# Patient Record
Sex: Female | Born: 1940
Health system: Southern US, Community
[De-identification: ages and names within clinical notes are randomized; demographics above are authoritative.]

## PROBLEM LIST (undated history)

## (undated) DIAGNOSIS — M1990 Unspecified osteoarthritis, unspecified site: Secondary | ICD-10-CM

## (undated) DIAGNOSIS — I10 Essential (primary) hypertension: Secondary | ICD-10-CM

## (undated) HISTORY — PX: DENTAL RESTORATION/EXTRACTION WITH X-RAY: SHX5796

---

## 2004-09-04 ENCOUNTER — Ambulatory Visit (HOSPITAL_COMMUNITY): Admission: RE | Admit: 2004-09-04 | Discharge: 2004-09-04 | Payer: Self-pay | Admitting: General Surgery

## 2005-04-02 ENCOUNTER — Ambulatory Visit (HOSPITAL_COMMUNITY): Admission: RE | Admit: 2005-04-02 | Discharge: 2005-04-02 | Payer: Self-pay | Admitting: General Surgery

## 2005-10-15 ENCOUNTER — Ambulatory Visit (HOSPITAL_COMMUNITY): Admission: RE | Admit: 2005-10-15 | Discharge: 2005-10-15 | Payer: Self-pay | Admitting: General Surgery

## 2006-03-18 ENCOUNTER — Ambulatory Visit (HOSPITAL_COMMUNITY): Admission: RE | Admit: 2006-03-18 | Discharge: 2006-03-18 | Payer: Self-pay | Admitting: General Surgery

## 2014-09-28 NOTE — Patient Instructions (Signed)
Your procedure is scheduled on:  Tuesday, 10/03/14  Report to [facility] at     1230   AM.  Call this number if you have problems the morning of surgery: [phone]   Remember:   Do not eat or drink   After Midnight.  Take these medicines the morning of surgery with A SIP OF WATER: Ziac and xanax   Do not wear jewelry, make-up or nail polish.  Do not wear lotions, powders, or perfumes. You may wear deodorant.  Do not bring valuables to the hospital.  Contacts, dentures or bridgework may not be worn into surgery.     Patients discharged the day of surgery will not be allowed to drive home.  Name and phone number of your driver: family  Special Instructions: Use eye drops as directed.   Please read over the following fact sheets that you were given: Pain Booklet, Anesthesia Post-op Instructions and Care and Recovery After Surgery    Cataract Surgery  A cataract is a clouding of the lens of the eye. When a lens becomes cloudy, vision is reduced based on the degree and nature of the clouding. Surgery may be needed to improve vision. Surgery removes the cloudy lens and usually replaces it with a substitute lens (intraocular lens, IOL). LET YOUR EYE DOCTOR KNOW ABOUT:  Allergies to food or medicine.   Medicines taken including herbs, eyedrops, over-the-counter medicines, and creams.   Use of steroids (by mouth or creams).   Previous problems with anesthetics or numbing medicine.   History of bleeding problems or blood clots.   Previous surgery.   Other health problems, including diabetes and kidney problems.   Possibility of pregnancy, if this applies.  RISKS AND COMPLICATIONS  Infection.   Inflammation of the eyeball (endophthalmitis) that can spread to both eyes (sympathetic ophthalmia).   Poor wound healing.   If an IOL is inserted, it can later fall out of proper position. This is very uncommon.   Clouding of the part of your eye that holds an IOL in place. This is  called an "after-cataract." These are uncommon, but easily treated.  BEFORE THE PROCEDURE  Do not eat or drink anything except small amounts of water for 8 to 12 before your surgery, or as directed by your caregiver.   Unless you are told otherwise, continue any eyedrops you have been prescribed.   Talk to your primary caregiver about all other medicines that you take (both prescription and non-prescription). In some cases, you may need to stop or change medicines near the time of your surgery. This is most important if you are taking blood-thinning medicine.Do not stop medicines unless you are told to do so.   Arrange for someone to drive you to and from the procedure.   Do not put contact lenses in either eye on the day of your surgery.  PROCEDURE There is more than one method for safely removing a cataract. Your doctor can explain the differences and help determine which is best for you. Phacoemulsification surgery is the most common form of cataract surgery.  An injection is given behind the eye or eyedrops are given to make this a painless procedure.   A small cut (incision) is made on the edge of the clear, dome-shaped surface that covers the front of the eye (cornea).   A tiny probe is painlessly inserted into the eye. This device gives off ultrasound waves that soften and break up the cloudy center of the lens. This  makes it easier for the cloudy lens to be removed by suction.   An IOL may be implanted.   The normal lens of the eye is covered by a clear capsule. Part of that capsule is intentionally left in the eye to support the IOL.   Your surgeon may or may not use stitches to close the incision.  There are other forms of cataract surgery that require a larger incision and stiches to close the eye. This approach is taken in cases where the doctor feels that the cataract cannot be easily removed using phacoemulsification. AFTER THE PROCEDURE  When an IOL is implanted, it  does not need care. It becomes a permanent part of your eye and cannot be seen or felt.   Your doctor will schedule follow-up exams to check on your progress.   Review your other medicines with your doctor to see which can be resumed after surgery.   Use eyedrops or take medicine as prescribed by your doctor.  Document Released: 04/17/2011 Document Reviewed: 04/14/2011 [facility] Patient Information 2012 [place].  PATIENT INSTRUCTIONS POST-ANESTHESIA  IMMEDIATELY FOLLOWING SURGERY:  Do not drive or operate machinery for the first twenty four hours after surgery.  Do not make any important decisions for twenty four hours after surgery or while taking narcotic pain medications or sedatives.  If you develop intractable nausea and vomiting or a severe headache please notify your doctor immediately.  FOLLOW-UP:  Please make an appointment with your surgeon as instructed. You do not need to follow up with anesthesia unless specifically instructed to do so.  WOUND CARE INSTRUCTIONS (if applicable):  Keep a dry clean dressing on the anesthesia/puncture wound site if there is drainage.  Once the wound has quit draining you may leave it open to air.  Generally you should leave the bandage intact for twenty four hours unless there is drainage.  If the epidural site drains for more than 36-48 hours please call the anesthesia department.  QUESTIONS?:  Please feel free to call your physician or the hospital operator if you have any questions, and they will be happy to assist you.

## 2014-09-29 ENCOUNTER — Other Ambulatory Visit: Payer: Self-pay

## 2014-09-29 ENCOUNTER — Encounter (HOSPITAL_COMMUNITY)
Admission: RE | Admit: 2014-09-29 | Discharge: 2014-09-29 | Disposition: A | Discharge status: Home / Self Care | Payer: Medicare Other | Source: Ambulatory Visit | Attending: Ophthalmology | Admitting: Ophthalmology

## 2014-09-29 ENCOUNTER — Encounter (HOSPITAL_COMMUNITY): Payer: Self-pay

## 2014-09-29 DIAGNOSIS — Z01810 Encounter for preprocedural cardiovascular examination: Secondary | ICD-10-CM | POA: Diagnosis not present

## 2014-09-29 DIAGNOSIS — H269 Unspecified cataract: Secondary | ICD-10-CM | POA: Insufficient documentation

## 2014-09-29 DIAGNOSIS — Z01812 Encounter for preprocedural laboratory examination: Secondary | ICD-10-CM | POA: Diagnosis present

## 2014-09-29 HISTORY — DX: Essential (primary) hypertension: I10

## 2014-09-29 LAB — BASIC METABOLIC PANEL
Anion gap: 7 (ref 5–15)
BUN: 12 mg/dL (ref 6–20)
CO2: 30 mmol/L (ref 22–32)
Calcium: 9 mg/dL (ref 8.9–10.3)
Chloride: 98 mmol/L — ABNORMAL LOW (ref 101–111)
Creatinine, Ser: 0.71 mg/dL (ref 0.44–1.00)
GFR calc Af Amer: >60 mL/min (ref >60)
GFR calc non Af Amer: >60 mL/min (ref >60)
Glucose, Bld: 108 mg/dL — ABNORMAL HIGH (ref 65–99)
Potassium: 4.1 mmol/L (ref 3.5–5.1)
Sodium: 135 mmol/L (ref 135–145)

## 2014-09-29 LAB — CBC
HCT: 42 % (ref 36.0–46.0)
Hemoglobin: 14.1 g/dL (ref 12.0–15.0)
MCH: 30.5 pg (ref 26.0–34.0)
MCHC: 33.6 g/dL (ref 30.0–36.0)
MCV: 90.9 fL (ref 78.0–100.0)
Platelets: 177 10*3/uL (ref 150–400)
RBC: 4.62 MIL/uL (ref 3.87–5.11)
RDW: 12.5 % (ref 11.5–15.5)
WBC: 4.9 10*3/uL (ref 4.0–10.5)

## 2014-10-02 MED ORDER — KETOROLAC TROMETHAMINE 0.5 % OP SOLN
OPHTHALMIC | Status: AC
  Filled 2014-10-02: qty 5

## 2014-10-02 MED ORDER — LIDOCAINE HCL 3.5 % OP GEL: 1.0000 "application " | Freq: Once | OPHTHALMIC | Status: DC

## 2014-10-02 MED ORDER — TETRACAINE HCL 0.5 % OP SOLN
OPHTHALMIC | Status: AC
  Filled 2014-10-02: qty 2

## 2014-10-02 MED ORDER — CYCLOPENTOLATE-PHENYLEPHRINE OP SOLN OPTIME - NO CHARGE
OPHTHALMIC | Status: AC
  Filled 2014-10-02: qty 2

## 2014-10-02 MED ORDER — PHENYLEPHRINE HCL 2.5 % OP SOLN
OPHTHALMIC | Status: AC
  Filled 2014-10-02: qty 15

## 2014-10-03 ENCOUNTER — Ambulatory Visit (HOSPITAL_COMMUNITY): Payer: Medicare Other | Admitting: Anesthesiology

## 2014-10-03 ENCOUNTER — Ambulatory Visit (HOSPITAL_COMMUNITY)
Admission: RE | Admit: 2014-10-03 | Discharge: 2014-10-03 | Disposition: A | Discharge status: Home / Self Care | Payer: Medicare Other | Source: Ambulatory Visit | Attending: Ophthalmology | Admitting: Ophthalmology

## 2014-10-03 ENCOUNTER — Encounter (HOSPITAL_COMMUNITY)
Admission: RE | Disposition: A | Discharge status: Home / Self Care | Payer: Self-pay | Source: Ambulatory Visit | Attending: Ophthalmology

## 2014-10-03 ENCOUNTER — Encounter (HOSPITAL_COMMUNITY): Payer: Self-pay | Admitting: *Deleted

## 2014-10-03 DIAGNOSIS — K219 Gastro-esophageal reflux disease without esophagitis: Secondary | ICD-10-CM | POA: Insufficient documentation

## 2014-10-03 DIAGNOSIS — H25011 Cortical age-related cataract, right eye: Secondary | ICD-10-CM | POA: Diagnosis not present

## 2014-10-03 DIAGNOSIS — M1990 Unspecified osteoarthritis, unspecified site: Secondary | ICD-10-CM | POA: Insufficient documentation

## 2014-10-03 DIAGNOSIS — H2511 Age-related nuclear cataract, right eye: Secondary | ICD-10-CM | POA: Diagnosis not present

## 2014-10-03 DIAGNOSIS — Z79899 Other long term (current) drug therapy: Secondary | ICD-10-CM | POA: Diagnosis not present

## 2014-10-03 DIAGNOSIS — I10 Essential (primary) hypertension: Secondary | ICD-10-CM | POA: Diagnosis not present

## 2014-10-03 HISTORY — PX: CATARACT EXTRACTION W/PHACO: SHX586

## 2014-10-03 SURGERY — PHACOEMULSIFICATION, CATARACT, WITH IOL INSERTION
Anesthesia: Monitor Anesthesia Care | Site: Eye | Laterality: Right

## 2014-10-03 MED ORDER — BSS IO SOLN
INTRAOCULAR | Status: DC | PRN
  Administered 2014-10-03: 15 mL

## 2014-10-03 MED ORDER — MIDAZOLAM HCL 2 MG/2ML IJ SOLN
1.0000 mg | INTRAMUSCULAR | Status: DC | PRN
  Administered 2014-10-03: 2 mg via INTRAVENOUS

## 2014-10-03 MED ORDER — CYCLOPENTOLATE-PHENYLEPHRINE 0.2-1 % OP SOLN
1.0000 [drp] | OPHTHALMIC | Status: AC
  Administered 2014-10-03 (×3): 1 [drp] via OPHTHALMIC

## 2014-10-03 MED ORDER — MIDAZOLAM HCL 2 MG/2ML IJ SOLN
INTRAMUSCULAR | Status: AC
  Filled 2014-10-03: qty 2

## 2014-10-03 MED ORDER — TETRACAINE 0.5 % OP SOLN OPTIME - NO CHARGE
OPHTHALMIC | Status: DC | PRN
  Administered 2014-10-03: 1 [drp] via OPHTHALMIC

## 2014-10-03 MED ORDER — LACTATED RINGERS IV SOLN
INTRAVENOUS | Status: DC
  Administered 2014-10-03: 08:00:00 via INTRAVENOUS

## 2014-10-03 MED ORDER — FENTANYL CITRATE (PF) 100 MCG/2ML IJ SOLN
INTRAMUSCULAR | Status: AC
  Filled 2014-10-03: qty 2

## 2014-10-03 MED ORDER — FENTANYL CITRATE (PF) 100 MCG/2ML IJ SOLN
25.0000 ug | INTRAMUSCULAR | Status: AC
  Administered 2014-10-03 (×2): 25 ug via INTRAVENOUS

## 2014-10-03 MED ORDER — EPINEPHRINE HCL 1 MG/ML IJ SOLN
INTRAMUSCULAR | Status: AC
  Filled 2014-10-03: qty 1

## 2014-10-03 MED ORDER — PROVISC 10 MG/ML IO SOLN
INTRAOCULAR | Status: DC | PRN
  Administered 2014-10-03: 0.85 mL via INTRAOCULAR

## 2014-10-03 MED ORDER — KETOROLAC TROMETHAMINE 0.5 % OP SOLN
1.0000 [drp] | OPHTHALMIC | Status: AC
  Administered 2014-10-03 (×3): 1 [drp] via OPHTHALMIC

## 2014-10-03 MED ORDER — EPINEPHRINE HCL 1 MG/ML IJ SOLN
INTRAOCULAR | Status: DC | PRN
  Administered 2014-10-03: 500 mL

## 2014-10-03 MED ORDER — MIDAZOLAM HCL 5 MG/5ML IJ SOLN
INTRAMUSCULAR | Status: DC | PRN
  Administered 2014-10-03: 1 mg via INTRAVENOUS
  Administered 2014-10-03: .5 mg via INTRAVENOUS

## 2014-10-03 MED ORDER — TETRACAINE HCL 0.5 % OP SOLN
1.0000 [drp] | OPHTHALMIC | Status: AC
  Administered 2014-10-03 (×3): 1 [drp] via OPHTHALMIC

## 2014-10-03 MED ORDER — PHENYLEPHRINE HCL 2.5 % OP SOLN
1.0000 [drp] | OPHTHALMIC | Status: AC
Start: 2014-10-03 — End: 2014-10-03
  Administered 2014-10-03 (×3): 1 [drp] via OPHTHALMIC

## 2014-10-03 SURGICAL SUPPLY — 10 items
CLOTH BEACON ORANGE TIMEOUT ST (SAFETY) ×2 IMPLANT
EYE SHIELD UNIVERSAL CLEAR (GAUZE/BANDAGES/DRESSINGS) ×2 IMPLANT
GLOVE BIO SURGEON STRL SZ 6.5 (GLOVE) ×1 IMPLANT
GLOVE BIO SURGEONS STRL SZ 6.5 (GLOVE) ×1
GLOVE EXAM NITRILE MD LF STRL (GLOVE) ×2 IMPLANT
LENS ALC ACRYL/TECN (Ophthalmic Related) ×3 IMPLANT
PAD ARMBOARD 7.5X6 YLW CONV (MISCELLANEOUS) ×2 IMPLANT
TAPE SURG TRANSPORE 1 IN (GAUZE/BANDAGES/DRESSINGS) IMPLANT
TAPE SURGICAL TRANSPORE 1 IN (GAUZE/BANDAGES/DRESSINGS) ×2
WATER STERILE IRR 250ML POUR (IV SOLUTION) ×2 IMPLANT

## 2014-10-03 NOTE — H&P (Signed)
The patient was re examined and there is no change in the patients condition since the original H and P. 

## 2014-10-03 NOTE — Transfer of Care (Signed)
Immediate Anesthesia Transfer of Care Note  Patient: Elizabeth Norman  Procedure(s) Performed: Procedure(s) with comments: CATARACT EXTRACTION PHACO AND INTRAOCULAR LENS PLACEMENT (IOC) (Right) - CDE:10.09  Patient Location: Short Stay  Anesthesia Type:MAC  Level of Consciousness: awake  Airway & Oxygen Therapy: Patient Spontanous Breathing  Post-op Assessment: Report given to RN  Post vital signs: Reviewed  Last Vitals:  Filed Vitals:   10/03/14 0741  BP: 131/73  Pulse: 68  Temp: 95.3 C    Complications: No apparent anesthesia complications

## 2014-10-03 NOTE — Discharge Instructions (Signed)
Elizabeth Norman  10/03/2014           [facility] Instructions Roundup [address]      1. Avoid closing eyes tightly. One often closes the eye tightly when laughing, talking, sneezing, coughing or if they feel irritated. At these times, you should be careful not to close your eyes tightly.  2. Instill eye drops as instructed. To instill drops in your eye, open it, look up and have someone gently pull the lower lid down and instill a couple of drops inside the lower lid.  3. Do not touch upper lid.  4. Take Advil or Tylenol for pain.  5. You may use either eye for near work, such as reading or sewing and you may watch television.  6. You may have your hair done at the beauty parlor at any time.  7. Wear dark glasses with or without your own glasses if you are in bright light.  8. Call our office at [phone] or [phone] if you have sharp pain in your eye or unusual symptoms.  9. Do not be concerned because vision in the operative eye is not good. It will not be good, no matter how successful the operation, until you get a special lens for it. Your old glasses will not be suited to the new eye that was operated on and you will not be ready for a new lens for about a month.  10. Follow up at the [facility] office.    I have received a copy of the above instructions and will follow them.        PATIENT INSTRUCTIONS POST-ANESTHESIA  IMMEDIATELY FOLLOWING SURGERY:  Do not drive or operate machinery for the first twenty four hours after surgery.  Do not make any important decisions for twenty four hours after surgery or while taking narcotic pain medications or sedatives.  If you develop intractable nausea and vomiting or a severe headache please notify your doctor immediately.  FOLLOW-UP:  Please make an appointment with your surgeon as instructed. You do not need to follow up with anesthesia unless specifically instructed to do  so.  WOUND CARE INSTRUCTIONS (if applicable):  Keep a dry clean dressing on the anesthesia/puncture wound site if there is drainage.  Once the wound has quit draining you may leave it open to air.  Generally you should leave the bandage intact for twenty four hours unless there is drainage.  If the epidural site drains for more than 36-48 hours please call the anesthesia department.  QUESTIONS?:  Please feel free to call your physician or the hospital operator if you have any questions, and they will be happy to assist you.

## 2014-10-03 NOTE — Anesthesia Preprocedure Evaluation (Signed)
Anesthesia Evaluation  Patient identified by MRN, date of birth, ID band Patient awake    Reviewed: Allergy & Precautions, NPO status , Patient's Chart, lab work & pertinent test results  Airway Mallampati: II  TM Distance: >3 FB     Dental  (+) Edentulous Upper   Pulmonary neg pulmonary ROS,  breath sounds clear to auscultation        Cardiovascular hypertension, Pt. on medications Rhythm:Regular Rate:Normal     Neuro/Psych Anxiety    GI/Hepatic   Endo/Other    Renal/GU      Musculoskeletal   Abdominal   Peds  Hematology   Anesthesia Other Findings   Reproductive/Obstetrics                             Anesthesia Physical Anesthesia Plan  ASA: II  Anesthesia Plan: MAC   Post-op Pain Management:    Induction: Intravenous  Airway Management Planned: Nasal Cannula  Additional Equipment:   Intra-op Plan:   Post-operative Plan:   Informed Consent: I have reviewed the patients History and Physical, chart, labs and discussed the procedure including the risks, benefits and alternatives for the proposed anesthesia with the patient or authorized representative who has indicated his/her understanding and acceptance.     Plan Discussed with:   Anesthesia Plan Comments:         Anesthesia Quick Evaluation

## 2014-10-03 NOTE — Anesthesia Postprocedure Evaluation (Signed)
  Anesthesia Post-op Note  Patient: Elizabeth Norman  Procedure(s) Performed: Procedure(s) with comments: CATARACT EXTRACTION PHACO AND INTRAOCULAR LENS PLACEMENT (IOC) (Right) - CDE:10.09  Patient Location: Short Stay  Anesthesia Type:MAC  Level of Consciousness: awake, alert  and oriented  Airway and Oxygen Therapy: Patient Spontanous Breathing  Post-op Pain: none  Post-op Assessment: Post-op Vital signs reviewed, Patient's Cardiovascular Status Stable, Respiratory Function Stable, Patent Airway and No signs of Nausea or vomiting  Post-op Vital Signs: Reviewed and stable  Last Vitals:  Filed Vitals:   10/03/14 0741  BP: 131/73  Pulse: 68  Temp: 32.4 C    Complications: No apparent anesthesia complications

## 2014-10-03 NOTE — Op Note (Signed)
Patient brought to the operating room and prepped and draped in the usual manner.  Lid speculum inserted in right eye.  Stab incision made at the twelve o'clock position.  Provisc instilled in the anterior chamber.   A 2.4 mm. Stab incision was made temporally.  An anterior capsulotomy was done with a bent 25 gauge needle.  The nucleus was hydrodissected.  The Phaco tip was inserted in the anterior chamber and the nucleus was emulsified.  CDE was 10.09.  The cortical material was then removed with the I and A tip.  Posterior capsule was the polished.  The anterior chamber was deepened with Provisc.  A 21.0 Diopter Alcon [id number] IOL was then inserted in the capsular bag.  Provisc was then removed with the I and A tip.  The wound was then hydrated.  Patient sent to the Recovery Room in good condition with follow up in my office.  Preoperative Diagnosis:  Nuclear Cataract OD Postoperative Diagnosis:  Same Procedure name: Kelman Phacoemulsification OD with IOL

## 2014-10-04 ENCOUNTER — Encounter (HOSPITAL_COMMUNITY): Payer: Self-pay | Admitting: Ophthalmology

## 2014-10-05 ENCOUNTER — Encounter (HOSPITAL_COMMUNITY): Payer: Self-pay

## 2014-10-05 ENCOUNTER — Encounter (HOSPITAL_COMMUNITY)
Admission: RE | Admit: 2014-10-05 | Discharge: 2014-10-05 | Disposition: A | Discharge status: Home / Self Care | Payer: Medicare Other | Source: Ambulatory Visit | Attending: Ophthalmology | Admitting: Ophthalmology

## 2014-10-06 ENCOUNTER — Encounter (HOSPITAL_COMMUNITY)
Admission: RE | Admit: 2014-10-06 | Discharge: 2014-10-06 | Disposition: A | Discharge status: Home / Self Care | Payer: Medicare Other | Source: Ambulatory Visit | Attending: Ophthalmology | Admitting: Ophthalmology

## 2014-10-10 ENCOUNTER — Ambulatory Visit (HOSPITAL_COMMUNITY): Payer: Medicare Other | Admitting: Anesthesiology

## 2014-10-10 ENCOUNTER — Encounter (HOSPITAL_COMMUNITY): Payer: Self-pay | Admitting: *Deleted

## 2014-10-10 ENCOUNTER — Encounter (HOSPITAL_COMMUNITY)
Admission: RE | Disposition: A | Discharge status: Home / Self Care | Payer: Self-pay | Source: Ambulatory Visit | Attending: Ophthalmology

## 2014-10-10 ENCOUNTER — Ambulatory Visit (HOSPITAL_COMMUNITY)
Admission: RE | Admit: 2014-10-10 | Discharge: 2014-10-10 | Disposition: A | Discharge status: Home / Self Care | Payer: Medicare Other | Source: Ambulatory Visit | Attending: Ophthalmology | Admitting: Ophthalmology

## 2014-10-10 DIAGNOSIS — H2512 Age-related nuclear cataract, left eye: Secondary | ICD-10-CM | POA: Insufficient documentation

## 2014-10-10 HISTORY — PX: CATARACT EXTRACTION W/PHACO: SHX586

## 2014-10-10 SURGERY — PHACOEMULSIFICATION, CATARACT, WITH IOL INSERTION
Anesthesia: Monitor Anesthesia Care | Site: Eye | Laterality: Left

## 2014-10-10 MED ORDER — PHENYLEPHRINE HCL 2.5 % OP SOLN
1.0000 [drp] | OPHTHALMIC | Status: AC
  Administered 2014-10-10 (×3): 1 [drp] via OPHTHALMIC

## 2014-10-10 MED ORDER — MIDAZOLAM HCL 2 MG/2ML IJ SOLN
1.0000 mg | INTRAMUSCULAR | Status: DC | PRN
  Administered 2014-10-10 (×2): 2 mg via INTRAVENOUS
  Filled 2014-10-10: qty 2

## 2014-10-10 MED ORDER — TETRACAINE HCL 0.5 % OP SOLN
1.0000 [drp] | OPHTHALMIC | Status: AC
  Administered 2014-10-10 (×3): 1 [drp] via OPHTHALMIC

## 2014-10-10 MED ORDER — MIDAZOLAM HCL 2 MG/2ML IJ SOLN
INTRAMUSCULAR | Status: AC
  Filled 2014-10-10: qty 2

## 2014-10-10 MED ORDER — TETRACAINE 0.5 % OP SOLN OPTIME - NO CHARGE
OPHTHALMIC | Status: DC | PRN
  Administered 2014-10-10: 1 [drp] via OPHTHALMIC

## 2014-10-10 MED ORDER — KETOROLAC TROMETHAMINE 0.5 % OP SOLN
1.0000 [drp] | OPHTHALMIC | Status: AC
  Administered 2014-10-10 (×3): 1 [drp] via OPHTHALMIC

## 2014-10-10 MED ORDER — CYCLOPENTOLATE-PHENYLEPHRINE 0.2-1 % OP SOLN
1.0000 [drp] | OPHTHALMIC | Status: AC
  Administered 2014-10-10 (×3): 1 [drp] via OPHTHALMIC

## 2014-10-10 MED ORDER — PROVISC 10 MG/ML IO SOLN
INTRAOCULAR | Status: DC | PRN
  Administered 2014-10-10: 0.85 mL via INTRAOCULAR

## 2014-10-10 MED ORDER — FENTANYL CITRATE (PF) 100 MCG/2ML IJ SOLN
INTRAMUSCULAR | Status: AC
Start: 2014-10-10 — End: 2014-10-10
  Filled 2014-10-10: qty 2

## 2014-10-10 MED ORDER — EPINEPHRINE HCL 1 MG/ML IJ SOLN
INTRAOCULAR | Status: DC | PRN
  Administered 2014-10-10: 500 mL

## 2014-10-10 MED ORDER — LACTATED RINGERS IV SOLN
INTRAVENOUS | Status: DC
  Administered 2014-10-10: 1000 mL via INTRAVENOUS

## 2014-10-10 MED ORDER — FENTANYL CITRATE (PF) 100 MCG/2ML IJ SOLN
25.0000 ug | INTRAMUSCULAR | Status: AC
  Administered 2014-10-10 (×2): 25 ug via INTRAVENOUS

## 2014-10-10 MED ORDER — LIDOCAINE HCL (PF) 1 % IJ SOLN
INTRAMUSCULAR | Status: AC
  Filled 2014-10-10: qty 2

## 2014-10-10 MED ORDER — EPINEPHRINE HCL 1 MG/ML IJ SOLN
INTRAMUSCULAR | Status: AC
  Filled 2014-10-10: qty 1

## 2014-10-10 MED ORDER — BSS IO SOLN
INTRAOCULAR | Status: DC | PRN
  Administered 2014-10-10: 15 mL

## 2014-10-10 SURGICAL SUPPLY — 10 items
CLOTH BEACON ORANGE TIMEOUT ST (SAFETY) ×2 IMPLANT
EYE SHIELD UNIVERSAL CLEAR (GAUZE/BANDAGES/DRESSINGS) ×2 IMPLANT
GLOVE BIO SURGEON STRL SZ 6.5 (GLOVE) ×1 IMPLANT
GLOVE BIO SURGEONS STRL SZ 6.5 (GLOVE) ×1
GLOVE EXAM NITRILE MD LF STRL (GLOVE) ×2 IMPLANT
LENS ALC ACRYL/TECN (Ophthalmic Related) ×3 IMPLANT
PAD ARMBOARD 7.5X6 YLW CONV (MISCELLANEOUS) ×2 IMPLANT
TAPE SURG TRANSPORE 1 IN (GAUZE/BANDAGES/DRESSINGS) IMPLANT
TAPE SURGICAL TRANSPORE 1 IN (GAUZE/BANDAGES/DRESSINGS) ×2
WATER STERILE IRR 250ML POUR (IV SOLUTION) ×2 IMPLANT

## 2014-10-10 NOTE — Discharge Instructions (Signed)
Elizabeth Norman  10/10/2014           [facility] Instructions [place] [address]      1. Avoid closing eyes tightly. One often closes the eye tightly when laughing, talking, sneezing, coughing or if they feel irritated. At these times, you should be careful not to close your eyes tightly.  2. Instill eye drops as instructed. To instill drops in your eye, open it, look up and have someone gently pull the lower lid down and instill a couple of drops inside the lower lid.  3. Do not touch upper lid.  4. Take Advil or Tylenol for pain.  5. You may use either eye for near work, such as reading or sewing and you may watch television.  6. You may have your hair done at the beauty parlor at any time.  7. Wear dark glasses with or without your own glasses if you are in bright light.  8. Call our office at [phone] or [phone] if you have sharp pain in your eye or unusual symptoms.  9. Do not be concerned because vision in the operative eye is not good. It will not be good, no matter how successful the operation, until you get a special lens for it. Your old glasses will not be suited to the new eye that was operated on and you will not be ready for a new lens for about a month.  10. Follow up at the [facility] office.    I have received a copy of the above instructions and will follow them.

## 2014-10-10 NOTE — Addendum Note (Signed)
Addendum  created 10/10/14 0803 by Ollen Bowl, CRNA   Modules edited: Anesthesia Attestations

## 2014-10-10 NOTE — Anesthesia Postprocedure Evaluation (Signed)
  Anesthesia Post-op Note  Patient: Elizabeth Norman  Procedure(s) Performed: Procedure(s) with comments: CATARACT EXTRACTION PHACO AND INTRAOCULAR LENS PLACEMENT (IOC) (Left) - CDE:8.69  Patient Location: PACU  Anesthesia Type:MAC  Level of Consciousness: awake, alert  and oriented  Airway and Oxygen Therapy: Patient Spontanous Breathing  Post-op Pain: none  Post-op Assessment: Post-op Vital signs reviewed, Patient's Cardiovascular Status Stable, Respiratory Function Stable, Patent Airway and No signs of Nausea or vomiting  Post-op Vital Signs: Reviewed and stable  Last Vitals:  Filed Vitals:   10/10/14 0720  BP: 109/57  Resp: 12    Complications: No apparent anesthesia complications

## 2014-10-10 NOTE — Transfer of Care (Signed)
Immediate Anesthesia Transfer of Care Note  Patient: Elizabeth Norman  Procedure(s) Performed: Procedure(s) with comments: CATARACT EXTRACTION PHACO AND INTRAOCULAR LENS PLACEMENT (IOC) (Left) - CDE:8.69  Patient Location: Short Stay  Anesthesia Type:MAC  Level of Consciousness: awake  Airway & Oxygen Therapy: Patient Spontanous Breathing  Post-op Assessment: Report given to RN  Post vital signs: Reviewed  Last Vitals:  Filed Vitals:   10/10/14 0720  BP: 109/57  Resp: 12    Complications: No apparent anesthesia complications

## 2014-10-10 NOTE — Op Note (Signed)
Patient brought to the operating room and prepped and draped in the usual manner.  Lid speculum inserted in left eye.  Stab incision made at the twelve o'clock position.  Provisc instilled in the anterior chamber.   A 2.4 mm. Stab incision was made temporally.  An anterior capsulotomy was done with a bent 25 gauge needle.  The nucleus was hydrodissected.  The Phaco tip was inserted in the anterior chamber and the nucleus was emulsified.  CDE was 8.69.  The cortical material was then removed with the I and A tip.  Posterior capsule was the polished.  The anterior chamber was deepened with Provisc.  A 21.0 Diopter Alcon SN60WF IOL was then inserted in the capsular bag.  Provisc was then removed with the I and A tip.  The wound was then hydrated.  Patient sent to the Recovery Room in good condition with follow up in my office.  Preoperative Diagnosis:  Nuclear Cataract OS Postoperative Diagnosis:  Same Procedure name: Kelman Phacoemulsification OS with IOL

## 2014-10-10 NOTE — H&P (Signed)
The patient was re examined and there is no change in the patients condition since the original H and P. 

## 2014-10-10 NOTE — Anesthesia Preprocedure Evaluation (Signed)
Anesthesia Evaluation  Patient identified by MRN, date of birth, ID band Patient awake    Reviewed: Allergy & Precautions, NPO status , Patient's Chart, lab work & pertinent test results  Airway Mallampati: II  TM Distance: >3 FB     Dental  (+) Edentulous Upper   Pulmonary neg pulmonary ROS,  breath sounds clear to auscultation        Cardiovascular hypertension, Pt. on medications Rhythm:Regular Rate:Normal     Neuro/Psych Anxiety    GI/Hepatic   Endo/Other    Renal/GU      Musculoskeletal   Abdominal   Peds  Hematology   Anesthesia Other Findings   Reproductive/Obstetrics                             Anesthesia Physical Anesthesia Plan  ASA: II  Anesthesia Plan: MAC   Post-op Pain Management:    Induction: Intravenous  Airway Management Planned: Nasal Cannula  Additional Equipment:   Intra-op Plan:   Post-operative Plan:   Informed Consent: I have reviewed the patients History and Physical, chart, labs and discussed the procedure including the risks, benefits and alternatives for the proposed anesthesia with the patient or authorized representative who has indicated his/her understanding and acceptance.     Plan Discussed with:   Anesthesia Plan Comments:         Anesthesia Quick Evaluation

## 2014-10-11 ENCOUNTER — Encounter (HOSPITAL_COMMUNITY): Payer: Self-pay | Admitting: Ophthalmology

## 2014-11-03 ENCOUNTER — Other Ambulatory Visit (HOSPITAL_COMMUNITY): Payer: Self-pay

## 2015-05-22 DIAGNOSIS — Z0000 Encounter for general adult medical examination without abnormal findings: Secondary | ICD-10-CM | POA: Diagnosis not present

## 2015-05-22 DIAGNOSIS — Z1389 Encounter for screening for other disorder: Secondary | ICD-10-CM | POA: Diagnosis not present

## 2015-05-22 DIAGNOSIS — I10 Essential (primary) hypertension: Secondary | ICD-10-CM | POA: Diagnosis not present

## 2015-05-22 DIAGNOSIS — M810 Age-related osteoporosis without current pathological fracture: Secondary | ICD-10-CM | POA: Diagnosis not present

## 2015-06-01 DIAGNOSIS — Z1231 Encounter for screening mammogram for malignant neoplasm of breast: Secondary | ICD-10-CM | POA: Diagnosis not present

## 2015-08-27 DIAGNOSIS — M810 Age-related osteoporosis without current pathological fracture: Secondary | ICD-10-CM | POA: Diagnosis not present

## 2015-08-27 DIAGNOSIS — Z0000 Encounter for general adult medical examination without abnormal findings: Secondary | ICD-10-CM | POA: Diagnosis not present

## 2015-08-27 DIAGNOSIS — I10 Essential (primary) hypertension: Secondary | ICD-10-CM | POA: Diagnosis not present

## 2015-11-27 DIAGNOSIS — Z6834 Body mass index (BMI) 34.0-34.9, adult: Secondary | ICD-10-CM | POA: Diagnosis not present

## 2015-11-27 DIAGNOSIS — Z131 Encounter for screening for diabetes mellitus: Secondary | ICD-10-CM | POA: Diagnosis not present

## 2015-11-27 DIAGNOSIS — M80852D Other osteoporosis with current pathological fracture, left femur, subsequent encounter for fracture with routine healing: Secondary | ICD-10-CM | POA: Diagnosis not present

## 2015-11-27 DIAGNOSIS — I10 Essential (primary) hypertension: Secondary | ICD-10-CM | POA: Diagnosis not present

## 2015-11-27 DIAGNOSIS — M810 Age-related osteoporosis without current pathological fracture: Secondary | ICD-10-CM | POA: Diagnosis not present

## 2016-03-04 DIAGNOSIS — M542 Cervicalgia: Secondary | ICD-10-CM | POA: Diagnosis not present

## 2016-03-04 DIAGNOSIS — M810 Age-related osteoporosis without current pathological fracture: Secondary | ICD-10-CM | POA: Diagnosis not present

## 2016-03-04 DIAGNOSIS — I10 Essential (primary) hypertension: Secondary | ICD-10-CM | POA: Diagnosis not present

## 2016-03-04 DIAGNOSIS — M47812 Spondylosis without myelopathy or radiculopathy, cervical region: Secondary | ICD-10-CM | POA: Diagnosis not present

## 2016-05-02 DIAGNOSIS — I10 Essential (primary) hypertension: Secondary | ICD-10-CM | POA: Diagnosis not present

## 2016-05-02 DIAGNOSIS — M810 Age-related osteoporosis without current pathological fracture: Secondary | ICD-10-CM | POA: Diagnosis not present

## 2016-06-03 DIAGNOSIS — M810 Age-related osteoporosis without current pathological fracture: Secondary | ICD-10-CM | POA: Diagnosis not present

## 2016-06-03 DIAGNOSIS — I10 Essential (primary) hypertension: Secondary | ICD-10-CM | POA: Diagnosis not present

## 2016-06-03 DIAGNOSIS — Z1389 Encounter for screening for other disorder: Secondary | ICD-10-CM | POA: Diagnosis not present

## 2016-06-03 DIAGNOSIS — Z0000 Encounter for general adult medical examination without abnormal findings: Secondary | ICD-10-CM | POA: Diagnosis not present

## 2016-06-06 DIAGNOSIS — Z1231 Encounter for screening mammogram for malignant neoplasm of breast: Secondary | ICD-10-CM | POA: Diagnosis not present

## 2016-09-02 DIAGNOSIS — Z0000 Encounter for general adult medical examination without abnormal findings: Secondary | ICD-10-CM | POA: Diagnosis not present

## 2016-09-02 DIAGNOSIS — M818 Other osteoporosis without current pathological fracture: Secondary | ICD-10-CM | POA: Diagnosis not present

## 2016-09-02 DIAGNOSIS — I10 Essential (primary) hypertension: Secondary | ICD-10-CM | POA: Diagnosis not present

## 2016-09-24 DIAGNOSIS — M810 Age-related osteoporosis without current pathological fracture: Secondary | ICD-10-CM | POA: Diagnosis not present

## 2016-09-24 DIAGNOSIS — I10 Essential (primary) hypertension: Secondary | ICD-10-CM | POA: Diagnosis not present

## 2016-09-29 DIAGNOSIS — M8588 Other specified disorders of bone density and structure, other site: Secondary | ICD-10-CM | POA: Diagnosis not present

## 2016-09-29 DIAGNOSIS — M810 Age-related osteoporosis without current pathological fracture: Secondary | ICD-10-CM | POA: Diagnosis not present

## 2016-12-02 DIAGNOSIS — I10 Essential (primary) hypertension: Secondary | ICD-10-CM | POA: Diagnosis not present

## 2016-12-02 DIAGNOSIS — M818 Other osteoporosis without current pathological fracture: Secondary | ICD-10-CM | POA: Diagnosis not present

## 2016-12-02 DIAGNOSIS — M810 Age-related osteoporosis without current pathological fracture: Secondary | ICD-10-CM | POA: Diagnosis not present

## 2017-03-03 DIAGNOSIS — M810 Age-related osteoporosis without current pathological fracture: Secondary | ICD-10-CM | POA: Diagnosis not present

## 2017-03-03 DIAGNOSIS — I10 Essential (primary) hypertension: Secondary | ICD-10-CM | POA: Diagnosis not present

## 2017-03-10 DIAGNOSIS — M810 Age-related osteoporosis without current pathological fracture: Secondary | ICD-10-CM | POA: Diagnosis not present

## 2017-03-10 DIAGNOSIS — I10 Essential (primary) hypertension: Secondary | ICD-10-CM | POA: Diagnosis not present

## 2017-06-02 DIAGNOSIS — Z0000 Encounter for general adult medical examination without abnormal findings: Secondary | ICD-10-CM | POA: Diagnosis not present

## 2017-06-02 DIAGNOSIS — M810 Age-related osteoporosis without current pathological fracture: Secondary | ICD-10-CM | POA: Diagnosis not present

## 2017-06-02 DIAGNOSIS — I10 Essential (primary) hypertension: Secondary | ICD-10-CM | POA: Diagnosis not present

## 2017-06-02 DIAGNOSIS — Z1389 Encounter for screening for other disorder: Secondary | ICD-10-CM | POA: Diagnosis not present

## 2017-06-09 DIAGNOSIS — Z1231 Encounter for screening mammogram for malignant neoplasm of breast: Secondary | ICD-10-CM | POA: Diagnosis not present

## 2017-08-31 DIAGNOSIS — I10 Essential (primary) hypertension: Secondary | ICD-10-CM | POA: Diagnosis not present

## 2017-08-31 DIAGNOSIS — M810 Age-related osteoporosis without current pathological fracture: Secondary | ICD-10-CM | POA: Diagnosis not present

## 2017-11-30 DIAGNOSIS — I10 Essential (primary) hypertension: Secondary | ICD-10-CM | POA: Diagnosis not present

## 2017-11-30 DIAGNOSIS — M810 Age-related osteoporosis without current pathological fracture: Secondary | ICD-10-CM | POA: Diagnosis not present

## 2017-11-30 DIAGNOSIS — Z0000 Encounter for general adult medical examination without abnormal findings: Secondary | ICD-10-CM | POA: Diagnosis not present

## 2018-03-02 DIAGNOSIS — I10 Essential (primary) hypertension: Secondary | ICD-10-CM | POA: Diagnosis not present

## 2018-03-02 DIAGNOSIS — M810 Age-related osteoporosis without current pathological fracture: Secondary | ICD-10-CM | POA: Diagnosis not present

## 2018-05-12 HISTORY — PX: CORNEAL TRANSPLANT: SHX108

## 2018-06-01 DIAGNOSIS — M810 Age-related osteoporosis without current pathological fracture: Secondary | ICD-10-CM | POA: Diagnosis not present

## 2018-06-01 DIAGNOSIS — Z0000 Encounter for general adult medical examination without abnormal findings: Secondary | ICD-10-CM | POA: Diagnosis not present

## 2018-06-01 DIAGNOSIS — I10 Essential (primary) hypertension: Secondary | ICD-10-CM | POA: Diagnosis not present

## 2018-06-01 DIAGNOSIS — Z1389 Encounter for screening for other disorder: Secondary | ICD-10-CM | POA: Diagnosis not present

## 2018-09-01 DIAGNOSIS — Z0000 Encounter for general adult medical examination without abnormal findings: Secondary | ICD-10-CM | POA: Diagnosis not present

## 2018-09-01 DIAGNOSIS — M810 Age-related osteoporosis without current pathological fracture: Secondary | ICD-10-CM | POA: Diagnosis not present

## 2018-09-01 DIAGNOSIS — I10 Essential (primary) hypertension: Secondary | ICD-10-CM | POA: Diagnosis not present

## 2018-10-05 DIAGNOSIS — Z1231 Encounter for screening mammogram for malignant neoplasm of breast: Secondary | ICD-10-CM | POA: Diagnosis not present

## 2018-11-01 DIAGNOSIS — H1851 Endothelial corneal dystrophy: Secondary | ICD-10-CM | POA: Diagnosis not present

## 2018-11-01 DIAGNOSIS — Z961 Presence of intraocular lens: Secondary | ICD-10-CM | POA: Diagnosis not present

## 2018-12-01 DIAGNOSIS — I10 Essential (primary) hypertension: Secondary | ICD-10-CM | POA: Diagnosis not present

## 2018-12-01 DIAGNOSIS — M810 Age-related osteoporosis without current pathological fracture: Secondary | ICD-10-CM | POA: Diagnosis not present

## 2018-12-03 DIAGNOSIS — Z961 Presence of intraocular lens: Secondary | ICD-10-CM | POA: Diagnosis not present

## 2018-12-03 DIAGNOSIS — H1851 Endothelial corneal dystrophy: Secondary | ICD-10-CM | POA: Diagnosis not present

## 2018-12-03 DIAGNOSIS — Z87828 Personal history of other (healed) physical injury and trauma: Secondary | ICD-10-CM | POA: Diagnosis not present

## 2019-01-12 DIAGNOSIS — Z961 Presence of intraocular lens: Secondary | ICD-10-CM | POA: Diagnosis not present

## 2019-01-12 DIAGNOSIS — H1820 Unspecified corneal edema: Secondary | ICD-10-CM | POA: Diagnosis not present

## 2019-01-12 DIAGNOSIS — H1851 Endothelial corneal dystrophy: Secondary | ICD-10-CM | POA: Diagnosis not present

## 2019-02-23 DIAGNOSIS — Z961 Presence of intraocular lens: Secondary | ICD-10-CM | POA: Diagnosis not present

## 2019-02-23 DIAGNOSIS — H1820 Unspecified corneal edema: Secondary | ICD-10-CM | POA: Diagnosis not present

## 2019-02-23 DIAGNOSIS — H18519 Endothelial corneal dystrophy, unspecified eye: Secondary | ICD-10-CM | POA: Diagnosis not present

## 2019-03-02 DIAGNOSIS — M810 Age-related osteoporosis without current pathological fracture: Secondary | ICD-10-CM | POA: Diagnosis not present

## 2019-03-02 DIAGNOSIS — I10 Essential (primary) hypertension: Secondary | ICD-10-CM | POA: Diagnosis not present

## 2019-03-29 DIAGNOSIS — H18512 Endothelial corneal dystrophy, left eye: Secondary | ICD-10-CM | POA: Diagnosis not present

## 2019-03-29 DIAGNOSIS — Z01818 Encounter for other preprocedural examination: Secondary | ICD-10-CM | POA: Diagnosis not present

## 2019-03-29 DIAGNOSIS — Z01812 Encounter for preprocedural laboratory examination: Secondary | ICD-10-CM | POA: Diagnosis not present

## 2019-03-29 DIAGNOSIS — Z9842 Cataract extraction status, left eye: Secondary | ICD-10-CM | POA: Diagnosis not present

## 2019-03-29 DIAGNOSIS — Z9841 Cataract extraction status, right eye: Secondary | ICD-10-CM | POA: Diagnosis not present

## 2019-03-29 DIAGNOSIS — Z961 Presence of intraocular lens: Secondary | ICD-10-CM | POA: Diagnosis not present

## 2019-04-05 DIAGNOSIS — H18512 Endothelial corneal dystrophy, left eye: Secondary | ICD-10-CM | POA: Diagnosis not present

## 2019-04-05 DIAGNOSIS — H18519 Endothelial corneal dystrophy, unspecified eye: Secondary | ICD-10-CM | POA: Diagnosis not present

## 2019-04-08 DIAGNOSIS — H18519 Endothelial corneal dystrophy, unspecified eye: Secondary | ICD-10-CM | POA: Diagnosis not present

## 2019-06-06 DIAGNOSIS — M810 Age-related osteoporosis without current pathological fracture: Secondary | ICD-10-CM | POA: Diagnosis not present

## 2019-06-06 DIAGNOSIS — Z0000 Encounter for general adult medical examination without abnormal findings: Secondary | ICD-10-CM | POA: Diagnosis not present

## 2019-06-06 DIAGNOSIS — Z1389 Encounter for screening for other disorder: Secondary | ICD-10-CM | POA: Diagnosis not present

## 2019-06-06 DIAGNOSIS — I10 Essential (primary) hypertension: Secondary | ICD-10-CM | POA: Diagnosis not present

## 2019-07-19 DIAGNOSIS — M859 Disorder of bone density and structure, unspecified: Secondary | ICD-10-CM | POA: Diagnosis not present

## 2019-07-19 DIAGNOSIS — M810 Age-related osteoporosis without current pathological fracture: Secondary | ICD-10-CM | POA: Diagnosis not present

## 2019-07-20 DIAGNOSIS — H18519 Endothelial corneal dystrophy, unspecified eye: Secondary | ICD-10-CM | POA: Diagnosis not present

## 2019-07-20 DIAGNOSIS — H1820 Unspecified corneal edema: Secondary | ICD-10-CM | POA: Diagnosis not present

## 2019-07-20 DIAGNOSIS — Z961 Presence of intraocular lens: Secondary | ICD-10-CM | POA: Diagnosis not present

## 2019-07-20 DIAGNOSIS — Z87828 Personal history of other (healed) physical injury and trauma: Secondary | ICD-10-CM | POA: Diagnosis not present

## 2019-07-20 DIAGNOSIS — Z947 Corneal transplant status: Secondary | ICD-10-CM | POA: Diagnosis not present

## 2019-07-22 DIAGNOSIS — M85852 Other specified disorders of bone density and structure, left thigh: Secondary | ICD-10-CM | POA: Diagnosis not present

## 2019-09-05 DIAGNOSIS — I10 Essential (primary) hypertension: Secondary | ICD-10-CM | POA: Diagnosis not present

## 2019-09-05 DIAGNOSIS — M810 Age-related osteoporosis without current pathological fracture: Secondary | ICD-10-CM | POA: Diagnosis not present

## 2019-09-05 DIAGNOSIS — Z0000 Encounter for general adult medical examination without abnormal findings: Secondary | ICD-10-CM | POA: Diagnosis not present

## 2019-11-07 DIAGNOSIS — Z961 Presence of intraocular lens: Secondary | ICD-10-CM | POA: Diagnosis not present

## 2019-11-07 DIAGNOSIS — Z947 Corneal transplant status: Secondary | ICD-10-CM | POA: Diagnosis not present

## 2019-11-07 DIAGNOSIS — H18519 Endothelial corneal dystrophy, unspecified eye: Secondary | ICD-10-CM | POA: Diagnosis not present

## 2019-12-12 DIAGNOSIS — Z0000 Encounter for general adult medical examination without abnormal findings: Secondary | ICD-10-CM | POA: Diagnosis not present

## 2019-12-12 DIAGNOSIS — I10 Essential (primary) hypertension: Secondary | ICD-10-CM | POA: Diagnosis not present

## 2019-12-12 DIAGNOSIS — M810 Age-related osteoporosis without current pathological fracture: Secondary | ICD-10-CM | POA: Diagnosis not present

## 2020-01-09 DIAGNOSIS — Z1231 Encounter for screening mammogram for malignant neoplasm of breast: Secondary | ICD-10-CM | POA: Diagnosis not present

## 2020-02-26 ENCOUNTER — Ambulatory Visit
Admission: EM | Admit: 2020-02-26 | Discharge: 2020-02-26 | Disposition: A | Discharge status: Home / Self Care | Payer: Medicare Other | Attending: Emergency Medicine | Admitting: Emergency Medicine

## 2020-02-26 ENCOUNTER — Other Ambulatory Visit: Payer: Self-pay

## 2020-02-26 DIAGNOSIS — J069 Acute upper respiratory infection, unspecified: Secondary | ICD-10-CM

## 2020-02-26 MED ORDER — FLUTICASONE PROPIONATE 50 MCG/ACT NA SUSP: 2.0000 | Freq: Every day | NASAL | 0 refills | Status: DC

## 2020-02-26 MED ORDER — CETIRIZINE HCL 1 MG/ML PO SOLN: 5.0000 mg | Freq: Every day | ORAL | 0 refills | Status: DC

## 2020-02-26 MED ORDER — BENZONATATE 100 MG PO CAPS: 100.0000 mg | ORAL_CAPSULE | Freq: Three times a day (TID) | ORAL | 0 refills | Status: DC

## 2020-02-26 NOTE — Discharge Instructions (Addendum)
Declines covid Get plenty of rest and push fluids Tessalon Perles prescribed for cough Prescribed zyrtec for nasal congestion, runny nose, and/or sore throat Prescribed flonase for nasal congestion and runny nose Use medications daily for symptom relief Use OTC medications like ibuprofen or tylenol as needed fever or pain Follow up with PCP for recheck this week Call or go to the ED if you have any new or worsening symptoms such as fever, worsening cough, shortness of breath, chest tightness, chest pain, turning blue, changes in mental status, etc..Marland Kitchen

## 2020-02-26 NOTE — ED Provider Notes (Signed)
[place]   [id number] 02/26/20 Arrival Time: 7616   CC: COVID symptoms  SUBJECTIVE: History from: patient.  Elizabeth Norman is a 79 y.o. female who presents with fatigue, congestion, cough, and nausea x couple days.  Denies sick exposure to COVID, flu or strep.   Has tried OTC tylenol without relief.  Denies aggravating factors.  Denies previous COVID infection in the past.   Has not received her covid vaccine.  Denies fever, chills, SOB, wheezing, chest pain, nausea, changes in bowel or bladder habits.    ROS: As per HPI.  All other pertinent ROS negative.     Past Medical History:  Diagnosis Date   Hypertension    Past Surgical History:  Procedure Laterality Date   CATARACT EXTRACTION W/PHACO Right 10/03/2014   Procedure: CATARACT EXTRACTION PHACO AND INTRAOCULAR LENS PLACEMENT (IOC);  Surgeon: [name], MD;  Location: [facility];  Service: Ophthalmology;  Laterality: Right;  CDE:10.09   CATARACT EXTRACTION W/PHACO Left 10/10/2014   Procedure: CATARACT EXTRACTION PHACO AND INTRAOCULAR LENS PLACEMENT (IOC);  Surgeon: [name], MD;  Location: [facility];  Service: Ophthalmology;  Laterality: Left;  CDE:8.69   No Known Allergies No current facility-administered medications on file prior to encounter.   Current Outpatient Medications on File Prior to Encounter  Medication Sig Dispense Refill   alendronate (FOSAMAX) 70 MG tablet Take 1 tablet by mouth once a week. Takes on Sundays.  0   ALPRAZolam (XANAX) 0.5 MG tablet Take 0.5 mg by mouth 3 (three) times daily.  2   bisoprolol-hydrochlorothiazide (ZIAC) 5-6.25 MG per tablet Take 1 tablet by mouth daily.  0   calcium carbonate (OS-CAL - DOSED IN MG OF ELEMENTAL CALCIUM) 1250 (500 CA) MG tablet Take 1 tablet by mouth daily.     cholecalciferol (VITAMIN D) 1000 UNITS tablet Take 1,000 Units by mouth daily.     Cyanocobalamin (VITAMIN B-12 PO) Take 1 tablet by mouth daily.     Social History   Socioeconomic  History   Marital status: Widowed    Spouse name: Not on file   Number of children: Not on file   Years of education: Not on file   Highest education level: Not on file  Occupational History   Not on file  Tobacco Use   Smoking status: Never Smoker  Substance and Sexual Activity   Alcohol use: No   Drug use: No   Sexual activity: Not on file  Other Topics Concern   Not on file  Social History Narrative   Not on file   Social Determinants of Health   Financial Resource Strain:    Difficulty of Paying Living Expenses: Not on file  Food Insecurity:    Worried About [place] in the Last Year: Not on file   Ran Out of Food in the Last Year: Not on file  Transportation Needs:    Lack of Transportation (Medical): Not on file   Lack of Transportation (Non-Medical): Not on file  Physical Activity:    Days of Exercise per Week: Not on file   Minutes of Exercise per Session: Not on file  Stress:    Feeling of Stress : Not on file  Social Connections:    Frequency of Communication with Friends and Family: Not on file   Frequency of Social Gatherings with Friends and Family: Not on file   Attends Religious Services: Not on file   Active Member of Clubs or Organizations: Not on file  Attends Archivist Meetings: Not on file   Marital Status: Not on file  Intimate Partner Violence:    Fear of Current or Ex-Partner: Not on file   Emotionally Abused: Not on file   Physically Abused: Not on file   Sexually Abused: Not on file   No family history on file.  OBJECTIVE:  Vitals:   02/26/20 1006  BP: 105/69  Pulse: 71  Resp: 16  Temp: 97.9 F (36.6 C)  TempSrc: Tympanic  SpO2: 96%     General appearance: alert; appears mildly fatigued, but nontoxic; speaking in full sentences and tolerating own secretions HEENT: NCAT; Ears: EACs clear, TMs pearly gray; Eyes: PERRL.  EOM grossly intact. Nose: nares patent without rhinorrhea,  Throat: oropharynx clear, tonsils non erythematous or enlarged, uvula midline  Neck: supple without LAD Lungs: unlabored respirations, symmetrical air entry; cough: absent; no respiratory distress; CTAB Heart: regular rate and rhythm.   Skin: warm and dry Psychological: alert and cooperative; normal mood and affect  ASSESSMENT & PLAN:  1. Viral URI with cough     Meds ordered this encounter  Medications   cetirizine HCl (ZYRTEC) 1 MG/ML solution    Sig: Take 5 mLs (5 mg total) by mouth daily.    Dispense:  150 mL    Refill:  0    Order Specific Question:   Supervising Provider    Answer:   Raylene Everts [[id number]]   fluticasone (FLONASE) 50 MCG/ACT nasal spray    Sig: Place 2 sprays into both nostrils daily.    Dispense:  16 g    Refill:  0    Order Specific Question:   Supervising Provider    Answer:   Raylene Everts [[id number]]   benzonatate (TESSALON) 100 MG capsule    Sig: Take 1 capsule (100 mg total) by mouth every 8 (eight) hours.    Dispense:  21 capsule    Refill:  0    Order Specific Question:   Supervising Provider    Answer:   Raylene Everts [[id number]]   Declines covid Get plenty of rest and push fluids Tessalon Perles prescribed for cough Prescribed zyrtec for nasal congestion, runny nose, and/or sore throat Prescribed flonase for nasal congestion and runny nose Use medications daily for symptom relief Use OTC medications like ibuprofen or tylenol as needed fever or pain Follow up with PCP for recheck this week Call or go to the ED if you have any new or worsening symptoms such as fever, worsening cough, shortness of breath, chest tightness, chest pain, turning blue, changes in mental status, etc...   Reviewed expectations re: course of current medical issues. Questions answered. Outlined signs and symptoms indicating need for more acute intervention. Patient verbalized understanding. After Visit Summary given.         Lestine Box,  PA-C 02/26/20 1012

## 2020-02-26 NOTE — ED Triage Notes (Signed)
Pt triaged and DC by provider

## 2020-03-01 DIAGNOSIS — U071 COVID-19: Secondary | ICD-10-CM | POA: Diagnosis not present

## 2020-03-12 DIAGNOSIS — I10 Essential (primary) hypertension: Secondary | ICD-10-CM | POA: Diagnosis not present

## 2020-03-12 DIAGNOSIS — E7849 Other hyperlipidemia: Secondary | ICD-10-CM | POA: Diagnosis not present

## 2020-03-12 DIAGNOSIS — M810 Age-related osteoporosis without current pathological fracture: Secondary | ICD-10-CM | POA: Diagnosis not present

## 2020-06-04 DIAGNOSIS — Z947 Corneal transplant status: Secondary | ICD-10-CM | POA: Diagnosis not present

## 2020-06-04 DIAGNOSIS — Z87828 Personal history of other (healed) physical injury and trauma: Secondary | ICD-10-CM | POA: Diagnosis not present

## 2020-06-04 DIAGNOSIS — Z961 Presence of intraocular lens: Secondary | ICD-10-CM | POA: Diagnosis not present

## 2020-06-11 DIAGNOSIS — I10 Essential (primary) hypertension: Secondary | ICD-10-CM | POA: Diagnosis not present

## 2020-06-11 DIAGNOSIS — E7849 Other hyperlipidemia: Secondary | ICD-10-CM | POA: Diagnosis not present

## 2020-06-11 DIAGNOSIS — M810 Age-related osteoporosis without current pathological fracture: Secondary | ICD-10-CM | POA: Diagnosis not present

## 2020-09-09 HISTORY — PX: CORNEAL TRANSPLANT: SHX108

## 2020-09-11 DIAGNOSIS — M810 Age-related osteoporosis without current pathological fracture: Secondary | ICD-10-CM | POA: Diagnosis not present

## 2020-09-11 DIAGNOSIS — E7849 Other hyperlipidemia: Secondary | ICD-10-CM | POA: Diagnosis not present

## 2020-09-11 DIAGNOSIS — I10 Essential (primary) hypertension: Secondary | ICD-10-CM | POA: Diagnosis not present

## 2020-09-11 DIAGNOSIS — Z0000 Encounter for general adult medical examination without abnormal findings: Secondary | ICD-10-CM | POA: Diagnosis not present

## 2020-09-24 DIAGNOSIS — Z87828 Personal history of other (healed) physical injury and trauma: Secondary | ICD-10-CM | POA: Diagnosis not present

## 2020-09-24 DIAGNOSIS — Z947 Corneal transplant status: Secondary | ICD-10-CM | POA: Diagnosis not present

## 2020-09-24 DIAGNOSIS — Z961 Presence of intraocular lens: Secondary | ICD-10-CM | POA: Diagnosis not present

## 2020-09-24 DIAGNOSIS — Z48810 Encounter for surgical aftercare following surgery on the sense organs: Secondary | ICD-10-CM | POA: Diagnosis not present

## 2020-12-18 DIAGNOSIS — Z0000 Encounter for general adult medical examination without abnormal findings: Secondary | ICD-10-CM | POA: Diagnosis not present

## 2020-12-18 DIAGNOSIS — M810 Age-related osteoporosis without current pathological fracture: Secondary | ICD-10-CM | POA: Diagnosis not present

## 2020-12-18 DIAGNOSIS — E7849 Other hyperlipidemia: Secondary | ICD-10-CM | POA: Diagnosis not present

## 2020-12-18 DIAGNOSIS — I10 Essential (primary) hypertension: Secondary | ICD-10-CM | POA: Diagnosis not present

## 2020-12-25 DIAGNOSIS — I10 Essential (primary) hypertension: Secondary | ICD-10-CM | POA: Diagnosis not present

## 2020-12-25 DIAGNOSIS — Z0000 Encounter for general adult medical examination without abnormal findings: Secondary | ICD-10-CM | POA: Diagnosis not present

## 2020-12-25 DIAGNOSIS — D1800 Hemangioma unspecified site: Secondary | ICD-10-CM | POA: Diagnosis not present

## 2020-12-25 DIAGNOSIS — E7849 Other hyperlipidemia: Secondary | ICD-10-CM | POA: Diagnosis not present

## 2020-12-25 DIAGNOSIS — C4371 Malignant melanoma of right lower limb, including hip: Secondary | ICD-10-CM | POA: Diagnosis not present

## 2020-12-25 DIAGNOSIS — M810 Age-related osteoporosis without current pathological fracture: Secondary | ICD-10-CM | POA: Diagnosis not present

## 2021-01-18 ENCOUNTER — Other Ambulatory Visit: Payer: Self-pay | Admitting: General Surgery

## 2021-01-18 DIAGNOSIS — C4371 Malignant melanoma of right lower limb, including hip: Secondary | ICD-10-CM | POA: Diagnosis not present

## 2021-01-31 NOTE — Pre-Procedure Instructions (Signed)
Surgical Instructions    Your procedure is scheduled on Tuesday 02/12/21.   Report to [facility] Main Entrance "A" at 09:30 A.M., then check in with the Admitting office.  Call this number if you have problems the morning of surgery:  [phone]   If you have any questions prior to your surgery date call [phone]: Open Monday-Friday 8am-4pm    Remember:  Do not eat after midnight the night before your surgery  You may drink clear liquids until 08:30 A.M. the morning of your surgery.   Clear liquids allowed are: Water, Non-Citrus Juices (without pulp), Carbonated Beverages, Clear Tea, Black Coffee Only, and Gatorade  Patient Instructions  The night before surgery:  No food after midnight. ONLY clear liquids after midnight  The day of surgery (if you do NOT have diabetes):  Drink ONE (1) Pre-Surgery Clear Ensure by 08:30 A.M. the morning of surgery. Drink in one sitting. Do not sip.  This drink was given to you during your hospital  pre-op appointment visit.  Nothing else to drink after completing the  Pre-Surgery Clear Ensure.         If you have questions, please contact your surgeon's office.     Take these medicines the morning of surgery with A SIP OF WATER   ALPRAZolam (XANAX)   prednisoLONE acetate (PRED FORTE) 1 % ophthalmic suspension  As of today, STOP taking any Aspirin (unless otherwise instructed by your surgeon) Aleve, Naproxen, Ibuprofen, Motrin, Advil, Goody's, BC's, all herbal medications, fish oil, and all vitamins.                     Do NOT Smoke (Tobacco/Vaping) or drink Alcohol 24 hours prior to your procedure.  If you use a CPAP at night, you may bring all equipment for your overnight stay.   Contacts, glasses, piercing's, hearing aid's, dentures or partials may not be worn into surgery, please bring cases for these belongings.    For patients admitted to the hospital, discharge time will be determined by your treatment team.   Patients  discharged the day of surgery will not be allowed to drive home, and someone needs to stay with them for 24 hours.  ONLY 1 SUPPORT PERSON MAY BE PRESENT WHILE YOU ARE IN SURGERY. IF YOU ARE TO BE ADMITTED ONCE YOU ARE IN YOUR ROOM YOU WILL BE ALLOWED TWO (2) VISITORS.  Minor children may have two parents present. Special consideration for safety and communication needs will be reviewed on a case by case basis.   Special instructions:   [facility]- Preparing For Surgery  Before surgery, you can play an important role. Because skin is not sterile, your skin needs to be as free of germs as possible. You can reduce the number of germs on your skin by washing with CHG (chlorahexidine gluconate) Soap before surgery.  CHG is an antiseptic cleaner which kills germs and bonds with the skin to continue killing germs even after washing.    Oral Hygiene is also important to reduce your risk of infection.  Remember - BRUSH YOUR TEETH THE MORNING OF SURGERY WITH YOUR REGULAR TOOTHPASTE  Please do not use if you have an allergy to CHG or antibacterial soaps. If your skin becomes reddened/irritated stop using the CHG.  Do not shave (including legs and underarms) for at least 48 hours prior to first CHG shower. It is OK to shave your face.  Please follow these instructions carefully.   Shower the [employer]  SURGERY and the MORNING OF SURGERY  If you chose to wash your hair, wash your hair first as usual with your normal shampoo.  After you shampoo, rinse your hair and body thoroughly to remove the shampoo.  Use CHG Soap as you would any other liquid soap. You can apply CHG directly to the skin and wash gently with a scrungie or a clean washcloth.   Apply the CHG Soap to your body ONLY FROM THE NECK DOWN.  Do not use on open wounds or open sores. Avoid contact with your eyes, ears, mouth and genitals (private parts). Wash Face and genitals (private parts)  with your normal soap.   Wash thoroughly, paying  special attention to the area where your surgery will be performed.  Thoroughly rinse your body with warm water from the neck down.  DO NOT shower/wash with your normal soap after using and rinsing off the CHG Soap.  Pat yourself dry with a CLEAN TOWEL.  Wear CLEAN PAJAMAS to bed the night before surgery  Place CLEAN SHEETS on your bed the night before your surgery  DO NOT SLEEP WITH PETS.   Day of Surgery: Shower with CHG soap. Do not wear jewelry, make up, nail polish, gel polish, artificial nails, or any other type of covering on natural nails including finger and toenails. If patients have artificial nails, gel coating, etc. that need to be removed by a nail salon please have this removed prior to surgery. Surgery may need to be canceled/delayed if the surgeon/ anesthesia feels like the patient is unable to be adequately monitored. Do not wear lotions, powders, perfumes/colognes, or deodorant. Do not shave 48 hours prior to surgery.  Men may shave face and neck. Do not bring valuables to the hospital. [facility] is not responsible for any belongings or valuables. Wear Clean/Comfortable clothing the morning of surgery Remember to brush your teeth WITH YOUR REGULAR TOOTHPASTE.   Please read over the following fact sheets that you were given.

## 2021-02-01 ENCOUNTER — Other Ambulatory Visit: Payer: Self-pay

## 2021-02-01 ENCOUNTER — Encounter (HOSPITAL_COMMUNITY)
Admission: RE | Admit: 2021-02-01 | Discharge: 2021-02-01 | Disposition: A | Discharge status: Home / Self Care | Payer: Medicare Other | Source: Ambulatory Visit | Attending: General Surgery | Admitting: General Surgery

## 2021-02-01 ENCOUNTER — Encounter (HOSPITAL_COMMUNITY): Payer: Self-pay

## 2021-02-01 DIAGNOSIS — I4510 Unspecified right bundle-branch block: Secondary | ICD-10-CM | POA: Insufficient documentation

## 2021-02-01 DIAGNOSIS — E669 Obesity, unspecified: Secondary | ICD-10-CM | POA: Insufficient documentation

## 2021-02-01 DIAGNOSIS — Z79899 Other long term (current) drug therapy: Secondary | ICD-10-CM | POA: Insufficient documentation

## 2021-02-01 DIAGNOSIS — C4371 Malignant melanoma of right lower limb, including hip: Secondary | ICD-10-CM | POA: Insufficient documentation

## 2021-02-01 DIAGNOSIS — I10 Essential (primary) hypertension: Secondary | ICD-10-CM | POA: Diagnosis not present

## 2021-02-01 DIAGNOSIS — Z8616 Personal history of COVID-19: Secondary | ICD-10-CM | POA: Diagnosis not present

## 2021-02-01 DIAGNOSIS — Z6833 Body mass index (BMI) 33.0-33.9, adult: Secondary | ICD-10-CM | POA: Insufficient documentation

## 2021-02-01 DIAGNOSIS — Z01818 Encounter for other preprocedural examination: Secondary | ICD-10-CM | POA: Insufficient documentation

## 2021-02-01 HISTORY — DX: Unspecified osteoarthritis, unspecified site: M19.90

## 2021-02-01 LAB — COMPREHENSIVE METABOLIC PANEL
ALT: 10 U/L (ref 0–44)
AST: 19 U/L (ref 15–41)
Albumin: 3.9 g/dL (ref 3.5–5.0)
Alkaline Phosphatase: 55 U/L (ref 38–126)
Anion gap: 8 (ref 5–15)
BUN: 15 mg/dL (ref 8–23)
CO2: 31 mmol/L (ref 22–32)
Calcium: 9.7 mg/dL (ref 8.9–10.3)
Chloride: 97 mmol/L — ABNORMAL LOW (ref 98–111)
Creatinine, Ser: 0.97 mg/dL (ref 0.44–1.00)
GFR, Estimated: 59 mL/min — ABNORMAL LOW (ref >60)
Glucose, Bld: 103 mg/dL — ABNORMAL HIGH (ref 70–99)
Potassium: 4.2 mmol/L (ref 3.5–5.1)
Sodium: 136 mmol/L (ref 135–145)
Total Bilirubin: 1 mg/dL (ref 0.3–1.2)
Total Protein: 7.4 g/dL (ref 6.5–8.1)

## 2021-02-01 LAB — CBC WITH DIFFERENTIAL/PLATELET
Abs Immature Granulocytes: 0.02 10*3/uL (ref 0.00–0.07)
Basophils Absolute: 0.1 10*3/uL (ref 0.0–0.1)
Basophils Relative: 1 %
Eosinophils Absolute: 0.1 10*3/uL (ref 0.0–0.5)
Eosinophils Relative: 2 %
HCT: 42 % (ref 36.0–46.0)
Hemoglobin: 13.8 g/dL (ref 12.0–15.0)
Immature Granulocytes: 0 %
Lymphocytes Relative: 26 %
Lymphs Abs: 1.7 10*3/uL (ref 0.7–4.0)
MCH: 30.7 pg (ref 26.0–34.0)
MCHC: 32.9 g/dL (ref 30.0–36.0)
MCV: 93.3 fL (ref 80.0–100.0)
Monocytes Absolute: 0.4 10*3/uL (ref 0.1–1.0)
Monocytes Relative: 7 %
Neutro Abs: 4 10*3/uL (ref 1.7–7.7)
Neutrophils Relative %: 64 %
Platelets: 196 10*3/uL (ref 150–400)
RBC: 4.5 MIL/uL (ref 3.87–5.11)
RDW: 12.3 % (ref 11.5–15.5)
WBC: 6.3 10*3/uL (ref 4.0–10.5)
nRBC: 0 % (ref 0.0–0.2)

## 2021-02-01 NOTE — Progress Notes (Addendum)
PCP - Velvet Bathe A. [name], MD; records requested Cardiologist - Denies  PPM/ICD - Denies   Chest x-ray - N/A EKG - 02/01/21 Stress Test -  Denies ECHO -  Denies Cardiac Cath -  Denies  Sleep Study -  Denies  DM- Denies  Blood Thinner Instructions: N/A Aspirin Instructions: N/A  ERAS Protcol - Yes PRE-SURGERY Ensure or G2- Ensure given  COVID TEST- N/A; Ambulatory   Anesthesia review: Yes, abnormal EKG  Patient denies shortness of breath, fever, cough and chest pain at PAT appointment   All instructions explained to the patient, with a verbal understanding of the material. Patient agrees to go over the instructions while at home for a better understanding. The opportunity to ask questions was provided.

## 2021-02-04 NOTE — Progress Notes (Signed)
Anesthesia Chart Review:  Case: [id number] Date/Time: 02/12/21 1115   Procedure: WIDE LOCAL EXCISION RIGHT LOWER LEG MELANOMA , ADVANCEMENT FLAP CLOSURE DEFECT, SENTINEL LYMPH NODE BIOPSY (Right)   Anesthesia type: General   Pre-op diagnosis: RIGHT LOWER LEG MELANOMA   Location: [facility] OR ROOM 02 / [facility]   Surgeons: [name], MD       DISCUSSION: Patient is an 80 year old female scheduled for the above procedure. She was referred to general surgery after PCP Dr. Sherrie Sport did a biopsy of a LLE lesion on 12/25/20 which was + malignant melanoma. She was seen by Dr. Barry Dienes on 01/18/21.   History includes never smoker, HTN, corneal transplant (OD 09/20/20; OS 04/05/19), COVID-19 (by notes, 02/2020). BMI is consistent with obesity.   She has a chronic incomplete RBBB on EKG (at least since 2016). She denied SOB, cough, fever, chest pain at PAT RN visit.   Anesthesia team to evaluate on the day of surgery.    VS: BP (!) 141/68   Pulse 63   Temp 36.9 C (Oral)   Resp 19   Ht 5\' 2"  (1.575 m)   Wt 82.2 kg   SpO2 97%   BMI 33.14 kg/m   PROVIDERS: Neale Burly, MD is PCP ([facility]). 12/25/20 note reviewed.    LABS: Labs reviewed: Acceptable for surgery. (all labs ordered are listed, but only abnormal results are displayed)  Labs Reviewed  COMPREHENSIVE METABOLIC PANEL - Abnormal; Notable for the following components:      Result Value   Chloride 97 (*)    Glucose, Bld 103 (*)    GFR, Estimated 59 (*)    All other components within normal limits  CBC WITH DIFFERENTIAL/PLATELET    EKG: 02/01/21:  Normal sinus rhythm with sinus arrhythmia Incomplete right bundle branch block Borderline ECG Confirmed by Carlyle Dolly [phone]) on 02/03/2021 7:59:25 PM - She had incomplete RBBB dating back to at least 09/29/14 tracing.   CV: N/A  Past Medical History:  Diagnosis Date   Arthritis    Hypertension     Past Surgical History:  Procedure Laterality Date   CATARACT  EXTRACTION W/PHACO Right 10/03/2014   Procedure: CATARACT EXTRACTION PHACO AND INTRAOCULAR LENS PLACEMENT (IOC);  Surgeon: [name], MD;  Location: [facility] ORS;  Service: Ophthalmology;  Laterality: Right;  CDE:10.09   CATARACT EXTRACTION W/PHACO Left 10/10/2014   Procedure: CATARACT EXTRACTION PHACO AND INTRAOCULAR LENS PLACEMENT (IOC);  Surgeon: [name], MD;  Location: [facility] ORS;  Service: Ophthalmology;  Laterality: Left;  CDE:8.69   CORNEAL 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   CORNEAL TRANSPLANT Left 2020   DENTAL RESTORATION/EXTRACTION WITH X-RAY      MEDICATIONS:  alendronate (FOSAMAX) 70 MG tablet   ALPRAZolam (XANAX) 0.5 MG tablet   atenolol-chlorthalidone (TENORETIC) 50-25 MG tablet   benzonatate (TESSALON) 100 MG capsule   calcium carbonate (OS-CAL - DOSED IN MG OF ELEMENTAL CALCIUM) 1250 (500 CA) MG tablet   cetirizine HCl (ZYRTEC) 1 MG/ML solution   cholecalciferol (VITAMIN D) 1000 UNITS tablet   Cyanocobalamin (VITAMIN B-12 PO)   diphenhydramine-acetaminophen (TYLENOL PM) 25-500 MG TABS tablet   fluticasone (FLONASE) 50 MCG/ACT nasal spray   prednisoLONE acetate (PRED FORTE) 1 % ophthalmic suspension   No current facility-administered medications for this encounter.    [name], PA-C Surgical Short Stay/Anesthesiology [facility] Phone [phone] [facility] Phone [phone] 02/04/2021 2:38 PM

## 2021-02-04 NOTE — Anesthesia Preprocedure Evaluation (Addendum)
Anesthesia Evaluation  Patient identified by MRN, date of birth, ID band Patient awake    Reviewed: Allergy & Precautions, NPO status , Patient's Chart, lab work & pertinent test results  Airway Mallampati: II  TM Distance: >3 FB Neck ROM: Full    Dental  (+) Poor Dentition   Pulmonary neg pulmonary ROS,    Pulmonary exam normal breath sounds clear to auscultation       Cardiovascular hypertension, negative cardio ROS Normal cardiovascular exam Rhythm:Regular Rate:Normal     Neuro/Psych negative neurological ROS  negative psych ROS   GI/Hepatic negative GI ROS, Neg liver ROS,   Endo/Other  negative endocrine ROS  Renal/GU negative Renal ROS  negative genitourinary   Musculoskeletal  (+) Arthritis , Osteoarthritis,    Abdominal   Peds negative pediatric ROS (+)  Hematology negative hematology ROS (+)   Anesthesia Other Findings   Reproductive/Obstetrics negative OB ROS                            Anesthesia Physical Anesthesia Plan  ASA: 3  Anesthesia Plan: General   Post-op Pain Management:    Induction: Intravenous  PONV Risk Score and Plan: Ondansetron, Dexamethasone, Midazolam and Treatment may vary due to age or medical condition  Airway Management Planned: LMA  Additional Equipment:   Intra-op Plan:   Post-operative Plan: Extubation in OR  Informed Consent: I have reviewed the patients History and Physical, chart, labs and discussed the procedure including the risks, benefits and alternatives for the proposed anesthesia with the patient or authorized representative who has indicated his/her understanding and acceptance.     Dental advisory given  Plan Discussed with: CRNA, Anesthesiologist and Surgeon  Anesthesia Plan Comments: (PAT note written 02/04/2021 by [name], PA-C. )       Anesthesia Quick Evaluation

## 2021-02-12 ENCOUNTER — Ambulatory Visit: Admit: 2021-02-12 | Payer: Medicare Other | Admitting: General Surgery

## 2021-02-12 ENCOUNTER — Ambulatory Visit (HOSPITAL_COMMUNITY)
Admission: RE | Admit: 2021-02-12 | Discharge: 2021-02-12 | Disposition: A | Discharge status: Home / Self Care | Payer: Medicare Other | Source: Ambulatory Visit | Attending: General Surgery | Admitting: General Surgery

## 2021-02-12 ENCOUNTER — Ambulatory Visit (HOSPITAL_COMMUNITY)
Admission: RE | Admit: 2021-02-12 | Discharge: 2021-02-12 | Disposition: A | Discharge status: Home / Self Care | Payer: Medicare Other | Attending: General Surgery | Admitting: General Surgery

## 2021-02-12 ENCOUNTER — Ambulatory Visit (HOSPITAL_COMMUNITY): Payer: Medicare Other | Admitting: Certified Registered Nurse Anesthetist

## 2021-02-12 ENCOUNTER — Ambulatory Visit (HOSPITAL_COMMUNITY): Payer: Medicare Other | Admitting: Vascular Surgery

## 2021-02-12 ENCOUNTER — Other Ambulatory Visit: Payer: Self-pay

## 2021-02-12 ENCOUNTER — Encounter (HOSPITAL_COMMUNITY)
Admission: RE | Disposition: A | Discharge status: Home / Self Care | Payer: Self-pay | Source: Home / Self Care | Attending: General Surgery

## 2021-02-12 ENCOUNTER — Encounter (HOSPITAL_COMMUNITY): Payer: Self-pay | Admitting: General Surgery

## 2021-02-12 DIAGNOSIS — Z800 Family history of malignant neoplasm of digestive organs: Secondary | ICD-10-CM | POA: Insufficient documentation

## 2021-02-12 DIAGNOSIS — C4371 Malignant melanoma of right lower limb, including hip: Secondary | ICD-10-CM

## 2021-02-12 DIAGNOSIS — I10 Essential (primary) hypertension: Secondary | ICD-10-CM | POA: Diagnosis not present

## 2021-02-12 DIAGNOSIS — C774 Secondary and unspecified malignant neoplasm of inguinal and lower limb lymph nodes: Secondary | ICD-10-CM | POA: Diagnosis not present

## 2021-02-12 DIAGNOSIS — Z79899 Other long term (current) drug therapy: Secondary | ICD-10-CM | POA: Insufficient documentation

## 2021-02-12 DIAGNOSIS — Z7983 Long term (current) use of bisphosphonates: Secondary | ICD-10-CM | POA: Diagnosis not present

## 2021-02-12 DIAGNOSIS — C439 Malignant melanoma of skin, unspecified: Secondary | ICD-10-CM | POA: Diagnosis not present

## 2021-02-12 DIAGNOSIS — M1990 Unspecified osteoarthritis, unspecified site: Secondary | ICD-10-CM | POA: Diagnosis not present

## 2021-02-12 DIAGNOSIS — Z803 Family history of malignant neoplasm of breast: Secondary | ICD-10-CM | POA: Insufficient documentation

## 2021-02-12 HISTORY — PX: MELANOMA EXCISION: SHX5266

## 2021-02-12 HISTORY — PX: SENTINEL NODE BIOPSY: SHX6608

## 2021-02-12 SURGERY — EXCISION, MELANOMA
Anesthesia: General | Site: Leg Upper | Laterality: Right

## 2021-02-12 SURGERY — EXCISION, MELANOMA
Anesthesia: General | Laterality: Right

## 2021-02-12 MED ORDER — PHENYLEPHRINE HCL (PRESSORS) 10 MG/ML IV SOLN
INTRAVENOUS | Status: DC | PRN
  Administered 2021-02-12: 80 ug via INTRAVENOUS
  Administered 2021-02-12: 40 ug via INTRAVENOUS

## 2021-02-12 MED ORDER — PHENYLEPHRINE 40 MCG/ML (10ML) SYRINGE FOR IV PUSH (FOR BLOOD PRESSURE SUPPORT)
PREFILLED_SYRINGE | INTRAVENOUS | Status: AC
  Filled 2021-02-12: qty 10

## 2021-02-12 MED ORDER — ACETAMINOPHEN 500 MG PO TABS
ORAL_TABLET | ORAL | Status: AC
  Administered 2021-02-12: 1000 mg via ORAL
  Filled 2021-02-12: qty 2

## 2021-02-12 MED ORDER — OXYCODONE HCL 5 MG PO TABS: 5.0000 mg | ORAL_TABLET | Freq: Once | ORAL | Status: DC | PRN

## 2021-02-12 MED ORDER — LIDOCAINE 2% (20 MG/ML) 5 ML SYRINGE
INTRAMUSCULAR | Status: AC
  Filled 2021-02-12: qty 5

## 2021-02-12 MED ORDER — DEXAMETHASONE SODIUM PHOSPHATE 10 MG/ML IJ SOLN
INTRAMUSCULAR | Status: AC
  Filled 2021-02-12: qty 1

## 2021-02-12 MED ORDER — BUPIVACAINE-EPINEPHRINE (PF) 0.25% -1:200000 IJ SOLN
INTRAMUSCULAR | Status: AC
  Filled 2021-02-12: qty 30

## 2021-02-12 MED ORDER — CHLORHEXIDINE GLUCONATE 0.12 % MT SOLN
15.0000 mL | Freq: Once | OROMUCOSAL | Status: AC
  Administered 2021-02-12: 15 mL via OROMUCOSAL
  Filled 2021-02-12: qty 15

## 2021-02-12 MED ORDER — LIDOCAINE HCL 1 % IJ SOLN
INTRAMUSCULAR | Status: DC | PRN
  Administered 2021-02-12: 10 mL
  Administered 2021-02-12: 30 mL

## 2021-02-12 MED ORDER — ONDANSETRON HCL 4 MG/2ML IJ SOLN
INTRAMUSCULAR | Status: AC
  Filled 2021-02-12: qty 2

## 2021-02-12 MED ORDER — CEFAZOLIN SODIUM-DEXTROSE 2-4 GM/100ML-% IV SOLN
INTRAVENOUS | Status: AC
  Filled 2021-02-12: qty 100

## 2021-02-12 MED ORDER — ONDANSETRON HCL 4 MG/2ML IJ SOLN: 4.0000 mg | Freq: Once | INTRAMUSCULAR | Status: DC | PRN

## 2021-02-12 MED ORDER — METHYLENE BLUE 0.5 % INJ SOLN
INTRAVENOUS | Status: AC
  Filled 2021-02-12: qty 10

## 2021-02-12 MED ORDER — LIDOCAINE HCL (PF) 1 % IJ SOLN
INTRAMUSCULAR | Status: AC
  Filled 2021-02-12: qty 30

## 2021-02-12 MED ORDER — ORAL CARE MOUTH RINSE: 15.0000 mL | Freq: Once | OROMUCOSAL | Status: AC

## 2021-02-12 MED ORDER — LIDOCAINE 2% (20 MG/ML) 5 ML SYRINGE
INTRAMUSCULAR | Status: DC | PRN
Start: 2021-02-12 — End: 2021-02-12
  Administered 2021-02-12: 60 mg via INTRAVENOUS

## 2021-02-12 MED ORDER — FENTANYL CITRATE (PF) 100 MCG/2ML IJ SOLN: 25.0000 ug | INTRAMUSCULAR | Status: DC | PRN

## 2021-02-12 MED ORDER — METHYLENE BLUE 0.5 % INJ SOLN
INTRAVENOUS | Status: DC | PRN
  Administered 2021-02-12: 1 mL via SUBMUCOSAL

## 2021-02-12 MED ORDER — ONDANSETRON HCL 4 MG/2ML IJ SOLN
INTRAMUSCULAR | Status: DC | PRN
  Administered 2021-02-12: 4 mg via INTRAVENOUS

## 2021-02-12 MED ORDER — FENTANYL CITRATE (PF) 250 MCG/5ML IJ SOLN
INTRAMUSCULAR | Status: AC
  Filled 2021-02-12: qty 5

## 2021-02-12 MED ORDER — TECHNETIUM TC 99M TILMANOCEPT KIT
0.5000 | PACK | Freq: Once | INTRAVENOUS | Status: AC | PRN
  Administered 2021-02-12: 0.5 via INTRADERMAL

## 2021-02-12 MED ORDER — ACETAMINOPHEN 500 MG PO TABS: 1000.0000 mg | ORAL_TABLET | ORAL | Status: AC

## 2021-02-12 MED ORDER — CHLORHEXIDINE GLUCONATE CLOTH 2 % EX PADS: 6.0000 | MEDICATED_PAD | Freq: Once | CUTANEOUS | Status: DC

## 2021-02-12 MED ORDER — LACTATED RINGERS IV SOLN: INTRAVENOUS | Status: DC

## 2021-02-12 MED ORDER — OXYCODONE HCL 5 MG/5ML PO SOLN
5.0000 mg | Freq: Once | ORAL | Status: DC | PRN
Start: 2021-02-12 — End: 2021-02-12

## 2021-02-12 MED ORDER — ENSURE PRE-SURGERY PO LIQD: 296.0000 mL | Freq: Once | ORAL | Status: DC

## 2021-02-12 MED ORDER — EPHEDRINE 5 MG/ML INJ
INTRAVENOUS | Status: AC
  Filled 2021-02-12: qty 5

## 2021-02-12 MED ORDER — OXYCODONE HCL 5 MG PO TABS: 2.5000 mg | ORAL_TABLET | Freq: Four times a day (QID) | ORAL | 0 refills | Status: DC | PRN

## 2021-02-12 MED ORDER — EPHEDRINE SULFATE 50 MG/ML IJ SOLN
INTRAMUSCULAR | Status: DC | PRN
  Administered 2021-02-12: 10 mg via INTRAVENOUS

## 2021-02-12 MED ORDER — PROPOFOL 10 MG/ML IV BOLUS
INTRAVENOUS | Status: AC
  Filled 2021-02-12: qty 20

## 2021-02-12 MED ORDER — PROPOFOL 10 MG/ML IV BOLUS
INTRAVENOUS | Status: DC | PRN
  Administered 2021-02-12: 120 mg via INTRAVENOUS

## 2021-02-12 MED ORDER — DEXAMETHASONE SODIUM PHOSPHATE 10 MG/ML IJ SOLN
INTRAMUSCULAR | Status: DC | PRN
  Administered 2021-02-12: 10 mg via INTRAVENOUS

## 2021-02-12 MED ORDER — CEFAZOLIN SODIUM-DEXTROSE 2-4 GM/100ML-% IV SOLN
2.0000 g | INTRAVENOUS | Status: AC
  Administered 2021-02-12: 2 g via INTRAVENOUS

## 2021-02-12 MED ORDER — AMISULPRIDE (ANTIEMETIC) 5 MG/2ML IV SOLN: 10.0000 mg | Freq: Once | INTRAVENOUS | Status: DC | PRN

## 2021-02-12 MED ORDER — FENTANYL CITRATE (PF) 100 MCG/2ML IJ SOLN
INTRAMUSCULAR | Status: DC | PRN
  Administered 2021-02-12: 50 ug via INTRAVENOUS
  Administered 2021-02-12: 25 ug via INTRAVENOUS

## 2021-02-12 SURGICAL SUPPLY — 47 items
ADH SKN CLS APL DERMABOND .7 (GAUZE/BANDAGES/DRESSINGS) ×2
APL PRP STRL LF DISP 70% ISPRP (MISCELLANEOUS) ×2
APL SKNCLS STERI-STRIP NONHPOA (GAUZE/BANDAGES/DRESSINGS) ×2
BAG COUNTER SPONGE SURGICOUNT (BAG) ×3 IMPLANT
BAG SPNG CNTER NS LX DISP (BAG) ×2
BENZOIN TINCTURE PRP APPL 2/3 (GAUZE/BANDAGES/DRESSINGS) ×3 IMPLANT
BNDG ELASTIC 4X5.8 VLCR STR LF (GAUZE/BANDAGES/DRESSINGS) ×1 IMPLANT
BNDG GAUZE ELAST 4 BULKY (GAUZE/BANDAGES/DRESSINGS) ×1 IMPLANT
CANISTER SUCT 3000ML PPV (MISCELLANEOUS) ×3 IMPLANT
CHLORAPREP W/TINT 26 (MISCELLANEOUS) ×3 IMPLANT
CLIP TI MEDIUM 24 (CLIP) ×1 IMPLANT
COVER SURGICAL LIGHT HANDLE (MISCELLANEOUS) ×3 IMPLANT
DERMABOND ADVANCED (GAUZE/BANDAGES/DRESSINGS) ×1
DERMABOND ADVANCED .7 DNX12 (GAUZE/BANDAGES/DRESSINGS) IMPLANT
DRAPE LAPAROTOMY 100X72 PEDS (DRAPES) IMPLANT
DRSG TEGADERM 4X4.75 (GAUZE/BANDAGES/DRESSINGS) IMPLANT
ELECT REM PT RETURN 9FT ADLT (ELECTROSURGICAL) ×3
ELECTRODE REM PT RTRN 9FT ADLT (ELECTROSURGICAL) ×2 IMPLANT
GAUZE 4X4 16PLY ~~LOC~~+RFID DBL (SPONGE) ×3 IMPLANT
GAUZE SPONGE 4X4 12PLY STRL (GAUZE/BANDAGES/DRESSINGS) ×1 IMPLANT
GAUZE XEROFORM 1X8 LF (GAUZE/BANDAGES/DRESSINGS) ×1 IMPLANT
GLOVE SURG ENC MOIS LTX SZ6 (GLOVE) ×3 IMPLANT
GLOVE SURG UNDER LTX SZ6.5 (GLOVE) ×3 IMPLANT
GOWN STRL REUS W/ TWL LRG LVL3 (GOWN DISPOSABLE) ×2 IMPLANT
GOWN STRL REUS W/TWL 2XL LVL3 (GOWN DISPOSABLE) ×3 IMPLANT
GOWN STRL REUS W/TWL LRG LVL3 (GOWN DISPOSABLE) ×3
KIT BASIN OR (CUSTOM PROCEDURE TRAY) ×3 IMPLANT
KIT TURNOVER KIT B (KITS) ×3 IMPLANT
MARKER SKIN DUAL TIP RULER LAB (MISCELLANEOUS) ×3 IMPLANT
NDL HYPO 25GX1X1/2 BEV (NEEDLE) ×4 IMPLANT
NEEDLE HYPO 25GX1X1/2 BEV (NEEDLE) ×6 IMPLANT
NS IRRIG 1000ML POUR BTL (IV SOLUTION) ×3 IMPLANT
PACK GENERAL/GYN (CUSTOM PROCEDURE TRAY) ×3 IMPLANT
PAD ARMBOARD 7.5X6 YLW CONV (MISCELLANEOUS) ×6 IMPLANT
PENCIL SMOKE EVACUATOR (MISCELLANEOUS) ×3 IMPLANT
SPONGE T-LAP 18X18 ~~LOC~~+RFID (SPONGE) ×1 IMPLANT
STRIP CLOSURE SKIN 1/2X4 (GAUZE/BANDAGES/DRESSINGS) IMPLANT
SUT ETHILON 2 0 FS 18 (SUTURE) ×3 IMPLANT
SUT ETHILON 2 0 PSLX (SUTURE) ×2 IMPLANT
SUT MNCRL AB 4-0 PS2 18 (SUTURE) ×3 IMPLANT
SUT SILK 2 0 PERMA HAND 18 BK (SUTURE) IMPLANT
SUT VIC AB 2-0 SH 27 (SUTURE) ×9
SUT VIC AB 2-0 SH 27XBRD (SUTURE) ×2 IMPLANT
SUT VIC AB 3-0 SH 27 (SUTURE) ×3
SUT VIC AB 3-0 SH 27X BRD (SUTURE) ×2 IMPLANT
SYR CONTROL 10ML LL (SYRINGE) ×6 IMPLANT
TOWEL GREEN STERILE FF (TOWEL DISPOSABLE) ×3 IMPLANT

## 2021-02-12 NOTE — Interval H&P Note (Signed)
History and Physical Interval Note:  02/12/2021 11:21 AM  Elizabeth Norman  has presented today for surgery, with the diagnosis of RIGHT LOWER LEG MELANOMA.  The various methods of treatment have been discussed with the patient and family. After consideration of risks, benefits and other options for treatment, the patient has consented to  Procedure(s): WIDE LOCAL EXCISION RIGHT LOWER LEG MELANOMA , ADVANCEMENT FLAP CLOSURE DEFECT, SENTINEL LYMPH NODE BIOPSY (Right) as a surgical intervention.  The patient's history has been reviewed, patient examined, no change in status, stable for surgery.  I have reviewed the patient's chart and labs.  Questions were answered to the patient's satisfaction.     [name]

## 2021-02-12 NOTE — Transfer of Care (Signed)
Immediate Anesthesia Transfer of Care Note  Patient: Elizabeth Norman  Procedure(s) Performed: WIDE LOCAL EXCISION RIGHT LOWER LEG MELANOMA , ADVANCEMENT FLAP CLOSURE DEFECT (Right: Leg Lower) SENTINEL NODE BIOPSY (Right: Leg Upper)  Patient Location: PACU  Anesthesia Type:General  Level of Consciousness: awake, alert  and patient cooperative  Airway & Oxygen Therapy: Patient Spontanous Breathing and Patient connected to face mask  Post-op Assessment: Report given to RN and Post -op Vital signs reviewed and stable  Post vital signs: Reviewed and stable  Last Vitals:  Vitals Value Taken Time  BP 123/59 02/12/21 1324  Temp    Pulse 78 02/12/21 1326  Resp 14 02/12/21 1326  SpO2 98 % 02/12/21 1326  Vitals shown include unvalidated device data.  Last Pain:  Vitals:   02/12/21 0956  TempSrc:   PainSc: 0-No pain         Complications: No notable events documented.

## 2021-02-12 NOTE — Anesthesia Procedure Notes (Cosign Needed Addendum)
Procedure Name: LMA Insertion Date/Time: 02/12/2021 11:35 AM Performed by: Charyl Dancer, RN Pre-anesthesia Checklist: Patient identified, Emergency Drugs available, Suction available and Patient being monitored Patient Re-evaluated:Patient Re-evaluated prior to induction Oxygen Delivery Method: Circle system utilized Preoxygenation: Pre-oxygenation with 100% oxygen Induction Type: IV induction LMA: LMA inserted LMA Size: 4.0 Tube type: Oral Number of attempts: 1 Placement Confirmation: positive ETCO2 and breath sounds checked- equal and bilateral Tube secured with: Tape Dental Injury: Teeth and Oropharynx as per pre-operative assessment

## 2021-02-12 NOTE — Op Note (Addendum)
PRE-OPERATIVE DIAGNOSIS: cT4a melanoma  POST-OPERATIVE DIAGNOSIS:  Same  PROCEDURE:  Procedure(s): Wide local excision 2 cm margins, advancement flap closure for defect 10 cm x 4 cm, and sentinel lymph node mapping and biopsy  SURGEON:  Surgeon(s): [name], MD  ASSIST:  Vernell Leep, MD  ANESTHESIA: local and general  DRAINS: none   LOCAL MEDICATIONS USED:  MARCAINE and XYLOCAINE   SPECIMEN: 3 right inguinal sentinel lymph nodes, wide local excision right lower extremity melanoma   FINDINGS: Previously excision biopsy right lower extremity melanoma, well healed. Large 3-4cm dominant right inguinal node and 2 smaller 2cm right inguinal sentinel lymph nodes  DISPOSITION OF SPECIMEN:  PATHOLOGY  COUNTS:  YES  PLAN OF CARE: Discharge to home after PACU  PATIENT DISPOSITION:  PACU - hemodynamically stable.   PROCEDURE:   Pt was identified in the holding area, taken to the OR, and placed supine on the OR table.  General anesthesia was induced.  Time out was performed according to the surgical safety checklist.  When all was correct, we continued.  Two mL methylene blue was injected intradermally around the melanoma biopsy site.    The patient was placed into the supine position with the right lower extremity frog legged out laterally.  The right lower extremity was prepped and draped in sterile fashion.  The melanoma was identified and 2 cm margins were marked out with the superior and inferior margins extended to created an approximately 3:1 elliptical configuration.  Local was administered under the melanoma and the adjacent tissue.  A #10 blade was used to incise the skin around the melanoma.  The cautery was used to take the dissection down to the fascia.  The skin was marked in situ with orientation sutures.  The cautery was used to take the specimen off the fascia, and it was passed off the table.    Skin hooks were used to elevate the edges of the incision and the skin was  freed up in all directions.  This was pulled together in an longitudinal orientation. The skin was pulled together to check the tension. . Deep interrupted 2-0 vicryl sutures were placed to relieve tension, although due to poor fascial integrity, only 3 were placed near the corners of the incision. Therefore, 2-0 nylon vertical mattress sutures were placed to approximate the incision. The skin was then closed with a 4-0 monocryl running subcuticular sutures.    Our attention was then turned to the patient's right inguinal canal. The point of maximum signal intensity was identified with the neoprobe.  A 4 cm incision was made with a #15 blade.  The subcutaneous tissues were divided with the cautery.  A Weitlaner retractor was used to assist with visualization. 3 sentinel lymph nodes were identified as described above.  The lymphovascular channels were clipped with hemoclips.  The nodes were passed off as specimens.  Hemostasis was achieved with the cautery.  The groin was irrigated and closed with 3-0 Vicryl deep dermal interrupted sutures and 4-0 Monocryl running subcuticular suture. The skin closure was dressed with dermabond.    The melanoma site was cleaned, dried, and dressed with xeroform, gauze, and Kerlex, and Ace wrap.    Needle, sponge, and instrument counts were correct.  The patient was awakened from anesthesia and taken to the PACU in stable condition.      I was scrubbed for the critical and key portions of the surgery and I was immediately available throughout the entire procedure.  I have reviewed  and agree with the operative note as documented by the resident.

## 2021-02-12 NOTE — Discharge Instructions (Addendum)
[facility] Office Phone Number [phone]   POST OP INSTRUCTIONS  Always review your discharge instruction sheet given to you by the facility where your surgery was performed.  IF YOU HAVE DISABILITY OR FAMILY LEAVE FORMS, YOU MUST BRING THEM TO THE OFFICE FOR PROCESSING.  DO NOT GIVE THEM TO YOUR DOCTOR.  A prescription for pain medication may be given to you upon discharge.  Take your pain medication as prescribed, if needed.  If narcotic pain medicine is not needed, then you may take acetaminophen (Tylenol) or ibuprofen (Advil) as needed. Take your usually prescribed medications unless otherwise directed If you need a refill on your pain medication, please contact your pharmacy.  They will contact our office to request authorization.  Prescriptions will not be filled after 5pm or on week-ends. You should eat very light the first 24 hours after surgery, such as soup, crackers, pudding, etc.  Resume your normal diet the day after surgery It is common to experience some constipation if taking pain medication after surgery.  Increasing fluid intake and taking a stool softener will usually help or prevent this problem from occurring.  A mild laxative (Milk of Magnesia or Miralax) should be taken according to package directions if there are no bowel movements after 48 hours. KEEP LEG ELEVATED WHILE NOT MOVING AROUND. This will help significantly with swelling.   You may remove leg dressing and shower in 48 hours.  Recover the wound with non stick gauze and wrap the lower leg as needed.  The surgical glue will flake off in 2-3 weeks.   ACTIVITIES:  No strenuous activity or heavy lifting for 2 weeks.   You may drive when you no longer are taking prescription pain medication, you can comfortably wear a seatbelt, and you can safely maneuver your car and apply brakes. RETURN TO WORK:  __________n/a_______________ Dennis Bast should see your doctor in the office for a follow-up appointment  approximately three-four weeks after your surgery.    WHEN TO CALL YOUR DOCTOR: Fever over 101.0 Nausea and/or vomiting. Extreme swelling or bruising. Continued bleeding from incision. Increased pain, redness, or drainage from the incision.  The clinic staff is available to answer your questions during regular business hours.  Please don't hesitate to call and ask to speak to one of the nurses for clinical concerns.  If you have a medical emergency, go to the nearest emergency room or call 911.  A surgeon from [facility] is always on call at the hospital.  For further questions, please visit [email or web address]

## 2021-02-12 NOTE — H&P (Signed)
REFERRING PHYSICIAN: [name], Dr. Velvet Bathe  PROVIDER: Georgianne Fick, MD  MRN: [id number] DOB: 12/10/1940 DATE OF ENCOUNTER: 01/18/2021  Subjective   Chief Complaint: Melanoma   History of Present Illness: Elizabeth Norman is a 80 y.o. female who is seen today as an office consultation at the request of Dr. Sherrie Sport for evaluation of Melanoma .   Pt presented to her PCP August 2022 with a lower leg lesion on the right. It had been irritated and started bleeding. She had tried over the counter creams to no avail. Dr. Sherrie Sport was concerned and did a biopsy of the lesion which showed malignant melanoma, nodular subtype, Breslow thickness 9.52 mm. No satellitosis, no ulceration, no LVI. Deep margin was positive. There were 2 mitoses per mm2. Path was a pT4a. Just in the last week she has developed an area of swelling in her right groin.   She has no family history of melanoma, but has a sister who had breast cancer and a father with colon cancer. She is very healthy overall. She gets around well and is able to walk to the mailbox and feed the chickens.   Pathology from [place] specimen ID [id number]  Review of Systems: A complete review of systems was obtained from the patient. I have reviewed this information and discussed as appropriate with the patient. See HPI as well for other ROS.  Review of Systems  Psychiatric/Behavioral: Positive for depression. The patient is nervous/anxious.    Medical History: Past Medical History:  Diagnosis Date   Hypertension   Patient Active Problem List  Diagnosis   Malignant melanoma of right posterior calf (CMS-HCC)   History reviewed. No pertinent surgical history.   No Known Allergies  Current Outpatient Medications on File Prior to Visit  Medication Sig Dispense Refill   alendronate (FOSAMAX) 70 MG tablet TAKE ONE TABLET BY MOUTH ONCE A WEEK IN THE MORNING WITH A FULL GLASS OF WATER ON AN EMPTY STOMACH. DO NOT LAY DOWN FOR 30  MINUTES.   ALPRAZolam (XANAX) 0.5 MG tablet Take 0.5 mg by mouth 3 (three) times daily   atenoloL-chlorthalidone (TENORETIC) 50-25 mg tablet Take 1 tablet by mouth once daily   calcium carbonate 500 mg calcium (1,250 mg) tablet Take 1 tablet by mouth once daily   cholecalciferol (VITAMIN D3) 1000 unit tablet Take by mouth   cyanocobalamin (VITAMIN B12) 100 MCG tablet Take by mouth at bedtime as needed   No current facility-administered medications on file prior to visit.   Family History  Problem Relation Age of Onset   Colon cancer Father   Breast cancer Sister   Diabetes Son    Social History   Tobacco Use  Smoking Status Never Smoker  Smokeless Tobacco Never Used    Social History   Socioeconomic History   Marital status: Widowed  Tobacco Use   Smoking status: Never Smoker   Smokeless tobacco: Never Used  Substance and Sexual Activity   Alcohol use: Never   Drug use: Never   Objective:   Vitals:  01/18/21 0948  BP: (!) 142/80  Pulse: 73  Temp: 36.4 C (97.5 F)  SpO2: 96%  Weight: 82.5 kg (181 lb 12.8 oz)  Height: 157.5 cm (5\' 2" )   Body mass index is 33.25 kg/m.  Physical Exam  Head: Normocephalic and atraumatic.  Mouth/Throat: Oropharynx is clear and moist. No oropharyngeal exudate.  Eyes: Conjunctivae are normal. Pupils are equal, round, and reactive to light. No scleral icterus.  Neck: Normal range  of motion. Neck supple. No tracheal deviation present. No thyromegaly present.  Cardiovascular: Normal rate, regular rhythm, normal heart sounds and intact distal pulses. Exam reveals no gallop and no friction rub.  No murmur heard. Respiratory: Effort normal and breath sounds normal. No respiratory distress. No wheezes, rales or rhonchi. No chest wall tenderness.  GI: Soft. Bowel sounds are normal. Abdomen is soft, non tender, non distended. No masses or hepatosplenomegaly is present. There is no rebound and no guarding.  Musculoskeletal: . Extremities are  non tender and without deformity.  Lymphadenopathy: Palpable node in the right groin. No cervical or axillary adenopathy.  Neurological: Alert and oriented to person, place, and time. Coordination normal.  Skin: Right inferomedial calf with incision from melanoma resection. Skin is warm and dry. No rash noted. No diaphoresis. No erythema. No pallor.  Psychiatric: Normal mood and affect.Behavior is normal. Judgment and thought content normal.   Labs, Imaging and Diagnostic Testing: See above.   Assessment and Plan:  Diagnoses and all orders for this visit:  Malignant melanoma of right posterior calf (CMS-HCC) Assessment & Plan: Patient will need a wide local excision with advancement flap closure and a sentinel node biopsy.   I reviewed with the patient and her daughter the surgery. I discussed that the melanoma incision would be quite large and there would be a defect in the calf that would soften over time. I discussed that this would be numb. I reviewed the risk of any sentinel node biopsy including seroma and lymphocele. I discussed the risk of wound breakdown in the melanoma site. I discussed risk of lymphedema. I also reviewed risks of all surgeries which include bleeding, infection, damage to adjacent structures, heart or lung complications, blood clot, and death.  We will do this at the first available opportunity.  She will need to see a dermatologist for a complete skin check.     Georgianne Fick, MD

## 2021-02-13 ENCOUNTER — Encounter (HOSPITAL_COMMUNITY): Payer: Self-pay | Admitting: General Surgery

## 2021-02-13 NOTE — Anesthesia Postprocedure Evaluation (Signed)
Anesthesia Post Note  Patient: Elizabeth Norman  Procedure(s) Performed: WIDE LOCAL EXCISION RIGHT LOWER LEG MELANOMA , ADVANCEMENT FLAP CLOSURE DEFECT (Right: Leg Lower) SENTINEL NODE BIOPSY (Right: Leg Upper)     Patient location during evaluation: PACU Anesthesia Type: General Level of consciousness: awake and alert Pain management: pain level controlled Vital Signs Assessment: post-procedure vital signs reviewed and stable Respiratory status: spontaneous breathing, nonlabored ventilation and respiratory function stable Cardiovascular status: blood pressure returned to baseline and stable Postop Assessment: no apparent nausea or vomiting Anesthetic complications: no   No notable events documented.  Last Vitals:  Vitals:   02/12/21 1339 02/12/21 1352  BP: 132/66 (!) 117/59  Pulse: 72 75  Resp: 16 16  Temp:  36.6 C  SpO2: 95% 93%    Last Pain:  Vitals:   02/12/21 1352  TempSrc:   PainSc: 3    Pain Goal:                   Merlinda Frederick

## 2021-02-19 LAB — SURGICAL PATHOLOGY

## 2021-03-11 ENCOUNTER — Other Ambulatory Visit: Payer: Self-pay | Admitting: General Surgery

## 2021-03-11 ENCOUNTER — Other Ambulatory Visit (HOSPITAL_COMMUNITY): Payer: Self-pay | Admitting: General Surgery

## 2021-03-11 DIAGNOSIS — C4371 Malignant melanoma of right lower limb, including hip: Secondary | ICD-10-CM

## 2021-03-19 DIAGNOSIS — M810 Age-related osteoporosis without current pathological fracture: Secondary | ICD-10-CM | POA: Diagnosis not present

## 2021-03-19 DIAGNOSIS — E7849 Other hyperlipidemia: Secondary | ICD-10-CM | POA: Diagnosis not present

## 2021-03-19 DIAGNOSIS — I10 Essential (primary) hypertension: Secondary | ICD-10-CM | POA: Diagnosis not present

## 2021-03-22 ENCOUNTER — Ambulatory Visit (HOSPITAL_COMMUNITY)
Admission: RE | Admit: 2021-03-22 | Discharge: 2021-03-22 | Disposition: A | Discharge status: Home / Self Care | Payer: Medicare Other | Source: Ambulatory Visit | Attending: General Surgery | Admitting: General Surgery

## 2021-03-22 DIAGNOSIS — C4371 Malignant melanoma of right lower limb, including hip: Secondary | ICD-10-CM | POA: Diagnosis not present

## 2021-03-22 LAB — GLUCOSE, CAPILLARY: Glucose-Capillary: 115 mg/dL — ABNORMAL HIGH (ref 70–99)

## 2021-03-22 MED ORDER — FLUDEOXYGLUCOSE F - 18 (FDG) INJECTION
9.0600 | Freq: Once | INTRAVENOUS | Status: AC
  Administered 2021-03-22: 9.06 via INTRAVENOUS

## 2021-03-25 ENCOUNTER — Telehealth: Payer: Self-pay | Admitting: General Surgery

## 2021-03-25 NOTE — Telephone Encounter (Signed)
Left message about PET. Have also forwarded PET to oncology Delton Coombes). She has an appt on Wednesday with him.

## 2021-03-26 NOTE — Progress Notes (Signed)
[facility] [address] [place] [address]   CLINIC:  Medical Oncology/Hematology  CONSULT NOTE  Patient Care Team: Neale Burly, MD as PCP - General (Internal Medicine) Derek Jack, MD as Medical Oncologist (Medical Oncology) Brien Mates, RN as Oncology Nurse Navigator (Oncology)  CHIEF COMPLAINTS/PURPOSE OF CONSULTATION:  Evaluation of malignant melanoma of right posterior calf  HISTORY OF PRESENTING ILLNESS:  [name] 80 y.o. female is here because of malignant melanoma of right posterior calf, at the request of Dr. Barry Dienes.  Today she reports feeling good , and she is accompanied by her son. She began to notice a 1/2 inch raised pink lesion on her right medial calf about 5 months ago. This spot was excised on 02/12/2021 by Dr. Barry Dienes. She denies any current pain, numbness, or redness in her right leg. She denies any recent unintentional weight loss, headaches, new pains, and vision changes. Her appetite is good. She denies history of autoimmune diease. She currently lives at home by herself, and she is able to do all of her typical activities without assistance. 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 She denies smoking history. Her father had colon cancer, her sister had breast and colon cancer, her maternal aunt had breast cancer, and her paternal uncle had colon cancer.   MEDICAL HISTORY:  Past Medical History:  Diagnosis Date   Arthritis    Hypertension     SURGICAL HISTORY: Past Surgical History:  Procedure Laterality Date   CATARACT EXTRACTION W/PHACO Right 10/03/2014   Procedure: CATARACT EXTRACTION PHACO AND INTRAOCULAR LENS PLACEMENT ([place]);  Surgeon: [name], MD;  Location: [facility];  Service: Ophthalmology;  Laterality: Right;  CDE:10.09   CATARACT EXTRACTION W/PHACO Left 10/10/2014   Procedure: CATARACT EXTRACTION PHACO AND INTRAOCULAR LENS PLACEMENT ([facility]);   Surgeon: [name], MD;  Location: [facility];  Service: Ophthalmology;  Laterality: Left;  CDE:8.69   CORNEAL 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   CORNEAL TRANSPLANT Left 2020   DENTAL RESTORATION/EXTRACTION WITH X-RAY     MELANOMA EXCISION Right 02/12/2021   Procedure: WIDE LOCAL EXCISION RIGHT LOWER LEG MELANOMA , ADVANCEMENT FLAP CLOSURE DEFECT;  Surgeon: [name], MD;  Location: [facility];  Service: General;  Laterality: Right;   SENTINEL NODE BIOPSY Right 02/12/2021   Procedure: SENTINEL NODE BIOPSY;  Surgeon: [name], MD;  Location: [facility];  Service: General;  Laterality: Right;    SOCIAL HISTORY: Social History   Socioeconomic History   Marital status: Widowed    Spouse name: Not on file   Number of children: Not on file   Years of education: Not on file   Highest education level: Not on file  Occupational History   Not on file  Tobacco Use   Smoking status: Never   Smokeless tobacco: Never  Vaping Use   Vaping Use: Never used  Substance and Sexual Activity   Alcohol use: No   Drug use: No   Sexual activity: Not on file  Other Topics Concern   Not on file  Social History Narrative   Not on file   Social Determinants of Health   Financial Resource Strain: Not on file  Food Insecurity: Not on file  Transportation Needs: Not on file  Physical Activity: Not on file  Stress: Not on file  Social Connections: Not on file  Intimate Partner Violence: Not on file    FAMILY HISTORY: History reviewed. No pertinent  family history.  ALLERGIES:  is allergic to tape.  MEDICATIONS:  Current Outpatient Medications  Medication Sig Dispense Refill   alendronate (FOSAMAX) 70 MG tablet Take 70 mg by mouth once a week.  0   ALPRAZolam (XANAX) 0.5 MG tablet Take 0.5 mg by mouth 3 (three) times daily.  2   atenolol-chlorthalidone (TENORETIC) 50-25 MG tablet Take 1 tablet by mouth in the morning.     benzonatate (TESSALON) 100 MG capsule Take 1 capsule (100 mg total) by mouth every 8  (eight) hours. 21 capsule 0   cetirizine HCl (ZYRTEC) 1 MG/ML solution Take 5 mLs (5 mg total) by mouth daily. 150 mL 0   diphenhydramine-acetaminophen (TYLENOL PM) 25-500 MG TABS tablet Take 1 tablet by mouth at bedtime as needed (sleep).     fluticasone (FLONASE) 50 MCG/ACT nasal spray Place 2 sprays into both nostrils daily. 16 g 0   oxyCODONE (OXY IR/ROXICODONE) 5 MG immediate release tablet Take 0.5-1 tablets (2.5-5 mg total) by mouth every 6 (six) hours as needed for severe pain. 5 tablet 0   prednisoLONE acetate (PRED FORTE) 1 % ophthalmic suspension Place 1 drop into both eyes See admin instructions. Instill one drop into the right eye twice daily and one drop into the left eye once daily.     calcium carbonate (OS-CAL - DOSED IN MG OF ELEMENTAL CALCIUM) 1250 (500 CA) MG tablet Take 1 tablet by mouth 3 (three) times a week. (Patient not taking: Reported on 03/27/2021)     cholecalciferol (VITAMIN D) 1000 UNITS tablet Take 1,000 Units by mouth 3 (three) times a week. (Patient not taking: Reported on 03/27/2021)     Cyanocobalamin (VITAMIN B-12 PO) Take 1 tablet by mouth 3 (three) times a week. (Patient not taking: Reported on 03/27/2021)     No current facility-administered medications for this visit.    REVIEW OF SYSTEMS:   Review of Systems  Constitutional:  Negative for appetite change, fatigue and unexpected weight change.  Eyes:  Negative for eye problems.  Musculoskeletal:  Negative for myalgias.  Neurological:  Negative for headaches and numbness.  All other systems reviewed and are negative.   PHYSICAL EXAMINATION: ECOG PERFORMANCE STATUS: 1 - Symptomatic but completely ambulatory  Vitals:   03/27/21 0820  BP: 138/60  Pulse: 64  Resp: 18  Temp: 97.6 F (36.4 C)  SpO2: 99%   Filed Weights   03/27/21 0820  Weight: 182 lb 6.4 oz (82.7 kg)   Physical Exam Vitals reviewed.  Constitutional:      Appearance: Normal appearance. She is obese.  Cardiovascular:     Rate  and Rhythm: Normal rate and regular rhythm.     Pulses: Normal pulses.     Heart sounds: Normal heart sounds.  Pulmonary:     Effort: Pulmonary effort is normal.     Breath sounds: Normal breath sounds.  Musculoskeletal:       Legs:  Lymphadenopathy:     Upper Body:     Right upper body: No supraclavicular, axillary or pectoral adenopathy.     Left upper body: No supraclavicular, axillary or pectoral adenopathy.  Neurological:     General: No focal deficit present.     Mental Status: She is alert and oriented to person, place, and time.  Psychiatric:        Mood and Affect: Mood normal.        Behavior: Behavior normal.     LABORATORY DATA:  I have reviewed the data as listed  CBC Latest Ref Rng & Units 02/01/2021 09/29/2014  WBC 4.0 - 10.5 K/uL 6.3 4.9  Hemoglobin 12.0 - 15.0 g/dL 13.8 14.1  Hematocrit 36.0 - 46.0 % 42.0 42.0  Platelets 150 - 400 K/uL 196 177   CMP Latest Ref Rng & Units 02/01/2021 09/29/2014  Glucose 70 - 99 mg/dL 103(H) 108(H)  BUN 8 - 23 mg/dL 15 12  Creatinine 0.44 - 1.00 mg/dL 0.97 0.71  Sodium 135 - 145 mmol/L 136 135  Potassium 3.5 - 5.1 mmol/L 4.2 4.1  Chloride 98 - 111 mmol/L 97(L) 98(L)  CO2 22 - 32 mmol/L 31 30  Calcium 8.9 - 10.3 mg/dL 9.7 9.0  Total Protein 6.5 - 8.1 g/dL 7.4 -  Total Bilirubin 0.3 - 1.2 mg/dL 1.0 -  Alkaline Phos 38 - 126 U/L 55 -  AST 15 - 41 U/L 19 -  ALT 0 - 44 U/L 10 -    RADIOGRAPHIC STUDIES: I have personally reviewed the radiological images as listed and agreed with the findings in the report. NM PET Image Initial (PI) Whole Body (F-18 FDG)  Result Date: 03/23/2021 CLINICAL DATA:  Initial treatment strategy for melanoma the posterior RIGHT calf. EXAM: NUCLEAR MEDICINE PET WHOLE BODY TECHNIQUE: 9.1 mCi F-18 FDG was injected intravenously. Full-ring PET imaging was performed from the head to foot after the radiotracer. CT data was obtained and used for attenuation correction and anatomic localization. Fasting blood  glucose: 115 mg/dl COMPARISON:  None FINDINGS: Mediastinal blood pool activity: SUV max 2.9 HEAD/NECK: Focus of radiotracer activity the posterior to the angle of the RIGHT mandible with SUV max equal 5.3 on image 42. Potential small nodule within the parotid gland tissue at this level. No cutaneous lesions identified in the head or neck. No cervical adenopathy Incidental CT findings: none CHEST: There is intense uniform radiotracer activity within moderately enlarged thyroid gland with SUV max equal 9.5. No hypermetabolic mediastinal nodes. No suspicious pulmonary nodules. Incidental CT findings: Healed anterior RIGHT rib fractures. ABDOMEN/PELVIS: No abnormal radiotracer activity liver. No abnormal activity in the gallbladder. No retroperitoneal or peritoneal nodularity. Incidental CT findings: Multiple gallstones. Atherosclerotic calcification of the aorta. SKELETON: CT extremity section Incidental CT findings: none EXTREMITIES: Three foci intense radiotracer activity within the RIGHT lower extremity. In the medial RIGHT upper thigh nodule just superficial to the thigh musculature measuring 11 mm (image 234/series 4) with SUV max equal 7.6. Intramedullary hypermetabolic activity within the shaft of the mid RIGHT femur with SUV max equal 14.6 on image 258. Intense activity associated with the condylar notch of the distal RIGHT femur. This activity appears to localize to the soft tissue rather than the bone. A more diffuse activity associated with the medial RIGHT calf on image 345 related to excisional biopsy. Finally, there is a focus of activity adjacent to the lateral margin of the RIGHT iliac wing with SUV max equal 4.1 on image 171. No clear measurable nodule lesion. Incidental CT findings: none IMPRESSION: 1. Three foci of intense metabolic activity within the RIGHT lower extremity consistent with metastatic melanoma. Lesions involve the subcutaneous tissue, intramedullary bone of the RIGHT femur as well as  soft tissue activity in the RIGHT condylar notch. 2. Focus of metabolic activity adjacent to the RIGHT iliac wing is indeterminate differential including metastatic melanoma versus trauma. 3. No evidence of visceral metastasis in the chest abdomen pelvis. 4. Focus of intense radiotracer activity at the angle of the RIGHT jaw is favored to localize within the RIGHT parotid gland. Favor  primary parotid neoplasm. 5. Intense metabolic activity within the thyroid gland most suggestive of thyroiditis. Electronically Signed   By: Suzy Bouchard M.D.   On: 03/23/2021 10:40    ASSESSMENT:  Malignant melanoma of right posterior leg (pT4 pN3 M1): - She noticed fleshy lesion 4 months ago on the right leg. - Her PMD did a biopsy which was consistent with melanoma. - Wide local excision and lymph node biopsy by Dr. Barry Dienes on 02/12/2021. - Pathology margins free, nodular type, Breslow's thickness 9.52 mm, Clark level V, deep margins free, ulceration absent, satellitosis absent, mitotic index 2/millimeters squared, LVI negative.  Neurotropism absent.  Tumor infiltrating lymphocytes absent.  Tumor regression not noted.  3 out of 3 positive sentinel lymph nodes for melanoma. - PET CT scan on 03/22/2021 with 3 foci of intense radiotracer activity within the right lower extremity.  In the medial right upper thigh nodule just superficial to thigh musculature measuring 11 mm with SUV 7.6.  Intramedullary hypermetabolic activity within the shaft of the mid right femur with SUV 14.6.  Intense activity associated with condylar notch of the distal right femur, appears to localize to soft tissue rather than the bone.  More diffuse activity associated with the medial right calf related to excision biopsy.  There is a focus of activity adjacent to the lateral margin of the right iliac wing SUV 4.1.  No evidence of visceral metastatic disease in the chest, abdomen or pelvis.   Social/family history: - She is seen with her son today.   She lives by herself at home.  She is independent of ADLs and IADLs.  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  She is non-smoker. - Father had colon cancer.  Sister had breast cancer and colon cancer.  Maternal aunt had breast cancer.  Paternal uncle had colon cancer.   PLAN:  Malignant melanoma of the right posterior leg (pT4 pN3 M1): - We have reviewed pathology report with the patient and her son in detail. - We have reviewed images of the PET scan in detail. - Recommend biopsy of the right femoral lesion.  If it is not feasible, consider biopsy of the right upper thigh nodule. - Recommend NGS testing on the pathology. - Recommend MRI of the brain with and without contrast. - Also recommended port placement.  We will check LDH level. - Metastatic disease confirmed, will consider combination nivolumab and relatlimab every 4 weeks. - RTC after biopsy.   All questions were answered. The patient knows to call the clinic with any problems, questions or concerns.   Derek Jack, MD, 03/27/21 1:05 PM  [place] [phone]   I, Thana Ates, am acting as a scribe for Dr. Derek Jack.  I, Derek Jack MD, have reviewed the above documentation for accuracy and completeness, and I agree with the above.

## 2021-03-27 ENCOUNTER — Inpatient Hospital Stay (HOSPITAL_COMMUNITY): Payer: Medicare Other | Attending: Hematology | Admitting: Hematology

## 2021-03-27 ENCOUNTER — Encounter (HOSPITAL_COMMUNITY): Payer: Self-pay

## 2021-03-27 ENCOUNTER — Encounter (HOSPITAL_COMMUNITY): Payer: Self-pay | Admitting: Hematology

## 2021-03-27 ENCOUNTER — Other Ambulatory Visit: Payer: Self-pay

## 2021-03-27 VITALS — BP 138/60 | HR 64 | Temp 97.6°F | Resp 18 | Ht 62.0 in | Wt 182.4 lb

## 2021-03-27 DIAGNOSIS — Z803 Family history of malignant neoplasm of breast: Secondary | ICD-10-CM | POA: Insufficient documentation

## 2021-03-27 DIAGNOSIS — I10 Essential (primary) hypertension: Secondary | ICD-10-CM | POA: Insufficient documentation

## 2021-03-27 DIAGNOSIS — C439 Malignant melanoma of skin, unspecified: Secondary | ICD-10-CM

## 2021-03-27 DIAGNOSIS — C4371 Malignant melanoma of right lower limb, including hip: Secondary | ICD-10-CM | POA: Diagnosis not present

## 2021-03-27 DIAGNOSIS — Z808 Family history of malignant neoplasm of other organs or systems: Secondary | ICD-10-CM | POA: Insufficient documentation

## 2021-03-27 HISTORY — DX: Malignant melanoma of right lower limb, including hip: C43.71

## 2021-03-27 NOTE — Patient Instructions (Addendum)
[facility] Discharge Instructions  You were seen and examined today by Dr. Delton Coombes. Dr. Delton Coombes is a medical oncologist, meaning that he specializes in cancer diagnoses. Dr. Delton Coombes discussed your past medical history, family history of cancer, and the events that led to you being here today.  You were referred to Dr. Delton Coombes by Dr. Barry Dienes due to melanoma of your right calf. Dr. Barry Dienes removed three lymph nodes during your surgery to remove the melanoma, all of which were positive for melanoma involvement.  Dr. Delton Coombes reviewed your recent PET scan, which revealed a concern for melanoma in your bone. To prove the the melanoma has spread to the bone, a biopsy is recommended. You do not have any organ involvement, so it does not appear to be aggressive.  Dr. Delton Coombes has recommended an MRI of your brain to complete the staging of your melanoma. Dr. Delton Coombes has also recommended additional testing (Next Generation Sequencing or NGS testing) on the tissue that was removed during surgery, there is nothing that you need to do for this, we will utilize the tissue that was removed. Dr. Delton Coombes has also recommended a Port-A-Cath. Because we know there are positive lymph nodes, it is recommended that you have treatment for the cancer. A Port-A-Cath is the safest way to administer treatment.  Melanoma is NOT treated with chemotherapy, but it is treated with Immunotherapy. Immunotherapy triggers your own immune system to recognize and fight cancer. It is given through the Port-A-Cath every 3 to 4 weeks. Because it does not appear to be a highly aggressive, it will likely be well controlled by Immunotherapy.  Follow-up as scheduled.   Thank you for choosing [facility] [place] at [facility] to provide your oncology and hematology care.  To afford each patient quality time with our provider, please arrive at least 15 minutes before your  scheduled appointment time.   If you have a lab appointment with the [facility] please come in thru the Main Entrance and check in at the main information desk.  You need to re-schedule your appointment should you arrive 10 or more minutes late.  We strive to give you quality time with our providers, and arriving late affects you and other patients whose appointments are after yours.  Also, if you no show three or more times for appointments you may be dismissed from the clinic at the providers discretion.     Again, thank you for choosing [facility].  Our hope is that these requests will decrease the amount of time that you wait before being seen by our physicians.       _____________________________________________________________  Should you have questions after your visit to [facility], please contact our office at [phone] and follow the prompts.  Our office hours are 8:00 a.m. and 4:30 p.m. Monday - Friday.  Please note that voicemails left after 4:00 p.m. may not be returned until the following business day.  We are closed weekends and major holidays.  You do have access to a nurse 24-7, just call the main number to the clinic [phone] and do not press any options, hold on the line and a nurse will answer the phone.    For prescription refill requests, have your pharmacy contact our office and allow 72 hours.    Due to Covid, you will need to wear a mask upon entering the hospital. If you do not have a mask, a mask  will be given to you at the Main Entrance upon arrival. For doctor visits, patients may have 1 support person age 75 or older with them. For treatment visits, patients can not have anyone with them due to social distancing guidelines and our immunocompromised population.

## 2021-03-27 NOTE — Progress Notes (Signed)
I met with the patient and her son today during and following visit with Dr. Delton Coombes. Patient given my contact information and encouraged to call with questions and concerns.

## 2021-03-28 ENCOUNTER — Encounter (HOSPITAL_COMMUNITY): Payer: Self-pay

## 2021-03-28 NOTE — Progress Notes (Signed)
Elizabeth Keen, MD  [name] M Not a bone bx that we perform   Would need to see ortho   TS        Previous Messages   ----- Message -----  From: Valli Glance  Sent: 03/27/2021  12:11 PM EST  To: [facility] Procedure Requests  Subject: CT Biopsy                                       CT Biopsy      Reason: Melanoma of skin, R femur lesion, Malignant melonoma identified in R calf      History:  MRI and PET in Computer      Provider: Derek Jack      Contact:[phone]

## 2021-04-01 DIAGNOSIS — Z1231 Encounter for screening mammogram for malignant neoplasm of breast: Secondary | ICD-10-CM | POA: Diagnosis not present

## 2021-04-02 ENCOUNTER — Other Ambulatory Visit (HOSPITAL_COMMUNITY): Payer: Self-pay

## 2021-04-02 ENCOUNTER — Encounter (HOSPITAL_COMMUNITY): Payer: Self-pay

## 2021-04-02 ENCOUNTER — Other Ambulatory Visit: Payer: Self-pay | Admitting: Radiology

## 2021-04-02 DIAGNOSIS — C439 Malignant melanoma of skin, unspecified: Secondary | ICD-10-CM

## 2021-04-02 DIAGNOSIS — C4371 Malignant melanoma of right lower limb, including hip: Secondary | ICD-10-CM | POA: Diagnosis not present

## 2021-04-02 NOTE — Progress Notes (Signed)
Notification received from IR that they are unable to perform bone biopsy as requested by Dr. Delton Coombes. IR recommended orthopedic consult. Orthopedist, Dr. Marlou Sa, recommended MRI R Hip and Knee with and without contrast prior to bone biopsy. Patient made aware and agreeable. MRI schedule pending and ortho referral to Dr. Marlou Sa to be replaced upon MRI schedule.

## 2021-04-03 ENCOUNTER — Other Ambulatory Visit (HOSPITAL_COMMUNITY): Payer: Self-pay | Admitting: Physician Assistant

## 2021-04-05 ENCOUNTER — Ambulatory Visit (HOSPITAL_COMMUNITY)
Admission: RE | Admit: 2021-04-05 | Discharge: 2021-04-05 | Disposition: A | Discharge status: Home / Self Care | Payer: Medicare Other | Source: Ambulatory Visit | Attending: Hematology | Admitting: Hematology

## 2021-04-05 ENCOUNTER — Other Ambulatory Visit: Payer: Self-pay

## 2021-04-05 ENCOUNTER — Encounter (HOSPITAL_COMMUNITY): Payer: Self-pay

## 2021-04-05 DIAGNOSIS — Z452 Encounter for adjustment and management of vascular access device: Secondary | ICD-10-CM | POA: Diagnosis not present

## 2021-04-05 DIAGNOSIS — C439 Malignant melanoma of skin, unspecified: Secondary | ICD-10-CM

## 2021-04-05 DIAGNOSIS — I10 Essential (primary) hypertension: Secondary | ICD-10-CM | POA: Diagnosis not present

## 2021-04-05 HISTORY — PX: IR IMAGING GUIDED PORT INSERTION: IMG5740

## 2021-04-05 MED ORDER — LIDOCAINE-EPINEPHRINE 1 %-1:100000 IJ SOLN
INTRAMUSCULAR | Status: AC | PRN
  Administered 2021-04-05: 20 mL

## 2021-04-05 MED ORDER — FENTANYL CITRATE (PF) 100 MCG/2ML IJ SOLN
INTRAMUSCULAR | Status: AC
  Filled 2021-04-05: qty 2

## 2021-04-05 MED ORDER — FENTANYL CITRATE (PF) 100 MCG/2ML IJ SOLN
INTRAMUSCULAR | Status: AC | PRN
  Administered 2021-04-05 (×2): 50 ug via INTRAVENOUS

## 2021-04-05 MED ORDER — SODIUM CHLORIDE 0.9 % IV SOLN: INTRAVENOUS | Status: DC

## 2021-04-05 MED ORDER — LIDOCAINE-EPINEPHRINE (PF) 2 %-1:200000 IJ SOLN
INTRAMUSCULAR | Status: AC
  Filled 2021-04-05: qty 20

## 2021-04-05 MED ORDER — HEPARIN SOD (PORK) LOCK FLUSH 100 UNIT/ML IV SOLN
INTRAVENOUS | Status: AC
  Filled 2021-04-05: qty 5

## 2021-04-05 MED ORDER — HEPARIN SOD (PORK) LOCK FLUSH 100 UNIT/ML IV SOLN
INTRAVENOUS | Status: AC | PRN
  Administered 2021-04-05: 500 [IU] via INTRAVENOUS

## 2021-04-05 MED ORDER — MIDAZOLAM HCL 2 MG/2ML IJ SOLN
INTRAMUSCULAR | Status: AC | PRN
  Administered 2021-04-05 (×4): 1 mg via INTRAVENOUS

## 2021-04-05 MED ORDER — MIDAZOLAM HCL 2 MG/2ML IJ SOLN
INTRAMUSCULAR | Status: AC
  Filled 2021-04-05: qty 4

## 2021-04-05 NOTE — Consult Note (Signed)
Chief Complaint: Patient was seen in consultation today for Port-A-Cath placement  Referring Physician(s): Katragadda,Sreedhar  Supervising Physician: Michaelle Birks  Patient Status: [facility] - Out-pt  History of Present Illness: Elizabeth Norman is an 80 y.o. female with past medical history of arthritis, hypertension and recently diagnosed metastatic melanoma(original site posterior right calf).  Recent PET scan on 11/11 revealed:  1. Three foci of intense metabolic activity within the RIGHT lower extremity consistent with metastatic melanoma. Lesions involve the subcutaneous tissue, intramedullary bone of the RIGHT femur as well as soft tissue activity in the RIGHT condylar notch. 2. Focus of metabolic activity adjacent to the RIGHT iliac wing is indeterminate differential including metastatic melanoma versus trauma. 3. No evidence of visceral metastasis in the chest abdomen pelvis. 4. Focus of intense radiotracer activity at the angle of the RIGHT jaw is favored to localize within the RIGHT parotid gland. Favor primary parotid neoplasm. 5. Intense metabolic activity within the thyroid gland most suggestive of thyroiditis  She presents today for Port-A-Cath placement to assist with treatment.  Past Medical History:  Diagnosis Date   Arthritis    Hypertension     Past Surgical History:  Procedure Laterality Date   CATARACT EXTRACTION W/PHACO Right 10/03/2014   Procedure: CATARACT EXTRACTION PHACO AND INTRAOCULAR LENS PLACEMENT ([place]);  Surgeon: [name], MD;  Location: [facility];  Service: Ophthalmology;  Laterality: Right;  CDE:10.09   CATARACT EXTRACTION W/PHACO Left 10/10/2014   Procedure: CATARACT EXTRACTION PHACO AND INTRAOCULAR LENS PLACEMENT ([facility]);  Surgeon: [name], MD;  Location: [facility];  Service: Ophthalmology;  Laterality: Left;  CDE:8.69   CORNEAL 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   CORNEAL TRANSPLANT Left 2020   DENTAL RESTORATION/EXTRACTION WITH X-RAY     MELANOMA  EXCISION Right 02/12/2021   Procedure: WIDE LOCAL EXCISION RIGHT LOWER LEG MELANOMA , ADVANCEMENT FLAP CLOSURE DEFECT;  Surgeon: [name], MD;  Location: [facility];  Service: General;  Laterality: Right;   SENTINEL NODE BIOPSY Right 02/12/2021   Procedure: SENTINEL NODE BIOPSY;  Surgeon: [name], MD;  Location: [facility];  Service: General;  Laterality: Right;    Allergies: Tape  Medications: Prior to Admission medications   Medication Sig Start Date End Date Taking? Authorizing Provider  ALPRAZolam Duanne Moron) 0.5 MG tablet Take 0.5 mg by mouth 3 (three) times daily. 08/22/14  Yes [provider]  atenolol-chlorthalidone (TENORETIC) 50-25 MG tablet Take 1 tablet by mouth in the morning. 09/11/20  Yes [provider]  calcium carbonate (OS-CAL - DOSED IN MG OF ELEMENTAL CALCIUM) 1250 (500 CA) MG tablet Take 1 tablet by mouth 3 (three) times a week.   Yes [provider]  cholecalciferol (VITAMIN D) 1000 UNITS tablet Take 1,000 Units by mouth 3 (three) times a week.   Yes [provider]  Cyanocobalamin (VITAMIN B-12 PO) Take 1 tablet by mouth 3 (three) times a week.   Yes [provider]  diphenhydramine-acetaminophen (TYLENOL PM) 25-500 MG TABS tablet Take 1 tablet by mouth at bedtime as needed (sleep).   Yes [provider]  fluticasone (FLONASE) 50 MCG/ACT nasal spray Place 2 sprays into both nostrils daily. 02/26/20  Yes Wurst, [place], PA-C  prednisoLONE acetate (PRED FORTE) 1 % ophthalmic suspension Place 1 drop into both eyes See admin instructions. Instill one drop into the right eye twice daily and one drop into the left eye once daily. 12/18/20  Yes [provider]  alendronate (FOSAMAX) 70 MG tablet Take 70 mg by mouth once a week.  06/24/14   [provider]  benzonatate (TESSALON) 100 MG capsule Take 1 capsule (100 mg total) by mouth every 8 (eight) hours. 02/26/20   Wurst, [place], PA-C  cetirizine HCl (ZYRTEC) 1  MG/ML solution Take 5 mLs (5 mg total) by mouth daily. 02/26/20   Wurst, [place], PA-C  oxyCODONE (OXY IR/ROXICODONE) 5 MG immediate release tablet Take 0.5-1 tablets (2.5-5 mg total) by mouth every 6 (six) hours as needed for severe pain. 02/12/21   [name], MD     History reviewed. No pertinent family history.  Social History   Socioeconomic History   Marital status: Widowed    Spouse name: Not on file   Number of children: Not on file   Years of education: Not on file   Highest education level: Not on file  Occupational History   Not on file  Tobacco Use   Smoking status: Never   Smokeless tobacco: Never  Vaping Use   Vaping Use: Never used  Substance and Sexual Activity   Alcohol use: No   Drug use: No   Sexual activity: Not on file  Other Topics Concern   Not on file  Social History Narrative   Not on file   Social Determinants of Health   Financial Resource Strain: Not on file  Food Insecurity: Not on file  Transportation Needs: Not on file  Physical Activity: Not on file  Stress: Not on file  Social Connections: Not on file      Review of Systems currently denies fever, headache, chest pain, dyspnea, cough, abdominal/back pain, nausea, vomiting or bleeding  Vital Signs: BP 136/61   Pulse 63   Temp (!) 97.5 F (36.4 C) (Oral)   Resp 18   Ht 5\' 2"  (1.575 m)   Wt 182 lb 5.1 oz (82.7 kg)   SpO2 100%   BMI 33.35 kg/m   Physical Exam awake, alert.  Chest clear to auscultation bilaterally.  Heart with regular rate and rhythm.  Abdomen soft, positive bowel sounds, nontender.  Extremities with full range of motion; scar right lower extremity from melanoma resection  Imaging: NM PET Image Initial (PI) Whole Body (F-18 FDG)  Result Date: 03/23/2021 CLINICAL DATA:  Initial treatment strategy for melanoma the posterior RIGHT calf. EXAM: NUCLEAR MEDICINE PET WHOLE BODY TECHNIQUE: 9.1 mCi F-18 FDG was injected intravenously. Full-ring PET imaging was  performed from the head to foot after the radiotracer. CT data was obtained and used for attenuation correction and anatomic localization. Fasting blood glucose: 115 mg/dl COMPARISON:  None FINDINGS: Mediastinal blood pool activity: SUV max 2.9 HEAD/NECK: Focus of radiotracer activity the posterior to the angle of the RIGHT mandible with SUV max equal 5.3 on image 42. Potential small nodule within the parotid gland tissue at this level. No cutaneous lesions identified in the head or neck. No cervical adenopathy Incidental CT findings: none CHEST: There is intense uniform radiotracer activity within moderately enlarged thyroid gland with SUV max equal 9.5. No hypermetabolic mediastinal nodes. No suspicious pulmonary nodules. Incidental CT findings: Healed anterior RIGHT rib fractures. ABDOMEN/PELVIS: No abnormal radiotracer activity liver. No abnormal activity in the gallbladder. No retroperitoneal or peritoneal nodularity. Incidental CT findings: Multiple gallstones. Atherosclerotic calcification of the aorta. SKELETON: CT extremity section Incidental CT findings: none EXTREMITIES: Three foci intense radiotracer activity within the RIGHT lower extremity. In the medial RIGHT upper thigh nodule just superficial to the thigh musculature measuring 11 mm (image 234/series 4) with SUV max equal 7.6. Intramedullary hypermetabolic activity within  the shaft of the mid RIGHT femur with SUV max equal 14.6 on image 258. Intense activity associated with the condylar notch of the distal RIGHT femur. This activity appears to localize to the soft tissue rather than the bone. A more diffuse activity associated with the medial RIGHT calf on image 345 related to excisional biopsy. Finally, there is a focus of activity adjacent to the lateral margin of the RIGHT iliac wing with SUV max equal 4.1 on image 171. No clear measurable nodule lesion. Incidental CT findings: none IMPRESSION: 1. Three foci of intense metabolic activity within  the RIGHT lower extremity consistent with metastatic melanoma. Lesions involve the subcutaneous tissue, intramedullary bone of the RIGHT femur as well as soft tissue activity in the RIGHT condylar notch. 2. Focus of metabolic activity adjacent to the RIGHT iliac wing is indeterminate differential including metastatic melanoma versus trauma. 3. No evidence of visceral metastasis in the chest abdomen pelvis. 4. Focus of intense radiotracer activity at the angle of the RIGHT jaw is favored to localize within the RIGHT parotid gland. Favor primary parotid neoplasm. 5. Intense metabolic activity within the thyroid gland most suggestive of thyroiditis. Electronically Signed   By: Suzy Bouchard M.D.   On: 03/23/2021 10:40    Labs:  CBC: Recent Labs    02/01/21 1423  WBC 6.3  HGB 13.8  HCT 42.0  PLT 196    COAGS: No results for input(s): INR, APTT in the last 8760 hours.  BMP: Recent Labs    02/01/21 1423  NA 136  K 4.2  CL 97*  CO2 31  GLUCOSE 103*  BUN 15  CALCIUM 9.7  CREATININE 0.97  GFRNONAA 59*    LIVER FUNCTION TESTS: Recent Labs    02/01/21 1423  BILITOT 1.0  AST 19  ALT 10  ALKPHOS 55  PROT 7.4  ALBUMIN 3.9    TUMOR MARKERS: No results for input(s): AFPTM, CEA, CA199, CHROMGRNA in the last 8760 hours.  Assessment and Plan: 80 y.o. female with past medical history of arthritis, hypertension and recently diagnosed metastatic melanoma(original site posterior right calf).  Recent PET scan on 11/11 revealed:  1. Three foci of intense metabolic activity within the RIGHT lower extremity consistent with metastatic melanoma. Lesions involve the subcutaneous tissue, intramedullary bone of the RIGHT femur as well as soft tissue activity in the RIGHT condylar notch. 2. Focus of metabolic activity adjacent to the RIGHT iliac wing is indeterminate differential including metastatic melanoma versus trauma. 3. No evidence of visceral metastasis in the chest abdomen  pelvis. 4. Focus of intense radiotracer activity at the angle of the RIGHT jaw is favored to localize within the RIGHT parotid gland. Favor primary parotid neoplasm. 5. Intense metabolic activity within the thyroid gland most suggestive of thyroiditis  She presents today for Port-A-Cath placement to assist with treatment.Risks and benefits of image guided port-a-catheter placement was discussed with the patient including, but not limited to bleeding, infection, pneumothorax, or fibrin sheath development and need for additional procedures.  All of the patient's questions were answered, patient is agreeable to proceed. Consent signed and in chart.     Thank you for this interesting consult.  I greatly enjoyed meeting Elizabeth Norman and look forward to participating in their care.  A copy of this report was sent to the requesting provider on this date.  Electronically Signed: D. Rowe Robert, PA-C 04/05/2021, 8:23 AM   I spent a total of  25 minutes   in face to  face in clinical consultation, greater than 50% of which was counseling/coordinating care for Port-A-Cath placement

## 2021-04-05 NOTE — Procedures (Signed)
Vascular and Interventional Radiology Procedure Note  Patient: Elizabeth Norman DOB: [birth year] Medical Record Number: [id number] Note Date/Time: 04/05/21 10:09 AM   Performing Physician: Michaelle Birks, MD Assistant(s): None  Diagnosis:  Melanoma  Procedure: PORT PLACEMENT  Anesthesia: Conscious Sedation Complications: None Estimated Blood Loss: Minimal  Findings:  Successful right-sided SL port placement, with the tip of the catheter in the proximal right atrium.  Plan: Catheter ready for use.  See detailed procedure note with images in PACS. The patient tolerated the procedure well without incident or complication and was returned to Short Stay in stable condition.    Michaelle Birks, MD [facility] Radiology   Pager. [place]

## 2021-04-08 ENCOUNTER — Telehealth: Payer: Self-pay | Admitting: Orthopedic Surgery

## 2021-04-08 NOTE — Telephone Encounter (Signed)
Pt called asking for a CB to explain exactly what's going to happen at her appt on 04/17/21. Pt wants to make sure it's just a consultation.   [phone]

## 2021-04-08 NOTE — Telephone Encounter (Signed)
Contacted patient and all questions and concerns were addressed.

## 2021-04-10 ENCOUNTER — Other Ambulatory Visit: Payer: Self-pay

## 2021-04-10 ENCOUNTER — Ambulatory Visit (HOSPITAL_COMMUNITY)
Admission: RE | Admit: 2021-04-10 | Discharge: 2021-04-10 | Disposition: A | Discharge status: Home / Self Care | Payer: Medicare Other | Source: Ambulatory Visit | Attending: Hematology | Admitting: Hematology

## 2021-04-10 DIAGNOSIS — C439 Malignant melanoma of skin, unspecified: Secondary | ICD-10-CM | POA: Diagnosis not present

## 2021-04-10 DIAGNOSIS — I619 Nontraumatic intracerebral hemorrhage, unspecified: Secondary | ICD-10-CM | POA: Diagnosis not present

## 2021-04-10 MED ORDER — GADOBUTROL 1 MMOL/ML IV SOLN
8.0000 mL | Freq: Once | INTRAVENOUS | Status: AC | PRN
  Administered 2021-04-10: 8 mL via INTRAVENOUS

## 2021-04-15 ENCOUNTER — Ambulatory Visit (HOSPITAL_COMMUNITY)
Admission: RE | Admit: 2021-04-15 | Discharge: 2021-04-15 | Disposition: A | Discharge status: Home / Self Care | Payer: Medicare Other | Source: Ambulatory Visit | Attending: Hematology | Admitting: Hematology

## 2021-04-15 ENCOUNTER — Other Ambulatory Visit: Payer: Self-pay

## 2021-04-15 DIAGNOSIS — C4371 Malignant melanoma of right lower limb, including hip: Secondary | ICD-10-CM

## 2021-04-15 DIAGNOSIS — C439 Malignant melanoma of skin, unspecified: Secondary | ICD-10-CM | POA: Diagnosis not present

## 2021-04-15 DIAGNOSIS — C7951 Secondary malignant neoplasm of bone: Secondary | ICD-10-CM | POA: Diagnosis not present

## 2021-04-15 DIAGNOSIS — D168 Benign neoplasm of pelvic bones, sacrum and coccyx: Secondary | ICD-10-CM | POA: Diagnosis not present

## 2021-04-15 MED ORDER — GADOBUTROL 1 MMOL/ML IV SOLN
7.0000 mL | Freq: Once | INTRAVENOUS | Status: AC | PRN
  Administered 2021-04-15: 7 mL via INTRAVENOUS

## 2021-04-15 NOTE — Progress Notes (Signed)
[place], [place] [address]   CLINIC:  Medical Oncology/Hematology  PCP:  Neale Burly, MD [place] / [place] [id number] [phone]   REASON FOR VISIT:  Follow-up for malignant melanoma of right posterior calf  PRIOR THERAPY: none  NGS Results: not done  CURRENT THERAPY: under work-up  BRIEF ONCOLOGIC HISTORY:  Oncology History   No history exists.    CANCER STAGING: Cancer Staging  Malignant melanoma of lower leg, right ([place]) Staging form: Melanoma of the Skin, AJCC 8th Edition - Clinical stage from 03/27/2021: Stage IV (cT4a, cN3, cM1) - Unsigned   INTERVAL HISTORY:  [name], a 80 y.o. female, returns for routine follow-up of her malignant melanoma of right posterior calf. Elizabeth Norman was last seen on 03/27/2021.   Today she reports feeling good, and she is accompanied by her daughter-in-law.  Her appetite is good, and she denies fatigue. She denies pain in her knee, hip, and thigh.   REVIEW OF SYSTEMS:  Review of Systems  Constitutional:  Negative for appetite change and fatigue.  Musculoskeletal:  Negative for arthralgias.  All other systems reviewed and are negative.  PAST MEDICAL/SURGICAL HISTORY:  Past Medical History:  Diagnosis Date   Arthritis    Hypertension    Past Surgical History:  Procedure Laterality Date   CATARACT EXTRACTION W/PHACO Right 10/03/2014   Procedure: CATARACT EXTRACTION PHACO AND INTRAOCULAR LENS PLACEMENT ([place]);  Surgeon: [name], MD;  Location: [facility];  Service: Ophthalmology;  Laterality: Right;  CDE:10.09   CATARACT EXTRACTION W/PHACO Left 10/10/2014   Procedure: CATARACT EXTRACTION PHACO AND INTRAOCULAR LENS PLACEMENT ([facility]);  Surgeon: [name], MD;  Location: [facility];  Service: Ophthalmology;  Laterality: Left;  CDE:8.69   CORNEAL 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   CORNEAL TRANSPLANT Left 2020   DENTAL RESTORATION/EXTRACTION WITH X-RAY     IR IMAGING GUIDED PORT  INSERTION  04/05/2021   MELANOMA EXCISION Right 02/12/2021   Procedure: WIDE LOCAL EXCISION RIGHT LOWER LEG MELANOMA , ADVANCEMENT FLAP CLOSURE DEFECT;  Surgeon: [name], MD;  Location: [facility];  Service: General;  Laterality: Right;   SENTINEL NODE BIOPSY Right 02/12/2021   Procedure: SENTINEL NODE BIOPSY;  Surgeon: [name], MD;  Location: [facility];  Service: General;  Laterality: Right;    SOCIAL HISTORY:  Social History   Socioeconomic History   Marital status: Widowed    Spouse name: Not on file   Number of children: Not on file   Years of education: Not on file   Highest education level: Not on file  Occupational History   Not on file  Tobacco Use   Smoking status: Never   Smokeless tobacco: Never  Vaping Use   Vaping Use: Never used  Substance and Sexual Activity   Alcohol use: No   Drug use: No   Sexual activity: Not on file  Other Topics Concern   Not on file  Social History Narrative   Not on file   Social Determinants of Health   Financial Resource Strain: Not on file  Food Insecurity: Not on file  Transportation Needs: Not on file  Physical Activity: Not on file  Stress: Not on file  Social Connections: Not on file  Intimate Partner Violence: Not on file    FAMILY HISTORY:  No family history on file.  CURRENT MEDICATIONS:  Current Outpatient Medications  Medication Sig Dispense Refill   alendronate (FOSAMAX) 70 MG tablet Take 70 mg  by mouth once a week.  0   ALPRAZolam (XANAX) 0.5 MG tablet Take 0.5 mg by mouth 3 (three) times daily.  2   atenolol-chlorthalidone (TENORETIC) 50-25 MG tablet Take 1 tablet by mouth in the morning.     benzonatate (TESSALON) 100 MG capsule Take 1 capsule (100 mg total) by mouth every 8 (eight) hours. 21 capsule 0   calcium carbonate (OS-CAL - DOSED IN MG OF ELEMENTAL CALCIUM) 1250 (500 CA) MG tablet Take 1 tablet by mouth 3 (three) times a week.     cetirizine HCl (ZYRTEC) 1 MG/ML solution Take 5 mLs (5 mg total) by  mouth daily. 150 mL 0   cholecalciferol (VITAMIN D) 1000 UNITS tablet Take 1,000 Units by mouth 3 (three) times a week.     Cyanocobalamin (VITAMIN B-12 PO) Take 1 tablet by mouth 3 (three) times a week.     diphenhydramine-acetaminophen (TYLENOL PM) 25-500 MG TABS tablet Take 1 tablet by mouth at bedtime as needed (sleep).     fluticasone (FLONASE) 50 MCG/ACT nasal spray Place 2 sprays into both nostrils daily. 16 g 0   oxyCODONE (OXY IR/ROXICODONE) 5 MG immediate release tablet Take 0.5-1 tablets (2.5-5 mg total) by mouth every 6 (six) hours as needed for severe pain. 5 tablet 0   prednisoLONE acetate (PRED FORTE) 1 % ophthalmic suspension Place 1 drop into both eyes See admin instructions. Instill one drop into the right eye twice daily and one drop into the left eye once daily.     No current facility-administered medications for this visit.    ALLERGIES:  Allergies  Allergen Reactions   Tape     Pulls skin, please use paper tape    PHYSICAL EXAM:  Performance status (ECOG): 1 - Symptomatic but completely ambulatory  There were no vitals filed for this visit. Wt Readings from Last 3 Encounters:  04/05/21 182 lb 5.1 oz (82.7 kg)  03/27/21 182 lb 6.4 oz (82.7 kg)  02/12/21 181 lb (82.1 kg)   Physical Exam Vitals reviewed.  Constitutional:      Appearance: Normal appearance.  Cardiovascular:     Rate and Rhythm: Normal rate and regular rhythm.     Pulses: Normal pulses.     Heart sounds: Normal heart sounds.  Pulmonary:     Effort: Pulmonary effort is normal.     Breath sounds: Normal breath sounds.  Skin:      Neurological:     General: No focal deficit present.     Mental Status: She is alert and oriented to person, place, and time.  Psychiatric:        Mood and Affect: Mood normal.        Behavior: Behavior normal.     LABORATORY DATA:  I have reviewed the labs as listed.  CBC Latest Ref Rng & Units 02/01/2021 09/29/2014  WBC 4.0 - 10.5 K/uL 6.3 4.9  Hemoglobin  12.0 - 15.0 g/dL 13.8 14.1  Hematocrit 36.0 - 46.0 % 42.0 42.0  Platelets 150 - 400 K/uL 196 177   CMP Latest Ref Rng & Units 02/01/2021 09/29/2014  Glucose 70 - 99 mg/dL 103(H) 108(H)  BUN 8 - 23 mg/dL 15 12  Creatinine 0.44 - 1.00 mg/dL 0.97 0.71  Sodium 135 - 145 mmol/L 136 135  Potassium 3.5 - 5.1 mmol/L 4.2 4.1  Chloride 98 - 111 mmol/L 97(L) 98(L)  CO2 22 - 32 mmol/L 31 30  Calcium 8.9 - 10.3 mg/dL 9.7 9.0  Total Protein 6.5 -  8.1 g/dL 7.4 -  Total Bilirubin 0.3 - 1.2 mg/dL 1.0 -  Alkaline Phos 38 - 126 U/L 55 -  AST 15 - 41 U/L 19 -  ALT 0 - 44 U/L 10 -    DIAGNOSTIC IMAGING:  I have independently reviewed the scans and discussed with the patient. MR Brain W Wo Contrast  Result Date: 04/11/2021 CLINICAL DATA:  Staging of metastatic melanoma EXAM: MRI HEAD WITHOUT AND WITH CONTRAST TECHNIQUE: Multiplanar, multiecho pulse sequences of the brain and surrounding structures were obtained without and with intravenous contrast. CONTRAST:  71m GADAVIST GADOBUTROL 1 MMOL/ML IV SOLN COMPARISON:  None. FINDINGS: Brain: No acute infarct, mass effect or extra-axial collection. No acute or chronic hemorrhage. There is multifocal hyperintense T2-weighted signal within the white matter. Parenchymal volume and CSF spaces are normal. The midline structures are normal. There is no abnormal contrast enhancement. Vascular: Major flow voids are preserved. Skull and upper cervical spine: Normal calvarium and skull base. Visualized upper cervical spine and soft tissues are normal. Sinuses/Orbits:No paranasal sinus fluid levels or advanced mucosal thickening. No mastoid or middle ear effusion. Normal orbits. IMPRESSION: 1. No intracranial metastatic disease. 2. No acute intracranial abnormality. 3. Multifocal hyperintense T2-weighted signal within the white matter, most consistent with chronic microvascular ischemia. Electronically Signed   By: KUlyses JarredM.D.   On: 04/11/2021 03:00   NM PET Image Initial  (PI) Whole Body (F-18 FDG)  Result Date: 03/23/2021 CLINICAL DATA:  Initial treatment strategy for melanoma the posterior RIGHT calf. EXAM: NUCLEAR MEDICINE PET WHOLE BODY TECHNIQUE: 9.1 mCi F-18 FDG was injected intravenously. Full-ring PET imaging was performed from the head to foot after the radiotracer. CT data was obtained and used for attenuation correction and anatomic localization. Fasting blood glucose: 115 mg/dl COMPARISON:  None FINDINGS: Mediastinal blood pool activity: SUV max 2.9 HEAD/NECK: Focus of radiotracer activity the posterior to the angle of the RIGHT mandible with SUV max equal 5.3 on image 42. Potential small nodule within the parotid gland tissue at this level. No cutaneous lesions identified in the head or neck. No cervical adenopathy Incidental CT findings: none CHEST: There is intense uniform radiotracer activity within moderately enlarged thyroid gland with SUV max equal 9.5. No hypermetabolic mediastinal nodes. No suspicious pulmonary nodules. Incidental CT findings: Healed anterior RIGHT rib fractures. ABDOMEN/PELVIS: No abnormal radiotracer activity liver. No abnormal activity in the gallbladder. No retroperitoneal or peritoneal nodularity. Incidental CT findings: Multiple gallstones. Atherosclerotic calcification of the aorta. SKELETON: CT extremity section Incidental CT findings: none EXTREMITIES: Three foci intense radiotracer activity within the RIGHT lower extremity. In the medial RIGHT upper thigh nodule just superficial to the thigh musculature measuring 11 mm (image 234/series 4) with SUV max equal 7.6. Intramedullary hypermetabolic activity within the shaft of the mid RIGHT femur with SUV max equal 14.6 on image 258. Intense activity associated with the condylar notch of the distal RIGHT femur. This activity appears to localize to the soft tissue rather than the bone. A more diffuse activity associated with the medial RIGHT calf on image 345 related to excisional biopsy.  Finally, there is a focus of activity adjacent to the lateral margin of the RIGHT iliac wing with SUV max equal 4.1 on image 171. No clear measurable nodule lesion. Incidental CT findings: none IMPRESSION: 1. Three foci of intense metabolic activity within the RIGHT lower extremity consistent with metastatic melanoma. Lesions involve the subcutaneous tissue, intramedullary bone of the RIGHT femur as well as soft tissue activity in the  RIGHT condylar notch. 2. Focus of metabolic activity adjacent to the RIGHT iliac wing is indeterminate differential including metastatic melanoma versus trauma. 3. No evidence of visceral metastasis in the chest abdomen pelvis. 4. Focus of intense radiotracer activity at the angle of the RIGHT jaw is favored to localize within the RIGHT parotid gland. Favor primary parotid neoplasm. 5. Intense metabolic activity within the thyroid gland most suggestive of thyroiditis. Electronically Signed   By: Suzy Bouchard M.D.   On: 03/23/2021 10:40   IR IMAGING GUIDED PORT INSERTION  Result Date: 04/05/2021 INDICATION: Melanoma. EXAM: IMPLANTED PORT A CATH PLACEMENT WITH ULTRASOUND AND FLUOROSCOPIC GUIDANCE MEDICATIONS: None ANESTHESIA/SEDATION: Moderate (conscious) sedation was employed during this procedure. A total of Versed 4 mg and Fentanyl 100 mcg was administered intravenously. Moderate Sedation Time: 22 minutes. The patient's level of consciousness and vital signs were monitored continuously by radiology nursing throughout the procedure under my direct supervision. FLUOROSCOPY TIME:  0 minutes, 13 seconds (0 mGy) COMPLICATIONS: None immediate. PROCEDURE: The procedure, risks, benefits, and alternatives were explained to the patient. Questions regarding the procedure were encouraged and answered. The patient understands and consents to the procedure. The RIGHT neck and chest were prepped with chlorhexidine in a sterile fashion, and a sterile drape was applied covering the operative  field. Maximum barrier sterile technique with sterile gowns and gloves were used for the procedure. A timeout was performed prior to the initiation of the procedure. Local anesthesia was provided with 1% lidocaine with epinephrine. After creating a small venotomy incision, a micropuncture kit was utilized to access the internal jugular vein under direct, real-time ultrasound guidance. Ultrasound image documentation was performed. The microwire was kinked to measure appropriate catheter length. A subcutaneous port pocket was then created along the upper chest wall utilizing a combination of sharp and blunt dissection. The pocket was irrigated with sterile saline. A single lumen ISP power injectable port was chosen for placement. The 8 Fr catheter was tunneled from the port pocket site to the venotomy incision. The port was placed in the pocket. The external catheter was trimmed to appropriate length. At the venotomy, an 8 Fr peel-away sheath was placed over a guidewire under fluoroscopic guidance. The catheter was then placed through the sheath and the sheath was removed. Final catheter positioning was confirmed and documented with a fluoroscopic spot radiograph. The port was accessed with a Huber needle, aspirated and flushed with heparinized saline. The port pocket incision was closed with interrupted 3-0 Vicryl suture then Dermabond was applied, including at the venotomy incision. Dressings were placed. The patient tolerated the procedure well without immediate post procedural complication. IMPRESSION: Successful placement of a right internal jugular approach power injectable Port-A-Cath. The catheter is ready for immediate use. Michaelle Birks, MD [facility] Electronically Signed   By: Michaelle Birks M.D.   On: 04/05/2021 12:45     ASSESSMENT:  Malignant melanoma of right posterior leg (pT4 pN3 M1): - She noticed fleshy lesion 4 months ago on the right  leg. - Her PMD did a biopsy which was consistent with melanoma. - Wide local excision and lymph node biopsy by Dr. Barry Dienes on 02/12/2021. - Pathology margins free, nodular type, Breslow's thickness 9.52 mm, Clark level V, deep margins free, ulceration absent, satellitosis absent, mitotic index 2/millimeters squared, LVI negative.  Neurotropism absent.  Tumor infiltrating lymphocytes absent.  Tumor regression not noted.  3 out of 3 positive sentinel lymph nodes for melanoma. - PET CT scan on  03/22/2021 with 3 foci of intense radiotracer activity within the right lower extremity.  In the medial right upper thigh nodule just superficial to thigh musculature measuring 11 mm with SUV 7.6.  Intramedullary hypermetabolic activity within the shaft of the mid right femur with SUV 14.6.  Intense activity associated with condylar notch of the distal right femur, appears to localize to soft tissue rather than the bone.  More diffuse activity associated with the medial right calf related to excision biopsy.  There is a focus of activity adjacent to the lateral margin of the right iliac wing SUV 4.1.  No evidence of visceral metastatic disease in the chest, abdomen or pelvis.    Social/family history: - She is seen with her son today.  She lives by herself at home.  She is independent of ADLs and IADLs.  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  She is non-smoker. - Father had colon cancer.  Sister had breast cancer and colon cancer.  Maternal aunt had breast cancer.  Paternal uncle had colon cancer.   PLAN:  Malignant melanoma of the right posterior leg (pT4 pN3 M1): - I have recommended biopsy of the right femoral lesion.  Unfortunately IR could not do it. - She was evaluated by Dr. Marlou Sa who has ordered MRI of right hip and knee. - We have discussed MRI of the brain which did not show any metastatic disease. - MRI of the hip and knee showed distal right femoral diaphysis  metastasis and multiple nodal metastasis anteromedially in the proximal to mid right thigh.  Hypermetabolic activity in the right knee intertrochanteric notch on prior PET/CT shows no clear corresponding enhancing lesion and may relate to small synovial cyst/inflammation. - She will have follow-up with Dr. Marlou Sa tomorrow for biopsy. - She already had port placed. - Will consider starting her on Durvalumab and relatlimab every 4 weeks. - RTC after biopsy. - We will follow-up on NGS testing results to see if she has BRAF mutation.   Orders placed this encounter:  No orders of the defined types were placed in this encounter.    Derek Jack, MD [place] [phone]   I, Thana Ates, am acting as a scribe for Dr. Derek Jack.  I, Derek Jack MD, have reviewed the above documentation for accuracy and completeness, and I agree with the above.

## 2021-04-16 ENCOUNTER — Inpatient Hospital Stay (HOSPITAL_COMMUNITY): Payer: Medicare Other | Attending: Hematology | Admitting: Hematology

## 2021-04-16 ENCOUNTER — Encounter (HOSPITAL_COMMUNITY): Payer: Self-pay | Admitting: Hematology

## 2021-04-16 ENCOUNTER — Inpatient Hospital Stay (HOSPITAL_COMMUNITY): Payer: Medicare Other

## 2021-04-16 VITALS — BP 123/52 | HR 73 | Temp 97.9°F | Resp 20 | Ht 62.0 in | Wt 182.6 lb

## 2021-04-16 DIAGNOSIS — Z79899 Other long term (current) drug therapy: Secondary | ICD-10-CM | POA: Diagnosis not present

## 2021-04-16 DIAGNOSIS — Z800 Family history of malignant neoplasm of digestive organs: Secondary | ICD-10-CM | POA: Diagnosis not present

## 2021-04-16 DIAGNOSIS — C7951 Secondary malignant neoplasm of bone: Secondary | ICD-10-CM | POA: Insufficient documentation

## 2021-04-16 DIAGNOSIS — Z23 Encounter for immunization: Secondary | ICD-10-CM | POA: Diagnosis not present

## 2021-04-16 DIAGNOSIS — Z803 Family history of malignant neoplasm of breast: Secondary | ICD-10-CM | POA: Insufficient documentation

## 2021-04-16 DIAGNOSIS — C439 Malignant melanoma of skin, unspecified: Secondary | ICD-10-CM

## 2021-04-16 DIAGNOSIS — C4371 Malignant melanoma of right lower limb, including hip: Secondary | ICD-10-CM | POA: Diagnosis not present

## 2021-04-16 DIAGNOSIS — C778 Secondary and unspecified malignant neoplasm of lymph nodes of multiple regions: Secondary | ICD-10-CM | POA: Diagnosis not present

## 2021-04-16 DIAGNOSIS — I10 Essential (primary) hypertension: Secondary | ICD-10-CM | POA: Insufficient documentation

## 2021-04-16 MED ORDER — INFLUENZA VAC A&B SA ADJ QUAD 0.5 ML IM PRSY
0.5000 mL | PREFILLED_SYRINGE | Freq: Once | INTRAMUSCULAR | Status: AC
  Administered 2021-04-16: 0.5 mL via INTRAMUSCULAR
  Filled 2021-04-16: qty 0.5

## 2021-04-16 NOTE — Progress Notes (Signed)
Elizabeth Norman presents today for injection per the provider's orders.  Flu shot in left arm administration without incident; injection site WNL; see MAR for injection details.  Patient tolerated procedure well and without incident.  No questions or complaints noted at this time.  Stable during and after injection.

## 2021-04-16 NOTE — Patient Instructions (Signed)
[facility]  Discharge Instructions: Thank you for choosing [facility] to provide your oncology and hematology care.  If you have a lab appointment with the [facility], please come in thru the Main Entrance and check in at the main information desk.  Wear comfortable clothing and clothing appropriate for easy access to any Portacath or PICC line.   We strive to give you quality time with your provider. You may need to reschedule your appointment if you arrive late (15 or more minutes).  Arriving late affects you and other patients whose appointments are after yours.  Also, if you miss three or more appointments without notifying the office, you may be dismissed from the clinic at the provider's discretion.      For prescription refill requests, have your pharmacy contact our office and allow 72 hours for refills to be completed.    Today you received the following chemotherapy and/or immunotherapy agents flu shot      To help prevent nausea and vomiting after your treatment, we encourage you to take your nausea medication as directed.  BELOW ARE SYMPTOMS THAT SHOULD BE REPORTED IMMEDIATELY: *FEVER GREATER THAN 100.4 F (38 C) OR HIGHER *CHILLS OR SWEATING *NAUSEA AND VOMITING THAT IS NOT CONTROLLED WITH YOUR NAUSEA MEDICATION *UNUSUAL SHORTNESS OF BREATH *UNUSUAL BRUISING OR BLEEDING *URINARY PROBLEMS (pain or burning when urinating, or frequent urination) *BOWEL PROBLEMS (unusual diarrhea, constipation, pain near the anus) TENDERNESS IN MOUTH AND THROAT WITH OR WITHOUT PRESENCE OF ULCERS (sore throat, sores in mouth, or a toothache) UNUSUAL RASH, SWELLING OR PAIN  UNUSUAL VAGINAL DISCHARGE OR ITCHING   Items with * indicate a potential emergency and should be followed up as soon as possible or go to the Emergency Department if any problems should occur.  Please show the CHEMOTHERAPY ALERT CARD or IMMUNOTHERAPY ALERT CARD at check-in to the Emergency  Department and triage nurse.  Should you have questions after your visit or need to cancel or reschedule your appointment, please contact [facility] [phone]  and follow the prompts.  Office hours are 8:00 a.m. to 4:30 p.m. Monday - Friday. Please note that voicemails left after 4:00 p.m. may not be returned until the following business day.  We are closed weekends and major holidays. You have access to a nurse at all times for urgent questions. Please call the main number to the clinic [phone] and follow the prompts.  For any non-urgent questions, you may also contact your provider using MyChart. We now offer e-Visits for anyone 78 and older to request care online for non-urgent symptoms. For details visit [email or web address].   Also download the MyChart app! Go to the app store, search "MyChart", open the app, select [facility], and log in with your MyChart username and password.  Due to Covid, a mask is required upon entering the hospital/clinic. If you do not have a mask, one will be given to you upon arrival. For doctor visits, patients may have 1 support person aged 32 or older with them. For treatment visits, patients cannot have anyone with them due to current Covid guidelines and our immunocompromised population.

## 2021-04-16 NOTE — Patient Instructions (Signed)
[facility] at [facility] Discharge Instructions   You were seen and examined today by Dr. Delton Coombes. He reviewed the results of your PET scan. We are awaiting test results from NGS testing. We will see you back in 3 weeks after your biopsy.    Thank you for choosing [facility] at [facility] to provide your oncology and hematology care.  To afford each patient quality time with our provider, please arrive at least 15 minutes before your scheduled appointment time.   If you have a lab appointment with the [facility] please come in thru the Main Entrance and check in at the main information desk.  You need to re-schedule your appointment should you arrive 10 or more minutes late.  We strive to give you quality time with our providers, and arriving late affects you and other patients whose appointments are after yours.  Also, if you no show three or more times for appointments you may be dismissed from the clinic at the providers discretion.     Again, thank you for choosing [facility].  Our hope is that these requests will decrease the amount of time that you wait before being seen by our physicians.       _____________________________________________________________  Should you have questions after your visit to [facility], please contact our office at [phone] and follow the prompts.  Our office hours are 8:00 a.m. and 4:30 p.m. Monday - Friday.  Please note that voicemails left after 4:00 p.m. may not be returned until the following business day.  We are closed weekends and major holidays.  You do have access to a nurse 24-7, just call the main number to the clinic [phone] and do not press any options, hold on the line and a nurse will answer the phone.    For prescription refill requests, have your pharmacy contact our office and allow 72 hours.    Due to Covid, you will need to wear a mask upon entering  the hospital. If you do not have a mask, a mask will be given to you at the Main Entrance upon arrival. For doctor visits, patients may have 1 support person age 35 or older with them. For treatment visits, patients can not have anyone with them due to social distancing guidelines and our immunocompromised population.

## 2021-04-17 ENCOUNTER — Other Ambulatory Visit: Payer: Self-pay

## 2021-04-17 ENCOUNTER — Encounter: Payer: Self-pay | Admitting: Orthopedic Surgery

## 2021-04-17 ENCOUNTER — Ambulatory Visit: Payer: Medicare Other | Admitting: Orthopedic Surgery

## 2021-04-17 DIAGNOSIS — C4371 Malignant melanoma of right lower limb, including hip: Secondary | ICD-10-CM | POA: Diagnosis not present

## 2021-04-17 DIAGNOSIS — M79604 Pain in right leg: Secondary | ICD-10-CM | POA: Diagnosis not present

## 2021-04-17 NOTE — Progress Notes (Signed)
Office Visit Note   Patient: Elizabeth Norman           Date of Birth: Mar 13, 1941           MRN: [id number] Visit Date: 04/17/2021 Requested by: Neale Burly, MD [place],  [place] [id number] PCP: Neale Burly, MD  Subjective: Chief Complaint  Patient presents with   Right Leg - Pain    HPI: Elizabeth Norman is an 80 year old patient with metastatic melanoma.  Here for evaluation for possible biopsy of femoral shaft lesion.  She has had MRI scan of the hip and femur.  The scans are reviewed.  She had melanoma removed from her calf several months ago.  PET scan showed several areas of increased uptake which are imaged today.  Issue at hand is biopsy of the lesion for tissue diagnosis.  Several of the lymph nodes are positive on the PET scan.              ROS: All systems reviewed are negative as they relate to the chief complaint within the history of present illness.  Patient denies  fevers or chills.   Assessment & Plan: Visit Diagnoses:  1. Pain in right leg     Plan: Impression is nodular uptake in side the femoral shaft slightly distally and posterior medial.  No cortical destruction.  Patient is really having no pain walking at this time.  Patient is scheduled to undergo multiple courses of chemotherapy and immunotherapy.  At this time my recommendation would be to hold off on biopsy due to the creating a stress riser in the femur which may accelerate her need for internal fixation.  If a biopsy is mandatory I would recommend going to [facility] which she does not really want to do.  Otherwise I would favor MRI scanning of that femur in 3 months just to assess the bony integrity of the cortical bone and to judge the effectiveness of the chemotherapeutic agents which she will be placed on.  Again a biopsy of this lesion within the intramedullary canal of the femur would be destructive and would create a stress riser which would diminish the structural integrity of the  femur for weightbearing.  Follow-Up Instructions: Return in about 3 months (around 07/16/2021).   Orders:  No orders of the defined types were placed in this encounter.  No orders of the defined types were placed in this encounter.     Procedures: No procedures performed   Clinical Data: No additional findings.  Objective: Vital Signs: There were no vitals taken for this visit.  Physical Exam:   Constitutional: Patient appears well-developed HEENT:  Head: Normocephalic Eyes:EOM are normal Neck: Normal range of motion Cardiovascular: Normal rate Pulmonary/chest: Effort normal Neurologic: Patient is alert Skin: Skin is warm Psychiatric: Patient has normal mood and affect   Ortho Exam: Ortho exam demonstrates full active and passive range of motion of the knee and hip.  No tenderness around the femur.  No effusion in the knee.  Well-healed surgical incision from malignant melanoma excision.  On the calf.  Specialty Comments:  No specialty comments available.  Imaging: No results found.   PMFS History: Patient Active Problem List   Diagnosis Date Noted   Malignant melanoma of lower leg, right ([place]) 03/27/2021   Past Medical History:  Diagnosis Date   Arthritis    Hypertension     History reviewed. No pertinent family history.  Past Surgical History:  Procedure Laterality  Date   CATARACT EXTRACTION W/PHACO Right 10/03/2014   Procedure: CATARACT EXTRACTION PHACO AND INTRAOCULAR LENS PLACEMENT (IOC);  Surgeon: [name], MD;  Location: [facility];  Service: Ophthalmology;  Laterality: Right;  CDE:10.09   CATARACT EXTRACTION W/PHACO Left 10/10/2014   Procedure: CATARACT EXTRACTION PHACO AND INTRAOCULAR LENS PLACEMENT (IOC);  Surgeon: [name], MD;  Location: [facility];  Service: Ophthalmology;  Laterality: Left;  CDE:8.69   CORNEAL 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   CORNEAL TRANSPLANT Left 2020   DENTAL RESTORATION/EXTRACTION WITH X-RAY     IR IMAGING GUIDED PORT  INSERTION  04/05/2021   MELANOMA EXCISION Right 02/12/2021   Procedure: WIDE LOCAL EXCISION RIGHT LOWER LEG MELANOMA , ADVANCEMENT FLAP CLOSURE DEFECT;  Surgeon: [name], MD;  Location: [facility];  Service: General;  Laterality: Right;   SENTINEL NODE BIOPSY Right 02/12/2021   Procedure: SENTINEL NODE BIOPSY;  Surgeon: [name], MD;  Location: [facility];  Service: General;  Laterality: Right;   Social History   Occupational History   Not on file  Tobacco Use   Smoking status: Never   Smokeless tobacco: Never  Vaping Use   Vaping Use: Never used  Substance and Sexual Activity   Alcohol use: No   Drug use: No   Sexual activity: Not on file

## 2021-04-18 NOTE — Addendum Note (Signed)
Addended byLaurann Montana on: 04/18/2021 11:25 AM   Modules accepted: Orders

## 2021-04-20 DIAGNOSIS — C4371 Malignant melanoma of right lower limb, including hip: Secondary | ICD-10-CM | POA: Diagnosis not present

## 2021-04-22 ENCOUNTER — Encounter (HOSPITAL_COMMUNITY): Payer: Self-pay

## 2021-04-24 NOTE — Progress Notes (Signed)
Elizabeth Norman, [place] [address]   CLINIC:  Medical Oncology/Hematology  PCP:  Neale Burly, MD [place] / [place] [address] [phone]   REASON FOR VISIT:  Metastatic malignant melanoma, BRAF V600 negative  PRIOR THERAPY: none  NGS Results: not done  CURRENT THERAPY: under work-up  BRIEF ONCOLOGIC HISTORY:  Oncology History  Malignant melanoma of lower leg, right ([place])  03/27/2021 Initial Diagnosis   Malignant melanoma of lower leg, right ([place])   04/29/2021 -  Chemotherapy   Patient is on Treatment Plan : MELANOMA Nivolumab q28d       CANCER STAGING:  Cancer Staging  Malignant melanoma of lower leg, right ([place]) Staging form: Melanoma of the Skin, AJCC 8th Edition - Clinical stage from 03/27/2021: Stage IV (cT4a, cN3, cM1) - Unsigned   INTERVAL HISTORY:  Elizabeth Norman, a 80 y.o. female, returns for routine follow-up of her malignant melanoma of right posterior calf. Nidhi was last seen on 04/16/21.  Today she reports feeling good, and she is accompanied by her son. She reports baseline dry skin.    REVIEW OF SYSTEMS:  Review of Systems  Constitutional:  Negative for appetite change and fatigue.  All other systems reviewed and are negative.  PAST MEDICAL/SURGICAL HISTORY:  Past Medical History:  Diagnosis Date   Arthritis    Hypertension    Past Surgical History:  Procedure Laterality Date   CATARACT EXTRACTION W/PHACO Right 10/03/2014   Procedure: CATARACT EXTRACTION PHACO AND INTRAOCULAR LENS PLACEMENT ([place]);  Surgeon: [name], MD;  Location: [facility];  Service: Ophthalmology;  Laterality: Right;  CDE:10.09   CATARACT EXTRACTION W/PHACO Left 10/10/2014   Procedure: CATARACT EXTRACTION PHACO AND INTRAOCULAR LENS PLACEMENT ([facility]);  Surgeon: [name], MD;  Location: [facility];  Service: Ophthalmology;  Laterality: Left;  CDE:8.69   CORNEAL 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   CORNEAL TRANSPLANT Left 2020    DENTAL RESTORATION/EXTRACTION WITH X-RAY     IR IMAGING GUIDED PORT INSERTION  04/05/2021   MELANOMA EXCISION Right 02/12/2021   Procedure: WIDE LOCAL EXCISION RIGHT LOWER LEG MELANOMA , ADVANCEMENT FLAP CLOSURE DEFECT;  Surgeon: [name], MD;  Location: [facility];  Service: General;  Laterality: Right;   SENTINEL NODE BIOPSY Right 02/12/2021   Procedure: SENTINEL NODE BIOPSY;  Surgeon: [name], MD;  Location: [facility];  Service: General;  Laterality: Right;    SOCIAL HISTORY:  Social History   Socioeconomic History   Marital status: Widowed    Spouse name: Not on file   Number of children: Not on file   Years of education: Not on file   Highest education level: Not on file  Occupational History   Not on file  Tobacco Use   Smoking status: Never   Smokeless tobacco: Never  Vaping Use   Vaping Use: Never used  Substance and Sexual Activity   Alcohol use: No   Drug use: No   Sexual activity: Not on file  Other Topics Concern   Not on file  Social History Narrative   Not on file   Social Determinants of Health   Financial Resource Strain: Not on file  Food Insecurity: Not on file  Transportation Needs: Not on file  Physical Activity: Not on file  Stress: Not on file  Social Connections: Not on file  Intimate Partner Violence: Not on file    FAMILY HISTORY:  No family history on file.  CURRENT MEDICATIONS:  Current Outpatient  Medications  Medication Sig Dispense Refill   alendronate (FOSAMAX) 70 MG tablet Take 70 mg by mouth once a week.  0   ALPRAZolam (XANAX) 0.5 MG tablet Take 0.5 mg by mouth 3 (three) times daily.  2   atenolol-chlorthalidone (TENORETIC) 50-25 MG tablet Take 1 tablet by mouth in the morning.     calcium carbonate (OS-CAL - DOSED IN MG OF ELEMENTAL CALCIUM) 1250 (500 CA) MG tablet Take 1 tablet by mouth 3 (three) times a week.     cholecalciferol (VITAMIN D) 1000 UNITS tablet Take 1,000 Units by mouth 3 (three) times a week.     Cyanocobalamin  (VITAMIN B-12 PO) Take 1 tablet by mouth 3 (three) times a week.     diphenhydramine-acetaminophen (TYLENOL PM) 25-500 MG TABS tablet Take 1 tablet by mouth at bedtime as needed (sleep).     prednisoLONE acetate (PRED FORTE) 1 % ophthalmic suspension Place 1 drop into both eyes See admin instructions. Instill one drop into the right eye twice daily and one drop into the left eye once daily.     No current facility-administered medications for this visit.    ALLERGIES:  Allergies  Allergen Reactions   Tape     Pulls skin, please use paper tape    PHYSICAL EXAM:  Performance status (ECOG): 1 - Symptomatic but completely ambulatory  Vitals:   04/25/21 1058  BP: (!) 147/63  Pulse: 62  Resp: 18  Temp: (!) 97.2 F (36.2 C)  SpO2: 99%   Wt Readings from Last 3 Encounters:  04/25/21 186 lb 14.4 oz (84.8 kg)  04/16/21 182 lb 9.6 oz (82.8 kg)  04/05/21 182 lb 5.1 oz (82.7 kg)   Physical Exam Vitals reviewed.  Constitutional:      Appearance: Normal appearance.  Cardiovascular:     Rate and Rhythm: Normal rate and regular rhythm.     Pulses: Normal pulses.     Heart sounds: Normal heart sounds.  Pulmonary:     Effort: Pulmonary effort is normal.     Breath sounds: Normal breath sounds.  Neurological:     General: No focal deficit present.     Mental Status: She is alert and oriented to person, place, and time.  Psychiatric:        Mood and Affect: Mood normal.        Behavior: Behavior normal.     LABORATORY DATA:  I have reviewed the labs as listed.  CBC Latest Ref Rng & Units 02/01/2021 09/29/2014  WBC 4.0 - 10.5 K/uL 6.3 4.9  Hemoglobin 12.0 - 15.0 g/dL 13.8 14.1  Hematocrit 36.0 - 46.0 % 42.0 42.0  Platelets 150 - 400 K/uL 196 177   CMP Latest Ref Rng & Units 02/01/2021 09/29/2014  Glucose 70 - 99 mg/dL 103(H) 108(H)  BUN 8 - 23 mg/dL 15 12  Creatinine 0.44 - 1.00 mg/dL 0.97 0.71  Sodium 135 - 145 mmol/L 136 135  Potassium 3.5 - 5.1 mmol/L 4.2 4.1  Chloride 98 -  111 mmol/L 97(L) 98(L)  CO2 22 - 32 mmol/L 31 30  Calcium 8.9 - 10.3 mg/dL 9.7 9.0  Total Protein 6.5 - 8.1 g/dL 7.4 -  Total Bilirubin 0.3 - 1.2 mg/dL 1.0 -  Alkaline Phos 38 - 126 U/L 55 -  AST 15 - 41 U/L 19 -  ALT 0 - 44 U/L 10 -    DIAGNOSTIC IMAGING:  I have independently reviewed the scans and discussed with the patient. MR Brain W  Wo Contrast  Result Date: 04/11/2021 CLINICAL DATA:  Staging of metastatic melanoma EXAM: MRI HEAD WITHOUT AND WITH CONTRAST TECHNIQUE: Multiplanar, multiecho pulse sequences of the brain and surrounding structures were obtained without and with intravenous contrast. CONTRAST:  79m GADAVIST GADOBUTROL 1 MMOL/ML IV SOLN COMPARISON:  None. FINDINGS: Brain: No acute infarct, mass effect or extra-axial collection. No acute or chronic hemorrhage. There is multifocal hyperintense T2-weighted signal within the white matter. Parenchymal volume and CSF spaces are normal. The midline structures are normal. There is no abnormal contrast enhancement. Vascular: Major flow voids are preserved. Skull and upper cervical spine: Normal calvarium and skull base. Visualized upper cervical spine and soft tissues are normal. Sinuses/Orbits:No paranasal sinus fluid levels or advanced mucosal thickening. No mastoid or middle ear effusion. Normal orbits. IMPRESSION: 1. No intracranial metastatic disease. 2. No acute intracranial abnormality. 3. Multifocal hyperintense T2-weighted signal within the white matter, most consistent with chronic microvascular ischemia. Electronically Signed   By: KUlyses JarredM.D.   On: 04/11/2021 03:00   MR HIP RIGHT W WO CONTRAST  Result Date: 04/16/2021 CLINICAL DATA:  Right lower leg melanoma status post wide local excision and sentinel node biopsy 02/12/2021. 3/3 lymph nodes taken from the "right upper leg "positive for melanoma. Staging. EXAM: MRI OF THE RIGHT KNEE WITHOUT AND WITH CONTRAST; MRI OF THE RIGHT HIP WITHOUT AND WITH CONTRAST TECHNIQUE:  Multiplanar, multisequence MR imaging of the right lower extremity from mid buttock through proximal lower leg was performed both before and after administration of intravenous contrast. CONTRAST:  745mGADAVIST GADOBUTROL 1 MMOL/ML IV SOLN COMPARISON:  PET-CT 03/22/2021. No imaging was performed during nuclear medicine sentinel node study 02/12/2021. FINDINGS: Bones/Joint/Cartilage There is an enhancing endosteal lesion posteriorly in the distal femoral diaphysis, corresponding with hypermetabolic lesion on PET-CT and suspicious for an osseous metastasis. This measures 7 x 9 x 6 mm and demonstrates T2 hyperintensity. This lesion is located approximately 13 cm proximal to the medial femoral condyle. No other focal osseous lesions are identified. No significant joint effusions at the hips or ankles. Dedicated imaging of the right knee was not performed. There are small cystic appearing lesions laterally in the right knee intercondylar notch corresponding with mild hypermetabolic activity on PET-CT. No significant associated central enhancement. Ligaments Not relevant for exam/indication. Muscles and Tendons No intramuscular mass, edema, atrophy or abnormal enhancement identified. Soft tissues Postsurgical susceptibility artifact in the right inguinal region and the proximal right thigh anteriorly consistent with previous lymph node dissection. There are several enhancing subcutaneous nodules in the anteromedial aspect of the proximal to mid right thigh, corresponding with hypermetabolic activity on PET CT, and consistent with nodal metastases. The largest is located in the mid right thigh, measuring 1.8 x 1.3 x 1.9 cm. There are at least 3 other smaller lesions more proximally. No distal nodules are identified. There is a heterogeneous presacral mass which demonstrates predominantly high T1 signal and no enhancement following contrast. This measures approximately 6.1 x 6.5 x 6.8 cm and shows no significant enhancement  following contrast. This demonstrated predominately fatty signal on CT, and was not hypermetabolic on PET. Appearance is typical for an incidental presacral myelolipoma. IMPRESSION: 1. Solitary osseous metastasis posteriorly in the distal right femoral diaphysis. 2. Multiple nodal metastases anteromedially in the proximal to mid right thigh. 3. The hypermetabolic activity in the right knee intercondylar notch on prior PET-CT shows no clear corresponding enhancing lesion and may relate to a small synovial cyst or inflammation. 4. Incidental large presacral  myelolipoma. Electronically Signed   By: Richardean Sale M.D.   On: 04/16/2021 15:24   MR KNEE RIGHT W WO CONTRAST  Result Date: 04/16/2021 CLINICAL DATA:  Right lower leg melanoma status post wide local excision and sentinel node biopsy 02/12/2021. 3/3 lymph nodes taken from the "right upper leg "positive for melanoma. Staging. EXAM: MRI OF THE RIGHT KNEE WITHOUT AND WITH CONTRAST; MRI OF THE RIGHT HIP WITHOUT AND WITH CONTRAST TECHNIQUE: Multiplanar, multisequence MR imaging of the right lower extremity from mid buttock through proximal lower leg was performed both before and after administration of intravenous contrast. CONTRAST:  58m GADAVIST GADOBUTROL 1 MMOL/ML IV SOLN COMPARISON:  PET-CT 03/22/2021. No imaging was performed during nuclear medicine sentinel node study 02/12/2021. FINDINGS: Bones/Joint/Cartilage There is an enhancing endosteal lesion posteriorly in the distal femoral diaphysis, corresponding with hypermetabolic lesion on PET-CT and suspicious for an osseous metastasis. This measures 7 x 9 x 6 mm and demonstrates T2 hyperintensity. This lesion is located approximately 13 cm proximal to the medial femoral condyle. No other focal osseous lesions are identified. No significant joint effusions at the hips or ankles. Dedicated imaging of the right knee was not performed. There are small cystic appearing lesions laterally in the right knee  intercondylar notch corresponding with mild hypermetabolic activity on PET-CT. No significant associated central enhancement. Ligaments Not relevant for exam/indication. Muscles and Tendons No intramuscular mass, edema, atrophy or abnormal enhancement identified. Soft tissues Postsurgical susceptibility artifact in the right inguinal region and the proximal right thigh anteriorly consistent with previous lymph node dissection. There are several enhancing subcutaneous nodules in the anteromedial aspect of the proximal to mid right thigh, corresponding with hypermetabolic activity on PET CT, and consistent with nodal metastases. The largest is located in the mid right thigh, measuring 1.8 x 1.3 x 1.9 cm. There are at least 3 other smaller lesions more proximally. No distal nodules are identified. There is a heterogeneous presacral mass which demonstrates predominantly high T1 signal and no enhancement following contrast. This measures approximately 6.1 x 6.5 x 6.8 cm and shows no significant enhancement following contrast. This demonstrated predominately fatty signal on CT, and was not hypermetabolic on PET. Appearance is typical for an incidental presacral myelolipoma. IMPRESSION: 1. Solitary osseous metastasis posteriorly in the distal right femoral diaphysis. 2. Multiple nodal metastases anteromedially in the proximal to mid right thigh. 3. The hypermetabolic activity in the right knee intercondylar notch on prior PET-CT shows no clear corresponding enhancing lesion and may relate to a small synovial cyst or inflammation. 4. Incidental large presacral myelolipoma. Electronically Signed   By: WRichardean SaleM.D.   On: 04/16/2021 15:24   IR IMAGING GUIDED PORT INSERTION  Result Date: 04/05/2021 INDICATION: Melanoma. EXAM: IMPLANTED PORT A CATH PLACEMENT WITH ULTRASOUND AND FLUOROSCOPIC GUIDANCE MEDICATIONS: None ANESTHESIA/SEDATION: Moderate (conscious) sedation was employed during this procedure. A total of  Versed 4 mg and Fentanyl 100 mcg was administered intravenously. Moderate Sedation Time: 22 minutes. The patient's level of consciousness and vital signs were monitored continuously by radiology nursing throughout the procedure under my direct supervision. FLUOROSCOPY TIME:  0 minutes, 13 seconds (0 mGy) COMPLICATIONS: None immediate. PROCEDURE: The procedure, risks, benefits, and alternatives were explained to the patient. Questions regarding the procedure were encouraged and answered. The patient understands and consents to the procedure. The RIGHT neck and chest were prepped with chlorhexidine in a sterile fashion, and a sterile drape was applied covering the operative field. Maximum barrier sterile technique with sterile gowns and  gloves were used for the procedure. A timeout was performed prior to the initiation of the procedure. Local anesthesia was provided with 1% lidocaine with epinephrine. After creating a small venotomy incision, a micropuncture kit was utilized to access the internal jugular vein under direct, real-time ultrasound guidance. Ultrasound image documentation was performed. The microwire was kinked to measure appropriate catheter length. A subcutaneous port pocket was then created along the upper chest wall utilizing a combination of sharp and blunt dissection. The pocket was irrigated with sterile saline. A single lumen ISP power injectable port was chosen for placement. The 8 Fr catheter was tunneled from the port pocket site to the venotomy incision. The port was placed in the pocket. The external catheter was trimmed to appropriate length. At the venotomy, an 8 Fr peel-away sheath was placed over a guidewire under fluoroscopic guidance. The catheter was then placed through the sheath and the sheath was removed. Final catheter positioning was confirmed and documented with a fluoroscopic spot radiograph. The port was accessed with a Huber needle, aspirated and flushed with heparinized  saline. The port pocket incision was closed with interrupted 3-0 Vicryl suture then Dermabond was applied, including at the venotomy incision. Dressings were placed. The patient tolerated the procedure well without immediate post procedural complication. IMPRESSION: Successful placement of a right internal jugular approach power injectable Port-A-Cath. The catheter is ready for immediate use. Michaelle Birks, MD [facility] Electronically Signed   By: Michaelle Birks M.D.   On: 04/05/2021 12:45     ASSESSMENT:  Malignant melanoma of right posterior leg (pT4 pN3 M1): - She noticed fleshy lesion 4 months ago on the right leg. - Her PMD did a biopsy which was consistent with melanoma. - Wide local excision and lymph node biopsy by Dr. Barry Dienes on 02/12/2021. - Pathology margins free, nodular type, Breslow's thickness 9.52 mm, Clark level V, deep margins free, ulceration absent, satellitosis absent, mitotic index 2/millimeters squared, LVI negative.  Neurotropism absent.  Tumor infiltrating lymphocytes absent.  Tumor regression not noted.  3 out of 3 positive sentinel lymph nodes for melanoma. - PET CT scan on 03/22/2021 with 3 foci of intense radiotracer activity within the right lower extremity.  In the medial right upper thigh nodule just superficial to thigh musculature measuring 11 mm with SUV 7.6.  Intramedullary hypermetabolic activity within the shaft of the mid right femur with SUV 14.6.  Intense activity associated with condylar notch of the distal right femur, appears to localize to soft tissue rather than the bone.  More diffuse activity associated with the medial right calf related to excision biopsy.  There is a focus of activity adjacent to the lateral margin of the right iliac wing SUV 4.1.  No evidence of visceral metastatic disease in the chest, abdomen or pelvis. - MRI of the right hip and right knee from 04/16/2021 with solitary bone metastasis  in the distal right femoral diaphysis and multiple nodal metastatic disease anteromedially in the proximal right mid thigh. - NGS testing-BRAF negative, positive for NRAS pathogenic variant exon 3, TERT promoter.  MSI-stable.  MMR-proficient.  [place] 1/2/3 fusion not detected.  KIT mutation negative.  PD-L1 negative.    Social/family history: - She is seen with her son today.  She lives by herself at home.  She is independent of ADLs and IADLs.  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  She is non-smoker. - Father had colon cancer.  Sister had breast cancer and colon cancer.  Maternal aunt had breast cancer.  Paternal uncle had colon cancer.   PLAN:  Malignant melanoma of the right posterior leg (pT4 pN3 M1), BRAF V600 negative: - We have reviewed MRI of the right hip and right knee from 04/16/2021 which showed a solitary osseous metastasis in the distal right femoral diaphysis.  Hypermetabolic activity in the right knee intercondylar notch shows no clear corresponding enhancing lesion.  Multiple nodal metastatic disease anteromedially in the proximal to right mid thigh. - We talked about normal prognosis of metastatic melanoma with treatment intent in palliative setting. - She has BRAF wild-type disease, with no features of aggressive disease.  Hence I have recommended nivolumab-relatlimab rather than single agent PD1 inhibitors.  This fixed dose combination (480 mg nivolumab and 160 mg relatlimab) given every 4 weeks until disease progression.  I have coded or our 43% and CR 16%. - We also discussed side effects of this combination in detail which has more favorable toxicity profile compared to nivolumab and ipilimumab.  We discussed skin itching, fatigue, rash, arthralgias, thyroid abnormalities, diarrhea among others.  We also discussed rare side effects including pneumonitis, hypophysitis and myocarditis. - She already has a port in place.  We will initiate  her treatment after Christmas. - All her questions were answered to her satisfaction. - I will see her on cycle 2-day 1.   Orders placed this encounter:  No orders of the defined types were placed in this encounter.  Total time spent 40 minutes with more than 50% of the time spent face-to-face discussing diagnosis, prognosis, treatment options, side effects, counseling and coordination of care.  Derek Jack, MD [place] [phone]   I, Thana Ates, am acting as a scribe for Dr. Derek Jack.  I, Derek Jack MD, have reviewed the above documentation for accuracy and completeness, and I agree with the above.

## 2021-04-25 ENCOUNTER — Other Ambulatory Visit: Payer: Self-pay

## 2021-04-25 ENCOUNTER — Inpatient Hospital Stay (HOSPITAL_COMMUNITY): Payer: Medicare Other | Admitting: Hematology

## 2021-04-25 VITALS — BP 147/63 | HR 62 | Temp 97.2°F | Resp 18 | Ht 62.0 in | Wt 186.9 lb

## 2021-04-25 DIAGNOSIS — C7951 Secondary malignant neoplasm of bone: Secondary | ICD-10-CM | POA: Diagnosis not present

## 2021-04-25 DIAGNOSIS — I10 Essential (primary) hypertension: Secondary | ICD-10-CM | POA: Diagnosis not present

## 2021-04-25 DIAGNOSIS — C4371 Malignant melanoma of right lower limb, including hip: Secondary | ICD-10-CM

## 2021-04-25 DIAGNOSIS — Z803 Family history of malignant neoplasm of breast: Secondary | ICD-10-CM | POA: Diagnosis not present

## 2021-04-25 DIAGNOSIS — C439 Malignant melanoma of skin, unspecified: Secondary | ICD-10-CM | POA: Diagnosis not present

## 2021-04-25 DIAGNOSIS — Z800 Family history of malignant neoplasm of digestive organs: Secondary | ICD-10-CM | POA: Diagnosis not present

## 2021-04-25 DIAGNOSIS — Z23 Encounter for immunization: Secondary | ICD-10-CM | POA: Diagnosis not present

## 2021-04-25 DIAGNOSIS — Z79899 Other long term (current) drug therapy: Secondary | ICD-10-CM | POA: Diagnosis not present

## 2021-04-25 DIAGNOSIS — C778 Secondary and unspecified malignant neoplasm of lymph nodes of multiple regions: Secondary | ICD-10-CM | POA: Diagnosis not present

## 2021-04-25 NOTE — Progress Notes (Signed)
START ON PATHWAY REGIMEN - Melanoma and Other Skin Cancers     A cycle is every 28 days:     Nivolumab and relatlimab-rmbw   **Always confirm dose/schedule in your pharmacy ordering system**  Patient Characteristics: Melanoma, Cutaneous/Unknown Primary, Distant Metastases or Unresectable Local Recurrence, Unresectable, Asymptomatic, First Line, BRAF V600 Wild Type / BRAF V600 Results Pending or Unknown Disease Classification: Melanoma Disease Subtype: Cutaneous BRAF V600 Mutation Status: BRAF V600 Wild Type (No Mutation) Therapeutic Status: Distant Metastases Metastatic Disease Type: Asymptomatic Line of Therapy: First Line Intent of Therapy: Non-Curative / Palliative Intent, Discussed with Patient

## 2021-04-25 NOTE — Patient Instructions (Signed)
[place] at [facility] Discharge Instructions   You were seen and examined today by Dr. Delton Coombes.  We will start you on a once a month treatment with a combination of immunotherapy drugs - Opdivo and relatlimab.  We will repeat a scan after 3 months of treatment to assess treatment response.   We will schedule you for an education class to go over possible side effects of treatment and what to expect before, during, and after treatments.   Return as scheduled.     Thank you for choosing [place] at [facility] to provide your oncology and hematology care.  To afford each patient quality time with our provider, please arrive at least 15 minutes before your scheduled appointment time.   If you have a lab appointment with the [facility] please come in thru the Main Entrance and check in at the main information desk.  You need to re-schedule your appointment should you arrive 10 or more minutes late.  We strive to give you quality time with our providers, and arriving late affects you and other patients whose appointments are after yours.  Also, if you no show three or more times for appointments you may be dismissed from the clinic at the providers discretion.     Again, thank you for choosing [facility].  Our hope is that these requests will decrease the amount of time that you wait before being seen by our physicians.       _____________________________________________________________  Should you have questions after your visit to [facility], please contact our office at [phone] and follow the prompts.  Our office hours are 8:00 a.m. and 4:30 p.m. Monday - Friday.  Please note that voicemails left after 4:00 p.m. may not be returned until the following business day.  We are closed weekends and major holidays.  You do have access to a nurse 24-7, just call the main number to the clinic [phone] and do  not press any options, hold on the line and a nurse will answer the phone.    For prescription refill requests, have your pharmacy contact our office and allow 72 hours.    Due to Covid, you will need to wear a mask upon entering the hospital. If you do not have a mask, a mask will be given to you at the Main Entrance upon arrival. For doctor visits, patients may have 1 support person age 66 or older with them. For treatment visits, patients can not have anyone with them due to social distancing guidelines and our immunocompromised population.

## 2021-04-30 ENCOUNTER — Other Ambulatory Visit: Payer: Self-pay

## 2021-04-30 ENCOUNTER — Encounter (HOSPITAL_COMMUNITY): Payer: Self-pay

## 2021-04-30 ENCOUNTER — Encounter (HOSPITAL_COMMUNITY): Payer: Self-pay | Admitting: Hematology

## 2021-04-30 ENCOUNTER — Inpatient Hospital Stay (HOSPITAL_COMMUNITY): Payer: Medicare Other

## 2021-04-30 DIAGNOSIS — Z95828 Presence of other vascular implants and grafts: Secondary | ICD-10-CM | POA: Insufficient documentation

## 2021-04-30 DIAGNOSIS — C4371 Malignant melanoma of right lower limb, including hip: Secondary | ICD-10-CM

## 2021-04-30 HISTORY — DX: Presence of other vascular implants and grafts: Z95.828

## 2021-04-30 MED ORDER — LIDOCAINE-PRILOCAINE 2.5-2.5 % EX CREA: TOPICAL_CREAM | CUTANEOUS | 3 refills | Status: DC

## 2021-04-30 NOTE — Patient Instructions (Addendum)
Elizabeth Norman are diagnosed with Stage IV melanoma.  We will treat you in the clinic every 4 weeks with a combination of immunotherapy drugs called nivolumab (Opdivo) and relatlimab.  The intent of treatment is to control this disease, keep it from spreading further, and to alleviate any symptoms you may be having related to this cancer.  You will see the doctor regularly throughout treatment.  We will obtain blood work from you prior to every treatment and monitor your results to make sure it is safe to give your treatment. The doctor monitors your response to treatment by the way you are feeling, your blood work, and by obtaining scans periodically.  There will be wait times while you are here for treatment.  It will take about 30 minutes to 1 hour for your lab work to result.  Then there will be wait times while pharmacy mixes your medications.     Opdualag (nivolumab and relatlimab-rmbw) Pronounced: nye-vol'-ue-mab and rel-at-li-mab  Classification: Programmed death receptor-1 (PD-1) blocking antibody (nivolumab) and a lymphocyte activation gene-3 (LAG-3) blocking antibody (relatlimab).  This medication is a combination of the programmed death receptor-1 (PD-1) blocking antibody nivolumab and the LAG-3-blocking antibody relatlimab. It is used for some types of skin cancer.  The immune system works by creating antibodies, which are proteins that attach to antigens found on the surface of a cell. The antibody calls the immune system to attack the cell it is attached to, resulting in the immune system killing the cell. Monoclonal antibodies are created in a lab to attach to the antigens found on specific types of cancer cells. These antibodies can work in different ways, including stimulating the immune system to kill the cell, blocking cell growth or other functions necessary for cell growth. Nivolumab and relatlimab are both monoclonal antibodies.    Nivolumab is a type of monoclonal antibody therapy, which works to stimulate the immune system to destroy cancer cells. T-cells are a type of white blood cell that are very important to the normal functioning of the immune system. Nivolumab works as a form of immunotherapy by binding to the "programmed death receptor" (PD1) found on T-cells to stimulate the immune system to find and kill cancer cells. Relatlimab targets lymphocyte-activation gene 3 (LAG-3), a cell-surface receptor found on some T cells. Targeting LAG3 along with PD1 may help restore T-cells and can help with an anti-tumor immune response.  How to Take nivolumab and relatlimab-rmbw Nivolumab and relatlimab-rmbw is given by a single intravenous (into a vein) infusion (both medications are mixed together and given at the same time). This is a 30 minute infusion you will receive every 4 weeks.  Possible Side Effects of Nivolumab and relatlimab-rmbw  Muscle or Joint Pain/Aches  Fatigue Fatigue is very common during cancer treatment and is an overwhelming feeling of exhaustion that is not usually relieved by rest. While on cancer treatment, and for a period after, you may need to adjust your schedule to manage fatigue. Plan times to rest during the day and conserve energy for more important activities. Exercise can help combat fatigue; a simple daily walk with a friend can help. Talk to your healthcare team for helpful tips on dealing with this side effect.   Low Red Blood Cell Count (Anemia) Your red blood cells are responsible for carrying oxygen to the tissues in your body. When the red cell count is low, you may feel tired or weak. You should let your oncology care  team know if you experience any shortness of breath, difficulty breathing or pain in your chest. If the count gets too low, you may receive a blood transfusion.  Low White Blood Cell Count (Lymphocytopenia) White blood cells (WBC) are important for fighting infection. A  lymphocyte is one kind of white blood cell. While receiving treatment, your WBC count can drop, putting you at a higher risk of getting an infection. You should let your care team know right away if you have a fever (temperature greater than 100.16F or 38C), sore throat or cold, shortness of breath, cough, burning with urination, or a sore that doesn't heal.  Tips for preventing infection:  Washing hands, both yours and your visitors, is the best way to prevent the spread of infection.  Avoid large crowds and people who are sick (i.e.: those who have a cold, fever or cough or live with someone with these symptoms).  When working in your yard, wear protective clothing including long pants and gloves.  Do not handle pet waste.  Keep all cuts or scratches clean.  Shower or bath daily and perform frequent mouth care.  Do not cut cuticles or ingrown nails. You may wear nail polish, but not fake nails.  Ask your oncology care team before scheduling dental appointments or procedures.  Ask your oncology care team before you, or someone you live with, has any vaccinations.   Rash and Itchy Skin (Pruritis) Some patients may develop a rash, scaly skin, or red itchy bumps. Use an alcohol-free moisturizer on your skin and lips; avoid moisturizers with perfumes or scents. Your oncology care team can recommend a topical medication if itching is bothersome. If your skin does crack or bleed, be sure to keep the area clean to avoid infection. Be sure to notify your oncology care team of any rash that develops, as this can be a reaction. They can give you more tips on caring for your skin.   Electrolyte Changes This medication can affect the level of sodium (and possibly other electrolytes) in your body. Your levels will be monitored using blood tests. If your levels become too low, your care team may prescribe specific electrolytes to be given by IV or taken by mouth. Do not take any supplements without  first consulting with your care team.   Less common, but important side effects can include:  Immune Reactions: This medication stimulates your immune system. Your immune system can attack normal organs and tissues in your body, leading to serious or life-threatening complications. It is important to notify your healthcare provider right away if you develop any of the following symptoms:  Diarrhea / Intestinal problems (colitis, inflammation of the bowel): Abdominal pain, diarrhea, cramping, mucus or blood in the stool, dark or tar-like stools, fever. Diarrhea means different things to different people. Any increase in your normal bowel patterns can be defined as diarrhea and should be reported to your healthcare team.  Skin reactions: Report rash, with or without itching (pruritis), sores in your mouth, blistering or peeling skin, as these can become severe and require treatment with corticosteroids.  Lung problems (pneumonitis, inflammation of the lung): New or worsening cough, shortness of breath, trouble breathing, or chest pain.  Liver problems (hepatitis, inflammation of the liver): Yellowing of the skin or eyes, your urine appears dark or brown, pain in your abdomen, bleeding or bruising more easily than normal, or severe nausea and vomiting. Your oncology care team may monitor for liver problems using blood tests called liver  function tests.  Brain and/or nerve problems: Report any headache, drooping of eyelids, double vision, trouble swallowing, weakness of arms, legs, or face, or numbness or tingling in the hands or feet to your healthcare team.  Hormone abnormalities: Immune reactions can affect the pituitary, thyroid, pancreas, and adrenal glands, resulting in inflammation of these glands, which can affect their production of certain hormones. Some hormone levels can be monitored with blood work. It is important that you report any changes in how you are feeling to your care team.  Symptoms of these hormonal changes can include: headaches, nausea, vomiting, constipation, rapid heart rate, increased sweating, extreme fatigue, weakness, changes in your voice, changes in memory and concentration, increased hunger or thirst, increased urination, weight gain, hair loss, dizziness, feeling cold all the time, and changes in mood or behavior (including irritability, forgetfulness and decreased sex drive).    Eye problems: Report any changes in vision, blurry or double vision, and eye pain or redness to your healthcare team.   Kidney problems (kidney inflammation or failure): Decreased urine output, blood in the urine, swelling in the ankles, loss of appetite.   Heart problems: Inflammation in the heart muscle can happen in rare cases. If you develop any of these symptoms, report them to your provider right away: chest pain, shortness of breath, trouble breathing when lying down or waking up gasping for air, heart palpitations (feeling like your heart is racing or skipping a beat), swelling in your feet or legs, lightheadedness or fainting.  Infusion-Related Side Effects: The infusion can cause a reaction that may lead to chills, fever, low blood pressure, nausea, and vomiting. Reactions are most common during the first week of therapy, including the evening after the infusion. Your oncology care team will tell you what to do if this happens.   SELF CARE ACTIVITIES WHILE ON CHEMOTHERAPY/IMMUNOTHERAPY:  Hydration Increase your fluid intake 48 hours prior to treatment and drink at least 8 to 12 cups (64 ounces) of water/decaffeinated beverages per day after treatment. You can still have your cup of coffee or soda but these beverages do not count as part of your 8 to 12 cups that you need to drink daily. No alcohol intake.  Medications Continue taking your normal prescription medication as prescribed.  If you start any new herbal or new supplements please let us know first to make sure it  is safe.  Mouth Care Have teeth cleaned professionally before starting treatment. Keep dentures and partial plates clean. Use soft toothbrush and do not use mouthwashes that contain alcohol. Biotene is a good mouthwash that is available at most pharmacies or may be ordered by calling [phone]. Use warm salt water gargles (1 teaspoon salt per 1 quart warm water) before and after meals and at bedtime. Or you may rinse with 2 tablespoons of three-percent hydrogen peroxide mixed in eight ounces of water. If you are still having problems with your mouth or sores in your mouth please call the clinic. If you need dental work, please let the doctor know before you go for your appointment so that we can coordinate the best possible time for you in regards to your chemo regimen. You need to also let your dentist know that you are actively taking chemo. We may need to do labs prior to your dental appointment.  Skin Care Always use sunscreen that has not expired and with SPF (Sun Protection Factor) of 50 or higher. Wear hats to protect your head from the sun. Remember to  use sunscreen on your hands, ears, face, & feet.  Use good moisturizing lotions such as udder cream, eucerin, or even Vaseline. Some chemotherapies can cause dry skin, color changes in your skin and nails.    Avoid long, hot showers or baths. Use gentle, fragrance-free soaps and laundry detergent. Use moisturizers, preferably creams or ointments rather than lotions because the thicker consistency is better at preventing skin dehydration. Apply the cream or ointment within 15 minutes of showering. Reapply moisturizer at night, and moisturize your hands every time after you wash them.   Infection Prevention Please wash your hands for at least 30 seconds using warm soapy water. Handwashing is the #1 way to prevent the spread of germs. Stay away from sick people or people who are getting over a cold. If you develop respiratory systems such as  green/yellow mucus production or productive cough or persistent cough let us know and we will see if you need an antibiotic. It is a good idea to keep a pair of gloves on when going into grocery stores/[employer] to decrease your risk of coming into contact with germs on the carts, etc. Carry alcohol hand gel with you at all times and use it frequently if out in public. If your temperature reaches 100.5 or higher please call the clinic and let us know.  If it is after hours or on the weekend please go to the ER if your temperature is over 100.4.  Please have your own personal thermometer at home to use.    Sex and bodily fluids If you are going to have sex, a condom must be used to protect the person that isn't taking immunotherapy. For a few days after treatment, immunotherapy can be excreted through your bodily fluids.  When using the toilet please close the lid and flush the toilet twice.  Do this for a few day after you have had immunotherapy.   Contraception It is not known for sure whether or not immunotherapy drugs can be passed on through semen or secretions from the vagina. Because of this some doctors advise people to use a barrier method if you have sex during treatment. This applies to vaginal, anal or oral sex.  Generally, doctors advise a barrier method only for the time you are actually having the treatment and for about a week after your treatment.  Advice like this can be worrying, but this does not mean that you have to avoid being intimate with your partner. You can still have close contact with your partner and continue to enjoy sex.  Animals If you have cats or birds we just ask that you not change the litter or change the cage.  Please have someone else do this for you while you are on immunotherapy.   Food Safety During and After Cancer Treatment Food safety is important for people both during and after cancer treatment. Cancer and cancer treatments, such as chemotherapy,  radiation therapy, and stem cell/bone marrow transplantation, often weaken the immune system. This makes it harder for your body to protect itself from foodborne illness, also called food poisoning. Foodborne illness is caused by eating food that contains harmful bacteria, parasites, or viruses.  Foods to avoid Some foods have a higher risk of becoming tainted with bacteria. These include: Unwashed fresh fruit and vegetables, especially leafy vegetables that can hide dirt and other contaminants Raw sprouts, such as alfalfa sprouts Raw or undercooked beef, especially ground beef, or other raw or undercooked meat and poultry Fatty, fried,  or spicy foods immediately before or after treatment.  These can sit heavy on your stomach and make you feel nauseous. Raw or undercooked shellfish, such as oysters. Sushi and sashimi, which often contain raw fish.  Unpasteurized beverages, such as unpasteurized fruit juices, raw milk, raw yogurt, or cider Undercooked eggs, such as soft boiled, over easy, and poached; raw, unpasteurized eggs; or foods made with raw egg, such as homemade raw cookie dough and homemade mayonnaise  Simple steps for food safety  Shop smart. Do not buy food stored or displayed in an unclean area. Do not buy bruised or damaged fruits or vegetables. Do not buy cans that have cracks, dents, or bulges. Pick up foods that can spoil at the end of your shopping trip and store them in a cooler on the way home.  Prepare and clean up foods carefully. Rinse all fresh fruits and vegetables under running water, and dry them with a clean towel or paper towel. Clean the top of cans before opening them. After preparing food, wash your hands for 20 seconds with hot water and soap. Pay special attention to areas between fingers and under nails. Clean your utensils and dishes with hot water and soap. Disinfect your kitchen and cutting boards using 1 teaspoon of liquid, unscented bleach mixed into 1  quart of water.    Dispose of old food. Eat canned and packaged food before its expiration date (the use by or best before date). Consume refrigerated leftovers within 3 to 4 days. After that time, throw out the food. Even if the food does not smell or look spoiled, it still may be unsafe. Some bacteria, such as Listeria, can grow even on foods stored in the refrigerator if they are kept for too long.  Take precautions when eating out. At restaurants, avoid buffets and salad bars where food sits out for a long time and comes in contact with many people. Food can become contaminated when someone with a virus, often a norovirus, or another bug handles it. Put any leftover food in a to-go container yourself, rather than having the server do it. And, refrigerate leftovers as soon as you get home. Choose restaurants that are clean and that are willing to prepare your food as you order it cooked.    SYMPTOMS TO REPORT AS SOON AS POSSIBLE AFTER TREATMENT:  FEVER GREATER THAN 100.4 F CHILLS WITH OR WITHOUT FEVER NAUSEA AND VOMITING THAT IS NOT CONTROLLED WITH YOUR NAUSEA MEDICATION UNUSUAL SHORTNESS OF BREATH UNUSUAL BRUISING OR BLEEDING TENDERNESS IN MOUTH AND THROAT WITH OR WITHOUT PRESENCE OF ULCERS URINARY PROBLEMS BOWEL PROBLEMS UNUSUAL RASH     Wear comfortable clothing and clothing appropriate for easy access to any Portacath or PICC line. Let us know if there is anything that we can do to make your therapy better!   What to do if you need assistance after hours or on the weekends: CALL [phone].  HOLD on the line, do not hang up.  You will hear multiple messages but at the end you will be connected with a nurse triage line.  They will contact the doctor if necessary.  Most of the time they will be able to assist you.  Do not call the hospital operator.    I have been informed and understand all of the instructions given to me and have received a copy. I have been  instructed to call the clinic [phone] or my family physician as soon as possible for continued medical care,  if indicated. I do not have any more questions at this time but understand that I may call the [facility] or the Patient Navigator at [phone] during office hours should I have questions or need assistance in obtaining follow-up care.

## 2021-04-30 NOTE — Progress Notes (Signed)
Immunotherapy education packet given and discussed with pt and family in detail.  Discussed diagnosis, staging, tx regimen, and intent of tx.  Reviewed immunotherapy medications and side effects.  Instructed on how to manage side effects at home, and when to call the clinic.  Importance of fever/chills discussed with pt and family. Discussed precautions to implement at home after receiving tx, as well as self care strategies. Phone numbers provided for clinic during regular working hours, also how to reach the clinic after hours and on weekends. Pt and family provided the opportunity to ask questions - all questions answered to pt's satisfaction.     

## 2021-05-07 ENCOUNTER — Ambulatory Visit (HOSPITAL_COMMUNITY): Payer: Medicare Other | Admitting: Hematology

## 2021-05-09 ENCOUNTER — Inpatient Hospital Stay (HOSPITAL_COMMUNITY): Payer: Medicare Other

## 2021-05-09 ENCOUNTER — Other Ambulatory Visit: Payer: Self-pay

## 2021-05-09 VITALS — BP 105/57 | HR 61 | Temp 96.7°F | Resp 18

## 2021-05-09 DIAGNOSIS — C4371 Malignant melanoma of right lower limb, including hip: Secondary | ICD-10-CM | POA: Diagnosis not present

## 2021-05-09 DIAGNOSIS — C778 Secondary and unspecified malignant neoplasm of lymph nodes of multiple regions: Secondary | ICD-10-CM | POA: Diagnosis not present

## 2021-05-09 DIAGNOSIS — E876 Hypokalemia: Secondary | ICD-10-CM

## 2021-05-09 DIAGNOSIS — Z800 Family history of malignant neoplasm of digestive organs: Secondary | ICD-10-CM | POA: Diagnosis not present

## 2021-05-09 DIAGNOSIS — Z23 Encounter for immunization: Secondary | ICD-10-CM | POA: Diagnosis not present

## 2021-05-09 DIAGNOSIS — Z79899 Other long term (current) drug therapy: Secondary | ICD-10-CM | POA: Diagnosis not present

## 2021-05-09 DIAGNOSIS — C7951 Secondary malignant neoplasm of bone: Secondary | ICD-10-CM | POA: Diagnosis not present

## 2021-05-09 DIAGNOSIS — Z803 Family history of malignant neoplasm of breast: Secondary | ICD-10-CM | POA: Diagnosis not present

## 2021-05-09 DIAGNOSIS — I10 Essential (primary) hypertension: Secondary | ICD-10-CM | POA: Diagnosis not present

## 2021-05-09 DIAGNOSIS — Z95828 Presence of other vascular implants and grafts: Secondary | ICD-10-CM

## 2021-05-09 LAB — COMPREHENSIVE METABOLIC PANEL
ALT: 11 U/L (ref 0–44)
AST: 20 U/L (ref 15–41)
Albumin: 3.9 g/dL (ref 3.5–5.0)
Alkaline Phosphatase: 63 U/L (ref 38–126)
Anion gap: 9 (ref 5–15)
BUN: 23 mg/dL (ref 8–23)
CO2: 30 mmol/L (ref 22–32)
Calcium: 9 mg/dL (ref 8.9–10.3)
Chloride: 95 mmol/L — ABNORMAL LOW (ref 98–111)
Creatinine, Ser: 1.01 mg/dL — ABNORMAL HIGH (ref 0.44–1.00)
GFR, Estimated: 56 mL/min — ABNORMAL LOW (ref >60)
Glucose, Bld: 115 mg/dL — ABNORMAL HIGH (ref 70–99)
Potassium: 3.3 mmol/L — ABNORMAL LOW (ref 3.5–5.1)
Sodium: 134 mmol/L — ABNORMAL LOW (ref 135–145)
Total Bilirubin: 0.9 mg/dL (ref 0.3–1.2)
Total Protein: 7.4 g/dL (ref 6.5–8.1)

## 2021-05-09 LAB — CBC WITH DIFFERENTIAL/PLATELET
Abs Immature Granulocytes: 0.02 10*3/uL (ref 0.00–0.07)
Basophils Absolute: 0 10*3/uL (ref 0.0–0.1)
Basophils Relative: 1 %
Eosinophils Absolute: 0.1 10*3/uL (ref 0.0–0.5)
Eosinophils Relative: 2 %
HCT: 41.2 % (ref 36.0–46.0)
Hemoglobin: 14 g/dL (ref 12.0–15.0)
Immature Granulocytes: 0 %
Lymphocytes Relative: 25 %
Lymphs Abs: 1.2 10*3/uL (ref 0.7–4.0)
MCH: 31.8 pg (ref 26.0–34.0)
MCHC: 34 g/dL (ref 30.0–36.0)
MCV: 93.6 fL (ref 80.0–100.0)
Monocytes Absolute: 0.4 10*3/uL (ref 0.1–1.0)
Monocytes Relative: 9 %
Neutro Abs: 3 10*3/uL (ref 1.7–7.7)
Neutrophils Relative %: 63 %
Platelets: 161 10*3/uL (ref 150–400)
RBC: 4.4 MIL/uL (ref 3.87–5.11)
RDW: 12.2 % (ref 11.5–15.5)
WBC: 4.8 10*3/uL (ref 4.0–10.5)
nRBC: 0 % (ref 0.0–0.2)

## 2021-05-09 LAB — TSH: TSH: 1.39 u[IU]/mL (ref 0.350–4.500)

## 2021-05-09 MED ORDER — SODIUM CHLORIDE 0.9 % IV SOLN
Freq: Once | INTRAVENOUS | Status: DC
  Filled 2021-05-09: qty 40

## 2021-05-09 MED ORDER — POTASSIUM CHLORIDE CRYS ER 20 MEQ PO TBCR
40.0000 meq | EXTENDED_RELEASE_TABLET | Freq: Once | ORAL | Status: AC
  Administered 2021-05-09: 10:00:00 40 meq via ORAL
  Filled 2021-05-09: qty 2

## 2021-05-09 MED ORDER — SODIUM CHLORIDE 0.9% FLUSH
10.0000 mL | INTRAVENOUS | Status: DC | PRN
  Administered 2021-05-09: 12:00:00 10 mL

## 2021-05-09 MED ORDER — SODIUM CHLORIDE 0.9 % IV SOLN
Freq: Once | INTRAVENOUS | Status: AC
  Filled 2021-05-09: qty 40

## 2021-05-09 MED ORDER — SODIUM CHLORIDE 0.9 % IV SOLN: Freq: Once | INTRAVENOUS | Status: AC

## 2021-05-09 MED ORDER — HEPARIN SOD (PORK) LOCK FLUSH 100 UNIT/ML IV SOLN
500.0000 [IU] | Freq: Once | INTRAVENOUS | Status: AC | PRN
  Administered 2021-05-09: 12:00:00 500 [IU]

## 2021-05-09 NOTE — Progress Notes (Signed)
Patient presents today for chemotherapy infusion.  Patient is in satisfactory condition with no complaints voiced.  Vital signs are stable.  Labs reviewed.  Potassium today is 3.3.  We will give 40 mEq PO x one dose today per Dr. Tomie China standing orders.  We will also give 500 mL of NS over 30 minutes per Dr. Delton Coombes.  All other labs are within treatment parameters.  We will proceed with treatment per MD orders.   Patient tolerated treatment well with no complaints voiced.  Patient left ambulatory in stable condition.  Vital signs stable at discharge.  Follow up as scheduled.

## 2021-05-09 NOTE — Patient Instructions (Signed)
[facility]  Discharge Instructions: Thank you for choosing [facility] to provide your oncology and hematology care.  If you have a lab appointment with the Cancer Center, please come in thru the Main Entrance and check in at the main information desk.  Wear comfortable clothing and clothing appropriate for easy access to any Portacath or PICC line.   We strive to give you quality time with your provider. You may need to reschedule your appointment if you arrive late (15 or more minutes).  Arriving late affects you and other patients whose appointments are after yours.  Also, if you miss three or more appointments without notifying the office, you may be dismissed from the clinic at the provider's discretion.      For prescription refill requests, have your pharmacy contact our office and allow 72 hours for refills to be completed.        To help prevent nausea and vomiting after your treatment, we encourage you to take your nausea medication as directed.  BELOW ARE SYMPTOMS THAT SHOULD BE REPORTED IMMEDIATELY: *FEVER GREATER THAN 100.4 F (38 C) OR HIGHER *CHILLS OR SWEATING *NAUSEA AND VOMITING THAT IS NOT CONTROLLED WITH YOUR NAUSEA MEDICATION *UNUSUAL SHORTNESS OF BREATH *UNUSUAL BRUISING OR BLEEDING *URINARY PROBLEMS (pain or burning when urinating, or frequent urination) *BOWEL PROBLEMS (unusual diarrhea, constipation, pain near the anus) TENDERNESS IN MOUTH AND THROAT WITH OR WITHOUT PRESENCE OF ULCERS (sore throat, sores in mouth, or a toothache) UNUSUAL RASH, SWELLING OR PAIN  UNUSUAL VAGINAL DISCHARGE OR ITCHING   Items with * indicate a potential emergency and should be followed up as soon as possible or go to the Emergency Department if any problems should occur.  Please show the CHEMOTHERAPY ALERT CARD or IMMUNOTHERAPY ALERT CARD at check-in to the Emergency Department and triage nurse.  Should you have questions after your visit or need to cancel  or reschedule your appointment, please contact [facility] [phone]  and follow the prompts.  Office hours are 8:00 a.m. to 4:30 p.m. Monday - Friday. Please note that voicemails left after 4:00 p.m. may not be returned until the following business day.  We are closed weekends and major holidays. You have access to a nurse at all times for urgent questions. Please call the main number to the clinic [phone] and follow the prompts.  For any non-urgent questions, you may also contact your provider using MyChart. We now offer e-Visits for anyone 18 and older to request care online for non-urgent symptoms. For details visit [email or web address].   Also download the MyChart app! Go to the app store, search "MyChart", open the app, select [facility], and log in with your MyChart username and password.  Due to Covid, a mask is required upon entering the hospital/clinic. If you do not have a mask, one will be given to you upon arrival. For doctor visits, patients may have 1 support person aged 18 or older with them. For treatment visits, patients cannot have anyone with them due to current Covid guidelines and our immunocompromised population.  

## 2021-05-10 ENCOUNTER — Telehealth (HOSPITAL_COMMUNITY): Payer: Self-pay

## 2021-05-10 NOTE — Telephone Encounter (Signed)
24 hour call back:  Patient has no complaints.  She states she feels well with no side effects or symptoms from chemotherapy.  Patient advised to call with any concerns.

## 2021-05-15 NOTE — Progress Notes (Signed)
[name],  [id number]   CLINIC:  Medical Oncology/Hematology  PCP:  Neale Burly, MD [place] / [place] [address] [phone]   REASON FOR VISIT:  Follow-up for metastatic malignant melanoma, BRAF V600 negative  PRIOR THERAPY: none  NGS Results: not done  CURRENT THERAPY: Opdualag (nivolumab/relatlimab) q28d  BRIEF ONCOLOGIC HISTORY:  Oncology History  Malignant melanoma of lower leg, right ([place])  03/27/2021 Initial Diagnosis   Malignant melanoma of lower leg, right ([place])   05/09/2021 -  Chemotherapy   Patient is on Treatment Plan : [place] (nivolumab/relatlimab) q28d       CANCER STAGING:  Cancer Staging  Malignant melanoma of lower leg, right ([place]) Staging form: Melanoma of the Skin, AJCC 8th Edition - Clinical stage from 03/27/2021: Stage IV (cT4a, cN3, cM1) - Unsigned   INTERVAL HISTORY:  [name], a 81 y.o. female, returns for routine follow-up of her metastatic malignant melanoma, BRAF V600 negative. Demonica was last seen on 04/25/2021.   Today she reports feeling good. She reports a sore throat and hoarseness since 1/3 for which she has been taking tylenol. She reports fatigue this morning. She reports intermittent headache.  She denies fever and nausea. She reports 1 episode of diarrhea this morning. She denies cough and skin rash. Her energy has been good.   REVIEW OF SYSTEMS:  Review of Systems  Constitutional:  Negative for appetite change, fatigue and fever.  HENT:   Positive for sore throat and voice change (hoarse).   Respiratory:  Negative for cough.   Gastrointestinal:  Positive for diarrhea (x1). Negative for nausea.  Skin:  Negative for rash.  Neurological:  Positive for headaches.  All other systems reviewed and are negative.  PAST MEDICAL/SURGICAL HISTORY:  Past Medical History:  Diagnosis Date   Arthritis    Hypertension    Port-A-Cath in place 04/30/2021    Past Surgical History:  Procedure Laterality Date   CATARACT EXTRACTION W/PHACO Right 10/03/2014   Procedure: CATARACT EXTRACTION PHACO AND INTRAOCULAR LENS PLACEMENT ([place]);  Surgeon: [name], MD;  Location: [facility];  Service: Ophthalmology;  Laterality: Right;  CDE:10.09   CATARACT EXTRACTION W/PHACO Left 10/10/2014   Procedure: CATARACT EXTRACTION PHACO AND INTRAOCULAR LENS PLACEMENT ([facility]);  Surgeon: [name], MD;  Location: [facility];  Service: Ophthalmology;  Laterality: Left;  CDE:8.69   CORNEAL 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   CORNEAL TRANSPLANT Left 2020   DENTAL RESTORATION/EXTRACTION WITH X-RAY     IR IMAGING GUIDED PORT INSERTION  04/05/2021   MELANOMA EXCISION Right 02/12/2021   Procedure: WIDE LOCAL EXCISION RIGHT LOWER LEG MELANOMA , ADVANCEMENT FLAP CLOSURE DEFECT;  Surgeon: [name], MD;  Location: [facility];  Service: General;  Laterality: Right;   SENTINEL NODE BIOPSY Right 02/12/2021   Procedure: SENTINEL NODE BIOPSY;  Surgeon: [name], MD;  Location: [facility];  Service: General;  Laterality: Right;    SOCIAL HISTORY:  Social History   Socioeconomic History   Marital status: Widowed    Spouse name: Not on file   Number of children: Not on file   Years of education: Not on file   Highest education level: Not on file  Occupational History   Not on file  Tobacco Use   Smoking status: Never   Smokeless tobacco: Never  Vaping Use   Vaping Use: Never used  Substance and Sexual Activity   Alcohol use: No   Drug use: No  Sexual activity: Not on file  Other Topics Concern   Not on file  Social History Narrative   Not on file   Social Determinants of Health   Financial Resource Strain: Not on file  Food Insecurity: Not on file  Transportation Needs: Not on file  Physical Activity: Not on file  Stress: Not on file  Social Connections: Not on file  Intimate Partner Violence: Not on file    FAMILY HISTORY:  No family history on file.  CURRENT  MEDICATIONS:  Current Outpatient Medications  Medication Sig Dispense Refill   alendronate (FOSAMAX) 70 MG tablet Take 70 mg by mouth once a week.  0   ALPRAZolam (XANAX) 0.5 MG tablet Take 0.5 mg by mouth 3 (three) times daily.  2   atenolol-chlorthalidone (TENORETIC) 50-25 MG tablet Take 1 tablet by mouth in the morning.     calcium carbonate (OS-CAL - DOSED IN MG OF ELEMENTAL CALCIUM) 1250 (500 CA) MG tablet Take 1 tablet by mouth 3 (three) times a week.     cholecalciferol (VITAMIN D) 1000 UNITS tablet Take 1,000 Units by mouth 3 (three) times a week.     Cyanocobalamin (VITAMIN B-12 PO) Take 1 tablet by mouth 3 (three) times a week.     lidocaine-prilocaine (EMLA) cream Apply a small amount to port a cath site and cover with plastic wrap 1 hour prior to port flush appointments 30 g 3   Nivolumab-Relatlimab-rmbw (OPDUALAG) 240-80 MG/20ML SOLN Inject into the vein every 28 (twenty-eight) days.     prednisoLONE acetate (PRED FORTE) 1 % ophthalmic suspension Place 1 drop into both eyes See admin instructions. Instill one drop into the right eye twice daily and one drop into the left eye once daily.     diphenhydramine-acetaminophen (TYLENOL PM) 25-500 MG TABS tablet Take 1 tablet by mouth at bedtime as needed (sleep). (Patient not taking: Reported on 05/16/2021)     No current facility-administered medications for this visit.    ALLERGIES:  Allergies  Allergen Reactions   Tape     Pulls skin, please use paper tape    PHYSICAL EXAM:  Performance status (ECOG): 1 - Symptomatic but completely ambulatory  Vitals:   05/16/21 1037  BP: 122/86  Pulse: 80  Resp: 18  Temp: 98.7 F (37.1 C)  SpO2: 97%   Wt Readings from Last 3 Encounters:  05/16/21 184 lb 12.8 oz (83.8 kg)  04/25/21 186 lb 14.4 oz (84.8 kg)  04/16/21 182 lb 9.6 oz (82.8 kg)   Physical Exam Vitals reviewed.  Constitutional:      Appearance: Normal appearance.  Cardiovascular:     Rate and Rhythm: Normal rate and  regular rhythm.     Pulses: Normal pulses.     Heart sounds: Normal heart sounds.  Pulmonary:     Effort: Pulmonary effort is normal.     Breath sounds: Normal breath sounds.  Neurological:     General: No focal deficit present.     Mental Status: She is alert and oriented to person, place, and time.  Psychiatric:        Mood and Affect: Mood normal.        Behavior: Behavior normal.     LABORATORY DATA:  I have reviewed the labs as listed.  CBC Latest Ref Rng & Units 05/09/2021 02/01/2021 09/29/2014  WBC 4.0 - 10.5 K/uL 4.8 6.3 4.9  Hemoglobin 12.0 - 15.0 g/dL 14.0 13.8 14.1  Hematocrit 36.0 - 46.0 % 41.2 42.0 42.0  Platelets 150 - 400 K/uL 161 196 177   CMP Latest Ref Rng & Units 05/09/2021 02/01/2021 09/29/2014  Glucose 70 - 99 mg/dL 115(H) 103(H) 108(H)  BUN 8 - 23 mg/dL _0 Creatinine 0.44 - 1.00 mg/dL 1.01(H) 0.97 0.71  Sodium 135 - 145 mmol/L 134(L) 136 135  Potassium 3.5 - 5.1 mmol/L 3.3(L) 4.2 4.1  Chloride 98 - 111 mmol/L 95(L) 97(L) 98(L)  CO2 22 - 32 mmol/L _1 Calcium 8.9 - 10.3 mg/dL 9.0 9.7 9.0  Total Protein 6.5 - 8.1 g/dL 7.4 7.4 -  Total Bilirubin 0.3 - 1.2 mg/dL 0.9 1.0 -  Alkaline Phos 38 - 126 U/L 63 55 -  AST 15 - 41 U/L 20 19 -  ALT 0 - 44 U/L 11 10 -    DIAGNOSTIC IMAGING:  I have independently reviewed the scans and discussed with the patient. No results found.   ASSESSMENT:  Malignant melanoma of right posterior leg (pT4 pN3 M1): - She noticed fleshy lesion 4 months ago on the right leg. - Her PMD did a biopsy which was consistent with melanoma. - Wide local excision and lymph node biopsy by Dr. Barry Dienes on 02/12/2021. - Pathology margins free, nodular type, Breslow's thickness 9.52 mm, Clark level V, deep margins free, ulceration absent, satellitosis absent, mitotic index 2/millimeters squared, LVI negative.  Neurotropism absent.  Tumor infiltrating lymphocytes absent.  Tumor regression not noted.  3 out of 3 positive sentinel lymph nodes  for melanoma. - PET CT scan on 03/22/2021 with 3 foci of intense radiotracer activity within the right lower extremity.  In the medial right upper thigh nodule just superficial to thigh musculature measuring 11 mm with SUV 7.6.  Intramedullary hypermetabolic activity within the shaft of the mid right femur with SUV 14.6.  Intense activity associated with condylar notch of the distal right femur, appears to localize to soft tissue rather than the bone.  More diffuse activity associated with the medial right calf related to excision biopsy.  There is a focus of activity adjacent to the lateral margin of the right iliac wing SUV 4.1.  No evidence of visceral metastatic disease in the chest, abdomen or pelvis. 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 - NGS testing-BRAF negative, positive for NRAS pathogenic variant exon 3, TERT promoter.  MSI-stable.  MMR-proficient.  [place] 1/2/3 fusion not detected.  KIT mutation negative.  PD-L1 negative. - Nivolumab-Relatlimab every 28 days started on 05/09/2021.    Social/family history: - She is seen with her son today.  She lives by herself at home.  She is independent of ADLs and IADLs.  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  She is non-smoker. - Father had colon cancer.  Sister had breast cancer and colon cancer.  Maternal aunt had breast cancer.  Paternal uncle had colon cancer.   PLAN:  Malignant melanoma of the right posterior leg (pT4 pN3 M1), BRAF V600 negative: - She has received immunotherapy combination last week. - She did not report any immunotherapy related side effects. - On 05/14/2020 she developed a sore throat and some headaches. - Headaches have gotten better when she took liquid Tylenol. - She felt somewhat weak this morning but feels  better now.  No fever or chills. - Reviewed labs today with  white count 5.3 and normal differential.  Hemoglobin and platelets are normal.  LFTs are normal. - Physical examination did not reveal any signs of infection at this time.  He does not have nasal discharge. - She was told to call us if she has any greenish discharge or develops cough or temperature at home.  We will start her on some antibiotics. - She will come back in 3 weeks for follow-up prior to her next cycle.  2.  Mild CKD: - Creatinine is 1.02 and stable.  We will closely monitor. - She also has mild hypokalemia which is stable.  If there is any worsening consider starting potassium.  3.  Bone disease: - She has a solitary osseous metastasis posteriorly in the distal right femoral diaphysis. - If there is any worsening, will consider bisphosphonates.  Orders placed this encounter:  No orders of the defined types were placed in this encounter.    Derek Jack, MD [place] [phone]   I, Thana Ates, am acting as a scribe for Dr. Derek Jack.  I, Derek Jack MD, have reviewed the above documentation for accuracy and completeness, and I agree with the above.

## 2021-05-16 ENCOUNTER — Inpatient Hospital Stay (HOSPITAL_COMMUNITY): Payer: Medicare Other | Attending: Hematology

## 2021-05-16 ENCOUNTER — Inpatient Hospital Stay (HOSPITAL_BASED_OUTPATIENT_CLINIC_OR_DEPARTMENT_OTHER): Payer: Medicare Other | Admitting: Hematology

## 2021-05-16 ENCOUNTER — Other Ambulatory Visit: Payer: Self-pay

## 2021-05-16 VITALS — BP 122/86 | HR 80 | Temp 98.7°F | Resp 18 | Ht 62.0 in | Wt 184.8 lb

## 2021-05-16 DIAGNOSIS — N182 Chronic kidney disease, stage 2 (mild): Secondary | ICD-10-CM | POA: Diagnosis not present

## 2021-05-16 DIAGNOSIS — I514 Myocarditis, unspecified: Secondary | ICD-10-CM | POA: Diagnosis not present

## 2021-05-16 DIAGNOSIS — C7951 Secondary malignant neoplasm of bone: Secondary | ICD-10-CM | POA: Insufficient documentation

## 2021-05-16 DIAGNOSIS — C439 Malignant melanoma of skin, unspecified: Secondary | ICD-10-CM | POA: Diagnosis not present

## 2021-05-16 DIAGNOSIS — Z800 Family history of malignant neoplasm of digestive organs: Secondary | ICD-10-CM | POA: Insufficient documentation

## 2021-05-16 DIAGNOSIS — C4371 Malignant melanoma of right lower limb, including hip: Secondary | ICD-10-CM | POA: Insufficient documentation

## 2021-05-16 DIAGNOSIS — I129 Hypertensive chronic kidney disease with stage 1 through stage 4 chronic kidney disease, or unspecified chronic kidney disease: Secondary | ICD-10-CM | POA: Diagnosis not present

## 2021-05-16 DIAGNOSIS — Z79899 Other long term (current) drug therapy: Secondary | ICD-10-CM | POA: Diagnosis not present

## 2021-05-16 DIAGNOSIS — E876 Hypokalemia: Secondary | ICD-10-CM | POA: Insufficient documentation

## 2021-05-16 DIAGNOSIS — R2241 Localized swelling, mass and lump, right lower limb: Secondary | ICD-10-CM | POA: Diagnosis not present

## 2021-05-16 DIAGNOSIS — Z803 Family history of malignant neoplasm of breast: Secondary | ICD-10-CM | POA: Diagnosis not present

## 2021-05-16 LAB — CBC WITH DIFFERENTIAL/PLATELET
Abs Immature Granulocytes: 0.02 10*3/uL (ref 0.00–0.07)
Basophils Absolute: 0 10*3/uL (ref 0.0–0.1)
Basophils Relative: 1 %
Eosinophils Absolute: 0 10*3/uL (ref 0.0–0.5)
Eosinophils Relative: 0 %
HCT: 38 % (ref 36.0–46.0)
Hemoglobin: 12.5 g/dL (ref 12.0–15.0)
Immature Granulocytes: 0 %
Lymphocytes Relative: 17 %
Lymphs Abs: 0.9 10*3/uL (ref 0.7–4.0)
MCH: 30.3 pg (ref 26.0–34.0)
MCHC: 32.9 g/dL (ref 30.0–36.0)
MCV: 92.2 fL (ref 80.0–100.0)
Monocytes Absolute: 0.5 10*3/uL (ref 0.1–1.0)
Monocytes Relative: 9 %
Neutro Abs: 3.9 10*3/uL (ref 1.7–7.7)
Neutrophils Relative %: 73 %
Platelets: 156 10*3/uL (ref 150–400)
RBC: 4.12 MIL/uL (ref 3.87–5.11)
RDW: 11.9 % (ref 11.5–15.5)
WBC: 5.3 10*3/uL (ref 4.0–10.5)
nRBC: 0 % (ref 0.0–0.2)

## 2021-05-16 LAB — COMPREHENSIVE METABOLIC PANEL
ALT: 13 U/L (ref 0–44)
AST: 19 U/L (ref 15–41)
Albumin: 3.7 g/dL (ref 3.5–5.0)
Alkaline Phosphatase: 74 U/L (ref 38–126)
Anion gap: 8 (ref 5–15)
BUN: 24 mg/dL — ABNORMAL HIGH (ref 8–23)
CO2: 30 mmol/L (ref 22–32)
Calcium: 9.1 mg/dL (ref 8.9–10.3)
Chloride: 94 mmol/L — ABNORMAL LOW (ref 98–111)
Creatinine, Ser: 1.02 mg/dL — ABNORMAL HIGH (ref 0.44–1.00)
GFR, Estimated: 56 mL/min — ABNORMAL LOW (ref >60)
Glucose, Bld: 134 mg/dL — ABNORMAL HIGH (ref 70–99)
Potassium: 3.3 mmol/L — ABNORMAL LOW (ref 3.5–5.1)
Sodium: 132 mmol/L — ABNORMAL LOW (ref 135–145)
Total Bilirubin: 1.1 mg/dL (ref 0.3–1.2)
Total Protein: 7 g/dL (ref 6.5–8.1)

## 2021-05-16 MED ORDER — SODIUM CHLORIDE 0.9% FLUSH
10.0000 mL | Freq: Once | INTRAVENOUS | Status: AC
  Administered 2021-05-16: 10 mL via INTRAVENOUS

## 2021-05-16 MED ORDER — HEPARIN SOD (PORK) LOCK FLUSH 100 UNIT/ML IV SOLN
500.0000 [IU] | Freq: Once | INTRAVENOUS | Status: AC
  Administered 2021-05-16: 500 [IU] via INTRAVENOUS

## 2021-05-16 NOTE — Progress Notes (Signed)
Patients port flushed without difficulty.  Good blood return noted with no bruising or swelling noted at site.  Band aid applied.  VSS with discharge and left in satisfactory condition with no s/s of distress noted.   

## 2021-05-16 NOTE — Patient Instructions (Addendum)
[place] at [facility] Discharge Instructions   You were seen and examined today by Dr. Delton Coombes.  We will check your lab work today.  We will call if anything needs to be addressed.   You can use Cepocol throat lozenges for sore throat.  Return as scheduled for lab work, office visit, and treatment.    Thank you for choosing [place] at [facility] to provide your oncology and hematology care.  To afford each patient quality time with our provider, please arrive at least 15 minutes before your scheduled appointment time.   If you have a lab appointment with the [facility] please come in thru the Main Entrance and check in at the main information desk.  You need to re-schedule your appointment should you arrive 10 or more minutes late.  We strive to give you quality time with our providers, and arriving late affects you and other patients whose appointments are after yours.  Also, if you no show three or more times for appointments you may be dismissed from the clinic at the providers discretion.     Again, thank you for choosing [facility].  Our hope is that these requests will decrease the amount of time that you wait before being seen by our physicians.       _____________________________________________________________  Should you have questions after your visit to [facility], please contact our office at [phone] and follow the prompts.  Our office hours are 8:00 a.m. and 4:30 p.m. Monday - Friday.  Please note that voicemails left after 4:00 p.m. may not be returned until the following business day.  We are closed weekends and major holidays.  You do have access to a nurse 24-7, just call the main number to the clinic [phone] and do not press any options, hold on the line and a nurse will answer the phone.    For prescription refill requests, have your pharmacy contact our office and allow 72  hours.    Due to Covid, you will need to wear a mask upon entering the hospital. If you do not have a mask, a mask will be given to you at the Main Entrance upon arrival. For doctor visits, patients may have 1 support person age 65 or older with them. For treatment visits, patients can not have anyone with them due to social distancing guidelines and our immunocompromised population.

## 2021-05-27 ENCOUNTER — Encounter (HOSPITAL_COMMUNITY): Payer: Self-pay | Admitting: *Deleted

## 2021-05-27 ENCOUNTER — Telehealth: Payer: Self-pay | Admitting: *Deleted

## 2021-05-27 ENCOUNTER — Emergency Department (HOSPITAL_COMMUNITY): Payer: Medicare Other

## 2021-05-27 ENCOUNTER — Inpatient Hospital Stay (HOSPITAL_COMMUNITY)
Admission: EM | Admit: 2021-05-27 | Discharge: 2021-05-29 | DRG: 281 | Disposition: A | Discharge status: Home / Self Care | Payer: Medicare Other | Attending: Internal Medicine | Admitting: Internal Medicine

## 2021-05-27 DIAGNOSIS — T451X5A Adverse effect of antineoplastic and immunosuppressive drugs, initial encounter: Secondary | ICD-10-CM | POA: Diagnosis present

## 2021-05-27 DIAGNOSIS — Z85820 Personal history of malignant melanoma of skin: Secondary | ICD-10-CM | POA: Diagnosis not present

## 2021-05-27 DIAGNOSIS — E669 Obesity, unspecified: Secondary | ICD-10-CM | POA: Diagnosis present

## 2021-05-27 DIAGNOSIS — F419 Anxiety disorder, unspecified: Secondary | ICD-10-CM | POA: Diagnosis not present

## 2021-05-27 DIAGNOSIS — I514 Myocarditis, unspecified: Secondary | ICD-10-CM | POA: Diagnosis present

## 2021-05-27 DIAGNOSIS — Z95828 Presence of other vascular implants and grafts: Secondary | ICD-10-CM

## 2021-05-27 DIAGNOSIS — I517 Cardiomegaly: Secondary | ICD-10-CM | POA: Diagnosis not present

## 2021-05-27 DIAGNOSIS — G319 Degenerative disease of nervous system, unspecified: Secondary | ICD-10-CM | POA: Diagnosis not present

## 2021-05-27 DIAGNOSIS — Z6833 Body mass index (BMI) 33.0-33.9, adult: Secondary | ICD-10-CM

## 2021-05-27 DIAGNOSIS — Z7983 Long term (current) use of bisphosphonates: Secondary | ICD-10-CM | POA: Diagnosis not present

## 2021-05-27 DIAGNOSIS — I10 Essential (primary) hypertension: Secondary | ICD-10-CM | POA: Diagnosis not present

## 2021-05-27 DIAGNOSIS — E876 Hypokalemia: Secondary | ICD-10-CM | POA: Diagnosis not present

## 2021-05-27 DIAGNOSIS — K8020 Calculus of gallbladder without cholecystitis without obstruction: Secondary | ICD-10-CM | POA: Diagnosis present

## 2021-05-27 DIAGNOSIS — C4371 Malignant melanoma of right lower limb, including hip: Secondary | ICD-10-CM | POA: Diagnosis not present

## 2021-05-27 DIAGNOSIS — Z79899 Other long term (current) drug therapy: Secondary | ICD-10-CM | POA: Diagnosis not present

## 2021-05-27 DIAGNOSIS — R079 Chest pain, unspecified: Secondary | ICD-10-CM | POA: Diagnosis not present

## 2021-05-27 DIAGNOSIS — Z20822 Contact with and (suspected) exposure to covid-19: Secondary | ICD-10-CM | POA: Diagnosis present

## 2021-05-27 DIAGNOSIS — I214 Non-ST elevation (NSTEMI) myocardial infarction: Secondary | ICD-10-CM | POA: Diagnosis not present

## 2021-05-27 DIAGNOSIS — Q441 Other congenital malformations of gallbladder: Secondary | ICD-10-CM

## 2021-05-27 DIAGNOSIS — E871 Hypo-osmolality and hyponatremia: Secondary | ICD-10-CM | POA: Diagnosis present

## 2021-05-27 DIAGNOSIS — M1990 Unspecified osteoarthritis, unspecified site: Secondary | ICD-10-CM | POA: Diagnosis not present

## 2021-05-27 DIAGNOSIS — I6782 Cerebral ischemia: Secondary | ICD-10-CM | POA: Diagnosis not present

## 2021-05-27 LAB — RESP PANEL BY RT-PCR (FLU A&B, COVID) ARPGX2
Influenza A by PCR: NEGATIVE
Influenza B by PCR: NEGATIVE
SARS Coronavirus 2 by RT PCR: NEGATIVE

## 2021-05-27 LAB — CBC
HCT: 37.8 % (ref 36.0–46.0)
Hemoglobin: 13.1 g/dL (ref 12.0–15.0)
MCH: 31.5 pg (ref 26.0–34.0)
MCHC: 34.7 g/dL (ref 30.0–36.0)
MCV: 90.9 fL (ref 80.0–100.0)
Platelets: 213 10*3/uL (ref 150–400)
RBC: 4.16 MIL/uL (ref 3.87–5.11)
RDW: 11.4 % — ABNORMAL LOW (ref 11.5–15.5)
WBC: 5.1 10*3/uL (ref 4.0–10.5)
nRBC: 0 % (ref 0.0–0.2)

## 2021-05-27 LAB — BASIC METABOLIC PANEL
Anion gap: 10 (ref 5–15)
BUN: 13 mg/dL (ref 8–23)
CO2: 28 mmol/L (ref 22–32)
Calcium: 9.1 mg/dL (ref 8.9–10.3)
Chloride: 93 mmol/L — ABNORMAL LOW (ref 98–111)
Creatinine, Ser: 0.86 mg/dL (ref 0.44–1.00)
GFR, Estimated: >60 mL/min (ref >60)
Glucose, Bld: 147 mg/dL — ABNORMAL HIGH (ref 70–99)
Potassium: 3.3 mmol/L — ABNORMAL LOW (ref 3.5–5.1)
Sodium: 131 mmol/L — ABNORMAL LOW (ref 135–145)

## 2021-05-27 LAB — TROPONIN I (HIGH SENSITIVITY)
Troponin I (High Sensitivity): 254 ng/L (ref <18)
Troponin I (High Sensitivity): 998 ng/L (ref <18)

## 2021-05-27 MED ORDER — SODIUM CHLORIDE 0.9% FLUSH
3.0000 mL | Freq: Two times a day (BID) | INTRAVENOUS | Status: DC
  Administered 2021-05-27 – 2021-05-28 (×3): 3 mL via INTRAVENOUS

## 2021-05-27 MED ORDER — ATORVASTATIN CALCIUM 40 MG PO TABS
40.0000 mg | ORAL_TABLET | Freq: Every day | ORAL | Status: DC
  Administered 2021-05-27 – 2021-05-29 (×3): 40 mg via ORAL
  Filled 2021-05-27 (×3): qty 1

## 2021-05-27 MED ORDER — METOPROLOL TARTRATE 25 MG PO TABS
25.0000 mg | ORAL_TABLET | Freq: Two times a day (BID) | ORAL | Status: DC
  Administered 2021-05-27 – 2021-05-29 (×3): 25 mg via ORAL
  Filled 2021-05-27 (×3): qty 1

## 2021-05-27 MED ORDER — NITROGLYCERIN 2 % TD OINT
1.0000 [in_us] | TOPICAL_OINTMENT | Freq: Once | TRANSDERMAL | Status: AC
  Administered 2021-05-27: 1 [in_us] via TOPICAL
  Filled 2021-05-27: qty 1

## 2021-05-27 MED ORDER — LACTATED RINGERS IV SOLN: INTRAVENOUS | Status: DC

## 2021-05-27 MED ORDER — MORPHINE SULFATE (PF) 2 MG/ML IV SOLN
2.0000 mg | INTRAVENOUS | Status: DC | PRN
  Administered 2021-05-27: 2 mg via INTRAVENOUS
  Filled 2021-05-27: qty 1

## 2021-05-27 MED ORDER — NITROGLYCERIN 0.4 MG SL SUBL: 0.4000 mg | SUBLINGUAL_TABLET | SUBLINGUAL | Status: DC | PRN

## 2021-05-27 MED ORDER — HEPARIN (PORCINE) 25000 UT/250ML-% IV SOLN
1000.0000 [IU]/h | INTRAVENOUS | Status: DC
  Administered 2021-05-27: 19:00:00 850 [IU]/h via INTRAVENOUS
  Filled 2021-05-27: qty 250

## 2021-05-27 MED ORDER — HEPARIN BOLUS VIA INFUSION
3000.0000 [IU] | Freq: Once | INTRAVENOUS | Status: AC
  Administered 2021-05-27: 3000 [IU] via INTRAVENOUS

## 2021-05-27 MED ORDER — IOHEXOL 350 MG/ML SOLN
100.0000 mL | Freq: Once | INTRAVENOUS | Status: AC | PRN
  Administered 2021-05-27: 80 mL via INTRAVENOUS

## 2021-05-27 MED ORDER — ASPIRIN EC 81 MG PO TBEC
81.0000 mg | DELAYED_RELEASE_TABLET | Freq: Every day | ORAL | Status: DC
  Administered 2021-05-28 – 2021-05-29 (×2): 81 mg via ORAL
  Filled 2021-05-27 (×2): qty 1

## 2021-05-27 MED ORDER — SODIUM CHLORIDE 0.9 % IV SOLN
250.0000 mL | INTRAVENOUS | Status: AC
  Administered 2021-05-27: 250 mL via INTRAVENOUS

## 2021-05-27 MED ORDER — MORPHINE SULFATE (PF) 4 MG/ML IV SOLN
2.0000 mg | Freq: Once | INTRAVENOUS | Status: AC
  Administered 2021-05-27: 2 mg via INTRAVENOUS
  Filled 2021-05-27: qty 1

## 2021-05-27 MED ORDER — ASPIRIN 81 MG PO CHEW
324.0000 mg | CHEWABLE_TABLET | Freq: Once | ORAL | Status: AC
  Administered 2021-05-27: 324 mg via ORAL
  Filled 2021-05-27: qty 4

## 2021-05-27 MED ORDER — ACETAMINOPHEN 325 MG PO TABS: 650.0000 mg | ORAL_TABLET | ORAL | Status: DC | PRN

## 2021-05-27 MED ORDER — ONDANSETRON HCL 4 MG/2ML IJ SOLN: 4.0000 mg | Freq: Four times a day (QID) | INTRAMUSCULAR | Status: DC | PRN

## 2021-05-27 MED ORDER — SODIUM CHLORIDE 0.9% FLUSH: 3.0000 mL | INTRAVENOUS | Status: DC | PRN

## 2021-05-27 MED ORDER — ALPRAZOLAM 0.5 MG PO TABS
0.5000 mg | ORAL_TABLET | Freq: Three times a day (TID) | ORAL | Status: DC | PRN
  Administered 2021-05-28 (×2): 0.5 mg via ORAL
  Filled 2021-05-27 (×2): qty 1

## 2021-05-27 NOTE — H&P (Addendum)
Elizabeth Norman is an 81 y.o. female.   has a past medical history of Arthritis, Hypertension, and Port-A-Cath in place (04/30/2021).  Chief Complaint:  Chief Complaint  Patient presents with   Chest Pain    HPI: Presented to ED with chest pain worsening over the past day.  Initially thought was due to recent chemotherapy treatments as she was having back pain and bilateral arm pain, concerned about muscle aches.  Denies shortness of breath/orthopnea, dizziness, syncope/presyncope, lower extremity edema.  Undergoing chemotherapy for malignant melanoma, first infusion was 05/09/2021.   HOSPITAL COURSE: ED COURSE / DATE OF ADMISSION 05/27/21 - (+)troponins trending up, EKG no STEMI, CTA Chest showed no PE. ED administered ASA, nitroglycerin, morphine, heparin bolus and started heparin gtt. ED physician spoke to cardiology, who recommend admission to [facility].  At time of my interview/exam, patient is overall comfortable but she continues to have some mild chest pain though reports improvement in chest pain from initial presentation.  Son is at bedside.   ASSESSMENT & PLAN:  NSTEMI (non-ST elevated myocardial infarction) [facility]) Cardiology following. Pending transfer to [facility]. Continue heparin. Adding prn morphine since she's still having mild chest pain, repeat EKG if needed (worsening chest pain, unstable VS, other s/s decompensation). Held home antihypertensives, changed to beta blocker only for now, added statin, continue ASA.   Malignant melanoma of lower leg, right (HCC) Following w/ HemOnc  Hyponatremia Slightly lower than previous recent measurements, mild, likely SIADH, added serum Osm, trend AM labs  Hypokalemia Stable from previous recent measurements, mild, d/c'ed chlorthalidone, follow AM BMP  Anxiety Continue home benzo  VTE prophylaxis: Currently on full dose heparin CODE STATUS: Full code, patient is encouraged to discuss advanced directives with her oncology team,  patient was advised that she does have some risk of decompensation and she was advised on risks/benefits of CPR, she would like all measures taken and would like medical team to have a discussion with sons if she is unable to make medical decisions.  Given comorbidities, and cancer diagnosis, would strongly consider palliative care discussion to help complete advanced directive/MOST. Dispo: Hopefully can go home with home health, possibly SNF/cardiac rehab    Past Medical History:  Diagnosis Date   Arthritis    Hypertension    Port-A-Cath in place 04/30/2021    Past Surgical History:  Procedure Laterality Date   CATARACT EXTRACTION W/PHACO Right 10/03/2014   Procedure: CATARACT EXTRACTION PHACO AND INTRAOCULAR LENS PLACEMENT ([place]);  Surgeon: [name], MD;  Location: [facility];  Service: Ophthalmology;  Laterality: Right;  CDE:10.09   CATARACT EXTRACTION W/PHACO Left 10/10/2014   Procedure: CATARACT EXTRACTION PHACO AND INTRAOCULAR LENS PLACEMENT ([facility]);  Surgeon: [name], MD;  Location: [facility];  Service: Ophthalmology;  Laterality: Left;  CDE:8.69   CORNEAL 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   CORNEAL TRANSPLANT Left 2020   DENTAL RESTORATION/EXTRACTION WITH X-RAY     IR IMAGING GUIDED PORT INSERTION  04/05/2021   MELANOMA EXCISION Right 02/12/2021   Procedure: WIDE LOCAL EXCISION RIGHT LOWER LEG MELANOMA , ADVANCEMENT FLAP CLOSURE DEFECT;  Surgeon: [name], MD;  Location: [facility];  Service: General;  Laterality: Right;   SENTINEL NODE BIOPSY Right 02/12/2021   Procedure: SENTINEL NODE BIOPSY;  Surgeon: [name], MD;  Location: [place];  Service: General;  Laterality: Right;    No family history on file. Social History:  reports that she has never smoked. She has never used smokeless tobacco. She reports that she does not drink alcohol  and does not use drugs.  Allergies:  Allergies  Allergen Reactions   Tape     Pulls skin, please use paper tape    (Not in a hospital  admission)   Results for orders placed or performed during the hospital encounter of 05/27/21 (from the past 48 hour(s))  Basic metabolic panel     Status: Abnormal   Collection Time: 05/27/21  5:04 PM  Result Value Ref Range   Sodium 131 (L) 135 - 145 mmol/L   Potassium 3.3 (L) 3.5 - 5.1 mmol/L   Chloride 93 (L) 98 - 111 mmol/L   CO2 28 22 - 32 mmol/L   Glucose, Bld 147 (H) 70 - 99 mg/dL    Comment: Glucose reference range applies only to samples taken after fasting for at least 8 hours.   BUN 13 8 - 23 mg/dL   Creatinine, Ser 0.86 0.44 - 1.00 mg/dL   Calcium 9.1 8.9 - 10.3 mg/dL   GFR, Estimated >60 >60 mL/min    Comment: (NOTE) Calculated using the CKD-EPI Creatinine Equation (2021)    Anion gap 10 5 - 15    Comment: Performed at [facility], [address], [place], [place] [address]  CBC     Status: Abnormal   Collection Time: 05/27/21  5:04 PM  Result Value Ref Range   WBC 5.1 4.0 - 10.5 K/uL   RBC 4.16 3.87 - 5.11 MIL/uL   Hemoglobin 13.1 12.0 - 15.0 g/dL   HCT 37.8 36.0 - 46.0 %   MCV 90.9 80.0 - 100.0 fL   MCH 31.5 26.0 - 34.0 pg   MCHC 34.7 30.0 - 36.0 g/dL   RDW 11.4 (L) 11.5 - 15.5 %   Platelets 213 150 - 400 K/uL   nRBC 0.0 0.0 - 0.2 %    Comment: Performed at [facility], [address], [place], [place] [address]  Troponin I (High Sensitivity)     Status: Abnormal   Collection Time: 05/27/21  5:04 PM  Result Value Ref Range   Troponin I (High Sensitivity) 254 (HH) <18 ng/L    Comment: CRITICAL RESULT CALLED TO, READ BACK BY AND VERIFIED WITH: LONG,J ON 05/27/21 AT 1815 BY LOY,C (NOTE) Elevated high sensitivity troponin I (hsTnI) values and significant  changes across serial measurements may suggest ACS but many other  chronic and acute conditions are known to elevate hsTnI results.  Refer to the Links section for chest pain algorithms and additional  guidance. Performed at [facility], [address] [place], [place] [address]   Troponin I (High  Sensitivity)     Status: Abnormal   Collection Time: 05/27/21  7:08 PM  Result Value Ref Range   Troponin I (High Sensitivity) 998 (HH) <18 ng/L    Comment: DELTA CHECK NOTED CRITICAL RESULT CALLED TO, READ BACK BY AND VERIFIED WITH: BRAME,M ON 05/27/21 AT  2010 BY LOY,C (NOTE) Elevated high sensitivity troponin I (hsTnI) values and significant  changes across serial measurements may suggest ACS but many other  chronic and acute conditions are known to elevate hsTnI results.  Refer to the Links section for chest pain algorithms and additional  guidance. Performed at [facility], [address], [place], [place] [address]    DG Chest 2 View  Result Date: 05/27/2021 CLINICAL DATA:  Chest pain EXAM: CHEST - 2 VIEW COMPARISON:  None. FINDINGS: The heart size and mediastinal contours are within normal limits. Right central venous line is identified with distal tip in  the superior vena cava. Both lungs are clear. Degenerative joint changes of the spine noted. IMPRESSION: No active cardiopulmonary disease. Electronically Signed   By: Abelardo Diesel M.D.   On: 05/27/2021 17:22   CT Head Wo Contrast  Result Date: 05/27/2021 CLINICAL DATA:  History of melanoma EXAM: CT HEAD WITHOUT CONTRAST TECHNIQUE: Contiguous axial images were obtained from the base of the skull through the vertex without intravenous contrast. RADIATION DOSE REDUCTION: This exam was performed according to the departmental dose-optimization program which includes automated exposure control, adjustment of the mA and/or kV according to patient size and/or use of iterative reconstruction technique. COMPARISON:  None. FINDINGS: Brain: No acute territorial infarction, hemorrhage or intracranial mass. Mild atrophy. Minimal chronic small vessel ischemic changes of the white matter. Ventricles are nonenlarged Vascular: No hyperdense vessels.  No unexpected calcification Skull: Normal. Negative for fracture or focal lesion. Sinuses/Orbits: No  acute finding. Other: None IMPRESSION: 1. No CT evidence for acute intracranial abnormality. 2. Mild atrophy Electronically Signed   By: Donavan Foil M.D.   On: 05/27/2021 20:10   CT Angio Chest PE W and/or Wo Contrast  Result Date: 05/27/2021 CLINICAL DATA:  Chest pain EXAM: CT ANGIOGRAPHY CHEST WITH CONTRAST TECHNIQUE: Multidetector CT imaging of the chest was performed using the standard protocol during bolus administration of intravenous contrast. Multiplanar CT image reconstructions and MIPs were obtained to evaluate the vascular anatomy. RADIATION DOSE REDUCTION: This exam was performed according to the departmental dose-optimization program which includes automated exposure control, adjustment of the mA and/or kV according to patient size and/or use of iterative reconstruction technique. CONTRAST:  61mL OMNIPAQUE IOHEXOL 350 MG/ML SOLN COMPARISON:  Chest x-ray 05/27/2021 FINDINGS: Cardiovascular: Satisfactory opacification of the pulmonary arteries to the segmental level. No evidence of pulmonary embolism. Mild aortic atherosclerosis. No aneurysm. Mild coronary vascular calcification. Mild cardiomegaly. No pericardial effusion Mediastinum/Nodes: Midline trachea. Homogeneously enlarged thyroid. No suspicious lymph nodes. Esophagus within normal limits. Lungs/Pleura: Lungs are clear. No pleural effusion or pneumothorax. Upper Abdomen: Hyperdensity within the gallbladder with appearance not typical for stones. Musculoskeletal: No acute osseous abnormality Review of the MIP images confirms the above findings. IMPRESSION: 1. Negative for acute pulmonary embolus.  Clear lung fields. 2. Hyperdensity within the gallbladder with slightly atypical appearance for stone disease, question tumefactive sludge or mass. Suggest correlation with right upper quadrant ultrasound. Aortic Atherosclerosis (ICD10-I70.0). Electronically Signed   By: Donavan Foil M.D.   On: 05/27/2021 20:08    Review of Systems   Constitutional:  Negative for activity change, diaphoresis and fatigue.  HENT:  Negative for trouble swallowing.   Respiratory:  Positive for chest tightness. Negative for apnea, cough and shortness of breath.   Cardiovascular:  Positive for chest pain. Negative for palpitations and leg swelling.  Gastrointestinal:  Negative for abdominal pain.  Genitourinary:  Negative for difficulty urinating and frequency.  Musculoskeletal:  Positive for arthralgias and back pain. Negative for gait problem.  Neurological:  Negative for dizziness, speech difficulty and light-headedness.  Psychiatric/Behavioral:  The patient is nervous/anxious.    Blood pressure 139/64, pulse 87, temperature (!) 97.5 F (36.4 C), temperature source Oral, resp. rate 18, height 5\' 2"  (1.575 m), weight 83.8 kg, SpO2 98 %. Physical Exam Constitutional:      Appearance: She is well-developed.  HENT:     Head: Normocephalic and atraumatic.  Eyes:     Extraocular Movements: Extraocular movements intact.  Cardiovascular:     Rate and Rhythm: Normal rate and regular rhythm.  Heart sounds: No murmur heard.   No friction rub. No gallop.  Pulmonary:     Effort: Pulmonary effort is normal. No accessory muscle usage.     Breath sounds: Normal breath sounds.  Chest:     Chest wall: No tenderness.  Abdominal:     Palpations: Abdomen is soft.  Musculoskeletal:     Cervical back: Normal range of motion and neck supple.     Right lower leg: No edema.     Left lower leg: No edema.  Skin:    General: Skin is warm and dry.  Neurological:     General: No focal deficit present.     Mental Status: She is alert and oriented to person, place, and time.  Psychiatric:        Mood and Affect: Mood normal.        Behavior: Behavior normal. Behavior is not agitated.     Emeterio Reeve, DO 05/27/2021, 8:51 PM

## 2021-05-27 NOTE — ED Notes (Signed)
Pt complaining of pain in the center of chest, gave morphine for it

## 2021-05-27 NOTE — ED Provider Notes (Signed)
[facility] Provider Note   CSN: [id number] Arrival date & time: 05/27/21  1631     History  Chief Complaint  Patient presents with   Chest Pain    Elizabeth Norman is a 81 y.o. female.   Chest Pain Associated symptoms: back pain   Associated symptoms: no abdominal pain, no cough, no diaphoresis, no dizziness, no fatigue, no fever, no headache, no nausea, no numbness, no palpitations, no shortness of breath, no vomiting and no weakness   Patient presenting for chest pain since last night.  She has a recent diagnosis of malignant melanoma.  She was initiated on immunotherapy 2.5 weeks ago.  She states that she has felt unwell since that time.  She has had areas of diffuse pain over the past 2 weeks.  Last night, she experienced a tightness in her chest and pain in her bilateral proximal arms, upper back, and neck.  The symptoms have persisted today.  She has not taken any aspirin.  She is not on any blood thinning medications.  In addition to her melanoma, she does have hypertension but she denies any other significant medical history.  She has no history of ACS.  She does not smoke and does not have any known family history of early ACS.    Home Medications Prior to Admission medications   Medication Sig Start Date End Date Taking? Authorizing Provider  alendronate (FOSAMAX) 70 MG tablet Take 70 mg by mouth once a week. 06/24/14  Yes [provider]  ALPRAZolam Duanne Moron) 0.5 MG tablet Take 0.5 mg by mouth 3 (three) times daily. 08/22/14  Yes [provider]  atenolol-chlorthalidone (TENORETIC) 50-25 MG tablet Take 1 tablet by mouth in the morning. 09/11/20  Yes [provider]  calcium carbonate (OS-CAL - DOSED IN MG OF ELEMENTAL CALCIUM) 1250 (500 CA) MG tablet Take 1 tablet by mouth 3 (three) times a week.   Yes [provider]  cholecalciferol (VITAMIN D) 1000 UNITS tablet Take 1,000 Units by mouth 3 (three) times a week.   Yes  [provider]  Cyanocobalamin (VITAMIN B-12 PO) Take 1 tablet by mouth 3 (three) times a week.   Yes [provider]  lidocaine-prilocaine (EMLA) cream Apply a small amount to port a cath site and cover with plastic wrap 1 hour prior to port flush appointments 04/30/21  Yes Derek Jack, MD  Nivolumab-Relatlimab-rmbw (OPDUALAG) 240-80 MG/20ML SOLN Inject into the vein every 28 (twenty-eight) days. 05/09/21  Yes [provider]  prednisoLONE acetate (PRED FORTE) 1 % ophthalmic suspension Place 1 drop into both eyes See admin instructions. Instill one drop into the right eye twice daily and one drop into the left eye once daily. 12/18/20  Yes [provider]  diphenhydramine-acetaminophen (TYLENOL PM) 25-500 MG TABS tablet Take 1 tablet by mouth at bedtime as needed (sleep). Patient not taking: Reported on 05/16/2021    [provider]      Allergies    Tape    Review of Systems   Review of Systems  Constitutional:  Negative for activity change, appetite change, chills, diaphoresis, fatigue and fever.  HENT:  Negative for ear pain and sore throat.   Eyes:  Negative for pain and visual disturbance.  Respiratory:  Positive for chest tightness. Negative for cough, shortness of breath, wheezing and stridor.   Cardiovascular:  Positive for chest pain. Negative for palpitations.  Gastrointestinal:  Negative for abdominal pain, diarrhea, nausea and vomiting.  Genitourinary:  Negative  for dysuria, flank pain and hematuria.  Musculoskeletal:  Positive for arthralgias, back pain, myalgias and neck pain. Negative for gait problem and joint swelling.  Skin:  Negative for color change and rash.  Neurological:  Negative for dizziness, seizures, syncope, weakness, light-headedness, numbness and headaches.  Hematological:  Does not bruise/bleed easily.  Psychiatric/Behavioral:  Negative for confusion and decreased concentration.   All other systems reviewed  and are negative.  Physical Exam Updated Vital Signs BP (!) 129/49 (BP Location: Left Arm)    Pulse 94    Temp 98.4 F (36.9 C) (Oral)    Resp (!) 26    Ht 5\' 2"  (1.575 m)    Wt 83.8 kg    SpO2 97%    BMI 33.79 kg/m  Physical Exam Vitals and nursing note reviewed.  Constitutional:      General: She is not in acute distress.    Appearance: She is well-developed and normal weight. She is not ill-appearing, toxic-appearing or diaphoretic.  HENT:     Head: Normocephalic and atraumatic.  Eyes:     Conjunctiva/sclera: Conjunctivae normal.  Cardiovascular:     Rate and Rhythm: Normal rate and regular rhythm.     Heart sounds: Normal heart sounds. No murmur heard. Pulmonary:     Effort: Pulmonary effort is normal. No respiratory distress.     Breath sounds: Normal breath sounds. No decreased breath sounds, wheezing, rhonchi or rales.  Chest:     Chest wall: No tenderness or crepitus.  Abdominal:     Palpations: Abdomen is soft.     Tenderness: There is no abdominal tenderness.  Musculoskeletal:        General: No swelling.     Cervical back: Normal range of motion and neck supple.     Right lower leg: No edema.     Left lower leg: No edema.  Skin:    General: Skin is warm and dry.     Capillary Refill: Capillary refill takes less than 2 seconds.     Coloration: Skin is not cyanotic or pale.  Neurological:     General: No focal deficit present.     Mental Status: She is alert and oriented to person, place, and time.     Cranial Nerves: No cranial nerve deficit.     Motor: No weakness.  Psychiatric:        Mood and Affect: Mood normal.        Behavior: Behavior normal.    ED Results / Procedures / Treatments   Labs (all labs ordered are listed, but only abnormal results are displayed) Labs Reviewed  BASIC METABOLIC PANEL - Abnormal; Notable for the following components:      Result Value   Sodium 131 (*)    Potassium 3.3 (*)    Chloride 93 (*)    Glucose, Bld 147 (*)     All other components within normal limits  CBC - Abnormal; Notable for the following components:   RDW 11.4 (*)    All other components within normal limits  HEPARIN LEVEL (UNFRACTIONATED) - Abnormal; Notable for the following components:   Heparin Unfractionated <0.10 (*)    All other components within normal limits  CBC - Abnormal; Notable for the following components:   RBC 3.60 (*)    Hemoglobin 11.1 (*)    HCT 32.7 (*)    All other components within normal limits  BASIC METABOLIC PANEL - Abnormal; Notable for the following components:   Sodium 130 (*)  Chloride 94 (*)    Glucose, Bld 116 (*)    Calcium 8.4 (*)    Anion gap 4 (*)    All other components within normal limits  LIPID PANEL - Abnormal; Notable for the following components:   HDL 31 (*)    All other components within normal limits  CBC - Abnormal; Notable for the following components:   RBC 3.79 (*)    Hemoglobin 11.6 (*)    HCT 32.9 (*)    RDW 11.3 (*)    All other components within normal limits  TROPONIN I (HIGH SENSITIVITY) - Abnormal; Notable for the following components:   Troponin I (High Sensitivity) 254 (*)    All other components within normal limits  TROPONIN I (HIGH SENSITIVITY) - Abnormal; Notable for the following components:   Troponin I (High Sensitivity) 998 (*)    All other components within normal limits  TROPONIN I (HIGH SENSITIVITY) - Abnormal; Notable for the following components:   Troponin I (High Sensitivity) 12,617 (*)    All other components within normal limits  RESP PANEL BY RT-PCR (FLU A&B, COVID) [id number]  OSMOLALITY  HEPARIN LEVEL (UNFRACTIONATED)  CREATININE, SERUM  CBC  COMPREHENSIVE METABOLIC PANEL    EKG EKG Interpretation  Date/Time:  Monday May 27 2021 16:53:35 EST Ventricular Rate:  81 PR Interval:  158 QRS Duration: 92 QT Interval:  418 QTC Calculation: 485 R Axis:   9 Text Interpretation: Normal sinus rhythm Incomplete right bundle branch block Cannot  rule out Anterior infarct , age undetermined Abnormal ECG When compared with ECG of 01-Feb-2021 14:43, QT has lengthened Confirmed by Godfrey Pick [phone]) on 05/27/2021 6:18:28 PM  Radiology DG Chest 2 View  Result Date: 05/27/2021 CLINICAL DATA:  Chest pain EXAM: CHEST - 2 VIEW COMPARISON:  None. FINDINGS: The heart size and mediastinal contours are within normal limits. Right central venous line is identified with distal tip in the superior vena cava. Both lungs are clear. Degenerative joint changes of the spine noted. IMPRESSION: No active cardiopulmonary disease. Electronically Signed   By: Abelardo Diesel M.D.   On: 05/27/2021 17:22   CT Head Wo Contrast  Result Date: 05/27/2021 CLINICAL DATA:  History of melanoma EXAM: CT HEAD WITHOUT CONTRAST TECHNIQUE: Contiguous axial images were obtained from the base of the skull through the vertex without intravenous contrast. RADIATION DOSE REDUCTION: This exam was performed according to the departmental dose-optimization program which includes automated exposure control, adjustment of the mA and/or kV according to patient size and/or use of iterative reconstruction technique. COMPARISON:  None. FINDINGS: Brain: No acute territorial infarction, hemorrhage or intracranial mass. Mild atrophy. Minimal chronic small vessel ischemic changes of the white matter. Ventricles are nonenlarged Vascular: No hyperdense vessels.  No unexpected calcification Skull: Normal. Negative for fracture or focal lesion. Sinuses/Orbits: No acute finding. Other: None IMPRESSION: 1. No CT evidence for acute intracranial abnormality. 2. Mild atrophy Electronically Signed   By: Donavan Foil M.D.   On: 05/27/2021 20:10   CT Angio Chest PE W and/or Wo Contrast  Result Date: 05/27/2021 CLINICAL DATA:  Chest pain EXAM: CT ANGIOGRAPHY CHEST WITH CONTRAST TECHNIQUE: Multidetector CT imaging of the chest was performed using the standard protocol during bolus administration of intravenous contrast.  Multiplanar CT image reconstructions and MIPs were obtained to evaluate the vascular anatomy. RADIATION DOSE REDUCTION: This exam was performed according to the departmental dose-optimization program which includes automated exposure control, adjustment of the mA and/or kV according to patient size and/or  use of iterative reconstruction technique. CONTRAST:  54mL OMNIPAQUE IOHEXOL 350 MG/ML SOLN COMPARISON:  Chest x-ray 05/27/2021 FINDINGS: Cardiovascular: Satisfactory opacification of the pulmonary arteries to the segmental level. No evidence of pulmonary embolism. Mild aortic atherosclerosis. No aneurysm. Mild coronary vascular calcification. Mild cardiomegaly. No pericardial effusion Mediastinum/Nodes: Midline trachea. Homogeneously enlarged thyroid. No suspicious lymph nodes. Esophagus within normal limits. Lungs/Pleura: Lungs are clear. No pleural effusion or pneumothorax. Upper Abdomen: Hyperdensity within the gallbladder with appearance not typical for stones. Musculoskeletal: No acute osseous abnormality Review of the MIP images confirms the above findings. IMPRESSION: 1. Negative for acute pulmonary embolus.  Clear lung fields. 2. Hyperdensity within the gallbladder with slightly atypical appearance for stone disease, question tumefactive sludge or mass. Suggest correlation with right upper quadrant ultrasound. Aortic Atherosclerosis (ICD10-I70.0). Electronically Signed   By: Donavan Foil M.D.   On: 05/27/2021 20:08   CARDIAC CATHETERIZATION  Result Date: 05/28/2021 Images from the original result were not included. LM: Normal LAD: Mid 30% disease         Multiple diagonal branches supplying lateral wall         Diag 3, which actually comes up quite high off the LAD, with 50% ostial stenosis Lcx: No OM branches coming off proximal part of the vessel, possibly due to multiple high diagonals \        No definite evidence of an occluded proximal OM        Minimal luminal irregularities in prox LCx RCA:  Dominant vessel. No significant disease LVEDP, LVEF normal ACS event possibly due to mild plaque erosion. ?Hypercoagulability in the setting of melanoma and immunotherapy. Recommend DAPT with Aspirin and plavix. This can be interrupted even before one year if surgical biopsy or procedure is required for melanoma. Nigel Mormon, MD Pager: [phone] Office: [phone]    Procedures Procedures    Medications Ordered in ED Medications  lactated ringers infusion (0 mLs Intravenous Stopped 05/28/21 1224)  aspirin EC tablet 81 mg ( Oral MAR Unhold 05/28/21 1911)  nitroGLYCERIN (NITROSTAT) SL tablet 0.4 mg ( Sublingual MAR Unhold 05/28/21 1911)  sodium chloride flush (NS) 0.9 % injection 3 mL (3 mLs Intravenous Given 05/28/21 2128)  sodium chloride flush (NS) 0.9 % injection 3 mL ( Intravenous MAR Unhold 05/28/21 1911)  0.9 %  sodium chloride infusion (0 mLs Intravenous Stopped 05/28/21 0100)  metoprolol tartrate (LOPRESSOR) tablet 25 mg (25 mg Oral Given 05/28/21 2109)  atorvastatin (LIPITOR) tablet 40 mg ( Oral MAR Unhold 05/28/21 1911)  ALPRAZolam (XANAX) tablet 0.5 mg (0.5 mg Oral Given 05/28/21 2030)  morphine 2 MG/ML injection 2 mg ( Intravenous MAR Unhold 05/28/21 1911)  nitroGLYCERIN 50 mg in dextrose 5 % 250 mL (0.2 mg/mL) infusion ( Intravenous MAR Unhold 05/28/21 1911)  sodium chloride flush (NS) 0.9 % injection 3 mL (3 mLs Intravenous Given 05/28/21 2127)  sodium chloride flush (NS) 0.9 % injection 3 mL (has no administration in time range)  0.9 %  sodium chloride infusion (has no administration in time range)  labetalol (NORMODYNE) injection 10 mg (has no administration in time range)  hydrALAZINE (APRESOLINE) injection 10 mg (has no administration in time range)  acetaminophen (TYLENOL) tablet 650 mg (650 mg Oral Given 05/29/21 0203)  ondansetron (ZOFRAN) injection 4 mg (has no administration in time range)  0.9 %  sodium chloride infusion (0 mLs Intravenous Stopped 05/28/21 2128)   sodium chloride flush (NS) 0.9 % injection 3 mL (3 mLs Intravenous Given 05/28/21 2339)  sodium chloride  flush (NS) 0.9 % injection 3 mL (has no administration in time range)  0.9 %  sodium chloride infusion (has no administration in time range)  heparin ADULT infusion 100 units/mL (25000 units/221mL) (1,050 Units/hr Intravenous Infusion Verify 05/29/21 0325)  heparin injection 5,000 Units (has no administration in time range)  aspirin chewable tablet 324 mg (324 mg Oral Given 05/27/21 1900)  morphine 4 MG/ML injection 2 mg (2 mg Intravenous Given 05/27/21 1901)  heparin bolus via infusion 3,000 Units (3,000 Units Intravenous Bolus from Bag 05/27/21 1912)  iohexol (OMNIPAQUE) 350 MG/ML injection 100 mL (80 mLs Intravenous Contrast Given 05/27/21 1952)  nitroGLYCERIN (NITROGLYN) 2 % ointment 1 inch (1 inch Topical Given 05/27/21 2057)  heparin bolus via infusion 2,000 Units (2,000 Units Intravenous Bolus from Bag 05/28/21 0502)    ED Course/ Medical Decision Making/ A&P                           Medical Decision Making Amount and/or Complexity of Data Reviewed Labs: ordered. Radiology: ordered.  Risk OTC drugs. Prescription drug management. Decision regarding hospitalization.   This patient presents to the ED for concern of chest pain, this involves an extensive number of treatment options, and is a complaint that carries with it a high risk of complications and morbidity.  The differential diagnosis includes ACS, PE, pneumonia, metastatic disease, GERD, gastritis, pancreatitis, aortic dissection.   Co morbidities that complicate the patient evaluation  Malignant melanoma, HTN   Additional history obtained:  Additional history obtained from patient's son External records from outside source obtained and reviewed including EMR   Lab Tests:  I Ordered, and personally interpreted labs.  The pertinent results include: Elevated troponin consistent with NSTEMI   Imaging Studies  ordered:  I ordered imaging studies including chest x-ray, CTA chest, CT head I independently visualized and interpreted imaging which showed no evidence of PE, no evidence of pneumonia, no evidence of intracranial tumors to contraindicate heparinization. I agree with the radiologist interpretation   Cardiac Monitoring:  The patient was maintained on a cardiac monitor.  I personally viewed and interpreted the cardiac monitored which showed an underlying rhythm of: Sinus rhythm   Medicines ordered and prescription drug management:  I ordered medication including ASA, nitroglycerin, morphine, and heparin for treatment of ACS Reevaluation of the patient after these medicines showed that the patient improved I have reviewed the patients home medicines and have made adjustments as needed   Critical Interventions:  ASA, nitroglycerin, morphine, and heparin for treatment of ACS; consultation with cardiology   Consultations Obtained:  I requested consultation with the cardiology,  and discussed lab and imaging findings as well as pertinent plan - they recommend: Heparin, pain control, and admission to [facility] hospitalist with cardiology following in consultation   Problem List / ED Course:  Pleasant 81 year old female presenting to the ED for chest pain that radiates to shoulders, arms, back, and neck.  Onset of this pain was yesterday and it has worsened today.  EKG was obtained upon arrival which did not show ST segment elevation.  Patient underwent lab work which was notable for a troponin of 254.  Patient was informed of this result.  ASA, morphine, and nitroglycerin were given.  Heparin bolus and gtt. was ordered.  Cardiology was consulted who recommends admission to [facility].  Patient was agreeable to this.  Patient initial chest pain was 8/10 in severity.  On reassessment, patient reports 3/10  pain.  Patient was admitted to hospitalist for ongoing  management.   Reevaluation:  After the interventions noted above, I reevaluated the patient and found that they have :improved   Social Determinants of Health:  Patient has strong family support and access to outpatient medical care.  She is followed by oncology.   Dispostion:  After consideration of the diagnostic results and the patients response to treatment, I feel that the patent would benefit from admission to hospital.    CRITICAL CARE Performed by: Godfrey Pick   Total critical care time: 35 minutes  Critical care time was exclusive of separately billable procedures and treating other patients.  Critical care was necessary to treat or prevent imminent or life-threatening deterioration.  Critical care was time spent personally by me on the following activities: development of treatment plan with patient and/or surrogate as well as nursing, discussions with consultants, evaluation of patient's response to treatment, examination of patient, obtaining history from patient or surrogate, ordering and performing treatments and interventions, ordering and review of laboratory studies, ordering and review of radiographic studies, pulse oximetry and re-evaluation of patient's condition.         Final Clinical Impression(s) / ED Diagnoses Final diagnoses:  NSTEMI (non-ST elevated myocardial infarction) [facility])    Rx / DC Orders ED Discharge Orders     None         Godfrey Pick, MD 05/29/21 [phone]

## 2021-05-27 NOTE — Telephone Encounter (Signed)
Patent called again stating that her pain is in her chest and back, as well as all over her body.  States that she feels very weak and that son is going to take her to the ER.  Dr Delton Coombes made aware.

## 2021-05-27 NOTE — Assessment & Plan Note (Signed)
Following w/ HemOnc

## 2021-05-27 NOTE — ED Notes (Signed)
Date and time results received: 05/27/21 2013  Test: Troponin Critical Value: 998  Name of Provider Notified: Dr. Doren Custard   Orders Received? Or Actions Taken?:

## 2021-05-27 NOTE — Assessment & Plan Note (Signed)
Continue home benzo

## 2021-05-27 NOTE — Progress Notes (Signed)
ANTICOAGULATION CONSULT NOTE - Initial Consult  Pharmacy Consult for heparin Indication: chest pain/ACS  Allergies  Allergen Reactions   Tape     Pulls skin, please use paper tape    Patient Measurements: Height: 5\' 2"  (157.5 cm) Weight: 83.8 kg (184 lb 11.9 oz) IBW/kg (Calculated) : 50.1 Heparin Dosing Weight: 69 kg  Vital Signs: Temp: 97.5 F (36.4 C) (01/16 1649) Temp Source: Oral (01/16 1649) BP: 144/69 (01/16 1649) Pulse Rate: 80 (01/16 1649)  Labs: Recent Labs    05/27/21 1704  HGB 13.1  HCT 37.8  PLT 213  CREATININE 0.86  TROPONINIHS 254*    Estimated Creatinine Clearance: 52.4 mL/min (by C-G formula based on SCr of 0.86 mg/dL).   Medical History: Past Medical History:  Diagnosis Date   Arthritis    Hypertension    Port-A-Cath in place 04/30/2021    Medications:  (Not in a hospital admission)   Assessment: 81 yo F presented ot [facility] ED with chest pain for last 24 hours.  Initial Trop elevated at 254.  Pt is not on anticoagulants PTA.  Goal of Therapy:  Heparin level 0.3-0.7 units/ml Monitor platelets by anticoagulation protocol: Yes   Plan:  Give 3000 units bolus x 1 Start heparin infusion at 850 units/hr Check anti-Xa level in 8 hours and daily while on heparin Continue to monitor H&H and platelets  Devora Tortorella, [place]s 05/27/2021,7:02 PM

## 2021-05-27 NOTE — Assessment & Plan Note (Signed)
Cardiology following. Pending transfer to [facility]. Continue heparin. Adding prn morphine since she's still having mild chest pain, repeat EKG if needed (worsening chest pain, unstable VS, other s/s decompensation). Held home antihypertensives, changed to beta blocker only for now, added statin, continue ASA.

## 2021-05-27 NOTE — ED Triage Notes (Signed)
Chest pain onset last night °

## 2021-05-27 NOTE — Progress Notes (Signed)
ON-CALL CARDIOLOGY 05/27/21  Patient's name: Elizabeth Norman.   MRN: [id number].    DOB: May 05, 1941 Primary care provider: Neale Burly, MD. Primary cardiologist: NA  Interaction regarding this patient's care today: Called by Dr. Doren Custard at [facility] for Elizabeth Norman.  She presents to the hospital for evaluation of chest pain.  Discomfort started earlier today, radiates to bilateral arms and back.  First EKG does not meet STEMI criteria.  Initial troponin elevated suggestive of possible NSTEMI  CT PE protocol pending  Recently started on chemotherapy for metastatic malignant melanoma.  Not sure if this chemotherapy is curative or palliative will need additional information.  Impression: Precordial pain with elevated troponins concerning for non-STEMI   Recommendations: Continue with CT PE protocol to rule out pulmonary embolism in a patient with melanoma.  IV heparin per pharmacy for ACS protocol if no contraindications  Recommend cycling troponins for now until they peak  Repeat EKG if chest pain worsens or change in characteristics.  Call if any other questions or concerns arise.  Patient will be transferred from [facility] to [facility].  Admit to medicine cardiology will see her in consult.  [name], [place], [facility]  Pager: [phone] Office: [phone]

## 2021-05-27 NOTE — Assessment & Plan Note (Signed)
Stable from previous recent measurements, mild, d/c'ed chlorthalidone, follow AM BMP

## 2021-05-27 NOTE — Assessment & Plan Note (Addendum)
Slightly lower than previous recent measurements, mild, likely SIADH, added serum Osm, trend AM labs

## 2021-05-28 ENCOUNTER — Encounter (HOSPITAL_COMMUNITY)
Admission: EM | Disposition: A | Discharge status: Home / Self Care | Payer: Self-pay | Source: Home / Self Care | Attending: Internal Medicine

## 2021-05-28 ENCOUNTER — Other Ambulatory Visit: Payer: Self-pay

## 2021-05-28 HISTORY — PX: LEFT HEART CATH AND CORONARY ANGIOGRAPHY: CATH118249

## 2021-05-28 LAB — CBC
HCT: 32.7 % — ABNORMAL LOW (ref 36.0–46.0)
HCT: 32.9 % — ABNORMAL LOW (ref 36.0–46.0)
Hemoglobin: 11.1 g/dL — ABNORMAL LOW (ref 12.0–15.0)
Hemoglobin: 11.6 g/dL — ABNORMAL LOW (ref 12.0–15.0)
MCH: 30.8 pg (ref 26.0–34.0)
MCH: 30.6 pg (ref 26.0–34.0)
MCHC: 33.9 g/dL (ref 30.0–36.0)
MCHC: 35.3 g/dL (ref 30.0–36.0)
MCV: 90.8 fL (ref 80.0–100.0)
MCV: 86.8 fL (ref 80.0–100.0)
Platelets: 172 10*3/uL (ref 150–400)
Platelets: 177 10*3/uL (ref 150–400)
RBC: 3.6 MIL/uL — ABNORMAL LOW (ref 3.87–5.11)
RBC: 3.79 MIL/uL — ABNORMAL LOW (ref 3.87–5.11)
RDW: 11.5 % (ref 11.5–15.5)
RDW: 11.3 % — ABNORMAL LOW (ref 11.5–15.5)
WBC: 6.1 10*3/uL (ref 4.0–10.5)
WBC: 6.3 10*3/uL (ref 4.0–10.5)
nRBC: 0 % (ref 0.0–0.2)
nRBC: 0 % (ref 0.0–0.2)

## 2021-05-28 LAB — BASIC METABOLIC PANEL
Anion gap: 4 — ABNORMAL LOW (ref 5–15)
BUN: 11 mg/dL (ref 8–23)
CO2: 32 mmol/L (ref 22–32)
Calcium: 8.4 mg/dL — ABNORMAL LOW (ref 8.9–10.3)
Chloride: 94 mmol/L — ABNORMAL LOW (ref 98–111)
Creatinine, Ser: 0.77 mg/dL (ref 0.44–1.00)
GFR, Estimated: >60 mL/min (ref >60)
Glucose, Bld: 116 mg/dL — ABNORMAL HIGH (ref 70–99)
Potassium: 3.5 mmol/L (ref 3.5–5.1)
Sodium: 130 mmol/L — ABNORMAL LOW (ref 135–145)

## 2021-05-28 LAB — LIPID PANEL
Cholesterol: 143 mg/dL (ref 0–200)
HDL: 31 mg/dL — ABNORMAL LOW (ref >40)
LDL Cholesterol: 95 mg/dL (ref 0–99)
Total CHOL/HDL Ratio: 4.6 RATIO
Triglycerides: 86 mg/dL (ref <150)
VLDL: 17 mg/dL (ref 0–40)

## 2021-05-28 LAB — OSMOLALITY: Osmolality: 284 mOsm/kg (ref 275–295)

## 2021-05-28 LAB — HEPARIN LEVEL (UNFRACTIONATED)
Heparin Unfractionated: <0.1 IU/mL — ABNORMAL LOW (ref 0.30–0.70)
Heparin Unfractionated: 0.31 IU/mL (ref 0.30–0.70)

## 2021-05-28 LAB — TROPONIN I (HIGH SENSITIVITY): Troponin I (High Sensitivity): 12617 ng/L (ref <18)

## 2021-05-28 LAB — CREATININE, SERUM
Creatinine, Ser: 0.72 mg/dL (ref 0.44–1.00)
GFR, Estimated: >60 mL/min (ref >60)

## 2021-05-28 SURGERY — LEFT HEART CATH AND CORONARY ANGIOGRAPHY
Anesthesia: LOCAL

## 2021-05-28 MED ORDER — VERAPAMIL HCL 2.5 MG/ML IV SOLN
INTRAVENOUS | Status: DC | PRN
  Administered 2021-05-28: 10 mL via INTRA_ARTERIAL

## 2021-05-28 MED ORDER — FENTANYL CITRATE (PF) 100 MCG/2ML IJ SOLN
INTRAMUSCULAR | Status: AC
  Filled 2021-05-28: qty 2

## 2021-05-28 MED ORDER — IOHEXOL 350 MG/ML SOLN
INTRAVENOUS | Status: DC | PRN
  Administered 2021-05-28: 45 mL

## 2021-05-28 MED ORDER — ACETAMINOPHEN 325 MG PO TABS
650.0000 mg | ORAL_TABLET | ORAL | Status: DC | PRN
  Administered 2021-05-28 – 2021-05-29 (×2): 650 mg via ORAL
  Filled 2021-05-28 (×2): qty 2

## 2021-05-28 MED ORDER — FENTANYL CITRATE (PF) 100 MCG/2ML IJ SOLN
INTRAMUSCULAR | Status: DC | PRN
  Administered 2021-05-28: 25 ug via INTRAVENOUS

## 2021-05-28 MED ORDER — HEPARIN SODIUM (PORCINE) 1000 UNIT/ML IJ SOLN
INTRAMUSCULAR | Status: DC | PRN
  Administered 2021-05-28: 4000 [IU] via INTRAVENOUS

## 2021-05-28 MED ORDER — HEPARIN SODIUM (PORCINE) 1000 UNIT/ML IJ SOLN
INTRAMUSCULAR | Status: AC
  Filled 2021-05-28: qty 10

## 2021-05-28 MED ORDER — HEPARIN (PORCINE) IN NACL 1000-0.9 UT/500ML-% IV SOLN
INTRAVENOUS | Status: AC
  Filled 2021-05-28: qty 1000

## 2021-05-28 MED ORDER — LIDOCAINE HCL (PF) 1 % IJ SOLN
INTRAMUSCULAR | Status: AC
  Filled 2021-05-28: qty 30

## 2021-05-28 MED ORDER — HEPARIN SODIUM (PORCINE) 5000 UNIT/ML IJ SOLN: 5000.0000 [IU] | Freq: Three times a day (TID) | INTRAMUSCULAR | Status: DC

## 2021-05-28 MED ORDER — LIDOCAINE HCL (PF) 1 % IJ SOLN
INTRAMUSCULAR | Status: DC | PRN
  Administered 2021-05-28: 2 mL

## 2021-05-28 MED ORDER — SODIUM CHLORIDE 0.9 % IV SOLN: INTRAVENOUS | Status: DC

## 2021-05-28 MED ORDER — LABETALOL HCL 5 MG/ML IV SOLN: 10.0000 mg | INTRAVENOUS | Status: AC | PRN

## 2021-05-28 MED ORDER — SODIUM CHLORIDE 0.9 % IV SOLN: 250.0000 mL | INTRAVENOUS | Status: DC | PRN

## 2021-05-28 MED ORDER — SODIUM CHLORIDE 0.9% FLUSH: 3.0000 mL | INTRAVENOUS | Status: DC | PRN

## 2021-05-28 MED ORDER — MIDAZOLAM HCL 2 MG/2ML IJ SOLN
INTRAMUSCULAR | Status: AC
  Filled 2021-05-28: qty 2

## 2021-05-28 MED ORDER — HEPARIN BOLUS VIA INFUSION
2000.0000 [IU] | Freq: Once | INTRAVENOUS | Status: AC
  Administered 2021-05-28: 2000 [IU] via INTRAVENOUS

## 2021-05-28 MED ORDER — ONDANSETRON HCL 4 MG/2ML IJ SOLN: 4.0000 mg | Freq: Four times a day (QID) | INTRAMUSCULAR | Status: DC | PRN

## 2021-05-28 MED ORDER — HYDRALAZINE HCL 20 MG/ML IJ SOLN: 10.0000 mg | INTRAMUSCULAR | Status: AC | PRN

## 2021-05-28 MED ORDER — VERAPAMIL HCL 2.5 MG/ML IV SOLN
INTRAVENOUS | Status: AC
  Filled 2021-05-28: qty 2

## 2021-05-28 MED ORDER — SODIUM CHLORIDE 0.9 % IV SOLN: INTRAVENOUS | Status: AC

## 2021-05-28 MED ORDER — HEPARIN (PORCINE) IN NACL 1000-0.9 UT/500ML-% IV SOLN
INTRAVENOUS | Status: DC | PRN
  Administered 2021-05-28 (×2): 500 mL

## 2021-05-28 MED ORDER — SODIUM CHLORIDE 0.9% FLUSH
3.0000 mL | Freq: Two times a day (BID) | INTRAVENOUS | Status: DC
  Administered 2021-05-28 – 2021-05-29 (×2): 3 mL via INTRAVENOUS

## 2021-05-28 MED ORDER — SODIUM CHLORIDE 0.9% FLUSH
3.0000 mL | Freq: Two times a day (BID) | INTRAVENOUS | Status: DC
  Administered 2021-05-28: 3 mL via INTRAVENOUS

## 2021-05-28 MED ORDER — HEPARIN (PORCINE) 25000 UT/250ML-% IV SOLN
1050.0000 [IU]/h | INTRAVENOUS | Status: AC
  Administered 2021-05-29: 1050 [IU]/h via INTRAVENOUS
  Filled 2021-05-28: qty 250

## 2021-05-28 MED ORDER — MIDAZOLAM HCL 2 MG/2ML IJ SOLN
INTRAMUSCULAR | Status: DC | PRN
  Administered 2021-05-28: 1 mg via INTRAVENOUS

## 2021-05-28 MED ORDER — NITROGLYCERIN IN D5W 200-5 MCG/ML-% IV SOLN
0.0000 ug/min | INTRAVENOUS | Status: DC
  Administered 2021-05-28: 5 ug/min via INTRAVENOUS
  Filled 2021-05-28: qty 250

## 2021-05-28 SURGICAL SUPPLY — 11 items
CATH INFINITI 5 FR JL3.5 (CATHETERS) ×1 IMPLANT
CATH INFINITI JR4 5F (CATHETERS) ×1 IMPLANT
DEVICE RAD COMP TR BAND LRG (VASCULAR PRODUCTS) ×1 IMPLANT
GLIDESHEATH SLEND A-KIT 6F 22G (SHEATH) ×1 IMPLANT
GUIDEWIRE INQWIRE 1.5J.035X260 (WIRE) IMPLANT
INQWIRE 1.5J .035X260CM (WIRE) ×2
KIT HEART LEFT (KITS) ×2 IMPLANT
PACK CARDIAC CATHETERIZATION (CUSTOM PROCEDURE TRAY) ×2 IMPLANT
SYR CONTROL 10ML ANGIOGRAPHIC (SYRINGE) ×1 IMPLANT
TRANSDUCER W/STOPCOCK (MISCELLANEOUS) ×2 IMPLANT
TUBING CIL FLEX 10 FLL-RA (TUBING) ×2 IMPLANT

## 2021-05-28 NOTE — Progress Notes (Addendum)
Elizabeth Norman for IV Heparin Indication: chest pain/ACS  Allergies  Allergen Reactions   Tape     Pulls skin, please use paper tape    Patient Measurements: Height: 5\' 2"  (157.5 cm) Weight: 83.8 kg (184 lb 11.9 oz) IBW/kg (Calculated) : 50.1 Heparin Dosing Weight: 69 kg  Vital Signs: Temp: 98 F (36.7 C) (01/17 1304) Temp Source: Oral (01/17 1304) BP: 140/54 (01/17 1621) Pulse Rate: 96 (01/17 1621)  Labs: Recent Labs    05/27/21 1704 05/27/21 1908 05/28/21 0419 05/28/21 1235  HGB 13.1  --  11.1*  --   HCT 37.8  --  32.7*  --   PLT 213  --  172  --   HEPARINUNFRC  --   --  <0.10* 0.31  CREATININE 0.86  --  0.77  --   TROPONINIHS 254* 998* 12,617*  --     Estimated Creatinine Clearance: 56.3 mL/min (by C-G formula based on SCr of 0.77 mg/dL).  Medical History: Past Medical History:  Diagnosis Date   Arthritis    Hypertension    Malignant melanoma of lower leg, right ([place]) 03/27/2021   Port-A-Cath in place 04/30/2021    Assessment: 81 yr old woman presented to [facility] ED with chest pain and elevated troponins [phone]), NSTEMI. Pharmacy was consulted for IV heparin (pt was not on anticoagulant PTA).    Pt had heparin level at low end of goal range (0.31 units/ml) on heparin infusion at 1000 units/hr prior to cardiac cath this afternoon (cardiac cath report pending).  Pharmacy is consulted to resume IV heparin 8 hrs after sheath removal (per cath lab procedure log, sheath was removed at 1611 PM this afternoon) and continue heparin infusion until 0800 on 05/29/21.  H/H 11.1/32.7, plt 172  Goal of Therapy:  Heparin level 0.3-0.7 units/ml Monitor platelets by anticoagulation protocol: Yes   Plan:  Resume heparin infusion at 1050 units/hr at midnight tonight (~8 hrs after sheath removal) and continue until 0800 AM on 05/29/21 Check heparin level 7 hrs after resuming heparin infusion Monitor daily heparin level,  CBC Monitor for bleeding  Gillermina Hu, PharmD, BCPS, [facility] Clinical Pharmacist

## 2021-05-28 NOTE — Progress Notes (Signed)
PROGRESS NOTE     Elizabeth Norman, is a 81 y.o. female, DOB - May 22, 1940, HWE:[id number]  Admit date - 05/27/2021   Admitting Physician Emeterio Reeve, DO  Outpatient Primary MD for the patient is Neale Burly, MD  LOS - 1  Chief Complaint  Patient presents with   Chest Pain        Brief Narrative:  81 y.o. female with a past medical history of  HTN, OA, obesity, anxiety disorder, and melanoma with Port-A-Cath in place (04/30/2021) admitted on 05/27/2021 with NSTEMI--patient has persistent chest pains were started on IV heparin and IV nitro was added -Transferred to [facility] on 05/28/2021 for LHC and further cardiovascular evaluation  Assessment & Plan:   Principal Problem:   NSTEMI (non-ST elevated myocardial infarction) [facility]) Active Problems:   Malignant melanoma of lower leg, right (HCC)   Hyponatremia   Hypokalemia   Anxiety   1)NSTEMI--- chest pains improved on IV nitro drip -Troponin 254 >> 998>>12,617 -EKG without acute ST elevation Discussed with cardiologist Dr. [name], plan if for LHC by Dr. Virgina Jock -Continue IV heparin and IV nitro drip -Transferred to [facility] on 05/28/2021 for LHC and further cardiovascular evaluation LDL 95, HDL 31--- LDL and HDL are not at goal, consider increasing Lipitor discharge or switching to Crestor -Continue aspirin and metoprolol -May use IV morphine sulfate as needed  2) chronic hyponatremia--hold chlorthalidone, avoid dehydration  3) acute anemia--- Hgb 11.1 from 13.1 on admission -No Overt bleeding noted - may be hemodilution but watch closely while on IV heparin  4) possible gallbladder mass----please see CT report,  --right upper quadrant ultrasound requested  5) anxiety disorder--- May use Xanax as needed  6)HTN--stable, chlorthalidone discontinued due to hyponatremia -May use metoprolol -  Disposition/Need for in-Hospital Stay- patient unable to be discharged at this time due to  --NSTEMI with recurrent chest pains requiring IV nitro and IV heparin pending further cardiology evaluation and left heart cath -Possible discharge in 1 to 2 days pending finding of left heart cath  Status is: Inpatient  Remains inpatient appropriate because:   Disposition: The patient is from: Home              Anticipated d/c is to: Home              Anticipated d/c date is: 1 day              Patient currently is not medically stable to d/c. Barriers: Not Clinically Stable-   Code Status :  -  Code Status: Full Code   Family Communication:    (patient is alert, awake and coherent)   Consults  :  cardiology  DVT Prophylaxis  :   - SCDs /Iv Heparin    Lab Results  Component Value Date   PLT 172 05/28/2021    Inpatient Medications  Scheduled Meds:  [MAR Hold] aspirin EC  81 mg Oral Daily   [MAR Hold] atorvastatin  40 mg Oral Daily   [MAR Hold] metoprolol tartrate  25 mg Oral BID   [MAR Hold] sodium chloride flush  3 mL Intravenous Q12H   Continuous Infusions:  sodium chloride Stopped (05/28/21 0100)   sodium chloride 100 mL/hr at 05/28/21 1319   heparin 1,000 Units/hr (05/28/21 1319)   lactated ringers Stopped (05/28/21 1223)   [MAR Hold] nitroGLYCERIN 5 mcg/min (05/28/21 1319)   PRN Meds:.[MAR Hold] acetaminophen, [MAR Hold] ALPRAZolam, [MAR Hold]  morphine injection, [MAR Hold] nitroGLYCERIN, [MAR Hold]  ondansetron (ZOFRAN) IV, [MAR Hold] sodium chloride flush   Anti-infectives (From admission, onward)    None       Subjective: [place] Care today has no fevers, no emesis,   -Had recurrent chest pains overnight--- improving with IV nitro drip -No bleeding concerns on IV heparin drip -Repeat EKG without acute ST changes  No Nausea, Vomiting or Diarrhea -  Objective: Vitals:   05/28/21 1130 05/28/21 1200 05/28/21 1230 05/28/21 1304  BP: 125/65 133/66 139/65 133/62  Pulse: 79 80 79 85  Resp: 17 14 16 18   Temp:    98 F (36.7 C)  TempSrc:    Oral   SpO2: 91% 95% 96% 96%  Weight:      Height:        Intake/Output Summary (Last 24 hours) at 05/28/2021 1414 Last data filed at 05/28/2021 1319 Gross per 24 hour  Intake 1613.41 ml  Output --  Net 1613.41 ml   Filed Weights   05/27/21 1720  Weight: 83.8 kg    Physical Exam  Gen:- Awake Alert, speaking in complete sentences, no acute distress HEENT:- [place].AT, No sclera icterus Neck-Supple Neck,No JVD,.  Lungs-  CTAB , fair symmetrical air movement CV- S1, S2 normal, regular  Abd-  +ve B.Sounds, Abd Soft, No tenderness,    Extremity/Skin:- No  edema, pedal pulses present  Psych-affect is appropriate, oriented x3 Neuro-no new focal deficits, no tremors MSK--right lower extremity scars from prior melanoma surgery  Data Reviewed: I have personally reviewed following labs and imaging studies  CBC: Recent Labs  Lab 05/27/21 1704 05/28/21 0419  WBC 5.1 6.1  HGB 13.1 11.1*  HCT 37.8 32.7*  MCV 90.9 90.8  PLT 213 347   Basic Metabolic Panel: Recent Labs  Lab 05/27/21 1704 05/28/21 0419  NA 131* 130*  K 3.3* 3.5  CL 93* 94*  CO2 28 32  GLUCOSE 147* 116*  BUN 13 11  CREATININE 0.86 0.77  CALCIUM 9.1 8.4*   GFR: Estimated Creatinine Clearance: 56.3 mL/min (by C-G formula based on SCr of 0.77 mg/dL). Liver Function Tests: No results for input(s): AST, ALT, ALKPHOS, BILITOT, PROT, ALBUMIN in the last 168 hours. No results for input(s): LIPASE, AMYLASE in the last 168 hours. No results for input(s): AMMONIA in the last 168 hours. Coagulation Profile: No results for input(s): INR, PROTIME in the last 168 hours. Cardiac Enzymes: No results for input(s): CKTOTAL, CKMB, CKMBINDEX, TROPONINI in the last 168 hours. BNP (last 3 results) No results for input(s): PROBNP in the last 8760 hours. HbA1C: No results for input(s): HGBA1C in the last 72 hours. CBG: No results for input(s): GLUCAP in the last 168 hours. Lipid Profile: Recent Labs    05/28/21 0419  CHOL 143   HDL 31*  LDLCALC 95  TRIG 86  CHOLHDL 4.6   Thyroid Function Tests: No results for input(s): TSH, T4TOTAL, FREET4, T3FREE, THYROIDAB in the last 72 hours. Anemia Panel: No results for input(s): VITAMINB12, FOLATE, FERRITIN, TIBC, IRON, RETICCTPCT in the last 72 hours. Urine analysis: No results found for: COLORURINE, APPEARANCEUR, LABSPEC, PHURINE, GLUCOSEU, HGBUR, BILIRUBINUR, KETONESUR, PROTEINUR, UROBILINOGEN, NITRITE, LEUKOCYTESUR Sepsis Labs: @LABRCNTIP (procalcitonin:4,lacticidven:4)  ) Recent Results (from the past 240 hour(s))  Resp Panel by RT-PCR (Flu A&B, Covid) Nasopharyngeal Swab     Status: None   Collection Time: 05/27/21  8:40 PM   Specimen: Nasopharyngeal Swab; Nasopharyngeal(NP) swabs in vial transport medium  Result Value Ref Range Status   SARS Coronavirus 2 by RT PCR NEGATIVE NEGATIVE  Final    Comment: (NOTE) SARS-CoV-2 target nucleic acids are NOT DETECTED.  The SARS-CoV-2 RNA is generally detectable in upper respiratory specimens during the acute phase of infection. The lowest concentration of SARS-CoV-2 viral copies this assay can detect is 138 copies/mL. A negative result does not preclude SARS-Cov-2 infection and should not be used as the sole basis for treatment or other patient management decisions. A negative result may occur with  improper specimen collection/handling, submission of specimen other than nasopharyngeal swab, presence of viral mutation(s) within the areas targeted by this assay, and inadequate number of viral copies(<138 copies/mL). A negative result must be combined with clinical observations, patient history, and epidemiological information. The expected result is Negative.  Fact Sheet for Patients:  EntrepreneurPulse.com.au  Fact Sheet for Healthcare Providers:  IncredibleEmployment.be  This test is no t yet approved or cleared by the [place] FDA and  has been authorized for detection  and/or diagnosis of SARS-CoV-2 by FDA under an Emergency Use Authorization (EUA). This EUA will remain  in effect (meaning this test can be used) for the duration of the COVID-19 declaration under Section 564(b)(1) of the Act, 21 U.S.C.section 360bbb-3(b)(1), unless the authorization is terminated  or revoked sooner.       Influenza A by PCR NEGATIVE NEGATIVE Final   Influenza B by PCR NEGATIVE NEGATIVE Final    Comment: (NOTE) The Xpert Xpress SARS-CoV-2/FLU/RSV plus assay is intended as an aid in the diagnosis of influenza from Nasopharyngeal swab specimens and should not be used as a sole basis for treatment. Nasal washings and aspirates are unacceptable for Xpert Xpress SARS-CoV-2/FLU/RSV testing.  Fact Sheet for Patients: EntrepreneurPulse.com.au  Fact Sheet for Healthcare Providers: IncredibleEmployment.be  This test is not yet approved or cleared by the [place] FDA and has been authorized for detection and/or diagnosis of SARS-CoV-2 by FDA under an Emergency Use Authorization (EUA). This EUA will remain in effect (meaning this test can be used) for the duration of the COVID-19 declaration under Section 564(b)(1) of the Act, 21 U.S.C. section 360bbb-3(b)(1), unless the authorization is terminated or revoked.  Performed at [facility], [address], [place], [place] [address]       Radiology Studies: DG Chest 2 View  Result Date: 05/27/2021 CLINICAL DATA:  Chest pain EXAM: CHEST - 2 VIEW COMPARISON:  None. FINDINGS: The heart size and mediastinal contours are within normal limits. Right central venous line is identified with distal tip in the superior vena cava. Both lungs are clear. Degenerative joint changes of the spine noted. IMPRESSION: No active cardiopulmonary disease. Electronically Signed   By: Abelardo Diesel M.D.   On: 05/27/2021 17:22   CT Head Wo Contrast  Result Date: 05/27/2021 CLINICAL DATA:  History of melanoma  EXAM: CT HEAD WITHOUT CONTRAST TECHNIQUE: Contiguous axial images were obtained from the base of the skull through the vertex without intravenous contrast. RADIATION DOSE REDUCTION: This exam was performed according to the departmental dose-optimization program which includes automated exposure control, adjustment of the mA and/or kV according to patient size and/or use of iterative reconstruction technique. COMPARISON:  None. FINDINGS: Brain: No acute territorial infarction, hemorrhage or intracranial mass. Mild atrophy. Minimal chronic small vessel ischemic changes of the white matter. Ventricles are nonenlarged Vascular: No hyperdense vessels.  No unexpected calcification Skull: Normal. Negative for fracture or focal lesion. Sinuses/Orbits: No acute finding. Other: None IMPRESSION: 1. No CT evidence for acute intracranial abnormality. 2. Mild atrophy Electronically Signed   By: Donavan Foil  M.D.   On: 05/27/2021 20:10   CT Angio Chest PE W and/or Wo Contrast  Result Date: 05/27/2021 CLINICAL DATA:  Chest pain EXAM: CT ANGIOGRAPHY CHEST WITH CONTRAST TECHNIQUE: Multidetector CT imaging of the chest was performed using the standard protocol during bolus administration of intravenous contrast. Multiplanar CT image reconstructions and MIPs were obtained to evaluate the vascular anatomy. RADIATION DOSE REDUCTION: This exam was performed according to the departmental dose-optimization program which includes automated exposure control, adjustment of the mA and/or kV according to patient size and/or use of iterative reconstruction technique. CONTRAST:  79mL OMNIPAQUE IOHEXOL 350 MG/ML SOLN COMPARISON:  Chest x-ray 05/27/2021 FINDINGS: Cardiovascular: Satisfactory opacification of the pulmonary arteries to the segmental level. No evidence of pulmonary embolism. Mild aortic atherosclerosis. No aneurysm. Mild coronary vascular calcification. Mild cardiomegaly. No pericardial effusion Mediastinum/Nodes: Midline trachea.  Homogeneously enlarged thyroid. No suspicious lymph nodes. Esophagus within normal limits. Lungs/Pleura: Lungs are clear. No pleural effusion or pneumothorax. Upper Abdomen: Hyperdensity within the gallbladder with appearance not typical for stones. Musculoskeletal: No acute osseous abnormality Review of the MIP images confirms the above findings. IMPRESSION: 1. Negative for acute pulmonary embolus.  Clear lung fields. 2. Hyperdensity within the gallbladder with slightly atypical appearance for stone disease, question tumefactive sludge or mass. Suggest correlation with right upper quadrant ultrasound. Aortic Atherosclerosis (ICD10-I70.0). Electronically Signed   By: Donavan Foil M.D.   On: 05/27/2021 20:08     Scheduled Meds:  [MAR Hold] aspirin EC  81 mg Oral Daily   [MAR Hold] atorvastatin  40 mg Oral Daily   [MAR Hold] metoprolol tartrate  25 mg Oral BID   [MAR Hold] sodium chloride flush  3 mL Intravenous Q12H   Continuous Infusions:  sodium chloride Stopped (05/28/21 0100)   sodium chloride 100 mL/hr at 05/28/21 1319   heparin 1,000 Units/hr (05/28/21 1319)   lactated ringers Stopped (05/28/21 1223)   [MAR Hold] nitroGLYCERIN 5 mcg/min (05/28/21 1319)     LOS: 1 day    Roxan Hockey M.D on 05/28/2021 at 2:14 PM  Go to [email or web address] - for contact info  [facility] - Office  [phone]  If 7PM-7AM, please contact night-coverage [email or web address] Password [facility] 05/28/2021, 2:14 PM

## 2021-05-28 NOTE — Interval H&P Note (Signed)
History and Physical Interval Note:  05/28/2021 3:36 PM  Elizabeth Norman  has presented today for surgery, with the diagnosis of nstemi.  The various methods of treatment have been discussed with the patient and family. After consideration of risks, benefits and other options for treatment, the patient has consented to  Procedure(s): LEFT HEART CATH AND CORONARY ANGIOGRAPHY (N/A) as a surgical intervention.  The patient's history has been reviewed, patient examined, no change in status, stable for surgery.  I have reviewed the patient's chart and labs.  Questions were answered to the patient's satisfaction.   2016 Appropriate Use Criteria for Coronary Revascularization in Patients With Acute Coronary Syndrome NSTEMI/UA High Risk (TIMI Score 5-7) NSTEMI/Unstable angina, stabilized patient at high risk Link Here: sistemancia.com Indication:  Revascularization by PCI or CABG of 1 or more arteries in a patient with NSTEMI or unstable angina with Stabilization after presentation High risk for clinical events A (7) Indication: 16; Score 7    [place]

## 2021-05-28 NOTE — ED Notes (Signed)
Pt transported out of dept via stretcher with [facility]

## 2021-05-28 NOTE — TOC Progression Note (Signed)
°  Transition of Care [facility]) Screening Note   Patient Details  Name: MARVIS BAKKEN Date of Birth: [birth year]   Transition of Care [facility]) CM/SW Contact:    Shade Flood, LCSW Phone Number: 05/28/2021, 11:10 AM    Transition of Care Department [facility]) has reviewed patient and no TOC needs have been identified at this time. We will continue to monitor patient advancement through interdisciplinary progression rounds. If new patient transition needs arise, please place a TOC consult.

## 2021-05-28 NOTE — ED Notes (Signed)
This nurse gave update to Dr. Terri Skains at this time.

## 2021-05-28 NOTE — Progress Notes (Signed)
[place] for heparin Indication: chest pain/ACS  Allergies  Allergen Reactions   Tape     Pulls skin, please use paper tape    Patient Measurements: Height: 5\' 2"  (157.5 cm) Weight: 83.8 kg (184 lb 11.9 oz) IBW/kg (Calculated) : 50.1 Heparin Dosing Weight: 69 kg  Vital Signs: BP: 108/57 (01/17 0430) Pulse Rate: 76 (01/17 0430)  Labs: Recent Labs    05/27/21 1704 05/27/21 1908 05/28/21 0419  HGB 13.1  --  11.1*  HCT 37.8  --  32.7*  PLT 213  --  172  HEPARINUNFRC  --   --  <0.10*  CREATININE 0.86  --  0.77  TROPONINIHS 254* 998*  --      Estimated Creatinine Clearance: 56.3 mL/min (by C-G formula based on SCr of 0.77 mg/dL).   Medical History: Past Medical History:  Diagnosis Date   Arthritis    Hypertension    Malignant melanoma of lower leg, right ([place]) 03/27/2021   Port-A-Cath in place 04/30/2021    Medications:  (Not in a hospital admission)  Assessment: 81 yo F presented ot [facility] ED with chest pain for last 24 hours.  Initial Trop elevated at 254.  Pt is not on anticoagulants PTA.  1/17 AM update:  Heparin level low  Goal of Therapy:  Heparin level 0.3-0.7 units/ml Monitor platelets by anticoagulation protocol: Yes   Plan:  Heparin 2000 units [place] heparin to 1000 units/hr 1300 heparin level  Narda Bonds, PharmD, BCPS Clinical Pharmacist Phone: [phone]

## 2021-05-28 NOTE — ED Notes (Signed)
Assisted pt to bedside potty.

## 2021-05-28 NOTE — Progress Notes (Addendum)
Cardiology Progress Note.   Patient still remains at [facility].   Called the RN to see how the patient is doing.   I have been informed by [name] (her RN) that patient was having CP majority of the night but now CP free since nitro gtt started 602am.   Check EKG stat.   If she has continued chest pain despite being on nitro drip or EKG shows STEMI she will need to be emergently transferred to [facility].   If the CP is controlled on nitro gtt and EKG does not show STEMI will transfer her urgently later today to cath lab for angiography.   I have sent a secure chat message to attending (Dr. Joesph Fillers) awaiting response to discuss plan of care.   Thanks to her RN Ms. [name] for her help in taking care of  Elizabeth Norman.   Plan  of care also discussed with Dr. Virgina Jock.   [name] [facility]  Pager: [phone] Office: [phone]   Addenum:  EKG shows NSR without injury pattern.   [name], [place], [facility]  Pager: [phone] Office: [phone] 7:20 AM

## 2021-05-29 ENCOUNTER — Telehealth: Payer: Self-pay | Admitting: *Deleted

## 2021-05-29 ENCOUNTER — Encounter (HOSPITAL_COMMUNITY): Payer: Self-pay | Admitting: Cardiology

## 2021-05-29 ENCOUNTER — Inpatient Hospital Stay (HOSPITAL_COMMUNITY): Payer: Medicare Other

## 2021-05-29 ENCOUNTER — Other Ambulatory Visit (HOSPITAL_COMMUNITY): Payer: Self-pay

## 2021-05-29 ENCOUNTER — Encounter (HOSPITAL_COMMUNITY): Payer: Self-pay | Admitting: Hematology

## 2021-05-29 ENCOUNTER — Encounter: Payer: Self-pay | Admitting: *Deleted

## 2021-05-29 DIAGNOSIS — I214 Non-ST elevation (NSTEMI) myocardial infarction: Secondary | ICD-10-CM

## 2021-05-29 DIAGNOSIS — I409 Acute myocarditis, unspecified: Secondary | ICD-10-CM

## 2021-05-29 LAB — COMPREHENSIVE METABOLIC PANEL
ALT: 19 U/L (ref 0–44)
AST: 61 U/L — ABNORMAL HIGH (ref 15–41)
Albumin: 2.8 g/dL — ABNORMAL LOW (ref 3.5–5.0)
Alkaline Phosphatase: 63 U/L (ref 38–126)
Anion gap: 11 (ref 5–15)
BUN: <5 mg/dL — ABNORMAL LOW (ref 8–23)
CO2: 30 mmol/L (ref 22–32)
Calcium: 8.8 mg/dL — ABNORMAL LOW (ref 8.9–10.3)
Chloride: 93 mmol/L — ABNORMAL LOW (ref 98–111)
Creatinine, Ser: 0.76 mg/dL (ref 0.44–1.00)
GFR, Estimated: >60 mL/min (ref >60)
Glucose, Bld: 109 mg/dL — ABNORMAL HIGH (ref 70–99)
Potassium: 3 mmol/L — ABNORMAL LOW (ref 3.5–5.1)
Sodium: 134 mmol/L — ABNORMAL LOW (ref 135–145)
Total Bilirubin: 1 mg/dL (ref 0.3–1.2)
Total Protein: 5.9 g/dL — ABNORMAL LOW (ref 6.5–8.1)

## 2021-05-29 LAB — CBC
HCT: 33.8 % — ABNORMAL LOW (ref 36.0–46.0)
Hemoglobin: 11.8 g/dL — ABNORMAL LOW (ref 12.0–15.0)
MCH: 30.8 pg (ref 26.0–34.0)
MCHC: 34.9 g/dL (ref 30.0–36.0)
MCV: 88.3 fL (ref 80.0–100.0)
Platelets: 173 10*3/uL (ref 150–400)
RBC: 3.83 MIL/uL — ABNORMAL LOW (ref 3.87–5.11)
RDW: 11.4 % — ABNORMAL LOW (ref 11.5–15.5)
WBC: 6.1 10*3/uL (ref 4.0–10.5)
nRBC: 0 % (ref 0.0–0.2)

## 2021-05-29 LAB — HEMOGLOBIN A1C
Hgb A1c MFr Bld: 5.6 % (ref 4.8–5.6)
Mean Plasma Glucose: 114.02 mg/dL

## 2021-05-29 LAB — BRAIN NATRIURETIC PEPTIDE: B Natriuretic Peptide: 516.6 pg/mL — ABNORMAL HIGH (ref 0.0–100.0)

## 2021-05-29 LAB — MAGNESIUM: Magnesium: 1.4 mg/dL — ABNORMAL LOW (ref 1.7–2.4)

## 2021-05-29 MED ORDER — ASPIRIN 81 MG PO TBEC: 81.0000 mg | DELAYED_RELEASE_TABLET | Freq: Every day | ORAL | 11 refills | Status: DC

## 2021-05-29 MED ORDER — GADOBUTROL 1 MMOL/ML IV SOLN
10.0000 mL | Freq: Once | INTRAVENOUS | Status: AC | PRN
  Administered 2021-05-29: 10 mL via INTRAVENOUS

## 2021-05-29 MED ORDER — INSULIN ASPART 100 UNIT/ML IJ SOLN: 0.0000 [IU] | Freq: Three times a day (TID) | INTRAMUSCULAR | Status: DC

## 2021-05-29 MED ORDER — POTASSIUM CHLORIDE CRYS ER 20 MEQ PO TBCR
40.0000 meq | EXTENDED_RELEASE_TABLET | ORAL | Status: AC
  Administered 2021-05-29 (×2): 40 meq via ORAL
  Filled 2021-05-29 (×2): qty 2

## 2021-05-29 MED ORDER — ATORVASTATIN CALCIUM 40 MG PO TABS: 40.0000 mg | ORAL_TABLET | Freq: Every day | ORAL | 0 refills | Status: DC

## 2021-05-29 MED ORDER — MAGNESIUM SULFATE 50 % IJ SOLN
3.0000 g | Freq: Once | INTRAVENOUS | Status: AC
  Administered 2021-05-29: 3 g via INTRAVENOUS
  Filled 2021-05-29: qty 6

## 2021-05-29 MED ORDER — METOPROLOL TARTRATE 25 MG PO TABS: 25.0000 mg | ORAL_TABLET | Freq: Two times a day (BID) | ORAL | 0 refills | Status: DC

## 2021-05-29 MED ORDER — SODIUM CHLORIDE 0.9 % IV SOLN
1000.0000 mg | Freq: Once | INTRAVENOUS | Status: AC
  Administered 2021-05-29: 1000 mg via INTRAVENOUS
  Filled 2021-05-29: qty 16

## 2021-05-29 NOTE — Progress Notes (Addendum)
I personally spoke with patient's oncologist Dr. Delton Coombes. NSTEMI without obstructive CAD, MINOCA picture. Even if she were to have a flish occluded OM1, she is chest pain free and I did not benefit from revascularization to the same. Dr. Lamonte Richer asked if myocarditis could be a possibility, which is seen in 1% patients on stated immunotherapy. I will obtain cardiac MRI to look further into this possbility, and also explain MINOCA vs true infarct ?flush occluded OM.  Dr. Delton Coombes recommended IV methyl prednisolone for 3 days, then PO steroids.    Nigel Mormon, MD Pager: [phone] Office: [phone]

## 2021-05-29 NOTE — Telephone Encounter (Signed)
-----   Message from Holley Dexter, RN sent at 05/28/2021  2:56 PM EST ----- Sent to [facility] 1/17 for non stemi

## 2021-05-29 NOTE — Progress Notes (Signed)
Nursing dc noted  Patient alert and oriented. Both patient and son Elizabeth Norman verbalized understanding of dc instructions. Ccmd notified of dc order. All belongings and dc papers given to patient. Son will take patient home via car.

## 2021-05-29 NOTE — Progress Notes (Signed)
°  Echocardiogram 2D Echocardiogram has been performed.  Bobbye Charleston 05/29/2021, 1:31 PM

## 2021-05-29 NOTE — Progress Notes (Incomplete)
Subjective:  ***  Objective:  Vital Signs in the last 24 hours: Temp:  [97.9 F (36.6 C)-98.5 F (36.9 C)] 97.9 F (36.6 C) (01/18 0408) Pulse Rate:  [75-128] 84 (01/18 0408) Resp:  [12-28] 12 (01/18 0408) BP: (104-164)/(47-80) 123/60 (01/18 0408) SpO2:  [91 %-100 %] 96 % (01/18 0408) Weight:  [80.8 kg] 80.8 kg (01/18 0408)  Intake/Output from previous day: 01/17 0701 - 01/18 0700 In: 1769.8 [P.O.:120; I.V.:1649.8] Out: 800 [Urine:800]  Physical Exam   Lab Results: BMP Recent Labs    05/27/21 1704 05/28/21 0419 05/28/21 2054 05/29/21 0248  NA 131* 130*  --  134*  K 3.3* 3.5  --  3.0*  CL 93* 94*  --  93*  CO2 28 32  --  30  GLUCOSE 147* 116*  --  109*  BUN 13 11  --  <5*  CREATININE 0.86 0.77 0.72 0.76  CALCIUM 9.1 8.4*  --  8.8*  GFRNONAA >60 >60 >60 >60    CBC Recent Labs  Lab 05/29/21 0248  WBC 6.1  RBC 3.83*  HGB 11.8*  HCT 33.8*  PLT 173  MCV 88.3  MCH 30.8  MCHC 34.9  RDW 11.4*    HEMOGLOBIN A1C No results found for: HGBA1C, MPG  Cardiac Panel (last 3 results) No results for input(s): CKTOTAL, CKMB, TROPONINI, RELINDX in the last 8760 hours.  BNP (last 3 results) No results for input(s): BNP in the last 8760 hours.  TSH Recent Labs    05/09/21 0811  TSH 1.390    Lipid Panel     Component Value Date/Time   CHOL 143 05/28/2021 0419   TRIG 86 05/28/2021 0419   HDL 31 (L) 05/28/2021 0419   CHOLHDL 4.6 05/28/2021 0419   VLDL 17 05/28/2021 0419   LDLCALC 95 05/28/2021 0419     Hepatic Function Panel Recent Labs    05/09/21 0811 05/16/21 1122 05/29/21 0248  PROT 7.4 7.0 5.9*  ALBUMIN 3.9 3.7 2.8*  AST 20 19 61*  ALT 11 13 19   ALKPHOS 63 74 63  BILITOT 0.9 1.1 1.0    Imaging: {imaging:[id number]}  Cardiac Studies:  EKG ***:  Echocardiogram ***:  Assessment & Recommendations:  ***  ***    Nigel Mormon, MD Pager: [phone] Office: [phone]

## 2021-05-29 NOTE — Progress Notes (Signed)
Order placed for CPK, BNP and Troponin for 1/19 and 1/20 per Dr. Delton Coombes

## 2021-05-29 NOTE — Discharge Summary (Signed)
Physician Discharge Summary  Elizabeth Norman NAT:[id number] DOB: [birth year] DOA: 05/27/2021  PCP: Neale Burly, MD  Admit date: 05/27/2021 Discharge date: 05/29/2021  Admitted From: Home Disposition: Home  Recommendations for Outpatient Follow-up:  Follow-up at [facility] cancer center tomorrow and further management to be discussed  Home Health: N/A Equipment/Devices: N/A  Discharge Condition: Stable CODE STATUS: Full code Diet recommendation: Low-salt diet  Discharge summary: 81 year old female with history of hypertension, anxiety disorder, melanoma with a Port-A-Cath in place and currently on immunotherapy with Opdualog presented to [facility] with persistent chest pain.  Initial troponin more than 12,000, started on IV heparin and IV nitroglycerin and transferred to [facility] for left heart cath and cardiovascular evaluation.  Cardiac cath was with benign findings.  MRI of the heart however showed myocarditis with no complications.  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  Troponins [phone].  Initially treated with heparin and nitro Underwent left heart cath and found to have no obstructive coronary artery disease. -MRI of the heart was consistent with focal myocarditis with no complication, normal ejection fraction. -Started on methylprednisone 1 g daily for 3 days, patient received first dose in the hospital however she did not want to stay for next doses.  This was arranged with cancer center at [facility] for next 2 days.  Will be seen by her oncologist and treated with subsequent oral steroids. For cardiovascular risk modification, she will be on aspirin, Lipitor, beta-blockers.  Currently asymptomatic.  Chronic hyponatremia: Euvolemic.  Avoiding chlorthalidone.  Will discontinue atenolol chlorthalidone when she is taking metoprolol.  Hypokalemia: Replaced before  discharge.  Hypomagnesemia: Replaced with IV magnesium before discharge.  Abnormal gallbladder scan: Right upper quadrant ultrasound was essentially normal with evidence of gallstones but no vascular mass.        Discharge Diagnoses:  Principal Problem:   NSTEMI (non-ST elevated myocardial infarction) ([place]) Active Problems:   Malignant melanoma of lower leg, right ([place])   Port-A-Cath in place   Hyponatremia   Hypokalemia   Anxiety    Discharge Instructions  Discharge Instructions     Call MD for:  severe uncontrolled pain   Complete by: As directed    Diet - low sodium heart healthy   Complete by: As directed    Discharge instructions   Complete by: As directed    Follow up tomorrow at [facility] center .   Increase activity slowly   Complete by: As directed       Allergies as of 05/29/2021       Reactions   Tape    Pulls skin, please use paper tape        Medication List     STOP taking these medications    atenolol-chlorthalidone 50-25 MG tablet Commonly known as: TENORETIC   diphenhydramine-acetaminophen 25-500 MG Tabs tablet Commonly known as: TYLENOL PM       TAKE these medications    alendronate 70 MG tablet Commonly known as: FOSAMAX Take 70 mg by mouth once a week.   ALPRAZolam 0.5 MG tablet Commonly known as: XANAX Take 0.5 mg by mouth 3 (three) times daily.   aspirin 81 MG EC tablet Take 1 tablet (81 mg total) by mouth daily. Swallow whole. Start taking on: May 30, 2021   atorvastatin 40 MG tablet Commonly known as: LIPITOR Take 1 tablet (40 mg total) by mouth daily. Start taking on: May 30, 2021   calcium carbonate 1250 (500 Ca) MG  tablet Commonly known as: OS-CAL - dosed in mg of elemental calcium Take 1 tablet by mouth 3 (three) times a week.   cholecalciferol 1000 units tablet Commonly known as: VITAMIN D Take 1,000 Units by mouth 3 (three) times a week.   lidocaine-prilocaine cream Commonly known  as: EMLA Apply a small amount to port a cath site and cover with plastic wrap 1 hour prior to port flush appointments   metoprolol tartrate 25 MG tablet Commonly known as: LOPRESSOR Take 1 tablet (25 mg total) by mouth 2 (two) times daily.   Opdualag 240-80 MG/20ML Soln Generic drug: Nivolumab-Relatlimab-rmbw Inject into the vein every 28 (twenty-eight) days.   prednisoLONE acetate 1 % ophthalmic suspension Commonly known as: PRED FORTE Place 1 drop into both eyes See admin instructions. Instill one drop into the right eye twice daily and one drop into the left eye once daily.   VITAMIN B-12 PO Take 1 tablet by mouth 3 (three) times a week.        Follow-up Information     Derek Jack, MD Follow up in 1 day(s).   Specialty: Hematology Why: please follow up 1/19 and 1/20 for steroid injection Contact information: [place] [address] [phone]         Nigel Mormon, MD Follow up.   Specialties: Cardiology, Radiology Contact information: [place] [address] [phone]                Allergies  Allergen Reactions   Tape     Pulls skin, please use paper tape    Consultations: Cardiology   Procedures/Studies: DG Chest 2 View  Result Date: 05/27/2021 CLINICAL DATA:  Chest pain EXAM: CHEST - 2 VIEW COMPARISON:  None. FINDINGS: The heart size and mediastinal contours are within normal limits. Right central venous line is identified with distal tip in the superior vena cava. Both lungs are clear. Degenerative joint changes of the spine noted. IMPRESSION: No active cardiopulmonary disease. Electronically Signed   By: Abelardo Diesel M.D.   On: 05/27/2021 17:22   CT Head Wo Contrast  Result Date: 05/27/2021 CLINICAL DATA:  History of melanoma EXAM: CT HEAD WITHOUT CONTRAST TECHNIQUE: Contiguous axial images were obtained from the base of the skull through the vertex without intravenous contrast.  RADIATION DOSE REDUCTION: This exam was performed according to the departmental dose-optimization program which includes automated exposure control, adjustment of the mA and/or kV according to patient size and/or use of iterative reconstruction technique. COMPARISON:  None. FINDINGS: Brain: No acute territorial infarction, hemorrhage or intracranial mass. Mild atrophy. Minimal chronic small vessel ischemic changes of the white matter. Ventricles are nonenlarged Vascular: No hyperdense vessels.  No unexpected calcification Skull: Normal. Negative for fracture or focal lesion. Sinuses/Orbits: No acute finding. Other: None IMPRESSION: 1. No CT evidence for acute intracranial abnormality. 2. Mild atrophy Electronically Signed   By: Donavan Foil M.D.   On: 05/27/2021 20:10   CT Angio Chest PE W and/or Wo Contrast  Result Date: 05/27/2021 CLINICAL DATA:  Chest pain EXAM: CT ANGIOGRAPHY CHEST WITH CONTRAST TECHNIQUE: Multidetector CT imaging of the chest was performed using the standard protocol during bolus administration of intravenous contrast. Multiplanar CT image reconstructions and MIPs were obtained to evaluate the vascular anatomy. RADIATION DOSE REDUCTION: This exam was performed according to the departmental dose-optimization program which includes automated exposure control, adjustment of the mA and/or kV according to patient size and/or use of iterative reconstruction  technique. CONTRAST:  61m OMNIPAQUE IOHEXOL 350 MG/ML SOLN COMPARISON:  Chest x-ray 05/27/2021 FINDINGS: Cardiovascular: Satisfactory opacification of the pulmonary arteries to the segmental level. No evidence of pulmonary embolism. Mild aortic atherosclerosis. No aneurysm. Mild coronary vascular calcification. Mild cardiomegaly. No pericardial effusion Mediastinum/Nodes: Midline trachea. Homogeneously enlarged thyroid. No suspicious lymph nodes. Esophagus within normal limits. Lungs/Pleura: Lungs are clear. No pleural effusion or  pneumothorax. Upper Abdomen: Hyperdensity within the gallbladder with appearance not typical for stones. Musculoskeletal: No acute osseous abnormality Review of the MIP images confirms the above findings. IMPRESSION: 1. Negative for acute pulmonary embolus.  Clear lung fields. 2. Hyperdensity within the gallbladder with slightly atypical appearance for stone disease, question tumefactive sludge or mass. Suggest correlation with right upper quadrant ultrasound. Aortic Atherosclerosis (ICD10-I70.0). Electronically Signed   By: KDonavan FoilM.D.   On: 05/27/2021 20:08   CARDIAC CATHETERIZATION  Result Date: 05/28/2021 Images from the original result were not included. LM: Normal LAD: Mid 30% disease         Multiple diagonal branches supplying lateral wall         Diag 3, which actually comes up quite high off the LAD, with 50% ostial stenosis Lcx: No OM branches coming off proximal part of the vessel, possibly due to multiple high diagonals \        No definite evidence of an occluded proximal OM        Minimal luminal irregularities in prox LCx RCA: Dominant vessel. No significant disease LVEDP, LVEF normal ACS event possibly due to mild plaque erosion. ?Hypercoagulability in the setting of melanoma and immunotherapy. Recommend DAPT with Aspirin and plavix. This can be interrupted even before one year if surgical biopsy or procedure is required for melanoma. MNigel Mormon MD Pager: [phone]: [phone]  MR CARDIAC MORPHOLOGY W WO CONTRAST  Result Date: 05/29/2021 CLINICAL DATA:  Elevated Troponin r/o Myocarditis EXAM: CARDIAC MRI TECHNIQUE: The patient was scanned on a 1.5 Tesla Siemens magnet. A dedicated cardiac coil was used. Functional imaging was done using Fiesta sequences. 2,3, and 4 chamber views were done to assess for RWMA's. Modified Simpson's rule using a short axis stack was used to calculate an ejection fraction on a dedicated work sConservation officer, nature The patient  received 8 cc of Gadavist. After 10 minutes inversion recovery sequences were used to assess for infiltration and scar tissue. CONTRAST:  Gadavist FINDINGS: Normal atrial sizes. No ASD/PFO. Normal ascending aortic root 3.1 cm The aortic valve is tri leaflet There is MAC and prolapse of the posterior mitral leaflet with mild appearing MR There is normal RV size and function Overall LV EF preserved 56% (EDV 122 cc ESV 54 cc SV 68 cc ) No discrete RWMA Delayed gadolinium images showed focal uptake in the mid myocardium of the basal lateral wall and pericardium in this area T2 was elevated in this area 60.5 msec meeting LMinneapolisfor myocarditis in setting of heart cath with no epicardial cAD IMPRESSION: 1. Focal gadolinium uptake in basal inferior lateral wall with pericardial involvement Elevated T2 in this region consistent with myocarditis 2.  Overall normal EF with no defined RWMA EF 57% 3.  Posterior MV leaflet prolapse with mild appearing MR 4.  Normal AV and aortic root 3.1 cm 5.  Normal RV size and function 6.  No pericardial effusion PJenkins RougeElectronically Signed   By: PJenkins RougeM.D.   On: 05/29/2021 15:07   [place]Abdomen Limited RUQ (LIVER/GB)  Result Date: 05/29/2021 CLINICAL DATA:  Abnormality in the gallbladder on recent CT EXAM: ULTRASOUND ABDOMEN LIMITED RIGHT UPPER QUADRANT COMPARISON:  05/27/2021 FINDINGS: Gallbladder: Echogenic material layers dependently within the gallbladder lumen, with posterior acoustic shadowing, consistent with cholelithiasis. Largest calculus measures 10 mm. This corresponds to the CT findings. No gallbladder wall thickening. No pericholecystic fluid. Negative sonographic Murphy sign. Common bile duct: Diameter: 7 mm Liver: No focal lesion identified. Within normal limits in parenchymal echogenicity. Portal vein is patent on color Doppler imaging with normal direction of blood flow towards the liver. Other: None. IMPRESSION: 1. Multiple shadowing  gallstones within the gallbladder lumen. No evidence of acute cholecystitis. 2. Otherwise unremarkable exam. Electronically Signed   By: Randa Ngo M.D.   On: 05/29/2021 15:08   (Echo, Carotid, EGD, Colonoscopy, ERCP)    Subjective: Patient seen and examined.  In the early morning rounds patient reported no issues.  Denied any chest pains or shortness of breath.  She was eager to go home.  Examined her in the afternoon, son at the bedside.  Patient is up about and walking around and wants to go home.  She is aware that she has to finish more medication injections at [facility].   Discharge Exam: Vitals:   05/29/21 0408 05/29/21 0829  BP: 123/60 (!) 131/59  Pulse: 84 81  Resp: 12 16  Temp: 97.9 F (36.6 C)   SpO2: 96%    Vitals:   05/28/21 1932 05/29/21 0010 05/29/21 0408 05/29/21 0829  BP: 128/62 (!) 129/49 123/60 (!) 131/59  Pulse: 94 94 84 81  Resp: 16 (!) _0 Temp: 98.5 F (36.9 C) 98.4 F (36.9 C) 97.9 F (36.6 C)   TempSrc: Oral Oral Oral   SpO2: 96% 97% 96%   Weight:   80.8 kg   Height:        General: Pt is alert, awake, not in acute distress Cardiovascular: RRR, S1/S2 +, no rubs, no gallops Respiratory: CTA bilaterally, no wheezing, no rhonchi, Port-A-Cath present right chest wall. Abdominal: Soft, NT, ND, bowel sounds + Extremities: no edema, no cyanosis    The results of significant diagnostics from this hospitalization (including imaging, microbiology, ancillary and laboratory) are listed below for reference.     Microbiology: Recent Results (from the past 240 hour(s))  Resp Panel by RT-PCR (Flu A&B, Covid) Nasopharyngeal Swab     Status: None   Collection Time: 05/27/21  8:40 PM   Specimen: Nasopharyngeal Swab; Nasopharyngeal(NP) swabs in vial transport medium  Result Value Ref Range Status   SARS Coronavirus 2 by RT PCR NEGATIVE NEGATIVE Final    Comment: (NOTE) SARS-CoV-2 target nucleic acids are NOT DETECTED.  The SARS-CoV-2 RNA is  generally detectable in upper respiratory specimens during the acute phase of infection. The lowest concentration of SARS-CoV-2 viral copies this assay can detect is 138 copies/mL. A negative result does not preclude SARS-Cov-2 infection and should not be used as the sole basis for treatment or other patient management decisions. A negative result may occur with  improper specimen collection/handling, submission of specimen other than nasopharyngeal swab, presence of viral mutation(s) within the areas targeted by this assay, and inadequate number of viral copies(<138 copies/mL). A negative result must be combined with clinical observations, patient history, and epidemiological information. The expected result is Negative.  Fact Sheet for Patients:  EntrepreneurPulse.com.au  Fact Sheet for Healthcare Providers:  IncredibleEmployment.be  This test is no t yet approved or cleared by the [place] FDA  and  has been authorized for detection and/or diagnosis of SARS-CoV-2 by FDA under an Emergency Use Authorization (EUA). This EUA will remain  in effect (meaning this test can be used) for the duration of the COVID-19 declaration under Section 564(b)(1) of the Act, 21 U.S.C.section 360bbb-3(b)(1), unless the authorization is terminated  or revoked sooner.       Influenza A by PCR NEGATIVE NEGATIVE Final   Influenza B by PCR NEGATIVE NEGATIVE Final    Comment: (NOTE) The Xpert Xpress SARS-CoV-2/FLU/RSV plus assay is intended as an aid in the diagnosis of influenza from Nasopharyngeal swab specimens and should not be used as a sole basis for treatment. Nasal washings and aspirates are unacceptable for Xpert Xpress SARS-CoV-2/FLU/RSV testing.  Fact Sheet for Patients: EntrepreneurPulse.com.au  Fact Sheet for Healthcare Providers: IncredibleEmployment.be  This test is not yet approved or cleared by the [place] FDA and has been authorized for detection and/or diagnosis of SARS-CoV-2 by FDA under an Emergency Use Authorization (EUA). This EUA will remain in effect (meaning this test can be used) for the duration of the COVID-19 declaration under Section 564(b)(1) of the Act, 21 U.S.C. section 360bbb-3(b)(1), unless the authorization is terminated or revoked.  Performed at [facility], [address] [place], [place] [address]      Labs: BNP (last 3 results) Recent Labs    05/29/21 0248  BNP 580.9*   Basic Metabolic Panel: Recent Labs  Lab 05/27/21 1704 05/28/21 0419 05/28/21 2054 05/29/21 0248  NA 131* 130*  --  134*  K 3.3* 3.5  --  3.0*  CL 93* 94*  --  93*  CO2 28 32  --  30  GLUCOSE 147* 116*  --  109*  BUN 13 11  --  <5*  CREATININE 0.86 0.77 0.72 0.76  CALCIUM 9.1 8.4*  --  8.8*  MG  --   --   --  1.4*   Liver Function Tests: Recent Labs  Lab 05/29/21 0248  AST 61*  ALT 19  ALKPHOS 63  BILITOT 1.0  PROT 5.9*  ALBUMIN 2.8*   No results for input(s): LIPASE, AMYLASE in the last 168 hours. No results for input(s): AMMONIA in the last 168 hours. CBC: Recent Labs  Lab 05/27/21 1704 05/28/21 0419 05/28/21 2054 05/29/21 0248  WBC 5.1 6.1 6.3 6.1  HGB 13.1 11.1* 11.6* 11.8*  HCT 37.8 32.7* 32.9* 33.8*  MCV 90.9 90.8 86.8 88.3  PLT 213 172 177 173   Cardiac Enzymes: No results for input(s): CKTOTAL, CKMB, CKMBINDEX, TROPONINI in the last 168 hours. BNP: Invalid input(s): POCBNP CBG: No results for input(s): GLUCAP in the last 168 hours. D-Dimer No results for input(s): DDIMER in the last 72 hours. Hgb A1c Recent Labs    05/29/21 0248  HGBA1C 5.6   Lipid Profile Recent Labs    05/28/21 0419  CHOL 143  HDL 31*  LDLCALC 95  TRIG 86  CHOLHDL 4.6   Thyroid function studies No results for input(s): TSH, T4TOTAL, T3FREE, THYROIDAB in the last 72 hours.  Invalid input(s): FREET3 Anemia work up No results for input(s): VITAMINB12, FOLATE,  FERRITIN, TIBC, IRON, RETICCTPCT in the last 72 hours. Urinalysis No results found for: COLORURINE, APPEARANCEUR, [place], [place], GLUCOSEU, [place], [place], [place], PROTEINUR, UROBILINOGEN, NITRITE, LEUKOCYTESUR Sepsis Labs Invalid input(s): PROCALCITONIN,  WBC,  LACTICIDVEN Microbiology Recent Results (from the past 240 hour(s))  Resp Panel by RT-PCR (Flu A&B, Covid) Nasopharyngeal Swab     Status: None  Collection Time: 05/27/21  8:40 PM   Specimen: Nasopharyngeal Swab; Nasopharyngeal(NP) swabs in vial transport medium  Result Value Ref Range Status   SARS Coronavirus 2 by RT PCR NEGATIVE NEGATIVE Final    Comment: (NOTE) SARS-CoV-2 target nucleic acids are NOT DETECTED.  The SARS-CoV-2 RNA is generally detectable in upper respiratory specimens during the acute phase of infection. The lowest concentration of SARS-CoV-2 viral copies this assay can detect is 138 copies/mL. A negative result does not preclude SARS-Cov-2 infection and should not be used as the sole basis for treatment or other patient management decisions. A negative result may occur with  improper specimen collection/handling, submission of specimen other than nasopharyngeal swab, presence of viral mutation(s) within the areas targeted by this assay, and inadequate number of viral copies(<138 copies/mL). A negative result must be combined with clinical observations, patient history, and epidemiological information. The expected result is Negative.  Fact Sheet for Patients:  EntrepreneurPulse.com.au  Fact Sheet for Healthcare Providers:  IncredibleEmployment.be  This test is no t yet approved or cleared by the [place] FDA and  has been authorized for detection and/or diagnosis of SARS-CoV-2 by FDA under an Emergency Use Authorization (EUA). This EUA will remain  in effect (meaning this test can be used) for the duration of the COVID-19 declaration under Section  564(b)(1) of the Act, 21 U.S.C.section 360bbb-3(b)(1), unless the authorization is terminated  or revoked sooner.       Influenza A by PCR NEGATIVE NEGATIVE Final   Influenza B by PCR NEGATIVE NEGATIVE Final    Comment: (NOTE) The Xpert Xpress SARS-CoV-2/FLU/RSV plus assay is intended as an aid in the diagnosis of influenza from Nasopharyngeal swab specimens and should not be used as a sole basis for treatment. Nasal washings and aspirates are unacceptable for Xpert Xpress SARS-CoV-2/FLU/RSV testing.  Fact Sheet for Patients: EntrepreneurPulse.com.au  Fact Sheet for Healthcare Providers: IncredibleEmployment.be  This test is not yet approved or cleared by the [place] FDA and has been authorized for detection and/or diagnosis of SARS-CoV-2 by FDA under an Emergency Use Authorization (EUA). This EUA will remain in effect (meaning this test can be used) for the duration of the COVID-19 declaration under Section 564(b)(1) of the Act, 21 U.S.C. section 360bbb-3(b)(1), unless the authorization is terminated or revoked.  Performed at [facility], [address], [place], [place] [address]      Time coordinating discharge:  40 minutes  SIGNED:   Barb Merino, MD  [facility] 05/29/2021, 3:21 PM

## 2021-05-29 NOTE — Progress Notes (Unsigned)
Oncology Discharge Planning Note  [place] at [facility] Address: [address], [place] [address] Hours of Operation:  8am - 5pm, Monday - Friday  Clinic Contact Information:  [phone]  Oncology Care Team: Medical Oncologist:  Dr. Derek Jack  Patient Details: Name:  Elizabeth Norman, Elizabeth Norman MRN:   [id number] DOB:   10/06/1940 Reason for Current Admission: @PPROB @  Discharge Planning Narrative: Notification of admission received by Dr Delton Coombes for [facility].  Discharge follow-up appointments for oncology will be current  available on the AVS and MyChart.   Upon discharge from the hospital, hematology/oncology's post discharge plan of care for the outpatient setting is: Follow up in the office 1/19 for solumedrol infusion and ov with Dr Delton Coombes, along with a second solumedrol infusion on 1/20, unless patient is not discharged on 1/18, then will reassess plan.   [name] will be called within two business days after discharge to review hematology/oncology's plan of care for full understanding.    Outpatient Oncology Specific Care Only: Oncology appointment transportation needs addressed?:  {YES/NO/NOT APPLICABLE:20182} Oncology medication management for symptom management addressed?:  {YES/NO/NOT APPLICABLE:20182} Chemo Alert Card reviewed?:  {YES/NO/NOT APPLICABLE:20182} Immunotherapy Alert Card reviewed?:  {YES/NO/NOT APPLICABLE:20182}

## 2021-05-30 ENCOUNTER — Inpatient Hospital Stay (HOSPITAL_COMMUNITY): Payer: Medicare Other

## 2021-05-30 ENCOUNTER — Other Ambulatory Visit: Payer: Self-pay

## 2021-05-30 ENCOUNTER — Encounter: Payer: Self-pay | Admitting: *Deleted

## 2021-05-30 ENCOUNTER — Inpatient Hospital Stay (HOSPITAL_COMMUNITY): Payer: Medicare Other | Admitting: Hematology

## 2021-05-30 VITALS — BP 136/77 | HR 102 | Temp 97.0°F | Resp 19 | Ht 62.0 in | Wt 179.6 lb

## 2021-05-30 VITALS — BP 119/61

## 2021-05-30 DIAGNOSIS — Z800 Family history of malignant neoplasm of digestive organs: Secondary | ICD-10-CM | POA: Diagnosis not present

## 2021-05-30 DIAGNOSIS — I129 Hypertensive chronic kidney disease with stage 1 through stage 4 chronic kidney disease, or unspecified chronic kidney disease: Secondary | ICD-10-CM | POA: Diagnosis not present

## 2021-05-30 DIAGNOSIS — I408 Other acute myocarditis: Secondary | ICD-10-CM | POA: Diagnosis not present

## 2021-05-30 DIAGNOSIS — C7951 Secondary malignant neoplasm of bone: Secondary | ICD-10-CM | POA: Diagnosis not present

## 2021-05-30 DIAGNOSIS — C439 Malignant melanoma of skin, unspecified: Secondary | ICD-10-CM

## 2021-05-30 DIAGNOSIS — I514 Myocarditis, unspecified: Secondary | ICD-10-CM | POA: Diagnosis not present

## 2021-05-30 DIAGNOSIS — C4371 Malignant melanoma of right lower limb, including hip: Secondary | ICD-10-CM

## 2021-05-30 DIAGNOSIS — R2241 Localized swelling, mass and lump, right lower limb: Secondary | ICD-10-CM | POA: Diagnosis not present

## 2021-05-30 DIAGNOSIS — Z79899 Other long term (current) drug therapy: Secondary | ICD-10-CM | POA: Diagnosis not present

## 2021-05-30 DIAGNOSIS — N182 Chronic kidney disease, stage 2 (mild): Secondary | ICD-10-CM | POA: Diagnosis not present

## 2021-05-30 DIAGNOSIS — I409 Acute myocarditis, unspecified: Secondary | ICD-10-CM

## 2021-05-30 DIAGNOSIS — Z803 Family history of malignant neoplasm of breast: Secondary | ICD-10-CM | POA: Diagnosis not present

## 2021-05-30 DIAGNOSIS — E876 Hypokalemia: Secondary | ICD-10-CM | POA: Diagnosis not present

## 2021-05-30 LAB — CK: Total CK: 92 U/L (ref 38–234)

## 2021-05-30 LAB — TROPONIN I (HIGH SENSITIVITY): Troponin I (High Sensitivity): 3927 ng/L (ref <18)

## 2021-05-30 LAB — BRAIN NATRIURETIC PEPTIDE: B Natriuretic Peptide: 479 pg/mL — ABNORMAL HIGH (ref 0.0–100.0)

## 2021-05-30 MED ORDER — SODIUM CHLORIDE 0.9 % IV SOLN
1000.0000 mg | Freq: Once | INTRAVENOUS | Status: AC
  Administered 2021-05-30: 1000 mg via INTRAVENOUS
  Filled 2021-05-30: qty 1000

## 2021-05-30 MED ORDER — SODIUM CHLORIDE 0.9 % IV SOLN: Freq: Once | INTRAVENOUS | Status: AC

## 2021-05-30 MED ORDER — SODIUM CHLORIDE 0.9% FLUSH
10.0000 mL | Freq: Once | INTRAVENOUS | Status: AC
  Administered 2021-05-30: 10 mL via INTRAVENOUS

## 2021-05-30 MED ORDER — HEPARIN SOD (PORK) LOCK FLUSH 100 UNIT/ML IV SOLN
500.0000 [IU] | Freq: Once | INTRAVENOUS | Status: AC
  Administered 2021-05-30: 500 [IU] via INTRAVENOUS

## 2021-05-30 NOTE — Patient Instructions (Signed)
[facility]  Discharge Instructions: Thank you for choosing [facility] to provide your oncology and hematology care.  If you have a lab appointment with the Cancer Center, please come in thru the Main Entrance and check in at the main information desk.  Wear comfortable clothing and clothing appropriate for easy access to any Portacath or PICC line.   We strive to give you quality time with your provider. You may need to reschedule your appointment if you arrive late (15 or more minutes).  Arriving late affects you and other patients whose appointments are after yours.  Also, if you miss three or more appointments without notifying the office, you may be dismissed from the clinic at the provider's discretion.      For prescription refill requests, have your pharmacy contact our office and allow 72 hours for refills to be completed.        To help prevent nausea and vomiting after your treatment, we encourage you to take your nausea medication as directed.  BELOW ARE SYMPTOMS THAT SHOULD BE REPORTED IMMEDIATELY: *FEVER GREATER THAN 100.4 F (38 C) OR HIGHER *CHILLS OR SWEATING *NAUSEA AND VOMITING THAT IS NOT CONTROLLED WITH YOUR NAUSEA MEDICATION *UNUSUAL SHORTNESS OF BREATH *UNUSUAL BRUISING OR BLEEDING *URINARY PROBLEMS (pain or burning when urinating, or frequent urination) *BOWEL PROBLEMS (unusual diarrhea, constipation, pain near the anus) TENDERNESS IN MOUTH AND THROAT WITH OR WITHOUT PRESENCE OF ULCERS (sore throat, sores in mouth, or a toothache) UNUSUAL RASH, SWELLING OR PAIN  UNUSUAL VAGINAL DISCHARGE OR ITCHING   Items with * indicate a potential emergency and should be followed up as soon as possible or go to the Emergency Department if any problems should occur.  Please show the CHEMOTHERAPY ALERT CARD or IMMUNOTHERAPY ALERT CARD at check-in to the Emergency Department and triage nurse.  Should you have questions after your visit or need to cancel  or reschedule your appointment, please contact [facility] [phone]  and follow the prompts.  Office hours are 8:00 a.m. to 4:30 p.m. Monday - Friday. Please note that voicemails left after 4:00 p.m. may not be returned until the following business day.  We are closed weekends and major holidays. You have access to a nurse at all times for urgent questions. Please call the main number to the clinic [phone] and follow the prompts.  For any non-urgent questions, you may also contact your provider using MyChart. We now offer e-Visits for anyone 18 and older to request care online for non-urgent symptoms. For details visit [email or web address].   Also download the MyChart app! Go to the app store, search "MyChart", open the app, select [facility], and log in with your MyChart username and password.  Due to Covid, a mask is required upon entering the hospital/clinic. If you do not have a mask, one will be given to you upon arrival. For doctor visits, patients may have 1 support person aged 18 or older with them. For treatment visits, patients cannot have anyone with them due to current Covid guidelines and our immunocompromised population.  

## 2021-05-30 NOTE — Progress Notes (Unsigned)
Elizabeth Norman was not contacted by telephone as phone went to VM for discharge noted  05/30/21, however she has arrived for planned appointments this morning and will be evaluated in the [facility] with labs and solumedrol infusion.   Transportation to appointments were confirmed for the patient as being self/caregiver.  Elizabeth Norman questions were not addressed to their satisfaction upon completion of this post discharge follow-up call for outpatient oncology, due to the inability to reach patient by phone, however will get updated information at today's appointment.

## 2021-05-30 NOTE — Patient Instructions (Addendum)
[place] [facility] Discharge Instructions   You were seen and examined today by Dr. Delton Coombes.  He reviewed the results of your MRI - it shows inflammation of your heart (myocarditis).  We will give you high dose steroids today and tomorrow to help relieve this inflammation.  We will also check labs on your heart to make sure those are coming down.  They have been elevated due to the inflammation.  After the IV steroids, we will start you on steroid pills.  Return as scheduled.      Thank you for choosing [place] at [facility] to provide your oncology and hematology care.  To afford each patient quality time with our provider, please arrive at least 15 minutes before your scheduled appointment time.   If you have a lab appointment with the [facility] please come in thru the Main Entrance and check in at the main information desk.  You need to re-schedule your appointment should you arrive 10 or more minutes late.  We strive to give you quality time with our providers, and arriving late affects you and other patients whose appointments are after yours.  Also, if you no show three or more times for appointments you may be dismissed from the clinic at the providers discretion.     Again, thank you for choosing [facility].  Our hope is that these requests will decrease the amount of time that you wait before being seen by our physicians.       _____________________________________________________________  Should you have questions after your visit to [facility], please contact our office at [phone] and follow the prompts.  Our office hours are 8:00 a.m. and 4:30 p.m. Monday - Friday.  Please note that voicemails left after 4:00 p.m. may not be returned until the following business day.  We are closed weekends and major holidays.  You do have access to a nurse 24-7, just call the main number to the clinic  [phone] and do not press any options, hold on the line and a nurse will answer the phone.    For prescription refill requests, have your pharmacy contact our office and allow 72 hours.    Due to Covid, you will need to wear a mask upon entering the hospital. If you do not have a mask, a mask will be given to you at the Main Entrance upon arrival. For doctor visits, patients may have 1 support person age 81 or older with them. For treatment visits, patients can not have anyone with them due to social distancing guidelines and our immunocompromised population.

## 2021-05-30 NOTE — Progress Notes (Addendum)
Elizabeth Norman, [place] [address]   CLINIC:  Medical Oncology/Hematology  PCP:  Neale Burly, MD [place] / [place] [address] [phone]   REASON FOR VISIT:  Follow-up for metastatic malignant melanoma, BRAF V600 negative  PRIOR THERAPY: none  NGS Results: not done  CURRENT THERAPY: Opdualag (nivolumab/relatlimab) q28d  BRIEF ONCOLOGIC HISTORY:  Oncology History  Malignant melanoma of lower leg, right ([place])  03/27/2021 Initial Diagnosis   Malignant melanoma of lower leg, right ([place])   05/09/2021 -  Chemotherapy   Patient is on Treatment Plan : [place] (nivolumab/relatlimab) q28d       CANCER STAGING:  Cancer Staging  Malignant melanoma of lower leg, right ([place]) Staging form: Melanoma of the Skin, AJCC 8th Edition - Clinical stage from 03/27/2021: Stage IV (cT4a, cN3, cM1) - Unsigned   INTERVAL HISTORY:  [name], a 81 y.o. female, returns for routine follow-up of her metastatic malignant melanoma, BRAF V600 negative. Jaydence was last seen on 05/16/2021.   Today she reports feeling good. She denies any current pain. She denies current CP. She presented to the ED on 05/27/2021 for severe bone pain in her back and forearms bilaterally as well as SOB and CP. She also reports one day of diarrhea. She denies skin rash, dizziness, and palpitations. Her appetite is improving.   REVIEW OF SYSTEMS:  Review of Systems  Constitutional:  Negative for appetite change and fatigue.  Cardiovascular:  Negative for chest pain (resolved) and palpitations.  Gastrointestinal:  Positive for nausea. Negative for diarrhea (resolved).  Musculoskeletal:  Negative for arthralgias (resolved).  Skin:  Negative for rash.  Neurological:  Positive for headaches. Negative for dizziness.  Psychiatric/Behavioral:  Positive for depression and sleep disturbance. The patient is nervous/anxious.   All other systems reviewed and are  negative.  PAST MEDICAL/SURGICAL HISTORY:  Past Medical History:  Diagnosis Date   Arthritis    Hypertension    Malignant melanoma of lower leg, right ([place]) 03/27/2021   Port-A-Cath in place 04/30/2021   Past Surgical History:  Procedure Laterality Date   CATARACT EXTRACTION W/PHACO Right 10/03/2014   Procedure: CATARACT EXTRACTION PHACO AND INTRAOCULAR LENS PLACEMENT ([place]);  Surgeon: [name], MD;  Location: [facility];  Service: Ophthalmology;  Laterality: Right;  CDE:10.09   CATARACT EXTRACTION W/PHACO Left 10/10/2014   Procedure: CATARACT EXTRACTION PHACO AND INTRAOCULAR LENS PLACEMENT ([facility]);  Surgeon: [name], MD;  Location: [facility];  Service: Ophthalmology;  Laterality: Left;  CDE:8.69   CORNEAL 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   CORNEAL TRANSPLANT Left 2020   DENTAL RESTORATION/EXTRACTION WITH X-RAY     IR IMAGING GUIDED PORT INSERTION  04/05/2021   LEFT HEART CATH AND CORONARY ANGIOGRAPHY N/A 05/28/2021   Procedure: LEFT HEART CATH AND CORONARY ANGIOGRAPHY;  Surgeon: Nigel Mormon, MD;  Location: [facility];  Service: Cardiovascular;  Laterality: N/A;   MELANOMA EXCISION Right 02/12/2021   Procedure: WIDE LOCAL EXCISION RIGHT LOWER LEG MELANOMA , ADVANCEMENT FLAP CLOSURE DEFECT;  Surgeon: [name], MD;  Location: [facility];  Service: General;  Laterality: Right;   SENTINEL NODE BIOPSY Right 02/12/2021   Procedure: SENTINEL NODE BIOPSY;  Surgeon: [name], MD;  Location: [facility];  Service: General;  Laterality: Right;    SOCIAL HISTORY:  Social History   Socioeconomic History   Marital status: Widowed    Spouse name: Not on file   Number of children: Not on file  Years of education: Not on file   Highest education level: Not on file  Occupational History   Not on file  Tobacco Use   Smoking status: Never   Smokeless tobacco: Never  Vaping Use   Vaping Use: Never used  Substance and Sexual Activity   Alcohol use: No   Drug use: No   Sexual activity:  Not on file  Other Topics Concern   Not on file  Social History Narrative   Not on file   Social Determinants of Health   Financial Resource Strain: Not on file  Food Insecurity: Not on file  Transportation Needs: Not on file  Physical Activity: Not on file  Stress: Not on file  Social Connections: Not on file  Intimate Partner Violence: Not on file    FAMILY HISTORY:  No family history on file.  CURRENT MEDICATIONS:  Current Outpatient Medications  Medication Sig Dispense Refill   alendronate (FOSAMAX) 70 MG tablet Take 70 mg by mouth once a week.  0   ALPRAZolam (XANAX) 0.5 MG tablet Take 0.5 mg by mouth 3 (three) times daily.  2   aspirin EC 81 MG EC tablet Take 1 tablet (81 mg total) by mouth daily. Swallow whole. 30 tablet 11   atorvastatin (LIPITOR) 40 MG tablet Take 1 tablet (40 mg total) by mouth daily. 30 tablet 0   calcium carbonate (OS-CAL - DOSED IN MG OF ELEMENTAL CALCIUM) 1250 (500 CA) MG tablet Take 1 tablet by mouth 3 (three) times a week.     cholecalciferol (VITAMIN D) 1000 UNITS tablet Take 1,000 Units by mouth 3 (three) times a week.     Cyanocobalamin (VITAMIN B-12 PO) Take 1 tablet by mouth 3 (three) times a week.     lidocaine-prilocaine (EMLA) cream Apply a small amount to port a cath site and cover with plastic wrap 1 hour prior to port flush appointments 30 g 3   metoprolol tartrate (LOPRESSOR) 25 MG tablet Take 1 tablet (25 mg total) by mouth 2 (two) times daily. 60 tablet 0   Nivolumab-Relatlimab-rmbw (OPDUALAG) 240-80 MG/20ML SOLN Inject into the vein every 28 (twenty-eight) days.     prednisoLONE acetate (PRED FORTE) 1 % ophthalmic suspension Place 1 drop into both eyes See admin instructions. Instill one drop into the right eye twice daily and one drop into the left eye once daily.     No current facility-administered medications for this visit.    ALLERGIES:  Allergies  Allergen Reactions   Tape     Pulls skin, please use paper tape     PHYSICAL EXAM:  Performance status (ECOG): 1 - Symptomatic but completely ambulatory  Vitals:   05/30/21 0856  BP: 136/77  Pulse: (!) 102  Resp: 19  Temp: (!) 97 F (36.1 C)  SpO2: 95%   Wt Readings from Last 3 Encounters:  05/30/21 179 lb 9.6 oz (81.5 kg)  05/29/21 178 lb 1.6 oz (80.8 kg)  05/16/21 184 lb 12.8 oz (83.8 kg)   Physical Exam Vitals reviewed.  Constitutional:      Appearance: Normal appearance.  Cardiovascular:     Rate and Rhythm: Normal rate and regular rhythm.     Pulses: Normal pulses.     Heart sounds: Normal heart sounds.  Pulmonary:     Effort: Pulmonary effort is normal.     Breath sounds: Normal breath sounds.  Musculoskeletal:     Right lower leg: No edema.     Left lower leg: No edema.  Neurological:     General: No focal deficit present.     Mental Status: She is alert and oriented to person, place, and time.  Psychiatric:        Mood and Affect: Mood normal.        Behavior: Behavior normal.     LABORATORY DATA:  I have reviewed the labs as listed.  CBC Latest Ref Rng & Units 05/29/2021 05/28/2021 05/28/2021  WBC 4.0 - 10.5 K/uL 6.1 6.3 6.1  Hemoglobin 12.0 - 15.0 g/dL 11.8(L) 11.6(L) 11.1(L)  Hematocrit 36.0 - 46.0 % 33.8(L) 32.9(L) 32.7(L)  Platelets 150 - 400 K/uL 173 177 172   CMP Latest Ref Rng & Units 05/29/2021 05/28/2021 05/28/2021  Glucose 70 - 99 mg/dL 109(H) - 116(H)  BUN 8 - 23 mg/dL <5(L) - 11  Creatinine 0.44 - 1.00 mg/dL 0.76 0.72 0.77  Sodium 135 - 145 mmol/L 134(L) - 130(L)  Potassium 3.5 - 5.1 mmol/L 3.0(L) - 3.5  Chloride 98 - 111 mmol/L 93(L) - 94(L)  CO2 22 - 32 mmol/L 30 - 32  Calcium 8.9 - 10.3 mg/dL 8.8(L) - 8.4(L)  Total Protein 6.5 - 8.1 g/dL 5.9(L) - -  Total Bilirubin 0.3 - 1.2 mg/dL 1.0 - -  Alkaline Phos 38 - 126 U/L 63 - -  AST 15 - 41 U/L 61(H) - -  ALT 0 - 44 U/L 19 - -    DIAGNOSTIC IMAGING:  I have independently reviewed the scans and discussed with the patient. DG Chest 2 View  Result  Date: 05/27/2021 CLINICAL DATA:  Chest pain EXAM: CHEST - 2 VIEW COMPARISON:  None. FINDINGS: The heart size and mediastinal contours are within normal limits. Right central venous line is identified with distal tip in the superior vena cava. Both lungs are clear. Degenerative joint changes of the spine noted. IMPRESSION: No active cardiopulmonary disease. Electronically Signed   By: Abelardo Diesel M.D.   On: 05/27/2021 17:22   CT Head Wo Contrast  Result Date: 05/27/2021 CLINICAL DATA:  History of melanoma EXAM: CT HEAD WITHOUT CONTRAST TECHNIQUE: Contiguous axial images were obtained from the base of the skull through the vertex without intravenous contrast. RADIATION DOSE REDUCTION: This exam was performed according to the departmental dose-optimization program which includes automated exposure control, adjustment of the mA and/or kV according to patient size and/or use of iterative reconstruction technique. COMPARISON:  None. FINDINGS: Brain: No acute territorial infarction, hemorrhage or intracranial mass. Mild atrophy. Minimal chronic small vessel ischemic changes of the white matter. Ventricles are nonenlarged Vascular: No hyperdense vessels.  No unexpected calcification Skull: Normal. Negative for fracture or focal lesion. Sinuses/Orbits: No acute finding. Other: None IMPRESSION: 1. No CT evidence for acute intracranial abnormality. 2. Mild atrophy Electronically Signed   By: Donavan Foil M.D.   On: 05/27/2021 20:10   CT Angio Chest PE W and/or Wo Contrast  Result Date: 05/27/2021 CLINICAL DATA:  Chest pain EXAM: CT ANGIOGRAPHY CHEST WITH CONTRAST TECHNIQUE: Multidetector CT imaging of the chest was performed using the standard protocol during bolus administration of intravenous contrast. Multiplanar CT image reconstructions and MIPs were obtained to evaluate the vascular anatomy. RADIATION DOSE REDUCTION: This exam was performed according to the departmental dose-optimization program which includes  automated exposure control, adjustment of the mA and/or kV according to patient size and/or use of iterative reconstruction technique. CONTRAST:  49m OMNIPAQUE IOHEXOL 350 MG/ML SOLN COMPARISON:  Chest x-ray 05/27/2021 FINDINGS: Cardiovascular: Satisfactory opacification of the pulmonary arteries to the segmental  level. No evidence of pulmonary embolism. Mild aortic atherosclerosis. No aneurysm. Mild coronary vascular calcification. Mild cardiomegaly. No pericardial effusion Mediastinum/Nodes: Midline trachea. Homogeneously enlarged thyroid. No suspicious lymph nodes. Esophagus within normal limits. Lungs/Pleura: Lungs are clear. No pleural effusion or pneumothorax. Upper Abdomen: Hyperdensity within the gallbladder with appearance not typical for stones. Musculoskeletal: No acute osseous abnormality Review of the MIP images confirms the above findings. IMPRESSION: 1. Negative for acute pulmonary embolus.  Clear lung fields. 2. Hyperdensity within the gallbladder with slightly atypical appearance for stone disease, question tumefactive sludge or mass. Suggest correlation with right upper quadrant ultrasound. Aortic Atherosclerosis (ICD10-I70.0). Electronically Signed   By: Donavan Foil M.D.   On: 05/27/2021 20:08   CARDIAC CATHETERIZATION  Result Date: 05/28/2021 Images from the original result were not included. LM: Normal LAD: Mid 30% disease         Multiple diagonal branches supplying lateral wall         Diag 3, which actually comes up quite high off the LAD, with 50% ostial stenosis Lcx: No OM branches coming off proximal part of the vessel, possibly due to multiple high diagonals \        No definite evidence of an occluded proximal OM        Minimal luminal irregularities in prox LCx RCA: Dominant vessel. No significant disease LVEDP, LVEF normal ACS event possibly due to mild plaque erosion. ?Hypercoagulability in the setting of melanoma and immunotherapy. Recommend DAPT with Aspirin and plavix. This  can be interrupted even before one year if surgical biopsy or procedure is required for melanoma. Nigel Mormon, MD Pager: [phone] Office: [phone]   MR CARDIAC MORPHOLOGY W WO CONTRAST  Result Date: 05/29/2021 CLINICAL DATA:  Elevated Troponin r/o Myocarditis EXAM: CARDIAC MRI TECHNIQUE: The patient was scanned on a 1.5 Tesla Siemens magnet. A dedicated cardiac coil was used. Functional imaging was done using Fiesta sequences. 2,3, and 4 chamber views were done to assess for RWMA's. Modified Simpson's rule using a short axis stack was used to calculate an ejection fraction on a dedicated work Conservation officer, nature. The patient received 8 cc of Gadavist. After 10 minutes inversion recovery sequences were used to assess for infiltration and scar tissue. CONTRAST:  Gadavist FINDINGS: Normal atrial sizes. No ASD/PFO. Normal ascending aortic root 3.1 cm The aortic valve is tri leaflet There is MAC and prolapse of the posterior mitral leaflet with mild appearing MR There is normal RV size and function Overall LV EF preserved 56% (EDV 122 cc ESV 54 cc SV 68 cc ) No discrete RWMA Delayed gadolinium images showed focal uptake in the mid myocardium of the basal lateral wall and pericardium in this area T2 was elevated in this area 60.5 msec meeting [place] for myocarditis in setting of heart cath with no epicardial cAD IMPRESSION: 1. Focal gadolinium uptake in basal inferior lateral wall with pericardial involvement Elevated T2 in this region consistent with myocarditis 2.  Overall normal EF with no defined RWMA EF 57% 3.  Posterior MV leaflet prolapse with mild appearing MR 4.  Normal AV and aortic root 3.1 cm 5.  Normal RV size and function 6.  No pericardial effusion [place] Rouge Electronically Signed   By: Jenkins Rouge M.D.   On: 05/29/2021 15:07   US Abdomen Limited RUQ (LIVER/GB)  Result Date: 05/29/2021 CLINICAL DATA:  Abnormality in the gallbladder on recent CT EXAM:  ULTRASOUND ABDOMEN LIMITED RIGHT UPPER QUADRANT COMPARISON:  05/27/2021 FINDINGS:  Gallbladder: Echogenic material layers dependently within the gallbladder lumen, with posterior acoustic shadowing, consistent with cholelithiasis. Largest calculus measures 10 mm. This corresponds to the CT findings. No gallbladder wall thickening. No pericholecystic fluid. Negative sonographic Murphy sign. Common bile duct: Diameter: 7 mm Liver: No focal lesion identified. Within normal limits in parenchymal echogenicity. Portal vein is patent on color Doppler imaging with normal direction of blood flow towards the liver. Other: None. IMPRESSION: 1. Multiple shadowing gallstones within the gallbladder lumen. No evidence of acute cholecystitis. 2. Otherwise unremarkable exam. Electronically Signed   By: Randa Ngo M.D.   On: 05/29/2021 15:08     ASSESSMENT:  Malignant melanoma of right posterior leg (pT4 pN3 M1): - She noticed fleshy lesion 4 months ago on the right leg. - Her PMD did a biopsy which was consistent with melanoma. - Wide local excision and lymph node biopsy by Dr. Barry Dienes on 02/12/2021. - Pathology margins free, nodular type, Breslow's thickness 9.52 mm, Clark level V, deep margins free, ulceration absent, satellitosis absent, mitotic index 2/millimeters squared, LVI negative.  Neurotropism absent.  Tumor infiltrating lymphocytes absent.  Tumor regression not noted.  3 out of 3 positive sentinel lymph nodes for melanoma. - PET CT scan on 03/22/2021 with 3 foci of intense radiotracer activity within the right lower extremity.  In the medial right upper thigh nodule just superficial to thigh musculature measuring 11 mm with SUV 7.6.  Intramedullary hypermetabolic activity within the shaft of the mid right femur with SUV 14.6.  Intense activity associated with condylar notch of the distal right femur, appears to localize to soft tissue rather than the bone.  More diffuse activity associated with the medial right  calf related to excision biopsy.  There is a focus of activity adjacent to the lateral margin of the right iliac wing SUV 4.1.  No evidence of visceral metastatic disease in the chest, abdomen or pelvis. 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 - NGS testing-BRAF negative, positive for NRAS pathogenic variant exon 3, TERT promoter.  MSI-stable.  MMR-proficient.  [place] 1/2/3 fusion not detected.  KIT mutation negative.  PD-L1 negative. - Nivolumab-Relatlimab every 28 days started on 05/09/2021.    Social/family history: - She is seen with her son today.  She lives by herself at home.  She is independent of ADLs and IADLs.  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  She is non-smoker. - Father had colon cancer.  Sister had breast cancer and colon cancer.  Maternal aunt had breast cancer.  Paternal uncle had colon cancer.   PLAN:  Malignant melanoma of the right posterior leg (pT4 pN3 M1), BRAF V600 negative: - She has received nivolumab-relatlimab combination on 05/09/2021 as first dose. - On 05/27/2021, she developed pains in her arms, bone pains and back pain.  She had diarrhea for 1 day but denied any rashes.  She came to the ED ER and was also found to have chest pain. - Troponins were elevated at 200 range. - She was evaluated by cardiology and cardiac catheterization did not show any blockages. - Subsequent MRI showed myocarditis. - She was started on methylprednisone 1 g, first dose on 05/29/2021.  She tolerated it very well. - Today she does not report any chest pains. - We have repeated her troponin I which  improved to 3927 from 12,617 (05/28/2021). - BNP has improved to 479 from 516.  No clinical signs of CHF. - She was started on metoprolol 25 mg twice daily, aspirin 81 mg and Lipitor  40 mg daily. - We have also done twelve-lead EKG in the office today and reviewed it.  No significant changes from previous EKG. - We had a prolonged discussion about this rare unfortunate side effect from combination immunotherapy which can sometimes be fatal causing CHF and arrhythmias.  Fortunately she does not have any of those at this time. - She will proceed with methylprednisone 1 g day 2.  We will reevaluate her tomorrow with repeat troponin and BNP levels.  No evidence of myasthenia gravis at this time.  We will plan to switch her to oral steroid starting Saturday.  2.  Mild CKD: - She has mild CKD with creatinine ranging around 1.0.  Latest creatinine has improved to 0.6.  3.  Bone disease: - She has a solitary osseous metastasis posteriorly in the distal right femoral diaphysis. - If there is any worsening, will also consider bisphosphonates.   Orders placed this encounter:  No orders of the defined types were placed in this encounter.    Derek Jack, MD [place] [phone]   I, Thana Ates, am acting as a scribe for Dr. Derek Jack.  I, Derek Jack MD, have reviewed the above documentation for accuracy and completeness, and I agree with the above.

## 2021-05-30 NOTE — Progress Notes (Signed)
CRITICAL VALUE ALERT Critical value received:  Tropin 3,927 Date of notification:  05-30-2021 Time of notification: 09:50 am Critical value read back:  Yes.   Nurse who received alert:  B Avonne Berkery RN  MD notified time and response:  Dr. Delton Coombes / Lora Havens RN. Patient is being seen in clinic by Dr. Delton Coombes.

## 2021-05-30 NOTE — Progress Notes (Signed)
Attempted to contact patient and all 3 of her sons this am to verify that they were aware of appointments in the office at [facility] this morning, starting at 815.  Unable to reach anyone to discuss.  Dr Delton Coombes made aware.

## 2021-05-30 NOTE — Progress Notes (Signed)
IV solumedrol infusion given per orders. Patient tolerated it well without problems. Vitals stable and discharged home from clinic ambulatory. Follow up as scheduled.

## 2021-05-31 ENCOUNTER — Inpatient Hospital Stay (HOSPITAL_COMMUNITY): Payer: Medicare Other

## 2021-05-31 ENCOUNTER — Telehealth: Payer: Self-pay

## 2021-05-31 ENCOUNTER — Other Ambulatory Visit: Payer: Self-pay

## 2021-05-31 ENCOUNTER — Other Ambulatory Visit (HOSPITAL_COMMUNITY): Payer: Medicare Other

## 2021-05-31 ENCOUNTER — Inpatient Hospital Stay (HOSPITAL_COMMUNITY): Payer: Medicare Other | Admitting: Hematology

## 2021-05-31 VITALS — BP 135/70 | HR 82 | Temp 97.5°F | Resp 18

## 2021-05-31 DIAGNOSIS — I514 Myocarditis, unspecified: Secondary | ICD-10-CM | POA: Diagnosis not present

## 2021-05-31 DIAGNOSIS — C4371 Malignant melanoma of right lower limb, including hip: Secondary | ICD-10-CM

## 2021-05-31 DIAGNOSIS — R2241 Localized swelling, mass and lump, right lower limb: Secondary | ICD-10-CM | POA: Diagnosis not present

## 2021-05-31 DIAGNOSIS — E876 Hypokalemia: Secondary | ICD-10-CM | POA: Diagnosis not present

## 2021-05-31 DIAGNOSIS — Z803 Family history of malignant neoplasm of breast: Secondary | ICD-10-CM | POA: Diagnosis not present

## 2021-05-31 DIAGNOSIS — I408 Other acute myocarditis: Secondary | ICD-10-CM

## 2021-05-31 DIAGNOSIS — I214 Non-ST elevation (NSTEMI) myocardial infarction: Secondary | ICD-10-CM | POA: Diagnosis not present

## 2021-05-31 DIAGNOSIS — I409 Acute myocarditis, unspecified: Secondary | ICD-10-CM

## 2021-05-31 DIAGNOSIS — C7951 Secondary malignant neoplasm of bone: Secondary | ICD-10-CM | POA: Diagnosis not present

## 2021-05-31 DIAGNOSIS — Z79899 Other long term (current) drug therapy: Secondary | ICD-10-CM | POA: Diagnosis not present

## 2021-05-31 DIAGNOSIS — N182 Chronic kidney disease, stage 2 (mild): Secondary | ICD-10-CM | POA: Diagnosis not present

## 2021-05-31 DIAGNOSIS — Z800 Family history of malignant neoplasm of digestive organs: Secondary | ICD-10-CM | POA: Diagnosis not present

## 2021-05-31 DIAGNOSIS — I129 Hypertensive chronic kidney disease with stage 1 through stage 4 chronic kidney disease, or unspecified chronic kidney disease: Secondary | ICD-10-CM | POA: Diagnosis not present

## 2021-05-31 LAB — CK: Total CK: 141 U/L (ref 38–234)

## 2021-05-31 LAB — TROPONIN I (HIGH SENSITIVITY): Troponin I (High Sensitivity): 3238 ng/L (ref <18)

## 2021-05-31 LAB — BASIC METABOLIC PANEL
Anion gap: 12 (ref 5–15)
BUN: 19 mg/dL (ref 8–23)
CO2: 28 mmol/L (ref 22–32)
Calcium: 8.6 mg/dL — ABNORMAL LOW (ref 8.9–10.3)
Chloride: 94 mmol/L — ABNORMAL LOW (ref 98–111)
Creatinine, Ser: 0.99 mg/dL (ref 0.44–1.00)
GFR, Estimated: 58 mL/min — ABNORMAL LOW (ref >60)
Glucose, Bld: 169 mg/dL — ABNORMAL HIGH (ref 70–99)
Potassium: 3.6 mmol/L (ref 3.5–5.1)
Sodium: 134 mmol/L — ABNORMAL LOW (ref 135–145)

## 2021-05-31 LAB — BRAIN NATRIURETIC PEPTIDE: B Natriuretic Peptide: 489 pg/mL — ABNORMAL HIGH (ref 0.0–100.0)

## 2021-05-31 LAB — TSH: TSH: 0.024 u[IU]/mL — ABNORMAL LOW (ref 0.350–4.500)

## 2021-05-31 LAB — T4, FREE: Free T4: 0.78 ng/dL (ref 0.61–1.12)

## 2021-05-31 LAB — MAGNESIUM: Magnesium: 1.9 mg/dL (ref 1.7–2.4)

## 2021-05-31 MED ORDER — SODIUM CHLORIDE 0.9% FLUSH
10.0000 mL | INTRAVENOUS | Status: DC | PRN
  Administered 2021-05-31: 10 mL via INTRAVENOUS

## 2021-05-31 MED ORDER — HEPARIN SOD (PORK) LOCK FLUSH 100 UNIT/ML IV SOLN
500.0000 [IU] | Freq: Once | INTRAVENOUS | Status: AC
  Administered 2021-05-31: 500 [IU] via INTRAVENOUS

## 2021-05-31 MED ORDER — SODIUM CHLORIDE 0.9 % IV SOLN: Freq: Once | INTRAVENOUS | Status: AC

## 2021-05-31 MED ORDER — PREDNISONE 20 MG PO TABS: 60.0000 mg | ORAL_TABLET | Freq: Every day | ORAL | 0 refills | Status: DC

## 2021-05-31 MED ORDER — SODIUM CHLORIDE 0.9 % IV SOLN
1000.0000 mg | Freq: Once | INTRAVENOUS | Status: AC
  Administered 2021-05-31: 1000 mg via INTRAVENOUS
  Filled 2021-05-31: qty 1000

## 2021-05-31 NOTE — Progress Notes (Signed)
Patient presents today for Solu-medrol infusion.  Patient is in satisfactory condition with no complaints voiced.  Vital signs are stable.  Labs reviewed by Dr. Delton Coombes during her office visit.  We will proceed with treatment per MD orders.   Patient tolerated treatment well with no complaints voiced.  Patient left ambulatory in stable condition.  Vital signs stable at discharge.  Follow up as scheduled.

## 2021-05-31 NOTE — Progress Notes (Signed)
[place], [place] [address]   CLINIC:  Medical Oncology/Hematology  PCP:  Neale Burly, MD [place] / [place] [address] [phone]   REASON FOR VISIT:  Follow-up for metastatic malignant melanoma, BRAF V600 negative  PRIOR THERAPY: none  NGS Results: not done  CURRENT THERAPY: Opdualag (nivolumab/relatlimab) q28d  BRIEF ONCOLOGIC HISTORY:  Oncology History  Malignant melanoma of lower leg, right ([place])  03/27/2021 Initial Diagnosis   Malignant melanoma of lower leg, right ([place])   05/09/2021 -  Chemotherapy   Patient is on Treatment Plan : [place] (nivolumab/relatlimab) q28d       CANCER STAGING:  Cancer Staging  Malignant melanoma of lower leg, right ([place]) Staging form: Melanoma of the Skin, AJCC 8th Edition - Clinical stage from 03/27/2021: Stage IV (cT4a, cN3, cM1) - Unsigned   INTERVAL HISTORY:  [name], a 81 y.o. female, returns for routine follow-up of her metastatic malignant melanoma, BRAF V600 negative. Tiffiany was last seen on 05/30/2021.   Today she reports feeling good. She denies palpitations, SOB, and orthopnea. She reports good sleep and good appetite. She denies pain in her arms and leg swellings. She denies previously taking prednisone.   REVIEW OF SYSTEMS:  Review of Systems  Constitutional:  Negative for appetite change and fatigue.  Respiratory:  Negative for shortness of breath.   Cardiovascular:  Negative for leg swelling and palpitations.  Psychiatric/Behavioral:  Negative for sleep disturbance.   All other systems reviewed and are negative.  PAST MEDICAL/SURGICAL HISTORY:  Past Medical History:  Diagnosis Date   Arthritis    Hypertension    Malignant melanoma of lower leg, right ([place]) 03/27/2021   Port-A-Cath in place 04/30/2021   Past Surgical History:  Procedure Laterality Date   CATARACT EXTRACTION W/PHACO Right 10/03/2014   Procedure: CATARACT EXTRACTION  PHACO AND INTRAOCULAR LENS PLACEMENT ([place]);  Surgeon: [name], MD;  Location: [facility];  Service: Ophthalmology;  Laterality: Right;  CDE:10.09   CATARACT EXTRACTION W/PHACO Left 10/10/2014   Procedure: CATARACT EXTRACTION PHACO AND INTRAOCULAR LENS PLACEMENT ([facility]);  Surgeon: [name], MD;  Location: [facility];  Service: Ophthalmology;  Laterality: Left;  CDE:8.69   CORNEAL 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   CORNEAL TRANSPLANT Left 2020   DENTAL RESTORATION/EXTRACTION WITH X-RAY     IR IMAGING GUIDED PORT INSERTION  04/05/2021   LEFT HEART CATH AND CORONARY ANGIOGRAPHY N/A 05/28/2021   Procedure: LEFT HEART CATH AND CORONARY ANGIOGRAPHY;  Surgeon: Nigel Mormon, MD;  Location: [facility];  Service: Cardiovascular;  Laterality: N/A;   MELANOMA EXCISION Right 02/12/2021   Procedure: WIDE LOCAL EXCISION RIGHT LOWER LEG MELANOMA , ADVANCEMENT FLAP CLOSURE DEFECT;  Surgeon: [name], MD;  Location: [facility];  Service: General;  Laterality: Right;   SENTINEL NODE BIOPSY Right 02/12/2021   Procedure: SENTINEL NODE BIOPSY;  Surgeon: [name], MD;  Location: [facility];  Service: General;  Laterality: Right;    SOCIAL HISTORY:  Social History   Socioeconomic History   Marital status: Widowed    Spouse name: Not on file   Number of children: Not on file   Years of education: Not on file   Highest education level: Not on file  Occupational History   Not on file  Tobacco Use   Smoking status: Never   Smokeless tobacco: Never  Vaping Use   Vaping Use: Never used  Substance and Sexual Activity   Alcohol  use: No   Drug use: No   Sexual activity: Not on file  Other Topics Concern   Not on file  Social History Narrative   Not on file   Social Determinants of Health   Financial Resource Strain: Not on file  Food Insecurity: Not on file  Transportation Needs: Not on file  Physical Activity: Not on file  Stress: Not on file  Social Connections: Not on file  Intimate Partner  Violence: Not on file    FAMILY HISTORY:  No family history on file.  CURRENT MEDICATIONS:  Current Outpatient Medications  Medication Sig Dispense Refill   alendronate (FOSAMAX) 70 MG tablet Take 70 mg by mouth once a week.  0   ALPRAZolam (XANAX) 0.5 MG tablet Take 0.5 mg by mouth 3 (three) times daily.  2   aspirin EC 81 MG EC tablet Take 1 tablet (81 mg total) by mouth daily. Swallow whole. 30 tablet 11   atorvastatin (LIPITOR) 40 MG tablet Take 1 tablet (40 mg total) by mouth daily. 30 tablet 0   calcium carbonate (OS-CAL - DOSED IN MG OF ELEMENTAL CALCIUM) 1250 (500 CA) MG tablet Take 1 tablet by mouth 3 (three) times a week.     cholecalciferol (VITAMIN D) 1000 UNITS tablet Take 1,000 Units by mouth 3 (three) times a week.     Cyanocobalamin (VITAMIN B-12 PO) Take 1 tablet by mouth 3 (three) times a week.     lidocaine-prilocaine (EMLA) cream Apply a small amount to port a cath site and cover with plastic wrap 1 hour prior to port flush appointments 30 g 3   metoprolol tartrate (LOPRESSOR) 25 MG tablet Take 1 tablet (25 mg total) by mouth 2 (two) times daily. 60 tablet 0   prednisoLONE acetate (PRED FORTE) 1 % ophthalmic suspension Place 1 drop into both eyes See admin instructions. Instill one drop into the right eye twice daily and one drop into the left eye once daily.     predniSONE (DELTASONE) 20 MG tablet Take 3 tablets (60 mg total) by mouth daily with breakfast. 90 tablet 0   No current facility-administered medications for this visit.    ALLERGIES:  Allergies  Allergen Reactions   Tape     Pulls skin, please use paper tape    PHYSICAL EXAM:  Performance status (ECOG): 1 - Symptomatic but completely ambulatory  There were no vitals filed for this visit. Wt Readings from Last 3 Encounters:  05/31/21 181 lb 6.4 oz (82.3 kg)  05/30/21 179 lb 9.6 oz (81.5 kg)  05/29/21 178 lb 1.6 oz (80.8 kg)   Physical Exam Vitals reviewed.  Constitutional:      Appearance:  Normal appearance. She is obese.  Cardiovascular:     Rate and Rhythm: Normal rate and regular rhythm.     Pulses: Normal pulses.     Heart sounds: Normal heart sounds.  Pulmonary:     Effort: Pulmonary effort is normal.     Breath sounds: Normal breath sounds.  Musculoskeletal:     Right lower leg: No edema.     Left lower leg: No edema.  Neurological:     General: No focal deficit present.     Mental Status: She is alert and oriented to person, place, and time.  Psychiatric:        Mood and Affect: Mood normal.        Behavior: Behavior normal.     LABORATORY DATA:  I have reviewed the labs as  listed.  CBC Latest Ref Rng & Units 05/29/2021 05/28/2021 05/28/2021  WBC 4.0 - 10.5 K/uL 6.1 6.3 6.1  Hemoglobin 12.0 - 15.0 g/dL 11.8(L) 11.6(L) 11.1(L)  Hematocrit 36.0 - 46.0 % 33.8(L) 32.9(L) 32.7(L)  Platelets 150 - 400 K/uL 173 177 172   CMP Latest Ref Rng & Units 05/31/2021 05/29/2021 05/28/2021  Glucose 70 - 99 mg/dL 169(H) 109(H) -  BUN 8 - 23 mg/dL 19 <5(L) -  Creatinine 0.44 - 1.00 mg/dL 0.99 0.76 0.72  Sodium 135 - 145 mmol/L 134(L) 134(L) -  Potassium 3.5 - 5.1 mmol/L 3.6 3.0(L) -  Chloride 98 - 111 mmol/L 94(L) 93(L) -  CO2 22 - 32 mmol/L 28 30 -  Calcium 8.9 - 10.3 mg/dL 8.6(L) 8.8(L) -  Total Protein 6.5 - 8.1 g/dL - 5.9(L) -  Total Bilirubin 0.3 - 1.2 mg/dL - 1.0 -  Alkaline Phos 38 - 126 U/L - 63 -  AST 15 - 41 U/L - 61(H) -  ALT 0 - 44 U/L - 19 -    DIAGNOSTIC IMAGING:  I have independently reviewed the scans and discussed with the patient. DG Chest 2 View  Result Date: 05/27/2021 CLINICAL DATA:  Chest pain EXAM: CHEST - 2 VIEW COMPARISON:  None. FINDINGS: The heart size and mediastinal contours are within normal limits. Right central venous line is identified with distal tip in the superior vena cava. Both lungs are clear. Degenerative joint changes of the spine noted. IMPRESSION: No active cardiopulmonary disease. Electronically Signed   By: Abelardo Diesel M.D.    On: 05/27/2021 17:22   CT Head Wo Contrast  Result Date: 05/27/2021 CLINICAL DATA:  History of melanoma EXAM: CT HEAD WITHOUT CONTRAST TECHNIQUE: Contiguous axial images were obtained from the base of the skull through the vertex without intravenous contrast. RADIATION DOSE REDUCTION: This exam was performed according to the departmental dose-optimization program which includes automated exposure control, adjustment of the mA and/or kV according to patient size and/or use of iterative reconstruction technique. COMPARISON:  None. FINDINGS: Brain: No acute territorial infarction, hemorrhage or intracranial mass. Mild atrophy. Minimal chronic small vessel ischemic changes of the white matter. Ventricles are nonenlarged Vascular: No hyperdense vessels.  No unexpected calcification Skull: Normal. Negative for fracture or focal lesion. Sinuses/Orbits: No acute finding. Other: None IMPRESSION: 1. No CT evidence for acute intracranial abnormality. 2. Mild atrophy Electronically Signed   By: Donavan Foil M.D.   On: 05/27/2021 20:10   CT Angio Chest PE W and/or Wo Contrast  Result Date: 05/27/2021 CLINICAL DATA:  Chest pain EXAM: CT ANGIOGRAPHY CHEST WITH CONTRAST TECHNIQUE: Multidetector CT imaging of the chest was performed using the standard protocol during bolus administration of intravenous contrast. Multiplanar CT image reconstructions and MIPs were obtained to evaluate the vascular anatomy. RADIATION DOSE REDUCTION: This exam was performed according to the departmental dose-optimization program which includes automated exposure control, adjustment of the mA and/or kV according to patient size and/or use of iterative reconstruction technique. CONTRAST:  43m OMNIPAQUE IOHEXOL 350 MG/ML SOLN COMPARISON:  Chest x-ray 05/27/2021 FINDINGS: Cardiovascular: Satisfactory opacification of the pulmonary arteries to the segmental level. No evidence of pulmonary embolism. Mild aortic atherosclerosis. No aneurysm. Mild  coronary vascular calcification. Mild cardiomegaly. No pericardial effusion Mediastinum/Nodes: Midline trachea. Homogeneously enlarged thyroid. No suspicious lymph nodes. Esophagus within normal limits. Lungs/Pleura: Lungs are clear. No pleural effusion or pneumothorax. Upper Abdomen: Hyperdensity within the gallbladder with appearance not typical for stones. Musculoskeletal: No acute osseous abnormality Review of the  MIP images confirms the above findings. IMPRESSION: 1. Negative for acute pulmonary embolus.  Clear lung fields. 2. Hyperdensity within the gallbladder with slightly atypical appearance for stone disease, question tumefactive sludge or mass. Suggest correlation with right upper quadrant ultrasound. Aortic Atherosclerosis (ICD10-I70.0). Electronically Signed   By: Donavan Foil M.D.   On: 05/27/2021 20:08   CARDIAC CATHETERIZATION  Result Date: 05/28/2021 Images from the original result were not included. LM: Normal LAD: Mid 30% disease         Multiple diagonal branches supplying lateral wall         Diag 3, which actually comes up quite high off the LAD, with 50% ostial stenosis Lcx: No OM branches coming off proximal part of the vessel, possibly due to multiple high diagonals \        No definite evidence of an occluded proximal OM        Minimal luminal irregularities in prox LCx RCA: Dominant vessel. No significant disease LVEDP, LVEF normal ACS event possibly due to mild plaque erosion. ?Hypercoagulability in the setting of melanoma and immunotherapy. Recommend DAPT with Aspirin and plavix. This can be interrupted even before one year if surgical biopsy or procedure is required for melanoma. Nigel Mormon, MD Pager: [phone] Office: [phone]   MR CARDIAC MORPHOLOGY W WO CONTRAST  Result Date: 05/29/2021 CLINICAL DATA:  Elevated Troponin r/o Myocarditis EXAM: CARDIAC MRI TECHNIQUE: The patient was scanned on a 1.5 Tesla Siemens magnet. A dedicated cardiac coil was used.  Functional imaging was done using Fiesta sequences. 2,3, and 4 chamber views were done to assess for RWMA's. Modified Simpson's rule using a short axis stack was used to calculate an ejection fraction on a dedicated work Conservation officer, nature. The patient received 8 cc of Gadavist. After 10 minutes inversion recovery sequences were used to assess for infiltration and scar tissue. CONTRAST:  Gadavist FINDINGS: Normal atrial sizes. No ASD/PFO. Normal ascending aortic root 3.1 cm The aortic valve is tri leaflet There is MAC and prolapse of the posterior mitral leaflet with mild appearing MR There is normal RV size and function Overall LV EF preserved 56% (EDV 122 cc ESV 54 cc SV 68 cc ) No discrete RWMA Delayed gadolinium images showed focal uptake in the mid myocardium of the basal lateral wall and pericardium in this area T2 was elevated in this area 60.5 msec meeting [place] for myocarditis in setting of heart cath with no epicardial cAD IMPRESSION: 1. Focal gadolinium uptake in basal inferior lateral wall with pericardial involvement Elevated T2 in this region consistent with myocarditis 2.  Overall normal EF with no defined RWMA EF 57% 3.  Posterior MV leaflet prolapse with mild appearing MR 4.  Normal AV and aortic root 3.1 cm 5.  Normal RV size and function 6.  No pericardial effusion Jenkins Rouge Electronically Signed   By: Jenkins Rouge M.D.   On: 05/29/2021 15:07   US Abdomen Limited RUQ (LIVER/GB)  Result Date: 05/29/2021 CLINICAL DATA:  Abnormality in the gallbladder on recent CT EXAM: ULTRASOUND ABDOMEN LIMITED RIGHT UPPER QUADRANT COMPARISON:  05/27/2021 FINDINGS: Gallbladder: Echogenic material layers dependently within the gallbladder lumen, with posterior acoustic shadowing, consistent with cholelithiasis. Largest calculus measures 10 mm. This corresponds to the CT findings. No gallbladder wall thickening. No pericholecystic fluid. Negative sonographic Murphy sign. Common bile  duct: Diameter: 7 mm Liver: No focal lesion identified. Within normal limits in parenchymal echogenicity. Portal vein is patent on color Doppler imaging  with normal direction of blood flow towards the liver. Other: None. IMPRESSION: 1. Multiple shadowing gallstones within the gallbladder lumen. No evidence of acute cholecystitis. 2. Otherwise unremarkable exam. Electronically Signed   By: Randa Ngo M.D.   On: 05/29/2021 15:08     ASSESSMENT:  Malignant melanoma of right posterior leg (pT4 pN3 M1): - She noticed fleshy lesion 4 months ago on the right leg. - Her PMD did a biopsy which was consistent with melanoma. - Wide local excision and lymph node biopsy by Dr. Barry Dienes on 02/12/2021. - Pathology margins free, nodular type, Breslow's thickness 9.52 mm, Clark level V, deep margins free, ulceration absent, satellitosis absent, mitotic index 2/millimeters squared, LVI negative.  Neurotropism absent.  Tumor infiltrating lymphocytes absent.  Tumor regression not noted.  3 out of 3 positive sentinel lymph nodes for melanoma. - PET CT scan on 03/22/2021 with 3 foci of intense radiotracer activity within the right lower extremity.  In the medial right upper thigh nodule just superficial to thigh musculature measuring 11 mm with SUV 7.6.  Intramedullary hypermetabolic activity within the shaft of the mid right femur with SUV 14.6.  Intense activity associated with condylar notch of the distal right femur, appears to localize to soft tissue rather than the bone.  More diffuse activity associated with the medial right calf related to excision biopsy.  There is a focus of activity adjacent to the lateral margin of the right iliac wing SUV 4.1.  No evidence of visceral metastatic disease in the chest, abdomen or pelvis. - MRI of the right hip and right knee from 04/16/2021 with solitary bone metastasis in the distal right femoral diaphysis and multiple nodal metastatic disease anteromedially in the proximal right mid  thigh. - NGS testing-BRAF negative, positive for NRAS pathogenic variant exon 3, TERT promoter.  MSI-stable.  MMR-proficient.  [place] 1/2/3 fusion not detected.  KIT mutation negative.  PD-L1 negative. - Nivolumab-Relatlimab every 28 days started on 05/09/2021.    Social/family history: - She is seen with her son today.  She lives by herself at home.  She is independent of ADLs and IADLs.  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  She is non-smoker. - Father had colon cancer.  Sister had breast cancer and colon cancer.  Maternal aunt had breast cancer.  Paternal uncle had colon cancer.   PLAN:  Malignant melanoma of the right posterior leg (pT4 pN3 M1), BRAF V600 negative: - She received nivolumab/relatlimab combination on 05/09/2021 as first dose. - On 05/27/2021 she developed pain in the arms, bone pains and back pain.  She had diarrhea for 1 day but denied any skin rashes.  She came to the ER and was found to have chest pain.  Troponins were elevated in the 200 range. - Cardiac catheterization did not show any blockages. - MRI of the heart showed myocarditis. - Methylprednisone 1 g daily for 3 days started on 05/29/2021. - She did not experience any chest pain, dyspnea or orthopnea since yesterday.  No ankle swellings noted.  Lungs are clear to auscultation. - We reviewed her labs which showed troponin I 3238, down from 3927.  However BNP was slightly high at 489, from 479 previous day.  CPK was normal. - TSH was abnormal at 0.024.  Will check free T3 and free T4 levels. - We have done a twelve-lead EKG in the office which showed T inversions in V2 and V3, new from 05/29/2021. - She is  asymptomatic at this time.  She was told to go to the ER should she develop any chest pain or palpitations. - She will proceed with day 3 of methylprednisone 1 g today.  I will start her on prednisone 60 mg daily starting 06/01/2021. - We will reevaluate her on Monday with  repeat labs.  2.  Mild CKD: - Creatinine today 0.99 with GFR 58.  3.  Bone disease: - She has a solitary bone met posteriorly in the distal right femoral diaphysis. - If there is any worsening, will consider bisphosphonates.   Orders placed this encounter:  Orders Placed This Encounter  Procedures   EKG 12-Lead     Derek Jack, MD [place] [phone]   I, Thana Ates, am acting as a scribe for Dr. Derek Jack.  I, Derek Jack MD, have reviewed the above documentation for accuracy and completeness, and I agree with the above.

## 2021-05-31 NOTE — Patient Instructions (Addendum)
[place] at [facility] Discharge Instructions  You were seen and examined today by Dr. Delton Coombes. He reviewed your most recent labs and EKG results and there are slight changes.   Please keep follow up appointment as scheduled.   Thank you for choosing [place] at [facility] to provide your oncology and hematology care.  To afford each patient quality time with our provider, please arrive at least 15 minutes before your scheduled appointment time.   If you have a lab appointment with the [facility] please come in thru the Main Entrance and check in at the main information desk.  You need to re-schedule your appointment should you arrive 10 or more minutes late.  We strive to give you quality time with our providers, and arriving late affects you and other patients whose appointments are after yours.  Also, if you no show three or more times for appointments you may be dismissed from the clinic at the providers discretion.     Again, thank you for choosing [facility].  Our hope is that these requests will decrease the amount of time that you wait before being seen by our physicians.       _____________________________________________________________  Should you have questions after your visit to [facility], please contact our office at [phone] and follow the prompts.  Our office hours are 8:00 a.m. and 4:30 p.m. Monday - Friday.  Please note that voicemails left after 4:00 p.m. may not be returned until the following business day.  We are closed weekends and major holidays.  You do have access to a nurse 24-7, just call the main number to the clinic [phone] and do not press any options, hold on the line and a nurse will answer the phone.    For prescription refill requests, have your pharmacy contact our office and allow 72 hours.    Due to Covid, you will need to wear a mask upon entering the hospital.  If you do not have a mask, a mask will be given to you at the Main Entrance upon arrival. For doctor visits, patients may have 1 support person age 65 or older with them. For treatment visits, patients can not have anyone with them due to social distancing guidelines and our immunocompromised population.

## 2021-05-31 NOTE — Patient Instructions (Signed)
[facility]  Discharge Instructions: Thank you for choosing [facility] to provide your oncology and hematology care.  If you have a lab appointment with the Cancer Center, please come in thru the Main Entrance and check in at the main information desk.  Wear comfortable clothing and clothing appropriate for easy access to any Portacath or PICC line.   We strive to give you quality time with your provider. You may need to reschedule your appointment if you arrive late (15 or more minutes).  Arriving late affects you and other patients whose appointments are after yours.  Also, if you miss three or more appointments without notifying the office, you may be dismissed from the clinic at the provider's discretion.      For prescription refill requests, have your pharmacy contact our office and allow 72 hours for refills to be completed.        To help prevent nausea and vomiting after your treatment, we encourage you to take your nausea medication as directed.  BELOW ARE SYMPTOMS THAT SHOULD BE REPORTED IMMEDIATELY: *FEVER GREATER THAN 100.4 F (38 C) OR HIGHER *CHILLS OR SWEATING *NAUSEA AND VOMITING THAT IS NOT CONTROLLED WITH YOUR NAUSEA MEDICATION *UNUSUAL SHORTNESS OF BREATH *UNUSUAL BRUISING OR BLEEDING *URINARY PROBLEMS (pain or burning when urinating, or frequent urination) *BOWEL PROBLEMS (unusual diarrhea, constipation, pain near the anus) TENDERNESS IN MOUTH AND THROAT WITH OR WITHOUT PRESENCE OF ULCERS (sore throat, sores in mouth, or a toothache) UNUSUAL RASH, SWELLING OR PAIN  UNUSUAL VAGINAL DISCHARGE OR ITCHING   Items with * indicate a potential emergency and should be followed up as soon as possible or go to the Emergency Department if any problems should occur.  Please show the CHEMOTHERAPY ALERT CARD or IMMUNOTHERAPY ALERT CARD at check-in to the Emergency Department and triage nurse.  Should you have questions after your visit or need to cancel  or reschedule your appointment, please contact [facility] [phone]  and follow the prompts.  Office hours are 8:00 a.m. to 4:30 p.m. Monday - Friday. Please note that voicemails left after 4:00 p.m. may not be returned until the following business day.  We are closed weekends and major holidays. You have access to a nurse at all times for urgent questions. Please call the main number to the clinic [phone] and follow the prompts.  For any non-urgent questions, you may also contact your provider using MyChart. We now offer e-Visits for anyone 18 and older to request care online for non-urgent symptoms. For details visit [email or web address].   Also download the MyChart app! Go to the app store, search "MyChart", open the app, select [facility], and log in with your MyChart username and password.  Due to Covid, a mask is required upon entering the hospital/clinic. If you do not have a mask, one will be given to you upon arrival. For doctor visits, patients may have 1 support person aged 81 or older with them. For treatment visits, patients cannot have anyone with them due to current Covid guidelines and our immunocompromised population.  

## 2021-06-01 LAB — T3, FREE: T3, Free: 1.2 pg/mL — ABNORMAL LOW (ref 2.0–4.4)

## 2021-06-03 ENCOUNTER — Telehealth: Payer: Self-pay | Admitting: Cardiology

## 2021-06-03 ENCOUNTER — Inpatient Hospital Stay (HOSPITAL_COMMUNITY): Payer: Medicare Other | Admitting: Hematology

## 2021-06-03 ENCOUNTER — Other Ambulatory Visit: Payer: Self-pay

## 2021-06-03 ENCOUNTER — Inpatient Hospital Stay (HOSPITAL_COMMUNITY): Payer: Medicare Other

## 2021-06-03 VITALS — BP 130/61 | HR 52 | Temp 97.3°F | Resp 17 | Ht 62.0 in | Wt 183.6 lb

## 2021-06-03 DIAGNOSIS — I214 Non-ST elevation (NSTEMI) myocardial infarction: Secondary | ICD-10-CM

## 2021-06-03 DIAGNOSIS — Z87828 Personal history of other (healed) physical injury and trauma: Secondary | ICD-10-CM | POA: Diagnosis not present

## 2021-06-03 DIAGNOSIS — E876 Hypokalemia: Secondary | ICD-10-CM | POA: Diagnosis not present

## 2021-06-03 DIAGNOSIS — I409 Acute myocarditis, unspecified: Secondary | ICD-10-CM | POA: Diagnosis not present

## 2021-06-03 DIAGNOSIS — C7951 Secondary malignant neoplasm of bone: Secondary | ICD-10-CM | POA: Diagnosis not present

## 2021-06-03 DIAGNOSIS — I514 Myocarditis, unspecified: Secondary | ICD-10-CM | POA: Diagnosis not present

## 2021-06-03 DIAGNOSIS — Z803 Family history of malignant neoplasm of breast: Secondary | ICD-10-CM | POA: Diagnosis not present

## 2021-06-03 DIAGNOSIS — N182 Chronic kidney disease, stage 2 (mild): Secondary | ICD-10-CM | POA: Diagnosis not present

## 2021-06-03 DIAGNOSIS — Z947 Corneal transplant status: Secondary | ICD-10-CM | POA: Diagnosis not present

## 2021-06-03 DIAGNOSIS — Z800 Family history of malignant neoplasm of digestive organs: Secondary | ICD-10-CM | POA: Diagnosis not present

## 2021-06-03 DIAGNOSIS — I129 Hypertensive chronic kidney disease with stage 1 through stage 4 chronic kidney disease, or unspecified chronic kidney disease: Secondary | ICD-10-CM | POA: Diagnosis not present

## 2021-06-03 DIAGNOSIS — R2241 Localized swelling, mass and lump, right lower limb: Secondary | ICD-10-CM | POA: Diagnosis not present

## 2021-06-03 DIAGNOSIS — Z79899 Other long term (current) drug therapy: Secondary | ICD-10-CM | POA: Diagnosis not present

## 2021-06-03 DIAGNOSIS — C4371 Malignant melanoma of right lower limb, including hip: Secondary | ICD-10-CM

## 2021-06-03 DIAGNOSIS — Z961 Presence of intraocular lens: Secondary | ICD-10-CM | POA: Diagnosis not present

## 2021-06-03 LAB — BASIC METABOLIC PANEL
Anion gap: 12 (ref 5–15)
BUN: 22 mg/dL (ref 8–23)
CO2: 24 mmol/L (ref 22–32)
Calcium: 8.5 mg/dL — ABNORMAL LOW (ref 8.9–10.3)
Chloride: 96 mmol/L — ABNORMAL LOW (ref 98–111)
Creatinine, Ser: 0.84 mg/dL (ref 0.44–1.00)
GFR, Estimated: >60 mL/min (ref >60)
Glucose, Bld: 123 mg/dL — ABNORMAL HIGH (ref 70–99)
Potassium: 3.5 mmol/L (ref 3.5–5.1)
Sodium: 132 mmol/L — ABNORMAL LOW (ref 135–145)

## 2021-06-03 LAB — TROPONIN I (HIGH SENSITIVITY): Troponin I (High Sensitivity): 1563 ng/L (ref <18)

## 2021-06-03 LAB — BRAIN NATRIURETIC PEPTIDE: B Natriuretic Peptide: 661 pg/mL — ABNORMAL HIGH (ref 0.0–100.0)

## 2021-06-03 LAB — MAGNESIUM: Magnesium: 2.1 mg/dL (ref 1.7–2.4)

## 2021-06-03 MED ORDER — HEPARIN SOD (PORK) LOCK FLUSH 100 UNIT/ML IV SOLN
500.0000 [IU] | Freq: Once | INTRAVENOUS | Status: AC
  Administered 2021-06-03: 500 [IU] via INTRAVENOUS

## 2021-06-03 MED ORDER — SODIUM CHLORIDE 0.9% FLUSH
10.0000 mL | Freq: Once | INTRAVENOUS | Status: AC
  Administered 2021-06-03: 10 mL via INTRAVENOUS

## 2021-06-03 NOTE — Telephone Encounter (Signed)
I have tried twice to call her she hasn't answered or returned our calls

## 2021-06-03 NOTE — Patient Instructions (Addendum)
[place] at [facility] Discharge Instructions  You were seen and examined today by Dr. Delton Coombes. He reviewed your most recent labs and your troponin has came down more, your BNP is slightly increased, T4 is normal, and T3 is slightly decreased. We will keep a watch on your levels. Please keep follow up appointment as scheduled in 1 week.    Thank you for choosing [place] at [facility] to provide your oncology and hematology care.  To afford each patient quality time with our provider, please arrive at least 15 minutes before your scheduled appointment time.   If you have a lab appointment with the [facility] please come in thru the Main Entrance and check in at the main information desk.  You need to re-schedule your appointment should you arrive 10 or more minutes late.  We strive to give you quality time with our providers, and arriving late affects you and other patients whose appointments are after yours.  Also, if you no show three or more times for appointments you may be dismissed from the clinic at the providers discretion.     Again, thank you for choosing [facility].  Our hope is that these requests will decrease the amount of time that you wait before being seen by our physicians.       _____________________________________________________________  Should you have questions after your visit to [facility], please contact our office at [phone] and follow the prompts.  Our office hours are 8:00 a.m. and 4:30 p.m. Monday - Friday.  Please note that voicemails left after 4:00 p.m. may not be returned until the following business day.  We are closed weekends and major holidays.  You do have access to a nurse 24-7, just call the main number to the clinic [phone] and do not press any options, hold on the line and a nurse will answer the phone.    For prescription refill requests, have your pharmacy  contact our office and allow 72 hours.    Due to Covid, you will need to wear a mask upon entering the hospital. If you do not have a mask, a mask will be given to you at the Main Entrance upon arrival. For doctor visits, patients may have 1 support person age 93 or older with them. For treatment visits, patients can not have anyone with them due to social distancing guidelines and our immunocompromised population.

## 2021-06-03 NOTE — Progress Notes (Signed)
[place], [place] [address]   CLINIC:  Medical Oncology/Hematology  PCP:  Neale Burly, MD [place] / [place] [address] [phone]   REASON FOR VISIT:  Follow-up for metastatic malignant melanoma, BRAF V600 negative  PRIOR THERAPY: none  NGS Results: not done  CURRENT THERAPY: Opdualag (nivolumab/relatlimab) q28d  BRIEF ONCOLOGIC HISTORY:  Oncology History  Malignant melanoma of lower leg, right ([place])  03/27/2021 Initial Diagnosis   Malignant melanoma of lower leg, right ([place])   05/09/2021 -  Chemotherapy   Patient is on Treatment Plan : [name] (nivolumab/relatlimab) q28d       CANCER STAGING:  Cancer Staging  Malignant melanoma of lower leg, right ([place]) Staging form: Melanoma of the Skin, AJCC 8th Edition - Clinical stage from 03/27/2021: Stage IV (cT4a, cN3, cM1) - Unsigned   INTERVAL HISTORY:  [name], a 81 y.o. female, returns for routine follow-up of her metastatic malignant melanoma, BRAF V600 negative. [name] was last seen on 05/31/2021.   Today she reports feeling good. She denies CP, SOB, and orthopnea. She is taking prednisone and tolerating it well. She denies history of DM. Her appetite is good.   REVIEW OF SYSTEMS:  Review of Systems  Constitutional:  Negative for appetite change and fatigue.  Respiratory:  Negative for shortness of breath.   Cardiovascular:  Negative for chest pain.  All other systems reviewed and are negative.  PAST MEDICAL/SURGICAL HISTORY:  Past Medical History:  Diagnosis Date   Arthritis    Hypertension    Malignant melanoma of lower leg, right ([place]) 03/27/2021   Port-A-Cath in place 04/30/2021   Past Surgical History:  Procedure Laterality Date   CATARACT EXTRACTION W/PHACO Right 10/03/2014   Procedure: CATARACT EXTRACTION PHACO AND INTRAOCULAR LENS PLACEMENT ([place]);  Surgeon: [name], MD;  Location: [facility];  Service: Ophthalmology;   Laterality: Right;  CDE:10.09   CATARACT EXTRACTION W/PHACO Left 10/10/2014   Procedure: CATARACT EXTRACTION PHACO AND INTRAOCULAR LENS PLACEMENT (IOC);  Surgeon: [name], MD;  Location: [facility];  Service: Ophthalmology;  Laterality: Left;  CDE:8.69   CORNEAL 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   CORNEAL TRANSPLANT Left 2020   DENTAL RESTORATION/EXTRACTION WITH X-RAY     IR IMAGING GUIDED PORT INSERTION  04/05/2021   LEFT HEART CATH AND CORONARY ANGIOGRAPHY N/A 05/28/2021   Procedure: LEFT HEART CATH AND CORONARY ANGIOGRAPHY;  Surgeon: Nigel Mormon, MD;  Location: [facility];  Service: Cardiovascular;  Laterality: N/A;   MELANOMA EXCISION Right 02/12/2021   Procedure: WIDE LOCAL EXCISION RIGHT LOWER LEG MELANOMA , ADVANCEMENT FLAP CLOSURE DEFECT;  Surgeon: [name], MD;  Location: [facility];  Service: General;  Laterality: Right;   SENTINEL NODE BIOPSY Right 02/12/2021   Procedure: SENTINEL NODE BIOPSY;  Surgeon: [name], MD;  Location: [facility];  Service: General;  Laterality: Right;    SOCIAL HISTORY:  Social History   Socioeconomic History   Marital status: Widowed    Spouse name: Not on file   Number of children: Not on file   Years of education: Not on file   Highest education level: Not on file  Occupational History   Not on file  Tobacco Use   Smoking status: Never   Smokeless tobacco: Never  Vaping Use   Vaping Use: Never used  Substance and Sexual Activity   Alcohol use: No   Drug use: No   Sexual activity: Not on file  Other Topics Concern   Not on file  Social History Narrative   Not on file   Social Determinants of Health   Financial Resource Strain: Not on file  Food Insecurity: Not on file  Transportation Needs: Not on file  Physical Activity: Not on file  Stress: Not on file  Social Connections: Not on file  Intimate Partner Violence: Not on file    FAMILY HISTORY:  No family history on file.  CURRENT MEDICATIONS:  Current Outpatient  Medications  Medication Sig Dispense Refill   alendronate (FOSAMAX) 70 MG tablet Take 70 mg by mouth once a week.  0   ALPRAZolam (XANAX) 0.5 MG tablet Take 0.5 mg by mouth 3 (three) times daily.  2   aspirin EC 81 MG EC tablet Take 1 tablet (81 mg total) by mouth daily. Swallow whole. 30 tablet 11   atorvastatin (LIPITOR) 40 MG tablet Take 1 tablet (40 mg total) by mouth daily. 30 tablet 0   calcium carbonate (OS-CAL - DOSED IN MG OF ELEMENTAL CALCIUM) 1250 (500 CA) MG tablet Take 1 tablet by mouth 3 (three) times a week.     cholecalciferol (VITAMIN D) 1000 UNITS tablet Take 1,000 Units by mouth 3 (three) times a week.     Cyanocobalamin (VITAMIN B-12 PO) Take 1 tablet by mouth 3 (three) times a week.     lidocaine-prilocaine (EMLA) cream Apply a small amount to port a cath site and cover with plastic wrap 1 hour prior to port flush appointments 30 g 3   metoprolol tartrate (LOPRESSOR) 25 MG tablet Take 1 tablet (25 mg total) by mouth 2 (two) times daily. 60 tablet 0   prednisoLONE acetate (PRED FORTE) 1 % ophthalmic suspension Place 1 drop into both eyes See admin instructions. Instill one drop into the right eye twice daily and one drop into the left eye once daily.     predniSONE (DELTASONE) 20 MG tablet Take 3 tablets (60 mg total) by mouth daily with breakfast. 90 tablet 0   No current facility-administered medications for this visit.   Facility-Administered Medications Ordered in Other Visits  Medication Dose Route Frequency Provider Last Rate Last Admin   heparin lock flush 100 unit/mL  500 Units Intravenous Once Derek Jack, MD       sodium chloride flush (NS) 0.9 % injection 10 mL  10 mL Intravenous Once Derek Jack, MD        ALLERGIES:  Allergies  Allergen Reactions   Tape     Pulls skin, please use paper tape    PHYSICAL EXAM:  Performance status (ECOG): 1 - Symptomatic but completely ambulatory  Vitals:   06/03/21 1217  BP: 130/61  Pulse: (!) 52   Resp: 17  Temp: (!) 97.3 F (36.3 C)  SpO2: 98%   Wt Readings from Last 3 Encounters:  06/03/21 183 lb 9.6 oz (83.3 kg)  05/31/21 181 lb 6.4 oz (82.3 kg)  05/30/21 179 lb 9.6 oz (81.5 kg)   Physical Exam Vitals reviewed.  Constitutional:      Appearance: Normal appearance.  Cardiovascular:     Rate and Rhythm: Normal rate and regular rhythm.     Pulses: Normal pulses.     Heart sounds: Normal heart sounds.  Pulmonary:     Effort: Pulmonary effort is normal.     Breath sounds: Normal breath sounds.  Musculoskeletal:     Right lower leg: No edema.     Left lower leg: No edema.  Neurological:  General: No focal deficit present.     Mental Status: She is alert and oriented to person, place, and time.  Psychiatric:        Mood and Affect: Mood normal.        Behavior: Behavior normal.     LABORATORY DATA:  I have reviewed the labs as listed.  CBC Latest Ref Rng & Units 05/29/2021 05/28/2021 05/28/2021  WBC 4.0 - 10.5 K/uL 6.1 6.3 6.1  Hemoglobin 12.0 - 15.0 g/dL 11.8(L) 11.6(L) 11.1(L)  Hematocrit 36.0 - 46.0 % 33.8(L) 32.9(L) 32.7(L)  Platelets 150 - 400 K/uL 173 177 172   CMP Latest Ref Rng & Units 05/31/2021 05/29/2021 05/28/2021  Glucose 70 - 99 mg/dL 169(H) 109(H) -  BUN 8 - 23 mg/dL 19 <5(L) -  Creatinine 0.44 - 1.00 mg/dL 0.99 0.76 0.72  Sodium 135 - 145 mmol/L 134(L) 134(L) -  Potassium 3.5 - 5.1 mmol/L 3.6 3.0(L) -  Chloride 98 - 111 mmol/L 94(L) 93(L) -  CO2 22 - 32 mmol/L 28 30 -  Calcium 8.9 - 10.3 mg/dL 8.6(L) 8.8(L) -  Total Protein 6.5 - 8.1 g/dL - 5.9(L) -  Total Bilirubin 0.3 - 1.2 mg/dL - 1.0 -  Alkaline Phos 38 - 126 U/L - 63 -  AST 15 - 41 U/L - 61(H) -  ALT 0 - 44 U/L - 19 -    DIAGNOSTIC IMAGING:  I have independently reviewed the scans and discussed with the patient. DG Chest 2 View  Result Date: 05/27/2021 CLINICAL DATA:  Chest pain EXAM: CHEST - 2 VIEW COMPARISON:  None. FINDINGS: The heart size and mediastinal contours are within normal  limits. Right central venous line is identified with distal tip in the superior vena cava. Both lungs are clear. Degenerative joint changes of the spine noted. IMPRESSION: No active cardiopulmonary disease. Electronically Signed   By: Abelardo Diesel M.D.   On: 05/27/2021 17:22   CT Head Wo Contrast  Result Date: 05/27/2021 CLINICAL DATA:  History of melanoma EXAM: CT HEAD WITHOUT CONTRAST TECHNIQUE: Contiguous axial images were obtained from the base of the skull through the vertex without intravenous contrast. RADIATION DOSE REDUCTION: This exam was performed according to the departmental dose-optimization program which includes automated exposure control, adjustment of the mA and/or kV according to patient size and/or use of iterative reconstruction technique. COMPARISON:  None. FINDINGS: Brain: No acute territorial infarction, hemorrhage or intracranial mass. Mild atrophy. Minimal chronic small vessel ischemic changes of the white matter. Ventricles are nonenlarged Vascular: No hyperdense vessels.  No unexpected calcification Skull: Normal. Negative for fracture or focal lesion. Sinuses/Orbits: No acute finding. Other: None IMPRESSION: 1. No CT evidence for acute intracranial abnormality. 2. Mild atrophy Electronically Signed   By: Donavan Foil M.D.   On: 05/27/2021 20:10   CT Angio Chest PE W and/or Wo Contrast  Result Date: 05/27/2021 CLINICAL DATA:  Chest pain EXAM: CT ANGIOGRAPHY CHEST WITH CONTRAST TECHNIQUE: Multidetector CT imaging of the chest was performed using the standard protocol during bolus administration of intravenous contrast. Multiplanar CT image reconstructions and MIPs were obtained to evaluate the vascular anatomy. RADIATION DOSE REDUCTION: This exam was performed according to the departmental dose-optimization program which includes automated exposure control, adjustment of the mA and/or kV according to patient size and/or use of iterative reconstruction technique. CONTRAST:  49m  OMNIPAQUE IOHEXOL 350 MG/ML SOLN COMPARISON:  Chest x-ray 05/27/2021 FINDINGS: Cardiovascular: Satisfactory opacification of the pulmonary arteries to the segmental level. No evidence of pulmonary  embolism. Mild aortic atherosclerosis. No aneurysm. Mild coronary vascular calcification. Mild cardiomegaly. No pericardial effusion Mediastinum/Nodes: Midline trachea. Homogeneously enlarged thyroid. No suspicious lymph nodes. Esophagus within normal limits. Lungs/Pleura: Lungs are clear. No pleural effusion or pneumothorax. Upper Abdomen: Hyperdensity within the gallbladder with appearance not typical for stones. Musculoskeletal: No acute osseous abnormality Review of the MIP images confirms the above findings. IMPRESSION: 1. Negative for acute pulmonary embolus.  Clear lung fields. 2. Hyperdensity within the gallbladder with slightly atypical appearance for stone disease, question tumefactive sludge or mass. Suggest correlation with right upper quadrant ultrasound. Aortic Atherosclerosis (ICD10-I70.0). Electronically Signed   By: Donavan Foil M.D.   On: 05/27/2021 20:08   CARDIAC CATHETERIZATION  Result Date: 05/28/2021 Images from the original result were not included. LM: Normal LAD: Mid 30% disease         Multiple diagonal branches supplying lateral wall         Diag 3, which actually comes up quite high off the LAD, with 50% ostial stenosis Lcx: No OM branches coming off proximal part of the vessel, possibly due to multiple high diagonals \        No definite evidence of an occluded proximal OM        Minimal luminal irregularities in prox LCx RCA: Dominant vessel. No significant disease LVEDP, LVEF normal ACS event possibly due to mild plaque erosion. ?Hypercoagulability in the setting of melanoma and immunotherapy. Recommend DAPT with Aspirin and plavix. This can be interrupted even before one year if surgical biopsy or procedure is required for melanoma. Nigel Mormon, MD Pager: [phone] Office:  [phone]   MR CARDIAC MORPHOLOGY W WO CONTRAST  Result Date: 05/29/2021 CLINICAL DATA:  Elevated Troponin r/o Myocarditis EXAM: CARDIAC MRI TECHNIQUE: The patient was scanned on a 1.5 Tesla Siemens magnet. A dedicated cardiac coil was used. Functional imaging was done using Fiesta sequences. 2,3, and 4 chamber views were done to assess for RWMA's. Modified Simpson's rule using a short axis stack was used to calculate an ejection fraction on a dedicated work Conservation officer, nature. The patient received 8 cc of Gadavist. After 10 minutes inversion recovery sequences were used to assess for infiltration and scar tissue. CONTRAST:  Gadavist FINDINGS: Normal atrial sizes. No ASD/PFO. Normal ascending aortic root 3.1 cm The aortic valve is tri leaflet There is MAC and prolapse of the posterior mitral leaflet with mild appearing MR There is normal RV size and function Overall LV EF preserved 56% (EDV 122 cc ESV 54 cc SV 68 cc ) No discrete RWMA Delayed gadolinium images showed focal uptake in the mid myocardium of the basal lateral wall and pericardium in this area T2 was elevated in this area 60.5 msec meeting [place] for myocarditis in setting of heart cath with no epicardial cAD IMPRESSION: 1. Focal gadolinium uptake in basal inferior lateral wall with pericardial involvement Elevated T2 in this region consistent with myocarditis 2.  Overall normal EF with no defined RWMA EF 57% 3.  Posterior MV leaflet prolapse with mild appearing MR 4.  Normal AV and aortic root 3.1 cm 5.  Normal RV size and function 6.  No pericardial effusion [place] Electronically Signed   By: Jenkins Rouge M.D.   On: 05/29/2021 15:07   US Abdomen Limited RUQ (LIVER/GB)  Result Date: 05/29/2021 CLINICAL DATA:  Abnormality in the gallbladder on recent CT EXAM: ULTRASOUND ABDOMEN LIMITED RIGHT UPPER QUADRANT COMPARISON:  05/27/2021 FINDINGS: Gallbladder: Echogenic material layers dependently within  the gallbladder  lumen, with posterior acoustic shadowing, consistent with cholelithiasis. Largest calculus measures 10 mm. This corresponds to the CT findings. No gallbladder wall thickening. No pericholecystic fluid. Negative sonographic Murphy sign. Common bile duct: Diameter: 7 mm Liver: No focal lesion identified. Within normal limits in parenchymal echogenicity. Portal vein is patent on color Doppler imaging with normal direction of blood flow towards the liver. Other: None. IMPRESSION: 1. Multiple shadowing gallstones within the gallbladder lumen. No evidence of acute cholecystitis. 2. Otherwise unremarkable exam. Electronically Signed   By: Randa Ngo M.D.   On: 05/29/2021 15:08     ASSESSMENT:  Malignant melanoma of right posterior leg (pT4 pN3 M1): - She noticed fleshy lesion 4 months ago on the right leg. - Her PMD did a biopsy which was consistent with melanoma. - Wide local excision and lymph node biopsy by Dr. Barry Dienes on 02/12/2021. - Pathology margins free, nodular type, Breslow's thickness 9.52 mm, Clark level V, deep margins free, ulceration absent, satellitosis absent, mitotic index 2/millimeters squared, LVI negative.  Neurotropism absent.  Tumor infiltrating lymphocytes absent.  Tumor regression not noted.  3 out of 3 positive sentinel lymph nodes for melanoma. - PET CT scan on 03/22/2021 with 3 foci of intense radiotracer activity within the right lower extremity.  In the medial right upper thigh nodule just superficial to thigh musculature measuring 11 mm with SUV 7.6.  Intramedullary hypermetabolic activity within the shaft of the mid right femur with SUV 14.6.  Intense activity associated with condylar notch of the distal right femur, appears to localize to soft tissue rather than the bone.  More diffuse activity associated with the medial right calf related to excision biopsy.  There is a focus of activity adjacent to the lateral margin of the right iliac wing SUV 4.1.  No evidence of visceral  metastatic disease in the chest, abdomen or pelvis. 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 - NGS testing-BRAF negative, positive for NRAS pathogenic variant exon 3, TERT promoter.  MSI-stable.  MMR-proficient.  [place] 1/2/3 fusion not detected.  KIT mutation negative.  PD-L1 negative. - Nivolumab-Relatlimab every 28 days started on 05/09/2021.    Social/family history: - She is seen with her son today.  She lives by herself at home.  She is independent of ADLs and IADLs.  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  She is non-smoker. - Father had colon cancer.  Sister had breast cancer and colon cancer.  Maternal aunt had breast cancer.  Paternal uncle had colon cancer.   PLAN:  Malignant melanoma of the right posterior leg (pT4 pN3 M1), BRAF V600 negative: -We are holding immunotherapy due to myocarditis. - She has started prednisone 60 mg daily on 06/01/2021 and is tolerating reasonably well. - She denies any chest pains or palpitations in the last 3 days. - We reviewed her labs.  Troponin I is trending down at 1563.  However BNP has increased to 661.  She does not have any signs or symptoms of CHF.  Electrolytes including potassium and magnesium are normal. - Continue prednisone 60 mg daily.  We will reevaluate her in 1 week.  2.  Mild CKD: -Creatinine is 0.84 today.  3.  Bone disease: -She has a solitary bone met posteriorly in the distal right femoral diaphysis. - If  she cannot get any active therapy, will consider radiation if becomes painful.   Orders placed this encounter:  No orders of the defined types were placed in this encounter.    Derek Jack, MD [place] [phone]   I, Thana Ates, am acting as a scribe for Dr. Derek Jack.  I, Derek Jack MD, have reviewed the above documentation for accuracy and completeness, and I agree with the above.

## 2021-06-03 NOTE — Progress Notes (Signed)
CRITICAL VALUE ALERT Critical value received:  Troponin 1,563 Date of notification:  06-03-21 Time of notification: 4451 Critical value read back:  Yes.   Nurse who received alert:  C. Beauden Tremont RN MD notified time and response:  1346 Dr. Raliegh Ip

## 2021-06-03 NOTE — Patient Instructions (Signed)
[facility]  Discharge Instructions: Thank you for choosing [facility] to provide your oncology and hematology care.  If you have a lab appointment with the Cancer Center, please come in thru the Main Entrance and check in at the main information desk.  Wear comfortable clothing and clothing appropriate for easy access to any Portacath or PICC line.   We strive to give you quality time with your provider. You may need to reschedule your appointment if you arrive late (15 or more minutes).  Arriving late affects you and other patients whose appointments are after yours.  Also, if you miss three or more appointments without notifying the office, you may be dismissed from the clinic at the provider's discretion.      For prescription refill requests, have your pharmacy contact our office and allow 72 hours for refills to be completed.        To help prevent nausea and vomiting after your treatment, we encourage you to take your nausea medication as directed.  BELOW ARE SYMPTOMS THAT SHOULD BE REPORTED IMMEDIATELY: *FEVER GREATER THAN 100.4 F (38 C) OR HIGHER *CHILLS OR SWEATING *NAUSEA AND VOMITING THAT IS NOT CONTROLLED WITH YOUR NAUSEA MEDICATION *UNUSUAL SHORTNESS OF BREATH *UNUSUAL BRUISING OR BLEEDING *URINARY PROBLEMS (pain or burning when urinating, or frequent urination) *BOWEL PROBLEMS (unusual diarrhea, constipation, pain near the anus) TENDERNESS IN MOUTH AND THROAT WITH OR WITHOUT PRESENCE OF ULCERS (sore throat, sores in mouth, or a toothache) UNUSUAL RASH, SWELLING OR PAIN  UNUSUAL VAGINAL DISCHARGE OR ITCHING   Items with * indicate a potential emergency and should be followed up as soon as possible or go to the Emergency Department if any problems should occur.  Please show the CHEMOTHERAPY ALERT CARD or IMMUNOTHERAPY ALERT CARD at check-in to the Emergency Department and triage nurse.  Should you have questions after your visit or need to cancel  or reschedule your appointment, please contact [facility] [phone]  and follow the prompts.  Office hours are 8:00 a.m. to 4:30 p.m. Monday - Friday. Please note that voicemails left after 4:00 p.m. may not be returned until the following business day.  We are closed weekends and major holidays. You have access to a nurse at all times for urgent questions. Please call the main number to the clinic [phone] and follow the prompts.  For any non-urgent questions, you may also contact your provider using MyChart. We now offer e-Visits for anyone 18 and older to request care online for non-urgent symptoms. For details visit [email or web address].   Also download the MyChart app! Go to the app store, search "MyChart", open the app, select [facility], and log in with your MyChart username and password.  Due to Covid, a mask is required upon entering the hospital/clinic. If you do not have a mask, one will be given to you upon arrival. For doctor visits, patients may have 1 support person aged 18 or older with them. For treatment visits, patients cannot have anyone with them due to current Covid guidelines and our immunocompromised population.  

## 2021-06-03 NOTE — Telephone Encounter (Signed)
Pt has appointment on 06/19/21 with Dr. Virgina Jock ([facility] f/u). Not sure if anyone has done a TOC for this pt, but she needs a TOC. Thank you.

## 2021-06-03 NOTE — Telephone Encounter (Signed)
2nd Attempt to call patient for [facility]

## 2021-06-03 NOTE — Progress Notes (Signed)
Labs drawn from port.  Patients port flushed without difficulty.  Good blood return noted with no bruising or swelling noted at site.  Band aid applied.  VSS with discharge and left in satisfactory condition with no s/s of distress noted.  

## 2021-06-04 LAB — ECHOCARDIOGRAM COMPLETE
Area-P 1/2: 4.21 cm2
Calc EF: 56.6 %
Height: 62 in
S' Lateral: 3.4 cm
Single Plane A2C EF: 57.7 %
Single Plane A4C EF: 58.1 %
Weight: 2849.6 oz

## 2021-06-04 NOTE — Telephone Encounter (Signed)
3rd attempt to contact patient regarding her [place].

## 2021-06-06 ENCOUNTER — Ambulatory Visit (HOSPITAL_COMMUNITY): Payer: Medicare Other

## 2021-06-06 ENCOUNTER — Encounter (HOSPITAL_COMMUNITY): Payer: Medicare Other

## 2021-06-06 ENCOUNTER — Ambulatory Visit (HOSPITAL_COMMUNITY): Payer: Medicare Other | Admitting: Hematology

## 2021-06-10 ENCOUNTER — Inpatient Hospital Stay (HOSPITAL_COMMUNITY): Payer: Medicare Other

## 2021-06-10 ENCOUNTER — Encounter (HOSPITAL_COMMUNITY): Payer: Self-pay | Admitting: Hematology

## 2021-06-10 ENCOUNTER — Inpatient Hospital Stay (HOSPITAL_COMMUNITY): Payer: Medicare Other | Admitting: Hematology

## 2021-06-10 ENCOUNTER — Other Ambulatory Visit: Payer: Self-pay

## 2021-06-10 ENCOUNTER — Ambulatory Visit (HOSPITAL_COMMUNITY): Payer: Medicare Other

## 2021-06-10 VITALS — BP 145/78 | HR 63 | Temp 97.6°F | Resp 16 | Ht 62.0 in | Wt 181.8 lb

## 2021-06-10 DIAGNOSIS — N182 Chronic kidney disease, stage 2 (mild): Secondary | ICD-10-CM | POA: Diagnosis not present

## 2021-06-10 DIAGNOSIS — Z803 Family history of malignant neoplasm of breast: Secondary | ICD-10-CM | POA: Diagnosis not present

## 2021-06-10 DIAGNOSIS — Z800 Family history of malignant neoplasm of digestive organs: Secondary | ICD-10-CM | POA: Diagnosis not present

## 2021-06-10 DIAGNOSIS — I514 Myocarditis, unspecified: Secondary | ICD-10-CM | POA: Diagnosis not present

## 2021-06-10 DIAGNOSIS — C4371 Malignant melanoma of right lower limb, including hip: Secondary | ICD-10-CM

## 2021-06-10 DIAGNOSIS — I129 Hypertensive chronic kidney disease with stage 1 through stage 4 chronic kidney disease, or unspecified chronic kidney disease: Secondary | ICD-10-CM | POA: Diagnosis not present

## 2021-06-10 DIAGNOSIS — I409 Acute myocarditis, unspecified: Secondary | ICD-10-CM

## 2021-06-10 DIAGNOSIS — E876 Hypokalemia: Secondary | ICD-10-CM | POA: Diagnosis not present

## 2021-06-10 DIAGNOSIS — C7951 Secondary malignant neoplasm of bone: Secondary | ICD-10-CM | POA: Diagnosis not present

## 2021-06-10 DIAGNOSIS — I214 Non-ST elevation (NSTEMI) myocardial infarction: Secondary | ICD-10-CM

## 2021-06-10 DIAGNOSIS — Z79899 Other long term (current) drug therapy: Secondary | ICD-10-CM | POA: Diagnosis not present

## 2021-06-10 DIAGNOSIS — R2241 Localized swelling, mass and lump, right lower limb: Secondary | ICD-10-CM | POA: Diagnosis not present

## 2021-06-10 LAB — TROPONIN I (HIGH SENSITIVITY): Troponin I (High Sensitivity): 47 ng/L — ABNORMAL HIGH (ref <18)

## 2021-06-10 LAB — COMPREHENSIVE METABOLIC PANEL
ALT: 17 U/L (ref 0–44)
AST: 20 U/L (ref 15–41)
Albumin: 3.1 g/dL — ABNORMAL LOW (ref 3.5–5.0)
Alkaline Phosphatase: 77 U/L (ref 38–126)
Anion gap: 6 (ref 5–15)
BUN: 20 mg/dL (ref 8–23)
CO2: 30 mmol/L (ref 22–32)
Calcium: 8.2 mg/dL — ABNORMAL LOW (ref 8.9–10.3)
Chloride: 96 mmol/L — ABNORMAL LOW (ref 98–111)
Creatinine, Ser: 0.88 mg/dL (ref 0.44–1.00)
GFR, Estimated: >60 mL/min (ref >60)
Glucose, Bld: 154 mg/dL — ABNORMAL HIGH (ref 70–99)
Potassium: 3.9 mmol/L (ref 3.5–5.1)
Sodium: 132 mmol/L — ABNORMAL LOW (ref 135–145)
Total Bilirubin: 0.8 mg/dL (ref 0.3–1.2)
Total Protein: 6.1 g/dL — ABNORMAL LOW (ref 6.5–8.1)

## 2021-06-10 LAB — CBC WITH DIFFERENTIAL/PLATELET
Abs Immature Granulocytes: 0.17 10*3/uL — ABNORMAL HIGH (ref 0.00–0.07)
Basophils Absolute: 0 10*3/uL (ref 0.0–0.1)
Basophils Relative: 0 %
Eosinophils Absolute: 0 10*3/uL (ref 0.0–0.5)
Eosinophils Relative: 0 %
HCT: 40 % (ref 36.0–46.0)
Hemoglobin: 13.3 g/dL (ref 12.0–15.0)
Immature Granulocytes: 1 %
Lymphocytes Relative: 11 %
Lymphs Abs: 1.5 10*3/uL (ref 0.7–4.0)
MCH: 31.2 pg (ref 26.0–34.0)
MCHC: 33.3 g/dL (ref 30.0–36.0)
MCV: 93.9 fL (ref 80.0–100.0)
Monocytes Absolute: 0.6 10*3/uL (ref 0.1–1.0)
Monocytes Relative: 4 %
Neutro Abs: 11.6 10*3/uL — ABNORMAL HIGH (ref 1.7–7.7)
Neutrophils Relative %: 84 %
Platelets: 228 10*3/uL (ref 150–400)
RBC: 4.26 MIL/uL (ref 3.87–5.11)
RDW: 12.7 % (ref 11.5–15.5)
WBC: 13.8 10*3/uL — ABNORMAL HIGH (ref 4.0–10.5)
nRBC: 0 % (ref 0.0–0.2)

## 2021-06-10 LAB — TSH: TSH: 23.068 u[IU]/mL — ABNORMAL HIGH (ref 0.350–4.500)

## 2021-06-10 LAB — MAGNESIUM: Magnesium: 1.9 mg/dL (ref 1.7–2.4)

## 2021-06-10 LAB — BRAIN NATRIURETIC PEPTIDE: B Natriuretic Peptide: 384 pg/mL — ABNORMAL HIGH (ref 0.0–100.0)

## 2021-06-10 MED ORDER — SODIUM CHLORIDE 0.9% FLUSH
10.0000 mL | Freq: Once | INTRAVENOUS | Status: AC
  Administered 2021-06-10: 10 mL via INTRAVENOUS

## 2021-06-10 MED ORDER — HEPARIN SOD (PORK) LOCK FLUSH 100 UNIT/ML IV SOLN
500.0000 [IU] | Freq: Once | INTRAVENOUS | Status: AC
  Administered 2021-06-10: 500 [IU] via INTRAVENOUS

## 2021-06-10 NOTE — Progress Notes (Signed)
Elizabeth Norman, [place] [address]   CLINIC:  Medical Oncology/Hematology  PCP:  Neale Burly, MD [place] / [place] [address] [phone]   REASON FOR VISIT:  Follow-up for metastatic malignant melanoma, BRAF V600 negative  PRIOR THERAPY: none  NGS Results: not done  CURRENT THERAPY: Opdualag (nivolumab/relatlimab) q28d  BRIEF ONCOLOGIC HISTORY:  Oncology History  Malignant melanoma of lower leg, right ([place])  03/27/2021 Initial Diagnosis   Malignant melanoma of lower leg, right ([place])   05/09/2021 -  Chemotherapy   Patient is on Treatment Plan : [place] (nivolumab/relatlimab) q28d       CANCER STAGING:  Cancer Staging  Malignant melanoma of lower leg, right ([place]) Staging form: Melanoma of the Skin, AJCC 8th Edition - Clinical stage from 03/27/2021: Stage IV (cT4a, cN3, cM1) - Unsigned   INTERVAL HISTORY:  Elizabeth Norman, a 81 y.o. female, returns for routine follow-up of her metastatic malignant melanoma, BRAF V600 negative. [name] was last seen on 06/03/2021.   Today she reports feeling good. She denies CP and palpitations. She has moved back home and is once again living on her own. She denies orthopnea and SOB. She continues to take 3 prednisone tablets in the morning. She denies disrupted sleep. She report occasional sore throat when swallowing.   REVIEW OF SYSTEMS:  Review of Systems  Constitutional:  Negative for appetite change and fatigue.  HENT:   Positive for sore throat.   Cardiovascular:  Negative for chest pain and palpitations.  Psychiatric/Behavioral:  Negative for sleep disturbance.   All other systems reviewed and are negative.  PAST MEDICAL/SURGICAL HISTORY:  Past Medical History:  Diagnosis Date   Arthritis    Hypertension    Malignant melanoma of lower leg, right ([place]) 03/27/2021   Port-A-Cath in place 04/30/2021   Past Surgical History:  Procedure Laterality Date   CATARACT  EXTRACTION W/PHACO Right 10/03/2014   Procedure: CATARACT EXTRACTION PHACO AND INTRAOCULAR LENS PLACEMENT (Elizabeth Norman);  Surgeon: [name], MD;  Location: [facility];  Service: Ophthalmology;  Laterality: Right;  CDE:10.09   CATARACT EXTRACTION W/PHACO Left 10/10/2014   Procedure: CATARACT EXTRACTION PHACO AND INTRAOCULAR LENS PLACEMENT ([facility]);  Surgeon: [name], MD;  Location: [facility];  Service: Ophthalmology;  Laterality: Left;  CDE:8.69   CORNEAL 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   CORNEAL TRANSPLANT Left 2020   DENTAL RESTORATION/EXTRACTION WITH X-RAY     IR IMAGING GUIDED PORT INSERTION  04/05/2021   LEFT HEART CATH AND CORONARY ANGIOGRAPHY N/A 05/28/2021   Procedure: LEFT HEART CATH AND CORONARY ANGIOGRAPHY;  Surgeon: Nigel Mormon, MD;  Location: [facility];  Service: Cardiovascular;  Laterality: N/A;   MELANOMA EXCISION Right 02/12/2021   Procedure: WIDE LOCAL EXCISION RIGHT LOWER LEG MELANOMA , ADVANCEMENT FLAP CLOSURE DEFECT;  Surgeon: [name], MD;  Location: [facility];  Service: General;  Laterality: Right;   SENTINEL NODE BIOPSY Right 02/12/2021   Procedure: SENTINEL NODE BIOPSY;  Surgeon: [name], MD;  Location: [facility];  Service: General;  Laterality: Right;    SOCIAL HISTORY:  Social History   Socioeconomic History   Marital status: Widowed    Spouse name: Not on file   Number of children: Not on file   Years of education: Not on file   Highest education level: Not on file  Occupational History   Not on file  Tobacco Use   Smoking status: Never   Smokeless tobacco: Never  Vaping Use   Vaping Use: Never used  Substance and Sexual Activity   Alcohol use: No   Drug use: No   Sexual activity: Not on file  Other Topics Concern   Not on file  Social History Narrative   Not on file   Social Determinants of Health   Financial Resource Strain: Not on file  Food Insecurity: Not on file  Transportation Needs: Not on file  Physical Activity: Not on file  Stress:  Not on file  Social Connections: Not on file  Intimate Partner Violence: Not on file    FAMILY HISTORY:  History reviewed. No pertinent family history.  CURRENT MEDICATIONS:  Current Outpatient Medications  Medication Sig Dispense Refill   alendronate (FOSAMAX) 70 MG tablet Take 70 mg by mouth once a week.  0   ALPRAZolam (XANAX) 0.5 MG tablet Take 0.5 mg by mouth 3 (three) times daily.  2   aspirin EC 81 MG EC tablet Take 1 tablet (81 mg total) by mouth daily. Swallow whole. 30 tablet 11   atorvastatin (LIPITOR) 40 MG tablet Take 1 tablet (40 mg total) by mouth daily. 30 tablet 0   calcium carbonate (OS-CAL - DOSED IN MG OF ELEMENTAL CALCIUM) 1250 (500 CA) MG tablet Take 1 tablet by mouth 3 (three) times a week.     cholecalciferol (VITAMIN D) 1000 UNITS tablet Take 1,000 Units by mouth 3 (three) times a week.     Cyanocobalamin (VITAMIN B-12 PO) Take 1 tablet by mouth 3 (three) times a week.     lidocaine-prilocaine (EMLA) cream Apply a small amount to port a cath site and cover with plastic wrap 1 hour prior to port flush appointments 30 g 3   metoprolol tartrate (LOPRESSOR) 25 MG tablet Take 1 tablet (25 mg total) by mouth 2 (two) times daily. 60 tablet 0   prednisoLONE acetate (PRED FORTE) 1 % ophthalmic suspension Place 1 drop into both eyes See admin instructions. Instill one drop into the right eye twice daily and one drop into the left eye once daily.     predniSONE (DELTASONE) 20 MG tablet Take 3 tablets (60 mg total) by mouth daily with breakfast. 90 tablet 0   No current facility-administered medications for this visit.    ALLERGIES:  Allergies  Allergen Reactions   Tape     Pulls skin, please use paper tape    PHYSICAL EXAM:  Performance status (ECOG): 0 - Asymptomatic  Vitals:   06/10/21 0834  BP: (!) 145/78  Pulse: 63  Resp: 16  Temp: 97.6 F (36.4 C)  SpO2: 100%   Wt Readings from Last 3 Encounters:  06/10/21 181 lb 12.8 oz (82.5 kg)  06/03/21 183 lb 9.6  oz (83.3 kg)  05/31/21 181 lb 6.4 oz (82.3 kg)   Physical Exam Vitals reviewed.  Constitutional:      Appearance: Normal appearance. She is obese.  HENT:     Mouth/Throat:     Comments: No thrush present Cardiovascular:     Rate and Rhythm: Normal rate and regular rhythm.     Pulses: Normal pulses.     Heart sounds: Normal heart sounds.  Pulmonary:     Effort: Pulmonary effort is normal.     Breath sounds: Normal breath sounds.  Musculoskeletal:     Right lower leg: No edema.     Left lower leg: No edema.  Neurological:     General: No focal deficit present.     Mental Status: She  is alert and oriented to person, place, and time.  Psychiatric:        Mood and Affect: Mood normal.        Behavior: Behavior normal.     LABORATORY DATA:  I have reviewed the labs as listed.  CBC Latest Ref Rng & Units 06/10/2021 05/29/2021 05/28/2021  WBC 4.0 - 10.5 K/uL 13.8(H) 6.1 6.3  Hemoglobin 12.0 - 15.0 g/dL 13.3 11.8(L) 11.6(L)  Hematocrit 36.0 - 46.0 % 40.0 33.8(L) 32.9(L)  Platelets 150 - 400 K/uL 228 173 177   CMP Latest Ref Rng & Units 06/03/2021 05/31/2021 05/29/2021  Glucose 70 - 99 mg/dL 123(H) 169(H) 109(H)  BUN 8 - 23 mg/dL 22 19 <5(L)  Creatinine 0.44 - 1.00 mg/dL 0.84 0.99 0.76  Sodium 135 - 145 mmol/L 132(L) 134(L) 134(L)  Potassium 3.5 - 5.1 mmol/L 3.5 3.6 3.0(L)  Chloride 98 - 111 mmol/L 96(L) 94(L) 93(L)  CO2 22 - 32 mmol/L _0 Calcium 8.9 - 10.3 mg/dL 8.5(L) 8.6(L) 8.8(L)  Total Protein 6.5 - 8.1 g/dL - - 5.9(L)  Total Bilirubin 0.3 - 1.2 mg/dL - - 1.0  Alkaline Phos 38 - 126 U/L - - 63  AST 15 - 41 U/L - - 61(H)  ALT 0 - 44 U/L - - 19    DIAGNOSTIC IMAGING:  I have independently reviewed the scans and discussed with the patient. DG Chest 2 View  Result Date: 05/27/2021 CLINICAL DATA:  Chest pain EXAM: CHEST - 2 VIEW COMPARISON:  None. FINDINGS: The heart size and mediastinal contours are within normal limits. Right central venous line is identified with  distal tip in the superior vena cava. Both lungs are clear. Degenerative joint changes of the spine noted. IMPRESSION: No active cardiopulmonary disease. Electronically Signed   By: Abelardo Diesel M.D.   On: 05/27/2021 17:22   CT Head Wo Contrast  Result Date: 05/27/2021 CLINICAL DATA:  History of melanoma EXAM: CT HEAD WITHOUT CONTRAST TECHNIQUE: Contiguous axial images were obtained from the base of the skull through the vertex without intravenous contrast. RADIATION DOSE REDUCTION: This exam was performed according to the departmental dose-optimization program which includes automated exposure control, adjustment of the mA and/or kV according to patient size and/or use of iterative reconstruction technique. COMPARISON:  None. FINDINGS: Brain: No acute territorial infarction, hemorrhage or intracranial mass. Mild atrophy. Minimal chronic small vessel ischemic changes of the white matter. Ventricles are nonenlarged Vascular: No hyperdense vessels.  No unexpected calcification Skull: Normal. Negative for fracture or focal lesion. Sinuses/Orbits: No acute finding. Other: None IMPRESSION: 1. No CT evidence for acute intracranial abnormality. 2. Mild atrophy Electronically Signed   By: Donavan Foil M.D.   On: 05/27/2021 20:10   CT Angio Chest PE W and/or Wo Contrast  Result Date: 05/27/2021 CLINICAL DATA:  Chest pain EXAM: CT ANGIOGRAPHY CHEST WITH CONTRAST TECHNIQUE: Multidetector CT imaging of the chest was performed using the standard protocol during bolus administration of intravenous contrast. Multiplanar CT image reconstructions and MIPs were obtained to evaluate the vascular anatomy. RADIATION DOSE REDUCTION: This exam was performed according to the departmental dose-optimization program which includes automated exposure control, adjustment of the mA and/or kV according to patient size and/or use of iterative reconstruction technique. CONTRAST:  72m OMNIPAQUE IOHEXOL 350 MG/ML SOLN COMPARISON:  Chest  x-ray 05/27/2021 FINDINGS: Cardiovascular: Satisfactory opacification of the pulmonary arteries to the segmental level. No evidence of pulmonary embolism. Mild aortic atherosclerosis. No aneurysm. Mild coronary vascular calcification. Mild cardiomegaly.  No pericardial effusion Mediastinum/Nodes: Midline trachea. Homogeneously enlarged thyroid. No suspicious lymph nodes. Esophagus within normal limits. Lungs/Pleura: Lungs are clear. No pleural effusion or pneumothorax. Upper Abdomen: Hyperdensity within the gallbladder with appearance not typical for stones. Musculoskeletal: No acute osseous abnormality Review of the MIP images confirms the above findings. IMPRESSION: 1. Negative for acute pulmonary embolus.  Clear lung fields. 2. Hyperdensity within the gallbladder with slightly atypical appearance for stone disease, question tumefactive sludge or mass. Suggest correlation with right upper quadrant ultrasound. Aortic Atherosclerosis (ICD10-I70.0). Electronically Signed   By: Donavan Foil M.D.   On: 05/27/2021 20:08   CARDIAC CATHETERIZATION  Result Date: 05/28/2021 Images from the original result were not included. LM: Normal LAD: Mid 30% disease         Multiple diagonal branches supplying lateral wall         Diag 3, which actually comes up quite high off the LAD, with 50% ostial stenosis Lcx: No OM branches coming off proximal part of the vessel, possibly due to multiple high diagonals \        No definite evidence of an occluded proximal OM        Minimal luminal irregularities in prox LCx RCA: Dominant vessel. No significant disease LVEDP, LVEF normal ACS event possibly due to mild plaque erosion. ?Hypercoagulability in the setting of melanoma and immunotherapy. Recommend DAPT with Aspirin and plavix. This can be interrupted even before one year if surgical biopsy or procedure is required for melanoma. Nigel Mormon, MD Pager: [phone] Office: [phone]   MR CARDIAC MORPHOLOGY W WO  CONTRAST  Result Date: 05/29/2021 CLINICAL DATA:  Elevated Troponin r/o Myocarditis EXAM: CARDIAC MRI TECHNIQUE: The patient was scanned on a 1.5 Tesla Siemens magnet. A dedicated cardiac coil was used. Functional imaging was done using Fiesta sequences. 2,3, and 4 chamber views were done to assess for RWMA's. Modified Simpson's rule using a short axis stack was used to calculate an ejection fraction on a dedicated work Conservation officer, nature. The patient received 8 cc of Gadavist. After 10 minutes inversion recovery sequences were used to assess for infiltration and scar tissue. CONTRAST:  Gadavist FINDINGS: Normal atrial sizes. No ASD/PFO. Normal ascending aortic root 3.1 cm The aortic valve is tri leaflet There is MAC and prolapse of the posterior mitral leaflet with mild appearing MR There is normal RV size and function Overall LV EF preserved 56% (EDV 122 cc ESV 54 cc SV 68 cc ) No discrete RWMA Delayed gadolinium images showed focal uptake in the mid myocardium of the basal lateral wall and pericardium in this area T2 was elevated in this area 60.5 msec meeting [place] for myocarditis in setting of heart cath with no epicardial cAD IMPRESSION: 1. Focal gadolinium uptake in basal inferior lateral wall with pericardial involvement Elevated T2 in this region consistent with myocarditis 2.  Overall normal EF with no defined RWMA EF 57% 3.  Posterior MV leaflet prolapse with mild appearing MR 4.  Normal AV and aortic root 3.1 cm 5.  Normal RV size and function 6.  No pericardial effusion Jenkins Rouge Electronically Signed   By: Jenkins Rouge M.D.   On: 05/29/2021 15:07   ECHOCARDIOGRAM COMPLETE  Result Date: 06/04/2021    ECHOCARDIOGRAM REPORT   Patient Name:   LENIYA BREIT Date of Exam: 05/29/2021 Medical Rec #:  [id number]     Height:       62.0 in Accession #:    [id number]  Weight:       178.1 lb Date of Birth:  1941-01-25      BSA:          1.820 m Patient Age:    80 years      BP:            131/59 mmHg Patient Gender: F             HR:           80 bpm. Exam Location:  Inpatient Procedure: 2D Echo, 3D Echo, Cardiac Doppler and Color Doppler Indications:     122-I22.9 Subsequent ST elevation (STEM) and non-ST elevation                  (NSTEMI) myocardial infarction  History:         Patient has no prior history of Echocardiogram examinations.                  CAD and Previous Myocardial Infarction, Signs/Symptoms:Chest                  Pain; Risk Factors:Hypertension. Cancer.  Sonographer:     Roseanna Rainbow RDCS Referring Phys:  [id number] [facility] J PATWARDHAN Diagnosing Phys: Adrian Prows MD  Sonographer Comments: Technically difficult study due to poor echo windows. IMPRESSIONS  1. Left ventricular ejection fraction, by estimation, is 55 to 60%. The left ventricle has normal function. The left ventricle has no regional wall motion abnormalities. Left ventricular diastolic parameters were normal.  2. Right ventricular systolic function is normal. The right ventricular size is normal. There is mildly elevated pulmonary artery systolic pressure. The estimated right ventricular systolic pressure is 76.7 mmHg.  3. The mitral valve is degenerative. Mild mitral valve regurgitation. No evidence of mitral stenosis.  4. Tricuspid valve regurgitation is moderate.  5. The aortic valve is normal in structure. Aortic valve regurgitation is not visualized. No aortic stenosis is present. FINDINGS  Left Ventricle: Left ventricular ejection fraction, by estimation, is 55 to 60%. The left ventricle has normal function. The left ventricle has no regional wall motion abnormalities. The left ventricular internal cavity size was normal in size. There is  no left ventricular hypertrophy. Left ventricular diastolic parameters were normal. Right Ventricle: The right ventricular size is normal. No increase in right ventricular wall thickness. Right ventricular systolic function is normal. There is mildly elevated pulmonary artery  systolic pressure. The tricuspid regurgitant velocity is 2.74  m/s, and with an assumed right atrial pressure of 8 mmHg, the estimated right ventricular systolic pressure is 20.9 mmHg. Left Atrium: Left atrial size was normal in size. Right Atrium: Right atrial size was normal in size. Pericardium: There is no evidence of pericardial effusion. Mitral Valve: The mitral valve is degenerative in appearance. There is mild thickening of the mitral valve leaflet(s). There is moderate calcification of the posterior mitral valve leaflet(s). Mild mitral valve regurgitation. No evidence of mitral valve stenosis. Tricuspid Valve: The tricuspid valve is normal in structure. Tricuspid valve regurgitation is moderate. Aortic Valve: The aortic valve is normal in structure. Aortic valve regurgitation is not visualized. No aortic stenosis is present. Pulmonic Valve: The pulmonic valve was grossly normal. Pulmonic valve regurgitation is not visualized. Aorta: The aortic root is normal in size and structure. IAS/Shunts: No atrial level shunt detected by color flow Doppler.  LEFT VENTRICLE PLAX 2D LVIDd:         4.80 cm     Diastology LVIDs:  3.40 cm     LV e' medial:    5.55 cm/s LV PW:         0.70 cm     LV E/e' medial:  12.7 LV IVS:        1.10 cm     LV e' lateral:   9.68 cm/s LVOT diam:     2.00 cm     LV E/e' lateral: 7.3 LV SV:         82 LV SV Index:   45 LVOT Area:     3.14 cm  LV Volumes (MOD) LV vol d, MOD A2C: 52.9 ml LV vol d, MOD A4C: 52.7 ml LV vol s, MOD A2C: 22.4 ml LV vol s, MOD A4C: 22.1 ml LV SV MOD A2C:     30.5 ml LV SV MOD A4C:     52.7 ml LV SV MOD BP:      30.0 ml RIGHT VENTRICLE             IVC RV S prime:     13.50 cm/s  IVC diam: 1.60 cm TAPSE (M-mode): 2.2 cm LEFT ATRIUM             Index        RIGHT ATRIUM           Index LA diam:        3.80 cm 2.09 cm/m   RA Area:     10.45 cm LA Vol (A2C):   62.7 ml 34.46 ml/m  RA Volume:   19.55 ml  10.74 ml/m LA Vol (A4C):   43.0 ml 23.63 ml/m LA  Biplane Vol: 56.0 ml 30.77 ml/m  AORTIC VALVE LVOT Vmax:   130.00 cm/s LVOT Vmean:  8440.000 cm/s LVOT VTI:    0.260 m  AORTA Ao Root diam: 3.00 cm Ao Asc diam:  3.60 cm MITRAL VALVE               TRICUSPID VALVE MV Area (PHT): 4.21 cm    TR Peak grad:   30.0 mmHg MV Decel Time: 180 msec    TR Vmax:        274.00 cm/s MV E velocity: 70.60 cm/s MV A velocity: 89.20 cm/s  SHUNTS MV E/A ratio:  0.79        Systemic VTI:  0.26 m                            Systemic Diam: 2.00 cm Adrian Prows MD Electronically signed by Adrian Prows MD Signature Date/Time: 06/04/2021/9:32:23 AM    Final    US Abdomen Limited RUQ (LIVER/GB)  Result Date: 05/29/2021 CLINICAL DATA:  Abnormality in the gallbladder on recent CT EXAM: ULTRASOUND ABDOMEN LIMITED RIGHT UPPER QUADRANT COMPARISON:  05/27/2021 FINDINGS: Gallbladder: Echogenic material layers dependently within the gallbladder lumen, with posterior acoustic shadowing, consistent with cholelithiasis. Largest calculus measures 10 mm. This corresponds to the CT findings. No gallbladder wall thickening. No pericholecystic fluid. Negative sonographic Murphy sign. Common bile duct: Diameter: 7 mm Liver: No focal lesion identified. Within normal limits in parenchymal echogenicity. Portal vein is patent on color Doppler imaging with normal direction of blood flow towards the liver. Other: None. IMPRESSION: 1. Multiple shadowing gallstones within the gallbladder lumen. No evidence of acute cholecystitis. 2. Otherwise unremarkable exam. Electronically Signed   By: Randa Ngo M.D.   On: 05/29/2021 15:08     ASSESSMENT:  Malignant melanoma of right  posterior leg (pT4 pN3 M1): - She noticed fleshy lesion 4 months ago on the right leg. - Her PMD did a biopsy which was consistent with melanoma. - Wide local excision and lymph node biopsy by Dr. Barry Dienes on 02/12/2021. - Pathology margins free, nodular type, Breslow's thickness 9.52 mm, Clark level V, deep margins free, ulceration absent,  satellitosis absent, mitotic index 2/millimeters squared, LVI negative.  Neurotropism absent.  Tumor infiltrating lymphocytes absent.  Tumor regression not noted.  3 out of 3 positive sentinel lymph nodes for melanoma. - PET CT scan on 03/22/2021 with 3 foci of intense radiotracer activity within the right lower extremity.  In the medial right upper thigh nodule just superficial to thigh musculature measuring 11 mm with SUV 7.6.  Intramedullary hypermetabolic activity within the shaft of the mid right femur with SUV 14.6.  Intense activity associated with condylar notch of the distal right femur, appears to localize to soft tissue rather than the bone.  More diffuse activity associated with the medial right calf related to excision biopsy.  There is a focus of activity adjacent to the lateral margin of the right iliac wing SUV 4.1.  No evidence of visceral metastatic disease in the chest, abdomen or pelvis. 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 - NGS testing-BRAF negative, positive for NRAS pathogenic variant exon 3, TERT promoter.  MSI-stable.  MMR-proficient.  [place] 1/2/3 fusion not detected.  KIT mutation negative.  PD-L1 negative. - Nivolumab-Relatlimab every 28 days started on 05/09/2021.    Social/family history: - She is seen with her son today.  She lives by herself at home.  She is independent of ADLs and IADLs.  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  She is non-smoker. - Father had colon cancer.  Sister had breast cancer and colon cancer.  Maternal aunt had breast cancer.  Paternal uncle had colon cancer.   PLAN:  Malignant melanoma of the right posterior leg (pT4 pN3 M1), BRAF V600 negative: - Immunotherapy on hold due to myocarditis. - She is taking prednisone 60 mg daily which  was started on 06/01/2021. - She denies any chest pains or palpitations.  No PND or orthopnea. - Reviewed labs today which showed troponin I improved to 47.  BNP was 384, down from 661.  Electrolytes were normal.  Elevated white count due to prednisone. - Recommend prednisone taper.  She will cut back to 40 mg daily starting tomorrow.  Next week she will cut down prednisone to 20 mg daily. - RTC 2 weeks for follow-up. - TSH today is 23.  We will plan to repeat TSH, check free T3 and T4 at next visit.  2.  Mild CKD: - Creatinine today 0.88.  3.  Bone disease: - She has a solitary bone met posteriorly in the distal right femoral diaphysis, painless. - If she cannot get any active therapy, will consider radiation.   Orders placed this encounter:  No orders of the defined types were placed in this encounter.    Derek Jack, MD [place] [phone]   I, Thana Ates, am acting as a scribe for Dr. Derek Jack.  I, Derek Jack MD, have reviewed the above documentation for accuracy and completeness, and I agree with the above.

## 2021-06-10 NOTE — Progress Notes (Signed)
Patients port flushed without difficulty.  Good blood return noted with no bruising or swelling noted at site.  Band aid applied.  VSS with discharge and left in satisfactory condition with no s/s of distress noted.   

## 2021-06-10 NOTE — Patient Instructions (Addendum)
[place] at [facility] Discharge Instructions   You were seen and examined today by Dr. Delton Coombes.  Starting tomorrow take 2 prednisone tablets daily for 1 week, then decrease to 1 tablet daily until you see [place] again.   Return as scheduled in 2 weeks.    Thank you for choosing [place] at [facility] to provide your oncology and hematology care.  To afford each patient quality time with our provider, please arrive at least 15 minutes before your scheduled appointment time.   If you have a lab appointment with the [facility] please come in thru the Main Entrance and check in at the main information desk.  You need to re-schedule your appointment should you arrive 10 or more minutes late.  We strive to give you quality time with our providers, and arriving late affects you and other patients whose appointments are after yours.  Also, if you no show three or more times for appointments you may be dismissed from the clinic at the providers discretion.     Again, thank you for choosing [facility].  Our hope is that these requests will decrease the amount of time that you wait before being seen by our physicians.       _____________________________________________________________  Should you have questions after your visit to [facility], please contact our office at [phone] and follow the prompts.  Our office hours are 8:00 a.m. and 4:30 p.m. Monday - Friday.  Please note that voicemails left after 4:00 p.m. may not be returned until the following business day.  We are closed weekends and major holidays.  You do have access to a nurse 24-7, just call the main number to the clinic [phone] and do not press any options, hold on the line and a nurse will answer the phone.    For prescription refill requests, have your pharmacy contact our office and allow 72 hours.    Due to Covid, you will need to wear a mask  upon entering the hospital. If you do not have a mask, a mask will be given to you at the Main Entrance upon arrival. For doctor visits, patients may have 1 support person age 82 or older with them. For treatment visits, patients can not have anyone with them due to social distancing guidelines and our immunocompromised population.

## 2021-06-11 ENCOUNTER — Other Ambulatory Visit (HOSPITAL_COMMUNITY): Payer: Self-pay | Admitting: *Deleted

## 2021-06-11 ENCOUNTER — Other Ambulatory Visit (HOSPITAL_COMMUNITY): Payer: Self-pay

## 2021-06-11 DIAGNOSIS — C4371 Malignant melanoma of right lower limb, including hip: Secondary | ICD-10-CM

## 2021-06-11 DIAGNOSIS — I409 Acute myocarditis, unspecified: Secondary | ICD-10-CM

## 2021-06-19 ENCOUNTER — Other Ambulatory Visit: Payer: Self-pay

## 2021-06-19 ENCOUNTER — Ambulatory Visit: Payer: Medicare Other | Admitting: Cardiology

## 2021-06-19 ENCOUNTER — Encounter: Payer: Self-pay | Admitting: Cardiology

## 2021-06-19 VITALS — BP 123/74 | HR 84 | Temp 98.0°F | Resp 16 | Ht 62.0 in | Wt 180.0 lb

## 2021-06-19 DIAGNOSIS — I10 Essential (primary) hypertension: Secondary | ICD-10-CM

## 2021-06-19 DIAGNOSIS — I401 Isolated myocarditis: Secondary | ICD-10-CM

## 2021-06-19 NOTE — Progress Notes (Signed)
Follow up visit  Subjective:   Elizabeth Norman, female    DOB: [birth year], 81 y.o.   MRN: [id number]     HPI  Chief Complaint  Patient presents with   Hypertension   myocarditis   New Patient (Initial Visit)    81 year old Caucasian female with hypertension, malignant melanoma right leg, recent myocarditis (05/2021)  Patient was admitted in 05/2021 with chest pain, trop elevation 12,000. Coronary angiography did not show any significant CAD. Cardiac MRI showed focal myocarditis. Her immunotherapy was thus kept on hold. She is doing well, denies chest pain, shortness of breath, palpitations, leg edema, orthopnea, PND, TIA/syncope.  Current Outpatient Medications on File Prior to Visit  Medication Sig Dispense Refill   alendronate (FOSAMAX) 70 MG tablet Take 70 mg by mouth once a week.  0   ALPRAZolam (XANAX) 0.5 MG tablet Take 0.5 mg by mouth 3 (three) times daily.  2   aspirin EC 81 MG EC tablet Take 1 tablet (81 mg total) by mouth daily. Swallow whole. 30 tablet 11   atorvastatin (LIPITOR) 40 MG tablet Take 1 tablet (40 mg total) by mouth daily. 30 tablet 0   calcium carbonate (OS-CAL - DOSED IN MG OF ELEMENTAL CALCIUM) 1250 (500 CA) MG tablet Take 1 tablet by mouth 3 (three) times a week.     cholecalciferol (VITAMIN D) 1000 UNITS tablet Take 1,000 Units by mouth 3 (three) times a week.     Cyanocobalamin (VITAMIN B-12 PO) Take 1 tablet by mouth 3 (three) times a week.     metoprolol tartrate (LOPRESSOR) 25 MG tablet Take 1 tablet (25 mg total) by mouth 2 (two) times daily. 60 tablet 0   prednisoLONE acetate (PRED FORTE) 1 % ophthalmic suspension Place 1 drop into both eyes See admin instructions. Instill one drop into the right eye twice daily and one drop into the left eye once daily.     predniSONE (DELTASONE) 20 MG tablet Take 3 tablets (60 mg total) by mouth daily with breakfast. 90 tablet 0   No current facility-administered medications on file prior to visit.     Cardiovascular & other pertient studies:  EKG 06/19/2021: Sinus rhythm 64 bpm Frequent PACs   Incomplete RBBB Possible olf inferior infarct  Cardiac MRI 05/29/2021: 1. Focal gadolinium uptake in basal inferior lateral wall with pericardial involvement Elevated T2 in this region consistent with myocarditis 2.  Overall normal EF with no defined RWMA EF 57% 3.  Posterior MV leaflet prolapse with mild appearing MR 4.  Normal AV and aortic root 3.1 cm 5.  Normal RV size and function 6.  No pericardial effusion    Echocardiogram 05/29/2021:  1. Left ventricular ejection fraction, by estimation, is 55 to 60%. The  left ventricle has normal function. The left ventricle has no regional  wall motion abnormalities. Left ventricular diastolic parameters were  normal.   2. Right ventricular systolic function is normal. The right ventricular  size is normal. There is mildly elevated pulmonary artery systolic  pressure. The estimated right ventricular systolic pressure is 12.1 mmHg.   3. The mitral valve is degenerative. Mild mitral valve regurgitation. No  evidence of mitral stenosis.   4. Tricuspid valve regurgitation is moderate.   5. The aortic valve is normal in structure. Aortic valve regurgitation is  not visualized. No aortic stenosis is present.   Coronary angiography 05/28/2021: LM: Normal  LAD: Mid 30% disease         Multiple diagonal branches supplying  lateral wall         Diag 3, which actually comes up quite high off the LAD, with 50% ostial stenosis Lcx: No OM branches coming off proximal part of the vessel, possibly due to multiple high diagonals \        No definite evidence of an occluded proximal OM        Minimal luminal irregularities in prox LCx RCA: Dominant vessel. No significant disease   LVEDP, LVEF normal  Recent labs: 06/10/2021: Glucose 154, BUN/Cr 20/0.88. EGFR >60. Na/K 132/3.9. Rest of the CMP normal H/H 13/40. MCV 93. Platelets 228 HbA1C 5.6% Chol  143, TG 86, HDL 31, LDL 95 TSH 23 high   Latest Reference Range & Units 05/31/21 09:35 06/03/21 12:19 06/10/21 07:57  B Natriuretic Peptide 0.0 - 100.0 pg/mL  661.0 (H) 384.0 (H)  Troponin I (High Sensitivity) <18 ng/L 3,238 (HH) 1,563 (HH) 47 (H)  (HH): Data is critically high (H): Data is abnormally high   Review of Systems  Cardiovascular:  Negative for chest pain, dyspnea on exertion, leg swelling, palpitations and syncope.        Vitals:   06/19/21 0930  BP: 123/74  Pulse: 84  Resp: 16  Temp: 98 F (36.7 C)  SpO2: 98%    Body mass index is 32.92 kg/m. Filed Weights   06/19/21 0930  Weight: 180 lb (81.6 kg)     Objective:   Physical Exam Vitals and nursing note reviewed.  Constitutional:      General: She is not in acute distress. Neck:     Vascular: No JVD.  Cardiovascular:     Rate and Rhythm: Normal rate and regular rhythm.     Heart sounds: Normal heart sounds. No murmur heard. Pulmonary:     Effort: Pulmonary effort is normal.     Breath sounds: Normal breath sounds. No wheezing or rales.  Musculoskeletal:     Right lower leg: No edema.     Left lower leg: No edema.          Assessment & Recommendations:   81 year old Caucasian female with hypertension, malignant melanoma right leg, recent myocarditis (05/2021)  Myocarditis: Likely related to immunotherapy for malignant melanoma, currently on hold. On tapering dose of prednisone as per oncology In absence of bleeding reasonable to continue Aspirin 81 mg given high trop rise with her myocarditis episode. Continue statin  Hypertension: Controlled  F/u in 6 months    Nigel Mormon, MD Pager: [phone] Office: [phone]

## 2021-06-24 ENCOUNTER — Inpatient Hospital Stay (HOSPITAL_COMMUNITY): Payer: Medicare Other

## 2021-06-24 ENCOUNTER — Inpatient Hospital Stay (HOSPITAL_COMMUNITY): Payer: Medicare Other | Attending: Hematology | Admitting: Hematology

## 2021-06-24 ENCOUNTER — Other Ambulatory Visit: Payer: Self-pay

## 2021-06-24 DIAGNOSIS — Z800 Family history of malignant neoplasm of digestive organs: Secondary | ICD-10-CM | POA: Diagnosis not present

## 2021-06-24 DIAGNOSIS — Z803 Family history of malignant neoplasm of breast: Secondary | ICD-10-CM | POA: Diagnosis not present

## 2021-06-24 DIAGNOSIS — I409 Acute myocarditis, unspecified: Secondary | ICD-10-CM | POA: Insufficient documentation

## 2021-06-24 DIAGNOSIS — R5383 Other fatigue: Secondary | ICD-10-CM | POA: Diagnosis not present

## 2021-06-24 DIAGNOSIS — I252 Old myocardial infarction: Secondary | ICD-10-CM | POA: Diagnosis not present

## 2021-06-24 DIAGNOSIS — C4371 Malignant melanoma of right lower limb, including hip: Secondary | ICD-10-CM | POA: Diagnosis not present

## 2021-06-24 DIAGNOSIS — E039 Hypothyroidism, unspecified: Secondary | ICD-10-CM | POA: Insufficient documentation

## 2021-06-24 DIAGNOSIS — C7951 Secondary malignant neoplasm of bone: Secondary | ICD-10-CM | POA: Insufficient documentation

## 2021-06-24 DIAGNOSIS — N182 Chronic kidney disease, stage 2 (mild): Secondary | ICD-10-CM | POA: Insufficient documentation

## 2021-06-24 DIAGNOSIS — F32A Depression, unspecified: Secondary | ICD-10-CM | POA: Diagnosis not present

## 2021-06-24 DIAGNOSIS — I129 Hypertensive chronic kidney disease with stage 1 through stage 4 chronic kidney disease, or unspecified chronic kidney disease: Secondary | ICD-10-CM | POA: Diagnosis not present

## 2021-06-24 DIAGNOSIS — Z7982 Long term (current) use of aspirin: Secondary | ICD-10-CM | POA: Diagnosis not present

## 2021-06-24 DIAGNOSIS — Z7952 Long term (current) use of systemic steroids: Secondary | ICD-10-CM | POA: Insufficient documentation

## 2021-06-24 DIAGNOSIS — Z79899 Other long term (current) drug therapy: Secondary | ICD-10-CM | POA: Diagnosis not present

## 2021-06-24 LAB — CBC WITH DIFFERENTIAL/PLATELET
Abs Immature Granulocytes: 0.08 10*3/uL — ABNORMAL HIGH (ref 0.00–0.07)
Basophils Absolute: 0 10*3/uL (ref 0.0–0.1)
Basophils Relative: 0 %
Eosinophils Absolute: 0 10*3/uL (ref 0.0–0.5)
Eosinophils Relative: 0 %
HCT: 45.4 % (ref 36.0–46.0)
Hemoglobin: 14.8 g/dL (ref 12.0–15.0)
Immature Granulocytes: 1 %
Lymphocytes Relative: 11 %
Lymphs Abs: 0.9 10*3/uL (ref 0.7–4.0)
MCH: 30 pg (ref 26.0–34.0)
MCHC: 32.6 g/dL (ref 30.0–36.0)
MCV: 92.1 fL (ref 80.0–100.0)
Monocytes Absolute: 0.4 10*3/uL (ref 0.1–1.0)
Monocytes Relative: 5 %
Neutro Abs: 6.8 10*3/uL (ref 1.7–7.7)
Neutrophils Relative %: 83 %
Platelets: 158 10*3/uL (ref 150–400)
RBC: 4.93 MIL/uL (ref 3.87–5.11)
RDW: 14 % (ref 11.5–15.5)
WBC: 8.3 10*3/uL (ref 4.0–10.5)
nRBC: 0 % (ref 0.0–0.2)

## 2021-06-24 LAB — COMPREHENSIVE METABOLIC PANEL
ALT: 22 U/L (ref 0–44)
AST: 24 U/L (ref 15–41)
Albumin: 3.7 g/dL (ref 3.5–5.0)
Alkaline Phosphatase: 67 U/L (ref 38–126)
Anion gap: 11 (ref 5–15)
BUN: 20 mg/dL (ref 8–23)
CO2: 26 mmol/L (ref 22–32)
Calcium: 9 mg/dL (ref 8.9–10.3)
Chloride: 95 mmol/L — ABNORMAL LOW (ref 98–111)
Creatinine, Ser: 0.96 mg/dL (ref 0.44–1.00)
GFR, Estimated: 60 mL/min — ABNORMAL LOW (ref >60)
Glucose, Bld: 174 mg/dL — ABNORMAL HIGH (ref 70–99)
Potassium: 3.9 mmol/L (ref 3.5–5.1)
Sodium: 132 mmol/L — ABNORMAL LOW (ref 135–145)
Total Bilirubin: 1.2 mg/dL (ref 0.3–1.2)
Total Protein: 6.5 g/dL (ref 6.5–8.1)

## 2021-06-24 LAB — MAGNESIUM: Magnesium: 2 mg/dL (ref 1.7–2.4)

## 2021-06-24 LAB — TSH: TSH: 104.222 u[IU]/mL — ABNORMAL HIGH (ref 0.350–4.500)

## 2021-06-24 LAB — TROPONIN I (HIGH SENSITIVITY): Troponin I (High Sensitivity): 25 ng/L — ABNORMAL HIGH (ref <18)

## 2021-06-24 LAB — T4, FREE: Free T4: <0.25 ng/dL — ABNORMAL LOW (ref 0.61–1.12)

## 2021-06-24 LAB — BRAIN NATRIURETIC PEPTIDE: B Natriuretic Peptide: 181 pg/mL — ABNORMAL HIGH (ref 0.0–100.0)

## 2021-06-24 MED ORDER — SODIUM CHLORIDE 0.9% FLUSH
10.0000 mL | Freq: Once | INTRAVENOUS | Status: AC
  Administered 2021-06-24: 10 mL via INTRAVENOUS

## 2021-06-24 MED ORDER — HEPARIN SOD (PORK) LOCK FLUSH 100 UNIT/ML IV SOLN
500.0000 [IU] | Freq: Once | INTRAVENOUS | Status: AC
  Administered 2021-06-24: 500 [IU] via INTRAVENOUS

## 2021-06-24 MED ORDER — LEVOTHYROXINE SODIUM 50 MCG PO TABS: 50.0000 ug | ORAL_TABLET | Freq: Every day | ORAL | 3 refills | Status: DC

## 2021-06-24 NOTE — Patient Instructions (Addendum)
[place] at [facility] Discharge Instructions   You were seen and examined today by Dr. Delton Coombes.  He reviewed the results of your lab work which is normal/stable.  Your troponin is still elevated, but is much improved. We are awaiting the results of your thyroid lab test.  Your lack of energy and depression could be related to your thyroid gland being affected.  We will see what today's results show and possibly put you on a medication for your thyroid function. If your thyroid function is normal, we will put you on something for depression.  Start taking prednisone 1/2 tablet (10 mg) every day for 1 week.  After that, take 1/2 pill every other day after this until you see [place] again.   Return as scheduled for lab work and office visit.      Thank you for choosing [place] at [facility] to provide your oncology and hematology care.  To afford each patient quality time with our provider, please arrive at least 15 minutes before your scheduled appointment time.   If you have a lab appointment with the [facility] please come in thru the Main Entrance and check in at the main information desk.  You need to re-schedule your appointment should you arrive 10 or more minutes late.  We strive to give you quality time with our providers, and arriving late affects you and other patients whose appointments are after yours.  Also, if you no show three or more times for appointments you may be dismissed from the clinic at the providers discretion.     Again, thank you for choosing [facility]  Our hope is that these requests will decrease the amount of time that you wait before being seen by our physicians.       _____________________________________________________________  Should you have questions after your visit to [facility], please contact our office at [phone] and follow the prompts.  Our office hours are 8:00  a.m. and 4:30 p.m. Monday - Friday.  Please note that voicemails left after 4:00 p.m. may not be returned until the following business day.  We are closed weekends and major holidays.  You do have access to a nurse 24-7, just call the main number to the clinic [phone] and do not press any options, hold on the line and a nurse will answer the phone.    For prescription refill requests, have your pharmacy contact our office and allow 72 hours.    Due to Covid, you will need to wear a mask upon entering the hospital. If you do not have a mask, a mask will be given to you at the Main Entrance upon arrival. For doctor visits, patients may have 1 support person age 14 or older with them. For treatment visits, patients can not have anyone with them due to social distancing guidelines and our immunocompromised population.

## 2021-06-24 NOTE — Progress Notes (Signed)
[place], [place] [address]   CLINIC:  Medical Oncology/Hematology  PCP:  Neale Burly, MD [place] / [place] [address] [phone]   REASON FOR VISIT:  Follow-up for metastatic malignant melanoma, BRAF V600 negative  PRIOR THERAPY: none  NGS Results: not done  CURRENT THERAPY: Opdualag (nivolumab/relatlimab) q28d  BRIEF ONCOLOGIC HISTORY:  Oncology History  Malignant melanoma of lower leg, right ([facility])  03/27/2021 Initial Diagnosis   Malignant melanoma of lower leg, right ([facility])   05/09/2021 -  Chemotherapy   Patient is on Treatment Plan : [place] (nivolumab/relatlimab) q28d       CANCER STAGING:  Cancer Staging  Malignant melanoma of lower leg, right ([facility]) Staging form: Melanoma of the Skin, AJCC 8th Edition - Clinical stage from 03/27/2021: Stage IV (cT4a, cN3, cM1) - Unsigned   INTERVAL HISTORY:  Elizabeth Norman, a 81 y.o. female, returns for routine follow-up of her metastatic malignant melanoma, BRAF V600 negative. Elizabeth Norman was last seen on 06/10/2021.   Today Elizabeth Norman reports feeling fair. Elizabeth Norman reports increased fatigue and depression since her last visit. Elizabeth Norman denies SOB, orthopnea, CP, and lightheadedness. Elizabeth Norman denies current pains. Her appetite is good.   REVIEW OF SYSTEMS:  Review of Systems  Constitutional:  Positive for fatigue. Negative for appetite change.  Respiratory:  Negative for shortness of breath.   Cardiovascular:  Negative for chest pain.  Musculoskeletal:  Negative for arthralgias and myalgias.  Neurological:  Negative for light-headedness.  Psychiatric/Behavioral:  Positive for depression.   All other systems reviewed and are negative.  PAST MEDICAL/SURGICAL HISTORY:  Past Medical History:  Diagnosis Date   Arthritis    Hypertension    Malignant melanoma of lower leg, right ([facility]) 03/27/2021   Port-A-Cath in place 04/30/2021   Past Surgical History:  Procedure Laterality Date    CATARACT EXTRACTION W/PHACO Right 10/03/2014   Procedure: CATARACT EXTRACTION PHACO AND INTRAOCULAR LENS PLACEMENT ([facility]);  Surgeon: [name], MD;  Location: [facility];  Service: Ophthalmology;  Laterality: Right;  CDE:10.09   CATARACT EXTRACTION W/PHACO Left 10/10/2014   Procedure: CATARACT EXTRACTION PHACO AND INTRAOCULAR LENS PLACEMENT ([facility]);  Surgeon: [name], MD;  Location: [facility];  Service: Ophthalmology;  Laterality: Left;  CDE:8.69   CORNEAL 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   CORNEAL TRANSPLANT Left 2020   DENTAL RESTORATION/EXTRACTION WITH X-RAY     IR IMAGING GUIDED PORT INSERTION  04/05/2021   LEFT HEART CATH AND CORONARY ANGIOGRAPHY N/A 05/28/2021   Procedure: LEFT HEART CATH AND CORONARY ANGIOGRAPHY;  Surgeon: Nigel Mormon, MD;  Location: [facility];  Service: Cardiovascular;  Laterality: N/A;   MELANOMA EXCISION Right 02/12/2021   Procedure: WIDE LOCAL EXCISION RIGHT LOWER LEG MELANOMA , ADVANCEMENT FLAP CLOSURE DEFECT;  Surgeon: [name], MD;  Location: [facility];  Service: General;  Laterality: Right;   SENTINEL NODE BIOPSY Right 02/12/2021   Procedure: SENTINEL NODE BIOPSY;  Surgeon: [name], MD;  Location: [facility];  Service: General;  Laterality: Right;    SOCIAL HISTORY:  Social History   Socioeconomic History   Marital status: Widowed    Spouse name: Not on file   Number of children: 3   Years of education: Not on file   Highest education level: Not on file  Occupational History   Not on file  Tobacco Use   Smoking status: Never   Smokeless tobacco: Never  Vaping Use   Vaping Use: Never used  Substance and Sexual Activity   Alcohol use: No   Drug use: No   Sexual activity: Not on file  Other Topics Concern   Not on file  Social History Narrative   Not on file   Social Determinants of Health   Financial Resource Strain: Not on file  Food Insecurity: Not on file  Transportation Needs: Not on file  Physical Activity: Not on file   Stress: Not on file  Social Connections: Not on file  Intimate Partner Violence: Not on file    FAMILY HISTORY:  Family History  Problem Relation Age of Onset   Cancer Father    Hypertension Sister    Hypertension Sister     CURRENT MEDICATIONS:  Current Outpatient Medications  Medication Sig Dispense Refill   alendronate (FOSAMAX) 70 MG tablet Take 70 mg by mouth once a week.  0   ALPRAZolam (XANAX) 0.5 MG tablet Take 0.5 mg by mouth 3 (three) times daily.  2   aspirin EC 81 MG EC tablet Take 1 tablet (81 mg total) by mouth daily. Swallow whole. 30 tablet 11   atorvastatin (LIPITOR) 40 MG tablet Take 1 tablet (40 mg total) by mouth daily. 30 tablet 0   calcium carbonate (OS-CAL - DOSED IN MG OF ELEMENTAL CALCIUM) 1250 (500 CA) MG tablet Take 1 tablet by mouth 3 (three) times a week.     cholecalciferol (VITAMIN D) 1000 UNITS tablet Take 1,000 Units by mouth 3 (three) times a week.     Cyanocobalamin (VITAMIN B-12 PO) Take 1 tablet by mouth 3 (three) times a week.     metoprolol tartrate (LOPRESSOR) 25 MG tablet Take 1 tablet (25 mg total) by mouth 2 (two) times daily. 60 tablet 0   prednisoLONE acetate (PRED FORTE) 1 % ophthalmic suspension Place 1 drop into both eyes See admin instructions. Instill one drop into the right eye twice daily and one drop into the left eye once daily.     predniSONE (DELTASONE) 20 MG tablet Take 3 tablets (60 mg total) by mouth daily with breakfast. 90 tablet 0   No current facility-administered medications for this visit.   Facility-Administered Medications Ordered in Other Visits  Medication Dose Route Frequency Provider Last Rate Last Admin   heparin lock flush 100 unit/mL  500 Units Intravenous Once Derek Jack, MD       sodium chloride flush (NS) 0.9 % injection 10 mL  10 mL Intravenous Once Derek Jack, MD        ALLERGIES:  Allergies  Allergen Reactions   Tape     Pulls skin, please use paper tape    PHYSICAL EXAM:   Performance status (ECOG): 0 - Asymptomatic  There were no vitals filed for this visit. Wt Readings from Last 3 Encounters:  06/24/21 177 lb 12.8 oz (80.6 kg)  06/19/21 180 lb (81.6 kg)  06/10/21 181 lb 12.8 oz (82.5 kg)   Physical Exam Vitals reviewed.  Constitutional:      Appearance: Normal appearance. Elizabeth Norman is obese.  Cardiovascular:     Rate and Rhythm: Normal rate and regular rhythm.     Pulses: Normal pulses.     Heart sounds: Normal heart sounds.  Pulmonary:     Effort: Pulmonary effort is normal.     Breath sounds: Normal breath sounds.  Neurological:     General: No focal deficit present.     Mental Status: Elizabeth Norman is alert and oriented to person, place, and time.  Psychiatric:  Mood and Affect: Mood normal.        Behavior: Behavior normal.     LABORATORY DATA:  I have reviewed the labs as listed.  CBC Latest Ref Rng & Units 06/24/2021 06/10/2021 05/29/2021  WBC 4.0 - 10.5 K/uL 8.3 13.8(H) 6.1  Hemoglobin 12.0 - 15.0 g/dL 14.8 13.3 11.8(L)  Hematocrit 36.0 - 46.0 % 45.4 40.0 33.8(L)  Platelets 150 - 400 K/uL 158 228 173   CMP Latest Ref Rng & Units 06/24/2021 06/10/2021 06/03/2021  Glucose 70 - 99 mg/dL 174(H) 154(H) 123(H)  BUN 8 - 23 mg/dL _0 Creatinine 0.44 - 1.00 mg/dL 0.96 0.88 0.84  Sodium 135 - 145 mmol/L 132(L) 132(L) 132(L)  Potassium 3.5 - 5.1 mmol/L 3.9 3.9 3.5  Chloride 98 - 111 mmol/L 95(L) 96(L) 96(L)  CO2 22 - 32 mmol/L _1 Calcium 8.9 - 10.3 mg/dL 9.0 8.2(L) 8.5(L)  Total Protein 6.5 - 8.1 g/dL 6.5 6.1(L) -  Total Bilirubin 0.3 - 1.2 mg/dL 1.2 0.8 -  Alkaline Phos 38 - 126 U/L 67 77 -  AST 15 - 41 U/L 24 20 -  ALT 0 - 44 U/L 22 17 -    DIAGNOSTIC IMAGING:  I have independently reviewed the scans and discussed with the patient. DG Chest 2 View  Result Date: 05/27/2021 CLINICAL DATA:  Chest pain EXAM: CHEST - 2 VIEW COMPARISON:  None. FINDINGS: The heart size and mediastinal contours are within normal limits. Right central  venous line is identified with distal tip in the superior vena cava. Both lungs are clear. Degenerative joint changes of the spine noted. IMPRESSION: No active cardiopulmonary disease. Electronically Signed   By: Abelardo Diesel M.D.   On: 05/27/2021 17:22   CT Head Wo Contrast  Result Date: 05/27/2021 CLINICAL DATA:  History of melanoma EXAM: CT HEAD WITHOUT CONTRAST TECHNIQUE: Contiguous axial images were obtained from the base of the skull through the vertex without intravenous contrast. RADIATION DOSE REDUCTION: This exam was performed according to the departmental dose-optimization program which includes automated exposure control, adjustment of the mA and/or kV according to patient size and/or use of iterative reconstruction technique. COMPARISON:  None. FINDINGS: Brain: No acute territorial infarction, hemorrhage or intracranial mass. Mild atrophy. Minimal chronic small vessel ischemic changes of the white matter. Ventricles are nonenlarged Vascular: No hyperdense vessels.  No unexpected calcification Skull: Normal. Negative for fracture or focal lesion. Sinuses/Orbits: No acute finding. Other: None IMPRESSION: 1. No CT evidence for acute intracranial abnormality. 2. Mild atrophy Electronically Signed   By: Donavan Foil M.D.   On: 05/27/2021 20:10   CT Angio Chest PE W and/or Wo Contrast  Result Date: 05/27/2021 CLINICAL DATA:  Chest pain EXAM: CT ANGIOGRAPHY CHEST WITH CONTRAST TECHNIQUE: Multidetector CT imaging of the chest was performed using the standard protocol during bolus administration of intravenous contrast. Multiplanar CT image reconstructions and MIPs were obtained to evaluate the vascular anatomy. RADIATION DOSE REDUCTION: This exam was performed according to the departmental dose-optimization program which includes automated exposure control, adjustment of the mA and/or kV according to patient size and/or use of iterative reconstruction technique. CONTRAST:  90m OMNIPAQUE IOHEXOL 350  MG/ML SOLN COMPARISON:  Chest x-ray 05/27/2021 FINDINGS: Cardiovascular: Satisfactory opacification of the pulmonary arteries to the segmental level. No evidence of pulmonary embolism. Mild aortic atherosclerosis. No aneurysm. Mild coronary vascular calcification. Mild cardiomegaly. No pericardial effusion Mediastinum/Nodes: Midline trachea. Homogeneously enlarged thyroid. No suspicious lymph nodes. Esophagus within normal limits. Lungs/Pleura:  Lungs are clear. No pleural effusion or pneumothorax. Upper Abdomen: Hyperdensity within the gallbladder with appearance not typical for stones. Musculoskeletal: No acute osseous abnormality Review of the MIP images confirms the above findings. IMPRESSION: 1. Negative for acute pulmonary embolus.  Clear lung fields. 2. Hyperdensity within the gallbladder with slightly atypical appearance for stone disease, question tumefactive sludge or mass. Suggest correlation with right upper quadrant ultrasound. Aortic Atherosclerosis (ICD10-I70.0). Electronically Signed   By: Donavan Foil M.D.   On: 05/27/2021 20:08   CARDIAC CATHETERIZATION  Result Date: 05/28/2021 Images from the original result were not included. LM: Normal LAD: Mid 30% disease         Multiple diagonal branches supplying lateral wall         Diag 3, which actually comes up quite high off the LAD, with 50% ostial stenosis Lcx: No OM branches coming off proximal part of the vessel, possibly due to multiple high diagonals \        No definite evidence of an occluded proximal OM        Minimal luminal irregularities in prox LCx RCA: Dominant vessel. No significant disease LVEDP, LVEF normal ACS event possibly due to mild plaque erosion. ?Hypercoagulability in the setting of melanoma and immunotherapy. Recommend DAPT with Aspirin and plavix. This can be interrupted even before one year if surgical biopsy or procedure is required for melanoma. Nigel Mormon, MD Pager: [phone] Office: [phone]   MR  CARDIAC MORPHOLOGY W WO CONTRAST  Result Date: 05/29/2021 CLINICAL DATA:  Elevated Troponin r/o Myocarditis EXAM: CARDIAC MRI TECHNIQUE: The patient was scanned on a 1.5 Tesla Siemens magnet. A dedicated cardiac coil was used. Functional imaging was done using Fiesta sequences. 2,3, and 4 chamber views were done to assess for RWMA's. Modified Simpson's rule using a short axis stack was used to calculate an ejection fraction on a dedicated work Conservation officer, nature. The patient received 8 cc of Gadavist. After 10 minutes inversion recovery sequences were used to assess for infiltration and scar tissue. CONTRAST:  Gadavist FINDINGS: Normal atrial sizes. No ASD/PFO. Normal ascending aortic root 3.1 cm The aortic valve is tri leaflet There is MAC and prolapse of the posterior mitral leaflet with mild appearing MR There is normal RV size and function Overall LV EF preserved 56% (EDV 122 cc ESV 54 cc SV 68 cc ) No discrete RWMA Delayed gadolinium images showed focal uptake in the mid myocardium of the basal lateral wall and pericardium in this area T2 was elevated in this area 60.5 msec meeting [place] for myocarditis in setting of heart cath with no epicardial cAD IMPRESSION: 1. Focal gadolinium uptake in basal inferior lateral wall with pericardial involvement Elevated T2 in this region consistent with myocarditis 2.  Overall normal EF with no defined RWMA EF 57% 3.  Posterior MV leaflet prolapse with mild appearing MR 4.  Normal AV and aortic root 3.1 cm 5.  Normal RV size and function 6.  No pericardial effusion Jenkins Rouge Electronically Signed   By: Jenkins Rouge M.D.   On: 05/29/2021 15:07   ECHOCARDIOGRAM COMPLETE  Result Date: 06/04/2021    ECHOCARDIOGRAM REPORT   Patient Name:   PHYLICIA MCGAUGH Date of Exam: 05/29/2021 Medical Rec #:  [id number]     Height:       62.0 in Accession #:    [id number]    Weight:       178.1 lb Date of Birth:  1941-05-06  BSA:          1.820 m Patient Age:     17 years      BP:           131/59 mmHg Patient Gender: F             HR:           80 bpm. Exam Location:  Inpatient Procedure: 2D Echo, 3D Echo, Cardiac Doppler and Color Doppler Indications:     122-I22.9 Subsequent ST elevation (STEM) and non-ST elevation                  (NSTEMI) myocardial infarction  History:         Patient has no prior history of Echocardiogram examinations.                  CAD and Previous Myocardial Infarction, Signs/Symptoms:Chest                  Pain; Risk Factors:Hypertension. Cancer.  Sonographer:     Roseanna Rainbow RDCS Referring Phys:  [id number] [facility] J PATWARDHAN Diagnosing Phys: Adrian Prows MD  Sonographer Comments: Technically difficult study due to poor echo windows. IMPRESSIONS  1. Left ventricular ejection fraction, by estimation, is 55 to 60%. The left ventricle has normal function. The left ventricle has no regional wall motion abnormalities. Left ventricular diastolic parameters were normal.  2. Right ventricular systolic function is normal. The right ventricular size is normal. There is mildly elevated pulmonary artery systolic pressure. The estimated right ventricular systolic pressure is 20.9 mmHg.  3. The mitral valve is degenerative. Mild mitral valve regurgitation. No evidence of mitral stenosis.  4. Tricuspid valve regurgitation is moderate.  5. The aortic valve is normal in structure. Aortic valve regurgitation is not visualized. No aortic stenosis is present. FINDINGS  Left Ventricle: Left ventricular ejection fraction, by estimation, is 55 to 60%. The left ventricle has normal function. The left ventricle has no regional wall motion abnormalities. The left ventricular internal cavity size was normal in size. There is  no left ventricular hypertrophy. Left ventricular diastolic parameters were normal. Right Ventricle: The right ventricular size is normal. No increase in right ventricular wall thickness. Right ventricular systolic function is normal. There is mildly  elevated pulmonary artery systolic pressure. The tricuspid regurgitant velocity is 2.74  m/s, and with an assumed right atrial pressure of 8 mmHg, the estimated right ventricular systolic pressure is 47.0 mmHg. Left Atrium: Left atrial size was normal in size. Right Atrium: Right atrial size was normal in size. Pericardium: There is no evidence of pericardial effusion. Mitral Valve: The mitral valve is degenerative in appearance. There is mild thickening of the mitral valve leaflet(s). There is moderate calcification of the posterior mitral valve leaflet(s). Mild mitral valve regurgitation. No evidence of mitral valve stenosis. Tricuspid Valve: The tricuspid valve is normal in structure. Tricuspid valve regurgitation is moderate. Aortic Valve: The aortic valve is normal in structure. Aortic valve regurgitation is not visualized. No aortic stenosis is present. Pulmonic Valve: The pulmonic valve was grossly normal. Pulmonic valve regurgitation is not visualized. Aorta: The aortic root is normal in size and structure. IAS/Shunts: No atrial level shunt detected by color flow Doppler.  LEFT VENTRICLE PLAX 2D LVIDd:         4.80 cm     Diastology LVIDs:         3.40 cm     LV e' medial:    5.55 cm/s  LV PW:         0.70 cm     LV E/e' medial:  12.7 LV IVS:        1.10 cm     LV e' lateral:   9.68 cm/s LVOT diam:     2.00 cm     LV E/e' lateral: 7.3 LV SV:         82 LV SV Index:   45 LVOT Area:     3.14 cm  LV Volumes (MOD) LV vol d, MOD A2C: 52.9 ml LV vol d, MOD A4C: 52.7 ml LV vol s, MOD A2C: 22.4 ml LV vol s, MOD A4C: 22.1 ml LV SV MOD A2C:     30.5 ml LV SV MOD A4C:     52.7 ml LV SV MOD BP:      30.0 ml RIGHT VENTRICLE             IVC RV S prime:     13.50 cm/s  IVC diam: 1.60 cm TAPSE (M-mode): 2.2 cm LEFT ATRIUM             Index        RIGHT ATRIUM           Index LA diam:        3.80 cm 2.09 cm/m   RA Area:     10.45 cm LA Vol (A2C):   62.7 ml 34.46 ml/m  RA Volume:   19.55 ml  10.74 ml/m LA Vol (A4C):    43.0 ml 23.63 ml/m LA Biplane Vol: 56.0 ml 30.77 ml/m  AORTIC VALVE LVOT Vmax:   130.00 cm/s LVOT Vmean:  8440.000 cm/s LVOT VTI:    0.260 m  AORTA Ao Root diam: 3.00 cm Ao Asc diam:  3.60 cm MITRAL VALVE               TRICUSPID VALVE MV Area (PHT): 4.21 cm    TR Peak grad:   30.0 mmHg MV Decel Time: 180 msec    TR Vmax:        274.00 cm/s MV E velocity: 70.60 cm/s MV A velocity: 89.20 cm/s  SHUNTS MV E/A ratio:  0.79        Systemic VTI:  0.26 m                            Systemic Diam: 2.00 cm Adrian Prows MD Electronically signed by Adrian Prows MD Signature Date/Time: 06/04/2021/9:32:23 AM    Final    US Abdomen Limited RUQ (LIVER/GB)  Result Date: 05/29/2021 CLINICAL DATA:  Abnormality in the gallbladder on recent CT EXAM: ULTRASOUND ABDOMEN LIMITED RIGHT UPPER QUADRANT COMPARISON:  05/27/2021 FINDINGS: Gallbladder: Echogenic material layers dependently within the gallbladder lumen, with posterior acoustic shadowing, consistent with cholelithiasis. Largest calculus measures 10 mm. This corresponds to the CT findings. No gallbladder wall thickening. No pericholecystic fluid. Negative sonographic Murphy sign. Common bile duct: Diameter: 7 mm Liver: No focal lesion identified. Within normal limits in parenchymal echogenicity. Portal vein is patent on color Doppler imaging with normal direction of blood flow towards the liver. Other: None. IMPRESSION: 1. Multiple shadowing gallstones within the gallbladder lumen. No evidence of acute cholecystitis. 2. Otherwise unremarkable exam. Electronically Signed   By: Randa Ngo M.D.   On: 05/29/2021 15:08     ASSESSMENT:  Malignant melanoma of right posterior leg (pT4 pN3 M1): - Elizabeth Norman noticed fleshy lesion 4 months ago on  the right leg. - Her PMD did a biopsy which was consistent with melanoma. - Wide local excision and lymph node biopsy by Dr. Barry Dienes on 02/12/2021. - Pathology margins free, nodular type, Breslow's thickness 9.52 mm, Clark level V, deep margins free,  ulceration absent, satellitosis absent, mitotic index 2/millimeters squared, LVI negative.  Neurotropism absent.  Tumor infiltrating lymphocytes absent.  Tumor regression not noted.  3 out of 3 positive sentinel lymph nodes for melanoma. - PET CT scan on 03/22/2021 with 3 foci of intense radiotracer activity within the right lower extremity.  In the medial right upper thigh nodule just superficial to thigh musculature measuring 11 mm with SUV 7.6.  Intramedullary hypermetabolic activity within the shaft of the mid right femur with SUV 14.6.  Intense activity associated with condylar notch of the distal right femur, appears to localize to soft tissue rather than the bone.  More diffuse activity associated with the medial right calf related to excision biopsy.  There is a focus of activity adjacent to the lateral margin of the right iliac wing SUV 4.1.  No evidence of visceral metastatic disease in the chest, abdomen or pelvis. 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 - NGS testing-BRAF negative, positive for NRAS pathogenic variant exon 3, TERT promoter.  MSI-stable.  MMR-proficient.  Topaz 1/2/3 fusion not detected.  KIT mutation negative.  PD-L1 negative. - Nivolumab-Relatlimab every 28 days started on 05/09/2021.    Social/family history: - Elizabeth Norman is seen with her son today.  Elizabeth Norman lives by herself at home.  Elizabeth Norman is independent of ADLs and IADLs.  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  Elizabeth Norman is non-smoker. - Father had colon cancer.  Sister had breast cancer and colon cancer.  Maternal aunt had breast cancer.  Paternal uncle had colon cancer.   PLAN:  Malignant melanoma of the right posterior leg (pT4 pN3 M1), BRAF V600 negative: - Prednisone 60 mg daily started on 06/01/2021. - Elizabeth Norman is currently  taking prednisone 20 mg daily. - Elizabeth Norman does not report any chest pains or CHF symptoms. - Reviewed labs today which showed troponin I improved to 25.  BNP also improved to 181 from 384. - Elizabeth Norman is complaining of feeling very low and depressed since last visit.  Differential diagnosis includes rapid tapering of steroids versus pronounced hypothyroidism. - I will be starting her on Synthroid. - I plan to see her back in a month with repeat PET CT scan. - Elizabeth Norman will be a candidate for NRAS mutation based trial through [facility].  2.  Mild CKD: - Creatinine today 0.96.  3.  Bone disease: - Elizabeth Norman has a solitary bone metastasis in the right femoral diaphysis, painless. - We will follow-up on subsequent PET scan.  4.  Hypothyroidism: - TSH today is 104, up from 23 on 06/10/2021. - We will start her on Synthroid 50 mcg daily.   Orders placed this encounter:  No orders of the defined types were placed in this encounter.    Derek Jack, MD [place] [phone]   I, Thana Ates, am acting as a scribe for Dr. Derek Jack.  I, Derek Jack MD, have reviewed the above documentation for accuracy and completeness, and I agree with the above.

## 2021-06-24 NOTE — Progress Notes (Signed)
Patient port-a-cath flushed and deaccessed without incident. Patient tolerated well without complaints. Site covered with a bandage. Patient discharged from clinic with son, ambulatory and in stable condition.

## 2021-06-25 LAB — T3, FREE: T3, Free: <0.3 pg/mL — ABNORMAL LOW (ref 2.0–4.4)

## 2021-07-01 DIAGNOSIS — Z0000 Encounter for general adult medical examination without abnormal findings: Secondary | ICD-10-CM | POA: Diagnosis not present

## 2021-07-01 DIAGNOSIS — E7849 Other hyperlipidemia: Secondary | ICD-10-CM | POA: Diagnosis not present

## 2021-07-01 DIAGNOSIS — I10 Essential (primary) hypertension: Secondary | ICD-10-CM | POA: Diagnosis not present

## 2021-07-01 DIAGNOSIS — M810 Age-related osteoporosis without current pathological fracture: Secondary | ICD-10-CM | POA: Diagnosis not present

## 2021-07-18 ENCOUNTER — Inpatient Hospital Stay (HOSPITAL_COMMUNITY): Payer: Medicare Other | Attending: Hematology

## 2021-07-18 ENCOUNTER — Other Ambulatory Visit: Payer: Self-pay

## 2021-07-18 ENCOUNTER — Ambulatory Visit (HOSPITAL_COMMUNITY): Payer: Medicare Other

## 2021-07-18 DIAGNOSIS — I129 Hypertensive chronic kidney disease with stage 1 through stage 4 chronic kidney disease, or unspecified chronic kidney disease: Secondary | ICD-10-CM | POA: Insufficient documentation

## 2021-07-18 DIAGNOSIS — Z79899 Other long term (current) drug therapy: Secondary | ICD-10-CM | POA: Insufficient documentation

## 2021-07-18 DIAGNOSIS — Z800 Family history of malignant neoplasm of digestive organs: Secondary | ICD-10-CM | POA: Insufficient documentation

## 2021-07-18 DIAGNOSIS — Z803 Family history of malignant neoplasm of breast: Secondary | ICD-10-CM | POA: Diagnosis not present

## 2021-07-18 DIAGNOSIS — N182 Chronic kidney disease, stage 2 (mild): Secondary | ICD-10-CM | POA: Diagnosis not present

## 2021-07-18 DIAGNOSIS — I514 Myocarditis, unspecified: Secondary | ICD-10-CM | POA: Insufficient documentation

## 2021-07-18 DIAGNOSIS — I214 Non-ST elevation (NSTEMI) myocardial infarction: Secondary | ICD-10-CM

## 2021-07-18 DIAGNOSIS — C7951 Secondary malignant neoplasm of bone: Secondary | ICD-10-CM | POA: Diagnosis not present

## 2021-07-18 DIAGNOSIS — E039 Hypothyroidism, unspecified: Secondary | ICD-10-CM | POA: Insufficient documentation

## 2021-07-18 DIAGNOSIS — I409 Acute myocarditis, unspecified: Secondary | ICD-10-CM

## 2021-07-18 DIAGNOSIS — C4371 Malignant melanoma of right lower limb, including hip: Secondary | ICD-10-CM

## 2021-07-18 LAB — COMPREHENSIVE METABOLIC PANEL
ALT: 14 U/L (ref 0–44)
AST: 21 U/L (ref 15–41)
Albumin: 3.5 g/dL (ref 3.5–5.0)
Alkaline Phosphatase: 77 U/L (ref 38–126)
Anion gap: 9 (ref 5–15)
BUN: 10 mg/dL (ref 8–23)
CO2: 29 mmol/L (ref 22–32)
Calcium: 8.6 mg/dL — ABNORMAL LOW (ref 8.9–10.3)
Chloride: 100 mmol/L (ref 98–111)
Creatinine, Ser: 0.91 mg/dL (ref 0.44–1.00)
GFR, Estimated: >60 mL/min (ref >60)
Glucose, Bld: 121 mg/dL — ABNORMAL HIGH (ref 70–99)
Potassium: 3.5 mmol/L (ref 3.5–5.1)
Sodium: 138 mmol/L (ref 135–145)
Total Bilirubin: 1.1 mg/dL (ref 0.3–1.2)
Total Protein: 5.8 g/dL — ABNORMAL LOW (ref 6.5–8.1)

## 2021-07-18 LAB — CBC WITH DIFFERENTIAL/PLATELET
Abs Immature Granulocytes: 0.03 10*3/uL (ref 0.00–0.07)
Basophils Absolute: 0 10*3/uL (ref 0.0–0.1)
Basophils Relative: 1 %
Eosinophils Absolute: 0 10*3/uL (ref 0.0–0.5)
Eosinophils Relative: 0 %
HCT: 37.9 % (ref 36.0–46.0)
Hemoglobin: 12.1 g/dL (ref 12.0–15.0)
Immature Granulocytes: 1 %
Lymphocytes Relative: 25 %
Lymphs Abs: 1.1 10*3/uL (ref 0.7–4.0)
MCH: 30.9 pg (ref 26.0–34.0)
MCHC: 31.9 g/dL (ref 30.0–36.0)
MCV: 96.7 fL (ref 80.0–100.0)
Monocytes Absolute: 0.4 10*3/uL (ref 0.1–1.0)
Monocytes Relative: 8 %
Neutro Abs: 3 10*3/uL (ref 1.7–7.7)
Neutrophils Relative %: 65 %
Platelets: 153 10*3/uL (ref 150–400)
RBC: 3.92 MIL/uL (ref 3.87–5.11)
RDW: 16.5 % — ABNORMAL HIGH (ref 11.5–15.5)
WBC: 4.5 10*3/uL (ref 4.0–10.5)
nRBC: 0 % (ref 0.0–0.2)

## 2021-07-18 LAB — MAGNESIUM: Magnesium: 1.6 mg/dL — ABNORMAL LOW (ref 1.7–2.4)

## 2021-07-18 LAB — TSH: TSH: 113.451 u[IU]/mL — ABNORMAL HIGH (ref 0.350–4.500)

## 2021-07-18 LAB — TROPONIN I (HIGH SENSITIVITY): Troponin I (High Sensitivity): 21 ng/L — ABNORMAL HIGH (ref <18)

## 2021-07-18 LAB — BRAIN NATRIURETIC PEPTIDE: B Natriuretic Peptide: 209 pg/mL — ABNORMAL HIGH (ref 0.0–100.0)

## 2021-07-18 MED ORDER — SODIUM CHLORIDE 0.9% FLUSH
10.0000 mL | INTRAVENOUS | Status: DC | PRN
  Administered 2021-07-18: 09:00:00 10 mL via INTRAVENOUS

## 2021-07-18 MED ORDER — HEPARIN SOD (PORK) LOCK FLUSH 100 UNIT/ML IV SOLN
500.0000 [IU] | Freq: Once | INTRAVENOUS | Status: AC
  Administered 2021-07-18: 09:00:00 500 [IU] via INTRAVENOUS

## 2021-07-18 NOTE — Patient Instructions (Signed)
[facility]  Discharge Instructions: Thank you for choosing [facility] to provide your oncology and hematology care.  If you have a lab appointment with the Cancer Center, please come in thru the Main Entrance and check in at the main information desk.  Wear comfortable clothing and clothing appropriate for easy access to any Portacath or PICC line.   We strive to give you quality time with your provider. You may need to reschedule your appointment if you arrive late (15 or more minutes).  Arriving late affects you and other patients whose appointments are after yours.  Also, if you miss three or more appointments without notifying the office, you may be dismissed from the clinic at the provider's discretion.      For prescription refill requests, have your pharmacy contact our office and allow 72 hours for refills to be completed.        To help prevent nausea and vomiting after your treatment, we encourage you to take your nausea medication as directed.  BELOW ARE SYMPTOMS THAT SHOULD BE REPORTED IMMEDIATELY: *FEVER GREATER THAN 100.4 F (38 C) OR HIGHER *CHILLS OR SWEATING *NAUSEA AND VOMITING THAT IS NOT CONTROLLED WITH YOUR NAUSEA MEDICATION *UNUSUAL SHORTNESS OF BREATH *UNUSUAL BRUISING OR BLEEDING *URINARY PROBLEMS (pain or burning when urinating, or frequent urination) *BOWEL PROBLEMS (unusual diarrhea, constipation, pain near the anus) TENDERNESS IN MOUTH AND THROAT WITH OR WITHOUT PRESENCE OF ULCERS (sore throat, sores in mouth, or a toothache) UNUSUAL RASH, SWELLING OR PAIN  UNUSUAL VAGINAL DISCHARGE OR ITCHING   Items with * indicate a potential emergency and should be followed up as soon as possible or go to the Emergency Department if any problems should occur.  Please show the CHEMOTHERAPY ALERT CARD or IMMUNOTHERAPY ALERT CARD at check-in to the Emergency Department and triage nurse.  Should you have questions after your visit or need to cancel  or reschedule your appointment, please contact [facility] [phone]  and follow the prompts.  Office hours are 8:00 a.m. to 4:30 p.m. Monday - Friday. Please note that voicemails left after 4:00 p.m. may not be returned until the following business day.  We are closed weekends and major holidays. You have access to a nurse at all times for urgent questions. Please call the main number to the clinic [phone] and follow the prompts.  For any non-urgent questions, you may also contact your provider using MyChart. We now offer e-Visits for anyone 18 and older to request care online for non-urgent symptoms. For details visit [email or web address].   Also download the MyChart app! Go to the app store, search "MyChart", open the app, select [facility], and log in with your MyChart username and password.  Due to Covid, a mask is required upon entering the hospital/clinic. If you do not have a mask, one will be given to you upon arrival. For doctor visits, patients may have 1 support person aged 18 or older with them. For treatment visits, patients cannot have anyone with them due to current Covid guidelines and our immunocompromised population.  

## 2021-07-18 NOTE — Progress Notes (Signed)
Patients port flushed without difficulty.  Good blood return noted with no bruising or swelling noted at site.  Band aid applied.  VSS with discharge and left in satisfactory condition with no s/s of distress noted.   

## 2021-07-25 ENCOUNTER — Ambulatory Visit (HOSPITAL_COMMUNITY)
Admission: RE | Admit: 2021-07-25 | Discharge: 2021-07-25 | Disposition: A | Discharge status: Home / Self Care | Payer: Medicare Other | Source: Ambulatory Visit | Attending: Hematology | Admitting: Hematology

## 2021-07-25 ENCOUNTER — Other Ambulatory Visit: Payer: Self-pay

## 2021-07-25 ENCOUNTER — Ambulatory Visit (HOSPITAL_COMMUNITY): Payer: Medicare Other | Admitting: Hematology

## 2021-07-25 DIAGNOSIS — C4371 Malignant melanoma of right lower limb, including hip: Secondary | ICD-10-CM | POA: Diagnosis not present

## 2021-07-25 DIAGNOSIS — C439 Malignant melanoma of skin, unspecified: Secondary | ICD-10-CM | POA: Diagnosis not present

## 2021-07-25 MED ORDER — FLUDEOXYGLUCOSE F - 18 (FDG) INJECTION
7.3400 | Freq: Once | INTRAVENOUS | Status: AC | PRN
  Administered 2021-07-25: 7.34 via INTRAVENOUS

## 2021-07-30 ENCOUNTER — Ambulatory Visit (HOSPITAL_COMMUNITY): Payer: Medicare Other | Admitting: Hematology

## 2021-07-30 ENCOUNTER — Inpatient Hospital Stay (HOSPITAL_COMMUNITY): Payer: Medicare Other | Admitting: Hematology

## 2021-07-30 ENCOUNTER — Encounter (HOSPITAL_COMMUNITY): Payer: Self-pay

## 2021-07-30 ENCOUNTER — Other Ambulatory Visit: Payer: Self-pay

## 2021-07-30 VITALS — BP 138/65 | HR 65 | Temp 97.6°F | Resp 18 | Ht 62.0 in | Wt 182.6 lb

## 2021-07-30 DIAGNOSIS — C4371 Malignant melanoma of right lower limb, including hip: Secondary | ICD-10-CM | POA: Diagnosis not present

## 2021-07-30 DIAGNOSIS — Z79899 Other long term (current) drug therapy: Secondary | ICD-10-CM | POA: Diagnosis not present

## 2021-07-30 DIAGNOSIS — I129 Hypertensive chronic kidney disease with stage 1 through stage 4 chronic kidney disease, or unspecified chronic kidney disease: Secondary | ICD-10-CM | POA: Diagnosis not present

## 2021-07-30 DIAGNOSIS — I514 Myocarditis, unspecified: Secondary | ICD-10-CM | POA: Diagnosis not present

## 2021-07-30 DIAGNOSIS — E039 Hypothyroidism, unspecified: Secondary | ICD-10-CM | POA: Diagnosis not present

## 2021-07-30 DIAGNOSIS — N182 Chronic kidney disease, stage 2 (mild): Secondary | ICD-10-CM | POA: Diagnosis not present

## 2021-07-30 DIAGNOSIS — C7951 Secondary malignant neoplasm of bone: Secondary | ICD-10-CM | POA: Diagnosis not present

## 2021-07-30 DIAGNOSIS — Z800 Family history of malignant neoplasm of digestive organs: Secondary | ICD-10-CM | POA: Diagnosis not present

## 2021-07-30 DIAGNOSIS — Z803 Family history of malignant neoplasm of breast: Secondary | ICD-10-CM | POA: Diagnosis not present

## 2021-07-30 NOTE — Patient Instructions (Addendum)
[place] [facility] ?Discharge Instructions ? ?You were seen and examined today by Dr. Delton Coombes. He reviewed your most recent labs and scan and things have progressed some. Please keep follow up appointments as scheduled. ? ? ?Thank you for choosing [place] at [facility] to provide your oncology and hematology care.  To afford each patient quality time with our provider, please arrive at least 15 minutes before your scheduled appointment time.  ? ?If you have a lab appointment with the [facility] please come in thru the Main Entrance and check in at the main information desk. ? ?You need to re-schedule your appointment should you arrive 10 or more minutes late.  We strive to give you quality time with our providers, and arriving late affects you and other patients whose appointments are after yours.  Also, if you no show three or more times for appointments you may be dismissed from the clinic at the providers discretion.     ?Again, thank you for choosing [facility].  Our hope is that these requests will decrease the amount of time that you wait before being seen by our physicians.       ?_____________________________________________________________ ? ?Should you have questions after your visit to [facility], please contact our office at [phone] and follow the prompts.  Our office hours are 8:00 a.m. and 4:30 p.m. Monday - Friday.  Please note that voicemails left after 4:00 p.m. may not be returned until the following business day.  We are closed weekends and major holidays.  You do have access to a nurse 24-7, just call the main number to the clinic [phone] and do not press any options, hold on the line and a nurse will answer the phone.   ? ?For prescription refill requests, have your pharmacy contact our office and allow 72 hours.   ? ?Due to Covid, you will need to wear a mask upon entering the hospital. If you  do not have a mask, a mask will be given to you at the Main Entrance upon arrival. For doctor visits, patients may have 1 support person age 97 or older with them. For treatment visits, patients can not have anyone with them due to social distancing guidelines and our immunocompromised population.  ? ?  ?

## 2021-07-30 NOTE — Progress Notes (Signed)
? ?Elizabeth Norman ?[address] ?[place], [place] [address] ? ? ?CLINIC:  ?Medical Oncology/Hematology ? ?PCP:  ?Neale Burly, MD ?[place] / [place] [address] ?[phone] ? ? ?REASON FOR VISIT:  ?Follow-up for metastatic malignant melanoma, BRAF V600 negative ? ?PRIOR THERAPY: none ? ?NGS Results: not done ? ?CURRENT THERAPY: Opdualag (nivolumab/relatlimab) q28d ? ?BRIEF ONCOLOGIC HISTORY:  ?Oncology History  ?Malignant melanoma of lower leg, right ([place])  ?03/27/2021 Initial Diagnosis  ? Malignant melanoma of lower leg, right ([facility]) ?  ?05/09/2021 -  Chemotherapy  ? Patient is on Treatment Plan : MELANOMA - Opdualag (nivolumab/relatlimab) q28d  ?   ? ? ?CANCER STAGING: ? Cancer Staging  ?Malignant melanoma of lower leg, right ([facility]) ?Staging form: Melanoma of the Skin, AJCC 8th Edition ?- Clinical stage from 03/27/2021: Stage IV (cT4a, cN3, cM1) - Unsigned ? ? ?INTERVAL HISTORY:  ?[name], a 81 y.o. female, returns for routine follow-up of her metastatic malignant melanoma, BRAF V600 negative. Elizabeth Norman was last seen on 06/10/2021.  ? ?Today she reports feeling good, and she is accompanied by her son. She denies new pains. She reports occasional tingling at the site of her melanoma excision. Her son reports her mood has improved since stopping prednisone. She is not taking magnesium. She has a trip to [place] scheduled from 04/17-04/21.  ? ?REVIEW OF SYSTEMS:  ?Review of Systems  ?Constitutional:  Negative for appetite change and fatigue.  ?Psychiatric/Behavioral:  Negative for depression (improved).   ?All other systems reviewed and are negative. ? ?PAST MEDICAL/SURGICAL HISTORY:  ?Past Medical History:  ?Diagnosis Date  ? Arthritis   ? Hypertension   ? Malignant melanoma of lower leg, right ([place]) 03/27/2021  ? Port-A-Cath in place 04/30/2021  ? ?Past Surgical History:  ?Procedure Laterality Date  ? CATARACT EXTRACTION W/PHACO Right 10/03/2014  ? Procedure: CATARACT EXTRACTION PHACO AND  INTRAOCULAR LENS PLACEMENT (IOC);  Surgeon: [name], MD;  Location: [facility];  Service: Ophthalmology;  Laterality: Right;  CDE:10.09  ? CATARACT EXTRACTION W/PHACO Left 10/10/2014  ? Procedure: CATARACT EXTRACTION PHACO AND INTRAOCULAR LENS PLACEMENT (IOC);  Surgeon: [name], MD;  Location: [facility];  Service: Ophthalmology;  Laterality: Left;  CDE:8.69  ? CORNEAL 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  ? CORNEAL TRANSPLANT Left 2020  ? DENTAL RESTORATION/EXTRACTION WITH X-RAY    ? IR IMAGING GUIDED PORT INSERTION  04/05/2021  ? LEFT HEART CATH AND CORONARY ANGIOGRAPHY N/A 05/28/2021  ? Procedure: LEFT HEART CATH AND CORONARY ANGIOGRAPHY;  Surgeon: Nigel Mormon, MD;  Location: [facility];  Service: Cardiovascular;  Laterality: N/A;  ? MELANOMA EXCISION Right 02/12/2021  ? Procedure: WIDE LOCAL EXCISION RIGHT LOWER LEG MELANOMA , ADVANCEMENT FLAP CLOSURE DEFECT;  Surgeon: [name], MD;  Location: [facility];  Service: General;  Laterality: Right;  ? SENTINEL NODE BIOPSY Right 02/12/2021  ? Procedure: SENTINEL NODE BIOPSY;  Surgeon: [name], MD;  Location: [facility];  Service: General;  Laterality: Right;  ? ? ?SOCIAL HISTORY:  ?Social History  ? ?Socioeconomic History  ? Marital status: Widowed  ?  Spouse name: Not on file  ? Number of children: 3  ? Years of education: Not on file  ? Highest education level: Not on file  ?Occupational History  ? Not on file  ?Tobacco Use  ? Smoking status: Never  ? Smokeless tobacco: Never  ?Vaping Use  ? Vaping Use: Never used  ?Substance and Sexual Activity  ? Alcohol use:  No  ? Drug use: No  ? Sexual activity: Not on file  ?Other Topics Concern  ? Not on file  ?Social History Narrative  ? Not on file  ? ?Social Determinants of Health  ? ?Financial Resource Strain: Not on file  ?Food Insecurity: Not on file  ?Transportation Needs: Not on file  ?Physical Activity: Not on file  ?Stress: Not on file  ?Social Connections: Not on file  ?Intimate Partner Violence: Not on file   ? ? ?FAMILY HISTORY:  ?Family History  ?Problem Relation Age of Onset  ? Cancer Father   ? Hypertension Sister   ? Hypertension Sister   ? ? ?CURRENT MEDICATIONS:  ?Current Outpatient Medications  ?Medication Sig Dispense Refill  ? alendronate (FOSAMAX) 70 MG tablet Take 70 mg by mouth once a week.  0  ? ALPRAZolam (XANAX) 0.5 MG tablet Take 0.5 mg by mouth 3 (three) times daily.  2  ? aspirin EC 81 MG EC tablet Take 1 tablet (81 mg total) by mouth daily. Swallow whole. 30 tablet 11  ? calcium carbonate (OS-CAL - DOSED IN MG OF ELEMENTAL CALCIUM) 1250 (500 CA) MG tablet Take 1 tablet by mouth 3 (three) times a week.    ? cholecalciferol (VITAMIN D) 1000 UNITS tablet Take 1,000 Units by mouth 3 (three) times a week.    ? Cyanocobalamin (VITAMIN B-12 PO) Take 1 tablet by mouth 3 (three) times a week.    ? levothyroxine (SYNTHROID) 50 MCG tablet Take 1 tablet (50 mcg total) by mouth daily before breakfast. 30 tablet 3  ? prednisoLONE acetate (PRED FORTE) 1 % ophthalmic suspension Place 1 drop into both eyes See admin instructions. Instill one drop into the right eye twice daily and one drop into the left eye once daily.    ? predniSONE (DELTASONE) 20 MG tablet Take 3 tablets (60 mg total) by mouth daily with breakfast. 90 tablet 0  ? atorvastatin (LIPITOR) 40 MG tablet Take 1 tablet (40 mg total) by mouth daily. 30 tablet 0  ? metoprolol tartrate (LOPRESSOR) 25 MG tablet Take 1 tablet (25 mg total) by mouth 2 (two) times daily. 60 tablet 0  ? ?No current facility-administered medications for this visit.  ? ? ?ALLERGIES:  ?Allergies  ?Allergen Reactions  ? Tape   ?  Pulls skin, please use paper tape  ? ? ?PHYSICAL EXAM:  ?Performance status (ECOG): 0 - Asymptomatic ? ?Vitals:  ? 07/30/21 1509  ?BP: 138/65  ?Pulse: 65  ?Resp: 18  ?Temp: 97.6 ?F (36.4 ?C)  ?SpO2: 99%  ? ?Wt Readings from Last 3 Encounters:  ?07/30/21 182 lb 9.6 oz (82.8 kg)  ?06/24/21 177 lb 12.8 oz (80.6 kg)  ?06/19/21 180 lb (81.6 kg)  ? ?Physical  Exam ?Vitals reviewed.  ?Constitutional:   ?   Appearance: Normal appearance. She is obese.  ?Cardiovascular:  ?   Rate and Rhythm: Normal rate and regular rhythm.  ?   Pulses: Normal pulses.  ?   Heart sounds: Normal heart sounds.  ?Pulmonary:  ?   Effort: Pulmonary effort is normal.  ?   Breath sounds: Normal breath sounds.  ?Neurological:  ?   General: No focal deficit present.  ?   Mental Status: She is alert and oriented to person, place, and time.  ?Psychiatric:     ?   Mood and Affect: Mood normal.     ?   Behavior: Behavior normal.  ?  ? ?LABORATORY DATA:  ?I have reviewed  the labs as listed.  ?CBC Latest Ref Rng & Units 07/18/2021 06/24/2021 06/10/2021  ?WBC 4.0 - 10.5 K/uL 4.5 8.3 13.8(H)  ?Hemoglobin 12.0 - 15.0 g/dL 12.1 14.8 13.3  ?Hematocrit 36.0 - 46.0 % 37.9 45.4 40.0  ?Platelets 150 - 400 K/uL 153 158 228  ? ?CMP Latest Ref Rng & Units 07/18/2021 06/24/2021 06/10/2021  ?Glucose 70 - 99 mg/dL 121(H) 174(H) 154(H)  ?BUN 8 - 23 mg/dL _0 ?Creatinine 0.44 - 1.00 mg/dL 0.91 0.96 0.88  ?Sodium 135 - 145 mmol/L 138 132(L) 132(L)  ?Potassium 3.5 - 5.1 mmol/L 3.5 3.9 3.9  ?Chloride 98 - 111 mmol/L 100 95(L) 96(L)  ?CO2 22 - 32 mmol/L _1 ?Calcium 8.9 - 10.3 mg/dL 8.6(L) 9.0 8.2(L)  ?Total Protein 6.5 - 8.1 g/dL 5.8(L) 6.5 6.1(L)  ?Total Bilirubin 0.3 - 1.2 mg/dL 1.1 1.2 0.8  ?Alkaline Phos 38 - 126 U/L 77 67 77  ?AST 15 - 41 U/L _2 ?ALT 0 - 44 U/L _3 ? ? ?DIAGNOSTIC IMAGING:  ?I have independently reviewed the scans and discussed with the patient. ?NM PET Image Restage (PS) Whole Body ? ?Result Date: 07/26/2021 ?CLINICAL DATA:  Subsequent treatment strategy for metastatic melanoma. EXAM: NUCLEAR MEDICINE PET WHOLE BODY TECHNIQUE: 7.3 mCi F-18 FDG was injected intravenously. Full-ring PET imaging was performed from the head to foot after the radiotracer. CT data was obtained and used for attenuation correction and anatomic localization. Fasting blood glucose: 86 mg/dl COMPARISON:  None.  FINDINGS: Mediastinal blood pool activity: SUV max 2.8 HEAD/NECK: No hypermetabolic subcutaneous tissue in neck or scalp. Hypermetabolic lesion in the deep tissue of the RIGHT parotid gland measures 1 cm (image

## 2021-07-30 NOTE — Progress Notes (Signed)
ON PATHWAY REGIMEN - Melanoma and Other Skin Cancers ? ?No Change  Continue With Treatment as Ordered. ? ?Original Decision Date/Time: 04/25/2021 11:35 ? ? ?  A cycle is every 28 days: ?    Nivolumab and relatlimab-rmbw  ? ?**Always confirm dose/schedule in your pharmacy ordering system** ? ?Patient Characteristics: ?Melanoma, Cutaneous/Unknown Primary, Distant Metastases or Unresectable Local Recurrence, Unresectable, Asymptomatic, First Line, BRAF V600 Wild Type / BRAF V600 Results Pending or Unknown ?Disease Classification: Melanoma ?Disease Subtype: Cutaneous ?BRAF V600 Mutation Status: BRAF V600 Wild Type (No Mutation) ?Therapeutic Status: Distant Metastases ?Metastatic Disease Type: Asymptomatic ?Line of Therapy: First Line ?Intent of Therapy: ?Non-Curative / Palliative Intent, Discussed with Patient ?

## 2021-07-30 NOTE — Progress Notes (Signed)
Provided patient with written information regarding Keytruda as discussed with Dr. Delton Coombes. Patient and family encouraged to call with questions or concerns. ?

## 2021-07-30 NOTE — Progress Notes (Signed)
DISCONTINUE ON PATHWAY REGIMEN - Melanoma and Other Skin Cancers ? ? ?  A cycle is every 28 days: ?    Nivolumab and relatlimab-rmbw  ? ?**Always confirm dose/schedule in your pharmacy ordering system** ? ?REASON: Toxicities / Adverse Event ?PRIOR TREATMENT: MELOS103: Nivolumab 480 mg and Relatlimab 160 mg q28 Days Until Progression or Unacceptable Toxicity ?TREATMENT RESPONSE: Progressive Disease (PD) ? ?START ON PATHWAY REGIMEN - Melanoma and Other Skin Cancers ? ? ?  A cycle is every 21 days: ?    Pembrolizumab  ? ?**Always confirm dose/schedule in your pharmacy ordering system** ? ?Patient Characteristics: ?Melanoma, Cutaneous/Unknown Primary, Distant Metastases or Unresectable Local Recurrence, Unresectable, No Brain Metastases, Second Line, BRAF V600 Wild Type / BRAF V600 Results Pending or Unknown, Candidate for Immunotherapy ?Disease Classification: Melanoma ?Disease Subtype: Cutaneous ?BRAF V600 Mutation Status: BRAF V600 Wild Type (No Mutation) ?Therapeutic Status: Distant Metastases ?Line of Therapy: Second Line ?Immunotherapy Candidate Status: Candidate for Immunotherapy ?Intent of Therapy: ?Non-Curative / Palliative Intent, Discussed with Patient ?

## 2021-08-05 ENCOUNTER — Inpatient Hospital Stay (HOSPITAL_COMMUNITY): Payer: Medicare Other

## 2021-08-05 ENCOUNTER — Encounter (HOSPITAL_COMMUNITY): Payer: Self-pay

## 2021-08-05 ENCOUNTER — Other Ambulatory Visit: Payer: Self-pay

## 2021-08-05 VITALS — BP 128/74 | HR 84 | Temp 98.5°F | Resp 18 | Wt 185.0 lb

## 2021-08-05 DIAGNOSIS — C4371 Malignant melanoma of right lower limb, including hip: Secondary | ICD-10-CM

## 2021-08-05 DIAGNOSIS — I514 Myocarditis, unspecified: Secondary | ICD-10-CM | POA: Diagnosis not present

## 2021-08-05 DIAGNOSIS — Z803 Family history of malignant neoplasm of breast: Secondary | ICD-10-CM | POA: Diagnosis not present

## 2021-08-05 DIAGNOSIS — I129 Hypertensive chronic kidney disease with stage 1 through stage 4 chronic kidney disease, or unspecified chronic kidney disease: Secondary | ICD-10-CM | POA: Diagnosis not present

## 2021-08-05 DIAGNOSIS — Z95828 Presence of other vascular implants and grafts: Secondary | ICD-10-CM

## 2021-08-05 DIAGNOSIS — E039 Hypothyroidism, unspecified: Secondary | ICD-10-CM | POA: Diagnosis not present

## 2021-08-05 DIAGNOSIS — N182 Chronic kidney disease, stage 2 (mild): Secondary | ICD-10-CM | POA: Diagnosis not present

## 2021-08-05 DIAGNOSIS — Z79899 Other long term (current) drug therapy: Secondary | ICD-10-CM | POA: Diagnosis not present

## 2021-08-05 DIAGNOSIS — C7951 Secondary malignant neoplasm of bone: Secondary | ICD-10-CM | POA: Diagnosis not present

## 2021-08-05 DIAGNOSIS — Z800 Family history of malignant neoplasm of digestive organs: Secondary | ICD-10-CM | POA: Diagnosis not present

## 2021-08-05 LAB — TROPONIN I (HIGH SENSITIVITY): Troponin I (High Sensitivity): 14 ng/L (ref <18)

## 2021-08-05 LAB — TSH: TSH: 100.272 u[IU]/mL — ABNORMAL HIGH (ref 0.350–4.500)

## 2021-08-05 LAB — BRAIN NATRIURETIC PEPTIDE: B Natriuretic Peptide: 205 pg/mL — ABNORMAL HIGH (ref 0.0–100.0)

## 2021-08-05 MED ORDER — HEPARIN SOD (PORK) LOCK FLUSH 100 UNIT/ML IV SOLN
500.0000 [IU] | Freq: Once | INTRAVENOUS | Status: AC
  Administered 2021-08-05: 500 [IU] via INTRAVENOUS

## 2021-08-05 MED ORDER — SODIUM CHLORIDE 0.9% FLUSH
10.0000 mL | Freq: Once | INTRAVENOUS | Status: AC
  Administered 2021-08-05: 10 mL via INTRAVENOUS

## 2021-08-05 NOTE — Progress Notes (Signed)
Patient presents today for port flush/lab.  Port flushed with good blood return noted, labs drawn. No bruising or swelling at site. Bandaid applied and patient discharged in satisfactory condition. VVS stable with no signs or symptoms of distressed noted.  ?

## 2021-08-05 NOTE — Patient Instructions (Signed)
[facility]  Discharge Instructions: Thank you for choosing [facility] to provide your oncology and hematology care.  If you have a lab appointment with the Cancer Center, please come in thru the Main Entrance and check in at the main information desk.  Wear comfortable clothing and clothing appropriate for easy access to any Portacath or PICC line.   We strive to give you quality time with your provider. You may need to reschedule your appointment if you arrive late (15 or more minutes).  Arriving late affects you and other patients whose appointments are after yours.  Also, if you miss three or more appointments without notifying the office, you may be dismissed from the clinic at the provider's discretion.      For prescription refill requests, have your pharmacy contact our office and allow 72 hours for refills to be completed.    Today your port was flushed and labs were drawn, return as scheduled.   To help prevent nausea and vomiting after your treatment, we encourage you to take your nausea medication as directed.  BELOW ARE SYMPTOMS THAT SHOULD BE REPORTED IMMEDIATELY: *FEVER GREATER THAN 100.4 F (38 C) OR HIGHER *CHILLS OR SWEATING *NAUSEA AND VOMITING THAT IS NOT CONTROLLED WITH YOUR NAUSEA MEDICATION *UNUSUAL SHORTNESS OF BREATH *UNUSUAL BRUISING OR BLEEDING *URINARY PROBLEMS (pain or burning when urinating, or frequent urination) *BOWEL PROBLEMS (unusual diarrhea, constipation, pain near the anus) TENDERNESS IN MOUTH AND THROAT WITH OR WITHOUT PRESENCE OF ULCERS (sore throat, sores in mouth, or a toothache) UNUSUAL RASH, SWELLING OR PAIN  UNUSUAL VAGINAL DISCHARGE OR ITCHING   Items with * indicate a potential emergency and should be followed up as soon as possible or go to the Emergency Department if any problems should occur.  Please show the CHEMOTHERAPY ALERT CARD or IMMUNOTHERAPY ALERT CARD at check-in to the Emergency Department and triage  nurse.  Should you have questions after your visit or need to cancel or reschedule your appointment, please contact [facility] [phone]  and follow the prompts.  Office hours are 8:00 a.m. to 4:30 p.m. Monday - Friday. Please note that voicemails left after 4:00 p.m. may not be returned until the following business day.  We are closed weekends and major holidays. You have access to a nurse at all times for urgent questions. Please call the main number to the clinic [phone] and follow the prompts.  For any non-urgent questions, you may also contact your provider using MyChart. We now offer e-Visits for anyone 18 and older to request care online for non-urgent symptoms. For details visit [email or web address].   Also download the MyChart app! Go to the app store, search "MyChart", open the app, select [facility], and log in with your MyChart username and password.  Due to Covid, a mask is required upon entering the hospital/clinic. If you do not have a mask, one will be given to you upon arrival. For doctor visits, patients may have 1 support person aged 18 or older with them. For treatment visits, patients cannot have anyone with them due to current Covid guidelines and our immunocompromised population.  

## 2021-08-06 MED ORDER — LEVOTHYROXINE SODIUM 100 MCG PO TABS: 100.0000 ug | ORAL_TABLET | Freq: Every day | ORAL | 3 refills | Status: DC

## 2021-08-06 NOTE — Addendum Note (Signed)
Addended by: Derek Jack on: 08/06/2021 04:17 PM ? ? Modules accepted: Orders ? ?

## 2021-09-03 ENCOUNTER — Inpatient Hospital Stay (HOSPITAL_COMMUNITY): Payer: Medicare Other

## 2021-09-03 ENCOUNTER — Inpatient Hospital Stay (HOSPITAL_COMMUNITY): Payer: Medicare Other | Attending: Hematology | Admitting: Hematology

## 2021-09-03 ENCOUNTER — Other Ambulatory Visit (HOSPITAL_COMMUNITY): Payer: Self-pay | Admitting: *Deleted

## 2021-09-03 VITALS — BP 130/60 | HR 64 | Temp 98.0°F | Resp 18

## 2021-09-03 DIAGNOSIS — Z5112 Encounter for antineoplastic immunotherapy: Secondary | ICD-10-CM | POA: Insufficient documentation

## 2021-09-03 DIAGNOSIS — C7951 Secondary malignant neoplasm of bone: Secondary | ICD-10-CM | POA: Diagnosis not present

## 2021-09-03 DIAGNOSIS — Z79899 Other long term (current) drug therapy: Secondary | ICD-10-CM | POA: Diagnosis not present

## 2021-09-03 DIAGNOSIS — Z95828 Presence of other vascular implants and grafts: Secondary | ICD-10-CM

## 2021-09-03 DIAGNOSIS — C4371 Malignant melanoma of right lower limb, including hip: Secondary | ICD-10-CM | POA: Diagnosis not present

## 2021-09-03 DIAGNOSIS — Z8679 Personal history of other diseases of the circulatory system: Secondary | ICD-10-CM | POA: Diagnosis not present

## 2021-09-03 LAB — CBC WITH DIFFERENTIAL/PLATELET
Abs Immature Granulocytes: 0.03 10*3/uL (ref 0.00–0.07)
Basophils Absolute: 0 10*3/uL (ref 0.0–0.1)
Basophils Relative: 1 %
Eosinophils Absolute: 0.1 10*3/uL (ref 0.0–0.5)
Eosinophils Relative: 2 %
HCT: 37.7 % (ref 36.0–46.0)
Hemoglobin: 12.3 g/dL (ref 12.0–15.0)
Immature Granulocytes: 1 %
Lymphocytes Relative: 22 %
Lymphs Abs: 1.1 10*3/uL (ref 0.7–4.0)
MCH: 32.1 pg (ref 26.0–34.0)
MCHC: 32.6 g/dL (ref 30.0–36.0)
MCV: 98.4 fL (ref 80.0–100.0)
Monocytes Absolute: 0.4 10*3/uL (ref 0.1–1.0)
Monocytes Relative: 8 %
Neutro Abs: 3.4 10*3/uL (ref 1.7–7.7)
Neutrophils Relative %: 66 %
Platelets: 143 10*3/uL — ABNORMAL LOW (ref 150–400)
RBC: 3.83 MIL/uL — ABNORMAL LOW (ref 3.87–5.11)
RDW: 13.5 % (ref 11.5–15.5)
WBC: 5.1 10*3/uL (ref 4.0–10.5)
nRBC: 0 % (ref 0.0–0.2)

## 2021-09-03 LAB — COMPREHENSIVE METABOLIC PANEL
ALT: 12 U/L (ref 0–44)
AST: 23 U/L (ref 15–41)
Albumin: 3.8 g/dL (ref 3.5–5.0)
Alkaline Phosphatase: 75 U/L (ref 38–126)
Anion gap: 5 (ref 5–15)
BUN: 6 mg/dL — ABNORMAL LOW (ref 8–23)
CO2: 31 mmol/L (ref 22–32)
Calcium: 9.2 mg/dL (ref 8.9–10.3)
Chloride: 103 mmol/L (ref 98–111)
Creatinine, Ser: 0.83 mg/dL (ref 0.44–1.00)
GFR, Estimated: >60 mL/min (ref >60)
Glucose, Bld: 128 mg/dL — ABNORMAL HIGH (ref 70–99)
Potassium: 4.7 mmol/L (ref 3.5–5.1)
Sodium: 139 mmol/L (ref 135–145)
Total Bilirubin: 0.7 mg/dL (ref 0.3–1.2)
Total Protein: 6.7 g/dL (ref 6.5–8.1)

## 2021-09-03 LAB — TROPONIN I (HIGH SENSITIVITY): Troponin I (High Sensitivity): 15 ng/L (ref <18)

## 2021-09-03 LAB — TSH: TSH: 17.521 u[IU]/mL — ABNORMAL HIGH (ref 0.350–4.500)

## 2021-09-03 LAB — LACTATE DEHYDROGENASE: LDH: 161 U/L (ref 98–192)

## 2021-09-03 LAB — BRAIN NATRIURETIC PEPTIDE: B Natriuretic Peptide: 395 pg/mL — ABNORMAL HIGH (ref 0.0–100.0)

## 2021-09-03 MED ORDER — HEPARIN SOD (PORK) LOCK FLUSH 100 UNIT/ML IV SOLN
500.0000 [IU] | Freq: Once | INTRAVENOUS | Status: AC | PRN
  Administered 2021-09-03: 500 [IU]

## 2021-09-03 MED ORDER — SODIUM CHLORIDE 0.9 % IV SOLN
200.0000 mg | Freq: Once | INTRAVENOUS | Status: AC
  Administered 2021-09-03: 200 mg via INTRAVENOUS
  Filled 2021-09-03: qty 8

## 2021-09-03 MED ORDER — SODIUM CHLORIDE 0.9 % IV SOLN: Freq: Once | INTRAVENOUS | Status: AC

## 2021-09-03 MED ORDER — SODIUM CHLORIDE 0.9% FLUSH
10.0000 mL | INTRAVENOUS | Status: DC | PRN
  Administered 2021-09-03: 10 mL

## 2021-09-03 NOTE — Patient Instructions (Signed)
[facility]  Discharge Instructions: Thank you for choosing [facility] to provide your oncology and hematology care.  If you have a lab appointment with the Cancer Center, please come in thru the Main Entrance and check in at the main information desk.  Wear comfortable clothing and clothing appropriate for easy access to any Portacath or PICC line.   We strive to give you quality time with your provider. You may need to reschedule your appointment if you arrive late (15 or more minutes).  Arriving late affects you and other patients whose appointments are after yours.  Also, if you miss three or more appointments without notifying the office, you may be dismissed from the clinic at the provider's discretion.      For prescription refill requests, have your pharmacy contact our office and allow 72 hours for refills to be completed.    Today you received the following chemotherapy and/or immunotherapy agents Keytruda       To help prevent nausea and vomiting after your treatment, we encourage you to take your nausea medication as directed.  BELOW ARE SYMPTOMS THAT SHOULD BE REPORTED IMMEDIATELY: *FEVER GREATER THAN 100.4 F (38 C) OR HIGHER *CHILLS OR SWEATING *NAUSEA AND VOMITING THAT IS NOT CONTROLLED WITH YOUR NAUSEA MEDICATION *UNUSUAL SHORTNESS OF BREATH *UNUSUAL BRUISING OR BLEEDING *URINARY PROBLEMS (pain or burning when urinating, or frequent urination) *BOWEL PROBLEMS (unusual diarrhea, constipation, pain near the anus) TENDERNESS IN MOUTH AND THROAT WITH OR WITHOUT PRESENCE OF ULCERS (sore throat, sores in mouth, or a toothache) UNUSUAL RASH, SWELLING OR PAIN  UNUSUAL VAGINAL DISCHARGE OR ITCHING   Items with * indicate a potential emergency and should be followed up as soon as possible or go to the Emergency Department if any problems should occur.  Please show the CHEMOTHERAPY ALERT CARD or IMMUNOTHERAPY ALERT CARD at check-in to the Emergency  Department and triage nurse.  Should you have questions after your visit or need to cancel or reschedule your appointment, please contact [facility] [phone]  and follow the prompts.  Office hours are 8:00 a.m. to 4:30 p.m. Monday - Friday. Please note that voicemails left after 4:00 p.m. may not be returned until the following business day.  We are closed weekends and major holidays. You have access to a nurse at all times for urgent questions. Please call the main number to the clinic [phone] and follow the prompts.  For any non-urgent questions, you may also contact your provider using MyChart. We now offer e-Visits for anyone 18 and older to request care online for non-urgent symptoms. For details visit [email or web address].   Also download the MyChart app! Go to the app store, search "MyChart", open the app, select [facility], and log in with your MyChart username and password.  Due to Covid, a mask is required upon entering the hospital/clinic. If you do not have a mask, one will be given to you upon arrival. For doctor visits, patients may have 1 support person aged 18 or older with them. For treatment visits, patients cannot have anyone with them due to current Covid guidelines and our immunocompromised population.  

## 2021-09-03 NOTE — Progress Notes (Signed)
Patients port flushed without difficulty.  Good blood return noted with no bruising or swelling noted at site.  Stable during access.  Patient was stuck peripherally for labs.  Patient to remain accessed for treatment.  ?

## 2021-09-03 NOTE — Progress Notes (Signed)
Will proceed with treatment today per MD. Labs reviewed as well.  ? ?Treatment given per orders. Patient tolerated it well without problems. Vitals stable and discharged home from clinic ambulatory. Follow up as scheduled. ? ?

## 2021-09-03 NOTE — Progress Notes (Signed)
Pharmacist Chemotherapy Monitoring - Initial Assessment   ? ?Anticipated start date: 09/03/21  ? ?The following has been reviewed per standard work regarding the patient's treatment regimen: ?The patient's diagnosis, treatment plan and drug doses, and organ/hematologic function ?Lab orders and baseline tests specific to treatment regimen  ?The treatment plan start date, drug sequencing, and pre-medications ?Prior authorization status  ?Patient's documented medication list, including drug-drug interaction screen and prescriptions for anti-emetics and supportive care specific to the treatment regimen ?The drug concentrations, fluid compatibility, administration routes, and timing of the medications to be used ?The patient's access for treatment and lifetime cumulative dose history, if applicable  ?The patient's medication allergies and previous infusion related reactions, if applicable  ? ?Changes made to treatment plan:  ?N/A ? ?Follow up needed:  ?N/A ? ? ?Wynona Neat, [facility], ?09/03/2021  11:09 AM ? ?

## 2021-09-03 NOTE — Patient Instructions (Addendum)
[place] at [facility] ?Discharge Instructions ? ? ?You were seen and examined today by Dr. Delton Coombes. ? ?He reviewed your lab results.  ? ?We will proceed with treatment today with the immunotherapy drug called Keytruda. We will monitor you very closely with weekly lab work. And we will reinitiate steroids if you should have a reaction again to immunotherapy.  ? ?Return as scheduled.  ? ? ? ? ?Thank you for choosing [place] at [facility] to provide your oncology and hematology care.  To afford each patient quality time with our provider, please arrive at least 15 minutes before your scheduled appointment time.  ? ?If you have a lab appointment with the [place] please come in thru the Main Entrance and check in at the main information desk. ? ?You need to re-schedule your appointment should you arrive 10 or more minutes late.  We strive to give you quality time with our providers, and arriving late affects you and other patients whose appointments are after yours.  Also, if you no show three or more times for appointments you may be dismissed from the clinic at the providers discretion.     ?Again, thank you for choosing [facility].  Our hope is that these requests will decrease the amount of time that you wait before being seen by our physicians.       ?_____________________________________________________________ ? ?Should you have questions after your visit to [facility], please contact our office at [phone] and follow the prompts.  Our office hours are 8:00 a.m. and 4:30 p.m. Monday - Friday.  Please note that voicemails left after 4:00 p.m. may not be returned until the following business day.  We are closed weekends and major holidays.  You do have access to a nurse 24-7, just call the main number to the clinic [phone] and do not press any options, hold on the line and a nurse will answer the phone.   ? ?For  prescription refill requests, have your pharmacy contact our office and allow 72 hours.   ? ?Due to Covid, you will need to wear a mask upon entering the hospital. If you do not have a mask, a mask will be given to you at the Main Entrance upon arrival. For doctor visits, patients may have 1 support person age 40 or older with them. For treatment visits, patients can not have anyone with them due to social distancing guidelines and our immunocompromised population.  ? ?   ?

## 2021-09-03 NOTE — Progress Notes (Signed)
Treatment given today per MD orders. Tolerated infusion without adverse affects. Vital signs stable. No complaints at this time. Discharged from clinic ambulatory in stable condition. Alert and oriented x 3. F/U with [facility] as scheduled.   

## 2021-09-03 NOTE — Progress Notes (Signed)
Patient has been examined by Dr. Katragadda, and vital signs and labs have been reviewed. ANC, Creatinine, LFTs, hemoglobin, and platelets are within treatment parameters per M.D. - pt may proceed with treatment.    °

## 2021-09-03 NOTE — Progress Notes (Signed)
? ?[place] ?[address] ?[place], [place] [address] ? ? ?CLINIC:  ?Medical Oncology/Hematology ? ?PCP:  ?Neale Burly, MD ?[place] / [place] [address] ?[phone] ? ? ?REASON FOR VISIT:  ?Follow-up for metastatic malignant melanoma, BRAF V600 negative ? ?PRIOR THERAPY: none ? ?NGS Results: BRAF negative, positive for NRAS pathogenic variant exon 3, TERT promoter.  MSI-stable.  MMR-proficient.  [place] 1/2/3 fusion not detected.  KIT mutation negative.  PD-L1 negative. ? ?CURRENT THERAPY: surveillance ? ?BRIEF ONCOLOGIC HISTORY:  ?Oncology History  ?Malignant melanoma of lower leg, right ([place])  ?03/27/2021 Initial Diagnosis  ? Malignant melanoma of lower leg, right ([place]) ? ?  ?05/09/2021 - 05/09/2021 Chemotherapy  ? Patient is on Treatment Plan : MELANOMA - Opdualag (nivolumab/relatlimab) q28d  ? ?  ?  ?09/03/2021 -  Chemotherapy  ? Patient is on Treatment Plan : MELANOMA Pembrolizumab (200) q21d  ? ?  ?  ? ? ?CANCER STAGING: ? Cancer Staging  ?Malignant melanoma of lower leg, right ([place]) ?Staging form: Melanoma of the Skin, AJCC 8th Edition ?- Clinical stage from 03/27/2021: Stage IV (cT4a, cN3, cM1) - Unsigned ? ? ?INTERVAL HISTORY:  ?Elizabeth Norman, a 81 y.o. female, returns for routine follow-up and consideration for next cycle of chemotherapy. Elizabeth Norman was last seen on 07/30/2021. ? ?Due for cycle #1 of Keytruda today.  ? ?Overall, she tells me she has been feeling pretty well. She reports occasional stinging in her right leg which she reports does not occur daily, but she denies leg pains.  ? ?Overall, she feels ready for next cycle of chemo today.  ? ? ?REVIEW OF SYSTEMS:  ?Review of Systems  ?Constitutional:  Negative for appetite change and fatigue.  ?All other systems reviewed and are negative. ? ?PAST MEDICAL/SURGICAL HISTORY:  ?Past Medical History:  ?Diagnosis Date  ? Arthritis   ? Hypertension   ? Malignant melanoma of lower leg, right ([place]) 03/27/2021  ? Port-A-Cath in place  04/30/2021  ? ?Past Surgical History:  ?Procedure Laterality Date  ? CATARACT EXTRACTION W/PHACO Right 10/03/2014  ? Procedure: CATARACT EXTRACTION PHACO AND INTRAOCULAR LENS PLACEMENT (IOC);  Surgeon: [name], MD;  [facility];  Service: Ophthalmology;  Laterality: Right;  CDE:10.09  ? CATARACT EXTRACTION W/PHACO Left 10/10/2014  ? Procedure: CATARACT EXTRACTION PHACO AND INTRAOCULAR LENS PLACEMENT (IOC);  Surgeon: [name], MD;  Location: [facility];  Service: Ophthalmology;  Laterality: Left;  CDE:8.69  ? CORNEAL 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  ? CORNEAL TRANSPLANT Left 2020  ? DENTAL RESTORATION/EXTRACTION WITH X-RAY    ? IR IMAGING GUIDED PORT INSERTION  04/05/2021  ? LEFT HEART CATH AND CORONARY ANGIOGRAPHY N/A 05/28/2021  ? Procedure: LEFT HEART CATH AND CORONARY ANGIOGRAPHY;  Surgeon: Nigel Mormon, MD;  Location: [facility];  Service: Cardiovascular;  Laterality: N/A;  ? MELANOMA EXCISION Right 02/12/2021  ? Procedure: WIDE LOCAL EXCISION RIGHT LOWER LEG MELANOMA , ADVANCEMENT FLAP CLOSURE DEFECT;  Surgeon: [name], MD;  Location: [facility];  Service: General;  Laterality: Right;  ? SENTINEL NODE BIOPSY Right 02/12/2021  ? Procedure: SENTINEL NODE BIOPSY;  Surgeon: [name], MD;  Location: [facility];  Service: General;  Laterality: Right;  ? ? ?SOCIAL HISTORY:  ?Social History  ? ?Socioeconomic History  ? Marital status: Widowed  ?  Spouse name: Not on file  ? Number of children: 3  ? Years of education: Not on file  ? Highest education level: Not on  file  ?Occupational History  ? Not on file  ?Tobacco Use  ? Smoking status: Never  ? Smokeless tobacco: Never  ?Vaping Use  ? Vaping Use: Never used  ?Substance and Sexual Activity  ? Alcohol use: No  ? Drug use: No  ? Sexual activity: Not on file  ?Other Topics Concern  ? Not on file  ?Social History Narrative  ? Not on file  ? ?Social Determinants of Health  ? ?Financial Resource Strain: Not on file  ?Food Insecurity: Not on file   ?Transportation Needs: Not on file  ?Physical Activity: Not on file  ?Stress: Not on file  ?Social Connections: Not on file  ?Intimate Partner Violence: Not on file  ? ? ?FAMILY HISTORY:  ?Family History  ?Problem Relation Age of Onset  ? Cancer Father   ? Hypertension Sister   ? Hypertension Sister   ? ? ?CURRENT MEDICATIONS:  ?Current Outpatient Medications  ?Medication Sig Dispense Refill  ? alendronate (FOSAMAX) 70 MG tablet Take 70 mg by mouth once a week.  0  ? ALPRAZolam (XANAX) 0.5 MG tablet Take 0.5 mg by mouth 3 (three) times daily.  2  ? aspirin EC 81 MG EC tablet Take 1 tablet (81 mg total) by mouth daily. Swallow whole. 30 tablet 11  ? atorvastatin (LIPITOR) 40 MG tablet Take 1 tablet (40 mg total) by mouth daily. 30 tablet 0  ? calcium carbonate (OS-CAL - DOSED IN MG OF ELEMENTAL CALCIUM) 1250 (500 CA) MG tablet Take 1 tablet by mouth 3 (three) times a week.    ? cholecalciferol (VITAMIN D) 1000 UNITS tablet Take 1,000 Units by mouth 3 (three) times a week.    ? Cyanocobalamin (VITAMIN B-12 PO) Take 1 tablet by mouth 3 (three) times a week.    ? levothyroxine (SYNTHROID) 100 MCG tablet Take 1 tablet (100 mcg total) by mouth daily before breakfast. 30 tablet 3  ? metoprolol tartrate (LOPRESSOR) 25 MG tablet Take 1 tablet (25 mg total) by mouth 2 (two) times daily. 60 tablet 0  ? prednisoLONE acetate (PRED FORTE) 1 % ophthalmic suspension Place 1 drop into both eyes See admin instructions. Instill one drop into the right eye twice daily and one drop into the left eye once daily.    ? predniSONE (DELTASONE) 20 MG tablet Take 3 tablets (60 mg total) by mouth daily with breakfast. 90 tablet 0  ? ?No current facility-administered medications for this visit.  ? ? ?ALLERGIES:  ?Allergies  ?Allergen Reactions  ? Tape   ?  Pulls skin, please use paper tape  ? ? ?PHYSICAL EXAM:  ?Performance status (ECOG): 0 - Asymptomatic ? ?There were no vitals filed for this visit. ?Wt Readings from Last 3 Encounters:   ?08/05/21 185 lb (83.9 kg)  ?07/30/21 182 lb 9.6 oz (82.8 kg)  ?06/24/21 177 lb 12.8 oz (80.6 kg)  ? ?Physical Exam ?Vitals reviewed.  ?Constitutional:   ?   Appearance: Normal appearance. She is obese.  ?Cardiovascular:  ?   Rate and Rhythm: Normal rate and regular rhythm.  ?   Pulses: Normal pulses.  ?   Heart sounds: Normal heart sounds.  ?Pulmonary:  ?   Effort: Pulmonary effort is normal.  ?   Breath sounds: Normal breath sounds.  ?Neurological:  ?   General: No focal deficit present.  ?   Mental Status: She is alert and oriented to person, place, and time.  ?Psychiatric:     ?   Mood and  Affect: Mood normal.     ?   Behavior: Behavior normal.  ? ? ?LABORATORY DATA:  ?I have reviewed the labs as listed.  ? ?  Latest Ref Rng & Units 07/18/2021  ?  8:49 AM 06/24/2021  ? 12:18 PM 06/10/2021  ?  7:57 AM  ?CBC  ?WBC 4.0 - 10.5 K/uL 4.5   8.3   13.8    ?Hemoglobin 12.0 - 15.0 g/dL 12.1   14.8   13.3    ?Hematocrit 36.0 - 46.0 % 37.9   45.4   40.0    ?Platelets 150 - 400 K/uL 153   158   228    ? ? ?  Latest Ref Rng & Units 07/18/2021  ?  8:49 AM 06/24/2021  ? 12:18 PM 06/10/2021  ?  7:57 AM  ?CMP  ?Glucose 70 - 99 mg/dL 121   174   154    ?BUN 8 - 23 mg/dL 10   20   20     ?Creatinine 0.44 - 1.00 mg/dL 0.91   0.96   0.88    ?Sodium 135 - 145 mmol/L 138   132   132    ?Potassium 3.5 - 5.1 mmol/L 3.5   3.9   3.9    ?Chloride 98 - 111 mmol/L 100   95   96    ?CO2 22 - 32 mmol/L 29   26   30     ?Calcium 8.9 - 10.3 mg/dL 8.6   9.0   8.2    ?Total Protein 6.5 - 8.1 g/dL 5.8   6.5   6.1    ?Total Bilirubin 0.3 - 1.2 mg/dL 1.1   1.2   0.8    ?Alkaline Phos 38 - 126 U/L 77   67   77    ?AST 15 - 41 U/L 21   24   20     ?ALT 0 - 44 U/L 14   22   17     ? ? ?DIAGNOSTIC IMAGING:  ?I have independently reviewed the scans and discussed with the patient. ?No results found.  ? ?ASSESSMENT:  ?Malignant melanoma of right posterior leg (pT4 pN3 M1): ?- She noticed fleshy lesion 4 months ago on the right leg. ?- Her PMD did a biopsy which was  consistent with melanoma. ?- Wide local excision and lymph node biopsy by Dr. Barry Dienes on 02/12/2021. ?- Pathology margins free, nodular type, Breslow's thickness 9.52 mm, Clark level V, deep margins free, ulceration absen

## 2021-09-04 ENCOUNTER — Telehealth (HOSPITAL_COMMUNITY): Payer: Self-pay

## 2021-09-04 NOTE — Telephone Encounter (Signed)
Chemotherapy 24 hour follow up call.  Patient stated she is doing well with no complaints.  Reviewed triage telephone call line with understanding verbalized.   ?

## 2021-09-10 ENCOUNTER — Inpatient Hospital Stay (HOSPITAL_COMMUNITY): Payer: Medicare Other | Admitting: Hematology

## 2021-09-10 ENCOUNTER — Other Ambulatory Visit: Payer: Self-pay

## 2021-09-10 ENCOUNTER — Inpatient Hospital Stay (HOSPITAL_COMMUNITY): Payer: Medicare Other | Attending: Hematology

## 2021-09-10 VITALS — BP 143/79 | HR 65 | Temp 98.0°F | Resp 18 | Ht 62.0 in | Wt 182.0 lb

## 2021-09-10 DIAGNOSIS — I409 Acute myocarditis, unspecified: Secondary | ICD-10-CM

## 2021-09-10 DIAGNOSIS — Z79899 Other long term (current) drug therapy: Secondary | ICD-10-CM | POA: Diagnosis not present

## 2021-09-10 DIAGNOSIS — C4371 Malignant melanoma of right lower limb, including hip: Secondary | ICD-10-CM | POA: Diagnosis not present

## 2021-09-10 DIAGNOSIS — Z800 Family history of malignant neoplasm of digestive organs: Secondary | ICD-10-CM | POA: Insufficient documentation

## 2021-09-10 DIAGNOSIS — N189 Chronic kidney disease, unspecified: Secondary | ICD-10-CM | POA: Insufficient documentation

## 2021-09-10 DIAGNOSIS — E039 Hypothyroidism, unspecified: Secondary | ICD-10-CM | POA: Insufficient documentation

## 2021-09-10 DIAGNOSIS — Z803 Family history of malignant neoplasm of breast: Secondary | ICD-10-CM | POA: Diagnosis not present

## 2021-09-10 DIAGNOSIS — C7951 Secondary malignant neoplasm of bone: Secondary | ICD-10-CM | POA: Insufficient documentation

## 2021-09-10 DIAGNOSIS — Z5112 Encounter for antineoplastic immunotherapy: Secondary | ICD-10-CM | POA: Diagnosis not present

## 2021-09-10 DIAGNOSIS — I214 Non-ST elevation (NSTEMI) myocardial infarction: Secondary | ICD-10-CM | POA: Diagnosis not present

## 2021-09-10 DIAGNOSIS — I514 Myocarditis, unspecified: Secondary | ICD-10-CM | POA: Insufficient documentation

## 2021-09-10 DIAGNOSIS — Z95828 Presence of other vascular implants and grafts: Secondary | ICD-10-CM

## 2021-09-10 DIAGNOSIS — I129 Hypertensive chronic kidney disease with stage 1 through stage 4 chronic kidney disease, or unspecified chronic kidney disease: Secondary | ICD-10-CM | POA: Diagnosis not present

## 2021-09-10 LAB — CBC WITH DIFFERENTIAL/PLATELET
Abs Immature Granulocytes: 0.01 10*3/uL (ref 0.00–0.07)
Basophils Absolute: 0 10*3/uL (ref 0.0–0.1)
Basophils Relative: 1 %
Eosinophils Absolute: 0.1 10*3/uL (ref 0.0–0.5)
Eosinophils Relative: 2 %
HCT: 38.2 % (ref 36.0–46.0)
Hemoglobin: 12.6 g/dL (ref 12.0–15.0)
Immature Granulocytes: 0 %
Lymphocytes Relative: 23 %
Lymphs Abs: 1.2 10*3/uL (ref 0.7–4.0)
MCH: 31.9 pg (ref 26.0–34.0)
MCHC: 33 g/dL (ref 30.0–36.0)
MCV: 96.7 fL (ref 80.0–100.0)
Monocytes Absolute: 0.4 10*3/uL (ref 0.1–1.0)
Monocytes Relative: 7 %
Neutro Abs: 3.5 10*3/uL (ref 1.7–7.7)
Neutrophils Relative %: 67 %
Platelets: 169 10*3/uL (ref 150–400)
RBC: 3.95 MIL/uL (ref 3.87–5.11)
RDW: 12.9 % (ref 11.5–15.5)
WBC: 5.2 10*3/uL (ref 4.0–10.5)
nRBC: 0 % (ref 0.0–0.2)

## 2021-09-10 LAB — COMPREHENSIVE METABOLIC PANEL
ALT: 12 U/L (ref 0–44)
AST: 21 U/L (ref 15–41)
Albumin: 3.9 g/dL (ref 3.5–5.0)
Alkaline Phosphatase: 71 U/L (ref 38–126)
Anion gap: 7 (ref 5–15)
BUN: 10 mg/dL (ref 8–23)
CO2: 28 mmol/L (ref 22–32)
Calcium: 9.2 mg/dL (ref 8.9–10.3)
Chloride: 101 mmol/L (ref 98–111)
Creatinine, Ser: 0.8 mg/dL (ref 0.44–1.00)
GFR, Estimated: >60 mL/min (ref >60)
Glucose, Bld: 127 mg/dL — ABNORMAL HIGH (ref 70–99)
Potassium: 4 mmol/L (ref 3.5–5.1)
Sodium: 136 mmol/L (ref 135–145)
Total Bilirubin: 1.2 mg/dL (ref 0.3–1.2)
Total Protein: 6.6 g/dL (ref 6.5–8.1)

## 2021-09-10 LAB — TROPONIN I (HIGH SENSITIVITY): Troponin I (High Sensitivity): 13 ng/L (ref <18)

## 2021-09-10 LAB — BRAIN NATRIURETIC PEPTIDE: B Natriuretic Peptide: 265 pg/mL — ABNORMAL HIGH (ref 0.0–100.0)

## 2021-09-10 LAB — TSH: TSH: 9.418 u[IU]/mL — ABNORMAL HIGH (ref 0.350–4.500)

## 2021-09-10 MED ORDER — SODIUM CHLORIDE 0.9% FLUSH
10.0000 mL | Freq: Once | INTRAVENOUS | Status: AC
  Administered 2021-09-10: 10 mL via INTRAVENOUS

## 2021-09-10 MED ORDER — HEPARIN SOD (PORK) LOCK FLUSH 100 UNIT/ML IV SOLN
500.0000 [IU] | Freq: Once | INTRAVENOUS | Status: AC
  Administered 2021-09-10: 500 [IU] via INTRAVENOUS

## 2021-09-10 NOTE — Patient Instructions (Signed)
[place] at [facility] ?Discharge Instructions ? ? ?You were seen and examined today by Dr. Delton Coombes. ? ?He reviewed your lab work which is  ? ? ?Thank you for choosing [place] at [facility] to provide your oncology and hematology care.  To afford each patient quality time with our provider, please arrive at least 15 minutes before your scheduled appointment time.  ? ?If you have a lab appointment with the [facility] please come in thru the Main Entrance and check in at the main information desk. ? ?You need to re-schedule your appointment should you arrive 10 or more minutes late.  We strive to give you quality time with our providers, and arriving late affects you and other patients whose appointments are after yours.  Also, if you no show three or more times for appointments you may be dismissed from the clinic at the providers discretion.     ?Again, thank you for choosing [facility].  Our hope is that these requests will decrease the amount of time that you wait before being seen by our physicians.       ?_____________________________________________________________ ? ?Should you have questions after your visit to [facility], please contact our office at [phone] and follow the prompts.  Our office hours are 8:00 a.m. and 4:30 p.m. Monday - Friday.  Please note that voicemails left after 4:00 p.m. may not be returned until the following business day.  We are closed weekends and major holidays.  You do have access to a nurse 24-7, just call the main number to the clinic [phone] and do not press any options, hold on the line and a nurse will answer the phone.   ? ?For prescription refill requests, have your pharmacy contact our office and allow 72 hours.   ? ?Due to Covid, you will need to wear a mask upon entering the hospital. If you do not have a mask, a mask will be given to you at the Main Entrance upon arrival.  For doctor visits, patients may have 1 support person age 99 or older with them. For treatment visits, patients can not have anyone with them due to social distancing guidelines and our immunocompromised population.  ? ?   ?

## 2021-09-10 NOTE — Progress Notes (Signed)
? ?[facility] ?[address] ?[place], [place] [address] ? ? ?CLINIC:  ?Medical Oncology/Hematology ? ?PCP:  ?Neale Burly, MD ?[place] / [place] [address] ?[phone] ? ? ?REASON FOR VISIT:  ?Follow-up for metastatic malignant melanoma, BRAF V600 negative ? ?PRIOR THERAPY: none ? ?NGS Results: BRAF negative, positive for NRAS pathogenic variant exon 3, TERT promoter.  MSI-stable.  MMR-proficient.  [place] 1/2/3 fusion not detected.  KIT mutation negative.  PD-L1 negative. ? ?CURRENT THERAPY: surveillance ? ?BRIEF ONCOLOGIC HISTORY:  ?Oncology History  ?Malignant melanoma of lower leg, right ([place])  ?03/27/2021 Initial Diagnosis  ? Malignant melanoma of lower leg, right ([name]) ? ?  ?05/09/2021 - 05/09/2021 Chemotherapy  ? Patient is on Treatment Plan : MELANOMA - Opdualag (nivolumab/relatlimab) q28d  ? ?  ?  ?09/03/2021 -  Chemotherapy  ? Patient is on Treatment Plan : MELANOMA Pembrolizumab (200) q21d  ? ?  ?  ? ? ?CANCER STAGING: ? Cancer Staging  ?Malignant melanoma of lower leg, right ([name]) ?Staging form: Melanoma of the Skin, AJCC 8th Edition ?- Clinical stage from 03/27/2021: Stage IV (cT4a, cN3, cM1) - Unsigned ? ? ?INTERVAL HISTORY:  ?Elizabeth Norman, a 81 y.o. female, returns for routine follow-up of her metastatic malignant melanoma, BRAF V600 negative. Elizabeth Norman was last seen on 09/03/2021.  ? ?Today she reports feeling good. She denies current pains. She is eating well, and she denies diarrhea, skin rash, headaches, and ankle swellings.  ? ?REVIEW OF SYSTEMS:  ?Review of Systems  ?Constitutional:  Negative for appetite change and fatigue.  ?Gastrointestinal:  Negative for diarrhea.  ?Skin:  Negative for rash.  ?Neurological:  Negative for headaches.  ?All other systems reviewed and are negative. ? ?PAST MEDICAL/SURGICAL HISTORY:  ?Past Medical History:  ?Diagnosis Date  ? Arthritis   ? Hypertension   ? Malignant melanoma of lower leg, right ([name]) 03/27/2021  ? Port-A-Cath in place  04/30/2021  ? ?Past Surgical History:  ?Procedure Laterality Date  ? CATARACT EXTRACTION W/PHACO Right 10/03/2014  ? Procedure: CATARACT EXTRACTION PHACO AND INTRAOCULAR LENS PLACEMENT (IOC);  Surgeon: [name], MD;  [facility]: [facility];  Service: Ophthalmology;  Laterality: Right;  CDE:10.09  ? CATARACT EXTRACTION W/PHACO Left 10/10/2014  ? Procedure: CATARACT EXTRACTION PHACO AND INTRAOCULAR LENS PLACEMENT (IOC);  Surgeon: [name], MD;  [facility]: [facility];  Service: Ophthalmology;  Laterality: Left;  CDE:8.69  ? CORNEAL 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  ? CORNEAL TRANSPLANT Left 2020  ? DENTAL RESTORATION/EXTRACTION WITH X-RAY    ? IR IMAGING GUIDED PORT INSERTION  04/05/2021  ? LEFT HEART CATH AND CORONARY ANGIOGRAPHY N/A 05/28/2021  ? Procedure: LEFT HEART CATH AND CORONARY ANGIOGRAPHY;  Surgeon: Nigel Mormon, MD;  Location: [facility];  Service: Cardiovascular;  Laterality: N/A;  ? MELANOMA EXCISION Right 02/12/2021  ? Procedure: WIDE LOCAL EXCISION RIGHT LOWER LEG MELANOMA , ADVANCEMENT FLAP CLOSURE DEFECT;  Surgeon: [name], MD;  Location: [facility];  Service: General;  Laterality: Right;  ? SENTINEL NODE BIOPSY Right 02/12/2021  ? Procedure: SENTINEL NODE BIOPSY;  Surgeon: [name], MD;  Location: [facility];  Service: General;  Laterality: Right;  ? ? ?SOCIAL HISTORY:  ?Social History  ? ?Socioeconomic History  ? Marital status: Widowed  ?  Spouse name: Not on file  ? Number of children: 3  ? Years of education: Not on file  ? Highest education level: Not on file  ?Occupational History  ? Not on file  ?  Tobacco Use  ? Smoking status: Never  ? Smokeless tobacco: Never  ?Vaping Use  ? Vaping Use: Never used  ?Substance and Sexual Activity  ? Alcohol use: No  ? Drug use: No  ? Sexual activity: Not on file  ?Other Topics Concern  ? Not on file  ?Social History Narrative  ? Not on file  ? ?Social Determinants of Health  ? ?Financial Resource Strain: Not on file  ?Food Insecurity: Not on file   ?Transportation Needs: Not on file  ?Physical Activity: Not on file  ?Stress: Not on file  ?Social Connections: Not on file  ?Intimate Partner Violence: Not on file  ? ? ?FAMILY HISTORY:  ?Family History  ?Problem Relation Age of Onset  ? Cancer Father   ? Hypertension Sister   ? Hypertension Sister   ? ? ?CURRENT MEDICATIONS:  ?Current Outpatient Medications  ?Medication Sig Dispense Refill  ? alendronate (FOSAMAX) 70 MG tablet Take 70 mg by mouth once a week.  0  ? ALPRAZolam (XANAX) 0.5 MG tablet Take 0.5 mg by mouth 3 (three) times daily.  2  ? aspirin EC 81 MG EC tablet Take 1 tablet (81 mg total) by mouth daily. Swallow whole. 30 tablet 11  ? calcium carbonate (OS-CAL - DOSED IN MG OF ELEMENTAL CALCIUM) 1250 (500 CA) MG tablet Take 1 tablet by mouth 3 (three) times a week.    ? cholecalciferol (VITAMIN D) 1000 UNITS tablet Take 1,000 Units by mouth 3 (three) times a week.    ? Cyanocobalamin (VITAMIN B-12 PO) Take 1 tablet by mouth 3 (three) times a week.    ? levothyroxine (SYNTHROID) 100 MCG tablet Take 1 tablet (100 mcg total) by mouth daily before breakfast. 30 tablet 3  ? prednisoLONE acetate (PRED FORTE) 1 % ophthalmic suspension Place 1 drop into both eyes See admin instructions. Instill one drop into the right eye twice daily and one drop into the left eye once daily.    ? atorvastatin (LIPITOR) 40 MG tablet Take 1 tablet (40 mg total) by mouth daily. 30 tablet 0  ? metoprolol tartrate (LOPRESSOR) 25 MG tablet Take 1 tablet (25 mg total) by mouth 2 (two) times daily. 60 tablet 0  ? ?No current facility-administered medications for this visit.  ? ? ?ALLERGIES:  ?Allergies  ?Allergen Reactions  ? Tape   ?  Pulls skin, please use paper tape  ? ? ?PHYSICAL EXAM:  ?Performance status (ECOG): 0 - Asymptomatic ? ?Vitals:  ? 09/10/21 1057  ?BP: (!) 143/79  ?Pulse: 65  ?Resp: 18  ?Temp: 98 ?F (36.7 ?C)  ?SpO2: 97%  ? ?Wt Readings from Last 3 Encounters:  ?09/10/21 182 lb (82.6 kg)  ?09/03/21 183 lb 12.8 oz  (83.4 kg)  ?08/05/21 185 lb (83.9 kg)  ? ?Physical Exam ?Vitals reviewed.  ?Constitutional:   ?   Appearance: Normal appearance. She is obese.  ?Cardiovascular:  ?   Rate and Rhythm: Normal rate and regular rhythm.  ?   Pulses: Normal pulses.  ?   Heart sounds: Normal heart sounds.  ?Pulmonary:  ?   Effort: Pulmonary effort is normal.  ?   Breath sounds: Normal breath sounds.  ?Musculoskeletal:  ?   Right lower leg: No edema.  ?   Left lower leg: No edema.  ?Neurological:  ?   General: No focal deficit present.  ?   Mental Status: She is alert and oriented to person, place, and time.  ?Psychiatric:     ?  Mood and Affect: Mood normal.     ?   Behavior: Behavior normal.  ?  ? ?LABORATORY DATA:  ?I have reviewed the labs as listed.  ? ?  Latest Ref Rng & Units 09/10/2021  ? 10:39 AM 09/03/2021  ?  8:50 AM 07/18/2021  ?  8:49 AM  ?CBC  ?WBC 4.0 - 10.5 K/uL 5.2   5.1   4.5    ?Hemoglobin 12.0 - 15.0 g/dL 12.6   12.3   12.1    ?Hematocrit 36.0 - 46.0 % 38.2   37.7   37.9    ?Platelets 150 - 400 K/uL 169   143   153    ? ? ?  Latest Ref Rng & Units 09/03/2021  ?  8:50 AM 07/18/2021  ?  8:49 AM 06/24/2021  ? 12:18 PM  ?CMP  ?Glucose 70 - 99 mg/dL 128   121   174    ?BUN 8 - 23 mg/dL 6   10   20     ?Creatinine 0.44 - 1.00 mg/dL 0.83   0.91   0.96    ?Sodium 135 - 145 mmol/L 139   138   132    ?Potassium 3.5 - 5.1 mmol/L 4.7   3.5   3.9    ?Chloride 98 - 111 mmol/L 103   100   95    ?CO2 22 - 32 mmol/L 31   29   26     ?Calcium 8.9 - 10.3 mg/dL 9.2   8.6   9.0    ?Total Protein 6.5 - 8.1 g/dL 6.7   5.8   6.5    ?Total Bilirubin 0.3 - 1.2 mg/dL 0.7   1.1   1.2    ?Alkaline Phos 38 - 126 U/L 75   77   67    ?AST 15 - 41 U/L 23   21   24     ?ALT 0 - 44 U/L 12   14   22     ? ? ?DIAGNOSTIC IMAGING:  ?I have independently reviewed the scans and discussed with the patient. ?No results found.  ? ?ASSESSMENT:  ?Malignant melanoma of right posterior leg (pT4 pN3 M1): ?- She noticed fleshy lesion 4 months ago on the right leg. ?- Her PMD did a  biopsy which was consistent with melanoma. ?- Wide local excision and lymph node biopsy by Dr. Barry Dienes on 02/12/2021. ?- Pathology margins free, nodular type, Breslow's thickness 9.52 mm, Clark level V, deep margins fre

## 2021-09-17 ENCOUNTER — Other Ambulatory Visit: Payer: Self-pay

## 2021-09-17 ENCOUNTER — Inpatient Hospital Stay (HOSPITAL_COMMUNITY): Payer: Medicare Other | Admitting: Physician Assistant

## 2021-09-17 ENCOUNTER — Other Ambulatory Visit (HOSPITAL_COMMUNITY): Payer: Self-pay

## 2021-09-17 ENCOUNTER — Inpatient Hospital Stay (HOSPITAL_COMMUNITY): Payer: Medicare Other

## 2021-09-17 VITALS — BP 113/63 | HR 61 | Temp 98.1°F | Resp 18 | Ht 62.0 in | Wt 180.4 lb

## 2021-09-17 DIAGNOSIS — E039 Hypothyroidism, unspecified: Secondary | ICD-10-CM | POA: Diagnosis not present

## 2021-09-17 DIAGNOSIS — C4371 Malignant melanoma of right lower limb, including hip: Secondary | ICD-10-CM

## 2021-09-17 DIAGNOSIS — Z803 Family history of malignant neoplasm of breast: Secondary | ICD-10-CM | POA: Diagnosis not present

## 2021-09-17 DIAGNOSIS — C7951 Secondary malignant neoplasm of bone: Secondary | ICD-10-CM | POA: Diagnosis not present

## 2021-09-17 DIAGNOSIS — Z298 Encounter for other specified prophylactic measures: Secondary | ICD-10-CM | POA: Diagnosis not present

## 2021-09-17 DIAGNOSIS — I129 Hypertensive chronic kidney disease with stage 1 through stage 4 chronic kidney disease, or unspecified chronic kidney disease: Secondary | ICD-10-CM | POA: Diagnosis not present

## 2021-09-17 DIAGNOSIS — I409 Acute myocarditis, unspecified: Secondary | ICD-10-CM

## 2021-09-17 DIAGNOSIS — I214 Non-ST elevation (NSTEMI) myocardial infarction: Secondary | ICD-10-CM

## 2021-09-17 DIAGNOSIS — Z79899 Other long term (current) drug therapy: Secondary | ICD-10-CM | POA: Diagnosis not present

## 2021-09-17 DIAGNOSIS — Z5112 Encounter for antineoplastic immunotherapy: Secondary | ICD-10-CM | POA: Diagnosis not present

## 2021-09-17 DIAGNOSIS — N189 Chronic kidney disease, unspecified: Secondary | ICD-10-CM | POA: Diagnosis not present

## 2021-09-17 DIAGNOSIS — Z800 Family history of malignant neoplasm of digestive organs: Secondary | ICD-10-CM | POA: Diagnosis not present

## 2021-09-17 DIAGNOSIS — I514 Myocarditis, unspecified: Secondary | ICD-10-CM | POA: Diagnosis not present

## 2021-09-17 LAB — COMPREHENSIVE METABOLIC PANEL
ALT: 12 U/L (ref 0–44)
AST: 21 U/L (ref 15–41)
Albumin: 3.7 g/dL (ref 3.5–5.0)
Alkaline Phosphatase: 68 U/L (ref 38–126)
Anion gap: 7 (ref 5–15)
BUN: 10 mg/dL (ref 8–23)
CO2: 28 mmol/L (ref 22–32)
Calcium: 8.9 mg/dL (ref 8.9–10.3)
Chloride: 103 mmol/L (ref 98–111)
Creatinine, Ser: 0.89 mg/dL (ref 0.44–1.00)
GFR, Estimated: >60 mL/min (ref >60)
Glucose, Bld: 125 mg/dL — ABNORMAL HIGH (ref 70–99)
Potassium: 3.9 mmol/L (ref 3.5–5.1)
Sodium: 138 mmol/L (ref 135–145)
Total Bilirubin: 0.9 mg/dL (ref 0.3–1.2)
Total Protein: 6.6 g/dL (ref 6.5–8.1)

## 2021-09-17 LAB — TSH: TSH: 4.625 u[IU]/mL — ABNORMAL HIGH (ref 0.350–4.500)

## 2021-09-17 LAB — BRAIN NATRIURETIC PEPTIDE: B Natriuretic Peptide: 204 pg/mL — ABNORMAL HIGH (ref 0.0–100.0)

## 2021-09-17 LAB — TROPONIN I (HIGH SENSITIVITY): Troponin I (High Sensitivity): 13 ng/L (ref <18)

## 2021-09-17 MED ORDER — SODIUM CHLORIDE 0.9% FLUSH
10.0000 mL | Freq: Once | INTRAVENOUS | Status: AC
  Administered 2021-09-17: 10 mL via INTRAVENOUS

## 2021-09-17 MED ORDER — HEPARIN SOD (PORK) LOCK FLUSH 100 UNIT/ML IV SOLN
500.0000 [IU] | Freq: Once | INTRAVENOUS | Status: AC
  Administered 2021-09-17: 500 [IU] via INTRAVENOUS

## 2021-09-17 NOTE — Patient Instructions (Signed)
[place] at [facility] ?Discharge Instructions ? ?You were seen today by Tarri Abernethy PA-C for your immunotherapy follow-up.  Your labs and EKG looked great today and do not show any signs of myocarditis.  Continue to take your thyroid pills (levothyroxine 100 mcg daily).  Dr. Delton Coombes will see you next week for your next cycle of Keytruda. ? ?**Seek immediate medical attention if you have any symptoms of myocarditis, such as new chest pain, arm pain, difficulty breathing, or swelling.   ? ? ?Thank you for choosing [place] at [facility] to provide your oncology and hematology care.  To afford each patient quality time with our provider, please arrive at least 15 minutes before your scheduled appointment time.  ? ?If you have a lab appointment with the [facility] please come in thru the Main Entrance and check in at the main information desk. ? ?You need to re-schedule your appointment should you arrive 10 or more minutes late.  We strive to give you quality time with our providers, and arriving late affects you and other patients whose appointments are after yours.  Also, if you no show three or more times for appointments you may be dismissed from the clinic at the providers discretion.     ?Again, thank you for choosing [facility].  Our hope is that these requests will decrease the amount of time that you wait before being seen by our physicians.       ?_____________________________________________________________ ? ?Should you have questions after your visit to [facility], please contact our office at [phone] and follow the prompts.  Our office hours are 8:00 a.m. and 4:30 p.m. Monday - Friday.  Please note that voicemails left after 4:00 p.m. may not be returned until the following business day.  We are closed weekends and major holidays.  You do have access to a nurse 24-7, just call the main number to the clinic  [phone] and do not press any options, hold on the line and a nurse will answer the phone.   ? ?For prescription refill requests, have your pharmacy contact our office and allow 72 hours.   ? ?Due to Covid, you will need to wear a mask upon entering the hospital. If you do not have a mask, a mask will be given to you at the Main Entrance upon arrival. For doctor visits, patients may have 1 support person age 41 or older with them. For treatment visits, patients can not have anyone with them due to social distancing guidelines and our immunocompromised population.  ? ? ? ?

## 2021-09-17 NOTE — Progress Notes (Signed)
? ?[place] ?[address] ?[place], [place] [address] ? ? ?CLINIC:  ?Medical Oncology/Hematology ? ?PCP:  ?Neale Burly, MD ?[place] / [place] [address] ?[phone] ? ? ?REASON FOR VISIT:   New start immunotherapy follow-up visit ? ?CURRENT THERAPY: Keytruda ? ?LAST TREATMENT DATE: 09/03/2021 ? ?BRIEF ONCOLOGIC HISTORY:  ?Oncology History  ?Malignant melanoma of lower leg, right ([place])  ?03/27/2021 Initial Diagnosis  ? Malignant melanoma of lower leg, right ([place]) ? ?  ?05/09/2021 - 05/09/2021 Chemotherapy  ? Patient is on Treatment Plan : MELANOMA - Opdualag (nivolumab/relatlimab) q28d  ? ?  ?  ?09/03/2021 -  Chemotherapy  ? Patient is on Treatment Plan : MELANOMA Pembrolizumab (200) q21d  ? ?  ?  ? ? ?CANCER STAGING: ?Cancer Staging  ?Malignant melanoma of lower leg, right ([place]) ?Staging form: Melanoma of the Skin, AJCC 8th Edition ?- Clinical stage from 03/27/2021: Stage IV (cT4a, cN3, cM1) - Unsigned ? ? ?INTERVAL HISTORY:  ?[name], a 81 y.o. female, is managed by Dr. Delton Coombes for stage IV malignant melanoma.  She received first cycle of treatment with Keytruda on 09/03/2021. She is seen today for toxicity check and follow-up of new start immunotherapy. ? ?She has been feeling fairly well after receiving treatment. She does not have any complaints at today's visit.  She has not had any symptoms of body pain similar to her prior episode of immunotherapy-induced myocarditis (from [place]).  She denies any chest pain, dyspnea, or cough.  She has some chronic dependent ankle edema and mild dyspnea on exertion, which are stable at baseline.  She denies any rash, nausea, vomiting, or diarrhea.  She has good energy (80%) and good appettite (100%).  Her weight is stable. ? ? ?REVIEW OF SYSTEMS:  ?Review of Systems  ?Constitutional:  Negative for appetite change, chills, diaphoresis, fatigue, fever and unexpected weight change.  ?HENT:   Negative for lump/mass and nosebleeds.    ?Eyes:  Negative for eye problems.  ?Respiratory:  Negative for cough, hemoptysis and shortness of breath.   ?Cardiovascular:  Negative for chest pain, leg swelling and palpitations.  ?Gastrointestinal:  Negative for abdominal pain, blood in stool, constipation, diarrhea, nausea and vomiting.  ?Genitourinary:  Negative for hematuria.   ?Skin: Negative.   ?Neurological:  Negative for dizziness, headaches and light-headedness.  ?Hematological:  Does not bruise/bleed easily.  ? ?PAST MEDICAL/SURGICAL HISTORY:  ?Past Medical History:  ?Diagnosis Date  ? Arthritis   ? Hypertension   ? Malignant melanoma of lower leg, right ([place]) 03/27/2021  ? Port-A-Cath in place 04/30/2021  ? ?Past Surgical History:  ?Procedure Laterality Date  ? CATARACT EXTRACTION W/PHACO Right 10/03/2014  ? Procedure: CATARACT EXTRACTION PHACO AND INTRAOCULAR LENS PLACEMENT (IOC);  Surgeon: [name], MD;  Location: [facility];  Service: Ophthalmology;  Laterality: Right;  CDE:10.09  ? CATARACT EXTRACTION W/PHACO Left 10/10/2014  ? Procedure: CATARACT EXTRACTION PHACO AND INTRAOCULAR LENS PLACEMENT (IOC);  Surgeon: [name], MD;  Location: [facility];  Service: Ophthalmology;  Laterality: Left;  CDE:8.69  ? CORNEAL 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  ? CORNEAL TRANSPLANT Left 2020  ? DENTAL RESTORATION/EXTRACTION WITH X-RAY    ? IR IMAGING GUIDED PORT INSERTION  04/05/2021  ? LEFT HEART CATH AND CORONARY ANGIOGRAPHY N/A 05/28/2021  ? Procedure: LEFT HEART CATH AND CORONARY ANGIOGRAPHY;  Surgeon: Nigel Mormon, MD;  Location: [facility];  Service: Cardiovascular;  Laterality: N/A;  ? MELANOMA EXCISION Right 02/12/2021  ?  Procedure: WIDE LOCAL EXCISION RIGHT LOWER LEG MELANOMA , ADVANCEMENT FLAP CLOSURE DEFECT;  Surgeon: [name], MD;  Location: [facility];  Service: General;  Laterality: Right;  ? SENTINEL NODE BIOPSY Right 02/12/2021  ? Procedure: SENTINEL NODE BIOPSY;  Surgeon: [name], MD;  Location: [facility];  Service: General;  Laterality:  Right;  ? ? ?SOCIAL HISTORY:  ?Social History  ? ?Socioeconomic History  ? Marital status: Widowed  ?  Spouse name: Not on file  ? Number of children: 3  ? Years of education: Not on file  ? Highest education level: Not on file  ?Occupational History  ? Not on file  ?Tobacco Use  ? Smoking status: Never  ? Smokeless tobacco: Never  ?Vaping Use  ? Vaping Use: Never used  ?Substance and Sexual Activity  ? Alcohol use: No  ? Drug use: No  ? Sexual activity: Not on file  ?Other Topics Concern  ? Not on file  ?Social History Narrative  ? Not on file  ? ?Social Determinants of Health  ? ?Financial Resource Strain: Not on file  ?Food Insecurity: Not on file  ?Transportation Needs: Not on file  ?Physical Activity: Not on file  ?Stress: Not on file  ?Social Connections: Not on file  ?Intimate Partner Violence: Not on file  ? ? ?FAMILY HISTORY: Family history has been reviewed by me and is documented elsewhere in the electronic medical record. ? ?CURRENT MEDICATIONS: Current medications have been reviewed by me and are documented elsewhere in electronic medical record. ? ?ALLERGIES: Drug allergies have been reviewed by me and are documented elsewhere in the electronic medical record. ? ?PHYSICAL EXAM:  ?Performance status (ECOG): 1 - Symptomatic but completely ambulatory ?There were no vitals filed for this visit. ?Wt Readings from Last 3 Encounters:  ?09/10/21 182 lb (82.6 kg)  ?09/03/21 183 lb 12.8 oz (83.4 kg)  ?08/05/21 185 lb (83.9 kg)  ? ?Physical Exam ?Constitutional:   ?   Appearance: Normal appearance. She is obese.  ?HENT:  ?   Head: Normocephalic and atraumatic.  ?   Mouth/Throat:  ?   Mouth: Mucous membranes are moist.  ?Eyes:  ?   Extraocular Movements: Extraocular movements intact.  ?   Pupils: Pupils are equal, round, and reactive to light.  ?Cardiovascular:  ?   Rate and Rhythm: Normal rate and regular rhythm.  ?   Pulses: Normal pulses.  ?   Heart sounds: Normal heart sounds.  ?Pulmonary:  ?   Effort:  Pulmonary effort is normal.  ?   Breath sounds: Normal breath sounds.  ?Abdominal:  ?   General: Bowel sounds are normal.  ?   Palpations: Abdomen is soft.  ?   Tenderness: There is no abdominal tenderness.  ?Musculoskeletal:     ?   General: No swelling or deformity.  ?   Right lower leg: No edema.  ?   Left lower leg: Edema present.  ?Lymphadenopathy:  ?   Cervical: No cervical adenopathy.  ?Skin: ?   General: Skin is warm and dry.  ?Neurological:  ?   General: No focal deficit present.  ?   Mental Status: She is alert and oriented to person, place, and time.  ?Psychiatric:     ?   Mood and Affect: Mood normal.     ?   Behavior: Behavior normal.  ?  ? ?LABORATORY DATA: Relevant labs have been reviewed by me and are documented elsewhere in the electronic medical record. ? ?DIAGNOSTIC  IMAGING: Relevant imaging has been reviewed by me and are documented elsewhere in the electronic medical record. ? ?ASSESSMENT & PLAN: ?1.  Stage IV malignant melanoma of lower right leg ?- Primary oncologist is Dr. 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   ?- Received first cycle of treatment with Keytruda on 09/03/2021 ?-No symptoms of myocarditis.  She denied any body pains and upper extremity pains or chest pains.  No shortness of breath at rest or exertion.   ?- No skin rashes or diarrhea.  ?- Normal troponin.  BNP 204, trending downward from previous. ?- EKG today is normal as reviewed by me Tarri Abernethy PA-C) ?- Labs (09/17/21 ): CMP at baseline, TSH trending down att 4.625 ?- PLAN:  Patient is scheduled for labs, MD visit with Dr. Delton Coombes, and next cycle of treatment on 09/23/2021  ? ?2.  Bone disease: ?- She has solitary bone metastasis posteriorly in the distal right femoral diaphysis, painless.  ?- No current complaints of pain ?- PLAN: Will consider radiation if painful  ? ?3.   Hypothyroidism: ?- Synthroid dose increased to 100 mcg daily on 08/05/2021. ?- TSH today improved to 9.4.  Down from 100.27.  Continue current dose.  ? ? ?All questions were answered. The patient knows to call the clinic with

## 2021-09-17 NOTE — Patient Instructions (Signed)
[facility]  Discharge Instructions: ?Thank you for choosing [facility] to provide your oncology and hematology care.  ?If you have a lab appointment with the Cancer Center, please come in thru the Main Entrance and check in at the main information desk. ? ?Wear comfortable clothing and clothing appropriate for easy access to any Portacath or PICC line.  ? ?We strive to give you quality time with your provider. You may need to reschedule your appointment if you arrive late (15 or more minutes).  Arriving late affects you and other patients whose appointments are after yours.  Also, if you miss three or more appointments without notifying the office, you may be dismissed from the clinic at the provider?s discretion.    ?  ?For prescription refill requests, have your pharmacy contact our office and allow 72 hours for refills to be completed.   ? ?Today you received the following chemotherapy and/or immunotherapy agents Port flush    ?  ?To help prevent nausea and vomiting after your treatment, we encourage you to take your nausea medication as directed. ? ?BELOW ARE SYMPTOMS THAT SHOULD BE REPORTED IMMEDIATELY: ?*FEVER GREATER THAN 100.4 F (38 ?C) OR HIGHER ?*CHILLS OR SWEATING ?*NAUSEA AND VOMITING THAT IS NOT CONTROLLED WITH YOUR NAUSEA MEDICATION ?*UNUSUAL SHORTNESS OF BREATH ?*UNUSUAL BRUISING OR BLEEDING ?*URINARY PROBLEMS (pain or burning when urinating, or frequent urination) ?*BOWEL PROBLEMS (unusual diarrhea, constipation, pain near the anus) ?TENDERNESS IN MOUTH AND THROAT WITH OR WITHOUT PRESENCE OF ULCERS (sore throat, sores in mouth, or a toothache) ?UNUSUAL RASH, SWELLING OR PAIN  ?UNUSUAL VAGINAL DISCHARGE OR ITCHING  ? ?Items with * indicate a potential emergency and should be followed up as soon as possible or go to the Emergency Department if any problems should occur. ? ?Please show the CHEMOTHERAPY ALERT CARD or IMMUNOTHERAPY ALERT CARD at check-in to the Emergency  Department and triage nurse. ? ?Should you have questions after your visit or need to cancel or reschedule your appointment, please contact [facility] [phone]  and follow the prompts.  Office hours are 8:00 a.m. to 4:30 p.m. Monday - Friday. Please note that voicemails left after 4:00 p.m. may not be returned until the following business day.  We are closed weekends and major holidays. You have access to a nurse at all times for urgent questions. Please call the main number to the clinic [phone] and follow the prompts. ? ?For any non-urgent questions, you may also contact your provider using MyChart. We now offer e-Visits for anyone 18 and older to request care online for non-urgent symptoms. For details visit [email or web address]. ?  ?Also download the MyChart app! Go to the app store, search "MyChart", open the app, select [facility], and log in with your MyChart username and password. ? ?Due to Covid, a mask is required upon entering the hospital/clinic. If you do not have a mask, one will be given to you upon arrival. For doctor visits, patients may have 1 support person aged 18 or older with them. For treatment visits, patients cannot have anyone with them due to current Covid guidelines and our immunocompromised population.  ?

## 2021-09-17 NOTE — Progress Notes (Signed)
Patients port flushed without difficulty.  Good blood return noted with no bruising or swelling noted at site.  Stable during access and blood draw.  Band aid applied.  VSS with discharge and left in satisfactory condition with no s/s of distress noted.   ?

## 2021-09-23 ENCOUNTER — Inpatient Hospital Stay (HOSPITAL_COMMUNITY): Payer: Medicare Other

## 2021-09-23 ENCOUNTER — Other Ambulatory Visit: Payer: Self-pay

## 2021-09-23 ENCOUNTER — Inpatient Hospital Stay (HOSPITAL_BASED_OUTPATIENT_CLINIC_OR_DEPARTMENT_OTHER): Payer: Medicare Other | Admitting: Hematology

## 2021-09-23 ENCOUNTER — Other Ambulatory Visit (HOSPITAL_COMMUNITY): Payer: Self-pay

## 2021-09-23 VITALS — BP 138/60 | HR 60 | Temp 97.1°F | Resp 18

## 2021-09-23 DIAGNOSIS — I514 Myocarditis, unspecified: Secondary | ICD-10-CM

## 2021-09-23 DIAGNOSIS — Z5112 Encounter for antineoplastic immunotherapy: Secondary | ICD-10-CM | POA: Diagnosis not present

## 2021-09-23 DIAGNOSIS — C4371 Malignant melanoma of right lower limb, including hip: Secondary | ICD-10-CM

## 2021-09-23 DIAGNOSIS — I409 Acute myocarditis, unspecified: Secondary | ICD-10-CM

## 2021-09-23 DIAGNOSIS — Z79899 Other long term (current) drug therapy: Secondary | ICD-10-CM | POA: Diagnosis not present

## 2021-09-23 DIAGNOSIS — N189 Chronic kidney disease, unspecified: Secondary | ICD-10-CM | POA: Diagnosis not present

## 2021-09-23 DIAGNOSIS — I129 Hypertensive chronic kidney disease with stage 1 through stage 4 chronic kidney disease, or unspecified chronic kidney disease: Secondary | ICD-10-CM | POA: Diagnosis not present

## 2021-09-23 DIAGNOSIS — Z95828 Presence of other vascular implants and grafts: Secondary | ICD-10-CM

## 2021-09-23 DIAGNOSIS — Z800 Family history of malignant neoplasm of digestive organs: Secondary | ICD-10-CM | POA: Diagnosis not present

## 2021-09-23 DIAGNOSIS — Z803 Family history of malignant neoplasm of breast: Secondary | ICD-10-CM | POA: Diagnosis not present

## 2021-09-23 DIAGNOSIS — C7951 Secondary malignant neoplasm of bone: Secondary | ICD-10-CM | POA: Diagnosis not present

## 2021-09-23 DIAGNOSIS — E039 Hypothyroidism, unspecified: Secondary | ICD-10-CM | POA: Diagnosis not present

## 2021-09-23 DIAGNOSIS — I214 Non-ST elevation (NSTEMI) myocardial infarction: Secondary | ICD-10-CM

## 2021-09-23 LAB — CBC WITH DIFFERENTIAL/PLATELET
Abs Immature Granulocytes: 0.01 10*3/uL (ref 0.00–0.07)
Basophils Absolute: 0 10*3/uL (ref 0.0–0.1)
Basophils Relative: 1 %
Eosinophils Absolute: 0.1 10*3/uL (ref 0.0–0.5)
Eosinophils Relative: 3 %
HCT: 38.2 % (ref 36.0–46.0)
Hemoglobin: 12.6 g/dL (ref 12.0–15.0)
Immature Granulocytes: 0 %
Lymphocytes Relative: 27 %
Lymphs Abs: 1.3 10*3/uL (ref 0.7–4.0)
MCH: 31.7 pg (ref 26.0–34.0)
MCHC: 33 g/dL (ref 30.0–36.0)
MCV: 96 fL (ref 80.0–100.0)
Monocytes Absolute: 0.4 10*3/uL (ref 0.1–1.0)
Monocytes Relative: 8 %
Neutro Abs: 2.9 10*3/uL (ref 1.7–7.7)
Neutrophils Relative %: 61 %
Platelets: 155 10*3/uL (ref 150–400)
RBC: 3.98 MIL/uL (ref 3.87–5.11)
RDW: 12 % (ref 11.5–15.5)
WBC: 4.8 10*3/uL (ref 4.0–10.5)
nRBC: 0 % (ref 0.0–0.2)

## 2021-09-23 LAB — COMPREHENSIVE METABOLIC PANEL
ALT: 13 U/L (ref 0–44)
AST: 21 U/L (ref 15–41)
Albumin: 3.9 g/dL (ref 3.5–5.0)
Alkaline Phosphatase: 79 U/L (ref 38–126)
Anion gap: 4 — ABNORMAL LOW (ref 5–15)
BUN: 10 mg/dL (ref 8–23)
CO2: 29 mmol/L (ref 22–32)
Calcium: 8.8 mg/dL — ABNORMAL LOW (ref 8.9–10.3)
Chloride: 103 mmol/L (ref 98–111)
Creatinine, Ser: 0.71 mg/dL (ref 0.44–1.00)
GFR, Estimated: >60 mL/min (ref >60)
Glucose, Bld: 124 mg/dL — ABNORMAL HIGH (ref 70–99)
Potassium: 4.1 mmol/L (ref 3.5–5.1)
Sodium: 136 mmol/L (ref 135–145)
Total Bilirubin: 0.6 mg/dL (ref 0.3–1.2)
Total Protein: 6.4 g/dL — ABNORMAL LOW (ref 6.5–8.1)

## 2021-09-23 LAB — TSH: TSH: 4.035 u[IU]/mL (ref 0.350–4.500)

## 2021-09-23 LAB — MAGNESIUM: Magnesium: 2 mg/dL (ref 1.7–2.4)

## 2021-09-23 LAB — TROPONIN I (HIGH SENSITIVITY): Troponin I (High Sensitivity): 13 ng/L (ref <18)

## 2021-09-23 LAB — BRAIN NATRIURETIC PEPTIDE: B Natriuretic Peptide: 265 pg/mL — ABNORMAL HIGH (ref 0.0–100.0)

## 2021-09-23 LAB — LACTATE DEHYDROGENASE: LDH: 162 U/L (ref 98–192)

## 2021-09-23 MED ORDER — SODIUM CHLORIDE 0.9% FLUSH
10.0000 mL | INTRAVENOUS | Status: DC | PRN
  Administered 2021-09-23: 10 mL

## 2021-09-23 MED ORDER — SODIUM CHLORIDE 0.9 % IV SOLN: Freq: Once | INTRAVENOUS | Status: AC

## 2021-09-23 MED ORDER — HEPARIN SOD (PORK) LOCK FLUSH 100 UNIT/ML IV SOLN
500.0000 [IU] | Freq: Once | INTRAVENOUS | Status: AC | PRN
  Administered 2021-09-23: 500 [IU]

## 2021-09-23 MED ORDER — SODIUM CHLORIDE 0.9 % IV SOLN
200.0000 mg | Freq: Once | INTRAVENOUS | Status: AC
  Administered 2021-09-23: 200 mg via INTRAVENOUS
  Filled 2021-09-23: qty 8

## 2021-09-23 NOTE — Progress Notes (Signed)
Patient tolerated therapy with no complaints voiced.  Side effects with management reviewed with understanding verbalized.  Port site clean and dry with no bruising or swelling noted at site.  Good blood return noted before and after administration of therapy.  Band aid applied.  Patient left in satisfactory condition with VSS and no s/s of distress noted.  

## 2021-09-23 NOTE — Patient Instructions (Signed)
[facility]  Discharge Instructions: ?Thank you for choosing [facility] to provide your oncology and hematology care.  ?If you have a lab appointment with the Cancer Center, please come in thru the Main Entrance and check in at the main information desk. ? ?Wear comfortable clothing and clothing appropriate for easy access to any Portacath or PICC line.  ? ?We strive to give you quality time with your provider. You may need to reschedule your appointment if you arrive late (15 or more minutes).  Arriving late affects you and other patients whose appointments are after yours.  Also, if you miss three or more appointments without notifying the office, you may be dismissed from the clinic at the provider?s discretion.    ?  ?For prescription refill requests, have your pharmacy contact our office and allow 72 hours for refills to be completed.   ? ?Today you received the following chemotherapy and/or immunotherapy agents Keytruda, return as scheduled.  ?  ?To help prevent nausea and vomiting after your treatment, we encourage you to take your nausea medication as directed. ? ?BELOW ARE SYMPTOMS THAT SHOULD BE REPORTED IMMEDIATELY: ?*FEVER GREATER THAN 100.4 F (38 ?C) OR HIGHER ?*CHILLS OR SWEATING ?*NAUSEA AND VOMITING THAT IS NOT CONTROLLED WITH YOUR NAUSEA MEDICATION ?*UNUSUAL SHORTNESS OF BREATH ?*UNUSUAL BRUISING OR BLEEDING ?*URINARY PROBLEMS (pain or burning when urinating, or frequent urination) ?*BOWEL PROBLEMS (unusual diarrhea, constipation, pain near the anus) ?TENDERNESS IN MOUTH AND THROAT WITH OR WITHOUT PRESENCE OF ULCERS (sore throat, sores in mouth, or a toothache) ?UNUSUAL RASH, SWELLING OR PAIN  ?UNUSUAL VAGINAL DISCHARGE OR ITCHING  ? ?Items with * indicate a potential emergency and should be followed up as soon as possible or go to the Emergency Department if any problems should occur. ? ?Please show the CHEMOTHERAPY ALERT CARD or IMMUNOTHERAPY ALERT CARD at check-in to  the Emergency Department and triage nurse. ? ?Should you have questions after your visit or need to cancel or reschedule your appointment, please contact [facility] [phone]  and follow the prompts.  Office hours are 8:00 a.m. to 4:30 p.m. Monday - Friday. Please note that voicemails left after 4:00 p.m. may not be returned until the following business day.  We are closed weekends and major holidays. You have access to a nurse at all times for urgent questions. Please call the main number to the clinic [phone] and follow the prompts. ? ?For any non-urgent questions, you may also contact your provider using MyChart. We now offer e-Visits for anyone 18 and older to request care online for non-urgent symptoms. For details visit [email or web address]. ?  ?Also download the MyChart app! Go to the app store, search "MyChart", open the app, select [facility], and log in with your MyChart username and password. ? ?Due to Covid, a mask is required upon entering the hospital/clinic. If you do not have a mask, one will be given to you upon arrival. For doctor visits, patients may have 1 support person aged 18 or older with them. For treatment visits, patients cannot have anyone with them due to current Covid guidelines and our immunocompromised population.  ?

## 2021-09-23 NOTE — Progress Notes (Signed)
? ?[facility] ?[address] ?[place], [place] [address] ? ? ?CLINIC:  ?Medical Oncology/Hematology ? ?PCP:  ?Neale Burly, MD ?[place] / [place] [address] ?[phone] ? ? ?REASON FOR VISIT:  ?Follow-up for metastatic malignant melanoma, BRAF V600 negative ? ?PRIOR THERAPY: none ? ?NGS Results: BRAF negative, positive for NRAS pathogenic variant exon 3, TERT promoter.  MSI-stable.  MMR-proficient.  [place] 1/2/3 fusion not detected.  KIT mutation negative.  PD-L1 negative. ? ?CURRENT THERAPY: Pembrolizumab (200) q21d Keytruda ? ?BRIEF ONCOLOGIC HISTORY:  ?Oncology History  ?Malignant melanoma of lower leg, right ([place])  ?03/27/2021 Initial Diagnosis  ? Malignant melanoma of lower leg, right ([place]) ? ?  ?05/09/2021 - 05/09/2021 Chemotherapy  ? Patient is on Treatment Plan : MELANOMA - Opdualag (nivolumab/relatlimab) q28d  ? ?   ?09/03/2021 -  Chemotherapy  ? Patient is on Treatment Plan : MELANOMA Pembrolizumab (200) q21d  ? ?   ? ? ?CANCER STAGING: ? Cancer Staging  ?Malignant melanoma of lower leg, right ([place]) ?Staging form: Melanoma of the Skin, AJCC 8th Edition ?- Clinical stage from 03/27/2021: Stage IV (cT4a, cN3, cM1) - Unsigned ? ? ?INTERVAL HISTORY:  ?[name], a 81 y.o. female, returns for routine follow-up and consideration for next cycle of chemotherapy. Curtistine was last seen on 09/10/2021. ? ?Due for cycle #2 of Keytruda today.  ? ?Overall, she tells me she has been feeling pretty well. She denies CP, body pains, and diarrhea. She reports constipation which was resolved with Metamucil. She denies skin rash. She reports occasional fatigue with exertion which has not worsened with treatment start. Her appetite is good. She denies new pains. She continues to take Synthroid. She reports dry mouth starting 2 weeks ago, and she has to drink liquids along with dry foods such as cookies or breads in order to eat them. She denies palpitations and ankle swellings.  ? ?Overall, she feels  ready for next cycle of chemo today.  ? ?REVIEW OF SYSTEMS:  ?Review of Systems  ?Constitutional:  Positive for fatigue. Negative for appetite change.  ?HENT:     ?     Dry mouth  ?Cardiovascular:  Negative for chest pain, leg swelling and palpitations.  ?Gastrointestinal:  Negative for constipation (resolved) and diarrhea.  ?Genitourinary:  Positive for frequency.   ?Musculoskeletal:  Negative for arthralgias.  ?Skin:  Negative for rash.  ?All other systems reviewed and are negative. ? ?PAST MEDICAL/SURGICAL HISTORY:  ?Past Medical History:  ?Diagnosis Date  ? Arthritis   ? Hypertension   ? Malignant melanoma of lower leg, right ([place]) 03/27/2021  ? Port-A-Cath in place 04/30/2021  ? ?Past Surgical History:  ?Procedure Laterality Date  ? CATARACT EXTRACTION W/PHACO Right 10/03/2014  ? Procedure: CATARACT EXTRACTION PHACO AND INTRAOCULAR LENS PLACEMENT (IOC);  Surgeon: [name], MD;  Location: [facility];  Service: Ophthalmology;  Laterality: Right;  CDE:10.09  ? CATARACT EXTRACTION W/PHACO Left 10/10/2014  ? Procedure: CATARACT EXTRACTION PHACO AND INTRAOCULAR LENS PLACEMENT (IOC);  Surgeon: [name], MD;  Location: [facility];  Service: Ophthalmology;  Laterality: Left;  CDE:8.69  ? CORNEAL 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  ? CORNEAL TRANSPLANT Left 2020  ? DENTAL RESTORATION/EXTRACTION WITH X-RAY    ? IR IMAGING GUIDED PORT INSERTION  04/05/2021  ? LEFT HEART CATH AND CORONARY ANGIOGRAPHY N/A 05/28/2021  ? Procedure: LEFT HEART CATH AND CORONARY ANGIOGRAPHY;  Surgeon: Nigel Mormon, MD;  Location: [facility];  Service: Cardiovascular;  Laterality: N/A;  ? MELANOMA EXCISION Right 02/12/2021  ? Procedure: WIDE LOCAL EXCISION RIGHT LOWER LEG MELANOMA , ADVANCEMENT FLAP CLOSURE DEFECT;  Surgeon: [name], MD;  Location: [facility];  Service: General;  Laterality: Right;  ? SENTINEL NODE BIOPSY Right 02/12/2021  ? Procedure: SENTINEL NODE BIOPSY;  Surgeon: [name], MD;  Location: [facility];  Service: General;   Laterality: Right;  ? ? ?SOCIAL HISTORY:  ?Social History  ? ?Socioeconomic History  ? Marital status: Widowed  ?  Spouse name: Not on file  ? Number of children: 3  ? Years of education: Not on file  ? Highest education level: Not on file  ?Occupational History  ? Not on file  ?Tobacco Use  ? Smoking status: Never  ? Smokeless tobacco: Never  ?Vaping Use  ? Vaping Use: Never used  ?Substance and Sexual Activity  ? Alcohol use: No  ? Drug use: No  ? Sexual activity: Not on file  ?Other Topics Concern  ? Not on file  ?Social History Narrative  ? Not on file  ? ?Social Determinants of Health  ? ?Financial Resource Strain: Not on file  ?Food Insecurity: Not on file  ?Transportation Needs: Not on file  ?Physical Activity: Not on file  ?Stress: Not on file  ?Social Connections: Not on file  ?Intimate Partner Violence: Not on file  ? ? ?FAMILY HISTORY:  ?Family History  ?Problem Relation Age of Onset  ? Cancer Father   ? Hypertension Sister   ? Hypertension Sister   ? ? ?CURRENT MEDICATIONS:  ?Current Outpatient Medications  ?Medication Sig Dispense Refill  ? alendronate (FOSAMAX) 70 MG tablet Take 70 mg by mouth once a week.  0  ? ALPRAZolam (XANAX) 0.5 MG tablet Take 0.5 mg by mouth 3 (three) times daily.  2  ? aspirin EC 81 MG EC tablet Take 1 tablet (81 mg total) by mouth daily. Swallow whole. 30 tablet 11  ? calcium carbonate (OS-CAL - DOSED IN MG OF ELEMENTAL CALCIUM) 1250 (500 CA) MG tablet Take 1 tablet by mouth 3 (three) times a week.    ? cholecalciferol (VITAMIN D) 1000 UNITS tablet Take 1,000 Units by mouth 3 (three) times a week.    ? Cyanocobalamin (VITAMIN B-12 PO) Take 1 tablet by mouth 3 (three) times a week.    ? levothyroxine (SYNTHROID) 100 MCG tablet Take 1 tablet (100 mcg total) by mouth daily before breakfast. 30 tablet 3  ? prednisoLONE acetate (PRED FORTE) 1 % ophthalmic suspension Place 1 drop into both eyes See admin instructions. Instill one drop into the right eye twice daily and one drop into  the left eye once daily.    ? atorvastatin (LIPITOR) 40 MG tablet Take 1 tablet (40 mg total) by mouth daily. 30 tablet 0  ? metoprolol tartrate (LOPRESSOR) 25 MG tablet Take 1 tablet (25 mg total) by mouth 2 (two) times daily. 60 tablet 0  ? ?No current facility-administered medications for this visit.  ? ? ?ALLERGIES:  ?Allergies  ?Allergen Reactions  ? Tape   ?  Pulls skin, please use paper tape  ? ? ?PHYSICAL EXAM:  ?Performance status (ECOG): 0 - Asymptomatic ? ?There were no vitals filed for this visit. ?Wt Readings from Last 3 Encounters:  ?09/23/21 180 lb 3.2 oz (81.7 kg)  ?09/17/21 180 lb 6.4 oz (81.8 kg)  ?09/10/21 182 lb (82.6 kg)  ? ?Physical Exam ?Vitals reviewed.  ?Constitutional:   ?   Appearance: Normal appearance.  She is obese.  ?Cardiovascular:  ?   Rate and Rhythm: Normal rate and regular rhythm.  ?   Pulses: Normal pulses.  ?   Heart sounds: Normal heart sounds.  ?Pulmonary:  ?   Effort: Pulmonary effort is normal.  ?   Breath sounds: Normal breath sounds.  ?Musculoskeletal:  ?   Right lower leg: No edema.  ?   Left lower leg: No edema.  ?Neurological:  ?   General: No focal deficit present.  ?   Mental Status: She is alert and oriented to person, place, and time.  ?Psychiatric:     ?   Mood and Affect: Mood normal.     ?   Behavior: Behavior normal.  ? ? ?LABORATORY DATA:  ?I have reviewed the labs as listed.  ? ?  Latest Ref Rng & Units 09/23/2021  ?  9:55 AM 09/10/2021  ? 10:39 AM 09/03/2021  ?  8:50 AM  ?CBC  ?WBC 4.0 - 10.5 K/uL 4.8   5.2   5.1    ?Hemoglobin 12.0 - 15.0 g/dL 12.6   12.6   12.3    ?Hematocrit 36.0 - 46.0 % 38.2   38.2   37.7    ?Platelets 150 - 400 K/uL 155   169   143    ? ? ?  Latest Ref Rng & Units 09/23/2021  ?  9:55 AM 09/17/2021  ?  9:37 AM 09/10/2021  ? 10:39 AM  ?CMP  ?Glucose 70 - 99 mg/dL 124   125   127    ?BUN 8 - 23 mg/dL _0 ?Creatinine 0.44 - 1.00 mg/dL 0.71   0.89   0.80    ?Sodium 135 - 145 mmol/L 136   138   136    ?Potassium 3.5 - 5.1 mmol/L 4.1   3.9    4.0    ?Chloride 98 - 111 mmol/L 103   103   101    ?CO2 22 - 32 mmol/L _1 ?Calcium 8.9 - 10.3 mg/dL 8.8   8.9   9.2    ?Total Protein 6.5 - 8.1 g/dL 6.4   6.6   6.6    ?Total Bilirubin

## 2021-09-30 ENCOUNTER — Inpatient Hospital Stay (HOSPITAL_COMMUNITY): Payer: Medicare Other

## 2021-09-30 DIAGNOSIS — Z0000 Encounter for general adult medical examination without abnormal findings: Secondary | ICD-10-CM | POA: Diagnosis not present

## 2021-09-30 DIAGNOSIS — I10 Essential (primary) hypertension: Secondary | ICD-10-CM | POA: Diagnosis not present

## 2021-09-30 DIAGNOSIS — E7849 Other hyperlipidemia: Secondary | ICD-10-CM | POA: Diagnosis not present

## 2021-09-30 DIAGNOSIS — M810 Age-related osteoporosis without current pathological fracture: Secondary | ICD-10-CM | POA: Diagnosis not present

## 2021-10-01 ENCOUNTER — Inpatient Hospital Stay (HOSPITAL_BASED_OUTPATIENT_CLINIC_OR_DEPARTMENT_OTHER): Payer: Medicare Other

## 2021-10-01 ENCOUNTER — Other Ambulatory Visit: Payer: Self-pay

## 2021-10-01 ENCOUNTER — Encounter (HOSPITAL_COMMUNITY): Payer: Self-pay

## 2021-10-01 ENCOUNTER — Other Ambulatory Visit (HOSPITAL_COMMUNITY): Payer: Self-pay

## 2021-10-01 VITALS — BP 136/65 | HR 69 | Temp 97.3°F | Resp 20

## 2021-10-01 DIAGNOSIS — Z79899 Other long term (current) drug therapy: Secondary | ICD-10-CM | POA: Diagnosis not present

## 2021-10-01 DIAGNOSIS — E039 Hypothyroidism, unspecified: Secondary | ICD-10-CM | POA: Diagnosis not present

## 2021-10-01 DIAGNOSIS — C4371 Malignant melanoma of right lower limb, including hip: Secondary | ICD-10-CM

## 2021-10-01 DIAGNOSIS — I514 Myocarditis, unspecified: Secondary | ICD-10-CM

## 2021-10-01 DIAGNOSIS — C7951 Secondary malignant neoplasm of bone: Secondary | ICD-10-CM | POA: Diagnosis not present

## 2021-10-01 DIAGNOSIS — Z95828 Presence of other vascular implants and grafts: Secondary | ICD-10-CM

## 2021-10-01 DIAGNOSIS — Z5112 Encounter for antineoplastic immunotherapy: Secondary | ICD-10-CM | POA: Diagnosis not present

## 2021-10-01 DIAGNOSIS — I129 Hypertensive chronic kidney disease with stage 1 through stage 4 chronic kidney disease, or unspecified chronic kidney disease: Secondary | ICD-10-CM | POA: Diagnosis not present

## 2021-10-01 DIAGNOSIS — N189 Chronic kidney disease, unspecified: Secondary | ICD-10-CM | POA: Diagnosis not present

## 2021-10-01 DIAGNOSIS — Z803 Family history of malignant neoplasm of breast: Secondary | ICD-10-CM | POA: Diagnosis not present

## 2021-10-01 DIAGNOSIS — Z800 Family history of malignant neoplasm of digestive organs: Secondary | ICD-10-CM | POA: Diagnosis not present

## 2021-10-01 LAB — BASIC METABOLIC PANEL
Anion gap: 4 — ABNORMAL LOW (ref 5–15)
BUN: 9 mg/dL (ref 8–23)
CO2: 30 mmol/L (ref 22–32)
Calcium: 8.7 mg/dL — ABNORMAL LOW (ref 8.9–10.3)
Chloride: 104 mmol/L (ref 98–111)
Creatinine, Ser: 0.77 mg/dL (ref 0.44–1.00)
GFR, Estimated: >60 mL/min (ref >60)
Glucose, Bld: 118 mg/dL — ABNORMAL HIGH (ref 70–99)
Potassium: 3.9 mmol/L (ref 3.5–5.1)
Sodium: 138 mmol/L (ref 135–145)

## 2021-10-01 LAB — TROPONIN I (HIGH SENSITIVITY): Troponin I (High Sensitivity): 13 ng/L (ref <18)

## 2021-10-01 MED ORDER — SODIUM CHLORIDE 0.9% FLUSH
10.0000 mL | Freq: Once | INTRAVENOUS | Status: AC
  Administered 2021-10-01: 10 mL via INTRAVENOUS

## 2021-10-01 MED ORDER — HEPARIN SOD (PORK) LOCK FLUSH 100 UNIT/ML IV SOLN
500.0000 [IU] | Freq: Once | INTRAVENOUS | Status: AC
  Administered 2021-10-01: 500 [IU] via INTRAVENOUS

## 2021-10-01 NOTE — Patient Instructions (Signed)
[facility]  Discharge Instructions: Thank you for choosing [facility] to provide your oncology and hematology care.  If you have a lab appointment with the [facility], please come in thru the Main Entrance and check in at the main information desk.  Wear comfortable clothing and clothing appropriate for easy access to any Portacath or PICC line.   We strive to give you quality time with your provider. You may need to reschedule your appointment if you arrive late (15 or more minutes).  Arriving late affects you and other patients whose appointments are after yours.  Also, if you miss three or more appointments without notifying the office, you may be dismissed from the clinic at the provider's discretion.      For prescription refill requests, have your pharmacy contact our office and allow 72 hours for refills to be completed.    Today your labs were drawn and EKG was performed, return as scheduled.   To help prevent nausea and vomiting after your treatment, we encourage you to take your nausea medication as directed.  BELOW ARE SYMPTOMS THAT SHOULD BE REPORTED IMMEDIATELY: *FEVER GREATER THAN 100.4 F (38 C) OR HIGHER *CHILLS OR SWEATING *NAUSEA AND VOMITING THAT IS NOT CONTROLLED WITH YOUR NAUSEA MEDICATION *UNUSUAL SHORTNESS OF BREATH *UNUSUAL BRUISING OR BLEEDING *URINARY PROBLEMS (pain or burning when urinating, or frequent urination) *BOWEL PROBLEMS (unusual diarrhea, constipation, pain near the anus) TENDERNESS IN MOUTH AND THROAT WITH OR WITHOUT PRESENCE OF ULCERS (sore throat, sores in mouth, or a toothache) UNUSUAL RASH, SWELLING OR PAIN  UNUSUAL VAGINAL DISCHARGE OR ITCHING   Items with * indicate a potential emergency and should be followed up as soon as possible or go to the Emergency Department if any problems should occur.  Please show the CHEMOTHERAPY ALERT CARD or IMMUNOTHERAPY ALERT CARD at check-in to the Emergency Department and triage  nurse.  Should you have questions after your visit or need to cancel or reschedule your appointment, please contact [facility] [phone]  and follow the prompts.  Office hours are 8:00 a.m. to 4:30 p.m. Monday - Friday. Please note that voicemails left after 4:00 p.m. may not be returned until the following business day.  We are closed weekends and major holidays. You have access to a nurse at all times for urgent questions. Please call the main number to the clinic [phone] and follow the prompts.  For any non-urgent questions, you may also contact your provider using MyChart. We now offer e-Visits for anyone 84 and older to request care online for non-urgent symptoms. For details visit [email or web address].   Also download the MyChart app! Go to the app store, search "MyChart", open the app, select [facility], and log in with your MyChart username and password.  Due to Covid, a mask is required upon entering the hospital/clinic. If you do not have a mask, one will be given to you upon arrival. For doctor visits, patients may have 1 support person aged 48 or older with them. For treatment visits, patients cannot have anyone with them due to current Covid guidelines and our immunocompromised population.

## 2021-10-01 NOTE — Progress Notes (Signed)
Port flushed with good blood return noted. No bruising or swelling at site. Labs drawn. Bandaid applied, EKG performed, and patient discharged in satisfactory condition. VVS stable with no signs or symptoms of distressed noted.

## 2021-10-10 ENCOUNTER — Other Ambulatory Visit (HOSPITAL_COMMUNITY): Payer: Self-pay

## 2021-10-10 DIAGNOSIS — I514 Myocarditis, unspecified: Secondary | ICD-10-CM

## 2021-10-10 DIAGNOSIS — I409 Acute myocarditis, unspecified: Secondary | ICD-10-CM

## 2021-10-10 DIAGNOSIS — Z298 Encounter for other specified prophylactic measures: Secondary | ICD-10-CM

## 2021-10-10 DIAGNOSIS — C4371 Malignant melanoma of right lower limb, including hip: Secondary | ICD-10-CM

## 2021-10-14 ENCOUNTER — Inpatient Hospital Stay (HOSPITAL_COMMUNITY): Payer: Medicare Other

## 2021-10-14 ENCOUNTER — Inpatient Hospital Stay (HOSPITAL_COMMUNITY): Payer: Medicare Other | Admitting: Hematology

## 2021-10-15 ENCOUNTER — Inpatient Hospital Stay (HOSPITAL_COMMUNITY): Payer: Medicare Other

## 2021-10-15 ENCOUNTER — Inpatient Hospital Stay (HOSPITAL_BASED_OUTPATIENT_CLINIC_OR_DEPARTMENT_OTHER): Payer: Medicare Other | Admitting: Hematology

## 2021-10-15 ENCOUNTER — Other Ambulatory Visit (HOSPITAL_COMMUNITY): Payer: Self-pay

## 2021-10-15 ENCOUNTER — Inpatient Hospital Stay (HOSPITAL_COMMUNITY): Payer: Medicare Other | Attending: Hematology

## 2021-10-15 ENCOUNTER — Other Ambulatory Visit: Payer: Self-pay

## 2021-10-15 VITALS — BP 129/73 | HR 57 | Temp 98.6°F | Resp 16

## 2021-10-15 VITALS — BP 111/67 | HR 67 | Temp 98.7°F | Resp 17 | Ht 62.0 in | Wt 177.5 lb

## 2021-10-15 DIAGNOSIS — Z79899 Other long term (current) drug therapy: Secondary | ICD-10-CM | POA: Insufficient documentation

## 2021-10-15 DIAGNOSIS — Z8679 Personal history of other diseases of the circulatory system: Secondary | ICD-10-CM | POA: Diagnosis not present

## 2021-10-15 DIAGNOSIS — I514 Myocarditis, unspecified: Secondary | ICD-10-CM | POA: Diagnosis not present

## 2021-10-15 DIAGNOSIS — C4371 Malignant melanoma of right lower limb, including hip: Secondary | ICD-10-CM

## 2021-10-15 DIAGNOSIS — E039 Hypothyroidism, unspecified: Secondary | ICD-10-CM | POA: Diagnosis not present

## 2021-10-15 DIAGNOSIS — I409 Acute myocarditis, unspecified: Secondary | ICD-10-CM

## 2021-10-15 DIAGNOSIS — R778 Other specified abnormalities of plasma proteins: Secondary | ICD-10-CM | POA: Insufficient documentation

## 2021-10-15 DIAGNOSIS — C7951 Secondary malignant neoplasm of bone: Secondary | ICD-10-CM | POA: Diagnosis not present

## 2021-10-15 DIAGNOSIS — Z95828 Presence of other vascular implants and grafts: Secondary | ICD-10-CM

## 2021-10-15 DIAGNOSIS — Z5112 Encounter for antineoplastic immunotherapy: Secondary | ICD-10-CM | POA: Insufficient documentation

## 2021-10-15 DIAGNOSIS — I214 Non-ST elevation (NSTEMI) myocardial infarction: Secondary | ICD-10-CM

## 2021-10-15 LAB — COMPREHENSIVE METABOLIC PANEL
ALT: 16 U/L (ref 0–44)
AST: 26 U/L (ref 15–41)
Albumin: 4 g/dL (ref 3.5–5.0)
Alkaline Phosphatase: 73 U/L (ref 38–126)
Anion gap: 9 (ref 5–15)
BUN: 10 mg/dL (ref 8–23)
CO2: 27 mmol/L (ref 22–32)
Calcium: 9.3 mg/dL (ref 8.9–10.3)
Chloride: 103 mmol/L (ref 98–111)
Creatinine, Ser: 0.73 mg/dL (ref 0.44–1.00)
GFR, Estimated: >60 mL/min (ref >60)
Glucose, Bld: 110 mg/dL — ABNORMAL HIGH (ref 70–99)
Potassium: 4.2 mmol/L (ref 3.5–5.1)
Sodium: 139 mmol/L (ref 135–145)
Total Bilirubin: 0.7 mg/dL (ref 0.3–1.2)
Total Protein: 6.5 g/dL (ref 6.5–8.1)

## 2021-10-15 LAB — CBC WITH DIFFERENTIAL/PLATELET
Abs Immature Granulocytes: 0.01 10*3/uL (ref 0.00–0.07)
Basophils Absolute: 0 10*3/uL (ref 0.0–0.1)
Basophils Relative: 1 %
Eosinophils Absolute: 0.1 10*3/uL (ref 0.0–0.5)
Eosinophils Relative: 2 %
HCT: 40.7 % (ref 36.0–46.0)
Hemoglobin: 13.4 g/dL (ref 12.0–15.0)
Immature Granulocytes: 0 %
Lymphocytes Relative: 28 %
Lymphs Abs: 1.3 10*3/uL (ref 0.7–4.0)
MCH: 30.7 pg (ref 26.0–34.0)
MCHC: 32.9 g/dL (ref 30.0–36.0)
MCV: 93.3 fL (ref 80.0–100.0)
Monocytes Absolute: 0.4 10*3/uL (ref 0.1–1.0)
Monocytes Relative: 9 %
Neutro Abs: 2.8 10*3/uL (ref 1.7–7.7)
Neutrophils Relative %: 60 %
Platelets: 160 10*3/uL (ref 150–400)
RBC: 4.36 MIL/uL (ref 3.87–5.11)
RDW: 11.6 % (ref 11.5–15.5)
WBC: 4.6 10*3/uL (ref 4.0–10.5)
nRBC: 0 % (ref 0.0–0.2)

## 2021-10-15 LAB — TSH: TSH: 8.057 u[IU]/mL — ABNORMAL HIGH (ref 0.350–4.500)

## 2021-10-15 LAB — MAGNESIUM: Magnesium: 2 mg/dL (ref 1.7–2.4)

## 2021-10-15 LAB — LACTATE DEHYDROGENASE: LDH: 183 U/L (ref 98–192)

## 2021-10-15 LAB — TROPONIN I (HIGH SENSITIVITY): Troponin I (High Sensitivity): 22 ng/L — ABNORMAL HIGH (ref <18)

## 2021-10-15 LAB — BRAIN NATRIURETIC PEPTIDE: B Natriuretic Peptide: 221 pg/mL — ABNORMAL HIGH (ref 0.0–100.0)

## 2021-10-15 MED ORDER — SODIUM CHLORIDE 0.9% FLUSH
10.0000 mL | INTRAVENOUS | Status: DC | PRN
  Administered 2021-10-15: 10 mL

## 2021-10-15 MED ORDER — SODIUM CHLORIDE 0.9 % IV SOLN
200.0000 mg | Freq: Once | INTRAVENOUS | Status: AC
  Administered 2021-10-15: 200 mg via INTRAVENOUS
  Filled 2021-10-15: qty 8

## 2021-10-15 MED ORDER — SODIUM CHLORIDE 0.9 % IV SOLN: Freq: Once | INTRAVENOUS | Status: AC

## 2021-10-15 MED ORDER — HEPARIN SOD (PORK) LOCK FLUSH 100 UNIT/ML IV SOLN
500.0000 [IU] | Freq: Once | INTRAVENOUS | Status: AC | PRN
  Administered 2021-10-15: 500 [IU]

## 2021-10-15 NOTE — Patient Instructions (Addendum)
[facility] at [facility] Discharge Instructions   You were seen and examined today by Dr. Katragadda.  He reviewed the results of your lab work which are normal/stable.   We will proceed with your treatment today.  Return as scheduled.    Thank you for choosing [facility] at [facility] to provide your oncology and hematology care.  To afford each patient quality time with our provider, please arrive at least 15 minutes before your scheduled appointment time.   If you have a lab appointment with the Cancer Center please come in thru the Main Entrance and check in at the main information desk.  You need to re-schedule your appointment should you arrive 10 or more minutes late.  We strive to give you quality time with our providers, and arriving late affects you and other patients whose appointments are after yours.  Also, if you no show three or more times for appointments you may be dismissed from the clinic at the providers discretion.     Again, thank you for choosing [facility].  Our hope is that these requests will decrease the amount of time that you wait before being seen by our physicians.       _____________________________________________________________  Should you have questions after your visit to [facility], please contact our office at [phone] and follow the prompts.  Our office hours are 8:00 a.m. and 4:30 p.m. Monday - Friday.  Please note that voicemails left after 4:00 p.m. may not be returned until the following business day.  We are closed weekends and major holidays.  You do have access to a nurse 24-7, just call the main number to the clinic [phone] and do not press any options, hold on the line and a nurse will answer the phone.    For prescription refill requests, have your pharmacy contact our office and allow 72 hours.    Due to Covid, you will need to wear a mask upon entering  the hospital. If you do not have a mask, a mask will be given to you at the Main Entrance upon arrival. For doctor visits, patients may have 1 support person age 18 or older with them. For treatment visits, patients can not have anyone with them due to social distancing guidelines and our immunocompromised population.      

## 2021-10-15 NOTE — Patient Instructions (Signed)
[facility]  Discharge Instructions: Thank you for choosing [facility] to provide your oncology and hematology care.  If you have a lab appointment with the Cancer Center, please come in thru the Main Entrance and check in at the main information desk.  Wear comfortable clothing and clothing appropriate for easy access to any Portacath or PICC line.   We strive to give you quality time with your provider. You may need to reschedule your appointment if you arrive late (15 or more minutes).  Arriving late affects you and other patients whose appointments are after yours.  Also, if you miss three or more appointments without notifying the office, you may be dismissed from the clinic at the provider's discretion.      For prescription refill requests, have your pharmacy contact our office and allow 72 hours for refills to be completed.        To help prevent nausea and vomiting after your treatment, we encourage you to take your nausea medication as directed.  BELOW ARE SYMPTOMS THAT SHOULD BE REPORTED IMMEDIATELY: *FEVER GREATER THAN 100.4 F (38 C) OR HIGHER *CHILLS OR SWEATING *NAUSEA AND VOMITING THAT IS NOT CONTROLLED WITH YOUR NAUSEA MEDICATION *UNUSUAL SHORTNESS OF BREATH *UNUSUAL BRUISING OR BLEEDING *URINARY PROBLEMS (pain or burning when urinating, or frequent urination) *BOWEL PROBLEMS (unusual diarrhea, constipation, pain near the anus) TENDERNESS IN MOUTH AND THROAT WITH OR WITHOUT PRESENCE OF ULCERS (sore throat, sores in mouth, or a toothache) UNUSUAL RASH, SWELLING OR PAIN  UNUSUAL VAGINAL DISCHARGE OR ITCHING   Items with * indicate a potential emergency and should be followed up as soon as possible or go to the Emergency Department if any problems should occur.  Please show the CHEMOTHERAPY ALERT CARD or IMMUNOTHERAPY ALERT CARD at check-in to the Emergency Department and triage nurse.  Should you have questions after your visit or need to cancel  or reschedule your appointment, please contact [facility] [phone]  and follow the prompts.  Office hours are 8:00 a.m. to 4:30 p.m. Monday - Friday. Please note that voicemails left after 4:00 p.m. may not be returned until the following business day.  We are closed weekends and major holidays. You have access to a nurse at all times for urgent questions. Please call the main number to the clinic [phone] and follow the prompts.  For any non-urgent questions, you may also contact your provider using MyChart. We now offer e-Visits for anyone 18 and older to request care online for non-urgent symptoms. For details visit [email or web address].   Also download the MyChart app! Go to the app store, search "MyChart", open the app, select [facility], and log in with your MyChart username and password.  Due to Covid, a mask is required upon entering the hospital/clinic. If you do not have a mask, one will be given to you upon arrival. For doctor visits, patients may have 1 support person aged 18 or older with them. For treatment visits, patients cannot have anyone with them due to current Covid guidelines and our immunocompromised population.  

## 2021-10-15 NOTE — Progress Notes (Signed)
Labs reviewed with MD. Troponin is 22. Ok to treat per MD. Will recheck troponin level in one week per MD.   Treatment given per orders. Patient tolerated it well without problems. Vitals stable and discharged home from clinic ambulatory. Follow up as scheduled.

## 2021-10-15 NOTE — Progress Notes (Signed)
Elizabeth Norman, [place] [address]   CLINIC:  Medical Oncology/Hematology  PCP:  Neale Burly, MD [place] / [place] [address] [phone]   REASON FOR VISIT:  Follow-up for metastatic malignant melanoma, BRAF V600 negative  PRIOR THERAPY: none  NGS Results: BRAF negative, positive for NRAS pathogenic variant exon 3, TERT promoter.  MSI-stable.  MMR-proficient.  [place] 1/2/3 fusion not detected.  KIT mutation negative.  PD-L1 negative.  CURRENT THERAPY: Pembrolizumab (200) q21d Keytruda  BRIEF ONCOLOGIC HISTORY:  Oncology History  Malignant melanoma of lower leg, right ([place])  03/27/2021 Initial Diagnosis   Malignant melanoma of lower leg, right ([place])    05/09/2021 - 05/09/2021 Chemotherapy   Patient is on Treatment Plan : MELANOMA - [place] (nivolumab/relatlimab) q28d      09/03/2021 -  Chemotherapy   Patient is on Treatment Plan : MELANOMA Pembrolizumab (200) q21d        CANCER STAGING:  Cancer Staging  Malignant melanoma of lower leg, right ([place]) Staging form: Melanoma of the Skin, AJCC 8th Edition - Clinical stage from 03/27/2021: Stage IV (cT4a, cN3, cM1) - Unsigned   INTERVAL HISTORY:  [name], a 81 y.o. female, returns for routine follow-up and consideration for next cycle of chemotherapy. Elizabeth Norman was last seen on 09/23/2021.  Due for cycle #3 of Keytruda today.   Overall, she tells me she has been feeling pretty well. She denies CP, cough, SOB, skin rash, arm pains, and fatigue. Her appetite is good, and her activity levels have improved. She has lost 3 lbs since her last visit.   Overall, she feels ready for next cycle of chemo today.    REVIEW OF SYSTEMS:  Review of Systems  Constitutional:  Negative for appetite change and fatigue. Unexpected weight change: -3 lbs. Respiratory:  Negative for cough and shortness of breath.   Cardiovascular:  Negative for chest pain.  Skin:  Negative for rash.   Psychiatric/Behavioral:  Positive for depression and sleep disturbance.   All other systems reviewed and are negative.  PAST MEDICAL/SURGICAL HISTORY:  Past Medical History:  Diagnosis Date   Arthritis    Hypertension    Malignant melanoma of lower leg, right ([facility]) 03/27/2021   Port-A-Cath in place 04/30/2021   Past Surgical History:  Procedure Laterality Date   CATARACT EXTRACTION W/PHACO Right 10/03/2014   Procedure: CATARACT EXTRACTION PHACO AND INTRAOCULAR LENS PLACEMENT ([place]);  Surgeon: [name], MD;  Location: [facility];  Service: Ophthalmology;  Laterality: Right;  CDE:10.09   CATARACT EXTRACTION W/PHACO Left 10/10/2014   Procedure: CATARACT EXTRACTION PHACO AND INTRAOCULAR LENS PLACEMENT ([facility]);  Surgeon: [name], MD;  Location: [facility];  Service: Ophthalmology;  Laterality: Left;  CDE:8.69   CORNEAL 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   CORNEAL TRANSPLANT Left 2020   DENTAL RESTORATION/EXTRACTION WITH X-RAY     IR IMAGING GUIDED PORT INSERTION  04/05/2021   LEFT HEART CATH AND CORONARY ANGIOGRAPHY N/A 05/28/2021   Procedure: LEFT HEART CATH AND CORONARY ANGIOGRAPHY;  Surgeon: Nigel Mormon, MD;  Location: [facility];  Service: Cardiovascular;  Laterality: N/A;   MELANOMA EXCISION Right 02/12/2021   Procedure: WIDE LOCAL EXCISION RIGHT LOWER LEG MELANOMA , ADVANCEMENT FLAP CLOSURE DEFECT;  Surgeon: [name], MD;  Location: [facility];  Service: General;  Laterality: Right;   SENTINEL NODE BIOPSY Right 02/12/2021   Procedure: SENTINEL NODE BIOPSY;  Surgeon: [name], MD;  Location: [facility];  Service: General;  Laterality: Right;    SOCIAL HISTORY:  Social History   Socioeconomic History   Marital status: Widowed    Spouse name: Not on file   Number of children: 3   Years of education: Not on file   Highest education level: Not on file  Occupational History   Not on file  Tobacco Use   Smoking status: Never   Smokeless tobacco: Never  Vaping Use   Vaping  Use: Never used  Substance and Sexual Activity   Alcohol use: No   Drug use: No   Sexual activity: Not on file  Other Topics Concern   Not on file  Social History Narrative   Not on file   Social Determinants of Health   Financial Resource Strain: Not on file  Food Insecurity: Not on file  Transportation Needs: Not on file  Physical Activity: Not on file  Stress: Not on file  Social Connections: Not on file  Intimate Partner Violence: Not on file    FAMILY HISTORY:  Family History  Problem Relation Age of Onset   Cancer Father    Hypertension Sister    Hypertension Sister     CURRENT MEDICATIONS:  Current Outpatient Medications  Medication Sig Dispense Refill   alendronate (FOSAMAX) 70 MG tablet Take 70 mg by mouth once a week.  0   ALPRAZolam (XANAX) 0.5 MG tablet Take 0.5 mg by mouth 3 (three) times daily.  2   aspirin EC 81 MG EC tablet Take 1 tablet (81 mg total) by mouth daily. Swallow whole. 30 tablet 11   calcium carbonate (OS-CAL - DOSED IN MG OF ELEMENTAL CALCIUM) 1250 (500 CA) MG tablet Take 1 tablet by mouth 3 (three) times a week.     cholecalciferol (VITAMIN D) 1000 UNITS tablet Take 1,000 Units by mouth 3 (three) times a week.     Cyanocobalamin (VITAMIN B-12 PO) Take 1 tablet by mouth 3 (three) times a week.     levothyroxine (SYNTHROID) 100 MCG tablet Take 1 tablet (100 mcg total) by mouth daily before breakfast. 30 tablet 3   prednisoLONE acetate (PRED FORTE) 1 % ophthalmic suspension Place 1 drop into both eyes See admin instructions. Instill one drop into the right eye twice daily and one drop into the left eye once daily.     atorvastatin (LIPITOR) 40 MG tablet Take 1 tablet (40 mg total) by mouth daily. 30 tablet 0   metoprolol tartrate (LOPRESSOR) 25 MG tablet Take 1 tablet (25 mg total) by mouth 2 (two) times daily. 60 tablet 0   No current facility-administered medications for this visit.    ALLERGIES:  Allergies  Allergen Reactions   Tape      Pulls skin, please use paper tape    PHYSICAL EXAM:  Performance status (ECOG): 0 - Asymptomatic  Vitals:   10/15/21 0956  BP: 111/67  Pulse: 67  Resp: 17  Temp: 98.7 F (37.1 C)  SpO2: 100%   Wt Readings from Last 3 Encounters:  10/15/21 177 lb 8 oz (80.5 kg)  10/15/21 178 lb 3.2 oz (80.8 kg)  09/23/21 180 lb 3.2 oz (81.7 kg)   Physical Exam Vitals reviewed.  Constitutional:      Appearance: Normal appearance.  Cardiovascular:     Rate and Rhythm: Normal rate and regular rhythm.     Pulses: Normal pulses.     Heart sounds: Normal heart sounds.  Pulmonary:     Effort: Pulmonary effort is normal.  Breath sounds: Normal breath sounds.  Musculoskeletal:     Right lower leg: No edema.     Left lower leg: No edema.  Neurological:     General: No focal deficit present.     Mental Status: She is alert and oriented to person, place, and time.  Psychiatric:        Mood and Affect: Mood normal.        Behavior: Behavior normal.    LABORATORY DATA:  I have reviewed the labs as listed.     Latest Ref Rng & Units 10/15/2021    9:35 AM 09/23/2021    9:55 AM 09/10/2021   10:39 AM  CBC  WBC 4.0 - 10.5 K/uL 4.6   4.8   5.2    Hemoglobin 12.0 - 15.0 g/dL 13.4   12.6   12.6    Hematocrit 36.0 - 46.0 % 40.7   38.2   38.2    Platelets 150 - 400 K/uL 160   155   169        Latest Ref Rng & Units 10/01/2021    8:22 AM 09/23/2021    9:55 AM 09/17/2021    9:37 AM  CMP  Glucose 70 - 99 mg/dL 118   124   125    BUN 8 - 23 mg/dL 9   10   10     Creatinine 0.44 - 1.00 mg/dL 0.77   0.71   0.89    Sodium 135 - 145 mmol/L 138   136   138    Potassium 3.5 - 5.1 mmol/L 3.9   4.1   3.9    Chloride 98 - 111 mmol/L 104   103   103    CO2 22 - 32 mmol/L 30   29   28     Calcium 8.9 - 10.3 mg/dL 8.7   8.8   8.9    Total Protein 6.5 - 8.1 g/dL  6.4   6.6    Total Bilirubin 0.3 - 1.2 mg/dL  0.6   0.9    Alkaline Phos 38 - 126 U/L  79   68    AST 15 - 41 U/L  21   21    ALT 0 - 44 U/L  13   12       DIAGNOSTIC IMAGING:  I have independently reviewed the scans and discussed with the patient. No results found.   ASSESSMENT:  Malignant melanoma of right posterior leg (pT4 pN3 M1): - She noticed fleshy lesion 4 months ago on the right leg. - Her PMD did a biopsy which was consistent with melanoma. - Wide local excision and lymph node biopsy by Dr. Barry Dienes on 02/12/2021. - Pathology margins free, nodular type, Breslow's thickness 9.52 mm, Clark level V, deep margins free, ulceration absent, satellitosis absent, mitotic index 2/millimeters squared, LVI negative.  Neurotropism absent.  Tumor infiltrating lymphocytes absent.  Tumor regression not noted.  3 out of 3 positive sentinel lymph nodes for melanoma. - PET CT scan on 03/22/2021 with 3 foci of intense radiotracer activity within the right lower extremity.  In the medial right upper thigh nodule just superficial to thigh musculature measuring 11 mm with SUV 7.6.  Intramedullary hypermetabolic activity within the shaft of the mid right femur with SUV 14.6.  Intense activity associated with condylar notch of the distal right femur, appears to localize to soft tissue rather than the bone.  More diffuse activity associated with the medial right calf related  to excision biopsy.  There is a focus of activity adjacent to the lateral margin of the right iliac wing SUV 4.1.  No evidence of visceral metastatic disease in the chest, abdomen or pelvis. 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 - NGS testing-BRAF negative, positive for NRAS pathogenic variant exon 3, TERT promoter.  MSI-stable.  MMR-proficient.  [place] 1/2/3 fusion not detected.  KIT mutation negative.  PD-L1 negative. - Nivolumab-Relatlimab every 28 days started on 05/09/2021.  Held permanently after first dose due to  myocarditis. - PET scan on 07/25/2021: Slight progression with no new sites of metastatic disease. - Pembrolizumab started on 09/03/2021    Social/family history: - She is seen with her son today.  She lives by herself at home.  She is independent of ADLs and IADLs.  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  She is non-smoker. - Father had colon cancer.  Sister had breast cancer and colon cancer.  Maternal aunt had breast cancer.  Paternal uncle had colon cancer.   PLAN:  Malignant melanoma of the right posterior leg (pT4 pN3 M1), BRAF V600 negative: - She had 2 cycles of Keytruda without any cardiac issues. - She reports good energy levels.  She is working outside the house.  She lost about 3 pounds since last visit.  No immunotherapy related side effects. - Reviewed labs today which showed normal LFTs creatinine.  CBC was grossly normal. - Proceed with cycle 3 today.  RTC 3 weeks for follow-up. - Recommend ER visit should she develop any generalized body pains or chest pains.  2.  History of immunotherapy myocarditis: - She does not report any chest pains or upper extremity pains since Keytruda was started. - Twelve-lead EKG today shows sinus rhythm with normal QTc. - Troponin I today was slightly elevated at 22. - I plan to check troponin next week.  3.  Bone disease: - She does not report any pain at the site of solitary bone metastasis in the distal right femoral diaphysis.  4.  Hypothyroidism: - Continue Synthroid 100 mcg daily.  TSH today is 8.057.  We will closely monitor.   Orders placed this encounter:  No orders of the defined types were placed in this encounter.    Derek Jack, MD [place] [facility] [phone]   I, Thana Ates, am acting as a scribe for Dr. Derek Jack.  I, Derek Jack MD, have reviewed the above documentation for accuracy and completeness, and I agree with the above.

## 2021-10-15 NOTE — Progress Notes (Signed)
Patients port flushed without difficulty.  Good blood return noted with no bruising or swelling noted at site.  Stable during access and blood draw.  Patient to remain accessed for treatment. 

## 2021-10-15 NOTE — Progress Notes (Signed)
Chaplain engaged in an initial visit with Elizabeth Norman, introducing herself and offering support.     10/15/21 1100  Clinical Encounter Type  Visited With Patient  Visit Type Initial

## 2021-10-21 ENCOUNTER — Inpatient Hospital Stay (HOSPITAL_COMMUNITY): Payer: Medicare Other

## 2021-10-21 ENCOUNTER — Other Ambulatory Visit: Payer: Self-pay

## 2021-10-21 ENCOUNTER — Other Ambulatory Visit (HOSPITAL_COMMUNITY): Payer: Self-pay

## 2021-10-21 ENCOUNTER — Encounter (HOSPITAL_COMMUNITY): Payer: Self-pay

## 2021-10-21 DIAGNOSIS — I409 Acute myocarditis, unspecified: Secondary | ICD-10-CM

## 2021-10-21 DIAGNOSIS — I514 Myocarditis, unspecified: Secondary | ICD-10-CM | POA: Diagnosis not present

## 2021-10-21 DIAGNOSIS — Z5112 Encounter for antineoplastic immunotherapy: Secondary | ICD-10-CM | POA: Diagnosis not present

## 2021-10-21 DIAGNOSIS — C4371 Malignant melanoma of right lower limb, including hip: Secondary | ICD-10-CM | POA: Diagnosis not present

## 2021-10-21 DIAGNOSIS — Z79899 Other long term (current) drug therapy: Secondary | ICD-10-CM | POA: Diagnosis not present

## 2021-10-21 DIAGNOSIS — Z8679 Personal history of other diseases of the circulatory system: Secondary | ICD-10-CM | POA: Diagnosis not present

## 2021-10-21 DIAGNOSIS — R778 Other specified abnormalities of plasma proteins: Secondary | ICD-10-CM | POA: Diagnosis not present

## 2021-10-21 DIAGNOSIS — E039 Hypothyroidism, unspecified: Secondary | ICD-10-CM | POA: Diagnosis not present

## 2021-10-21 DIAGNOSIS — C7951 Secondary malignant neoplasm of bone: Secondary | ICD-10-CM | POA: Diagnosis not present

## 2021-10-21 LAB — COMPREHENSIVE METABOLIC PANEL
ALT: 16 U/L (ref 0–44)
AST: 26 U/L (ref 15–41)
Albumin: 3.5 g/dL (ref 3.5–5.0)
Alkaline Phosphatase: 66 U/L (ref 38–126)
Anion gap: 1 — ABNORMAL LOW (ref 5–15)
BUN: 9 mg/dL (ref 8–23)
CO2: 28 mmol/L (ref 22–32)
Calcium: 8.8 mg/dL — ABNORMAL LOW (ref 8.9–10.3)
Chloride: 106 mmol/L (ref 98–111)
Creatinine, Ser: 0.73 mg/dL (ref 0.44–1.00)
GFR, Estimated: >60 mL/min (ref >60)
Glucose, Bld: 119 mg/dL — ABNORMAL HIGH (ref 70–99)
Potassium: 4.2 mmol/L (ref 3.5–5.1)
Sodium: 135 mmol/L (ref 135–145)
Total Bilirubin: 0.8 mg/dL (ref 0.3–1.2)
Total Protein: 6.1 g/dL — ABNORMAL LOW (ref 6.5–8.1)

## 2021-10-21 LAB — TROPONIN I (HIGH SENSITIVITY): Troponin I (High Sensitivity): 20 ng/L — ABNORMAL HIGH (ref <18)

## 2021-10-21 LAB — CBC WITH DIFFERENTIAL/PLATELET
Abs Immature Granulocytes: 0.01 10*3/uL (ref 0.00–0.07)
Basophils Absolute: 0 10*3/uL (ref 0.0–0.1)
Basophils Relative: 1 %
Eosinophils Absolute: 0.1 10*3/uL (ref 0.0–0.5)
Eosinophils Relative: 3 %
HCT: 38.5 % (ref 36.0–46.0)
Hemoglobin: 12.5 g/dL (ref 12.0–15.0)
Immature Granulocytes: 0 %
Lymphocytes Relative: 26 %
Lymphs Abs: 1.2 10*3/uL (ref 0.7–4.0)
MCH: 30.6 pg (ref 26.0–34.0)
MCHC: 32.5 g/dL (ref 30.0–36.0)
MCV: 94.4 fL (ref 80.0–100.0)
Monocytes Absolute: 0.4 10*3/uL (ref 0.1–1.0)
Monocytes Relative: 8 %
Neutro Abs: 2.7 10*3/uL (ref 1.7–7.7)
Neutrophils Relative %: 62 %
Platelets: 152 10*3/uL (ref 150–400)
RBC: 4.08 MIL/uL (ref 3.87–5.11)
RDW: 11.7 % (ref 11.5–15.5)
WBC: 4.4 10*3/uL (ref 4.0–10.5)
nRBC: 0 % (ref 0.0–0.2)

## 2021-10-21 LAB — BRAIN NATRIURETIC PEPTIDE: B Natriuretic Peptide: 196 pg/mL — ABNORMAL HIGH (ref 0.0–100.0)

## 2021-10-21 MED ORDER — SODIUM CHLORIDE 0.9% FLUSH
10.0000 mL | Freq: Once | INTRAVENOUS | Status: AC
  Administered 2021-10-21: 10 mL via INTRAVENOUS

## 2021-10-21 MED ORDER — HEPARIN SOD (PORK) LOCK FLUSH 100 UNIT/ML IV SOLN
500.0000 [IU] | Freq: Once | INTRAVENOUS | Status: AC
  Administered 2021-10-21: 500 [IU] via INTRAVENOUS

## 2021-10-21 NOTE — Progress Notes (Signed)
Patients port flushed without difficulty.  Good blood return noted with no bruising or swelling noted at site.  Band aid applied.  VSS with discharge and left in satisfactory condition with no s/s of distress noted.     EKG completed and given to Dr. Delton Coombes for review.

## 2021-11-05 ENCOUNTER — Inpatient Hospital Stay (HOSPITAL_COMMUNITY): Payer: Medicare Other

## 2021-11-05 ENCOUNTER — Inpatient Hospital Stay (HOSPITAL_BASED_OUTPATIENT_CLINIC_OR_DEPARTMENT_OTHER): Payer: Medicare Other | Admitting: Hematology

## 2021-11-05 ENCOUNTER — Other Ambulatory Visit: Payer: Self-pay

## 2021-11-05 DIAGNOSIS — C4371 Malignant melanoma of right lower limb, including hip: Secondary | ICD-10-CM

## 2021-11-05 DIAGNOSIS — Z79899 Other long term (current) drug therapy: Secondary | ICD-10-CM | POA: Diagnosis not present

## 2021-11-05 DIAGNOSIS — R778 Other specified abnormalities of plasma proteins: Secondary | ICD-10-CM | POA: Diagnosis not present

## 2021-11-05 DIAGNOSIS — E039 Hypothyroidism, unspecified: Secondary | ICD-10-CM | POA: Diagnosis not present

## 2021-11-05 DIAGNOSIS — C7951 Secondary malignant neoplasm of bone: Secondary | ICD-10-CM | POA: Diagnosis not present

## 2021-11-05 DIAGNOSIS — I409 Acute myocarditis, unspecified: Secondary | ICD-10-CM

## 2021-11-05 DIAGNOSIS — I514 Myocarditis, unspecified: Secondary | ICD-10-CM | POA: Diagnosis not present

## 2021-11-05 DIAGNOSIS — Z8679 Personal history of other diseases of the circulatory system: Secondary | ICD-10-CM | POA: Diagnosis not present

## 2021-11-05 DIAGNOSIS — Z5112 Encounter for antineoplastic immunotherapy: Secondary | ICD-10-CM | POA: Diagnosis not present

## 2021-11-05 DIAGNOSIS — Z298 Encounter for other specified prophylactic measures: Secondary | ICD-10-CM

## 2021-11-05 LAB — COMPREHENSIVE METABOLIC PANEL
ALT: 19 U/L (ref 0–44)
AST: 30 U/L (ref 15–41)
Albumin: 3.6 g/dL (ref 3.5–5.0)
Alkaline Phosphatase: 76 U/L (ref 38–126)
Anion gap: 6 (ref 5–15)
BUN: 9 mg/dL (ref 8–23)
CO2: 28 mmol/L (ref 22–32)
Calcium: 8.7 mg/dL — ABNORMAL LOW (ref 8.9–10.3)
Chloride: 102 mmol/L (ref 98–111)
Creatinine, Ser: 0.73 mg/dL (ref 0.44–1.00)
GFR, Estimated: >60 mL/min (ref >60)
Glucose, Bld: 110 mg/dL — ABNORMAL HIGH (ref 70–99)
Potassium: 4 mmol/L (ref 3.5–5.1)
Sodium: 136 mmol/L (ref 135–145)
Total Bilirubin: 0.9 mg/dL (ref 0.3–1.2)
Total Protein: 6.3 g/dL — ABNORMAL LOW (ref 6.5–8.1)

## 2021-11-05 LAB — CBC WITH DIFFERENTIAL/PLATELET
Abs Immature Granulocytes: 0.02 10*3/uL (ref 0.00–0.07)
Basophils Absolute: 0 10*3/uL (ref 0.0–0.1)
Basophils Relative: 1 %
Eosinophils Absolute: 0.1 10*3/uL (ref 0.0–0.5)
Eosinophils Relative: 2 %
HCT: 39.3 % (ref 36.0–46.0)
Hemoglobin: 12.8 g/dL (ref 12.0–15.0)
Immature Granulocytes: 0 %
Lymphocytes Relative: 25 %
Lymphs Abs: 1.3 10*3/uL (ref 0.7–4.0)
MCH: 30.1 pg (ref 26.0–34.0)
MCHC: 32.6 g/dL (ref 30.0–36.0)
MCV: 92.5 fL (ref 80.0–100.0)
Monocytes Absolute: 0.4 10*3/uL (ref 0.1–1.0)
Monocytes Relative: 8 %
Neutro Abs: 3.2 10*3/uL (ref 1.7–7.7)
Neutrophils Relative %: 64 %
Platelets: 149 10*3/uL — ABNORMAL LOW (ref 150–400)
RBC: 4.25 MIL/uL (ref 3.87–5.11)
RDW: 11.9 % (ref 11.5–15.5)
WBC: 5.1 10*3/uL (ref 4.0–10.5)
nRBC: 0 % (ref 0.0–0.2)

## 2021-11-05 LAB — BRAIN NATRIURETIC PEPTIDE: B Natriuretic Peptide: 230 pg/mL — ABNORMAL HIGH (ref 0.0–100.0)

## 2021-11-05 LAB — LACTATE DEHYDROGENASE: LDH: 168 U/L (ref 98–192)

## 2021-11-05 LAB — MAGNESIUM: Magnesium: 1.8 mg/dL (ref 1.7–2.4)

## 2021-11-05 LAB — TSH: TSH: 3.984 u[IU]/mL (ref 0.350–4.500)

## 2021-11-05 LAB — TROPONIN I (HIGH SENSITIVITY): Troponin I (High Sensitivity): 145 ng/L (ref <18)

## 2021-11-05 NOTE — Progress Notes (Signed)
CRITICAL VALUE ALERT Critical value received:  troponin level- 145 Date of notification:  11-05-21 Time of notification: 0950 Critical value read back:  Yes.   Nurse who received alert:  C. Andrw Mcguirt RN MD notified time and response:  [phone].  No treatment today per MD.

## 2021-11-05 NOTE — Progress Notes (Signed)
[facility] [address], [place] [address]   CLINIC:  Medical Oncology/Hematology  PCP:  [name], MD [address] / [place] [id number] [phone]   REASON FOR VISIT:  Follow-up for metastatic malignant melanoma, BRAF V600 negative  PRIOR THERAPY: none  NGS Results: BRAF negative, positive for NRAS pathogenic variant exon 3, TERT promoter.  MSI-stable.  MMR-proficient.  NTRK 1/2/3 fusion not detected.  KIT mutation negative.  PD-L1 negative.  CURRENT THERAPY: Pembrolizumab (200) q21d Keytruda  BRIEF ONCOLOGIC HISTORY:  Oncology History  Malignant melanoma of lower leg, right (HCC)  03/27/2021 Initial Diagnosis   Malignant melanoma of lower leg, right (HCC)   05/09/2021 - 05/09/2021 Chemotherapy   Patient is on Treatment Plan : MELANOMA - Opdualag (nivolumab/relatlimab) q28d     09/03/2021 -  Chemotherapy   Patient is on Treatment Plan : MELANOMA Pembrolizumab (200) q21d       CANCER STAGING:  Cancer Staging  Malignant melanoma of lower leg, right (HCC) Staging form: Melanoma of the Skin, AJCC 8th Edition - Clinical stage from 03/27/2021: Stage IV (cT4a, cN3, cM1) - Unsigned   INTERVAL HISTORY:  Elizabeth Norman, a 81 y.o. female, returns for routine follow-up and consideration for next cycle of chemotherapy. Elizabeth Norman was last seen on 10/15/2021.  Due for cycle #4 of Keytruda today.   Overall, she tells me she has been feeling pretty well. She reports occasional numbness and pain in her right thigh which she reports has been occurring intermittently prior to start of treatment. She denies CP and new body pains. Her appetite is good.   Overall, she will not receive her next cycle of chemo today.    REVIEW OF SYSTEMS:  Review of Systems  Constitutional:  Negative for appetite change and fatigue.  Cardiovascular:  Negative for chest pain.  Gastrointestinal:  Positive for constipation.  Musculoskeletal:  Negative for arthralgias.   Neurological:  Positive for numbness (R leg).  Psychiatric/Behavioral:  Positive for sleep disturbance.   All other systems reviewed and are negative.   PAST MEDICAL/SURGICAL HISTORY:  Past Medical History:  Diagnosis Date   Arthritis    Hypertension    Malignant melanoma of lower leg, right (HCC) 03/27/2021   Port-A-Cath in place 04/30/2021   Past Surgical History:  Procedure Laterality Date   CATARACT EXTRACTION W/PHACO Right 10/03/2014   Procedure: CATARACT EXTRACTION PHACO AND INTRAOCULAR LENS PLACEMENT (IOC);  Surgeon: Jethro Bolus, MD;  Location: [facility];  Service: Ophthalmology;  Laterality: Right;  CDE:10.09   CATARACT EXTRACTION W/PHACO Left 10/10/2014   Procedure: CATARACT EXTRACTION PHACO AND INTRAOCULAR LENS PLACEMENT (IOC);  Surgeon: Jethro Bolus, MD;  Location: [facility];  Service: Ophthalmology;  Laterality: Left;  CDE:8.69   CORNEAL 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   CORNEAL TRANSPLANT Left 2020   DENTAL RESTORATION/EXTRACTION WITH X-RAY     IR IMAGING GUIDED PORT INSERTION  04/05/2021   LEFT HEART CATH AND CORONARY ANGIOGRAPHY N/A 05/28/2021   Procedure: LEFT HEART CATH AND CORONARY ANGIOGRAPHY;  Surgeon: [name], MD;  Location: [facility];  Service: Cardiovascular;  Laterality: N/A;   MELANOMA EXCISION Right 02/12/2021   Procedure: WIDE LOCAL EXCISION RIGHT LOWER LEG MELANOMA , ADVANCEMENT FLAP CLOSURE DEFECT;  Surgeon: Almond Lint, MD;  Location: [facility];  Service: General;  Laterality: Right;   SENTINEL NODE BIOPSY Right 02/12/2021   Procedure: SENTINEL NODE BIOPSY;  Surgeon: Almond Lint, MD;  Location: [facility];  Service: General;  Laterality: Right;    SOCIAL HISTORY:  Social History   Socioeconomic History   Marital status: Widowed    Spouse name: Not on file   Number of children: 3   Years of education: Not on file   Highest education level: Not on file  Occupational History   Not on file  Tobacco Use   Smoking status: Never   Smokeless  tobacco: Never  Vaping Use   Vaping Use: Never used  Substance and Sexual Activity   Alcohol use: No   Drug use: No   Sexual activity: Not on file  Other Topics Concern   Not on file  Social History Narrative   Not on file   Social Determinants of Health   Financial Resource Strain: Not on file  Food Insecurity: Not on file  Transportation Needs: Not on file  Physical Activity: Not on file  Stress: Not on file  Social Connections: Not on file  Intimate Partner Violence: Not on file    FAMILY HISTORY:  Family History  Problem Relation Age of Onset   Cancer Father    Hypertension Sister    Hypertension Sister     CURRENT MEDICATIONS:  Current Outpatient Medications  Medication Sig Dispense Refill   alendronate (FOSAMAX) 70 MG tablet Take 70 mg by mouth once a week.  0   ALPRAZolam (XANAX) 0.5 MG tablet Take 0.5 mg by mouth 3 (three) times daily.  2   aspirin EC 81 MG EC tablet Take 1 tablet (81 mg total) by mouth daily. Swallow whole. 30 tablet 11   atorvastatin (LIPITOR) 40 MG tablet Take 1 tablet (40 mg total) by mouth daily. 30 tablet 0   calcium carbonate (OS-CAL - DOSED IN MG OF ELEMENTAL CALCIUM) 1250 (500 CA) MG tablet Take 1 tablet by mouth 3 (three) times a week.     cholecalciferol (VITAMIN D) 1000 UNITS tablet Take 1,000 Units by mouth 3 (three) times a week.     Cyanocobalamin (VITAMIN B-12 PO) Take 1 tablet by mouth 3 (three) times a week.     levothyroxine (SYNTHROID) 100 MCG tablet Take 1 tablet (100 mcg total) by mouth daily before breakfast. 30 tablet 3   metoprolol tartrate (LOPRESSOR) 25 MG tablet Take 1 tablet (25 mg total) by mouth 2 (two) times daily. 60 tablet 0   prednisoLONE acetate (PRED FORTE) 1 % ophthalmic suspension Place 1 drop into both eyes See admin instructions. Instill one drop into the right eye twice daily and one drop into the left eye once daily.     No current facility-administered medications for this visit.    ALLERGIES:   Allergies  Allergen Reactions   Tape     Pulls skin, please use paper tape    PHYSICAL EXAM:  Performance status (ECOG): 0 - Asymptomatic  There were no vitals filed for this visit. Wt Readings from Last 3 Encounters:  10/15/21 177 lb 8 oz (80.5 kg)  10/15/21 178 lb 3.2 oz (80.8 kg)  09/23/21 180 lb 3.2 oz (81.7 kg)   Physical Exam Vitals reviewed.  Constitutional:      Appearance: Normal appearance.  Cardiovascular:     Rate and Rhythm: Normal rate and regular rhythm.     Pulses: Normal pulses.     Heart sounds: Normal heart sounds.  Pulmonary:     Effort: Pulmonary effort is normal.     Breath sounds: Normal breath sounds.  Neurological:     General: No focal deficit present.  Mental Status: She is alert and oriented to person, place, and time.  Psychiatric:        Mood and Affect: Mood normal.        Behavior: Behavior normal.     LABORATORY DATA:  I have reviewed the labs as listed.     Latest Ref Rng & Units 10/21/2021   11:26 AM 10/15/2021    9:35 AM 09/23/2021    9:55 AM  CBC  WBC 4.0 - 10.5 K/uL 4.4  4.6  4.8   Hemoglobin 12.0 - 15.0 g/dL 11.9  14.7  82.9   Hematocrit 36.0 - 46.0 % 38.5  40.7  38.2   Platelets 150 - 400 K/uL 152  160  155       Latest Ref Rng & Units 10/21/2021   11:26 AM 10/15/2021    9:35 AM 10/01/2021    8:22 AM  CMP  Glucose 70 - 99 mg/dL 562  130  865   BUN 8 - 23 mg/dL 9  10  9    Creatinine 0.44 - 1.00 mg/dL 7.84  6.96  2.95   Sodium 135 - 145 mmol/L 135  139  138   Potassium 3.5 - 5.1 mmol/L 4.2  4.2  3.9   Chloride 98 - 111 mmol/L 106  103  104   CO2 22 - 32 mmol/L 28  27  30    Calcium 8.9 - 10.3 mg/dL 8.8  9.3  8.7   Total Protein 6.5 - 8.1 g/dL 6.1  6.5    Total Bilirubin 0.3 - 1.2 mg/dL 0.8  0.7    Alkaline Phos 38 - 126 U/L 66  73    AST 15 - 41 U/L 26  26    ALT 0 - 44 U/L 16  16      DIAGNOSTIC IMAGING:  I have independently reviewed the scans and discussed with the patient. No results found.   ASSESSMENT:   Malignant melanoma of right posterior leg (pT4 pN3 M1): - She noticed fleshy lesion 4 months ago on the right leg. - Her PMD did a biopsy which was consistent with melanoma. - Wide local excision and lymph node biopsy by Dr. Donell Beers on 02/12/2021. - Pathology margins free, nodular type, Breslow's thickness 9.52 mm, Clark level V, deep margins free, ulceration absent, satellitosis absent, mitotic index 2/millimeters squared, LVI negative.  Neurotropism absent.  Tumor infiltrating lymphocytes absent.  Tumor regression not noted.  3 out of 3 positive sentinel lymph nodes for melanoma. - PET CT scan on 03/22/2021 with 3 foci of intense radiotracer activity within the right lower extremity.  In the medial right upper thigh nodule just superficial to thigh musculature measuring 11 mm with SUV 7.6.  Intramedullary hypermetabolic activity within the shaft of the mid right femur with SUV 14.6.  Intense activity associated with condylar notch of the distal right femur, appears to localize to soft tissue rather than the bone.  More diffuse activity associated with the medial right calf related to excision biopsy.  There is a focus of activity adjacent to the lateral margin of the right iliac wing SUV 4.1.  No evidence of visceral metastatic disease in the chest, abdomen or pelvis. 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 - NGS testing-BRAF negative, positive for NRAS pathogenic variant exon 3, TERT promoter.  MSI-stable.  MMR-proficient.  NTRK 1/2/3 fusion not detected.  KIT mutation negative.  PD-L1 negative. - Nivolumab-Relatlimab every 28 days started on 05/09/2021.  Held permanently after first dose due to myocarditis. - PET scan on 07/25/2021: Slight progression with no new sites of metastatic disease. - Pembrolizumab started on 09/03/2021     Social/family history: - She is seen with her son today.  She lives by herself at home.  She is independent of ADLs and IADLs.  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  She is non-smoker. - Father had colon cancer.  Sister had breast cancer and colon cancer.  Maternal aunt had breast cancer.  Paternal uncle had colon cancer.   PLAN:  Malignant melanoma of the right posterior leg (pT4 pN3 M1), BRAF V600 negative: - She has completed 3 cycles of Keytruda.  She does not report any immunotherapy related side effects. - Reviewed labs today which showed normal LFTs and creatinine.  CBC was grossly normal. - As her troponin was elevated, I would hold her Keytruda today.  I have recommended restaging PET CT scan.  RTC after scan.  2.  History of immunotherapy myocarditis: - She does not have any chest pains or upper extremity pains since Keytruda was started.  She received 3 cycles. - Twelve-lead EKG in the office today did not show any major changes.  QTc was normal. - Troponin I was elevated at 145, up from 20.  BNP was 230.  I would recommend holding Keytruda today.  Will reevaluate troponin next week.  I have recommended her to go to ER should she develop any chest pain.  3.  Right medial thigh pain: - She reported some pain/tenderness in the right medial thigh region.  No mass was felt.  Previous PET scan showed uptake in this area.  We will follow-up on subsequent PET scan.  4.  Hypothyroidism: - Continue Synthroid 100 mcg daily.  TSH today is 3.9.   Orders placed this encounter:  No orders of the defined types were placed in this encounter.    [name], MD [facility] [phone]   I, Alda Ponder, am acting as a scribe for Dr. [name].  I, [name] MD, have reviewed the above documentation for accuracy and completeness, and I agree with the above.

## 2021-11-14 ENCOUNTER — Other Ambulatory Visit (HOSPITAL_COMMUNITY): Payer: Self-pay | Admitting: Hematology

## 2021-11-14 ENCOUNTER — Encounter (HOSPITAL_COMMUNITY)
Admission: RE | Admit: 2021-11-14 | Discharge: 2021-11-14 | Disposition: A | Discharge status: Home / Self Care | Payer: Medicare Other | Source: Ambulatory Visit | Attending: Hematology | Admitting: Hematology

## 2021-11-14 DIAGNOSIS — C4371 Malignant melanoma of right lower limb, including hip: Secondary | ICD-10-CM | POA: Diagnosis not present

## 2021-11-14 DIAGNOSIS — C4339 Malignant melanoma of other parts of face: Secondary | ICD-10-CM | POA: Diagnosis not present

## 2021-11-14 MED ORDER — FLUDEOXYGLUCOSE F - 18 (FDG) INJECTION
9.4600 | Freq: Once | INTRAVENOUS | Status: AC | PRN
  Administered 2021-11-14: 9.46 via INTRAVENOUS

## 2021-11-15 ENCOUNTER — Telehealth (HOSPITAL_COMMUNITY): Payer: Self-pay | Admitting: *Deleted

## 2021-11-15 NOTE — Telephone Encounter (Signed)
[facility] called report for recent PET scan. Results printed and given to [place].

## 2021-11-19 ENCOUNTER — Inpatient Hospital Stay (HOSPITAL_COMMUNITY): Payer: Medicare Other

## 2021-11-19 ENCOUNTER — Inpatient Hospital Stay (HOSPITAL_COMMUNITY): Payer: Medicare Other | Attending: Hematology | Admitting: Hematology

## 2021-11-19 VITALS — BP 137/73 | HR 68 | Temp 97.7°F | Resp 16 | Wt 177.5 lb

## 2021-11-19 DIAGNOSIS — I700 Atherosclerosis of aorta: Secondary | ICD-10-CM | POA: Diagnosis not present

## 2021-11-19 DIAGNOSIS — C4371 Malignant melanoma of right lower limb, including hip: Secondary | ICD-10-CM | POA: Insufficient documentation

## 2021-11-19 DIAGNOSIS — Z5112 Encounter for antineoplastic immunotherapy: Secondary | ICD-10-CM | POA: Diagnosis not present

## 2021-11-19 DIAGNOSIS — C439 Malignant melanoma of skin, unspecified: Secondary | ICD-10-CM | POA: Diagnosis not present

## 2021-11-19 DIAGNOSIS — Z8679 Personal history of other diseases of the circulatory system: Secondary | ICD-10-CM | POA: Insufficient documentation

## 2021-11-19 DIAGNOSIS — I514 Myocarditis, unspecified: Secondary | ICD-10-CM | POA: Diagnosis not present

## 2021-11-19 DIAGNOSIS — Z800 Family history of malignant neoplasm of digestive organs: Secondary | ICD-10-CM | POA: Insufficient documentation

## 2021-11-19 DIAGNOSIS — Z803 Family history of malignant neoplasm of breast: Secondary | ICD-10-CM | POA: Insufficient documentation

## 2021-11-19 DIAGNOSIS — Z79899 Other long term (current) drug therapy: Secondary | ICD-10-CM | POA: Diagnosis not present

## 2021-11-19 DIAGNOSIS — C7951 Secondary malignant neoplasm of bone: Secondary | ICD-10-CM | POA: Insufficient documentation

## 2021-11-19 DIAGNOSIS — I10 Essential (primary) hypertension: Secondary | ICD-10-CM | POA: Insufficient documentation

## 2021-11-19 DIAGNOSIS — I409 Acute myocarditis, unspecified: Secondary | ICD-10-CM

## 2021-11-19 DIAGNOSIS — E039 Hypothyroidism, unspecified: Secondary | ICD-10-CM | POA: Diagnosis not present

## 2021-11-19 DIAGNOSIS — Z298 Encounter for other specified prophylactic measures: Secondary | ICD-10-CM

## 2021-11-19 LAB — COMPREHENSIVE METABOLIC PANEL
ALT: 44 U/L (ref 0–44)
AST: 41 U/L (ref 15–41)
Albumin: 3.8 g/dL (ref 3.5–5.0)
Alkaline Phosphatase: 137 U/L — ABNORMAL HIGH (ref 38–126)
Anion gap: 6 (ref 5–15)
BUN: 13 mg/dL (ref 8–23)
CO2: 28 mmol/L (ref 22–32)
Calcium: 9.1 mg/dL (ref 8.9–10.3)
Chloride: 102 mmol/L (ref 98–111)
Creatinine, Ser: 0.73 mg/dL (ref 0.44–1.00)
GFR, Estimated: >60 mL/min (ref >60)
Glucose, Bld: 133 mg/dL — ABNORMAL HIGH (ref 70–99)
Potassium: 4.7 mmol/L (ref 3.5–5.1)
Sodium: 136 mmol/L (ref 135–145)
Total Bilirubin: 1 mg/dL (ref 0.3–1.2)
Total Protein: 6.5 g/dL (ref 6.5–8.1)

## 2021-11-19 LAB — CBC WITH DIFFERENTIAL/PLATELET
Abs Immature Granulocytes: 0.02 10*3/uL (ref 0.00–0.07)
Basophils Absolute: 0 10*3/uL (ref 0.0–0.1)
Basophils Relative: 0 %
Eosinophils Absolute: 0.1 10*3/uL (ref 0.0–0.5)
Eosinophils Relative: 2 %
HCT: 39.9 % (ref 36.0–46.0)
Hemoglobin: 12.8 g/dL (ref 12.0–15.0)
Immature Granulocytes: 0 %
Lymphocytes Relative: 25 %
Lymphs Abs: 1.3 10*3/uL (ref 0.7–4.0)
MCH: 29.7 pg (ref 26.0–34.0)
MCHC: 32.1 g/dL (ref 30.0–36.0)
MCV: 92.6 fL (ref 80.0–100.0)
Monocytes Absolute: 0.4 10*3/uL (ref 0.1–1.0)
Monocytes Relative: 9 %
Neutro Abs: 3.3 10*3/uL (ref 1.7–7.7)
Neutrophils Relative %: 64 %
Platelets: 156 10*3/uL (ref 150–400)
RBC: 4.31 MIL/uL (ref 3.87–5.11)
RDW: 12.1 % (ref 11.5–15.5)
WBC: 5.1 10*3/uL (ref 4.0–10.5)
nRBC: 0 % (ref 0.0–0.2)

## 2021-11-19 LAB — BRAIN NATRIURETIC PEPTIDE: B Natriuretic Peptide: 276 pg/mL — ABNORMAL HIGH (ref 0.0–100.0)

## 2021-11-19 LAB — MAGNESIUM: Magnesium: 2.1 mg/dL (ref 1.7–2.4)

## 2021-11-19 LAB — TROPONIN I (HIGH SENSITIVITY): Troponin I (High Sensitivity): 13 ng/L (ref <18)

## 2021-11-19 MED ORDER — HEPARIN SOD (PORK) LOCK FLUSH 100 UNIT/ML IV SOLN
500.0000 [IU] | Freq: Once | INTRAVENOUS | Status: AC
  Administered 2021-11-19: 500 [IU] via INTRAVENOUS

## 2021-11-19 MED ORDER — SODIUM CHLORIDE 0.9% FLUSH
10.0000 mL | Freq: Once | INTRAVENOUS | Status: AC
  Administered 2021-11-19: 10 mL via INTRAVENOUS

## 2021-11-19 NOTE — Progress Notes (Signed)
[place], [place] [address]   CLINIC:  Medical Oncology/Hematology  PCP:  Neale Burly, MD [place] / [place] [address] [phone]   REASON FOR VISIT:  Follow-up for metastatic malignant melanoma, BRAF V600 negative  PRIOR THERAPY: none  NGS Results: BRAF negative, positive for NRAS pathogenic variant exon 3, TERT promoter.  MSI-stable.  MMR-proficient.  [place] 1/2/3 fusion not detected.  KIT mutation negative.  PD-L1 negative.  CURRENT THERAPY: Pembrolizumab (200) q21d Keytruda  BRIEF ONCOLOGIC HISTORY:  Oncology History  Malignant melanoma of lower leg, right ([facility])  03/27/2021 Initial Diagnosis   Malignant melanoma of lower leg, right ([place])   05/09/2021 - 05/09/2021 Chemotherapy   Patient is on Treatment Plan : MELANOMA - [place] (nivolumab/relatlimab) q28d     09/03/2021 -  Chemotherapy   Patient is on Treatment Plan : MELANOMA Pembrolizumab (200) q21d       CANCER STAGING:  Cancer Staging  Malignant melanoma of lower leg, right ([place]) Staging form: Melanoma of the Skin, AJCC 8th Edition - Clinical stage from 03/27/2021: Stage IV (cT4a, cN3, cM1) - Unsigned   INTERVAL HISTORY:  Elizabeth Norman, a 81 y.o. female, returns for routine follow-up of her metastatic malignant melanoma, BRAF V600 negative. Elizabeth Norman was last seen on 11/05/2021.   Today she reports feeling good, and she is accompanied by her son. She denies right mid-thigh pain.   REVIEW OF SYSTEMS:  Review of Systems  Constitutional:  Negative for appetite change and fatigue.  Psychiatric/Behavioral:  Positive for sleep disturbance.   All other systems reviewed and are negative.   PAST MEDICAL/SURGICAL HISTORY:  Past Medical History:  Diagnosis Date   Arthritis    Hypertension    Malignant melanoma of lower leg, right (Elizabeth Norman) 03/27/2021   Port-A-Cath in place 04/30/2021   Past Surgical History:  Procedure Laterality Date   CATARACT EXTRACTION  W/PHACO Right 10/03/2014   Procedure: CATARACT EXTRACTION PHACO AND INTRAOCULAR LENS PLACEMENT ([place]);  Surgeon: [name], MD;  Location: [facility];  Service: Ophthalmology;  Laterality: Right;  CDE:10.09   CATARACT EXTRACTION W/PHACO Left 10/10/2014   Procedure: CATARACT EXTRACTION PHACO AND INTRAOCULAR LENS PLACEMENT ([facility]);  Surgeon: [name], MD;  Location: [facility];  Service: Ophthalmology;  Laterality: Left;  CDE:8.69   CORNEAL 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   CORNEAL TRANSPLANT Left 2020   DENTAL RESTORATION/EXTRACTION WITH X-RAY     IR IMAGING GUIDED PORT INSERTION  04/05/2021   LEFT HEART CATH AND CORONARY ANGIOGRAPHY N/A 05/28/2021   Procedure: LEFT HEART CATH AND CORONARY ANGIOGRAPHY;  Surgeon: Nigel Mormon, MD;  Location: [facility];  Service: Cardiovascular;  Laterality: N/A;   MELANOMA EXCISION Right 02/12/2021   Procedure: WIDE LOCAL EXCISION RIGHT LOWER LEG MELANOMA , ADVANCEMENT FLAP CLOSURE DEFECT;  Surgeon: [name], MD;  Location: [facility];  Service: General;  Laterality: Right;   SENTINEL NODE BIOPSY Right 02/12/2021   Procedure: SENTINEL NODE BIOPSY;  Surgeon: [name], MD;  Location: [facility];  Service: General;  Laterality: Right;    SOCIAL HISTORY:  Social History   Socioeconomic History   Marital status: Widowed    Spouse name: Not on file   Number of children: 3   Years of education: Not on file   Highest education level: Not on file  Occupational History   Not on file  Tobacco Use   Smoking status: Never   Smokeless tobacco: Never  Vaping Use  Vaping Use: Never used  Substance and Sexual Activity   Alcohol use: No   Drug use: No   Sexual activity: Not on file  Other Topics Concern   Not on file  Social History Narrative   Not on file   Social Determinants of Health   Financial Resource Strain: Not on file  Food Insecurity: Not on file  Transportation Needs: Not on file  Physical Activity: Not on file  Stress: Not on file  Social  Connections: Not on file  Intimate Partner Violence: Not on file    FAMILY HISTORY:  Family History  Problem Relation Age of Onset   Cancer Father    Hypertension Sister    Hypertension Sister     CURRENT MEDICATIONS:  Current Outpatient Medications  Medication Sig Dispense Refill   alendronate (FOSAMAX) 70 MG tablet Take 70 mg by mouth once a week.  0   ALPRAZolam (XANAX) 0.5 MG tablet Take 0.5 mg by mouth 3 (three) times daily.  2   aspirin EC 81 MG EC tablet Take 1 tablet (81 mg total) by mouth daily. Swallow whole. 30 tablet 11   calcium carbonate (OS-CAL - DOSED IN MG OF ELEMENTAL CALCIUM) 1250 (500 CA) MG tablet Take 1 tablet by mouth 3 (three) times a week.     cholecalciferol (VITAMIN D) 1000 UNITS tablet Take 1,000 Units by mouth 3 (three) times a week.     Cyanocobalamin (VITAMIN B-12 PO) Take 1 tablet by mouth 3 (three) times a week.     levothyroxine (SYNTHROID) 100 MCG tablet Take 1 tablet (100 mcg total) by mouth daily before breakfast. 30 tablet 3   prednisoLONE acetate (PRED FORTE) 1 % ophthalmic suspension Place 1 drop into both eyes See admin instructions. Instill one drop into the right eye twice daily and one drop into the left eye once daily.     atorvastatin (LIPITOR) 40 MG tablet Take 1 tablet (40 mg total) by mouth daily. 30 tablet 0   metoprolol tartrate (LOPRESSOR) 25 MG tablet Take 1 tablet (25 mg total) by mouth 2 (two) times daily. 60 tablet 0   No current facility-administered medications for this visit.    ALLERGIES:  Allergies  Allergen Reactions   Tape     Pulls skin, please use paper tape    PHYSICAL EXAM:  Performance status (ECOG): 0 - Asymptomatic  Vitals:   11/19/21 1415  BP: 137/73  Pulse: 68  Resp: 16  Temp: 97.7 F (36.5 C)  SpO2: 94%   Wt Readings from Last 3 Encounters:  11/19/21 177 lb 8 oz (80.5 kg)  10/15/21 177 lb 8 oz (80.5 kg)  10/15/21 178 lb 3.2 oz (80.8 kg)   Physical Exam   LABORATORY DATA:  I have reviewed  the labs as listed.     Latest Ref Rng & Units 11/19/2021   12:45 PM 11/05/2021    8:56 AM 10/21/2021   11:26 AM  CBC  WBC 4.0 - 10.5 K/uL 5.1  5.1  4.4   Hemoglobin 12.0 - 15.0 g/dL 12.8  12.8  12.5   Hematocrit 36.0 - 46.0 % 39.9  39.3  38.5   Platelets 150 - 400 K/uL 156  149  152       Latest Ref Rng & Units 11/19/2021   12:45 PM 11/05/2021    8:56 AM 10/21/2021   11:26 AM  CMP  Glucose 70 - 99 mg/dL 133  110  119   BUN 8 - 23  mg/dL 13  9  9    Creatinine 0.44 - 1.00 mg/dL 0.73  0.73  0.73   Sodium 135 - 145 mmol/L 136  136  135   Potassium 3.5 - 5.1 mmol/L 4.7  4.0  4.2   Chloride 98 - 111 mmol/L 102  102  106   CO2 22 - 32 mmol/L 28  28  28    Calcium 8.9 - 10.3 mg/dL 9.1  8.7  8.8   Total Protein 6.5 - 8.1 g/dL 6.5  6.3  6.1   Total Bilirubin 0.3 - 1.2 mg/dL 1.0  0.9  0.8   Alkaline Phos 38 - 126 U/L 137  76  66   AST 15 - 41 U/L 41  30  26   ALT 0 - 44 U/L 44  19  16     DIAGNOSTIC IMAGING:  I have independently reviewed the scans and discussed with the patient. NM PET Image Restage (PS) Whole Body (F-18 FDG)  Result Date: 11/15/2021 CLINICAL DATA:  Subsequent treatment strategy for melanoma. Restaging, currently on immunotherapy. EXAM: NUCLEAR MEDICINE PET WHOLE BODY TECHNIQUE: 9.46 mCi F-18 FDG was injected intravenously. Full-ring PET imaging was performed from the head to foot after the radiotracer. CT data was obtained and used for attenuation correction and anatomic localization. Fasting blood glucose: 108 mg/dl COMPARISON:  Multiple priors including most recent PET-CT July 25, 2021 FINDINGS: Mediastinal blood pool activity: SUV max 2.6 HEAD/NECK: Small hypermetabolic soft tissue lesion in the deep right parotid gland measures 8 mm on image 66/3 with a max SUV of 4.9 previously measuring 1 cm with a max SUV of 7.1. Homogeneous mildly hypermetabolic activity throughout the thyroid gland is similar prior with a max SUV of 3.4. No hypermetabolic cervical adenopathy. Incidental  CT findings: Streak artifact from dental hardware. Bilateral tonsilloliths. Carotid artery calcifications. CHEST: No hypermetabolic thoracic adenopathy. No FDG avid pulmonary nodules or masses. Incidental CT findings: Right chest wall Port-A-Cath with tip at the superior cavoatrial junction. Mitral annular calcifications. Coronary artery calcifications. Aortic atherosclerosis. No suspicious pulmonary nodules or masses on this motion degraded examination. ABDOMEN/PELVIS: No abnormal hypermetabolic activity within the liver, pancreas, adrenal glands, or spleen. No hypermetabolic lymph nodes in the abdomen or pelvis. No significant interval change in the heterogeneous nodular masslike area posterior to the rectum which demonstrates some internal macroscopic fat and mildly metabolic internal soft tissue components measuring 6.9 x 5.6 cm in maximum axial dimension on image 253/3 with a max SUV of 2.3 previously measuring 6.6 x 6.0 cm with a max SUV of 2.9. The 2 hypermetabolic lymph nodes in the right groin region adjacent to the lymphadenectomy clips are now too small to accurately characterize previously measuring 9 mm with a max SUV of 12.6 and 5 mm with a max SUV of 4.8. Significant interval decrease in size and hypermetabolic activity in the small subcutaneous soft tissue nodule in the medial right thigh which now measures 1 cm on image 325/3 with a max SUV 2.0 previously measuring 1.5 cm with a max SUV of 11.3. Incidental CT findings: Cholelithiasis without findings of acute cholecystitis. Colonic diverticulosis without findings of acute diverticulitis. Aortic atherosclerosis. SKELETON: Interval resolution of the hypermetabolic focus in the mid shaft right femur previously with a max SUV of 13.3. Incidental CT findings: none EXTREMITIES: Similar low-level hypermetabolic activity associated with the postsurgical change in the medial right calf with a max SUV of 2.2 without new discrete hypermetabolic soft tissue  nodularity in this region. Focus of  hypermetabolic activity in the posterior right femoral intracondylar notch demonstrates a max SUV of 4.5 previously 4.3 and favored inflammatory. Incidental CT findings: none IMPRESSION: 1. Significant interval decrease in size of the now mildly metabolic soft tissue nodule in the right thigh with interval resolution of the hypermetabolic right inguinal lymph nodes and focus of hypermetabolic activity in the intramedullary distal right femur, consistent with treatment response. 2. No new or progressive hypermetabolic lesions suspicious for progressive metastatic melanoma. 3. Mildly metabolic heterogeneous macroscopic fat containing masslike area posterior to the rectum is not significantly changed dating back to March 22, 2021, suspicious for low-grade liposarcoma or sacrococcygeal teratoma, consider further evaluation with direct tissue sampling. 4. Similar focal hypermetabolic activity in the deep right parotid gland which is favored to reflect a primary parotid neoplasm. Consider ENT follow-up. 5. Homogeneous mild metabolic activity throughout the thyroid gland is similar prior and may reflect thyroiditis consider correlation with laboratory values. These results will be called to the ordering clinician or representative by the Radiologist Assistant, and communication documented in the PACS or [employer]. Electronically Signed   By: Dahlia Bailiff M.D.   On: 11/15/2021 09:02     ASSESSMENT:  Malignant melanoma of right posterior leg (pT4 pN3 M1): - She noticed fleshy lesion 4 months ago on the right leg. - Her PMD did a biopsy which was consistent with melanoma. - Wide local excision and lymph node biopsy by Dr. Barry Dienes on 02/12/2021. - Pathology margins free, nodular type, Breslow's thickness 9.52 mm, Clark level V, deep margins free, ulceration absent, satellitosis absent, mitotic index 2/millimeters squared, LVI negative.  Neurotropism absent.  Tumor  infiltrating lymphocytes absent.  Tumor regression not noted.  3 out of 3 positive sentinel lymph nodes for melanoma. - PET CT scan on 03/22/2021 with 3 foci of intense radiotracer activity within the right lower extremity.  In the medial right upper thigh nodule just superficial to thigh musculature measuring 11 mm with SUV 7.6.  Intramedullary hypermetabolic activity within the shaft of the mid right femur with SUV 14.6.  Intense activity associated with condylar notch of the distal right femur, appears to localize to soft tissue rather than the bone.  More diffuse activity associated with the medial right calf related to excision biopsy.  There is a focus of activity adjacent to the lateral margin of the right iliac wing SUV 4.1.  No evidence of visceral metastatic disease in the chest, abdomen or pelvis. 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 - NGS testing-BRAF negative, positive for NRAS pathogenic variant exon 3, TERT promoter.  MSI-stable.  MMR-proficient.  [place] 1/2/3 fusion not detected.  KIT mutation negative.  PD-L1 negative. - Nivolumab-Relatlimab every 28 days started on 05/09/2021.  Held permanently after first dose due to myocarditis. - PET scan on 07/25/2021: Slight progression with no new sites of metastatic disease. - Pembrolizumab started on 09/03/2021    Social/family history: - She is seen with her son today.  She lives by herself at home.  She is independent of ADLs and IADLs.  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  She is non-smoker. - Father had colon cancer.  Sister had breast cancer and colon cancer.  Maternal aunt had breast cancer.  Paternal uncle had colon cancer.   PLAN:  Malignant melanoma of the  right posterior leg (pT4 pN3 M1), BRAF V600 negative: - She has  completed 3 cycles of Keytruda.  Did not have any immunotherapy related side effects. - Reviewed PET scan (11/14/2021): Significant interval decrease in size of soft tissue nodules in the right thigh with no new lesions. - Mildly metabolic heterogeneous macroscopic fat-containing masslike area posterior to the rectum not significantly changed from November 2022, suspicious for low-grade liposarcoma or sacrococcygeal teratoma. - Will consider biopsy of the lesion and talk to her at next visit. - As she is not having any symptoms, we will restart her back on Keytruda next week.  I will see her back in 4 weeks for follow-up.  2.  History of immunotherapy myocarditis: - I have held her treatment on 11/05/2021 as her troponin was elevated at 145. - She does not have any EKG changes.  She does not report any chest pain or extremity pains. - Today troponin is 13.  We will resume immunotherapy next week.  3.  Right medial thigh pain: - She does not report any pain at this time.  4.  Hypothyroidism: - Continue Synthroid 100 mcg daily.  TSH is 3.9.   Orders placed this encounter:  No orders of the defined types were placed in this encounter.    Derek Jack, MD [place] [phone]   I, Thana Ates, am acting as a scribe for Dr. Derek Jack.  I, Derek Jack MD, have reviewed the above documentation for accuracy and completeness, and I agree with the above.

## 2021-11-19 NOTE — Addendum Note (Signed)
Addended by: Charlyne Petrin B on: 11/19/2021 03:10 PM   Modules accepted: Orders

## 2021-11-19 NOTE — Patient Instructions (Signed)
[place] at [facility] Discharge Instructions  You were seen and examined today by Dr. Delton Coombes.  Dr. Delton Coombes discussed your most recent lab work and CT scan which revealed and everything is looking improved.   Follow-up as scheduled in 4 weeks.    Thank you for choosing [place] at [facility] to provide your oncology and hematology care.  To afford each patient quality time with our provider, please arrive at least 15 minutes before your scheduled appointment time.   If you have a lab appointment with the [facility] please come in thru the Main Entrance and check in at the main information desk.  You need to re-schedule your appointment should you arrive 10 or more minutes late.  We strive to give you quality time with our providers, and arriving late affects you and other patients whose appointments are after yours.  Also, if you no show three or more times for appointments you may be dismissed from the clinic at the providers discretion.     Again, thank you for choosing [facility].  Our hope is that these requests will decrease the amount of time that you wait before being seen by our physicians.       _____________________________________________________________  Should you have questions after your visit to [facility], please contact our office at [phone] and follow the prompts.  Our office hours are 8:00 a.m. and 4:30 p.m. Monday - Friday.  Please note that voicemails left after 4:00 p.m. may not be returned until the following business day.  We are closed weekends and major holidays.  You do have access to a nurse 24-7, just call the main number to the clinic [phone] and do not press any options, hold on the line and a nurse will answer the phone.    For prescription refill requests, have your pharmacy contact our office and allow 72 hours.    Due to Covid, you will need to wear a mask  upon entering the hospital. If you do not have a mask, a mask will be given to you at the Main Entrance upon arrival. For doctor visits, patients may have 1 support person age 22 or older with them. For treatment visits, patients can not have anyone with them due to social distancing guidelines and our immunocompromised population.

## 2021-11-26 ENCOUNTER — Inpatient Hospital Stay (HOSPITAL_COMMUNITY): Payer: Medicare Other

## 2021-11-26 ENCOUNTER — Inpatient Hospital Stay (HOSPITAL_COMMUNITY): Payer: Medicare Other | Admitting: Hematology

## 2021-11-26 ENCOUNTER — Other Ambulatory Visit (HOSPITAL_COMMUNITY): Payer: Self-pay | Admitting: Hematology

## 2021-11-26 VITALS — BP 114/50 | HR 62 | Temp 98.1°F | Resp 18

## 2021-11-26 DIAGNOSIS — Z800 Family history of malignant neoplasm of digestive organs: Secondary | ICD-10-CM | POA: Diagnosis not present

## 2021-11-26 DIAGNOSIS — C4371 Malignant melanoma of right lower limb, including hip: Secondary | ICD-10-CM

## 2021-11-26 DIAGNOSIS — I700 Atherosclerosis of aorta: Secondary | ICD-10-CM | POA: Diagnosis not present

## 2021-11-26 DIAGNOSIS — Z298 Encounter for other specified prophylactic measures: Secondary | ICD-10-CM

## 2021-11-26 DIAGNOSIS — C439 Malignant melanoma of skin, unspecified: Secondary | ICD-10-CM

## 2021-11-26 DIAGNOSIS — I514 Myocarditis, unspecified: Secondary | ICD-10-CM

## 2021-11-26 DIAGNOSIS — I409 Acute myocarditis, unspecified: Secondary | ICD-10-CM

## 2021-11-26 DIAGNOSIS — E039 Hypothyroidism, unspecified: Secondary | ICD-10-CM | POA: Diagnosis not present

## 2021-11-26 DIAGNOSIS — I10 Essential (primary) hypertension: Secondary | ICD-10-CM | POA: Diagnosis not present

## 2021-11-26 DIAGNOSIS — Z95828 Presence of other vascular implants and grafts: Secondary | ICD-10-CM

## 2021-11-26 DIAGNOSIS — Z5112 Encounter for antineoplastic immunotherapy: Secondary | ICD-10-CM | POA: Diagnosis not present

## 2021-11-26 DIAGNOSIS — Z8679 Personal history of other diseases of the circulatory system: Secondary | ICD-10-CM | POA: Diagnosis not present

## 2021-11-26 DIAGNOSIS — Z79899 Other long term (current) drug therapy: Secondary | ICD-10-CM | POA: Diagnosis not present

## 2021-11-26 DIAGNOSIS — C7951 Secondary malignant neoplasm of bone: Secondary | ICD-10-CM | POA: Diagnosis not present

## 2021-11-26 DIAGNOSIS — Z803 Family history of malignant neoplasm of breast: Secondary | ICD-10-CM | POA: Diagnosis not present

## 2021-11-26 LAB — CBC WITH DIFFERENTIAL/PLATELET
Abs Immature Granulocytes: 0.01 10*3/uL (ref 0.00–0.07)
Basophils Absolute: 0 10*3/uL (ref 0.0–0.1)
Basophils Relative: 0 %
Eosinophils Absolute: 0.1 10*3/uL (ref 0.0–0.5)
Eosinophils Relative: 3 %
HCT: 39.1 % (ref 36.0–46.0)
Hemoglobin: 13 g/dL (ref 12.0–15.0)
Immature Granulocytes: 0 %
Lymphocytes Relative: 23 %
Lymphs Abs: 1.3 10*3/uL (ref 0.7–4.0)
MCH: 30.3 pg (ref 26.0–34.0)
MCHC: 33.2 g/dL (ref 30.0–36.0)
MCV: 91.1 fL (ref 80.0–100.0)
Monocytes Absolute: 0.5 10*3/uL (ref 0.1–1.0)
Monocytes Relative: 9 %
Neutro Abs: 3.6 10*3/uL (ref 1.7–7.7)
Neutrophils Relative %: 65 %
Platelets: 177 10*3/uL (ref 150–400)
RBC: 4.29 MIL/uL (ref 3.87–5.11)
RDW: 12.4 % (ref 11.5–15.5)
WBC: 5.6 10*3/uL (ref 4.0–10.5)
nRBC: 0 % (ref 0.0–0.2)

## 2021-11-26 LAB — COMPREHENSIVE METABOLIC PANEL
ALT: 66 U/L — ABNORMAL HIGH (ref 0–44)
AST: 93 U/L — ABNORMAL HIGH (ref 15–41)
Albumin: 3.6 g/dL (ref 3.5–5.0)
Alkaline Phosphatase: 137 U/L — ABNORMAL HIGH (ref 38–126)
Anion gap: 7 (ref 5–15)
BUN: 11 mg/dL (ref 8–23)
CO2: 28 mmol/L (ref 22–32)
Calcium: 8.7 mg/dL — ABNORMAL LOW (ref 8.9–10.3)
Chloride: 98 mmol/L (ref 98–111)
Creatinine, Ser: 0.69 mg/dL (ref 0.44–1.00)
GFR, Estimated: >60 mL/min (ref >60)
Glucose, Bld: 101 mg/dL — ABNORMAL HIGH (ref 70–99)
Potassium: 3.9 mmol/L (ref 3.5–5.1)
Sodium: 133 mmol/L — ABNORMAL LOW (ref 135–145)
Total Bilirubin: 0.9 mg/dL (ref 0.3–1.2)
Total Protein: 6.4 g/dL — ABNORMAL LOW (ref 6.5–8.1)

## 2021-11-26 LAB — TSH: TSH: 9.789 u[IU]/mL — ABNORMAL HIGH (ref 0.350–4.500)

## 2021-11-26 LAB — MAGNESIUM: Magnesium: 1.9 mg/dL (ref 1.7–2.4)

## 2021-11-26 LAB — BRAIN NATRIURETIC PEPTIDE: B Natriuretic Peptide: 178 pg/mL — ABNORMAL HIGH (ref 0.0–100.0)

## 2021-11-26 LAB — TROPONIN I (HIGH SENSITIVITY): Troponin I (High Sensitivity): 13 ng/L (ref <18)

## 2021-11-26 MED ORDER — SODIUM CHLORIDE 0.9% FLUSH
10.0000 mL | INTRAVENOUS | Status: DC | PRN
  Administered 2021-11-26: 10 mL

## 2021-11-26 MED ORDER — SODIUM CHLORIDE 0.9 % IV SOLN
200.0000 mg | Freq: Once | INTRAVENOUS | Status: AC
  Administered 2021-11-26: 200 mg via INTRAVENOUS
  Filled 2021-11-26: qty 8

## 2021-11-26 MED ORDER — SODIUM CHLORIDE 0.9 % IV SOLN: Freq: Once | INTRAVENOUS | Status: AC

## 2021-11-26 MED ORDER — HEPARIN SOD (PORK) LOCK FLUSH 100 UNIT/ML IV SOLN
500.0000 [IU] | Freq: Once | INTRAVENOUS | Status: AC | PRN
  Administered 2021-11-26: 500 [IU]

## 2021-11-26 NOTE — Progress Notes (Signed)
No need to do EKG today per MD.

## 2021-11-26 NOTE — Patient Instructions (Signed)
[facility]  Discharge Instructions: Thank you for choosing [facility] to provide your oncology and hematology care.  If you have a lab appointment with the Cancer Center, please come in thru the Main Entrance and check in at the main information desk.  Wear comfortable clothing and clothing appropriate for easy access to any Portacath or PICC line.   We strive to give you quality time with your provider. You may need to reschedule your appointment if you arrive late (15 or more minutes).  Arriving late affects you and other patients whose appointments are after yours.  Also, if you miss three or more appointments without notifying the office, you may be dismissed from the clinic at the provider's discretion.      For prescription refill requests, have your pharmacy contact our office and allow 72 hours for refills to be completed.    Today you received the following chemotherapy and/or immunotherapy agents Keytruda   To help prevent nausea and vomiting after your treatment, we encourage you to take your nausea medication as directed.  BELOW ARE SYMPTOMS THAT SHOULD BE REPORTED IMMEDIATELY: *FEVER GREATER THAN 100.4 F (38 C) OR HIGHER *CHILLS OR SWEATING *NAUSEA AND VOMITING THAT IS NOT CONTROLLED WITH YOUR NAUSEA MEDICATION *UNUSUAL SHORTNESS OF BREATH *UNUSUAL BRUISING OR BLEEDING *URINARY PROBLEMS (pain or burning when urinating, or frequent urination) *BOWEL PROBLEMS (unusual diarrhea, constipation, pain near the anus) TENDERNESS IN MOUTH AND THROAT WITH OR WITHOUT PRESENCE OF ULCERS (sore throat, sores in mouth, or a toothache) UNUSUAL RASH, SWELLING OR PAIN  UNUSUAL VAGINAL DISCHARGE OR ITCHING   Items with * indicate a potential emergency and should be followed up as soon as possible or go to the Emergency Department if any problems should occur.  Please show the CHEMOTHERAPY ALERT CARD or IMMUNOTHERAPY ALERT CARD at check-in to the Emergency Department  and triage nurse.  Should you have questions after your visit or need to cancel or reschedule your appointment, please contact [facility] [phone]  and follow the prompts.  Office hours are 8:00 a.m. to 4:30 p.m. Monday - Friday. Please note that voicemails left after 4:00 p.m. may not be returned until the following business day.  We are closed weekends and major holidays. You have access to a nurse at all times for urgent questions. Please call the main number to the clinic [phone] and follow the prompts.  For any non-urgent questions, you may also contact your provider using MyChart. We now offer e-Visits for anyone 18 and older to request care online for non-urgent symptoms. For details visit [email or web address].   Also download the MyChart app! Go to the app store, search "MyChart", open the app, select , and log in with your MyChart username and password.  Masks are optional in the cancer centers. If you would like for your care team to wear a mask while they are taking care of you, please let them know. For doctor visits, patients may have with them one support person who is at least 81 years old. At this time, visitors are not allowed in the infusion area. Pembrolizumab injection What is this medication? PEMBROLIZUMAB (pem broe liz ue mab) is a monoclonal antibody. It is used to treat certain types of cancer. This medicine may be used for other purposes; ask your health care provider or pharmacist if you have questions. COMMON BRAND NAME(S): Keytruda What should I tell my care team before I take this medication? They need to   know if you have any of these conditions: autoimmune diseases like Crohn's disease, ulcerative colitis, or lupus have had or planning to have an allogeneic stem cell transplant (uses someone else's stem cells) history of organ transplant history of chest radiation nervous system problems like myasthenia gravis or Guillain-Barre  syndrome an unusual or allergic reaction to pembrolizumab, other medicines, foods, dyes, or preservatives pregnant or trying to get pregnant breast-feeding How should I use this medication? This medicine is for infusion into a vein. It is given by a health care professional in a hospital or clinic setting. A special MedGuide will be given to you before each treatment. Be sure to read this information carefully each time. Talk to your pediatrician regarding the use of this medicine in children. While this drug may be prescribed for children as young as 6 months for selected conditions, precautions do apply. Overdosage: If you think you have taken too much of this medicine contact a poison control center or emergency room at once. NOTE: This medicine is only for you. Do not share this medicine with others. What if I miss a dose? It is important not to miss your dose. Call your doctor or health care professional if you are unable to keep an appointment. What may interact with this medication? Interactions have not been studied. This list may not describe all possible interactions. Give your health care provider a list of all the medicines, herbs, non-prescription drugs, or dietary supplements you use. Also tell them if you smoke, drink alcohol, or use illegal drugs. Some items may interact with your medicine. What should I watch for while using this medication? Your condition will be monitored carefully while you are receiving this medicine. You may need blood work done while you are taking this medicine. Do not become pregnant while taking this medicine or for 4 months after stopping it. Women should inform their doctor if they wish to become pregnant or think they might be pregnant. There is a potential for serious side effects to an unborn child. Talk to your health care professional or pharmacist for more information. Do not breast-feed an infant while taking this medicine or for 4 months after  the last dose. What side effects may I notice from receiving this medication? Side effects that you should report to your doctor or health care professional as soon as possible: allergic reactions like skin rash, itching or hives, swelling of the face, lips, or tongue bloody or black, tarry breathing problems changes in vision chest pain chills confusion constipation cough diarrhea dizziness or feeling faint or lightheaded fast or irregular heartbeat fever flushing joint pain low blood counts - this medicine may decrease the number of white blood cells, red blood cells and platelets. You may be at increased risk for infections and bleeding. muscle pain muscle weakness pain, tingling, numbness in the hands or feet persistent headache redness, blistering, peeling or loosening of the skin, including inside the mouth signs and symptoms of high blood sugar such as dizziness; dry mouth; dry skin; fruity breath; nausea; stomach pain; increased hunger or thirst; increased urination signs and symptoms of kidney injury like trouble passing urine or change in the amount of urine signs and symptoms of liver injury like dark urine, light-colored stools, loss of appetite, nausea, right upper belly pain, yellowing of the eyes or skin sweating swollen lymph nodes weight loss Side effects that usually do not require medical attention (report to your doctor or health care professional if they continue or are   bothersome): decreased appetite hair loss tiredness This list may not describe all possible side effects. Call your doctor for medical advice about side effects. You may report side effects to FDA at 1-800-FDA-1088. Where should I keep my medication? This drug is given in a hospital or clinic and will not be stored at home. NOTE: This sheet is a summary. It may not cover all possible information. If you have questions about this medicine, talk to your doctor, pharmacist, or health care  provider.  2023 Elsevier/Gold Standard (2021-03-29 00:00:00)  

## 2021-11-26 NOTE — Progress Notes (Signed)
Chaplain engaged in a follow-up visit with Elizabeth Norman who shared the blessing of having a clear pet scan. Chaplain celebrated with her.  Elizabeth Norman also spent time talking about her life recently and about her community and neighborhood.    Chaplain offered reflective listening, affirmation, and support.     11/26/21 1100  Clinical Encounter Type  Visited With Patient  Visit Type Follow-up;Spiritual support

## 2021-11-26 NOTE — Progress Notes (Signed)
Pt presents today for Keytruda per provider's order. Vital sings and labs WNL for treatment. Okay to proceed with treatment today.  Keytruda given today per MD orders. Tolerated infusion without adverse affects. Vital signs stable. No complaints at this time. Discharged from clinic ambulatory in stable condition. Alert and oriented x 3. F/U with [facility] as scheduled.

## 2021-12-02 ENCOUNTER — Other Ambulatory Visit: Payer: Self-pay

## 2021-12-02 DIAGNOSIS — Z87828 Personal history of other (healed) physical injury and trauma: Secondary | ICD-10-CM | POA: Diagnosis not present

## 2021-12-02 DIAGNOSIS — H26493 Other secondary cataract, bilateral: Secondary | ICD-10-CM | POA: Diagnosis not present

## 2021-12-02 DIAGNOSIS — Z947 Corneal transplant status: Secondary | ICD-10-CM | POA: Diagnosis not present

## 2021-12-02 DIAGNOSIS — Z961 Presence of intraocular lens: Secondary | ICD-10-CM | POA: Diagnosis not present

## 2021-12-05 ENCOUNTER — Other Ambulatory Visit: Payer: Self-pay

## 2021-12-09 ENCOUNTER — Other Ambulatory Visit: Payer: Self-pay

## 2021-12-17 ENCOUNTER — Inpatient Hospital Stay (HOSPITAL_BASED_OUTPATIENT_CLINIC_OR_DEPARTMENT_OTHER): Payer: Medicare Other | Admitting: Hematology

## 2021-12-17 ENCOUNTER — Inpatient Hospital Stay: Payer: Medicare Other | Attending: Hematology

## 2021-12-17 ENCOUNTER — Inpatient Hospital Stay: Payer: Medicare Other

## 2021-12-17 ENCOUNTER — Other Ambulatory Visit: Payer: Self-pay | Admitting: *Deleted

## 2021-12-17 DIAGNOSIS — C439 Malignant melanoma of skin, unspecified: Secondary | ICD-10-CM | POA: Diagnosis not present

## 2021-12-17 DIAGNOSIS — I514 Myocarditis, unspecified: Secondary | ICD-10-CM | POA: Diagnosis not present

## 2021-12-17 DIAGNOSIS — C4371 Malignant melanoma of right lower limb, including hip: Secondary | ICD-10-CM | POA: Insufficient documentation

## 2021-12-17 DIAGNOSIS — Z79899 Other long term (current) drug therapy: Secondary | ICD-10-CM | POA: Diagnosis not present

## 2021-12-17 DIAGNOSIS — E039 Hypothyroidism, unspecified: Secondary | ICD-10-CM | POA: Diagnosis not present

## 2021-12-17 DIAGNOSIS — Z5112 Encounter for antineoplastic immunotherapy: Secondary | ICD-10-CM | POA: Diagnosis not present

## 2021-12-17 DIAGNOSIS — I10 Essential (primary) hypertension: Secondary | ICD-10-CM | POA: Insufficient documentation

## 2021-12-17 DIAGNOSIS — C7951 Secondary malignant neoplasm of bone: Secondary | ICD-10-CM | POA: Diagnosis not present

## 2021-12-17 DIAGNOSIS — Z95828 Presence of other vascular implants and grafts: Secondary | ICD-10-CM

## 2021-12-17 LAB — CBC WITH DIFFERENTIAL/PLATELET
Abs Immature Granulocytes: 0.02 10*3/uL (ref 0.00–0.07)
Basophils Absolute: 0.1 10*3/uL (ref 0.0–0.1)
Basophils Relative: 1 %
Eosinophils Absolute: 0.1 10*3/uL (ref 0.0–0.5)
Eosinophils Relative: 1 %
HCT: 38.9 % (ref 36.0–46.0)
Hemoglobin: 12.8 g/dL (ref 12.0–15.0)
Immature Granulocytes: 0 %
Lymphocytes Relative: 22 %
Lymphs Abs: 1.3 10*3/uL (ref 0.7–4.0)
MCH: 30 pg (ref 26.0–34.0)
MCHC: 32.9 g/dL (ref 30.0–36.0)
MCV: 91.3 fL (ref 80.0–100.0)
Monocytes Absolute: 0.5 10*3/uL (ref 0.1–1.0)
Monocytes Relative: 8 %
Neutro Abs: 3.9 10*3/uL (ref 1.7–7.7)
Neutrophils Relative %: 68 %
Platelets: 160 10*3/uL (ref 150–400)
RBC: 4.26 MIL/uL (ref 3.87–5.11)
RDW: 13 % (ref 11.5–15.5)
WBC: 5.8 10*3/uL (ref 4.0–10.5)
nRBC: 0 % (ref 0.0–0.2)

## 2021-12-17 LAB — COMPREHENSIVE METABOLIC PANEL
ALT: 83 U/L — ABNORMAL HIGH (ref 0–44)
AST: 91 U/L — ABNORMAL HIGH (ref 15–41)
Albumin: 3.7 g/dL (ref 3.5–5.0)
Alkaline Phosphatase: 207 U/L — ABNORMAL HIGH (ref 38–126)
Anion gap: 4 — ABNORMAL LOW (ref 5–15)
BUN: 12 mg/dL (ref 8–23)
CO2: 29 mmol/L (ref 22–32)
Calcium: 9 mg/dL (ref 8.9–10.3)
Chloride: 102 mmol/L (ref 98–111)
Creatinine, Ser: 0.72 mg/dL (ref 0.44–1.00)
GFR, Estimated: >60 mL/min (ref >60)
Glucose, Bld: 108 mg/dL — ABNORMAL HIGH (ref 70–99)
Potassium: 4 mmol/L (ref 3.5–5.1)
Sodium: 135 mmol/L (ref 135–145)
Total Bilirubin: 1.1 mg/dL (ref 0.3–1.2)
Total Protein: 6.8 g/dL (ref 6.5–8.1)

## 2021-12-17 LAB — TSH: TSH: 6.918 u[IU]/mL — ABNORMAL HIGH (ref 0.350–4.500)

## 2021-12-17 LAB — TROPONIN I (HIGH SENSITIVITY): Troponin I (High Sensitivity): 11 ng/L (ref <18)

## 2021-12-17 MED ORDER — NYSTATIN 100000 UNIT/ML MT SUSP: 10.0000 mL | Freq: Four times a day (QID) | OROMUCOSAL | 1 refills | Status: DC

## 2021-12-17 MED ORDER — HEPARIN SOD (PORK) LOCK FLUSH 100 UNIT/ML IV SOLN
500.0000 [IU] | Freq: Once | INTRAVENOUS | Status: AC | PRN
  Administered 2021-12-17: 500 [IU]

## 2021-12-17 MED ORDER — SODIUM CHLORIDE 0.9 % IV SOLN: Freq: Once | INTRAVENOUS | Status: AC

## 2021-12-17 MED ORDER — SODIUM CHLORIDE 0.9% FLUSH
10.0000 mL | INTRAVENOUS | Status: DC | PRN
  Administered 2021-12-17: 10 mL

## 2021-12-17 MED ORDER — SODIUM CHLORIDE 0.9 % IV SOLN
200.0000 mg | Freq: Once | INTRAVENOUS | Status: AC
  Administered 2021-12-17: 200 mg via INTRAVENOUS
  Filled 2021-12-17: qty 8

## 2021-12-17 NOTE — Patient Instructions (Signed)
[facility]  Discharge Instructions: Thank you for choosing [facility] to provide your oncology and hematology care.  If you have a lab appointment with the Cancer Center, please come in thru the Main Entrance and check in at the main information desk.  Wear comfortable clothing and clothing appropriate for easy access to any Portacath or PICC line.   We strive to give you quality time with your provider. You may need to reschedule your appointment if you arrive late (15 or more minutes).  Arriving late affects you and other patients whose appointments are after yours.  Also, if you miss three or more appointments without notifying the office, you may be dismissed from the clinic at the provider's discretion.      For prescription refill requests, have your pharmacy contact our office and allow 72 hours for refills to be completed.    Today you received the following chemotherapy and/or immunotherapy agents Keytruda      To help prevent nausea and vomiting after your treatment, we encourage you to take your nausea medication as directed.  BELOW ARE SYMPTOMS THAT SHOULD BE REPORTED IMMEDIATELY: *FEVER GREATER THAN 100.4 F (38 C) OR HIGHER *CHILLS OR SWEATING *NAUSEA AND VOMITING THAT IS NOT CONTROLLED WITH YOUR NAUSEA MEDICATION *UNUSUAL SHORTNESS OF BREATH *UNUSUAL BRUISING OR BLEEDING *URINARY PROBLEMS (pain or burning when urinating, or frequent urination) *BOWEL PROBLEMS (unusual diarrhea, constipation, pain near the anus) TENDERNESS IN MOUTH AND THROAT WITH OR WITHOUT PRESENCE OF ULCERS (sore throat, sores in mouth, or a toothache) UNUSUAL RASH, SWELLING OR PAIN  UNUSUAL VAGINAL DISCHARGE OR ITCHING   Items with * indicate a potential emergency and should be followed up as soon as possible or go to the Emergency Department if any problems should occur.  Please show the CHEMOTHERAPY ALERT CARD or IMMUNOTHERAPY ALERT CARD at check-in to the  Emergency Department and triage nurse.  Should you have questions after your visit or need to cancel or reschedule your appointment, please contact [facility] [phone]  and follow the prompts.  Office hours are 8:00 a.m. to 4:30 p.m. Monday - Friday. Please note that voicemails left after 4:00 p.m. may not be returned until the following business day.  We are closed weekends and major holidays. You have access to a nurse at all times for urgent questions. Please call the main number to the clinic [phone] and follow the prompts.  For any non-urgent questions, you may also contact your provider using MyChart. We now offer e-Visits for anyone 18 and older to request care online for non-urgent symptoms. For details visit [email or web address].   Also download the MyChart app! Go to the app store, search "MyChart", open the app, select [place], and log in with your MyChart username and password.  Masks are optional in the cancer centers. If you would like for your care team to wear a mask while they are taking care of you, please let them know. For doctor visits, patients may have with them one support person who is at least 81 years old. At this time, visitors are not allowed in the infusion area.  

## 2021-12-17 NOTE — Progress Notes (Signed)
Elizabeth Norman, Elizabeth Norman [id number]   CLINIC:  Medical Oncology/Hematology  PCP:  Elizabeth Burly, MD [place] / [place] [address] [phone]   REASON FOR VISIT:  Follow-up for metastatic malignant melanoma, BRAF V600 negative  PRIOR THERAPY: none  NGS Results: BRAF negative, positive for NRAS pathogenic variant exon 3, TERT promoter.  MSI-stable.  MMR-proficient.  [place] 1/2/3 fusion not detected.  KIT mutation negative.  PD-L1 negative.  CURRENT THERAPY: Pembrolizumab (200) q21d Keytruda  BRIEF ONCOLOGIC HISTORY:  Oncology History  Malignant melanoma of lower leg, right ([place])  03/27/2021 Initial Diagnosis   Malignant melanoma of lower leg, right ([place])   05/09/2021 - 05/09/2021 Chemotherapy   Patient is on Treatment Plan : MELANOMA - [place] (nivolumab/relatlimab) q28d     09/03/2021 -  Chemotherapy   Patient is on Treatment Plan : MELANOMA Pembrolizumab (200) q21d       CANCER STAGING:  Cancer Staging  Malignant melanoma of lower leg, right ([place]) Staging form: Melanoma of the Skin, AJCC 8th Edition - Clinical stage from 03/27/2021: Stage IV (cT4a, cN3, cM1) - Unsigned   INTERVAL HISTORY:  Ms. ALAJA Norman, a 81 y.o. female, returns to clinic for next cycle of Keytruda.  Last treatment was on 11/26/2021.  After last treatment she did not have any chest pains or upper extremity pains.  She is taking MiraLAX as needed for constipation.  Does not have any dry cough or shortness of breath.  Appetite has been good.  Reports burning sensation on the tongue when she eats acidic foods.  REVIEW OF SYSTEMS:  Review of Systems  Constitutional:  Negative for appetite change and fatigue.  Psychiatric/Behavioral:  Negative for sleep disturbance.   All other systems reviewed and are negative.   PAST MEDICAL/SURGICAL HISTORY:  Past Medical History:  Diagnosis Date   Arthritis    Hypertension    Malignant melanoma of lower leg, right  ([place]) 03/27/2021   Port-A-Cath in place 04/30/2021   Past Surgical History:  Procedure Laterality Date   CATARACT EXTRACTION W/PHACO Right 10/03/2014   Procedure: CATARACT EXTRACTION PHACO AND INTRAOCULAR LENS PLACEMENT ([place]);  Surgeon: [name], MD;  Location: [facility];  Service: Ophthalmology;  Laterality: Right;  CDE:10.09   CATARACT EXTRACTION W/PHACO Left 10/10/2014   Procedure: CATARACT EXTRACTION PHACO AND INTRAOCULAR LENS PLACEMENT ([facility]);  Surgeon: [name], MD;  Location: [facility];  Service: Ophthalmology;  Laterality: Left;  CDE:8.69   CORNEAL 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   CORNEAL TRANSPLANT Left 2020   DENTAL RESTORATION/EXTRACTION WITH X-RAY     IR IMAGING GUIDED PORT INSERTION  04/05/2021   LEFT HEART CATH AND CORONARY ANGIOGRAPHY N/A 05/28/2021   Procedure: LEFT HEART CATH AND CORONARY ANGIOGRAPHY;  Surgeon: Nigel Mormon, MD;  Location: [facility];  Service: Cardiovascular;  Laterality: N/A;   MELANOMA EXCISION Right 02/12/2021   Procedure: WIDE LOCAL EXCISION RIGHT LOWER LEG MELANOMA , ADVANCEMENT FLAP CLOSURE DEFECT;  Surgeon: [name], MD;  Location: [facility];  Service: General;  Laterality: Right;   SENTINEL NODE BIOPSY Right 02/12/2021   Procedure: SENTINEL NODE BIOPSY;  Surgeon: [name], MD;  Location: [facility];  Service: General;  Laterality: Right;    SOCIAL HISTORY:  Social History   Socioeconomic History   Marital status: Widowed    Spouse name: Not on file   Number of children: 3   Years of education: Not on file   Highest education level:  Not on file  Occupational History   Not on file  Tobacco Use   Smoking status: Never   Smokeless tobacco: Never  Vaping Use   Vaping Use: Never used  Substance and Sexual Activity   Alcohol use: No   Drug use: No   Sexual activity: Not on file  Other Topics Concern   Not on file  Social History Narrative   Not on file   Social Determinants of Health   Financial Resource Strain: Not on file   Food Insecurity: Not on file  Transportation Needs: Not on file  Physical Activity: Not on file  Stress: Not on file  Social Connections: Not on file  Intimate Partner Violence: Not on file    FAMILY HISTORY:  Family History  Problem Relation Age of Onset   Cancer Father    Hypertension Sister    Hypertension Sister     CURRENT MEDICATIONS:  Current Outpatient Medications  Medication Sig Dispense Refill   alendronate (FOSAMAX) 70 MG tablet Take 70 mg by mouth once a week.  0   ALPRAZolam (XANAX) 0.5 MG tablet Take 0.5 mg by mouth 3 (three) times daily.  2   aspirin EC 81 MG EC tablet Take 1 tablet (81 mg total) by mouth daily. Swallow whole. 30 tablet 11   atorvastatin (LIPITOR) 40 MG tablet Take 1 tablet (40 mg total) by mouth daily. 30 tablet 0   calcium carbonate (OS-CAL - DOSED IN MG OF ELEMENTAL CALCIUM) 1250 (500 CA) MG tablet Take 1 tablet by mouth 3 (three) times a week.     cholecalciferol (VITAMIN D) 1000 UNITS tablet Take 1,000 Units by mouth 3 (three) times a week.     Cyanocobalamin (VITAMIN B-12 PO) Take 1 tablet by mouth 3 (three) times a week.     levothyroxine (SYNTHROID) 100 MCG tablet Take 1 tablet by mouth daily before breakfast. 30 tablet 3   metoprolol tartrate (LOPRESSOR) 25 MG tablet Take 1 tablet (25 mg total) by mouth 2 (two) times daily. 60 tablet 0   prednisoLONE acetate (PRED FORTE) 1 % ophthalmic suspension Place 1 drop into both eyes See admin instructions. Instill one drop into the right eye twice daily and one drop into the left eye once daily.     No current facility-administered medications for this visit.    ALLERGIES:  Allergies  Allergen Reactions   Tape     Pulls skin, please use paper tape    PHYSICAL EXAM:  Performance status (ECOG): 0 - Asymptomatic  There were no vitals filed for this visit.  Wt Readings from Last 3 Encounters:  12/17/21 174 lb 6.1 oz (79.1 kg)  11/26/21 177 lb (80.3 kg)  11/19/21 177 lb 8 oz (80.5 kg)    Physical Exam Vitals reviewed.  Constitutional:      Appearance: Normal appearance.  Cardiovascular:     Rate and Rhythm: Normal rate and regular rhythm.     Pulses: Normal pulses.     Heart sounds: Normal heart sounds.  Pulmonary:     Breath sounds: Normal breath sounds.  Neurological:     General: No focal deficit present.     Mental Status: She is alert and oriented to person, place, and time.  Psychiatric:        Mood and Affect: Mood normal.        Behavior: Behavior normal.      LABORATORY DATA:  I have reviewed the labs as listed.     Latest  Ref Rng & Units 11/26/2021    7:46 AM 11/19/2021   12:45 PM 11/05/2021    8:56 AM  CBC  WBC 4.0 - 10.5 K/uL 5.6  5.1  5.1   Hemoglobin 12.0 - 15.0 g/dL 13.0  12.8  12.8   Hematocrit 36.0 - 46.0 % 39.1  39.9  39.3   Platelets 150 - 400 K/uL 177  156  149       Latest Ref Rng & Units 11/26/2021    7:46 AM 11/19/2021   12:45 PM 11/05/2021    8:56 AM  CMP  Glucose 70 - 99 mg/dL 101  133  110   BUN 8 - 23 mg/dL 11  13  9    Creatinine 0.44 - 1.00 mg/dL 0.69  0.73  0.73   Sodium 135 - 145 mmol/L 133  136  136   Potassium 3.5 - 5.1 mmol/L 3.9  4.7  4.0   Chloride 98 - 111 mmol/L 98  102  102   CO2 22 - 32 mmol/L 28  28  28    Calcium 8.9 - 10.3 mg/dL 8.7  9.1  8.7   Total Protein 6.5 - 8.1 g/dL 6.4  6.5  6.3   Total Bilirubin 0.3 - 1.2 mg/dL 0.9  1.0  0.9   Alkaline Phos 38 - 126 U/L 137  137  76   AST 15 - 41 U/L 93  41  30   ALT 0 - 44 U/L 66  44  19     DIAGNOSTIC IMAGING:  I have independently reviewed the scans and discussed with the patient. No results found.   ASSESSMENT:  Malignant melanoma of right posterior leg (pT4 pN3 M1): - She noticed fleshy lesion 4 months ago on the right leg. - Her PMD did a biopsy which was consistent with melanoma. - Wide local excision and lymph node biopsy by Dr. Barry Dienes on 02/12/2021. - Pathology margins free, nodular type, Breslow's thickness 9.52 mm, Clark level V, deep margins free,  ulceration absent, satellitosis absent, mitotic index 2/millimeters squared, LVI negative.  Neurotropism absent.  Tumor infiltrating lymphocytes absent.  Tumor regression not noted.  3 out of 3 positive sentinel lymph nodes for melanoma. - PET CT scan on 03/22/2021 with 3 foci of intense radiotracer activity within the right lower extremity.  In the medial right upper thigh nodule just superficial to thigh musculature measuring 11 mm with SUV 7.6.  Intramedullary hypermetabolic activity within the shaft of the mid right femur with SUV 14.6.  Intense activity associated with condylar notch of the distal right femur, appears to localize to soft tissue rather than the bone.  More diffuse activity associated with the medial right calf related to excision biopsy.  There is a focus of activity adjacent to the lateral margin of the right iliac wing SUV 4.1.  No evidence of visceral metastatic disease in the chest, abdomen or pelvis. 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 - NGS testing-BRAF negative, positive for NRAS pathogenic variant exon 3, TERT promoter.  MSI-stable.  MMR-proficient.  [place] 1/2/3 fusion not detected.  KIT mutation negative.  PD-L1 negative. - Nivolumab-Relatlimab every 28 days started on 05/09/2021.  Held permanently after first dose due to myocarditis. - PET scan on 07/25/2021: Slight progression with no new sites of metastatic disease. - Pembrolizumab started on 09/03/2021  Social/family history: - She is seen with her son today.  She lives by herself at home.  She is independent of ADLs and IADLs.  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  She is non-smoker. - Father had colon cancer.  Sister had breast cancer and colon cancer.  Maternal aunt had breast cancer.  Paternal uncle had  colon cancer.   PLAN:  Malignant melanoma of the right posterior leg (pT4 pN3 M1), BRAF V600 negative: - PET scan after 3 cycles on 11/14/2021: Significant interval decrease in size of the soft tissue nodules in the right thigh with no new lesions. 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  We will follow-up on it on the next scan. - Reviewed labs today which showed mildly elevated AST and ALT and alk phos.  Creatinine was normal.  CBC was normal.  She will proceed with Keytruda today.  RTC 3 weeks for follow-up.  2.  History of immunotherapy myocarditis: - She does not have any symptoms of chest pain.  Troponin today is 11.  We will start her back on Keytruda.  3.  Right medial thigh pain: - She does not have any pain at this time.  4.  Hypothyroidism: - TSH 3 weeks ago has increased to 9.7 from 3.9.  She is taking Synthroid 100 mcg daily.  We have a level pending from today.  Based on that, will suggest any dose changes.   Orders placed this encounter:  No orders of the defined types were placed in this encounter.    Derek Jack, MD [place] [phone]

## 2021-12-17 NOTE — Patient Instructions (Signed)
[place] at [facility] Discharge Instructions   You were seen and examined today by Dr. Delton Coombes.  He reviewed the results of your lab work which are normal/stable.   We will proceed with your treatment today.   We sent a prescription for Nystatin suspension to your pharmacy. Use a directed.   Return as scheduled.    Thank you for choosing [place] at [facility] to provide your oncology and hematology care.  To afford each patient quality time with our provider, please arrive at least 15 minutes before your scheduled appointment time.   If you have a lab appointment with the [facility] please come in thru the Main Entrance and check in at the main information desk.  You need to re-schedule your appointment should you arrive 10 or more minutes late.  We strive to give you quality time with our providers, and arriving late affects you and other patients whose appointments are after yours.  Also, if you no show three or more times for appointments you may be dismissed from the clinic at the providers discretion.     Again, thank you for choosing [facility].  Our hope is that these requests will decrease the amount of time that you wait before being seen by our physicians.       _____________________________________________________________  Should you have questions after your visit to [facility], please contact our office at [phone] and follow the prompts.  Our office hours are 8:00 a.m. and 4:30 p.m. Monday - Friday.  Please note that voicemails left after 4:00 p.m. may not be returned until the following business day.  We are closed weekends and major holidays.  You do have access to a nurse 24-7, just call the main number to the clinic [phone] and do not press any options, hold on the line and a nurse will answer the phone.    For prescription refill requests, have your pharmacy contact our  office and allow 72 hours.    Due to Covid, you will need to wear a mask upon entering the hospital. If you do not have a mask, a mask will be given to you at the Main Entrance upon arrival. For doctor visits, patients may have 1 support person age 84 or older with them. For treatment visits, patients can not have anyone with them due to social distancing guidelines and our immunocompromised population.

## 2021-12-17 NOTE — Progress Notes (Signed)
Labs reviewed today, ok to treat per MD.   Treatment given per orders. Patient tolerated it well without problems. Vitals stable and discharged home from clinic ambulatory. Follow up as scheduled.

## 2021-12-18 ENCOUNTER — Encounter: Payer: Self-pay | Admitting: Cardiology

## 2021-12-18 ENCOUNTER — Ambulatory Visit: Payer: Medicare Other | Admitting: Cardiology

## 2021-12-18 VITALS — BP 143/71 | HR 73 | Temp 97.7°F | Resp 17 | Ht 62.0 in | Wt 174.0 lb

## 2021-12-18 DIAGNOSIS — I514 Myocarditis, unspecified: Secondary | ICD-10-CM | POA: Insufficient documentation

## 2021-12-18 DIAGNOSIS — I10 Essential (primary) hypertension: Secondary | ICD-10-CM

## 2021-12-18 DIAGNOSIS — T50905A Adverse effect of unspecified drugs, medicaments and biological substances, initial encounter: Secondary | ICD-10-CM | POA: Diagnosis not present

## 2021-12-18 NOTE — Progress Notes (Signed)
Follow up visit  Subjective:   Elizabeth Norman, female    DOB: Jun 24, 1940, 81 y.o.   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 Coronary angiography did not show any significant CAD. Cardiac MRI showed focal myocarditis. Her immunotherapy was thus kept on hold. She is doing well, denies chest pain, shortness of breath, palpitations, leg edema, orthopnea, PND, TIA/syncope.  While BNP has remained mildly elevated since 05/2021, most recent troponin (12/2021) was normal at 11. Patient has been started back on Keytruda and is doing well.   Reviewed recent test results with the patient, details below.     Current Outpatient Medications:    alendronate (FOSAMAX) 70 MG tablet, Take 70 mg by mouth once a week., Disp: , Rfl: 0   ALPRAZolam (XANAX) 0.5 MG tablet, Take 0.5 mg by mouth 3 (three) times daily., Disp: , Rfl: 2   aspirin EC 81 MG EC tablet, Take 1 tablet (81 mg total) by mouth daily. Swallow whole., Disp: 30 tablet, Rfl: 11   atorvastatin (LIPITOR) 40 MG tablet, Take 1 tablet (40 mg total) by mouth daily., Disp: 30 tablet, Rfl: 0   calcium carbonate (OS-CAL - DOSED IN MG OF ELEMENTAL CALCIUM) 1250 (500 CA) MG tablet, Take 1 tablet by mouth 3 (three) times a week., Disp: , Rfl:    cholecalciferol (VITAMIN D) 1000 UNITS tablet, Take 1,000 Units by mouth 3 (three) times a week., Disp: , Rfl:    Cyanocobalamin (VITAMIN B-12 PO), Take 1 tablet by mouth 3 (three) times a week., Disp: , Rfl:    levothyroxine (SYNTHROID) 100 MCG tablet, Take 1 tablet by mouth daily before breakfast., Disp: 30 tablet, Rfl: 3   metoprolol tartrate (LOPRESSOR) 25 MG tablet, Take 1 tablet (25 mg total) by mouth 2 (two) times daily., Disp: 60  tablet, Rfl: 0   nystatin (MYCOSTATIN) 100000 UNIT/ML suspension, Take 10 mLs (1,000,000 Units total) by mouth 4 (four) times daily. Swish and spit, Disp: 480 mL, Rfl: 1   prednisoLONE acetate (PRED FORTE) 1 % ophthalmic suspension, Place 1 drop into both eyes See admin instructions. Instill one drop into the right eye twice daily and one drop into the left eye once daily., Disp: , Rfl:     Cardiovascular & other pertient studies:  EKG 12/18/2021: Sinus rhythm 67 bpm Incomplete RBBB  Cardiac MRI 05/29/2021: 1. Focal gadolinium uptake in basal inferior lateral wall with pericardial involvement Elevated T2 in this region consistent with myocarditis 2.  Overall normal EF with no defined RWMA EF 57% 3.  Posterior MV leaflet prolapse with mild appearing MR 4.  Normal AV and aortic root 3.1 cm 5.  Normal RV size and function 6.  No pericardial effusion    Echocardiogram 05/29/2021:  1. Left ventricular ejection fraction, by estimation, is 55 to 60%. The  left ventricle has normal function. The left ventricle has no regional  wall motion abnormalities. Left ventricular diastolic parameters were  normal.   2. Right ventricular systolic function is normal. The right ventricular  size is normal. There is mildly elevated pulmonary artery systolic  pressure. The estimated right ventricular systolic pressure is 38.1 mmHg.   3. The mitral valve is degenerative. Mild mitral valve  regurgitation. No  evidence of mitral stenosis.   4. Tricuspid valve regurgitation is moderate.   5. The aortic valve is normal in structure. Aortic valve regurgitation is  not visualized. No aortic stenosis is present.   Coronary angiography 05/28/2021: LM: Normal  LAD: Mid 30% disease         Multiple diagonal branches supplying lateral wall         Diag 3, which actually comes up quite high off the LAD, with 50% ostial stenosis Lcx: No OM branches coming off proximal part of the vessel, possibly due to multiple high  diagonals         No definite evidence of an occluded proximal OM        Minimal luminal irregularities in prox LCx RCA: Dominant vessel. No significant disease   LVEDP, LVEF normal  Recent labs: 12/17/2021: Glucose 108, BUN/Cr 12/0.72. EGFR >60. Na/K 135/4.0. AST/ALT 91/83, ALKP 207. Rest of the CMP normal H/H 12/38. MCV 91. Platelets 160 TSH 6.9 high  05/28/2021: Chol 143., TG 86, HDL 31, LDL 95   Latest Reference Range & Units 10/15/21 09:35 10/21/21 11:26 11/05/21 08:56 11/19/21 12:45 11/26/21 07:46 12/17/21 12:27  B Natriuretic Peptide 0.0 - 100.0 pg/mL 221.0 (H) 196.0 (H) 230.0 (H) 276.0 (H) 178.0 (H)   LDH 98 - 192 U/L 183  168     Troponin I (High Sensitivity) <18 ng/L 22 (H) 20 (H) 145 (HH) _0 (HH): Data is critically high (H): Data is abnormally high   Latest Reference Range & Units 05/31/21 09:35 06/03/21 12:19 06/10/21 07:57  B Natriuretic Peptide 0.0 - 100.0 pg/mL  661.0 (H) 384.0 (H)  Troponin I (High Sensitivity) <18 ng/L 3,238 (HH) 1,563 (HH) 47 (H)  (HH): Data is critically high (H): Data is abnormally high   Review of Systems  Cardiovascular:  Negative for chest pain, dyspnea on exertion, leg swelling, palpitations and syncope.         Vitals:   12/18/21 1000  BP: (!) 143/71  Pulse: 73  Resp: 17  Temp: 97.7 F (36.5 C)  SpO2: 97%    Body mass index is 31.83 kg/m. Filed Weights   12/18/21 1000  Weight: 174 lb (78.9 kg)     Objective:   Physical Exam Vitals and nursing note reviewed.  Constitutional:      General: She is not in acute distress. Neck:     Vascular: No JVD.  Cardiovascular:     Rate and Rhythm: Normal rate and regular rhythm.     Heart sounds: Normal heart sounds. No murmur heard. Pulmonary:     Effort: Pulmonary effort is normal.     Breath sounds: Normal breath sounds. No wheezing or rales.  Musculoskeletal:     Right lower leg: No edema.     Left lower leg: No edema.           Assessment &  Recommendations:   81 year old Caucasian female with hypertension, malignant melanoma right leg, recent myocarditis (05/2021)  Myocarditis: Likely related to immunotherapy for malignant melanoma. Now resumed. I educated the patient to keep an eye on any recurrent symptoms of chest pain, shortness of breath. Given that myocarditis was picked up on MRI, in spite of normal echocardiogram, I do not see the value in surveillance echocardiogram. Patient does not wish to have repeat MRI. Continue clinical monitoring. In absence of bleeding reasonable to continue Aspirin 81 mg given high trop rise with her myocarditis episode. Continue statin. Should  she have to stop tylenol due to elevated LFTs and wishes to take NSAIDS, I would then stop Aspirin.  Hypertension: Fairly well controlled  F/u in 6 months    Nigel Mormon, MD Pager: [phone] Office: [phone]

## 2021-12-19 ENCOUNTER — Other Ambulatory Visit: Payer: Self-pay

## 2021-12-30 DIAGNOSIS — M810 Age-related osteoporosis without current pathological fracture: Secondary | ICD-10-CM | POA: Diagnosis not present

## 2021-12-30 DIAGNOSIS — I10 Essential (primary) hypertension: Secondary | ICD-10-CM | POA: Diagnosis not present

## 2021-12-30 DIAGNOSIS — C439 Malignant melanoma of skin, unspecified: Secondary | ICD-10-CM | POA: Diagnosis not present

## 2021-12-30 DIAGNOSIS — E7849 Other hyperlipidemia: Secondary | ICD-10-CM | POA: Diagnosis not present

## 2022-01-07 ENCOUNTER — Inpatient Hospital Stay: Payer: Medicare Other

## 2022-01-07 ENCOUNTER — Inpatient Hospital Stay (HOSPITAL_BASED_OUTPATIENT_CLINIC_OR_DEPARTMENT_OTHER): Payer: Medicare Other | Admitting: Hematology

## 2022-01-07 VITALS — BP 138/52 | HR 57 | Temp 97.7°F | Resp 18

## 2022-01-07 VITALS — BP 139/65 | HR 64 | Temp 97.6°F | Resp 18 | Ht 62.0 in | Wt 179.0 lb

## 2022-01-07 DIAGNOSIS — Z79899 Other long term (current) drug therapy: Secondary | ICD-10-CM | POA: Diagnosis not present

## 2022-01-07 DIAGNOSIS — I514 Myocarditis, unspecified: Secondary | ICD-10-CM

## 2022-01-07 DIAGNOSIS — E039 Hypothyroidism, unspecified: Secondary | ICD-10-CM | POA: Diagnosis not present

## 2022-01-07 DIAGNOSIS — C7951 Secondary malignant neoplasm of bone: Secondary | ICD-10-CM | POA: Diagnosis not present

## 2022-01-07 DIAGNOSIS — C4371 Malignant melanoma of right lower limb, including hip: Secondary | ICD-10-CM | POA: Diagnosis not present

## 2022-01-07 DIAGNOSIS — Z95828 Presence of other vascular implants and grafts: Secondary | ICD-10-CM

## 2022-01-07 DIAGNOSIS — Z5112 Encounter for antineoplastic immunotherapy: Secondary | ICD-10-CM | POA: Diagnosis not present

## 2022-01-07 DIAGNOSIS — I10 Essential (primary) hypertension: Secondary | ICD-10-CM | POA: Diagnosis not present

## 2022-01-07 LAB — CBC WITH DIFFERENTIAL/PLATELET
Abs Immature Granulocytes: 0.01 10*3/uL (ref 0.00–0.07)
Basophils Absolute: 0 10*3/uL (ref 0.0–0.1)
Basophils Relative: 1 %
Eosinophils Absolute: 0.1 10*3/uL (ref 0.0–0.5)
Eosinophils Relative: 2 %
HCT: 36.5 % (ref 36.0–46.0)
Hemoglobin: 11.8 g/dL — ABNORMAL LOW (ref 12.0–15.0)
Immature Granulocytes: 0 %
Lymphocytes Relative: 24 %
Lymphs Abs: 1.3 10*3/uL (ref 0.7–4.0)
MCH: 29.6 pg (ref 26.0–34.0)
MCHC: 32.3 g/dL (ref 30.0–36.0)
MCV: 91.5 fL (ref 80.0–100.0)
Monocytes Absolute: 0.4 10*3/uL (ref 0.1–1.0)
Monocytes Relative: 7 %
Neutro Abs: 3.6 10*3/uL (ref 1.7–7.7)
Neutrophils Relative %: 66 %
Platelets: 150 10*3/uL (ref 150–400)
RBC: 3.99 MIL/uL (ref 3.87–5.11)
RDW: 12.9 % (ref 11.5–15.5)
WBC: 5.5 10*3/uL (ref 4.0–10.5)
nRBC: 0 % (ref 0.0–0.2)

## 2022-01-07 LAB — COMPREHENSIVE METABOLIC PANEL
ALT: 43 U/L (ref 0–44)
AST: 44 U/L — ABNORMAL HIGH (ref 15–41)
Albumin: 3.5 g/dL (ref 3.5–5.0)
Alkaline Phosphatase: 161 U/L — ABNORMAL HIGH (ref 38–126)
Anion gap: 6 (ref 5–15)
BUN: 15 mg/dL (ref 8–23)
CO2: 28 mmol/L (ref 22–32)
Calcium: 8.9 mg/dL (ref 8.9–10.3)
Chloride: 103 mmol/L (ref 98–111)
Creatinine, Ser: 0.8 mg/dL (ref 0.44–1.00)
GFR, Estimated: >60 mL/min (ref >60)
Glucose, Bld: 110 mg/dL — ABNORMAL HIGH (ref 70–99)
Potassium: 4.4 mmol/L (ref 3.5–5.1)
Sodium: 137 mmol/L (ref 135–145)
Total Bilirubin: 1.1 mg/dL (ref 0.3–1.2)
Total Protein: 6.4 g/dL — ABNORMAL LOW (ref 6.5–8.1)

## 2022-01-07 LAB — TROPONIN I (HIGH SENSITIVITY): Troponin I (High Sensitivity): 11 ng/L (ref <18)

## 2022-01-07 LAB — BRAIN NATRIURETIC PEPTIDE: B Natriuretic Peptide: 422 pg/mL — ABNORMAL HIGH (ref 0.0–100.0)

## 2022-01-07 LAB — TSH: TSH: 1.958 u[IU]/mL (ref 0.350–4.500)

## 2022-01-07 MED ORDER — HEPARIN SOD (PORK) LOCK FLUSH 100 UNIT/ML IV SOLN
500.0000 [IU] | Freq: Once | INTRAVENOUS | Status: AC | PRN
  Administered 2022-01-07: 500 [IU]

## 2022-01-07 MED ORDER — SODIUM CHLORIDE 0.9 % IV SOLN
200.0000 mg | Freq: Once | INTRAVENOUS | Status: AC
  Administered 2022-01-07: 200 mg via INTRAVENOUS
  Filled 2022-01-07: qty 8

## 2022-01-07 MED ORDER — SODIUM CHLORIDE 0.9 % IV SOLN: Freq: Once | INTRAVENOUS | Status: AC

## 2022-01-07 MED ORDER — SODIUM CHLORIDE 0.9% FLUSH
10.0000 mL | INTRAVENOUS | Status: DC | PRN
  Administered 2022-01-07: 10 mL

## 2022-01-07 NOTE — Addendum Note (Signed)
Addended by: Derek Jack on: 01/07/2022 09:33 PM   Modules accepted: Orders

## 2022-01-07 NOTE — Patient Instructions (Signed)
[facility]  Discharge Instructions: Thank you for choosing [facility] to provide your oncology and hematology care.  If you have a lab appointment with the Cancer Center, please come in thru the Main Entrance and check in at the main information desk.  Wear comfortable clothing and clothing appropriate for easy access to any Portacath or PICC line.   We strive to give you quality time with your provider. You may need to reschedule your appointment if you arrive late (15 or more minutes).  Arriving late affects you and other patients whose appointments are after yours.  Also, if you miss three or more appointments without notifying the office, you may be dismissed from the clinic at the provider's discretion.      For prescription refill requests, have your pharmacy contact our office and allow 72 hours for refills to be completed.    Today you received the following chemotherapy and/or immunotherapy agents Keytruda.      To help prevent nausea and vomiting after your treatment, we encourage you to take your nausea medication as directed.  BELOW ARE SYMPTOMS THAT SHOULD BE REPORTED IMMEDIATELY: *FEVER GREATER THAN 100.4 F (38 C) OR HIGHER *CHILLS OR SWEATING *NAUSEA AND VOMITING THAT IS NOT CONTROLLED WITH YOUR NAUSEA MEDICATION *UNUSUAL SHORTNESS OF BREATH *UNUSUAL BRUISING OR BLEEDING *URINARY PROBLEMS (pain or burning when urinating, or frequent urination) *BOWEL PROBLEMS (unusual diarrhea, constipation, pain near the anus) TENDERNESS IN MOUTH AND THROAT WITH OR WITHOUT PRESENCE OF ULCERS (sore throat, sores in mouth, or a toothache) UNUSUAL RASH, SWELLING OR PAIN  UNUSUAL VAGINAL DISCHARGE OR ITCHING   Items with * indicate a potential emergency and should be followed up as soon as possible or go to the Emergency Department if any problems should occur.  Please show the CHEMOTHERAPY ALERT CARD or IMMUNOTHERAPY ALERT CARD at check-in to the  Emergency Department and triage nurse.  Should you have questions after your visit or need to cancel or reschedule your appointment, please contact [facility] [phone]  and follow the prompts.  Office hours are 8:00 a.m. to 4:30 p.m. Monday - Friday. Please note that voicemails left after 4:00 p.m. may not be returned until the following business day.  We are closed weekends and major holidays. You have access to a nurse at all times for urgent questions. Please call the main number to the clinic [phone] and follow the prompts.  For any non-urgent questions, you may also contact your provider using MyChart. We now offer e-Visits for anyone 18 and older to request care online for non-urgent symptoms. For details visit [email or web address].   Also download the MyChart app! Go to the app store, search "MyChart", open the app, select [place], and log in with your MyChart username and password.  Masks are optional in the cancer centers. If you would like for your care team to wear a mask while they are taking care of you, please let them know. You may have one support person who is at least 81 years old accompany you for your appointments.  Pembrolizumab Injection What is this medication? PEMBROLIZUMAB (PEM broe LIZ ue mab) treats some types of cancer. It works by helping your immune system slow or stop the spread of cancer cells. It is a monoclonal antibody. This medicine may be used for other purposes; ask your health care provider or pharmacist if you have questions. COMMON BRAND NAME(S): Keytruda What should I tell my care team before   I take this medication? They need to know if you have any of these conditions: Allogeneic stem cell transplant (uses someone else's stem cells) Autoimmune diseases, such as Crohn disease, ulcerative colitis, lupus History of chest radiation Nervous system problems, such as Guillain-Barre syndrome, myasthenia gravis Organ  transplant An unusual or allergic reaction to pembrolizumab, other medications, foods, dyes, or preservatives Pregnant or trying to get pregnant Breast-feeding How should I use this medication? This medication is injected into a vein. It is given by your care team in a hospital or clinic setting. A special MedGuide will be given to you before each treatment. Be sure to read this information carefully each time. Talk to your care team about the use of this medication in children. While it may be prescribed for children as young as 6 months for selected conditions, precautions do apply. Overdosage: If you think you have taken too much of this medicine contact a poison control center or emergency room at once. NOTE: This medicine is only for you. Do not share this medicine with others. What if I miss a dose? Keep appointments for follow-up doses. It is important not to miss your dose. Call your care team if you are unable to keep an appointment. What may interact with this medication? Interactions have not been studied. This list may not describe all possible interactions. Give your health care provider a list of all the medicines, herbs, non-prescription drugs, or dietary supplements you use. Also tell them if you smoke, drink alcohol, or use illegal drugs. Some items may interact with your medicine. What should I watch for while using this medication? Your condition will be monitored carefully while you are receiving this medication. You may need blood work while taking this medication. This medication may cause serious skin reactions. They can happen weeks to months after starting the medication. Contact your care team right away if you notice fevers or flu-like symptoms with a rash. The rash may be red or purple and then turn into blisters or peeling of the skin. You may also notice a red rash with swelling of the face, lips, or lymph nodes in your neck or under your arms. Tell your care team  right away if you have any change in your eyesight. Talk to your care team if you may be pregnant. Serious birth defects can occur if you take this medication during pregnancy and for 4 months after the last dose. You will need a negative pregnancy test before starting this medication. Contraception is recommended while taking this medication and for 4 months after the last dose. Your care team can help you find the option that works for you. Do not breastfeed while taking this medication and for 4 months after the last dose. What side effects may I notice from receiving this medication? Side effects that you should report to your care team as soon as possible: Allergic reactions--skin rash, itching, hives, swelling of the face, lips, tongue, or throat Dry cough, shortness of breath or trouble breathing Eye pain, redness, irritation, or discharge with blurry or decreased vision Heart muscle inflammation--unusual weakness or fatigue, shortness of breath, chest pain, fast or irregular heartbeat, dizziness, swelling of the ankles, feet, or hands Hormone gland problems--headache, sensitivity to light, unusual weakness or fatigue, dizziness, fast or irregular heartbeat, increased sensitivity to cold or heat, excessive sweating, constipation, hair loss, increased thirst or amount of urine, tremors or shaking, irritability Infusion reactions--chest pain, shortness of breath or trouble breathing, feeling faint or lightheaded   Kidney injury (glomerulonephritis)--decrease in the amount of urine, red or dark brown urine, foamy or bubbly urine, swelling of the ankles, hands, or feet Liver injury--right upper belly pain, loss of appetite, nausea, light-colored stool, dark yellow or brown urine, yellowing skin or eyes, unusual weakness or fatigue Pain, tingling, or numbness in the hands or feet, muscle weakness, change in vision, confusion or trouble speaking, loss of balance or coordination, trouble walking,  seizures Rash, fever, and swollen lymph nodes Redness, blistering, peeling, or loosening of the skin, including inside the mouth Sudden or severe stomach pain, bloody diarrhea, fever, nausea, vomiting Side effects that usually do not require medical attention (report to your care team if they continue or are bothersome): Bone, joint, or muscle pain Diarrhea Fatigue Loss of appetite Nausea Skin rash This list may not describe all possible side effects. Call your doctor for medical advice about side effects. You may report side effects to FDA at 1-800-FDA-1088. Where should I keep my medication? This medication is given in a hospital or clinic. It will not be stored at home. NOTE: This sheet is a summary. It may not cover all possible information. If you have questions about this medicine, talk to your doctor, pharmacist, or health care provider.  2023 Elsevier/Gold Standard (2021-08-19 00:00:00)  

## 2022-01-07 NOTE — Patient Instructions (Addendum)
[place] at [facility] Discharge Instructions   You were seen and examined today by Dr. Delton Coombes.  He reviewed the results of your lab work which are normal/stable.   We will proceed with your treatment today.   Return as scheduled for treatment and office visit.    Thank you for choosing [place] at [facility] to provide your oncology and hematology care.  To afford each patient quality time with our provider, please arrive at least 15 minutes before your scheduled appointment time.   If you have a lab appointment with the [facility] please come in thru the Main Entrance and check in at the main information desk.  You need to re-schedule your appointment should you arrive 10 or more minutes late.  We strive to give you quality time with our providers, and arriving late affects you and other patients whose appointments are after yours.  Also, if you no show three or more times for appointments you may be dismissed from the clinic at the providers discretion.     Again, thank you for choosing [facility].  Our hope is that these requests will decrease the amount of time that you wait before being seen by our physicians.       _____________________________________________________________  Should you have questions after your visit to [facility], please contact our office at [phone] and follow the prompts.  Our office hours are 8:00 a.m. and 4:30 p.m. Monday - Friday.  Please note that voicemails left after 4:00 p.m. may not be returned until the following business day.  We are closed weekends and major holidays.  You do have access to a nurse 24-7, just call the main number to the clinic [phone] and do not press any options, hold on the line and a nurse will answer the phone.    For prescription refill requests, have your pharmacy contact our office and allow 72 hours.    Due to Covid, you will need  to wear a mask upon entering the hospital. If you do not have a mask, a mask will be given to you at the Main Entrance upon arrival. For doctor visits, patients may have 1 support person age 33 or older with them. For treatment visits, patients can not have anyone with them due to social distancing guidelines and our immunocompromised population.

## 2022-01-07 NOTE — Progress Notes (Signed)
Patient has been examined by Dr. Katragadda, and vital signs and labs have been reviewed. ANC, Creatinine, LFTs, hemoglobin, and platelets are within treatment parameters per M.D. - pt may proceed with treatment.  Primary RN and pharmacy notified.  

## 2022-01-07 NOTE — Progress Notes (Signed)
Pt presents today for Keytruda per provider's order. Vital signs and labs WNL for treatment today.Okay to proceed with treatment today per Dr.K ? ?Keytruda given today per MD orders. Tolerated infusion without adverse affects. Vital signs stable. No complaints at this time. Discharged from clinic ambulatory in stable condition. Alert and oriented x 3. F/U with [facility] as scheduled.   ?

## 2022-01-07 NOTE — Progress Notes (Signed)
[place], [place] [address]   CLINIC:  Medical Oncology/Hematology  PCP:  Neale Burly, MD [place] / [place] [address] [phone]   REASON FOR VISIT:  Follow-up for metastatic malignant melanoma, BRAF V600 negative  PRIOR THERAPY: none  NGS Results: BRAF negative, positive for NRAS pathogenic variant exon 3, TERT promoter.  MSI-stable.  MMR-proficient.  [place] 1/2/3 fusion not detected.  KIT mutation negative.  PD-L1 negative.  CURRENT THERAPY: Pembrolizumab (200) q21d Keytruda  BRIEF ONCOLOGIC HISTORY:  Oncology History  Malignant melanoma of lower leg, right ([place])  03/27/2021 Initial Diagnosis   Malignant melanoma of lower leg, right ([place])   05/09/2021 - 05/09/2021 Chemotherapy   Patient is on Treatment Plan : MELANOMA - [place] (nivolumab/relatlimab) q28d     09/03/2021 -  Chemotherapy   Patient is on Treatment Plan : MELANOMA Pembrolizumab (200) q21d       CANCER STAGING:  Cancer Staging  Malignant melanoma of lower leg, right ([place]) Staging form: Melanoma of the Skin, AJCC 8th Edition - Clinical stage from 03/27/2021: Stage IV (cT4a, cN3, cM1) - Unsigned   INTERVAL HISTORY:  Elizabeth Norman, a 81 y.o. female, seen for follow-up of metastatic melanoma.  She is tolerating Keytruda very well.  Energy levels are 90%.  Reports urinary frequency at nighttime as she is drinking more fluids due to dry mouth from [facility].  REVIEW OF SYSTEMS:  Review of Systems  Constitutional:  Negative for appetite change and fatigue.  Genitourinary:  Positive for frequency.   Psychiatric/Behavioral:  Positive for sleep disturbance.   All other systems reviewed and are negative.   PAST MEDICAL/SURGICAL HISTORY:  Past Medical History:  Diagnosis Date   Arthritis    Hypertension    Malignant melanoma of lower leg, right ([place]) 03/27/2021   Port-A-Cath in place 04/30/2021   Past Surgical History:  Procedure Laterality Date    CATARACT EXTRACTION W/PHACO Right 10/03/2014   Procedure: CATARACT EXTRACTION PHACO AND INTRAOCULAR LENS PLACEMENT ([facility]);  Surgeon: [name], MD;  Location: [facility];  Service: Ophthalmology;  Laterality: Right;  CDE:10.09   CATARACT EXTRACTION W/PHACO Left 10/10/2014   Procedure: CATARACT EXTRACTION PHACO AND INTRAOCULAR LENS PLACEMENT (IOC);  Surgeon: [name], MD;  Location: [facility];  Service: Ophthalmology;  Laterality: Left;  CDE:8.69   CORNEAL 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   CORNEAL TRANSPLANT Left 2020   DENTAL RESTORATION/EXTRACTION WITH X-RAY     IR IMAGING GUIDED PORT INSERTION  04/05/2021   LEFT HEART CATH AND CORONARY ANGIOGRAPHY N/A 05/28/2021   Procedure: LEFT HEART CATH AND CORONARY ANGIOGRAPHY;  Surgeon: Nigel Mormon, MD;  Location: [facility];  Service: Cardiovascular;  Laterality: N/A;   MELANOMA EXCISION Right 02/12/2021   Procedure: WIDE LOCAL EXCISION RIGHT LOWER LEG MELANOMA , ADVANCEMENT FLAP CLOSURE DEFECT;  Surgeon: [name], MD;  Location: [facility];  Service: General;  Laterality: Right;   SENTINEL NODE BIOPSY Right 02/12/2021   Procedure: SENTINEL NODE BIOPSY;  Surgeon: [name], MD;  Location: [facility];  Service: General;  Laterality: Right;    SOCIAL HISTORY:  Social History   Socioeconomic History   Marital status: Widowed    Spouse name: Not on file   Number of children: 3   Years of education: Not on file   Highest education level: Not on file  Occupational History   Not on file  Tobacco Use   Smoking status: Never   Smokeless tobacco:  Never  Vaping Use   Vaping Use: Never used  Substance and Sexual Activity   Alcohol use: No   Drug use: No   Sexual activity: Not on file  Other Topics Concern   Not on file  Social History Narrative   Not on file   Social Determinants of Health   Financial Resource Strain: Not on file  Food Insecurity: Not on file  Transportation Needs: Not on file  Physical Activity: Not on file   Stress: Not on file  Social Connections: Not on file  Intimate Partner Violence: Not on file    FAMILY HISTORY:  Family History  Problem Relation Age of Onset   Cancer Father    Hypertension Sister    Hypertension Sister     CURRENT MEDICATIONS:  Current Outpatient Medications  Medication Sig Dispense Refill   alendronate (FOSAMAX) 70 MG tablet Take 70 mg by mouth once a week.  0   ALPRAZolam (XANAX) 0.5 MG tablet Take 0.5 mg by mouth 3 (three) times daily.  2   aspirin EC 81 MG EC tablet Take 1 tablet (81 mg total) by mouth daily. Swallow whole. 30 tablet 11   atorvastatin (LIPITOR) 40 MG tablet Take 1 tablet (40 mg total) by mouth daily. 30 tablet 0   calcium carbonate (OS-CAL - DOSED IN MG OF ELEMENTAL CALCIUM) 1250 (500 CA) MG tablet Take 1 tablet by mouth 3 (three) times a week.     cholecalciferol (VITAMIN D) 1000 UNITS tablet Take 1,000 Units by mouth 3 (three) times a week.     Cyanocobalamin (VITAMIN B-12 PO) Take 1 tablet by mouth 3 (three) times a week.     levothyroxine (SYNTHROID) 100 MCG tablet Take 1 tablet by mouth daily before breakfast. 30 tablet 3   metoprolol tartrate (LOPRESSOR) 25 MG tablet Take 1 tablet (25 mg total) by mouth 2 (two) times daily. 60 tablet 0   nystatin (MYCOSTATIN) 100000 UNIT/ML suspension Take 10 mLs (1,000,000 Units total) by mouth 4 (four) times daily. Swish and spit 480 mL 1   prednisoLONE acetate (PRED FORTE) 1 % ophthalmic suspension Place 1 drop into both eyes See admin instructions. Instill one drop into the right eye twice daily and one drop into the left eye once daily.     No current facility-administered medications for this visit.    ALLERGIES:  Allergies  Allergen Reactions   Tape     Pulls skin, please use paper tape    PHYSICAL EXAM:  Performance status (ECOG): 0 - Asymptomatic  Vitals:   01/07/22 1143  BP: 139/65  Pulse: 64  Resp: 18  Temp: 97.6 F (36.4 C)  SpO2: 98%    Wt Readings from Last 3 Encounters:   01/07/22 179 lb 0.2 oz (81.2 kg)  12/18/21 174 lb (78.9 kg)  12/17/21 174 lb 6.1 oz (79.1 kg)   Physical Exam Vitals reviewed.  Constitutional:      Appearance: Normal appearance.  Cardiovascular:     Rate and Rhythm: Normal rate and regular rhythm.     Pulses: Normal pulses.     Heart sounds: Normal heart sounds.  Pulmonary:     Breath sounds: Normal breath sounds.  Neurological:     General: No focal deficit present.     Mental Status: She is alert and oriented to person, place, and time.  Psychiatric:        Mood and Affect: Mood normal.        Behavior: Behavior normal.  LABORATORY DATA:  I have reviewed the labs as listed.     Latest Ref Rng & Units 01/07/2022   11:39 AM 12/17/2021   12:27 PM 11/26/2021    7:46 AM  CBC  WBC 4.0 - 10.5 K/uL 5.5  5.8  5.6   Hemoglobin 12.0 - 15.0 g/dL 11.8  12.8  13.0   Hematocrit 36.0 - 46.0 % 36.5  38.9  39.1   Platelets 150 - 400 K/uL 150  160  177       Latest Ref Rng & Units 12/17/2021   12:27 PM 11/26/2021    7:46 AM 11/19/2021   12:45 PM  CMP  Glucose 70 - 99 mg/dL 108  101  133   BUN 8 - 23 mg/dL 12  11  13    Creatinine 0.44 - 1.00 mg/dL 0.72  0.69  0.73   Sodium 135 - 145 mmol/L 135  133  136   Potassium 3.5 - 5.1 mmol/L 4.0  3.9  4.7   Chloride 98 - 111 mmol/L 102  98  102   CO2 22 - 32 mmol/L 29  28  28    Calcium 8.9 - 10.3 mg/dL 9.0  8.7  9.1   Total Protein 6.5 - 8.1 g/dL 6.8  6.4  6.5   Total Bilirubin 0.3 - 1.2 mg/dL 1.1  0.9  1.0   Alkaline Phos 38 - 126 U/L 207  137  137   AST 15 - 41 U/L 91  93  41   ALT 0 - 44 U/L 83  66  44     DIAGNOSTIC IMAGING:  I have independently reviewed the scans and discussed with the patient. No results found.   ASSESSMENT:  Malignant melanoma of right posterior leg (pT4 pN3 M1): - She noticed fleshy lesion 4 months ago on the right leg. - Her PMD did a biopsy which was consistent with melanoma. - Wide local excision and lymph node biopsy by Dr. Barry Dienes on 02/12/2021. -  Pathology margins free, nodular type, Breslow's thickness 9.52 mm, Clark level V, deep margins free, ulceration absent, satellitosis absent, mitotic index 2/millimeters squared, LVI negative.  Neurotropism absent.  Tumor infiltrating lymphocytes absent.  Tumor regression not noted.  3 out of 3 positive sentinel lymph nodes for melanoma. - PET CT scan on 03/22/2021 with 3 foci of intense radiotracer activity within the right lower extremity.  In the medial right upper thigh nodule just superficial to thigh musculature measuring 11 mm with SUV 7.6.  Intramedullary hypermetabolic activity within the shaft of the mid right femur with SUV 14.6.  Intense activity associated with condylar notch of the distal right femur, appears to localize to soft tissue rather than the bone.  More diffuse activity associated with the medial right calf related to excision biopsy.  There is a focus of activity adjacent to the lateral margin of the right iliac wing SUV 4.1.  No evidence of visceral metastatic disease in the chest, abdomen or pelvis. 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 - NGS testing-BRAF negative, positive for NRAS pathogenic variant exon 3, TERT promoter.  MSI-stable.  MMR-proficient.  [place] 1/2/3 fusion not detected.  KIT mutation negative.  PD-L1 negative. - Nivolumab-Relatlimab every 28 days started on 05/09/2021.  Held permanently after first dose due to myocarditis. - PET scan on 07/25/2021: Slight progression with  no new sites of metastatic disease. - Pembrolizumab started on 09/03/2021    Social/family history: - She is seen with her son today.  She lives by herself at home.  She is independent of ADLs and IADLs.  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  She is non-smoker. - Father had colon  cancer.  Sister had breast cancer and colon cancer.  Maternal aunt had breast cancer.  Paternal uncle had colon cancer.   PLAN:  Malignant melanoma of the right posterior leg (pT4 pN3 M1), BRAF V600 negative: - PET scan (11/14/2021): Significant interval decrease in size of the soft tissue nodules in the right thigh with no new lesions.  Mildly metabolic heterogeneous macroscopic fat-containing masslike area posterior to the rectum not significantly changed from #2022, suspicious for low-grade liposarcoma/sacrococcygeal teratoma. - These findings will be followed up on the subsequent scan. - She does not have any immunotherapy related side effects.  Labs today shows improved LFTs but slightly elevated.  Troponin is normal.  TSH was 1.9.  Proceed with treatment today and in 3 weeks.  Hold next dose of Keytruda if troponin more than 20.  RTC 6 weeks for follow-up.  Recommend whole-body PET CT scan along with other labs.  2.  History of immunotherapy myocarditis: - She does not have any symptoms of chest pain.  Troponin today is remaining in the normal range although BNP is elevated.  We will continue close monitoring.  Continue follow-up with cardiology Dr. Virgina Jock  3.  Right medial thigh pain: - She does not report any pain at this time.  4.  Hypothyroidism: - Continue Synthroid 100 mcg daily.  TSH is 1.9.   Orders placed this encounter:  No orders of the defined types were placed in this encounter.    Derek Jack, MD [place] [phone]

## 2022-01-08 DIAGNOSIS — Z947 Corneal transplant status: Secondary | ICD-10-CM | POA: Diagnosis not present

## 2022-01-08 DIAGNOSIS — Z961 Presence of intraocular lens: Secondary | ICD-10-CM | POA: Diagnosis not present

## 2022-01-08 DIAGNOSIS — H26491 Other secondary cataract, right eye: Secondary | ICD-10-CM | POA: Diagnosis not present

## 2022-01-29 ENCOUNTER — Inpatient Hospital Stay: Payer: Medicare Other

## 2022-01-29 ENCOUNTER — Inpatient Hospital Stay: Payer: Medicare Other | Attending: Hematology

## 2022-01-29 VITALS — BP 137/66 | HR 77 | Temp 98.6°F | Resp 16

## 2022-01-29 DIAGNOSIS — Z95828 Presence of other vascular implants and grafts: Secondary | ICD-10-CM

## 2022-01-29 DIAGNOSIS — C7951 Secondary malignant neoplasm of bone: Secondary | ICD-10-CM | POA: Insufficient documentation

## 2022-01-29 DIAGNOSIS — C779 Secondary and unspecified malignant neoplasm of lymph node, unspecified: Secondary | ICD-10-CM | POA: Insufficient documentation

## 2022-01-29 DIAGNOSIS — C4371 Malignant melanoma of right lower limb, including hip: Secondary | ICD-10-CM | POA: Insufficient documentation

## 2022-01-29 DIAGNOSIS — Z79899 Other long term (current) drug therapy: Secondary | ICD-10-CM | POA: Diagnosis not present

## 2022-01-29 DIAGNOSIS — E039 Hypothyroidism, unspecified: Secondary | ICD-10-CM | POA: Insufficient documentation

## 2022-01-29 DIAGNOSIS — Z5112 Encounter for antineoplastic immunotherapy: Secondary | ICD-10-CM | POA: Diagnosis not present

## 2022-01-29 DIAGNOSIS — I514 Myocarditis, unspecified: Secondary | ICD-10-CM | POA: Insufficient documentation

## 2022-01-29 DIAGNOSIS — I409 Acute myocarditis, unspecified: Secondary | ICD-10-CM

## 2022-01-29 DIAGNOSIS — R7989 Other specified abnormal findings of blood chemistry: Secondary | ICD-10-CM | POA: Insufficient documentation

## 2022-01-29 DIAGNOSIS — Z298 Encounter for other specified prophylactic measures: Secondary | ICD-10-CM

## 2022-01-29 LAB — COMPREHENSIVE METABOLIC PANEL
ALT: 47 U/L — ABNORMAL HIGH (ref 0–44)
AST: 59 U/L — ABNORMAL HIGH (ref 15–41)
Albumin: 3.4 g/dL — ABNORMAL LOW (ref 3.5–5.0)
Alkaline Phosphatase: 202 U/L — ABNORMAL HIGH (ref 38–126)
Anion gap: 6 (ref 5–15)
BUN: 10 mg/dL (ref 8–23)
CO2: 29 mmol/L (ref 22–32)
Calcium: 8.7 mg/dL — ABNORMAL LOW (ref 8.9–10.3)
Chloride: 103 mmol/L (ref 98–111)
Creatinine, Ser: 0.74 mg/dL (ref 0.44–1.00)
GFR, Estimated: >60 mL/min (ref >60)
Glucose, Bld: 126 mg/dL — ABNORMAL HIGH (ref 70–99)
Potassium: 3.9 mmol/L (ref 3.5–5.1)
Sodium: 138 mmol/L (ref 135–145)
Total Bilirubin: 0.8 mg/dL (ref 0.3–1.2)
Total Protein: 6.5 g/dL (ref 6.5–8.1)

## 2022-01-29 LAB — CBC WITH DIFFERENTIAL/PLATELET
Abs Immature Granulocytes: 0.02 10*3/uL (ref 0.00–0.07)
Basophils Absolute: 0 10*3/uL (ref 0.0–0.1)
Basophils Relative: 1 %
Eosinophils Absolute: 0.1 10*3/uL (ref 0.0–0.5)
Eosinophils Relative: 2 %
HCT: 37.5 % (ref 36.0–46.0)
Hemoglobin: 12.4 g/dL (ref 12.0–15.0)
Immature Granulocytes: 0 %
Lymphocytes Relative: 16 %
Lymphs Abs: 0.9 10*3/uL (ref 0.7–4.0)
MCH: 30.5 pg (ref 26.0–34.0)
MCHC: 33.1 g/dL (ref 30.0–36.0)
MCV: 92.1 fL (ref 80.0–100.0)
Monocytes Absolute: 0.4 10*3/uL (ref 0.1–1.0)
Monocytes Relative: 7 %
Neutro Abs: 4.4 10*3/uL (ref 1.7–7.7)
Neutrophils Relative %: 74 %
Platelets: 154 10*3/uL (ref 150–400)
RBC: 4.07 MIL/uL (ref 3.87–5.11)
RDW: 12.5 % (ref 11.5–15.5)
WBC: 5.9 10*3/uL (ref 4.0–10.5)
nRBC: 0 % (ref 0.0–0.2)

## 2022-01-29 LAB — MAGNESIUM: Magnesium: 1.7 mg/dL (ref 1.7–2.4)

## 2022-01-29 LAB — BRAIN NATRIURETIC PEPTIDE: B Natriuretic Peptide: 269 pg/mL — ABNORMAL HIGH (ref 0.0–100.0)

## 2022-01-29 LAB — TSH: TSH: 4.665 u[IU]/mL — ABNORMAL HIGH (ref 0.350–4.500)

## 2022-01-29 LAB — TROPONIN I (HIGH SENSITIVITY): Troponin I (High Sensitivity): 10 ng/L (ref <18)

## 2022-01-29 MED ORDER — HEPARIN SOD (PORK) LOCK FLUSH 100 UNIT/ML IV SOLN
500.0000 [IU] | Freq: Once | INTRAVENOUS | Status: AC | PRN
  Administered 2022-01-29: 500 [IU]

## 2022-01-29 MED ORDER — SODIUM CHLORIDE 0.9 % IV SOLN: Freq: Once | INTRAVENOUS | Status: AC

## 2022-01-29 MED ORDER — SODIUM CHLORIDE 0.9 % IV SOLN
200.0000 mg | Freq: Once | INTRAVENOUS | Status: AC
  Administered 2022-01-29: 200 mg via INTRAVENOUS
  Filled 2022-01-29: qty 8

## 2022-01-29 MED ORDER — SODIUM CHLORIDE 0.9% FLUSH
10.0000 mL | INTRAVENOUS | Status: DC | PRN
  Administered 2022-01-29: 10 mL

## 2022-01-29 NOTE — Progress Notes (Signed)
Patients port flushed without difficulty.  Good blood return noted with no bruising or swelling noted at site.  Patient remains accessed for chemotherapy treatment.  

## 2022-01-29 NOTE — Patient Instructions (Signed)
[facility]  Discharge Instructions: Thank you for choosing [facility] to provide your oncology and hematology care.  If you have a lab appointment with the Cancer Center, please come in thru the Main Entrance and check in at the main information desk.  Wear comfortable clothing and clothing appropriate for easy access to any Portacath or PICC line.   We strive to give you quality time with your provider. You may need to reschedule your appointment if you arrive late (15 or more minutes).  Arriving late affects you and other patients whose appointments are after yours.  Also, if you miss three or more appointments without notifying the office, you may be dismissed from the clinic at the provider's discretion.      For prescription refill requests, have your pharmacy contact our office and allow 72 hours for refills to be completed.    Today you received the following chemotherapy and/or immunotherapy agents Keytruda      To help prevent nausea and vomiting after your treatment, we encourage you to take your nausea medication as directed.  BELOW ARE SYMPTOMS THAT SHOULD BE REPORTED IMMEDIATELY: *FEVER GREATER THAN 100.4 F (38 C) OR HIGHER *CHILLS OR SWEATING *NAUSEA AND VOMITING THAT IS NOT CONTROLLED WITH YOUR NAUSEA MEDICATION *UNUSUAL SHORTNESS OF BREATH *UNUSUAL BRUISING OR BLEEDING *URINARY PROBLEMS (pain or burning when urinating, or frequent urination) *BOWEL PROBLEMS (unusual diarrhea, constipation, pain near the anus) TENDERNESS IN MOUTH AND THROAT WITH OR WITHOUT PRESENCE OF ULCERS (sore throat, sores in mouth, or a toothache) UNUSUAL RASH, SWELLING OR PAIN  UNUSUAL VAGINAL DISCHARGE OR ITCHING   Items with * indicate a potential emergency and should be followed up as soon as possible or go to the Emergency Department if any problems should occur.  Please show the CHEMOTHERAPY ALERT CARD or IMMUNOTHERAPY ALERT CARD at check-in to the  Emergency Department and triage nurse.  Should you have questions after your visit or need to cancel or reschedule your appointment, please contact [facility] [phone]  and follow the prompts.  Office hours are 8:00 a.m. to 4:30 p.m. Monday - Friday. Please note that voicemails left after 4:00 p.m. may not be returned until the following business day.  We are closed weekends and major holidays. You have access to a nurse at all times for urgent questions. Please call the main number to the clinic [phone] and follow the prompts.  For any non-urgent questions, you may also contact your provider using MyChart. We now offer e-Visits for anyone 18 and older to request care online for non-urgent symptoms. For details visit [email or web address].   Also download the MyChart app! Go to the app store, search "MyChart", open the app, select [facility], and log in with your MyChart username and password.  Masks are optional in the cancer centers. If you would like for your care team to wear a mask while they are taking care of you, please let them know. You may have one support person who is at least 81 years old accompany you for your appointments.  

## 2022-01-29 NOTE — Progress Notes (Signed)
Patient presents today for Keytruda infusion per providers order.  Vital signs and labs within parameters for treatment.  Patient has no new complaints at this time.  Treatment given today per MD orders.  Stable during infusion without adverse affects.  Vital signs stable.  No complaints at this time.  Discharge from clinic ambulatory in stable condition.  Alert and oriented X 3.  Follow up with [facility] as scheduled.  

## 2022-02-13 ENCOUNTER — Encounter (HOSPITAL_COMMUNITY)
Admission: RE | Admit: 2022-02-13 | Discharge: 2022-02-13 | Disposition: A | Discharge status: Home / Self Care | Payer: Medicare Other | Source: Ambulatory Visit | Attending: Hematology | Admitting: Hematology

## 2022-02-13 DIAGNOSIS — C439 Malignant melanoma of skin, unspecified: Secondary | ICD-10-CM | POA: Diagnosis not present

## 2022-02-13 DIAGNOSIS — C4371 Malignant melanoma of right lower limb, including hip: Secondary | ICD-10-CM | POA: Diagnosis not present

## 2022-02-13 MED ORDER — FLUDEOXYGLUCOSE F - 18 (FDG) INJECTION
9.2300 | Freq: Once | INTRAVENOUS | Status: AC | PRN
  Administered 2022-02-13: 9.23 via INTRAVENOUS

## 2022-02-19 ENCOUNTER — Inpatient Hospital Stay: Payer: Medicare Other | Attending: Hematology | Admitting: Hematology

## 2022-02-19 ENCOUNTER — Inpatient Hospital Stay: Payer: Medicare Other

## 2022-02-19 ENCOUNTER — Encounter: Payer: Self-pay | Admitting: Lab

## 2022-02-19 VITALS — BP 127/50 | HR 65 | Temp 97.9°F | Resp 18

## 2022-02-19 DIAGNOSIS — I10 Essential (primary) hypertension: Secondary | ICD-10-CM | POA: Insufficient documentation

## 2022-02-19 DIAGNOSIS — C4371 Malignant melanoma of right lower limb, including hip: Secondary | ICD-10-CM

## 2022-02-19 DIAGNOSIS — Z5112 Encounter for antineoplastic immunotherapy: Secondary | ICD-10-CM | POA: Diagnosis not present

## 2022-02-19 DIAGNOSIS — C439 Malignant melanoma of skin, unspecified: Secondary | ICD-10-CM

## 2022-02-19 DIAGNOSIS — Z79899 Other long term (current) drug therapy: Secondary | ICD-10-CM | POA: Diagnosis not present

## 2022-02-19 DIAGNOSIS — C779 Secondary and unspecified malignant neoplasm of lymph node, unspecified: Secondary | ICD-10-CM | POA: Diagnosis not present

## 2022-02-19 DIAGNOSIS — E039 Hypothyroidism, unspecified: Secondary | ICD-10-CM | POA: Diagnosis not present

## 2022-02-19 DIAGNOSIS — Z95828 Presence of other vascular implants and grafts: Secondary | ICD-10-CM

## 2022-02-19 DIAGNOSIS — R7401 Elevation of levels of liver transaminase levels: Secondary | ICD-10-CM | POA: Insufficient documentation

## 2022-02-19 DIAGNOSIS — C7951 Secondary malignant neoplasm of bone: Secondary | ICD-10-CM | POA: Diagnosis not present

## 2022-02-19 LAB — CBC WITH DIFFERENTIAL/PLATELET
Abs Immature Granulocytes: 0.01 10*3/uL (ref 0.00–0.07)
Basophils Absolute: 0.1 10*3/uL (ref 0.0–0.1)
Basophils Relative: 1 %
Eosinophils Absolute: 0.1 10*3/uL (ref 0.0–0.5)
Eosinophils Relative: 2 %
HCT: 36.7 % (ref 36.0–46.0)
Hemoglobin: 12.1 g/dL (ref 12.0–15.0)
Immature Granulocytes: 0 %
Lymphocytes Relative: 24 %
Lymphs Abs: 1.2 10*3/uL (ref 0.7–4.0)
MCH: 30 pg (ref 26.0–34.0)
MCHC: 33 g/dL (ref 30.0–36.0)
MCV: 91.1 fL (ref 80.0–100.0)
Monocytes Absolute: 0.4 10*3/uL (ref 0.1–1.0)
Monocytes Relative: 8 %
Neutro Abs: 3.2 10*3/uL (ref 1.7–7.7)
Neutrophils Relative %: 65 %
Platelets: 175 10*3/uL (ref 150–400)
RBC: 4.03 MIL/uL (ref 3.87–5.11)
RDW: 12.5 % (ref 11.5–15.5)
WBC: 5 10*3/uL (ref 4.0–10.5)
nRBC: 0 % (ref 0.0–0.2)

## 2022-02-19 LAB — COMPREHENSIVE METABOLIC PANEL
ALT: 47 U/L — ABNORMAL HIGH (ref 0–44)
AST: 63 U/L — ABNORMAL HIGH (ref 15–41)
Albumin: 3.5 g/dL (ref 3.5–5.0)
Alkaline Phosphatase: 245 U/L — ABNORMAL HIGH (ref 38–126)
Anion gap: 7 (ref 5–15)
BUN: 8 mg/dL (ref 8–23)
CO2: 28 mmol/L (ref 22–32)
Calcium: 8.7 mg/dL — ABNORMAL LOW (ref 8.9–10.3)
Chloride: 100 mmol/L (ref 98–111)
Creatinine, Ser: 0.72 mg/dL (ref 0.44–1.00)
GFR, Estimated: >60 mL/min (ref >60)
Glucose, Bld: 101 mg/dL — ABNORMAL HIGH (ref 70–99)
Potassium: 4 mmol/L (ref 3.5–5.1)
Sodium: 135 mmol/L (ref 135–145)
Total Bilirubin: 0.8 mg/dL (ref 0.3–1.2)
Total Protein: 6.4 g/dL — ABNORMAL LOW (ref 6.5–8.1)

## 2022-02-19 LAB — TROPONIN I (HIGH SENSITIVITY): Troponin I (High Sensitivity): 9 ng/L (ref <18)

## 2022-02-19 LAB — BRAIN NATRIURETIC PEPTIDE: B Natriuretic Peptide: 233 pg/mL — ABNORMAL HIGH (ref 0.0–100.0)

## 2022-02-19 LAB — TSH: TSH: 5.859 u[IU]/mL — ABNORMAL HIGH (ref 0.350–4.500)

## 2022-02-19 MED ORDER — SODIUM CHLORIDE 0.9 % IV SOLN
200.0000 mg | Freq: Once | INTRAVENOUS | Status: AC
  Administered 2022-02-19: 200 mg via INTRAVENOUS
  Filled 2022-02-19: qty 8

## 2022-02-19 MED ORDER — HEPARIN SOD (PORK) LOCK FLUSH 100 UNIT/ML IV SOLN
500.0000 [IU] | Freq: Once | INTRAVENOUS | Status: AC | PRN
  Administered 2022-02-19: 500 [IU]

## 2022-02-19 MED ORDER — SODIUM CHLORIDE 0.9% FLUSH
10.0000 mL | INTRAVENOUS | Status: DC | PRN
  Administered 2022-02-19: 10 mL via INTRAVENOUS

## 2022-02-19 MED ORDER — SODIUM CHLORIDE 0.9% FLUSH
10.0000 mL | INTRAVENOUS | Status: DC | PRN
  Administered 2022-02-19: 10 mL

## 2022-02-19 MED ORDER — SODIUM CHLORIDE 0.9 % IV SOLN: Freq: Once | INTRAVENOUS | Status: AC

## 2022-02-19 NOTE — Progress Notes (Unsigned)
Referral to rad Onc [place].  Records faxed on 10/11

## 2022-02-19 NOTE — Progress Notes (Signed)
Labs reviewed with MD today, ok to proceed per MD.

## 2022-02-19 NOTE — Patient Instructions (Addendum)
[facility] [facility] at [facility] Discharge Instructions   You were seen and examined today by Dr. Delton Coombes.  He reviewed the results of your PET scan. Two spots on your right femur have come back. We will continue treatment with Keytruda. We will arrange for you to have radiation treatment to those areas. We will reach out to [facility] to see if this is possible to do there.   We will proceed with treatment today.  Return as scheduled.    Thank you for choosing [place] at [facility] to provide your oncology and hematology care.  To afford each patient quality time with our provider, please arrive at least 15 minutes before your scheduled appointment time.   If you have a lab appointment with the [facility] please come in thru the Main Entrance and check in at the main information desk.  You need to re-schedule your appointment should you arrive 10 or more minutes late.  We strive to give you quality time with our providers, and arriving late affects you and other patients whose appointments are after yours.  Also, if you no show three or more times for appointments you may be dismissed from the clinic at the providers discretion.     Again, thank you for choosing [facility].  Our hope is that these requests will decrease the amount of time that you wait before being seen by our physicians.       _____________________________________________________________  Should you have questions after your visit to [facility], please contact our office at [phone] and follow the prompts.  Our office hours are 8:00 a.m. and 4:30 p.m. Monday - Friday.  Please note that voicemails left after 4:00 p.m. may not be returned until the following business day.  We are closed weekends and major holidays.  You do have access to a nurse 24-7, just call the main number to the clinic [phone] and do not press any options, hold on the line and  a nurse will answer the phone.    For prescription refill requests, have your pharmacy contact our office and allow 72 hours.    Due to Covid, you will need to wear a mask upon entering the hospital. If you do not have a mask, a mask will be given to you at the Main Entrance upon arrival. For doctor visits, patients may have 1 support person age 11 or older with them. For treatment visits, patients can not have anyone with them due to social distancing guidelines and our immunocompromised population.

## 2022-02-19 NOTE — Progress Notes (Signed)
Patients port flushed without difficulty.  Good blood return noted with no bruising or swelling noted at site.  Patient remains accessed for chemotherapy treatment.  

## 2022-02-19 NOTE — Progress Notes (Signed)
Patient presents today for Keytruda infusion per providers order.  Vital signs and labs within parameters for treatment.  Message received from Anastasio Champion RN/Dr. Delton Coombes patient okay for treatment if troponin results under 20.  Treatment given today per MD orders.  Stable during infusion without adverse affects.  Vital signs stable.  No complaints at this time.  Discharge from clinic ambulatory in stable condition.  Alert and oriented X 3.  Follow up with [facility] as scheduled.

## 2022-02-19 NOTE — Patient Instructions (Signed)
[facility]  Discharge Instructions: Thank you for choosing [facility] to provide your oncology and hematology care.  If you have a lab appointment with the Cancer Center, please come in thru the Main Entrance and check in at the main information desk.  Wear comfortable clothing and clothing appropriate for easy access to any Portacath or PICC line.   We strive to give you quality time with your provider. You may need to reschedule your appointment if you arrive late (15 or more minutes).  Arriving late affects you and other patients whose appointments are after yours.  Also, if you miss three or more appointments without notifying the office, you may be dismissed from the clinic at the provider's discretion.      For prescription refill requests, have your pharmacy contact our office and allow 72 hours for refills to be completed.    Today you received the following chemotherapy and/or immunotherapy agents Keytruda      To help prevent nausea and vomiting after your treatment, we encourage you to take your nausea medication as directed.  BELOW ARE SYMPTOMS THAT SHOULD BE REPORTED IMMEDIATELY: *FEVER GREATER THAN 100.4 F (38 C) OR HIGHER *CHILLS OR SWEATING *NAUSEA AND VOMITING THAT IS NOT CONTROLLED WITH YOUR NAUSEA MEDICATION *UNUSUAL SHORTNESS OF BREATH *UNUSUAL BRUISING OR BLEEDING *URINARY PROBLEMS (pain or burning when urinating, or frequent urination) *BOWEL PROBLEMS (unusual diarrhea, constipation, pain near the anus) TENDERNESS IN MOUTH AND THROAT WITH OR WITHOUT PRESENCE OF ULCERS (sore throat, sores in mouth, or a toothache) UNUSUAL RASH, SWELLING OR PAIN  UNUSUAL VAGINAL DISCHARGE OR ITCHING   Items with * indicate a potential emergency and should be followed up as soon as possible or go to the Emergency Department if any problems should occur.  Please show the CHEMOTHERAPY ALERT CARD or IMMUNOTHERAPY ALERT CARD at check-in to the  Emergency Department and triage nurse.  Should you have questions after your visit or need to cancel or reschedule your appointment, please contact [facility] [phone]  and follow the prompts.  Office hours are 8:00 a.m. to 4:30 p.m. Monday - Friday. Please note that voicemails left after 4:00 p.m. may not be returned until the following business day.  We are closed weekends and major holidays. You have access to a nurse at all times for urgent questions. Please call the main number to the clinic [phone] and follow the prompts.  For any non-urgent questions, you may also contact your provider using MyChart. We now offer e-Visits for anyone 18 and older to request care online for non-urgent symptoms. For details visit [email or web address].   Also download the MyChart app! Go to the app store, search "MyChart", open the app, select [place], and log in with your MyChart username and password.  Masks are optional in the cancer centers. If you would like for your care team to wear a mask while they are taking care of you, please let them know. You may have one support person who is at least 81 years old accompany you for your appointments.  

## 2022-02-21 ENCOUNTER — Encounter: Payer: Self-pay | Admitting: Hematology

## 2022-02-21 ENCOUNTER — Encounter (HOSPITAL_COMMUNITY): Payer: Self-pay | Admitting: Hematology

## 2022-02-21 NOTE — Progress Notes (Signed)
[place], [place] [address]   CLINIC:  Medical Oncology/Hematology  PCP:  Neale Burly, MD [place] / [place] [address] [phone]   REASON FOR VISIT:  Follow-up for metastatic malignant melanoma, BRAF V600 negative  PRIOR THERAPY: none  NGS Results: BRAF negative, positive for NRAS pathogenic variant exon 3, TERT promoter.  MSI-stable.  MMR-proficient.  [place] 1/2/3 fusion not detected.  KIT mutation negative.  PD-L1 negative.  CURRENT THERAPY: Pembrolizumab (200) q21d Keytruda  BRIEF ONCOLOGIC HISTORY:  Oncology History  Malignant melanoma of lower leg, right ([place])  03/27/2021 Initial Diagnosis   Malignant melanoma of lower leg, right ([place])   05/09/2021 - 05/09/2021 Chemotherapy   Patient is on Treatment Plan : MELANOMA - [place] (nivolumab/relatlimab) q28d     09/03/2021 - 01/07/2022 Chemotherapy   Patient is on Treatment Plan : MELANOMA Pembrolizumab (200) q21d     09/03/2021 -  Chemotherapy   Patient is on Treatment Plan : MELANOMA Pembrolizumab (200) q21d       CANCER STAGING:  Cancer Staging  Malignant melanoma of lower leg, right ([place]) Staging form: Melanoma of the Skin, AJCC 8th Edition - Clinical stage from 03/27/2021: Stage IV (cT4a, cN3, cM1) - Unsigned   INTERVAL HISTORY:  [name], a 81 y.o. female, seen for follow-up of metastatic melanoma.  She is tolerating pembrolizumab very well.  Energy levels are 80%.  Denies any GI symptoms.  Denies any chest pains or arm pains.  REVIEW OF SYSTEMS:  Review of Systems  Constitutional:  Negative for appetite change and fatigue.  Psychiatric/Behavioral:  Positive for depression.   All other systems reviewed and are negative.   PAST MEDICAL/SURGICAL HISTORY:  Past Medical History:  Diagnosis Date   Arthritis    Hypertension    Malignant melanoma of lower leg, right ([place]) 03/27/2021   Port-A-Cath in place 04/30/2021   Past Surgical History:   Procedure Laterality Date   CATARACT EXTRACTION W/PHACO Right 10/03/2014   Procedure: CATARACT EXTRACTION PHACO AND INTRAOCULAR LENS PLACEMENT ([place]);  Surgeon: [name], MD;  Location: [facility];  Service: Ophthalmology;  Laterality: Right;  CDE:10.09   CATARACT EXTRACTION W/PHACO Left 10/10/2014   Procedure: CATARACT EXTRACTION PHACO AND INTRAOCULAR LENS PLACEMENT ([facility]);  Surgeon: [name], MD;  Location: [facility];  Service: Ophthalmology;  Laterality: Left;  CDE:8.69   CORNEAL 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   CORNEAL TRANSPLANT Left 2020   DENTAL RESTORATION/EXTRACTION WITH X-RAY     IR IMAGING GUIDED PORT INSERTION  04/05/2021   LEFT HEART CATH AND CORONARY ANGIOGRAPHY N/A 05/28/2021   Procedure: LEFT HEART CATH AND CORONARY ANGIOGRAPHY;  Surgeon: Nigel Mormon, MD;  Location: [facility];  Service: Cardiovascular;  Laterality: N/A;   MELANOMA EXCISION Right 02/12/2021   Procedure: WIDE LOCAL EXCISION RIGHT LOWER LEG MELANOMA , ADVANCEMENT FLAP CLOSURE DEFECT;  Surgeon: [name], MD;  Location: [facility];  Service: General;  Laterality: Right;   SENTINEL NODE BIOPSY Right 02/12/2021   Procedure: SENTINEL NODE BIOPSY;  Surgeon: [name], MD;  Location: [facility];  Service: General;  Laterality: Right;    SOCIAL HISTORY:  Social History   Socioeconomic History   Marital status: Widowed    Spouse name: Not on file   Number of children: 3   Years of education: Not on file   Highest education level: Not on file  Occupational History   Not on file  Tobacco Use  Smoking status: Never   Smokeless tobacco: Never  Vaping Use   Vaping Use: Never used  Substance and Sexual Activity   Alcohol use: No   Drug use: No   Sexual activity: Not on file  Other Topics Concern   Not on file  Social History Narrative   Not on file   Social Determinants of Health   Financial Resource Strain: Not on file  Food Insecurity: Not on file  Transportation Needs: Not on file  Physical  Activity: Not on file  Stress: Not on file  Social Connections: Not on file  Intimate Partner Violence: Not on file    FAMILY HISTORY:  Family History  Problem Relation Age of Onset   Cancer Father    Hypertension Sister    Hypertension Sister     CURRENT MEDICATIONS:  Current Outpatient Medications  Medication Sig Dispense Refill   alendronate (FOSAMAX) 70 MG tablet Take 70 mg by mouth once a week.  0   ALPRAZolam (XANAX) 0.5 MG tablet Take 0.5 mg by mouth 3 (three) times daily.  2   aspirin EC 81 MG EC tablet Take 1 tablet (81 mg total) by mouth daily. Swallow whole. 30 tablet 11   calcium carbonate (OS-CAL - DOSED IN MG OF ELEMENTAL CALCIUM) 1250 (500 CA) MG tablet Take 1 tablet by mouth 3 (three) times a week.     cholecalciferol (VITAMIN D) 1000 UNITS tablet Take 1,000 Units by mouth 3 (three) times a week.     Cyanocobalamin (VITAMIN B-12 PO) Take 1 tablet by mouth 3 (three) times a week.     levothyroxine (SYNTHROID) 100 MCG tablet Take 1 tablet by mouth daily before breakfast. 30 tablet 3   nystatin (MYCOSTATIN) 100000 UNIT/ML suspension Take 10 mLs (1,000,000 Units total) by mouth 4 (four) times daily. Swish and spit 480 mL 1   prednisoLONE acetate (PRED FORTE) 1 % ophthalmic suspension Place 1 drop into both eyes See admin instructions. Instill one drop into the right eye twice daily and one drop into the left eye once daily.     atorvastatin (LIPITOR) 40 MG tablet Take 1 tablet (40 mg total) by mouth daily. 30 tablet 0   metoprolol tartrate (LOPRESSOR) 25 MG tablet Take 1 tablet (25 mg total) by mouth 2 (two) times daily. 60 tablet 0   No current facility-administered medications for this visit.    ALLERGIES:  Allergies  Allergen Reactions   Tape     Pulls skin, please use paper tape    PHYSICAL EXAM:  Performance status (ECOG): 0 - Asymptomatic  There were no vitals filed for this visit.   Wt Readings from Last 3 Encounters:  01/29/22 177 lb 0.5 oz (80.3 kg)   01/07/22 179 lb 0.2 oz (81.2 kg)  12/18/21 174 lb (78.9 kg)   Physical Exam Vitals reviewed.  Constitutional:      Appearance: Normal appearance.  Cardiovascular:     Rate and Rhythm: Normal rate and regular rhythm.     Pulses: Normal pulses.     Heart sounds: Normal heart sounds.  Pulmonary:     Breath sounds: Normal breath sounds.  Neurological:     General: No focal deficit present.     Mental Status: She is alert and oriented to person, place, and time.  Psychiatric:        Mood and Affect: Mood normal.        Behavior: Behavior normal.      LABORATORY DATA:  I have  reviewed the labs as listed.     Latest Ref Rng & Units 02/19/2022    9:39 AM 01/29/2022   10:02 AM 01/07/2022   11:39 AM  CBC  WBC 4.0 - 10.5 K/uL 5.0  5.9  5.5   Hemoglobin 12.0 - 15.0 g/dL 12.1  12.4  11.8   Hematocrit 36.0 - 46.0 % 36.7  37.5  36.5   Platelets 150 - 400 K/uL 175  154  150       Latest Ref Rng & Units 02/19/2022    9:39 AM 01/29/2022   10:02 AM 01/07/2022   11:39 AM  CMP  Glucose 70 - 99 mg/dL 101  126  110   BUN 8 - 23 mg/dL _0 Creatinine 0.44 - 1.00 mg/dL 0.72  0.74  0.80   Sodium 135 - 145 mmol/L 135  138  137   Potassium 3.5 - 5.1 mmol/L 4.0  3.9  4.4   Chloride 98 - 111 mmol/L 100  103  103   CO2 22 - 32 mmol/L _1 Calcium 8.9 - 10.3 mg/dL 8.7  8.7  8.9   Total Protein 6.5 - 8.1 g/dL 6.4  6.5  6.4   Total Bilirubin 0.3 - 1.2 mg/dL 0.8  0.8  1.1   Alkaline Phos 38 - 126 U/L 245  202  161   AST 15 - 41 U/L 63  59  44   ALT 0 - 44 U/L 47  47  43     DIAGNOSTIC IMAGING:  I have independently reviewed the scans and discussed with the patient. NM PET Image Restage (PS) Whole Body  Result Date: 02/16/2022 CLINICAL DATA:  Subsequent treatment strategy for melanoma. EXAM: NUCLEAR MEDICINE PET WHOLE BODY TECHNIQUE: 9.23 mCi F-18 FDG was injected intravenously. Full-ring PET imaging was performed from the head to foot after the radiotracer. CT data was obtained  and used for attenuation correction and anatomic localization. Fasting blood glucose: 116 mg/dl COMPARISON:  11/14/2021 FINDINGS: Mediastinal blood pool activity: SUV max 3.0 HEAD/NECK: Tracer avid nodule within the deep right parotid gland is again noted measuring 8 mm with SUV max 5.07, image 63/3. Previously this measured 8 mm with SUV max of 4.9. Incidental CT findings: none CHEST: No hypermetabolic mediastinal or hilar nodes. No suspicious pulmonary nodules on the CT scan. Incidental CT findings: Aortic atherosclerosis and coronary artery calcifications. ABDOMEN/PELVIS: No abnormal hypermetabolic activity within the liver, pancreas, adrenal glands, or spleen. No hypermetabolic lymph nodes in the abdomen or pelvis. Within the presacral soft tissue space there is a predominantly fat attenuating mass with internal soft tissue components measuring 6.5 x 6.2 cm. There is mild tracer uptake localizing to the soft tissue components with SUV max of 2.49, image 249/3. On the previous exam this was measured at 6.9 x 5.6 cm with SUV max of 2.3. Incidental CT findings: Aortic atherosclerosis.  Gallstones. SKELETON: No focal hypermetabolic activity to suggest skeletal metastasis. Incidental CT findings: none EXTREMITIES: There is a tracer avid soft tissue nodule within the medial right thigh measuring 1.4 x 1.2 cm with SUV max of 5.95, image 322/3. On the previous exam this measured 1 cm with SUV max of 2.0. Interval recurrence of FDG avid lesion within the mid shaft of the right femur. Here, a focal area of increased uptake currently measures 0.8 cm with SUV max of 8.21, image 354/3. Similar mild tracer uptake within the area of postsurgical change  in the medial right calf. SUV max is equal to 2.04 on today's study, image 479/3. Previously 2.2. Incidental CT findings: none IMPRESSION: 1. Interval recurrence of FDG avid lesion within the mid shaft of the right femur. 2. Interval increase in size and degree of FDG uptake  associated with soft tissue nodule within the medial right thigh. 3. Similar appearance of tracer avid nodule within the deep right parotid gland. This is favored to represent a primary parotid neoplasm. Consider referral to ENT for further management. 4. Similar appearance of fat and soft tissue attenuating mass within the presacral soft tissue space with mild low level FDG uptake. 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 5.  Aortic Atherosclerosis (ICD10-I70.0). Aortic Atherosclerosis (ICD10-I70.0). Coronary artery calcifications. Electronically Signed   By: Kerby Moors M.D.   On: 02/16/2022 14:23     ASSESSMENT:  Malignant melanoma of right posterior leg (pT4 pN3 M1): - She noticed fleshy lesion 4 months ago on the right leg. - Her PMD did a biopsy which was consistent with melanoma. - Wide local excision and lymph node biopsy by Dr. Barry Dienes on 02/12/2021. - Pathology margins free, nodular type, Breslow's thickness 9.52 mm, Clark level V, deep margins free, ulceration absent, satellitosis absent, mitotic index 2/millimeters squared, LVI negative.  Neurotropism absent.  Tumor infiltrating lymphocytes absent.  Tumor regression not noted.  3 out of 3 positive sentinel lymph nodes for melanoma. - PET CT scan on 03/22/2021 with 3 foci of intense radiotracer activity within the right lower extremity.  In the medial right upper thigh nodule just superficial to thigh musculature measuring 11 mm with SUV 7.6.  Intramedullary hypermetabolic activity within the shaft of the mid right femur with SUV 14.6.  Intense activity associated with condylar notch of the distal right femur, appears to localize to soft tissue rather than the bone.  More diffuse activity associated with the medial right calf related to excision biopsy.  There is a focus of activity adjacent to the lateral margin of the right iliac wing SUV 4.1.  No evidence of visceral metastatic disease in the chest,  abdomen or pelvis. 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 - NGS testing-BRAF negative, positive for NRAS pathogenic variant exon 3, TERT promoter.  MSI-stable.  MMR-proficient.  [place] 1/2/3 fusion not detected.  KIT mutation negative.  PD-L1 negative. - Nivolumab-Relatlimab every 28 days started on 05/09/2021.  Held permanently after first dose due to myocarditis. - PET scan on 07/25/2021: Slight progression with no new sites of metastatic disease. - Pembrolizumab started on 09/03/2021    Social/family history: - She is seen with her son today.  She lives by herself at home.  She is independent of ADLs and IADLs.  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  She is non-smoker. - Father had colon cancer.  Sister had breast cancer and colon cancer.  Maternal aunt had breast cancer.  Paternal uncle had colon cancer.   PLAN:  Malignant melanoma of the right posterior leg (pT4 pN3 M1), BRAF V600 negative: - PET scan (02/13/2022): There is soft tissue nodule within the medial right thigh measuring 1.4 x 1.2 cm, SUV 5.95.  Previous exam this measured 1 cm with SUV 2.0.  FDG avid midshaft right femur lesion, 0.8 cm with SUV 8.21. - There are  2 areas of recurrence on the current scan. - I have talked to Dr. Lynnette Caffey for radiation of these lesions.  She will be seen in the next few days.  I have reviewed labs today which showed elevated AST and ALT are stable.  Rest of LFTs are normal.  CBC was normal.  She will proceed with next cycle of Keytruda.  RTC 3 weeks for follow-up.  2.  History of immunotherapy myocarditis: - She does not have any symptoms of chest pain.  Troponin is normal at 9 today.  BNP is 233.  3.  Right medial thigh pain: - She does not report any pain but has some soreness.  4.   Hypothyroidism: - Continue Synthroid 100 mcg daily.  TSH today is 5.8.  We will closely monitor it next time.   Orders placed this encounter:  No orders of the defined types were placed in this encounter.    Derek Jack, MD [place] [phone]

## 2022-02-24 DIAGNOSIS — C7952 Secondary malignant neoplasm of bone marrow: Secondary | ICD-10-CM | POA: Diagnosis not present

## 2022-02-24 DIAGNOSIS — C439 Malignant melanoma of skin, unspecified: Secondary | ICD-10-CM | POA: Diagnosis not present

## 2022-02-24 DIAGNOSIS — C7951 Secondary malignant neoplasm of bone: Secondary | ICD-10-CM | POA: Diagnosis not present

## 2022-02-24 DIAGNOSIS — C7989 Secondary malignant neoplasm of other specified sites: Secondary | ICD-10-CM | POA: Diagnosis not present

## 2022-02-24 DIAGNOSIS — C4371 Malignant melanoma of right lower limb, including hip: Secondary | ICD-10-CM | POA: Diagnosis not present

## 2022-03-04 DIAGNOSIS — C4371 Malignant melanoma of right lower limb, including hip: Secondary | ICD-10-CM | POA: Diagnosis not present

## 2022-03-04 DIAGNOSIS — C7951 Secondary malignant neoplasm of bone: Secondary | ICD-10-CM | POA: Diagnosis not present

## 2022-03-04 DIAGNOSIS — C7989 Secondary malignant neoplasm of other specified sites: Secondary | ICD-10-CM | POA: Diagnosis not present

## 2022-03-04 DIAGNOSIS — R2241 Localized swelling, mass and lump, right lower limb: Secondary | ICD-10-CM | POA: Diagnosis not present

## 2022-03-04 DIAGNOSIS — C4021 Malignant neoplasm of long bones of right lower limb: Secondary | ICD-10-CM | POA: Diagnosis not present

## 2022-03-04 DIAGNOSIS — C439 Malignant melanoma of skin, unspecified: Secondary | ICD-10-CM | POA: Diagnosis not present

## 2022-03-07 DIAGNOSIS — C7989 Secondary malignant neoplasm of other specified sites: Secondary | ICD-10-CM | POA: Diagnosis not present

## 2022-03-07 DIAGNOSIS — C7951 Secondary malignant neoplasm of bone: Secondary | ICD-10-CM | POA: Diagnosis not present

## 2022-03-07 DIAGNOSIS — C439 Malignant melanoma of skin, unspecified: Secondary | ICD-10-CM | POA: Diagnosis not present

## 2022-03-07 DIAGNOSIS — C4371 Malignant melanoma of right lower limb, including hip: Secondary | ICD-10-CM | POA: Diagnosis not present

## 2022-03-07 DIAGNOSIS — C7952 Secondary malignant neoplasm of bone marrow: Secondary | ICD-10-CM | POA: Diagnosis not present

## 2022-03-12 ENCOUNTER — Inpatient Hospital Stay: Payer: Medicare Other | Attending: Hematology

## 2022-03-12 ENCOUNTER — Inpatient Hospital Stay (HOSPITAL_BASED_OUTPATIENT_CLINIC_OR_DEPARTMENT_OTHER): Payer: Medicare Other | Admitting: Hematology

## 2022-03-12 ENCOUNTER — Inpatient Hospital Stay: Payer: Medicare Other

## 2022-03-12 VITALS — BP 112/49 | HR 69 | Temp 97.5°F | Resp 18

## 2022-03-12 DIAGNOSIS — Z800 Family history of malignant neoplasm of digestive organs: Secondary | ICD-10-CM | POA: Insufficient documentation

## 2022-03-12 DIAGNOSIS — Z95828 Presence of other vascular implants and grafts: Secondary | ICD-10-CM

## 2022-03-12 DIAGNOSIS — R2241 Localized swelling, mass and lump, right lower limb: Secondary | ICD-10-CM | POA: Diagnosis not present

## 2022-03-12 DIAGNOSIS — C4371 Malignant melanoma of right lower limb, including hip: Secondary | ICD-10-CM

## 2022-03-12 DIAGNOSIS — Z803 Family history of malignant neoplasm of breast: Secondary | ICD-10-CM | POA: Insufficient documentation

## 2022-03-12 DIAGNOSIS — I119 Hypertensive heart disease without heart failure: Secondary | ICD-10-CM | POA: Diagnosis not present

## 2022-03-12 DIAGNOSIS — C7952 Secondary malignant neoplasm of bone marrow: Secondary | ICD-10-CM | POA: Diagnosis not present

## 2022-03-12 DIAGNOSIS — Z79899 Other long term (current) drug therapy: Secondary | ICD-10-CM | POA: Diagnosis not present

## 2022-03-12 DIAGNOSIS — M79651 Pain in right thigh: Secondary | ICD-10-CM | POA: Diagnosis not present

## 2022-03-12 DIAGNOSIS — Z5112 Encounter for antineoplastic immunotherapy: Secondary | ICD-10-CM | POA: Insufficient documentation

## 2022-03-12 DIAGNOSIS — C7951 Secondary malignant neoplasm of bone: Secondary | ICD-10-CM | POA: Insufficient documentation

## 2022-03-12 DIAGNOSIS — C7989 Secondary malignant neoplasm of other specified sites: Secondary | ICD-10-CM | POA: Diagnosis not present

## 2022-03-12 DIAGNOSIS — C4370 Malignant melanoma of unspecified lower limb, including hip: Secondary | ICD-10-CM | POA: Diagnosis not present

## 2022-03-12 DIAGNOSIS — C439 Malignant melanoma of skin, unspecified: Secondary | ICD-10-CM | POA: Diagnosis not present

## 2022-03-12 DIAGNOSIS — E039 Hypothyroidism, unspecified: Secondary | ICD-10-CM | POA: Insufficient documentation

## 2022-03-12 LAB — CBC WITH DIFFERENTIAL/PLATELET
Abs Immature Granulocytes: 0.01 10*3/uL (ref 0.00–0.07)
Basophils Absolute: 0.1 10*3/uL (ref 0.0–0.1)
Basophils Relative: 1 %
Eosinophils Absolute: 0.1 10*3/uL (ref 0.0–0.5)
Eosinophils Relative: 2 %
HCT: 37.6 % (ref 36.0–46.0)
Hemoglobin: 12.4 g/dL (ref 12.0–15.0)
Immature Granulocytes: 0 %
Lymphocytes Relative: 19 %
Lymphs Abs: 1 10*3/uL (ref 0.7–4.0)
MCH: 29.7 pg (ref 26.0–34.0)
MCHC: 33 g/dL (ref 30.0–36.0)
MCV: 90.2 fL (ref 80.0–100.0)
Monocytes Absolute: 0.3 10*3/uL (ref 0.1–1.0)
Monocytes Relative: 7 %
Neutro Abs: 3.5 10*3/uL (ref 1.7–7.7)
Neutrophils Relative %: 71 %
Platelets: 165 10*3/uL (ref 150–400)
RBC: 4.17 MIL/uL (ref 3.87–5.11)
RDW: 13.1 % (ref 11.5–15.5)
WBC: 5 10*3/uL (ref 4.0–10.5)
nRBC: 0 % (ref 0.0–0.2)

## 2022-03-12 LAB — COMPREHENSIVE METABOLIC PANEL
ALT: 82 U/L — ABNORMAL HIGH (ref 0–44)
AST: 148 U/L — ABNORMAL HIGH (ref 15–41)
Albumin: 3.5 g/dL (ref 3.5–5.0)
Alkaline Phosphatase: 310 U/L — ABNORMAL HIGH (ref 38–126)
Anion gap: 6 (ref 5–15)
BUN: 10 mg/dL (ref 8–23)
CO2: 29 mmol/L (ref 22–32)
Calcium: 8.7 mg/dL — ABNORMAL LOW (ref 8.9–10.3)
Chloride: 99 mmol/L (ref 98–111)
Creatinine, Ser: 0.75 mg/dL (ref 0.44–1.00)
GFR, Estimated: >60 mL/min (ref >60)
Glucose, Bld: 113 mg/dL — ABNORMAL HIGH (ref 70–99)
Potassium: 4 mmol/L (ref 3.5–5.1)
Sodium: 134 mmol/L — ABNORMAL LOW (ref 135–145)
Total Bilirubin: 1.4 mg/dL — ABNORMAL HIGH (ref 0.3–1.2)
Total Protein: 6.9 g/dL (ref 6.5–8.1)

## 2022-03-12 LAB — TROPONIN I (HIGH SENSITIVITY): Troponin I (High Sensitivity): 12 ng/L (ref <18)

## 2022-03-12 LAB — TSH: TSH: 2.885 u[IU]/mL (ref 0.350–4.500)

## 2022-03-12 MED ORDER — SODIUM CHLORIDE 0.9 % IV SOLN
200.0000 mg | Freq: Once | INTRAVENOUS | Status: AC
  Administered 2022-03-12: 200 mg via INTRAVENOUS
  Filled 2022-03-12: qty 8

## 2022-03-12 MED ORDER — SODIUM CHLORIDE 0.9% FLUSH
10.0000 mL | INTRAVENOUS | Status: DC | PRN
  Administered 2022-03-12: 10 mL

## 2022-03-12 MED ORDER — HEPARIN SOD (PORK) LOCK FLUSH 100 UNIT/ML IV SOLN
500.0000 [IU] | Freq: Once | INTRAVENOUS | Status: AC | PRN
  Administered 2022-03-12: 500 [IU]

## 2022-03-12 MED ORDER — SODIUM CHLORIDE 0.9 % IV SOLN: Freq: Once | INTRAVENOUS | Status: AC

## 2022-03-12 NOTE — Progress Notes (Signed)
Pt presents today for Keytruda per provider's order. Vital signs and other labs WNL for treatment. Pt's AST is 148 and ALT is 82 today. MD made aware. Okay to proceed with treatment today per Dr.K  Beryle Flock given today per MD orders. Tolerated infusion without adverse affects. Vital signs stable. No complaints at this time. Discharged from clinic ambulatory in stable condition. Alert and oriented x 3. F/U with [facility] as scheduled.

## 2022-03-12 NOTE — Patient Instructions (Addendum)
[place] at [facility] Discharge Instructions   You were seen and examined today by Dr. Delton Coombes.  He reviewed partial results of your blood work which is normal/stable.  We will proceed with your treatment today.   Return as scheduled in 4 weeks.    Thank you for choosing [place] at [facility] to provide your oncology and hematology care.  To afford each patient quality time with our provider, please arrive at least 15 minutes before your scheduled appointment time.   If you have a lab appointment with the [facility] please come in thru the Main Entrance and check in at the main information desk.  You need to re-schedule your appointment should you arrive 10 or more minutes late.  We strive to give you quality time with our providers, and arriving late affects you and other patients whose appointments are after yours.  Also, if you no show three or more times for appointments you may be dismissed from the clinic at the providers discretion.     Again, thank you for choosing [facility].  Our hope is that these requests will decrease the amount of time that you wait before being seen by our physicians.       _____________________________________________________________  Should you have questions after your visit to [facility], please contact our office at [phone] and follow the prompts.  Our office hours are 8:00 a.m. and 4:30 p.m. Monday - Friday.  Please note that voicemails left after 4:00 p.m. may not be returned until the following business day.  We are closed weekends and major holidays.  You do have access to a nurse 24-7, just call the main number to the clinic [phone] and do not press any options, hold on the line and a nurse will answer the phone.    For prescription refill requests, have your pharmacy contact our office and allow 72 hours.    Due to Covid, you will need to wear a mask  upon entering the hospital. If you do not have a mask, a mask will be given to you at the Main Entrance upon arrival. For doctor visits, patients may have 1 support person age 63 or older with them. For treatment visits, patients can not have anyone with them due to social distancing guidelines and our immunocompromised population.

## 2022-03-12 NOTE — Patient Instructions (Signed)
[facility]  Discharge Instructions: Thank you for choosing [facility] to provide your oncology and hematology care.  If you have a lab appointment with the [facility], please come in thru the Main Entrance and check in at the main information desk.  Wear comfortable clothing and clothing appropriate for easy access to any Portacath or PICC line.   We strive to give you quality time with your provider. You may need to reschedule your appointment if you arrive late (15 or more minutes).  Arriving late affects you and other patients whose appointments are after yours.  Also, if you miss three or more appointments without notifying the office, you may be dismissed from the clinic at the provider's discretion.      For prescription refill requests, have your pharmacy contact our office and allow 72 hours for refills to be completed.    Today you received the following chemotherapy and/or immunotherapy agents Keytruda  Pembrolizumab Injection What is this medication? PEMBROLIZUMAB (PEM broe LIZ ue mab) treats some types of cancer. It works by helping your immune system slow or stop the spread of cancer cells. It is a monoclonal antibody. This medicine may be used for other purposes; ask your health care provider or pharmacist if you have questions. COMMON BRAND NAME(S): Keytruda What should I tell my care team before I take this medication? They need to know if you have any of these conditions: Allogeneic stem cell transplant (uses someone else's stem cells) Autoimmune diseases, such as Crohn disease, ulcerative colitis, lupus History of chest radiation Nervous system problems, such as Guillain-Barre syndrome, myasthenia gravis Organ transplant An unusual or allergic reaction to pembrolizumab, other medications, foods, dyes, or preservatives Pregnant or trying to get pregnant Breast-feeding How should I use this medication? This medication is injected  into a vein. It is given by your care team in a hospital or clinic setting. A special MedGuide will be given to you before each treatment. Be sure to read this information carefully each time. Talk to your care team about the use of this medication in children. While it may be prescribed for children as young as 6 months for selected conditions, precautions do apply. Overdosage: If you think you have taken too much of this medicine contact a poison control center or emergency room at once. NOTE: This medicine is only for you. Do not share this medicine with others. What if I miss a dose? Keep appointments for follow-up doses. It is important not to miss your dose. Call your care team if you are unable to keep an appointment. What may interact with this medication? Interactions have not been studied. This list may not describe all possible interactions. Give your health care provider a list of all the medicines, herbs, non-prescription drugs, or dietary supplements you use. Also tell them if you smoke, drink alcohol, or use illegal drugs. Some items may interact with your medicine. What should I watch for while using this medication? Your condition will be monitored carefully while you are receiving this medication. You may need blood work while taking this medication. This medication may cause serious skin reactions. They can happen weeks to months after starting the medication. Contact your care team right away if you notice fevers or flu-like symptoms with a rash. The rash may be red or purple and then turn into blisters or peeling of the skin. You may also notice a red rash with swelling of the face, lips, or lymph  nodes in your neck or under your arms. Tell your care team right away if you have any change in your eyesight. Talk to your care team if you may be pregnant. Serious birth defects can occur if you take this medication during pregnancy and for 4 months after the last dose. You will need a  negative pregnancy test before starting this medication. Contraception is recommended while taking this medication and for 4 months after the last dose. Your care team can help you find the option that works for you. Do not breastfeed while taking this medication and for 4 months after the last dose. What side effects may I notice from receiving this medication? Side effects that you should report to your care team as soon as possible: Allergic reactions--skin rash, itching, hives, swelling of the face, lips, tongue, or throat Dry cough, shortness of breath or trouble breathing Eye pain, redness, irritation, or discharge with blurry or decreased vision Heart muscle inflammation--unusual weakness or fatigue, shortness of breath, chest pain, fast or irregular heartbeat, dizziness, swelling of the ankles, feet, or hands Hormone gland problems--headache, sensitivity to light, unusual weakness or fatigue, dizziness, fast or irregular heartbeat, increased sensitivity to cold or heat, excessive sweating, constipation, hair loss, increased thirst or amount of urine, tremors or shaking, irritability Infusion reactions--chest pain, shortness of breath or trouble breathing, feeling faint or lightheaded Kidney injury (glomerulonephritis)--decrease in the amount of urine, red or dark brown urine, foamy or bubbly urine, swelling of the ankles, hands, or feet Liver injury--right upper belly pain, loss of appetite, nausea, light-colored stool, dark yellow or brown urine, yellowing skin or eyes, unusual weakness or fatigue Pain, tingling, or numbness in the hands or feet, muscle weakness, change in vision, confusion or trouble speaking, loss of balance or coordination, trouble walking, seizures Rash, fever, and swollen lymph nodes Redness, blistering, peeling, or loosening of the skin, including inside the mouth Sudden or severe stomach pain, bloody diarrhea, fever, nausea, vomiting Side effects that usually do not  require medical attention (report to your care team if they continue or are bothersome): Bone, joint, or muscle pain Diarrhea Fatigue Loss of appetite Nausea Skin rash This list may not describe all possible side effects. Call your doctor for medical advice about side effects. You may report side effects to FDA at 1-800-FDA-1088. Where should I keep my medication? This medication is given in a hospital or clinic. It will not be stored at home. NOTE: This sheet is a summary. It may not cover all possible information. If you have questions about this medicine, talk to your doctor, pharmacist, or health care provider.  2023 Elsevier/Gold Standard (2013-01-17 00:00:00)      BELOW ARE SYMPTOMS THAT SHOULD BE REPORTED IMMEDIATELY: *FEVER GREATER THAN 100.4 F (38 C) OR HIGHER *CHILLS OR SWEATING *NAUSEA AND VOMITING THAT IS NOT CONTROLLED WITH YOUR NAUSEA MEDICATION *UNUSUAL SHORTNESS OF BREATH *UNUSUAL BRUISING OR BLEEDING *URINARY PROBLEMS (pain or burning when urinating, or frequent urination) *BOWEL PROBLEMS (unusual diarrhea, constipation, pain near the anus) TENDERNESS IN MOUTH AND THROAT WITH OR WITHOUT PRESENCE OF ULCERS (sore throat, sores in mouth, or a toothache) UNUSUAL RASH, SWELLING OR PAIN  UNUSUAL VAGINAL DISCHARGE OR ITCHING   Items with * indicate a potential emergency and should be followed up as soon as possible or go to the Emergency Department if any problems should occur.  Please show the CHEMOTHERAPY ALERT CARD or IMMUNOTHERAPY ALERT CARD at check-in to the Emergency Department and triage nurse.  Should you  have questions after your visit or need to cancel or reschedule your appointment, please contact [facility] [phone]  and follow the prompts.  Office hours are 8:00 a.m. to 4:30 p.m. Monday - Friday. Please note that voicemails left after 4:00 p.m. may not be returned until the following business day.  We are closed weekends and major  holidays. You have access to a nurse at all times for urgent questions. Please call the main number to the clinic [phone] and follow the prompts.  For any non-urgent questions, you may also contact your provider using MyChart. We now offer e-Visits for anyone 88 and older to request care online for non-urgent symptoms. For details visit [email or web address].   Also download the MyChart app! Go to the app store, search "MyChart", open the app, select [facility], and log in with your MyChart username and password.  Masks are optional in the cancer centers. If you would like for your care team to wear a mask while they are taking care of you, please let them know. You may have one support person who is at least 81 years old accompany you for your appointments.

## 2022-03-12 NOTE — Progress Notes (Signed)
[address], [place] [id number]   CLINIC:  Medical Oncology/Hematology  PCP:  Neale Burly, MD [place] / [place] [address] [phone]   REASON FOR VISIT:  Follow-up for metastatic malignant melanoma, BRAF V600 negative  PRIOR THERAPY: none  NGS Results: BRAF negative, positive for NRAS pathogenic variant exon 3, TERT promoter.  MSI-stable.  MMR-proficient.  Aromas 1/2/3 fusion not detected.  KIT mutation negative.  PD-L1 negative.  CURRENT THERAPY: Pembrolizumab (200) q21d Keytruda  BRIEF ONCOLOGIC HISTORY:  Oncology History  Malignant melanoma of lower leg, right ([facility])  03/27/2021 Initial Diagnosis   Malignant melanoma of lower leg, right ([place] [facility])   05/09/2021 - 05/09/2021 Chemotherapy   Patient is on Treatment Plan : MELANOMA - [place] (nivolumab/relatlimab) q28d     09/03/2021 - 01/07/2022 Chemotherapy   Patient is on Treatment Plan : MELANOMA Pembrolizumab (200) q21d     09/03/2021 -  Chemotherapy   Patient is on Treatment Plan : MELANOMA Pembrolizumab (200) q21d       CANCER STAGING:  Cancer Staging  Malignant melanoma of lower leg, right ([facility]) Staging form: Melanoma of the Skin, AJCC 8th Edition - Clinical stage from 03/27/2021: Stage IV (cT4a, cN3, cM1) - Unsigned   INTERVAL HISTORY:  Ms. CHELLY DOMBECK, a 81 y.o. female, seen for follow-up of her metastatic melanoma.  Denies any immunotherapy related side effects.  Energy levels are 100%.  She was evaluated by Dr. Lynnette Caffey in [place].  REVIEW OF SYSTEMS:  Review of Systems  Constitutional:  Negative for appetite change and fatigue.  Psychiatric/Behavioral:  The patient is nervous/anxious.   All other systems reviewed and are negative.   PAST MEDICAL/SURGICAL HISTORY:  Past Medical History:  Diagnosis Date   Arthritis    Hypertension    Malignant melanoma of lower leg, right ([facility]) 03/27/2021   Port-A-Cath in place 04/30/2021   Past Surgical History:   Procedure Laterality Date   CATARACT EXTRACTION W/PHACO Right 10/03/2014   Procedure: CATARACT EXTRACTION PHACO AND INTRAOCULAR LENS PLACEMENT ([facility]);  Surgeon: [name], MD;  Location: [facility];  Service: Ophthalmology;  Laterality: Right;  CDE:10.09   CATARACT EXTRACTION W/PHACO Left 10/10/2014   Procedure: CATARACT EXTRACTION PHACO AND INTRAOCULAR LENS PLACEMENT ([facility]);  Surgeon: [name], MD;  Location: [facility];  Service: Ophthalmology;  Laterality: Left;  CDE:8.69   CORNEAL 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   CORNEAL TRANSPLANT Left 2020   DENTAL RESTORATION/EXTRACTION WITH X-RAY     IR IMAGING GUIDED PORT INSERTION  04/05/2021   LEFT HEART CATH AND CORONARY ANGIOGRAPHY N/A 05/28/2021   Procedure: LEFT HEART CATH AND CORONARY ANGIOGRAPHY;  Surgeon: Nigel Mormon, MD;  Location: [facility];  Service: Cardiovascular;  Laterality: N/A;   MELANOMA EXCISION Right 02/12/2021   Procedure: WIDE LOCAL EXCISION RIGHT LOWER LEG MELANOMA , ADVANCEMENT FLAP CLOSURE DEFECT;  Surgeon: [name], MD;  Location: [facility];  Service: General;  Laterality: Right;   SENTINEL NODE BIOPSY Right 02/12/2021   Procedure: SENTINEL NODE BIOPSY;  Surgeon: [name], MD;  Location: [facility];  Service: General;  Laterality: Right;    SOCIAL HISTORY:  Social History   Socioeconomic History   Marital status: Widowed    Spouse name: Not on file   Number of children: 3   Years of education: Not on file   Highest education level: Not on file  Occupational History   Not on file  Tobacco Use   Smoking status:  Never   Smokeless tobacco: Never  Vaping Use   Vaping Use: Never used  Substance and Sexual Activity   Alcohol use: No   Drug use: No   Sexual activity: Not on file  Other Topics Concern   Not on file  Social History Narrative   Not on file   Social Determinants of Health   Financial Resource Strain: Not on file  Food Insecurity: Not on file  Transportation Needs: Not on file  Physical  Activity: Not on file  Stress: Not on file  Social Connections: Not on file  Intimate Partner Violence: Not on file    FAMILY HISTORY:  Family History  Problem Relation Age of Onset   Cancer Father    Hypertension Sister    Hypertension Sister     CURRENT MEDICATIONS:  Current Outpatient Medications  Medication Sig Dispense Refill   alendronate (FOSAMAX) 70 MG tablet Take 70 mg by mouth once a week.  0   ALPRAZolam (XANAX) 0.5 MG tablet Take 0.5 mg by mouth 3 (three) times daily.  2   aspirin EC 81 MG EC tablet Take 1 tablet (81 mg total) by mouth daily. Swallow whole. 30 tablet 11   atorvastatin (LIPITOR) 40 MG tablet Take 1 tablet (40 mg total) by mouth daily. 30 tablet 0   calcium carbonate (OS-CAL - DOSED IN MG OF ELEMENTAL CALCIUM) 1250 (500 CA) MG tablet Take 1 tablet by mouth 3 (three) times a week.     cholecalciferol (VITAMIN D) 1000 UNITS tablet Take 1,000 Units by mouth 3 (three) times a week.     Cyanocobalamin (VITAMIN B-12 PO) Take 1 tablet by mouth 3 (three) times a week.     levothyroxine (SYNTHROID) 100 MCG tablet Take 1 tablet by mouth daily before breakfast. 30 tablet 3   metoprolol tartrate (LOPRESSOR) 25 MG tablet Take 1 tablet (25 mg total) by mouth 2 (two) times daily. 60 tablet 0   nystatin (MYCOSTATIN) 100000 UNIT/ML suspension Take 10 mLs (1,000,000 Units total) by mouth 4 (four) times daily. Swish and spit 480 mL 1   prednisoLONE acetate (PRED FORTE) 1 % ophthalmic suspension Place 1 drop into both eyes See admin instructions. Instill one drop into the right eye twice daily and one drop into the left eye once daily.     No current facility-administered medications for this visit.    ALLERGIES:  Allergies  Allergen Reactions   Tape     Pulls skin, please use paper tape    PHYSICAL EXAM:  Performance status (ECOG): 0 - Asymptomatic  There were no vitals filed for this visit.   Wt Readings from Last 3 Encounters:  01/29/22 177 lb 0.5 oz (80.3 kg)   01/07/22 179 lb 0.2 oz (81.2 kg)  12/18/21 174 lb (78.9 kg)   Physical Exam Vitals reviewed.  Constitutional:      Appearance: Normal appearance.  Cardiovascular:     Rate and Rhythm: Normal rate and regular rhythm.     Pulses: Normal pulses.     Heart sounds: Normal heart sounds.  Pulmonary:     Breath sounds: Normal breath sounds.  Neurological:     General: No focal deficit present.     Mental Status: She is alert and oriented to person, place, and time.  Psychiatric:        Mood and Affect: Mood normal.        Behavior: Behavior normal.      LABORATORY DATA:  I have reviewed the  labs as listed.     Latest Ref Rng & Units 02/19/2022    9:39 AM 01/29/2022   10:02 AM 01/07/2022   11:39 AM  CBC  WBC 4.0 - 10.5 K/uL 5.0  5.9  5.5   Hemoglobin 12.0 - 15.0 g/dL 12.1  12.4  11.8   Hematocrit 36.0 - 46.0 % 36.7  37.5  36.5   Platelets 150 - 400 K/uL 175  154  150       Latest Ref Rng & Units 02/19/2022    9:39 AM 01/29/2022   10:02 AM 01/07/2022   11:39 AM  CMP  Glucose 70 - 99 mg/dL 101  126  110   BUN 8 - 23 mg/dL _0 Creatinine 0.44 - 1.00 mg/dL 0.72  0.74  0.80   Sodium 135 - 145 mmol/L 135  138  137   Potassium 3.5 - 5.1 mmol/L 4.0  3.9  4.4   Chloride 98 - 111 mmol/L 100  103  103   CO2 22 - 32 mmol/L _1 Calcium 8.9 - 10.3 mg/dL 8.7  8.7  8.9   Total Protein 6.5 - 8.1 g/dL 6.4  6.5  6.4   Total Bilirubin 0.3 - 1.2 mg/dL 0.8  0.8  1.1   Alkaline Phos 38 - 126 U/L 245  202  161   AST 15 - 41 U/L 63  59  44   ALT 0 - 44 U/L 47  47  43     DIAGNOSTIC IMAGING:  I have independently reviewed the scans and discussed with the patient. NM PET Image Restage (PS) Whole Body  Result Date: 02/16/2022 CLINICAL DATA:  Subsequent treatment strategy for melanoma. EXAM: NUCLEAR MEDICINE PET WHOLE BODY TECHNIQUE: 9.23 mCi F-18 FDG was injected intravenously. Full-ring PET imaging was performed from the head to foot after the radiotracer. CT data was obtained  and used for attenuation correction and anatomic localization. Fasting blood glucose: 116 mg/dl COMPARISON:  11/14/2021 FINDINGS: Mediastinal blood pool activity: SUV max 3.0 HEAD/NECK: Tracer avid nodule within the deep right parotid gland is again noted measuring 8 mm with SUV max 5.07, image 63/3. Previously this measured 8 mm with SUV max of 4.9. Incidental CT findings: none CHEST: No hypermetabolic mediastinal or hilar nodes. No suspicious pulmonary nodules on the CT scan. Incidental CT findings: Aortic atherosclerosis and coronary artery calcifications. ABDOMEN/PELVIS: No abnormal hypermetabolic activity within the liver, pancreas, adrenal glands, or spleen. No hypermetabolic lymph nodes in the abdomen or pelvis. Within the presacral soft tissue space there is a predominantly fat attenuating mass with internal soft tissue components measuring 6.5 x 6.2 cm. There is mild tracer uptake localizing to the soft tissue components with SUV max of 2.49, image 249/3. On the previous exam this was measured at 6.9 x 5.6 cm with SUV max of 2.3. Incidental CT findings: Aortic atherosclerosis.  Gallstones. SKELETON: No focal hypermetabolic activity to suggest skeletal metastasis. Incidental CT findings: none EXTREMITIES: There is a tracer avid soft tissue nodule within the medial right thigh measuring 1.4 x 1.2 cm with SUV max of 5.95, image 322/3. On the previous exam this measured 1 cm with SUV max of 2.0. Interval recurrence of FDG avid lesion within the mid shaft of the right femur. Here, a focal area of increased uptake currently measures 0.8 cm with SUV max of 8.21, image 354/3. Similar mild tracer uptake within the area of postsurgical change in the  medial right calf. SUV max is equal to 2.04 on today's study, image 479/3. Previously 2.2. Incidental CT findings: none IMPRESSION: 1. Interval recurrence of FDG avid lesion within the mid shaft of the right femur. 2. Interval increase in size and degree of FDG uptake  associated with soft tissue nodule within the medial right thigh. 3. Similar appearance of tracer avid nodule within the deep right parotid gland. This is favored to represent a primary parotid neoplasm. Consider referral to ENT for further management. 4. Similar appearance of fat and soft tissue attenuating mass within the presacral soft tissue space with mild low level FDG uptake. 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 5.  Aortic Atherosclerosis (ICD10-I70.0). Aortic Atherosclerosis (ICD10-I70.0). Coronary artery calcifications. Electronically Signed   By: Kerby Moors M.D.   On: 02/16/2022 14:23     ASSESSMENT:  Malignant melanoma of right posterior leg (pT4 pN3 M1): - She noticed fleshy lesion 4 months ago on the right leg. - Her PMD did a biopsy which was consistent with melanoma. - Wide local excision and lymph node biopsy by Dr. Barry Dienes on 02/12/2021. - Pathology margins free, nodular type, Breslow's thickness 9.52 mm, Clark level V, deep margins free, ulceration absent, satellitosis absent, mitotic index 2/millimeters squared, LVI negative.  Neurotropism absent.  Tumor infiltrating lymphocytes absent.  Tumor regression not noted.  3 out of 3 positive sentinel lymph nodes for melanoma. - PET CT scan on 03/22/2021 with 3 foci of intense radiotracer activity within the right lower extremity.  In the medial right upper thigh nodule just superficial to thigh musculature measuring 11 mm with SUV 7.6.  Intramedullary hypermetabolic activity within the shaft of the mid right femur with SUV 14.6.  Intense activity associated with condylar notch of the distal right femur, appears to localize to soft tissue rather than the bone.  More diffuse activity associated with the medial right calf related to excision biopsy.  There is a focus of activity adjacent to the lateral margin of the right iliac wing SUV 4.1.  No evidence of visceral metastatic disease in the chest,  abdomen or pelvis. 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 - NGS testing-BRAF negative, positive for NRAS pathogenic variant exon 3, TERT promoter.  MSI-stable.  MMR-proficient.  [place] 1/2/3 fusion not detected.  KIT mutation negative.  PD-L1 negative. - Nivolumab-Relatlimab every 28 days started on 05/09/2021.  Held permanently after first dose due to myocarditis. - PET scan on 07/25/2021: Slight progression with no new sites of metastatic disease. - Pembrolizumab started on 09/03/2021    Social/family history: - She is seen with her son today.  She lives by herself at home.  She is independent of ADLs and IADLs.  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  She is non-smoker. - Father had colon cancer.  Sister had breast cancer and colon cancer.  Maternal aunt had breast cancer.  Paternal uncle had colon cancer.   PLAN:  Malignant melanoma of the right posterior leg (pT4 pN3 M1), BRAF V600 negative: - PET scan on 02/13/2022 showed soft tissue nodule within the medial right thigh measuring 1.4 x 1.2 cm, SUV 5.95.  Previous exam this measured 1 cm with SUV 2.0.  FDG avid midshaft right femoral lesion, 0.8 cm with SUV 8.2. - She will start radiation  therapy to these lesions on 03/26/2022 with Dr. Lynnette Caffey. - She does not report any immunotherapy related side effects.  Reviewed labs today which showed AST of 48 and ALT of 82.  Total bilirubin 1.4.  CBC normal.  TSH 2.8.  Proceed with next cycle of Keytruda.  RTC 4 weeks for follow-up.  2.  History of immunotherapy myocarditis: - She does not have any symptoms of chest pain.  Troponin today is 12.  3.  Right medial thigh pain: - She has slight soreness in the right medial thigh subcutaneous nodule.  4.  Hypothyroidism: - Continue Synthroid 100  mcg daily.  TSH improved to 2.8 from 5.8 at last visit.   Orders placed this encounter:  No orders of the defined types were placed in this encounter.    Derek Jack, MD [facility] [phone]

## 2022-03-13 DIAGNOSIS — C7952 Secondary malignant neoplasm of bone marrow: Secondary | ICD-10-CM | POA: Diagnosis not present

## 2022-03-13 DIAGNOSIS — C7951 Secondary malignant neoplasm of bone: Secondary | ICD-10-CM | POA: Diagnosis not present

## 2022-03-13 DIAGNOSIS — C439 Malignant melanoma of skin, unspecified: Secondary | ICD-10-CM | POA: Diagnosis not present

## 2022-03-13 DIAGNOSIS — C7989 Secondary malignant neoplasm of other specified sites: Secondary | ICD-10-CM | POA: Diagnosis not present

## 2022-03-13 DIAGNOSIS — C4371 Malignant melanoma of right lower limb, including hip: Secondary | ICD-10-CM | POA: Diagnosis not present

## 2022-03-13 DIAGNOSIS — C4370 Malignant melanoma of unspecified lower limb, including hip: Secondary | ICD-10-CM | POA: Diagnosis not present

## 2022-03-13 LAB — T4: T4, Total: 11.3 ug/dL (ref 4.5–12.0)

## 2022-03-27 DIAGNOSIS — C7952 Secondary malignant neoplasm of bone marrow: Secondary | ICD-10-CM | POA: Diagnosis not present

## 2022-03-27 DIAGNOSIS — C7951 Secondary malignant neoplasm of bone: Secondary | ICD-10-CM | POA: Diagnosis not present

## 2022-03-27 DIAGNOSIS — C439 Malignant melanoma of skin, unspecified: Secondary | ICD-10-CM | POA: Diagnosis not present

## 2022-03-27 DIAGNOSIS — C7989 Secondary malignant neoplasm of other specified sites: Secondary | ICD-10-CM | POA: Diagnosis not present

## 2022-03-27 DIAGNOSIS — C4371 Malignant melanoma of right lower limb, including hip: Secondary | ICD-10-CM | POA: Diagnosis not present

## 2022-03-27 DIAGNOSIS — C4370 Malignant melanoma of unspecified lower limb, including hip: Secondary | ICD-10-CM | POA: Diagnosis not present

## 2022-03-28 DIAGNOSIS — C439 Malignant melanoma of skin, unspecified: Secondary | ICD-10-CM | POA: Diagnosis not present

## 2022-03-28 DIAGNOSIS — C4370 Malignant melanoma of unspecified lower limb, including hip: Secondary | ICD-10-CM | POA: Diagnosis not present

## 2022-03-28 DIAGNOSIS — C7989 Secondary malignant neoplasm of other specified sites: Secondary | ICD-10-CM | POA: Diagnosis not present

## 2022-03-28 DIAGNOSIS — C7952 Secondary malignant neoplasm of bone marrow: Secondary | ICD-10-CM | POA: Diagnosis not present

## 2022-03-28 DIAGNOSIS — C4371 Malignant melanoma of right lower limb, including hip: Secondary | ICD-10-CM | POA: Diagnosis not present

## 2022-03-28 DIAGNOSIS — C7951 Secondary malignant neoplasm of bone: Secondary | ICD-10-CM | POA: Diagnosis not present

## 2022-03-31 DIAGNOSIS — C7989 Secondary malignant neoplasm of other specified sites: Secondary | ICD-10-CM | POA: Diagnosis not present

## 2022-03-31 DIAGNOSIS — C4371 Malignant melanoma of right lower limb, including hip: Secondary | ICD-10-CM | POA: Diagnosis not present

## 2022-03-31 DIAGNOSIS — C7951 Secondary malignant neoplasm of bone: Secondary | ICD-10-CM | POA: Diagnosis not present

## 2022-03-31 DIAGNOSIS — C439 Malignant melanoma of skin, unspecified: Secondary | ICD-10-CM | POA: Diagnosis not present

## 2022-03-31 DIAGNOSIS — C7952 Secondary malignant neoplasm of bone marrow: Secondary | ICD-10-CM | POA: Diagnosis not present

## 2022-03-31 DIAGNOSIS — C4370 Malignant melanoma of unspecified lower limb, including hip: Secondary | ICD-10-CM | POA: Diagnosis not present

## 2022-04-01 ENCOUNTER — Ambulatory Visit: Payer: Medicare Other

## 2022-04-01 ENCOUNTER — Other Ambulatory Visit: Payer: Medicare Other

## 2022-04-01 ENCOUNTER — Ambulatory Visit: Payer: Medicare Other | Admitting: Hematology

## 2022-04-02 DIAGNOSIS — C7989 Secondary malignant neoplasm of other specified sites: Secondary | ICD-10-CM | POA: Diagnosis not present

## 2022-04-02 DIAGNOSIS — C4371 Malignant melanoma of right lower limb, including hip: Secondary | ICD-10-CM | POA: Diagnosis not present

## 2022-04-02 DIAGNOSIS — C439 Malignant melanoma of skin, unspecified: Secondary | ICD-10-CM | POA: Diagnosis not present

## 2022-04-02 DIAGNOSIS — C4370 Malignant melanoma of unspecified lower limb, including hip: Secondary | ICD-10-CM | POA: Diagnosis not present

## 2022-04-02 DIAGNOSIS — C7952 Secondary malignant neoplasm of bone marrow: Secondary | ICD-10-CM | POA: Diagnosis not present

## 2022-04-02 DIAGNOSIS — C7951 Secondary malignant neoplasm of bone: Secondary | ICD-10-CM | POA: Diagnosis not present

## 2022-04-07 DIAGNOSIS — C7951 Secondary malignant neoplasm of bone: Secondary | ICD-10-CM | POA: Diagnosis not present

## 2022-04-07 DIAGNOSIS — C4371 Malignant melanoma of right lower limb, including hip: Secondary | ICD-10-CM | POA: Diagnosis not present

## 2022-04-07 DIAGNOSIS — C7952 Secondary malignant neoplasm of bone marrow: Secondary | ICD-10-CM | POA: Diagnosis not present

## 2022-04-07 DIAGNOSIS — C4370 Malignant melanoma of unspecified lower limb, including hip: Secondary | ICD-10-CM | POA: Diagnosis not present

## 2022-04-07 DIAGNOSIS — Z1231 Encounter for screening mammogram for malignant neoplasm of breast: Secondary | ICD-10-CM | POA: Diagnosis not present

## 2022-04-07 DIAGNOSIS — C439 Malignant melanoma of skin, unspecified: Secondary | ICD-10-CM | POA: Diagnosis not present

## 2022-04-07 DIAGNOSIS — C7989 Secondary malignant neoplasm of other specified sites: Secondary | ICD-10-CM | POA: Diagnosis not present

## 2022-04-09 DIAGNOSIS — C7989 Secondary malignant neoplasm of other specified sites: Secondary | ICD-10-CM | POA: Diagnosis not present

## 2022-04-09 DIAGNOSIS — C439 Malignant melanoma of skin, unspecified: Secondary | ICD-10-CM | POA: Diagnosis not present

## 2022-04-09 DIAGNOSIS — C4371 Malignant melanoma of right lower limb, including hip: Secondary | ICD-10-CM | POA: Diagnosis not present

## 2022-04-09 DIAGNOSIS — C7952 Secondary malignant neoplasm of bone marrow: Secondary | ICD-10-CM | POA: Diagnosis not present

## 2022-04-09 DIAGNOSIS — C4370 Malignant melanoma of unspecified lower limb, including hip: Secondary | ICD-10-CM | POA: Diagnosis not present

## 2022-04-09 DIAGNOSIS — C7951 Secondary malignant neoplasm of bone: Secondary | ICD-10-CM | POA: Diagnosis not present

## 2022-04-10 ENCOUNTER — Inpatient Hospital Stay: Payer: Medicare Other

## 2022-04-10 ENCOUNTER — Inpatient Hospital Stay (HOSPITAL_BASED_OUTPATIENT_CLINIC_OR_DEPARTMENT_OTHER): Payer: Medicare Other | Admitting: Hematology

## 2022-04-10 DIAGNOSIS — C4371 Malignant melanoma of right lower limb, including hip: Secondary | ICD-10-CM | POA: Diagnosis not present

## 2022-04-10 DIAGNOSIS — Z95828 Presence of other vascular implants and grafts: Secondary | ICD-10-CM | POA: Diagnosis not present

## 2022-04-10 DIAGNOSIS — I409 Acute myocarditis, unspecified: Secondary | ICD-10-CM

## 2022-04-10 DIAGNOSIS — I119 Hypertensive heart disease without heart failure: Secondary | ICD-10-CM | POA: Diagnosis not present

## 2022-04-10 DIAGNOSIS — R2241 Localized swelling, mass and lump, right lower limb: Secondary | ICD-10-CM | POA: Diagnosis not present

## 2022-04-10 DIAGNOSIS — E039 Hypothyroidism, unspecified: Secondary | ICD-10-CM | POA: Diagnosis not present

## 2022-04-10 DIAGNOSIS — I514 Myocarditis, unspecified: Secondary | ICD-10-CM

## 2022-04-10 DIAGNOSIS — Z5112 Encounter for antineoplastic immunotherapy: Secondary | ICD-10-CM | POA: Diagnosis not present

## 2022-04-10 DIAGNOSIS — Z800 Family history of malignant neoplasm of digestive organs: Secondary | ICD-10-CM | POA: Diagnosis not present

## 2022-04-10 DIAGNOSIS — Z803 Family history of malignant neoplasm of breast: Secondary | ICD-10-CM | POA: Diagnosis not present

## 2022-04-10 DIAGNOSIS — M79651 Pain in right thigh: Secondary | ICD-10-CM | POA: Diagnosis not present

## 2022-04-10 DIAGNOSIS — C7951 Secondary malignant neoplasm of bone: Secondary | ICD-10-CM | POA: Diagnosis not present

## 2022-04-10 DIAGNOSIS — Z79899 Other long term (current) drug therapy: Secondary | ICD-10-CM | POA: Diagnosis not present

## 2022-04-10 DIAGNOSIS — Z2989 Encounter for other specified prophylactic measures: Secondary | ICD-10-CM

## 2022-04-10 LAB — CBC WITH DIFFERENTIAL/PLATELET
Abs Immature Granulocytes: 0.01 10*3/uL (ref 0.00–0.07)
Basophils Absolute: 0 10*3/uL (ref 0.0–0.1)
Basophils Relative: 1 %
Eosinophils Absolute: 0.1 10*3/uL (ref 0.0–0.5)
Eosinophils Relative: 3 %
HCT: 37.5 % (ref 36.0–46.0)
Hemoglobin: 12.1 g/dL (ref 12.0–15.0)
Immature Granulocytes: 0 %
Lymphocytes Relative: 23 %
Lymphs Abs: 0.9 10*3/uL (ref 0.7–4.0)
MCH: 29.9 pg (ref 26.0–34.0)
MCHC: 32.3 g/dL (ref 30.0–36.0)
MCV: 92.6 fL (ref 80.0–100.0)
Monocytes Absolute: 0.4 10*3/uL (ref 0.1–1.0)
Monocytes Relative: 10 %
Neutro Abs: 2.5 10*3/uL (ref 1.7–7.7)
Neutrophils Relative %: 63 %
Platelets: 157 10*3/uL (ref 150–400)
RBC: 4.05 MIL/uL (ref 3.87–5.11)
RDW: 13.8 % (ref 11.5–15.5)
WBC: 3.9 10*3/uL — ABNORMAL LOW (ref 4.0–10.5)
nRBC: 0 % (ref 0.0–0.2)

## 2022-04-10 LAB — COMPREHENSIVE METABOLIC PANEL
ALT: 163 U/L — ABNORMAL HIGH (ref 0–44)
AST: 238 U/L — ABNORMAL HIGH (ref 15–41)
Albumin: 3.2 g/dL — ABNORMAL LOW (ref 3.5–5.0)
Alkaline Phosphatase: 438 U/L — ABNORMAL HIGH (ref 38–126)
Anion gap: 7 (ref 5–15)
BUN: 10 mg/dL (ref 8–23)
CO2: 28 mmol/L (ref 22–32)
Calcium: 8.6 mg/dL — ABNORMAL LOW (ref 8.9–10.3)
Chloride: 99 mmol/L (ref 98–111)
Creatinine, Ser: 0.89 mg/dL (ref 0.44–1.00)
GFR, Estimated: >60 mL/min (ref >60)
Glucose, Bld: 134 mg/dL — ABNORMAL HIGH (ref 70–99)
Potassium: 4.2 mmol/L (ref 3.5–5.1)
Sodium: 134 mmol/L — ABNORMAL LOW (ref 135–145)
Total Bilirubin: 1.1 mg/dL (ref 0.3–1.2)
Total Protein: 6.8 g/dL (ref 6.5–8.1)

## 2022-04-10 LAB — MAGNESIUM: Magnesium: 2 mg/dL (ref 1.7–2.4)

## 2022-04-10 LAB — TROPONIN I (HIGH SENSITIVITY): Troponin I (High Sensitivity): 17 ng/L (ref <18)

## 2022-04-10 LAB — TSH: TSH: 3.216 u[IU]/mL (ref 0.350–4.500)

## 2022-04-10 MED ORDER — HEPARIN SOD (PORK) LOCK FLUSH 100 UNIT/ML IV SOLN
500.0000 [IU] | Freq: Once | INTRAVENOUS | Status: AC
  Administered 2022-04-10: 500 [IU] via INTRAVENOUS

## 2022-04-10 MED ORDER — SODIUM CHLORIDE 0.9% FLUSH
10.0000 mL | INTRAVENOUS | Status: AC
  Administered 2022-04-10: 10 mL

## 2022-04-10 NOTE — Patient Instructions (Addendum)
Elizabeth Norman at [facility] Discharge Instructions   You were seen and examined today by Dr. Delton Coombes.  He reviewed the results of your lab work which are mostly normal. Your liver enzymes are elevated, almost doubled what they were at last check.   We will hold treatment today to give your liver numbers a chance to come down.   Stop taking Lipitor until we repeat your blood work.   Return as scheduled.    Thank you for choosing [place] at [facility] to provide your oncology and hematology care.  To afford each patient quality time with our provider, please arrive at least 15 minutes before your scheduled appointment time.   If you have a lab appointment with the [facility] please come in thru the Main Entrance and check in at the main information desk.  You need to re-schedule your appointment should you arrive 10 or more minutes late.  We strive to give you quality time with our providers, and arriving late affects you and other patients whose appointments are after yours.  Also, if you no show three or more times for appointments you may be dismissed from the clinic at the providers discretion.     Again, thank you for choosing [facility].  Our hope is that these requests will decrease the amount of time that you wait before being seen by our physicians.       _____________________________________________________________  Should you have questions after your visit to [facility], please contact our office at [phone] and follow the prompts.  Our office hours are 8:00 a.m. and 4:30 p.m. Monday - Friday.  Please note that voicemails left after 4:00 p.m. may not be returned until the following business day.  We are closed weekends and major holidays.  You do have access to a nurse 24-7, just call the main number to the clinic [phone] and do not press any options, hold on the line and a nurse will answer  the phone.    For prescription refill requests, have your pharmacy contact our office and allow 72 hours.    Due to Covid, you will need to wear a mask upon entering the hospital. If you do not have a mask, a mask will be given to you at the Main Entrance upon arrival. For doctor visits, patients may have 1 support person age 16 or older with them. For treatment visits, patients can not have anyone with them due to social distancing guidelines and our immunocompromised population.

## 2022-04-10 NOTE — Progress Notes (Signed)
Elizabeth Norman,  [id number]   CLINIC:  Medical Oncology/Hematology  PCP:  Neale Burly, MD [place] / [place] [address] [phone]   REASON FOR VISIT:  Follow-up for metastatic malignant melanoma, BRAF V600 negative  PRIOR THERAPY: none  NGS Results: BRAF negative, positive for NRAS pathogenic variant exon 3, TERT promoter.  MSI-stable.  MMR-proficient.  [place] 1/2/3 fusion not detected.  KIT mutation negative.  PD-L1 negative.  CURRENT THERAPY: Pembrolizumab (200) q21d Keytruda  BRIEF ONCOLOGIC HISTORY:  Oncology History  Malignant melanoma of lower leg, right ([place])  03/27/2021 Initial Diagnosis   Malignant melanoma of lower leg, right ([place])   05/09/2021 - 05/09/2021 Chemotherapy   Patient is on Treatment Plan : MELANOMA - [place] (nivolumab/relatlimab) q28d     09/03/2021 - 01/07/2022 Chemotherapy   Patient is on Treatment Plan : MELANOMA Pembrolizumab (200) q21d     09/03/2021 -  Chemotherapy   Patient is on Treatment Plan : MELANOMA Pembrolizumab (200) q21d       CANCER STAGING:  Cancer Staging  Malignant melanoma of lower leg, right ([place]) Staging form: Melanoma of the Skin, AJCC 8th Edition - Clinical stage from 03/27/2021: Stage IV (cT4a, cN3, cM1) - Unsigned   INTERVAL HISTORY:  [name], a 81 y.o. female, seen for follow-up and toxicity assessment prior to her next treatment for metastatic melanoma.  Denies any chest pains.  Very mild right thigh pain 3 out of 10 in intensity.  XRT 5 treatments completed on 04/09/2022.  More tiredness since XRT was reported.  REVIEW OF SYSTEMS:  Review of Systems  Constitutional:  Negative for appetite change and fatigue.  Psychiatric/Behavioral:  The patient is nervous/anxious.   All other systems reviewed and are negative.   PAST MEDICAL/SURGICAL HISTORY:  Past Medical History:  Diagnosis Date   Arthritis    Hypertension    Malignant melanoma of lower  leg, right ([place]) 03/27/2021   Port-A-Cath in place 04/30/2021   Past Surgical History:  Procedure Laterality Date   CATARACT EXTRACTION W/PHACO Right 10/03/2014   Procedure: CATARACT EXTRACTION PHACO AND INTRAOCULAR LENS PLACEMENT ([place]);  Surgeon: [name], MD;  Location: [facility];  Service: Ophthalmology;  Laterality: Right;  CDE:10.09   CATARACT EXTRACTION W/PHACO Left 10/10/2014   Procedure: CATARACT EXTRACTION PHACO AND INTRAOCULAR LENS PLACEMENT ([facility]);  Surgeon: [name], MD;  Location: [facility];  Service: Ophthalmology;  Laterality: Left;  CDE:8.69   CORNEAL 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   CORNEAL TRANSPLANT Left 2020   DENTAL RESTORATION/EXTRACTION WITH X-RAY     IR IMAGING GUIDED PORT INSERTION  04/05/2021   LEFT HEART CATH AND CORONARY ANGIOGRAPHY N/A 05/28/2021   Procedure: LEFT HEART CATH AND CORONARY ANGIOGRAPHY;  Surgeon: Nigel Mormon, MD;  Location: [place] [facility];  Service: Cardiovascular;  Laterality: N/A;   MELANOMA EXCISION Right 02/12/2021   Procedure: WIDE LOCAL EXCISION RIGHT LOWER LEG MELANOMA , ADVANCEMENT FLAP CLOSURE DEFECT;  Surgeon: [name], MD;  Location: [place];  Service: General;  Laterality: Right;   SENTINEL NODE BIOPSY Right 02/12/2021   Procedure: SENTINEL NODE BIOPSY;  Surgeon: [name], MD;  Location: [place];  Service: General;  Laterality: Right;    SOCIAL HISTORY:  Social History   Socioeconomic History   Marital status: Widowed    Spouse name: Not on file   Number of children: 3   Years of education: Not on file   Highest education level: Not  on file  Occupational History   Not on file  Tobacco Use   Smoking status: Never   Smokeless tobacco: Never  Vaping Use   Vaping Use: Never used  Substance and Sexual Activity   Alcohol use: No   Drug use: No   Sexual activity: Not on file  Other Topics Concern   Not on file  Social History Narrative   Not on file   Social Determinants of Health   Financial Resource Strain:  Not on file  Food Insecurity: Not on file  Transportation Needs: Not on file  Physical Activity: Not on file  Stress: Not on file  Social Connections: Not on file  Intimate Partner Violence: Not on file    FAMILY HISTORY:  Family History  Problem Relation Age of Onset   Cancer Father    Hypertension Sister    Hypertension Sister     CURRENT MEDICATIONS:  Current Outpatient Medications  Medication Sig Dispense Refill   alendronate (FOSAMAX) 70 MG tablet Take 70 mg by mouth once a week.  0   ALPRAZolam (XANAX) 0.5 MG tablet Take 0.5 mg by mouth 3 (three) times daily.  2   aspirin EC 81 MG EC tablet Take 1 tablet (81 mg total) by mouth daily. Swallow whole. 30 tablet 11   calcium carbonate (OS-CAL - DOSED IN MG OF ELEMENTAL CALCIUM) 1250 (500 CA) MG tablet Take 1 tablet by mouth 3 (three) times a week.     cholecalciferol (VITAMIN D) 1000 UNITS tablet Take 1,000 Units by mouth 3 (three) times a week.     Cyanocobalamin (VITAMIN B-12 PO) Take 1 tablet by mouth 3 (three) times a week.     levothyroxine (SYNTHROID) 100 MCG tablet Take 1 tablet by mouth daily before breakfast. 30 tablet 3   nystatin (MYCOSTATIN) 100000 UNIT/ML suspension Take 10 mLs (1,000,000 Units total) by mouth 4 (four) times daily. Swish and spit 480 mL 1   prednisoLONE acetate (PRED FORTE) 1 % ophthalmic suspension Place 1 drop into both eyes See admin instructions. Instill one drop into the right eye twice daily and one drop into the left eye once daily.     No current facility-administered medications for this visit.    ALLERGIES:  Allergies  Allergen Reactions   Tape     Pulls skin, please use paper tape    PHYSICAL EXAM:  Performance status (ECOG): 0 - Asymptomatic  There were no vitals filed for this visit.   Wt Readings from Last 3 Encounters:  03/12/22 172 lb 9.6 oz (78.3 kg)  01/29/22 177 lb 0.5 oz (80.3 kg)  01/07/22 179 lb 0.2 oz (81.2 kg)   Physical Exam Vitals reviewed.  Constitutional:       Appearance: Normal appearance.  Cardiovascular:     Rate and Rhythm: Normal rate and regular rhythm.     Pulses: Normal pulses.     Heart sounds: Normal heart sounds.  Pulmonary:     Breath sounds: Normal breath sounds.  Neurological:     General: No focal deficit present.     Mental Status: She is alert and oriented to person, place, and time.  Psychiatric:        Mood and Affect: Mood normal.        Behavior: Behavior normal.      LABORATORY DATA:  I have reviewed the labs as listed.     Latest Ref Rng & Units 03/12/2022    9:36 AM 02/19/2022    9:39  AM 01/29/2022   10:02 AM  CBC  WBC 4.0 - 10.5 K/uL 5.0  5.0  5.9   Hemoglobin 12.0 - 15.0 g/dL 12.4  12.1  12.4   Hematocrit 36.0 - 46.0 % 37.6  36.7  37.5   Platelets 150 - 400 K/uL 165  175  154       Latest Ref Rng & Units 03/12/2022    9:36 AM 02/19/2022    9:39 AM 01/29/2022   10:02 AM  CMP  Glucose 70 - 99 mg/dL 113  101  126   BUN 8 - 23 mg/dL _0 Creatinine 0.44 - 1.00 mg/dL 0.75  0.72  0.74   Sodium 135 - 145 mmol/L 134  135  138   Potassium 3.5 - 5.1 mmol/L 4.0  4.0  3.9   Chloride 98 - 111 mmol/L 99  100  103   CO2 22 - 32 mmol/L _1 Calcium 8.9 - 10.3 mg/dL 8.7  8.7  8.7   Total Protein 6.5 - 8.1 g/dL 6.9  6.4  6.5   Total Bilirubin 0.3 - 1.2 mg/dL 1.4  0.8  0.8   Alkaline Phos 38 - 126 U/L 310  245  202   AST 15 - 41 U/L 148  63  59   ALT 0 - 44 U/L 82  47  47     DIAGNOSTIC IMAGING:  I have independently reviewed the scans and discussed with the patient. No results found.   ASSESSMENT:  Malignant melanoma of right posterior leg (pT4 pN3 M1): - She noticed fleshy lesion 4 months ago on the right leg. - Her PMD did a biopsy which was consistent with melanoma. - Wide local excision and lymph node biopsy by Dr. Barry Dienes on 02/12/2021. - Pathology margins free, nodular type, Breslow's thickness 9.52 mm, Clark level V, deep margins free, ulceration absent, satellitosis absent, mitotic  index 2/millimeters squared, LVI negative.  Neurotropism absent.  Tumor infiltrating lymphocytes absent.  Tumor regression not noted.  3 out of 3 positive sentinel lymph nodes for melanoma. - PET CT scan on 03/22/2021 with 3 foci of intense radiotracer activity within the right lower extremity.  In the medial right upper thigh nodule just superficial to thigh musculature measuring 11 mm with SUV 7.6.  Intramedullary hypermetabolic activity within the shaft of the mid right femur with SUV 14.6.  Intense activity associated with condylar notch of the distal right femur, appears to localize to soft tissue rather than the bone.  More diffuse activity associated with the medial right calf related to excision biopsy.  There is a focus of activity adjacent to the lateral margin of the right iliac wing SUV 4.1.  No evidence of visceral metastatic disease in the chest, abdomen or pelvis. 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 - NGS testing-BRAF negative, positive for NRAS pathogenic variant exon 3, TERT promoter.  MSI-stable.  MMR-proficient.  [place] 1/2/3 fusion not detected.  KIT mutation negative.  PD-L1 negative. - Nivolumab-Relatlimab every 28 days started on 05/09/2021.  Held permanently after first dose due to myocarditis. - PET scan on 07/25/2021: Slight progression with no new sites of metastatic disease. - Pembrolizumab started on 09/03/2021    Social/family history: - She is seen with her son today.  She lives by  herself at home.  She is independent of ADLs and IADLs.  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  She is non-smoker. - Father had colon cancer.  Sister had breast cancer and colon cancer.  Maternal aunt had breast cancer.  Paternal uncle had colon cancer.   PLAN:  Malignant melanoma of  the right posterior leg (pT4 pN3 M1), BRAF V600 negative: - PET scan (02/13/2022): Soft tissue nodule within the medial right thigh measuring 1.4 x 1.2 cm, SUV 5.9.  Previous exam 1 cm with SUV 2.0.  FDG avid midshaft right femoral lesion 0.8 cm with SUV 8.2. - Status post XRT 5 treatments from 03/26/2022 through 04/09/2022 - Reviewed labs today which showed elevated AST of 238 and ALT of 163.  They are getting worse.  Bilirubin is 1.1. - She has been on Lipitor since January. - Will hold her treatment today because of worsening LFTs.  Will also hold Lipitor.  Will check LFTs during the week of 04/21/2022.  If they are downtrending, will consider restarting pembrolizumab.  I will see her back prior to her next cycle.  2.  History of immunotherapy myocarditis: - She does not have any signs or symptoms of chest pain.  Troponin today 17.  3.  Right medial thigh pain: - Pain is reported as 3 out of 10 in intensity.  We will closely monitor.  She finished radiation yesterday.  4.  Hypothyroidism: - Continue Synthroid 100 mcg daily.  TSH is 3.2.   Orders placed this encounter:  No orders of the defined types were placed in this encounter.    Derek Jack, MD [place] [phone]

## 2022-04-10 NOTE — Progress Notes (Signed)
Patient presents today for treatment and follow up visit with Dr. Delton Coombes. Vital signs within parameters for treatment today.    Message received from A. Anderson RN / Dr. Delton Coombes NO treatment today due to elevated AST, ALT.   Portacath de-accessed and flushed.  Portacath located right chest wall. Good blood return present. Portacath flushed with 10 ml NS and 500U/7m Heparin and needle removed intact.  Procedure tolerated well and without incident. No complaints at this time. Discharged from clinic ambulatory in stable condition. Alert and oriented x 3. [facility]

## 2022-04-16 DIAGNOSIS — C439 Malignant melanoma of skin, unspecified: Secondary | ICD-10-CM | POA: Diagnosis not present

## 2022-04-16 DIAGNOSIS — E7849 Other hyperlipidemia: Secondary | ICD-10-CM | POA: Diagnosis not present

## 2022-04-16 DIAGNOSIS — M810 Age-related osteoporosis without current pathological fracture: Secondary | ICD-10-CM | POA: Diagnosis not present

## 2022-04-16 DIAGNOSIS — I10 Essential (primary) hypertension: Secondary | ICD-10-CM | POA: Diagnosis not present

## 2022-04-21 ENCOUNTER — Inpatient Hospital Stay: Payer: Medicare Other

## 2022-04-21 ENCOUNTER — Inpatient Hospital Stay: Payer: Medicare Other | Attending: Hematology

## 2022-04-21 VITALS — BP 116/58 | HR 64 | Temp 97.4°F | Resp 18

## 2022-04-21 DIAGNOSIS — C4371 Malignant melanoma of right lower limb, including hip: Secondary | ICD-10-CM | POA: Insufficient documentation

## 2022-04-21 DIAGNOSIS — Z5112 Encounter for antineoplastic immunotherapy: Secondary | ICD-10-CM | POA: Insufficient documentation

## 2022-04-21 DIAGNOSIS — I409 Acute myocarditis, unspecified: Secondary | ICD-10-CM

## 2022-04-21 DIAGNOSIS — Z95828 Presence of other vascular implants and grafts: Secondary | ICD-10-CM

## 2022-04-21 DIAGNOSIS — I514 Myocarditis, unspecified: Secondary | ICD-10-CM

## 2022-04-21 DIAGNOSIS — Z2989 Encounter for other specified prophylactic measures: Secondary | ICD-10-CM

## 2022-04-21 LAB — CBC WITH DIFFERENTIAL/PLATELET
Abs Immature Granulocytes: 0.01 10*3/uL (ref 0.00–0.07)
Basophils Absolute: 0 10*3/uL (ref 0.0–0.1)
Basophils Relative: 1 %
Eosinophils Absolute: 0.2 10*3/uL (ref 0.0–0.5)
Eosinophils Relative: 4 %
HCT: 37 % (ref 36.0–46.0)
Hemoglobin: 12.3 g/dL (ref 12.0–15.0)
Immature Granulocytes: 0 %
Lymphocytes Relative: 26 %
Lymphs Abs: 1 10*3/uL (ref 0.7–4.0)
MCH: 30.2 pg (ref 26.0–34.0)
MCHC: 33.2 g/dL (ref 30.0–36.0)
MCV: 90.9 fL (ref 80.0–100.0)
Monocytes Absolute: 0.4 10*3/uL (ref 0.1–1.0)
Monocytes Relative: 11 %
Neutro Abs: 2.2 10*3/uL (ref 1.7–7.7)
Neutrophils Relative %: 58 %
Platelets: 141 10*3/uL — ABNORMAL LOW (ref 150–400)
RBC: 4.07 MIL/uL (ref 3.87–5.11)
RDW: 13.6 % (ref 11.5–15.5)
WBC: 3.7 10*3/uL — ABNORMAL LOW (ref 4.0–10.5)
nRBC: 0 % (ref 0.0–0.2)

## 2022-04-21 LAB — COMPREHENSIVE METABOLIC PANEL
ALT: 48 U/L — ABNORMAL HIGH (ref 0–44)
AST: 54 U/L — ABNORMAL HIGH (ref 15–41)
Albumin: 3.2 g/dL — ABNORMAL LOW (ref 3.5–5.0)
Alkaline Phosphatase: 202 U/L — ABNORMAL HIGH (ref 38–126)
Anion gap: 6 (ref 5–15)
BUN: 11 mg/dL (ref 8–23)
CO2: 28 mmol/L (ref 22–32)
Calcium: 8.5 mg/dL — ABNORMAL LOW (ref 8.9–10.3)
Chloride: 101 mmol/L (ref 98–111)
Creatinine, Ser: 0.74 mg/dL (ref 0.44–1.00)
GFR, Estimated: >60 mL/min (ref >60)
Glucose, Bld: 104 mg/dL — ABNORMAL HIGH (ref 70–99)
Potassium: 3.7 mmol/L (ref 3.5–5.1)
Sodium: 135 mmol/L (ref 135–145)
Total Bilirubin: 0.7 mg/dL (ref 0.3–1.2)
Total Protein: 6.6 g/dL (ref 6.5–8.1)

## 2022-04-21 LAB — TROPONIN I (HIGH SENSITIVITY): Troponin I (High Sensitivity): 11 ng/L (ref <18)

## 2022-04-21 LAB — MAGNESIUM: Magnesium: 1.8 mg/dL (ref 1.7–2.4)

## 2022-04-21 MED ORDER — SODIUM CHLORIDE 0.9 % IV SOLN: Freq: Once | INTRAVENOUS | Status: AC

## 2022-04-21 MED ORDER — SODIUM CHLORIDE 0.9% FLUSH
10.0000 mL | Freq: Once | INTRAVENOUS | Status: AC
  Administered 2022-04-21: 10 mL via INTRAVENOUS

## 2022-04-21 MED ORDER — HEPARIN SOD (PORK) LOCK FLUSH 100 UNIT/ML IV SOLN
500.0000 [IU] | Freq: Once | INTRAVENOUS | Status: AC | PRN
  Administered 2022-04-21: 500 [IU]

## 2022-04-21 MED ORDER — SODIUM CHLORIDE 0.9 % IV SOLN
200.0000 mg | Freq: Once | INTRAVENOUS | Status: AC
  Administered 2022-04-21: 200 mg via INTRAVENOUS
  Filled 2022-04-21: qty 8

## 2022-04-21 MED ORDER — SODIUM CHLORIDE 0.9% FLUSH
10.0000 mL | INTRAVENOUS | Status: DC | PRN
  Administered 2022-04-21: 10 mL

## 2022-04-21 NOTE — Progress Notes (Signed)
Patient presents today for Keytruda infusion per providers order.  Vital signs within parameters for treatment.  Patient has no new complaints at this time.  Labs with in parameters for treatment.  Treatment given today per MD orders.  Stable during infusion without adverse affects.  Vital signs stable.  No complaints at this time.  Discharge from clinic ambulatory in stable condition.  Alert and oriented X 3.  Follow up with [facility] as scheduled.

## 2022-04-21 NOTE — Patient Instructions (Signed)
[facility]  Discharge Instructions: Thank you for choosing [facility] to provide your oncology and hematology care.  If you have a lab appointment with the Cancer Center, please come in thru the Main Entrance and check in at the main information desk.  Wear comfortable clothing and clothing appropriate for easy access to any Portacath or PICC line.   We strive to give you quality time with your provider. You may need to reschedule your appointment if you arrive late (15 or more minutes).  Arriving late affects you and other patients whose appointments are after yours.  Also, if you miss three or more appointments without notifying the office, you may be dismissed from the clinic at the provider's discretion.      For prescription refill requests, have your pharmacy contact our office and allow 72 hours for refills to be completed.    Today you received the following chemotherapy and/or immunotherapy agents Keytruda      To help prevent nausea and vomiting after your treatment, we encourage you to take your nausea medication as directed.  BELOW ARE SYMPTOMS THAT SHOULD BE REPORTED IMMEDIATELY: *FEVER GREATER THAN 100.4 F (38 C) OR HIGHER *CHILLS OR SWEATING *NAUSEA AND VOMITING THAT IS NOT CONTROLLED WITH YOUR NAUSEA MEDICATION *UNUSUAL SHORTNESS OF BREATH *UNUSUAL BRUISING OR BLEEDING *URINARY PROBLEMS (pain or burning when urinating, or frequent urination) *BOWEL PROBLEMS (unusual diarrhea, constipation, pain near the anus) TENDERNESS IN MOUTH AND THROAT WITH OR WITHOUT PRESENCE OF ULCERS (sore throat, sores in mouth, or a toothache) UNUSUAL RASH, SWELLING OR PAIN  UNUSUAL VAGINAL DISCHARGE OR ITCHING   Items with * indicate a potential emergency and should be followed up as soon as possible or go to the Emergency Department if any problems should occur.  Please show the CHEMOTHERAPY ALERT CARD or IMMUNOTHERAPY ALERT CARD at check-in to the  Emergency Department and triage nurse.  Should you have questions after your visit or need to cancel or reschedule your appointment, please contact [facility] [phone]  and follow the prompts.  Office hours are 8:00 a.m. to 4:30 p.m. Monday - Friday. Please note that voicemails left after 4:00 p.m. may not be returned until the following business day.  We are closed weekends and major holidays. You have access to a nurse at all times for urgent questions. Please call the main number to the clinic [phone] and follow the prompts.  For any non-urgent questions, you may also contact your provider using MyChart. We now offer e-Visits for anyone 18 and older to request care online for non-urgent symptoms. For details visit [email or web address].   Also download the MyChart app! Go to the app store, search "MyChart", open the app, select [facility], and log in with your MyChart username and password.  Masks are optional in the cancer centers. If you would like for your care team to wear a mask while they are taking care of you, please let them know. You may have one support person who is at least 81 years old accompany you for your appointments.  

## 2022-04-23 ENCOUNTER — Ambulatory Visit: Payer: Medicare Other | Admitting: Hematology

## 2022-04-23 ENCOUNTER — Other Ambulatory Visit: Payer: Medicare Other

## 2022-04-23 ENCOUNTER — Ambulatory Visit: Payer: Medicare Other

## 2022-05-06 ENCOUNTER — Other Ambulatory Visit: Payer: Self-pay | Admitting: *Deleted

## 2022-05-06 DIAGNOSIS — I514 Myocarditis, unspecified: Secondary | ICD-10-CM

## 2022-05-07 DIAGNOSIS — C7951 Secondary malignant neoplasm of bone: Secondary | ICD-10-CM | POA: Diagnosis not present

## 2022-05-07 DIAGNOSIS — C439 Malignant melanoma of skin, unspecified: Secondary | ICD-10-CM | POA: Diagnosis not present

## 2022-05-07 DIAGNOSIS — C7952 Secondary malignant neoplasm of bone marrow: Secondary | ICD-10-CM | POA: Diagnosis not present

## 2022-05-07 DIAGNOSIS — C4371 Malignant melanoma of right lower limb, including hip: Secondary | ICD-10-CM | POA: Diagnosis not present

## 2022-05-07 DIAGNOSIS — C7989 Secondary malignant neoplasm of other specified sites: Secondary | ICD-10-CM | POA: Diagnosis not present

## 2022-05-08 ENCOUNTER — Other Ambulatory Visit: Payer: Medicare Other

## 2022-05-08 ENCOUNTER — Ambulatory Visit: Payer: Medicare Other | Admitting: Hematology

## 2022-05-08 ENCOUNTER — Ambulatory Visit: Payer: Medicare Other

## 2022-05-13 ENCOUNTER — Inpatient Hospital Stay: Payer: Medicare Other

## 2022-05-13 ENCOUNTER — Inpatient Hospital Stay: Payer: Medicare Other | Admitting: Hematology

## 2022-05-13 ENCOUNTER — Inpatient Hospital Stay: Payer: Medicare Other | Attending: Hematology

## 2022-05-13 VITALS — BP 120/52 | HR 51 | Temp 97.8°F | Resp 18

## 2022-05-13 DIAGNOSIS — C4371 Malignant melanoma of right lower limb, including hip: Secondary | ICD-10-CM

## 2022-05-13 DIAGNOSIS — I514 Myocarditis, unspecified: Secondary | ICD-10-CM | POA: Insufficient documentation

## 2022-05-13 DIAGNOSIS — Z803 Family history of malignant neoplasm of breast: Secondary | ICD-10-CM | POA: Diagnosis not present

## 2022-05-13 DIAGNOSIS — Z5112 Encounter for antineoplastic immunotherapy: Secondary | ICD-10-CM | POA: Insufficient documentation

## 2022-05-13 DIAGNOSIS — Z79899 Other long term (current) drug therapy: Secondary | ICD-10-CM | POA: Diagnosis not present

## 2022-05-13 DIAGNOSIS — Z95828 Presence of other vascular implants and grafts: Secondary | ICD-10-CM

## 2022-05-13 DIAGNOSIS — Z800 Family history of malignant neoplasm of digestive organs: Secondary | ICD-10-CM | POA: Diagnosis not present

## 2022-05-13 DIAGNOSIS — C7951 Secondary malignant neoplasm of bone: Secondary | ICD-10-CM | POA: Diagnosis not present

## 2022-05-13 DIAGNOSIS — I10 Essential (primary) hypertension: Secondary | ICD-10-CM | POA: Insufficient documentation

## 2022-05-13 DIAGNOSIS — E039 Hypothyroidism, unspecified: Secondary | ICD-10-CM | POA: Diagnosis not present

## 2022-05-13 DIAGNOSIS — I409 Acute myocarditis, unspecified: Secondary | ICD-10-CM

## 2022-05-13 DIAGNOSIS — Z2989 Encounter for other specified prophylactic measures: Secondary | ICD-10-CM

## 2022-05-13 LAB — COMPREHENSIVE METABOLIC PANEL
ALT: 19 U/L (ref 0–44)
AST: 28 U/L (ref 15–41)
Albumin: 3.4 g/dL — ABNORMAL LOW (ref 3.5–5.0)
Alkaline Phosphatase: 114 U/L (ref 38–126)
Anion gap: 8 (ref 5–15)
BUN: 15 mg/dL (ref 8–23)
CO2: 29 mmol/L (ref 22–32)
Calcium: 8.8 mg/dL — ABNORMAL LOW (ref 8.9–10.3)
Chloride: 99 mmol/L (ref 98–111)
Creatinine, Ser: 0.87 mg/dL (ref 0.44–1.00)
GFR, Estimated: >60 mL/min (ref >60)
Glucose, Bld: 84 mg/dL (ref 70–99)
Potassium: 5 mmol/L (ref 3.5–5.1)
Sodium: 136 mmol/L (ref 135–145)
Total Bilirubin: 0.8 mg/dL (ref 0.3–1.2)
Total Protein: 6.5 g/dL (ref 6.5–8.1)

## 2022-05-13 LAB — CBC WITH DIFFERENTIAL/PLATELET
Abs Immature Granulocytes: 0.01 10*3/uL (ref 0.00–0.07)
Basophils Absolute: 0 10*3/uL (ref 0.0–0.1)
Basophils Relative: 1 %
Eosinophils Absolute: 0.2 10*3/uL (ref 0.0–0.5)
Eosinophils Relative: 4 %
HCT: 40 % (ref 36.0–46.0)
Hemoglobin: 13 g/dL (ref 12.0–15.0)
Immature Granulocytes: 0 %
Lymphocytes Relative: 29 %
Lymphs Abs: 1.2 10*3/uL (ref 0.7–4.0)
MCH: 29.7 pg (ref 26.0–34.0)
MCHC: 32.5 g/dL (ref 30.0–36.0)
MCV: 91.3 fL (ref 80.0–100.0)
Monocytes Absolute: 0.4 10*3/uL (ref 0.1–1.0)
Monocytes Relative: 10 %
Neutro Abs: 2.3 10*3/uL (ref 1.7–7.7)
Neutrophils Relative %: 56 %
Platelets: 140 10*3/uL — ABNORMAL LOW (ref 150–400)
RBC: 4.38 MIL/uL (ref 3.87–5.11)
RDW: 13.1 % (ref 11.5–15.5)
WBC: 4.2 10*3/uL (ref 4.0–10.5)
nRBC: 0 % (ref 0.0–0.2)

## 2022-05-13 LAB — LACTATE DEHYDROGENASE: LDH: 144 U/L (ref 98–192)

## 2022-05-13 LAB — TSH: TSH: 4.452 u[IU]/mL (ref 0.350–4.500)

## 2022-05-13 LAB — TROPONIN I (HIGH SENSITIVITY): Troponin I (High Sensitivity): 10 ng/L (ref <18)

## 2022-05-13 LAB — MAGNESIUM: Magnesium: 1.9 mg/dL (ref 1.7–2.4)

## 2022-05-13 LAB — BRAIN NATRIURETIC PEPTIDE: B Natriuretic Peptide: 301 pg/mL — ABNORMAL HIGH (ref 0.0–100.0)

## 2022-05-13 MED ORDER — ALTEPLASE 2 MG IJ SOLR
2.0000 mg | Freq: Once | INTRAMUSCULAR | Status: AC
  Administered 2022-05-13: 2 mg
  Filled 2022-05-13: qty 2

## 2022-05-13 MED ORDER — HEPARIN SOD (PORK) LOCK FLUSH 100 UNIT/ML IV SOLN
500.0000 [IU] | Freq: Once | INTRAVENOUS | Status: AC | PRN
  Administered 2022-05-13: 500 [IU]

## 2022-05-13 MED ORDER — SODIUM CHLORIDE 0.9% FLUSH
10.0000 mL | INTRAVENOUS | Status: DC | PRN
  Administered 2022-05-13: 10 mL

## 2022-05-13 MED ORDER — SODIUM CHLORIDE 0.9 % IV SOLN
200.0000 mg | Freq: Once | INTRAVENOUS | Status: AC
  Administered 2022-05-13: 200 mg via INTRAVENOUS
  Filled 2022-05-13: qty 8

## 2022-05-13 MED ORDER — SODIUM CHLORIDE 0.9 % IV SOLN: Freq: Once | INTRAVENOUS | Status: AC

## 2022-05-13 MED ORDER — SODIUM CHLORIDE 0.9% FLUSH
10.0000 mL | Freq: Once | INTRAVENOUS | Status: AC
  Administered 2022-05-13: 10 mL via INTRAVENOUS

## 2022-05-13 NOTE — Progress Notes (Signed)
Patient presents today for treatment and follow up visit with Dr. Delton Coombes. Labs reviewed by MD. Message received to proceed with treatment.   Treatment given today per MD orders. Tolerated infusion without adverse affects. Vital signs stable. No complaints at this time. Discharged from clinic ambulatory in stable condition. Alert and oriented x 3. F/U with [facility] as scheduled.

## 2022-05-13 NOTE — Patient Instructions (Signed)
[facility] Discharge Instructions   You were seen and examined today by Dr. Katragadda.  He reviewed the results of your lab work which are normal/stable.   We will proceed with your treatment today.  Return as scheduled.    Thank you for choosing [facility] at [facility] to provide your oncology and hematology care.  To afford each patient quality time with our provider, please arrive at least 15 minutes before your scheduled appointment time.   If you have a lab appointment with the Cancer Center please come in thru the Main Entrance and check in at the main information desk.  You need to re-schedule your appointment should you arrive 10 or more minutes late.  We strive to give you quality time with our providers, and arriving late affects you and other patients whose appointments are after yours.  Also, if you no show three or more times for appointments you may be dismissed from the clinic at the providers discretion.     Again, thank you for choosing [facility].  Our hope is that these requests will decrease the amount of time that you wait before being seen by our physicians.       _____________________________________________________________  Should you have questions after your visit to [facility], please contact our office at [phone] and follow the prompts.  Our office hours are 8:00 a.m. and 4:30 p.m. Monday - Friday.  Please note that voicemails left after 4:00 p.m. may not be returned until the following business day.  We are closed weekends and major holidays.  You do have access to a nurse 24-7, just call the main number to the clinic [phone] and do not press any options, hold on the line and a nurse will answer the phone.    For prescription refill requests, have your pharmacy contact our office and allow 72 hours.    Due to Covid, you will need to wear a mask upon entering  the hospital. If you do not have a mask, a mask will be given to you at the Main Entrance upon arrival. For doctor visits, patients may have 1 support person age 18 or older with them. For treatment visits, patients can not have anyone with them due to social distancing guidelines and our immunocompromised population.      

## 2022-05-13 NOTE — Progress Notes (Signed)
Elizabeth Norman, [place] [address]   CLINIC:  Medical Oncology/Hematology  PCP:  Neale Burly, MD [place] / [place] [address] [phone]   REASON FOR VISIT:  Follow-up for metastatic malignant melanoma, BRAF V600 negative  PRIOR THERAPY: none  NGS Results: BRAF negative, positive for NRAS pathogenic variant exon 3, TERT promoter.  MSI-stable.  MMR-proficient.  [place] 1/2/3 fusion not detected.  KIT mutation negative.  PD-L1 negative.  CURRENT THERAPY: Pembrolizumab (200) q21d Keytruda  BRIEF ONCOLOGIC HISTORY:  Oncology History  Malignant melanoma of lower leg, right ([place])  03/27/2021 Initial Diagnosis   Malignant melanoma of lower leg, right ([place])   05/09/2021 - 05/09/2021 Chemotherapy   Patient is on Treatment Plan : MELANOMA - [place] (nivolumab/relatlimab) q28d     09/03/2021 - 01/07/2022 Chemotherapy   Patient is on Treatment Plan : MELANOMA Pembrolizumab (200) q21d     09/03/2021 -  Chemotherapy   Patient is on Treatment Plan : MELANOMA Pembrolizumab (200) q21d       CANCER STAGING:  Cancer Staging  Malignant melanoma of lower leg, right ([facility]) Staging form: Melanoma of the Skin, AJCC 8th Edition - Clinical stage from 03/27/2021: Stage IV (cT4a, cN3, cM1) - Unsigned   INTERVAL HISTORY:  [name], a 82 y.o. female, seen for follow-up and toxicity assessment prior to her next cycle of metastatic melanoma.  Denies any new onset pains.  She has some burning sensation in the right thigh since radiation.  Lipitor has been on hold since last visit.  Does not report any chest pains.  REVIEW OF SYSTEMS:  Review of Systems  Constitutional:  Negative for appetite change and fatigue.  Psychiatric/Behavioral:  The patient is nervous/anxious.   All other systems reviewed and are negative.   PAST MEDICAL/SURGICAL HISTORY:  Past Medical History:  Diagnosis Date   Arthritis    Hypertension    Malignant melanoma of  lower leg, right ([facility]) 03/27/2021   Port-A-Cath in place 04/30/2021   Past Surgical History:  Procedure Laterality Date   CATARACT EXTRACTION W/PHACO Right 10/03/2014   Procedure: CATARACT EXTRACTION PHACO AND INTRAOCULAR LENS PLACEMENT ([facility]);  Surgeon: [name], MD;  Location: [facility];  Service: Ophthalmology;  Laterality: Right;  CDE:10.09   CATARACT EXTRACTION W/PHACO Left 10/10/2014   Procedure: CATARACT EXTRACTION PHACO AND INTRAOCULAR LENS PLACEMENT ([facility]);  Surgeon: [name], MD;  Location: [facility];  Service: Ophthalmology;  Laterality: Left;  CDE:8.69   CORNEAL 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   CORNEAL TRANSPLANT Left 2020   DENTAL RESTORATION/EXTRACTION WITH X-RAY     IR IMAGING GUIDED PORT INSERTION  04/05/2021   LEFT HEART CATH AND CORONARY ANGIOGRAPHY N/A 05/28/2021   Procedure: LEFT HEART CATH AND CORONARY ANGIOGRAPHY;  Surgeon: Nigel Mormon, MD;  Location: [facility];  Service: Cardiovascular;  Laterality: N/A;   MELANOMA EXCISION Right 02/12/2021   Procedure: WIDE LOCAL EXCISION RIGHT LOWER LEG MELANOMA , ADVANCEMENT FLAP CLOSURE DEFECT;  Surgeon: [name], MD;  Location: [facility];  Service: General;  Laterality: Right;   SENTINEL NODE BIOPSY Right 02/12/2021   Procedure: SENTINEL NODE BIOPSY;  Surgeon: [name], MD;  Location: [facility];  Service: General;  Laterality: Right;    SOCIAL HISTORY:  Social History   Socioeconomic History   Marital status: Widowed    Spouse name: Not on file   Number of children: 3   Years of education: Not on file   Highest  education level: Not on file  Occupational History   Not on file  Tobacco Use   Smoking status: Never   Smokeless tobacco: Never  Vaping Use   Vaping Use: Never used  Substance and Sexual Activity   Alcohol use: No   Drug use: No   Sexual activity: Not on file  Other Topics Concern   Not on file  Social History Narrative   Not on file   Social Determinants of Health   Financial Resource  Strain: Not on file  Food Insecurity: Not on file  Transportation Needs: Not on file  Physical Activity: Not on file  Stress: Not on file  Social Connections: Not on file  Intimate Partner Violence: Not on file    FAMILY HISTORY:  Family History  Problem Relation Age of Onset   Cancer Father    Hypertension Sister    Hypertension Sister     CURRENT MEDICATIONS:  Current Outpatient Medications  Medication Sig Dispense Refill   alendronate (FOSAMAX) 70 MG tablet Take 70 mg by mouth once a week.  0   ALPRAZolam (XANAX) 0.5 MG tablet Take 0.5 mg by mouth 3 (three) times daily.  2   aspirin EC 81 MG EC tablet Take 1 tablet (81 mg total) by mouth daily. Swallow whole. 30 tablet 11   atorvastatin (LIPITOR) 40 MG tablet Take 40 mg by mouth daily.     calcium carbonate (OS-CAL - DOSED IN MG OF ELEMENTAL CALCIUM) 1250 (500 CA) MG tablet Take 1 tablet by mouth 3 (three) times a week.     cholecalciferol (VITAMIN D) 1000 UNITS tablet Take 1,000 Units by mouth 3 (three) times a week.     Cyanocobalamin (VITAMIN B-12 PO) Take 1 tablet by mouth 3 (three) times a week.     levothyroxine (SYNTHROID) 100 MCG tablet Take 1 tablet by mouth daily before breakfast. 30 tablet 3   metoprolol tartrate (LOPRESSOR) 25 MG tablet Take 25 mg by mouth 2 (two) times daily.     nystatin (MYCOSTATIN) 100000 UNIT/ML suspension Take 10 mLs (1,000,000 Units total) by mouth 4 (four) times daily. Swish and spit 480 mL 1   prednisoLONE acetate (PRED FORTE) 1 % ophthalmic suspension Place 1 drop into both eyes See admin instructions. Instill one drop into the right eye twice daily and one drop into the left eye once daily.     No current facility-administered medications for this visit.    ALLERGIES:  Allergies  Allergen Reactions   Tape     Pulls skin, please use paper tape    PHYSICAL EXAM:  Performance status (ECOG): 0 - Asymptomatic  There were no vitals filed for this visit.   Wt Readings from Last 3  Encounters:  05/13/22 174 lb 9.6 oz (79.2 kg)  04/10/22 175 lb (79.4 kg)  03/12/22 172 lb 9.6 oz (78.3 kg)   Physical Exam Vitals reviewed.  Constitutional:      Appearance: Normal appearance.  Cardiovascular:     Rate and Rhythm: Normal rate and regular rhythm.     Pulses: Normal pulses.     Heart sounds: Normal heart sounds.  Pulmonary:     Breath sounds: Normal breath sounds.  Neurological:     General: No focal deficit present.     Mental Status: She is alert and oriented to person, place, and time.  Psychiatric:        Mood and Affect: Mood normal.        Behavior: Behavior normal.  LABORATORY DATA:  I have reviewed the labs as listed.     Latest Ref Rng & Units 04/21/2022    8:41 AM 04/10/2022    8:55 AM 03/12/2022    9:36 AM  CBC  WBC 4.0 - 10.5 K/uL 3.7  3.9  5.0   Hemoglobin 12.0 - 15.0 g/dL 12.3  12.1  12.4   Hematocrit 36.0 - 46.0 % 37.0  37.5  37.6   Platelets 150 - 400 K/uL 141  157  165       Latest Ref Rng & Units 04/21/2022    8:41 AM 04/10/2022    8:55 AM 03/12/2022    9:36 AM  CMP  Glucose 70 - 99 mg/dL 104  134  113   BUN 8 - 23 mg/dL _0 Creatinine 0.44 - 1.00 mg/dL 0.74  0.89  0.75   Sodium 135 - 145 mmol/L 135  134  134   Potassium 3.5 - 5.1 mmol/L 3.7  4.2  4.0   Chloride 98 - 111 mmol/L 101  99  99   CO2 22 - 32 mmol/L _1 Calcium 8.9 - 10.3 mg/dL 8.5  8.6  8.7   Total Protein 6.5 - 8.1 g/dL 6.6  6.8  6.9   Total Bilirubin 0.3 - 1.2 mg/dL 0.7  1.1  1.4   Alkaline Phos 38 - 126 U/L 202  438  310   AST 15 - 41 U/L 54  238  148   ALT 0 - 44 U/L 48  163  82     DIAGNOSTIC IMAGING:  I have independently reviewed the scans and discussed with the patient. No results found.   ASSESSMENT:  Malignant melanoma of right posterior leg (pT4 pN3 M1): - She noticed fleshy lesion 4 months ago on the right leg. - Her PMD did a biopsy which was consistent with melanoma. - Wide local excision and lymph node biopsy by Dr. Barry Dienes  on 02/12/2021. - Pathology margins free, nodular type, Breslow's thickness 9.52 mm, Clark level V, deep margins free, ulceration absent, satellitosis absent, mitotic index 2/millimeters squared, LVI negative.  Neurotropism absent.  Tumor infiltrating lymphocytes absent.  Tumor regression not noted.  3 out of 3 positive sentinel lymph nodes for melanoma. - PET CT scan on 03/22/2021 with 3 foci of intense radiotracer activity within the right lower extremity.  In the medial right upper thigh nodule just superficial to thigh musculature measuring 11 mm with SUV 7.6.  Intramedullary hypermetabolic activity within the shaft of the mid right femur with SUV 14.6.  Intense activity associated with condylar notch of the distal right femur, appears to localize to soft tissue rather than the bone.  More diffuse activity associated with the medial right calf related to excision biopsy.  There is a focus of activity adjacent to the lateral margin of the right iliac wing SUV 4.1.  No evidence of visceral metastatic disease in the chest, abdomen or pelvis. 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 - NGS testing-BRAF negative, positive for NRAS pathogenic variant exon 3, TERT promoter.  MSI-stable.  MMR-proficient.  [place] 1/2/3 fusion not detected.  KIT mutation negative.  PD-L1 negative. - Nivolumab-Relatlimab every 28 days started on 05/09/2021.  Held permanently after first dose due to myocarditis. - PET scan on  07/25/2021: Slight progression with no new sites of metastatic disease. - Pembrolizumab started on 09/03/2021.  XRT to the soft tissue nodule within the medial right thigh and midshaft right femoral lesion from 03/26/2022 through 04/09/2022.    Social/family history: - She is seen with her son today.  She lives by herself at home.  She is independent  of ADLs and IADLs.  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  She is non-smoker. - Father had colon cancer.  Sister had breast cancer and colon cancer.  Maternal aunt had breast cancer.  Paternal uncle had colon cancer.   PLAN:  Malignant melanoma of the right posterior leg (pT4 pN3 M1), BRAF V600 negative: - I have reviewed her labs today which showed normal LFTs.  Lipitor is on hold. - CBC and TSH are normal. - Proceed with treatment today.  RTC 3 weeks for follow-up.  Will continue to monitor troponin levels.  Will consider ordering another PET scan after 2 more cycles.  2.  History of immunotherapy myocarditis: - Troponin today is 10.  BNP is elevated at 301.  She does not have any signs or symptoms of chest pain.  3.  Right medial thigh pain: - Pain has improved.  She reports some burning sensation in the right thigh medially and laterally on and off.  4.  Hypothyroidism: - Continue Synthroid 100 mcg daily.  TSH is 4.4 today.   Orders placed this encounter:  No orders of the defined types were placed in this encounter.    Derek Jack, MD [place] [phone]

## 2022-05-13 NOTE — Patient Instructions (Signed)
[facility]  Discharge Instructions: Thank you for choosing [facility] to provide your oncology and hematology care.  If you have a lab appointment with the Cancer Center, please come in thru the Main Entrance and check in at the main information desk.  Wear comfortable clothing and clothing appropriate for easy access to any Portacath or PICC line.   We strive to give you quality time with your provider. You may need to reschedule your appointment if you arrive late (15 or more minutes).  Arriving late affects you and other patients whose appointments are after yours.  Also, if you miss three or more appointments without notifying the office, you may be dismissed from the clinic at the provider's discretion.      For prescription refill requests, have your pharmacy contact our office and allow 72 hours for refills to be completed.    Today you received the following chemotherapy and/or immunotherapy agents Keytruda.  Pembrolizumab Injection What is this medication? PEMBROLIZUMAB (PEM broe LIZ ue mab) treats some types of cancer. It works by helping your immune system slow or stop the spread of cancer cells. It is a monoclonal antibody. This medicine may be used for other purposes; ask your health care provider or pharmacist if you have questions. COMMON BRAND NAME(S): Keytruda What should I tell my care team before I take this medication? They need to know if you have any of these conditions: Allogeneic stem cell transplant (uses someone else's stem cells) Autoimmune diseases, such as Crohn disease, ulcerative colitis, lupus History of chest radiation Nervous system problems, such as Guillain-Barre syndrome, myasthenia gravis Organ transplant An unusual or allergic reaction to pembrolizumab, other medications, foods, dyes, or preservatives Pregnant or trying to get pregnant Breast-feeding How should I use this medication? This medication is injected  into a vein. It is given by your care team in a hospital or clinic setting. A special MedGuide will be given to you before each treatment. Be sure to read this information carefully each time. Talk to your care team about the use of this medication in children. While it may be prescribed for children as young as 6 months for selected conditions, precautions do apply. Overdosage: If you think you have taken too much of this medicine contact a poison control center or emergency room at once. NOTE: This medicine is only for you. Do not share this medicine with others. What if I miss a dose? Keep appointments for follow-up doses. It is important not to miss your dose. Call your care team if you are unable to keep an appointment. What may interact with this medication? Interactions have not been studied. This list may not describe all possible interactions. Give your health care provider a list of all the medicines, herbs, non-prescription drugs, or dietary supplements you use. Also tell them if you smoke, drink alcohol, or use illegal drugs. Some items may interact with your medicine. What should I watch for while using this medication? Your condition will be monitored carefully while you are receiving this medication. You may need blood work while taking this medication. This medication may cause serious skin reactions. They can happen weeks to months after starting the medication. Contact your care team right away if you notice fevers or flu-like symptoms with a rash. The rash may be red or purple and then turn into blisters or peeling of the skin. You may also notice a red rash with swelling of the face, lips, or lymph   nodes in your neck or under your arms. Tell your care team right away if you have any change in your eyesight. Talk to your care team if you may be pregnant. Serious birth defects can occur if you take this medication during pregnancy and for 4 months after the last dose. You will need a  negative pregnancy test before starting this medication. Contraception is recommended while taking this medication and for 4 months after the last dose. Your care team can help you find the option that works for you. Do not breastfeed while taking this medication and for 4 months after the last dose. What side effects may I notice from receiving this medication? Side effects that you should report to your care team as soon as possible: Allergic reactions--skin rash, itching, hives, swelling of the face, lips, tongue, or throat Dry cough, shortness of breath or trouble breathing Eye pain, redness, irritation, or discharge with blurry or decreased vision Heart muscle inflammation--unusual weakness or fatigue, shortness of breath, chest pain, fast or irregular heartbeat, dizziness, swelling of the ankles, feet, or hands Hormone gland problems--headache, sensitivity to light, unusual weakness or fatigue, dizziness, fast or irregular heartbeat, increased sensitivity to cold or heat, excessive sweating, constipation, hair loss, increased thirst or amount of urine, tremors or shaking, irritability Infusion reactions--chest pain, shortness of breath or trouble breathing, feeling faint or lightheaded Kidney injury (glomerulonephritis)--decrease in the amount of urine, red or dark brown urine, foamy or bubbly urine, swelling of the ankles, hands, or feet Liver injury--right upper belly pain, loss of appetite, nausea, light-colored stool, dark yellow or brown urine, yellowing skin or eyes, unusual weakness or fatigue Pain, tingling, or numbness in the hands or feet, muscle weakness, change in vision, confusion or trouble speaking, loss of balance or coordination, trouble walking, seizures Rash, fever, and swollen lymph nodes Redness, blistering, peeling, or loosening of the skin, including inside the mouth Sudden or severe stomach pain, bloody diarrhea, fever, nausea, vomiting Side effects that usually do not  require medical attention (report to your care team if they continue or are bothersome): Bone, joint, or muscle pain Diarrhea Fatigue Loss of appetite Nausea Skin rash This list may not describe all possible side effects. Call your doctor for medical advice about side effects. You may report side effects to FDA at 1-800-FDA-1088. Where should I keep my medication? This medication is given in a hospital or clinic. It will not be stored at home. NOTE: This sheet is a summary. It may not cover all possible information. If you have questions about this medicine, talk to your doctor, pharmacist, or health care provider.  2023 Elsevier/Gold Standard (2013-01-17 00:00:00)       To help prevent nausea and vomiting after your treatment, we encourage you to take your nausea medication as directed.  BELOW ARE SYMPTOMS THAT SHOULD BE REPORTED IMMEDIATELY: *FEVER GREATER THAN 100.4 F (38 C) OR HIGHER *CHILLS OR SWEATING *NAUSEA AND VOMITING THAT IS NOT CONTROLLED WITH YOUR NAUSEA MEDICATION *UNUSUAL SHORTNESS OF BREATH *UNUSUAL BRUISING OR BLEEDING *URINARY PROBLEMS (pain or burning when urinating, or frequent urination) *BOWEL PROBLEMS (unusual diarrhea, constipation, pain near the anus) TENDERNESS IN MOUTH AND THROAT WITH OR WITHOUT PRESENCE OF ULCERS (sore throat, sores in mouth, or a toothache) UNUSUAL RASH, SWELLING OR PAIN  UNUSUAL VAGINAL DISCHARGE OR ITCHING   Items with * indicate a potential emergency and should be followed up as soon as possible or go to the Emergency Department if any problems should occur.  Please   show the CHEMOTHERAPY ALERT CARD or IMMUNOTHERAPY ALERT CARD at check-in to the Emergency Department and triage nurse.  Should you have questions after your visit or need to cancel or reschedule your appointment, please contact [facility] [phone]  and follow the prompts.  Office hours are 8:00 a.m. to 4:30 p.m. Monday - Friday. Please note that  voicemails left after 4:00 p.m. may not be returned until the following business day.  We are closed weekends and major holidays. You have access to a nurse at all times for urgent questions. Please call the main number to the clinic [phone] and follow the prompts.  For any non-urgent questions, you may also contact your provider using MyChart. We now offer e-Visits for anyone 18 and older to request care online for non-urgent symptoms. For details visit [email or web address].   Also download the MyChart app! Go to the app store, search "MyChart", open the app, select [facility], and log in with your MyChart username and password.   

## 2022-05-13 NOTE — Progress Notes (Signed)
Patient has been examined by Dr. Katragadda, and vital signs and labs have been reviewed. ANC, Creatinine, LFTs, hemoglobin, and platelets are within treatment parameters per M.D. - pt may proceed with treatment.  Primary RN and pharmacy notified.  

## 2022-05-16 ENCOUNTER — Other Ambulatory Visit: Payer: Self-pay

## 2022-05-21 IMAGING — MR MR CARD MORPHOLOGY WO/W CM
45 of 48 series · 45 of 48 positions shown · IV contrast (gadavist)
Comparison: none

CLINICAL DATA: Elevated Troponin r/o Myocarditis

EXAM:
CARDIAC MRI
TECHNIQUE: The patient was scanned on a 1.5 Tesla Siemens magnet. A dedicated
cardiac coil was used. Functional imaging was done using Fiesta
sequences. [DATE], and 4 chamber views were done to assess for RWMA's.
Modified Zy rule using a short axis stack was used to
calculate an ejection fraction on a dedicated work station using
Circle software. The patient received 8 cc of Gadavist. After 10
minutes inversion recovery sequences were used to assess for
infiltration and scar tissue.
CONTRAST:  Gadavist

[Series 4: [id number]_bh · axial · 8.0mm · 1.41mm/px · 1 of 16 slices shown]
[im 1/16]
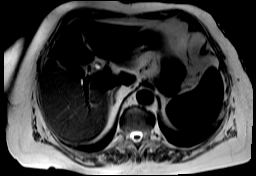

[Series 12: bSSFP · oblique · 8.0mm · 1.61mm/px · 1 of 25 slices shown (1 of 17)]
[im 1/25]
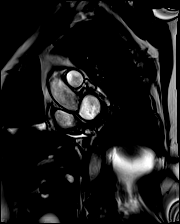

[Series 13: bSSFP · oblique · 8.0mm · 1.61mm/px · 1 of 25 slices shown (2 of 17)]
[im 1/25]
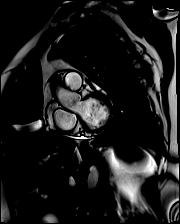

[Series 14: bSSFP · oblique · 8.0mm · 1.61mm/px · 1 of 25 slices shown (3 of 17)]
[im 1/25]
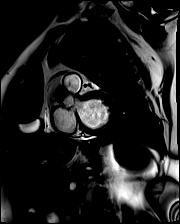

[Series 15: bSSFP · oblique · 8.0mm · 1.61mm/px · 1 of 25 slices shown (4 of 17)]
[im 1/25]
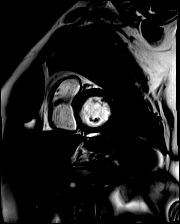

[Series 16: bSSFP · oblique · 8.0mm · 1.61mm/px · 1 of 25 slices shown (5 of 17)]
[im 1/25]
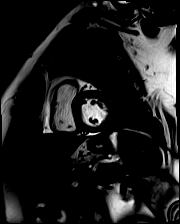

[Series 17: bSSFP · oblique · 8.0mm · 1.61mm/px · 1 of 25 slices shown (6 of 17)]
[im 1/25]
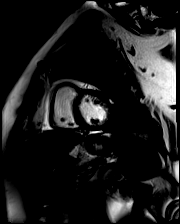

[Series 18: bSSFP · oblique · 8.0mm · 1.61mm/px · 1 of 25 slices shown (7 of 17)]
[im 1/25]
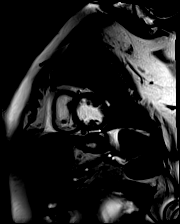

[Series 19: bSSFP · oblique · 8.0mm · 1.61mm/px · 1 of 25 slices shown (8 of 17)]
[im 1/25]
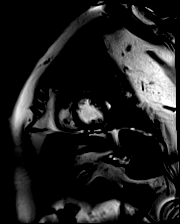

[Series 20: bSSFP · oblique · 8.0mm · 1.61mm/px · 1 of 25 slices shown (9 of 17)]
[im 1/25]
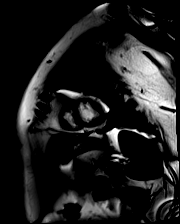

[Series 21: bSSFP · oblique · 8.0mm · 1.61mm/px · 1 of 25 slices shown (10 of 17)]
[im 1/25]
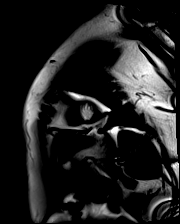

[Series 22: bSSFP · oblique · 8.0mm · 1.61mm/px · 1 of 25 slices shown (11 of 17)]
[im 1/25]
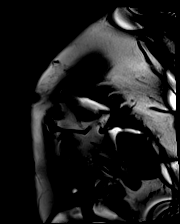

[Series 23: bSSFP · oblique · 8.0mm · 1.61mm/px · 1 of 25 slices shown (12 of 17)]
[im 1/25]
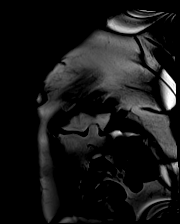

[Series 24: bSSFP · oblique · 8.0mm · 1.61mm/px · 1 of 25 slices shown (13 of 17)]
[im 1/25]
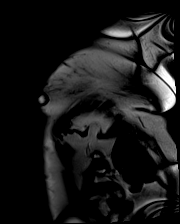

[Series 25: bSSFP · oblique · 6.0mm · 1.41mm/px · 1 of 25 slices shown (14 of 17)]
[im 1/25]
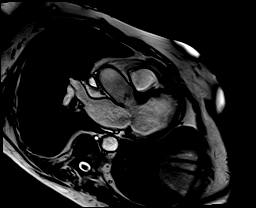

[Series 26: bSSFP · oblique · 6.0mm · 1.41mm/px · 1 of 25 slices shown (15 of 17)]
[im 1/25]
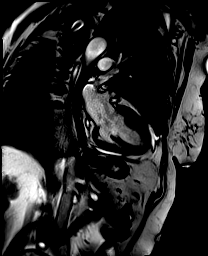

[Series 27: bSSFP · axial · 6.0mm · 1.41mm/px · 1 of 25 slices shown (16 of 17)]
[im 1/25]
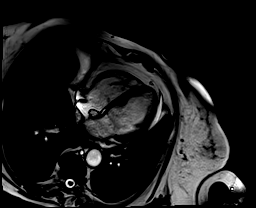

[Series 28: STIR · oblique · 8.0mm · 1.92mm/px · 1 of 13 slices shown]
[im 1/13]
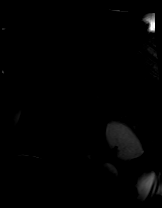

[Series 29: (id)_long_t1 · oblique · 8.0mm · 1.56mm/px · 1 of 24 slices shown]
[im 1/24]
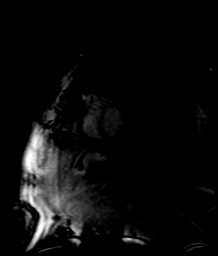

[Series 30: (id)_long_t1_moco · oblique · 8.0mm · 1.56mm/px · 1 of 24 slices shown]
[im 1/24]
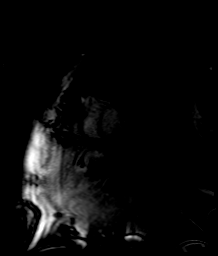

[Series 31: (id)_long_t1_moco_t1 · 1 of 3 slices shown]
[im 1/3]
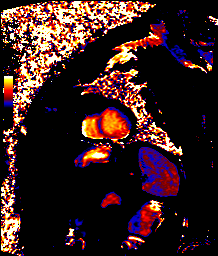

[Series 33: (id)_trufi · oblique · 8.0mm · 2.08mm/px · 1 of 9 slices shown]
[im 1/9]
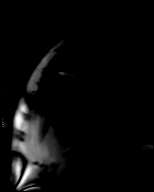

[Series 34: (id)_trufi_moco · oblique · 8.0mm · 2.08mm/px · 1 of 9 slices shown]
[im 1/9]
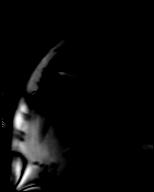

[Series 37: pre short axis · oblique · non-contrast · 8.0mm · 2.25mm/px · 1 of 10 slices shown (1 of 12)]
[im 1/10]
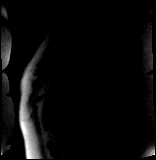

[Series 38: pre short axis · oblique · non-contrast · 8.0mm · 2.25mm/px · 1 of 10 slices shown (2 of 12)]
[im 1/10]
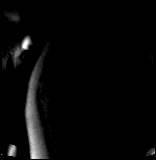

[Series 39: pre short axis · oblique · non-contrast · 8.0mm · 2.25mm/px · 1 of 10 slices shown (3 of 12)]
[im 1/10]
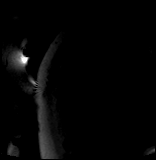

[Series 40: pre short axis · oblique · non-contrast · 8.0mm · 2.25mm/px · 1 of 10 slices shown (4 of 12)]
[im 1/10]
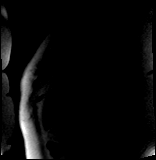

[Series 41: pre short axis · oblique · non-contrast · 8.0mm · 2.25mm/px · 1 of 10 slices shown (5 of 12)]
[im 1/10]
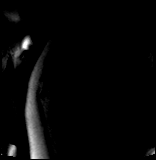

[Series 42: pre short axis · oblique · non-contrast · 8.0mm · 2.25mm/px · 1 of 10 slices shown (6 of 12)]
[im 1/10]
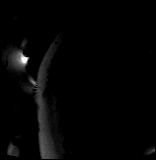

[Series 43: pre short axis · oblique · non-contrast · 8.0mm · 2.50mm/px · 1 of 10 slices shown (7 of 12)]
[im 1/10]
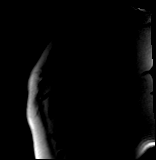

[Series 44: pre short axis · oblique · non-contrast · 8.0mm · 2.50mm/px · 1 of 10 slices shown (8 of 12)]
[im 1/10]
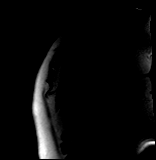

[Series 45: pre short axis · oblique · non-contrast · 8.0mm · 2.50mm/px · 1 of 10 slices shown (9 of 12)]
[im 1/10]
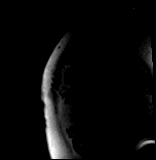

[Series 46: pre short axis · oblique · non-contrast · 8.0mm · 2.50mm/px · 1 of 10 slices shown (10 of 12)]
[im 1/10]
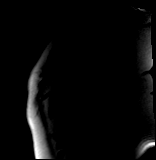

[Series 47: pre short axis · oblique · non-contrast · 8.0mm · 2.50mm/px · 1 of 10 slices shown (11 of 12)]
[im 1/10]
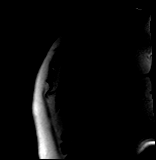

[Series 48: pre short axis · oblique · non-contrast · 8.0mm · 2.50mm/px · 1 of 10 slices shown (12 of 12)]
[im 1/10]
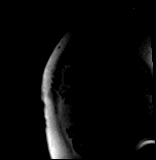

[Series 49: rest short axis · oblique · 8.0mm · 2.50mm/px · 1 of 60 slices shown (1 of 6)]
[im 1/60]
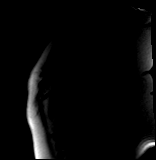

[Series 50: rest short axis · oblique · 8.0mm · 2.50mm/px · 1 of 60 slices shown (2 of 6)]
[im 1/60]
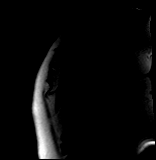

[Series 51: rest short axis · oblique · 8.0mm · 2.50mm/px · 1 of 60 slices shown (3 of 6)]
[im 1/60]
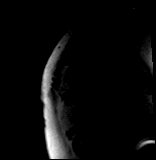

[Series 52: rest short axis · oblique · 8.0mm · 2.50mm/px · 1 of 60 slices shown (4 of 6)]
[im 1/60]
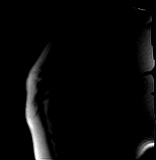

[Series 53: rest short axis · oblique · 8.0mm · 2.50mm/px · 1 of 60 slices shown (5 of 6)]
[im 1/60]
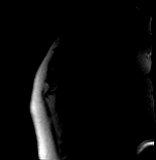

[Series 54: rest short axis · oblique · 8.0mm · 2.50mm/px · 1 of 60 slices shown (6 of 6)]
[im 1/60]
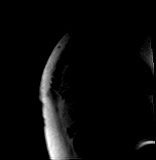

[Series 55: bSSFP · coronal · 6.0mm · 1.41mm/px · 1 of 25 slices shown (17 of 17)]
[im 1/25]
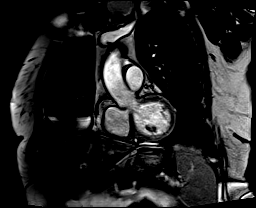

[Series 56: aortic valve cine · oblique · 6.0mm · 1.41mm/px · 1 of 25 slices shown (1 of 2)]
[im 1/25]
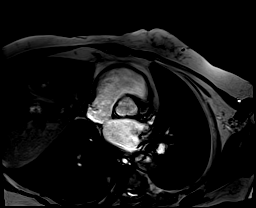

[Series 57: cine rvit · coronal · 6.0mm · 1.41mm/px · 1 of 25 slices shown]
[im 1/25]
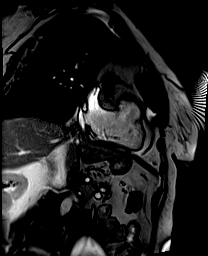

[Series 58: aortic valve cine · oblique · 6.0mm · 1.41mm/px · 1 of 25 slices shown (2 of 2)]
[im 1/25]
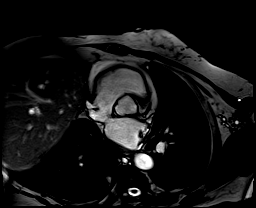

[45 of 48 positions shown; findings below may reference images not displayed]

FINDINGS: Normal atrial sizes. No ASD/PFO. Normal ascending aortic root 3.1 cm
The aortic valve is tri leaflet There is SHAMYRAT and prolapse of the
posterior mitral leaflet with mild appearing MR

There is normal RV size and function

Overall LV EF preserved 56% (EDV 122 cc ESV 54 cc SV 68 cc )

No discrete RWMA

Delayed gadolinium images showed focal uptake in the mid myocardium
of the basal lateral wall and pericardium in this area

T2 was elevated in this area 60.5 msec meeting Idriss Badda Siman Mahamed Criteria
for myocarditis in setting of heart cath with no epicardial cAD
IMPRESSION: 1. Focal gadolinium uptake in basal inferior lateral wall with
pericardial involvement Elevated T2 in this region consistent with
myocarditis

2.  Overall normal EF with no defined RWMA EF 57%

3.  Posterior MV leaflet prolapse with mild appearing MR

4.  Normal AV and aortic root 3.1 cm

5.  Normal RV size and function

6.  No pericardial effusion

Nettu Vanga

## 2022-06-05 ENCOUNTER — Inpatient Hospital Stay: Payer: Medicare Other

## 2022-06-05 ENCOUNTER — Inpatient Hospital Stay (HOSPITAL_BASED_OUTPATIENT_CLINIC_OR_DEPARTMENT_OTHER): Payer: Medicare Other | Admitting: Hematology

## 2022-06-05 VITALS — BP 146/62 | HR 61 | Temp 97.6°F | Resp 20 | Wt 171.0 lb

## 2022-06-05 VITALS — BP 98/71 | HR 58 | Temp 97.7°F | Resp 20

## 2022-06-05 DIAGNOSIS — Z2989 Encounter for other specified prophylactic measures: Secondary | ICD-10-CM

## 2022-06-05 DIAGNOSIS — Z95828 Presence of other vascular implants and grafts: Secondary | ICD-10-CM

## 2022-06-05 DIAGNOSIS — Z79899 Other long term (current) drug therapy: Secondary | ICD-10-CM | POA: Diagnosis not present

## 2022-06-05 DIAGNOSIS — Z5112 Encounter for antineoplastic immunotherapy: Secondary | ICD-10-CM | POA: Diagnosis not present

## 2022-06-05 DIAGNOSIS — C4371 Malignant melanoma of right lower limb, including hip: Secondary | ICD-10-CM

## 2022-06-05 DIAGNOSIS — Z800 Family history of malignant neoplasm of digestive organs: Secondary | ICD-10-CM | POA: Diagnosis not present

## 2022-06-05 DIAGNOSIS — I514 Myocarditis, unspecified: Secondary | ICD-10-CM

## 2022-06-05 DIAGNOSIS — Z803 Family history of malignant neoplasm of breast: Secondary | ICD-10-CM | POA: Diagnosis not present

## 2022-06-05 DIAGNOSIS — I409 Acute myocarditis, unspecified: Secondary | ICD-10-CM

## 2022-06-05 DIAGNOSIS — C7951 Secondary malignant neoplasm of bone: Secondary | ICD-10-CM | POA: Diagnosis not present

## 2022-06-05 DIAGNOSIS — E039 Hypothyroidism, unspecified: Secondary | ICD-10-CM | POA: Diagnosis not present

## 2022-06-05 DIAGNOSIS — I10 Essential (primary) hypertension: Secondary | ICD-10-CM | POA: Diagnosis not present

## 2022-06-05 LAB — COMPREHENSIVE METABOLIC PANEL
ALT: 14 U/L (ref 0–44)
AST: 24 U/L (ref 15–41)
Albumin: 3.7 g/dL (ref 3.5–5.0)
Alkaline Phosphatase: 82 U/L (ref 38–126)
Anion gap: 8 (ref 5–15)
BUN: 16 mg/dL (ref 8–23)
CO2: 27 mmol/L (ref 22–32)
Calcium: 8.7 mg/dL — ABNORMAL LOW (ref 8.9–10.3)
Chloride: 98 mmol/L (ref 98–111)
Creatinine, Ser: 0.82 mg/dL (ref 0.44–1.00)
GFR, Estimated: >60 mL/min (ref >60)
Glucose, Bld: 111 mg/dL — ABNORMAL HIGH (ref 70–99)
Potassium: 4.1 mmol/L (ref 3.5–5.1)
Sodium: 133 mmol/L — ABNORMAL LOW (ref 135–145)
Total Bilirubin: 0.7 mg/dL (ref 0.3–1.2)
Total Protein: 6.7 g/dL (ref 6.5–8.1)

## 2022-06-05 LAB — CBC WITH DIFFERENTIAL/PLATELET
Abs Immature Granulocytes: 0.01 10*3/uL (ref 0.00–0.07)
Basophils Absolute: 0 10*3/uL (ref 0.0–0.1)
Basophils Relative: 1 %
Eosinophils Absolute: 0.1 10*3/uL (ref 0.0–0.5)
Eosinophils Relative: 2 %
HCT: 39 % (ref 36.0–46.0)
Hemoglobin: 12.9 g/dL (ref 12.0–15.0)
Immature Granulocytes: 0 %
Lymphocytes Relative: 27 %
Lymphs Abs: 1.2 10*3/uL (ref 0.7–4.0)
MCH: 29.8 pg (ref 26.0–34.0)
MCHC: 33.1 g/dL (ref 30.0–36.0)
MCV: 90.1 fL (ref 80.0–100.0)
Monocytes Absolute: 0.4 10*3/uL (ref 0.1–1.0)
Monocytes Relative: 9 %
Neutro Abs: 2.9 10*3/uL (ref 1.7–7.7)
Neutrophils Relative %: 61 %
Platelets: 147 10*3/uL — ABNORMAL LOW (ref 150–400)
RBC: 4.33 MIL/uL (ref 3.87–5.11)
RDW: 12.9 % (ref 11.5–15.5)
WBC: 4.6 10*3/uL (ref 4.0–10.5)
nRBC: 0 % (ref 0.0–0.2)

## 2022-06-05 LAB — MAGNESIUM: Magnesium: 1.9 mg/dL (ref 1.7–2.4)

## 2022-06-05 LAB — BRAIN NATRIURETIC PEPTIDE: B Natriuretic Peptide: 184 pg/mL — ABNORMAL HIGH (ref 0.0–100.0)

## 2022-06-05 LAB — TSH: TSH: 3.365 u[IU]/mL (ref 0.350–4.500)

## 2022-06-05 LAB — TROPONIN I (HIGH SENSITIVITY): Troponin I (High Sensitivity): 11 ng/L (ref <18)

## 2022-06-05 LAB — LACTATE DEHYDROGENASE: LDH: 137 U/L (ref 98–192)

## 2022-06-05 MED ORDER — SODIUM CHLORIDE 0.9 % IV SOLN: Freq: Once | INTRAVENOUS | Status: AC

## 2022-06-05 MED ORDER — HEPARIN SOD (PORK) LOCK FLUSH 100 UNIT/ML IV SOLN
500.0000 [IU] | Freq: Once | INTRAVENOUS | Status: AC | PRN
  Administered 2022-06-05: 500 [IU]

## 2022-06-05 MED ORDER — SODIUM CHLORIDE 0.9 % IV SOLN
200.0000 mg | Freq: Once | INTRAVENOUS | Status: AC
  Administered 2022-06-05: 200 mg via INTRAVENOUS
  Filled 2022-06-05: qty 8

## 2022-06-05 MED ORDER — SODIUM CHLORIDE 0.9% FLUSH
10.0000 mL | INTRAVENOUS | Status: DC | PRN
  Administered 2022-06-05: 10 mL

## 2022-06-05 NOTE — Progress Notes (Signed)
Elizabeth Norman, Elizabeth Norman [id number]   CLINIC:  Medical Oncology/Hematology  PCP:  Elizabeth Burly, MD [place] / [place] [address] [phone]   REASON FOR VISIT:  Follow-up for metastatic malignant melanoma, BRAF V600 negative  PRIOR THERAPY: none  NGS Results: BRAF negative, positive for NRAS pathogenic variant exon 3, TERT promoter.  MSI-stable.  MMR-proficient.  [place] 1/2/3 fusion not detected.  KIT mutation negative.  PD-L1 negative.  CURRENT THERAPY: Pembrolizumab (200) q21d Keytruda  BRIEF ONCOLOGIC HISTORY:  Oncology History  Malignant melanoma of lower leg, right ([place])  03/27/2021 Initial Diagnosis   Malignant melanoma of lower leg, right ([place])   05/09/2021 - 05/09/2021 Chemotherapy   Patient is on Treatment Plan : MELANOMA - [place] (nivolumab/relatlimab) q28d     09/03/2021 - 01/07/2022 Chemotherapy   Patient is on Treatment Plan : MELANOMA Pembrolizumab (200) q21d     09/03/2021 -  Chemotherapy   Patient is on Treatment Plan : MELANOMA Pembrolizumab (200) q21d       CANCER STAGING:  Cancer Staging  Malignant melanoma of lower leg, right ([place]) Staging form: Melanoma of the Skin, AJCC 8th Edition - Clinical stage from 03/27/2021: Stage IV (cT4a, cN3, cM1) - Unsigned   INTERVAL HISTORY:  [name], a 82 y.o. female, seen for follow-up and toxicity assessment prior to next cycle of metastatic melanoma.  Denies any diarrhea, skin rashes.  Pain in the right thigh has been stable.  Energy levels are 85%.  REVIEW OF SYSTEMS:  Review of Systems  Constitutional:  Negative for appetite change and fatigue.  Psychiatric/Behavioral:  The patient is nervous/anxious.   All other systems reviewed and are negative.   PAST MEDICAL/SURGICAL HISTORY:  Past Medical History:  Diagnosis Date   Arthritis    Hypertension    Malignant melanoma of lower leg, right ([place]) 03/27/2021   Port-A-Cath in place 04/30/2021   Past  Surgical History:  Procedure Laterality Date   CATARACT EXTRACTION W/PHACO Right 10/03/2014   Procedure: CATARACT EXTRACTION PHACO AND INTRAOCULAR LENS PLACEMENT ([place]);  Surgeon: [name], MD;  Location: [facility];  Service: Ophthalmology;  Laterality: Right;  CDE:10.09   CATARACT EXTRACTION W/PHACO Left 10/10/2014   Procedure: CATARACT EXTRACTION PHACO AND INTRAOCULAR LENS PLACEMENT ([facility]);  Surgeon: [name], MD;  Location: [facility];  Service: Ophthalmology;  Laterality: Left;  CDE:8.69   CORNEAL 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   CORNEAL TRANSPLANT Left 2020   DENTAL RESTORATION/EXTRACTION WITH X-RAY     IR IMAGING GUIDED PORT INSERTION  04/05/2021   LEFT HEART CATH AND CORONARY ANGIOGRAPHY N/A 05/28/2021   Procedure: LEFT HEART CATH AND CORONARY ANGIOGRAPHY;  Surgeon: Nigel Mormon, MD;  Location: [facility];  Service: Cardiovascular;  Laterality: N/A;   MELANOMA EXCISION Right 02/12/2021   Procedure: WIDE LOCAL EXCISION RIGHT LOWER LEG MELANOMA , ADVANCEMENT FLAP CLOSURE DEFECT;  Surgeon: [name], MD;  Location: [facility];  Service: General;  Laterality: Right;   SENTINEL NODE BIOPSY Right 02/12/2021   Procedure: SENTINEL NODE BIOPSY;  Surgeon: [name], MD;  Location: [facility];  Service: General;  Laterality: Right;    SOCIAL HISTORY:  Social History   Socioeconomic History   Marital status: Widowed    Spouse name: Not on file   Number of children: 3   Years of education: Not on file   Highest education level: Not on file  Occupational History   Not on file  Tobacco  Use   Smoking status: Never   Smokeless tobacco: Never  Vaping Use   Vaping Use: Never used  Substance and Sexual Activity   Alcohol use: No   Drug use: No   Sexual activity: Not on file  Other Topics Concern   Not on file  Social History Narrative   Not on file   Social Determinants of Health   Financial Resource Strain: Not on file  Food Insecurity: Not on file  Transportation Needs: Not on  file  Physical Activity: Not on file  Stress: Not on file  Social Connections: Not on file  Intimate Partner Violence: Not on file    FAMILY HISTORY:  Family History  Problem Relation Age of Onset   Cancer Father    Hypertension Sister    Hypertension Sister     CURRENT MEDICATIONS:  Current Outpatient Medications  Medication Sig Dispense Refill   alendronate (FOSAMAX) 70 MG tablet Take 70 mg by mouth once a week.  0   ALPRAZolam (XANAX) 0.5 MG tablet Take 0.5 mg by mouth 3 (three) times daily.  2   aspirin EC 81 MG EC tablet Take 1 tablet (81 mg total) by mouth daily. Swallow whole. 30 tablet 11   calcium carbonate (OS-CAL - DOSED IN MG OF ELEMENTAL CALCIUM) 1250 (500 CA) MG tablet Take 1 tablet by mouth 3 (three) times a week.     cholecalciferol (VITAMIN D) 1000 UNITS tablet Take 1,000 Units by mouth 3 (three) times a week.     Cyanocobalamin (VITAMIN B-12 PO) Take 1 tablet by mouth 3 (three) times a week.     levothyroxine (SYNTHROID) 100 MCG tablet Take 1 tablet by mouth daily before breakfast. 30 tablet 3   metoprolol tartrate (LOPRESSOR) 25 MG tablet Take 25 mg by mouth 2 (two) times daily.     nystatin (MYCOSTATIN) 100000 UNIT/ML suspension Take 10 mLs (1,000,000 Units total) by mouth 4 (four) times daily. Swish and spit 480 mL 1   prednisoLONE acetate (PRED FORTE) 1 % ophthalmic suspension Place 1 drop into both eyes See admin instructions. Instill one drop into the right eye twice daily and one drop into the left eye once daily.     No current facility-administered medications for this visit.    ALLERGIES:  Allergies  Allergen Reactions   Tape     Pulls skin, please use paper tape    PHYSICAL EXAM:  Performance status (ECOG): 0 - Asymptomatic  Vitals:   06/05/22 0831  BP: (!) 146/62  Pulse: 61  Resp: 20  Temp: 97.6 F (36.4 C)  SpO2: 98%     Wt Readings from Last 3 Encounters:  06/05/22 171 lb (77.6 kg)  05/13/22 174 lb 9.6 oz (79.2 kg)  04/10/22 175  lb (79.4 kg)   Physical Exam Vitals reviewed.  Constitutional:      Appearance: Normal appearance.  Cardiovascular:     Rate and Rhythm: Normal rate and regular rhythm.     Pulses: Normal pulses.     Heart sounds: Normal heart sounds.  Pulmonary:     Breath sounds: Normal breath sounds.  Neurological:     General: No focal deficit present.     Mental Status: She is alert and oriented to person, place, and time.  Psychiatric:        Mood and Affect: Mood normal.        Behavior: Behavior normal.      LABORATORY DATA:  I have reviewed the labs as  listed.     Latest Ref Rng & Units 06/05/2022    8:18 AM 05/13/2022    8:49 AM 04/21/2022    8:41 AM  CBC  WBC 4.0 - 10.5 K/uL 4.6  4.2  3.7   Hemoglobin 12.0 - 15.0 g/dL 12.9  13.0  12.3   Hematocrit 36.0 - 46.0 % 39.0  40.0  37.0   Platelets 150 - 400 K/uL 147  140  141       Latest Ref Rng & Units 06/05/2022    8:18 AM 05/13/2022    8:49 AM 04/21/2022    8:41 AM  CMP  Glucose 70 - 99 mg/dL 111  84  104   BUN 8 - 23 mg/dL '16  15  11   '$ Creatinine 0.44 - 1.00 mg/dL 0.82  0.87  0.74   Sodium 135 - 145 mmol/L 133  136  135   Potassium 3.5 - 5.1 mmol/L 4.1  5.0  3.7   Chloride 98 - 111 mmol/L 98  99  101   CO2 22 - 32 mmol/L '27  29  28   '$ Calcium 8.9 - 10.3 mg/dL 8.7  8.8  8.5   Total Protein 6.5 - 8.1 g/dL 6.7  6.5  6.6   Total Bilirubin 0.3 - 1.2 mg/dL 0.7  0.8  0.7   Alkaline Phos 38 - 126 U/L 82  114  202   AST 15 - 41 U/L 24  28  54   ALT 0 - 44 U/L 14  19  48     DIAGNOSTIC IMAGING:  I have independently reviewed the scans and discussed with the patient. No results found.   ASSESSMENT:  Malignant melanoma of right posterior leg (pT4 pN3 M1): - She noticed fleshy lesion 4 months ago on the right leg. - Her PMD did a biopsy which was consistent with melanoma. - Wide local excision and lymph node biopsy by Dr. Barry Dienes on 02/12/2021. - Pathology margins free, nodular type, Breslow's thickness 9.52 mm, Clark level V, deep  margins free, ulceration absent, satellitosis absent, mitotic index 2/millimeters squared, LVI negative.  Neurotropism absent.  Tumor infiltrating lymphocytes absent.  Tumor regression not noted.  3 out of 3 positive sentinel lymph nodes for melanoma. - PET CT scan on 03/22/2021 with 3 foci of intense radiotracer activity within the right lower extremity.  In the medial right upper thigh nodule just superficial to thigh musculature measuring 11 mm with SUV 7.6.  Intramedullary hypermetabolic activity within the shaft of the mid right femur with SUV 14.6.  Intense activity associated with condylar notch of the distal right femur, appears to localize to soft tissue rather than the bone.  More diffuse activity associated with the medial right calf related to excision biopsy.  There is a focus of activity adjacent to the lateral margin of the right iliac wing SUV 4.1.  No evidence of visceral metastatic disease in the chest, abdomen or pelvis. 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 - NGS testing-BRAF negative, positive for NRAS pathogenic variant exon 3, TERT promoter.  MSI-stable.  MMR-proficient.  [place] 1/2/3 fusion not detected.  KIT mutation negative.  PD-L1 negative. - Nivolumab-Relatlimab every 28 days started on 05/09/2021.  Held permanently after first dose due to myocarditis. - PET scan on 07/25/2021: Slight progression with no new sites of metastatic  disease. - Pembrolizumab started on 09/03/2021.  XRT to the soft tissue nodule within the medial right thigh and midshaft right femoral lesion from 03/26/2022 through 04/09/2022.    Social/family history: - She is seen with her son today.  She lives by herself at home.  She is independent of ADLs and IADLs.  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   She is non-smoker. - Father had colon cancer.  Sister had breast cancer and colon cancer.  Maternal aunt had breast cancer.  Paternal uncle had colon cancer.   PLAN:  Malignant melanoma of the right posterior leg (pT4 pN3 M1), BRAF V600 negative: - I have reviewed her labs today which showed normal LFTs.  CBC was normal. - Examination of the right thigh did not show any palpable nodules. - She will proceed with Keytruda today.  RTC 3 weeks for follow-up with repeat troponin. - Will plan to repeat PET scan after next treatment.  2.  History of immunotherapy myocarditis: - Troponin is 11 today.  Last BNP is improved to 184.  She does not have any symptoms of chest pain.  3.  Right medial thigh pain: - Pain has improved.  Some burning sensation in the mid thigh is stable.  4.  Hypothyroidism: - Continue Synthroid 100 mcg daily.  Last TSH is 3.3.   Orders placed this encounter:  Orders Placed This Encounter  Procedures   CBC with Differential   Comprehensive metabolic panel   T4   TSH   CBC with Differential   Comprehensive metabolic panel      Derek Jack, MD [place] [phone]

## 2022-06-05 NOTE — Progress Notes (Signed)
Patient has been examined by Dr. Katragadda, and vital signs and labs have been reviewed. ANC, Creatinine, LFTs, hemoglobin, and platelets are within treatment parameters per M.D. - pt may proceed with treatment.  Primary RN and pharmacy notified.  

## 2022-06-05 NOTE — Progress Notes (Signed)
Pt presents today for Keytruda per provider's order. Vital signs and labs WNL for treatment. Okay to proceed with treatment today per Dr.K.  Beryle Flock given today per MD orders. Tolerated infusion without adverse affects. Vital signs stable. No complaints at this time. Discharged from clinic ambulatory in stable condition. Alert and oriented x 3. F/U with [facility] as scheduled.

## 2022-06-05 NOTE — Patient Instructions (Signed)
[place]  Discharge Instructions: Thank you for choosing [facility] to provide your oncology and hematology care.  If you have a lab appointment with the [facility], please come in thru the Main Entrance and check in at the main information desk.  Wear comfortable clothing and clothing appropriate for easy access to any Portacath or PICC line.   We strive to give you quality time with your provider. You may need to reschedule your appointment if you arrive late (15 or more minutes).  Arriving late affects you and other patients whose appointments are after yours.  Also, if you miss three or more appointments without notifying the office, you may be dismissed from the clinic at the provider's discretion.      For prescription refill requests, have your pharmacy contact our office and allow 72 hours for refills to be completed.    Today you received the following chemotherapy and/or immunotherapy agents Keytruda   To help prevent nausea and vomiting after your treatment, we encourage you to take your nausea medication as directed.  Pembrolizumab Injection What is this medication? PEMBROLIZUMAB (PEM broe LIZ ue mab) treats some types of cancer. It works by helping your immune system slow or stop the spread of cancer cells. It is a monoclonal antibody. This medicine may be used for other purposes; ask your health care provider or pharmacist if you have questions. COMMON BRAND NAME(S): Keytruda What should I tell my care team before I take this medication? They need to know if you have any of these conditions: Allogeneic stem cell transplant (uses someone else's stem cells) Autoimmune diseases, such as Crohn disease, ulcerative colitis, lupus History of chest radiation Nervous system problems, such as Guillain-Barre syndrome, myasthenia gravis Organ transplant An unusual or allergic reaction to pembrolizumab, other medications, foods, dyes, or  preservatives Pregnant or trying to get pregnant Breast-feeding How should I use this medication? This medication is injected into a vein. It is given by your care team in a hospital or clinic setting. A special MedGuide will be given to you before each treatment. Be sure to read this information carefully each time. Talk to your care team about the use of this medication in children. While it may be prescribed for children as young as 6 months for selected conditions, precautions do apply. Overdosage: If you think you have taken too much of this medicine contact a poison control center or emergency room at once. NOTE: This medicine is only for you. Do not share this medicine with others. What if I miss a dose? Keep appointments for follow-up doses. It is important not to miss your dose. Call your care team if you are unable to keep an appointment. What may interact with this medication? Interactions have not been studied. This list may not describe all possible interactions. Give your health care provider a list of all the medicines, herbs, non-prescription drugs, or dietary supplements you use. Also tell them if you smoke, drink alcohol, or use illegal drugs. Some items may interact with your medicine. What should I watch for while using this medication? Your condition will be monitored carefully while you are receiving this medication. You may need blood work while taking this medication. This medication may cause serious skin reactions. They can happen weeks to months after starting the medication. Contact your care team right away if you notice fevers or flu-like symptoms with a rash. The rash may be red or purple and then turn into  blisters or peeling of the skin. You may also notice a red rash with swelling of the face, lips, or lymph nodes in your neck or under your arms. Tell your care team right away if you have any change in your eyesight. Talk to your care team if you may be pregnant.  Serious birth defects can occur if you take this medication during pregnancy and for 4 months after the last dose. You will need a negative pregnancy test before starting this medication. Contraception is recommended while taking this medication and for 4 months after the last dose. Your care team can help you find the option that works for you. Do not breastfeed while taking this medication and for 4 months after the last dose. What side effects may I notice from receiving this medication? Side effects that you should report to your care team as soon as possible: Allergic reactions--skin rash, itching, hives, swelling of the face, lips, tongue, or throat Dry cough, shortness of breath or trouble breathing Eye pain, redness, irritation, or discharge with blurry or decreased vision Heart muscle inflammation--unusual weakness or fatigue, shortness of breath, chest pain, fast or irregular heartbeat, dizziness, swelling of the ankles, feet, or hands Hormone gland problems--headache, sensitivity to light, unusual weakness or fatigue, dizziness, fast or irregular heartbeat, increased sensitivity to cold or heat, excessive sweating, constipation, hair loss, increased thirst or amount of urine, tremors or shaking, irritability Infusion reactions--chest pain, shortness of breath or trouble breathing, feeling faint or lightheaded Kidney injury (glomerulonephritis)--decrease in the amount of urine, red or dark brown urine, foamy or bubbly urine, swelling of the ankles, hands, or feet Liver injury--right upper belly pain, loss of appetite, nausea, light-colored stool, dark yellow or brown urine, yellowing skin or eyes, unusual weakness or fatigue Pain, tingling, or numbness in the hands or feet, muscle weakness, change in vision, confusion or trouble speaking, loss of balance or coordination, trouble walking, seizures Rash, fever, and swollen lymph nodes Redness, blistering, peeling, or loosening of the skin,  including inside the mouth Sudden or severe stomach pain, bloody diarrhea, fever, nausea, vomiting Side effects that usually do not require medical attention (report to your care team if they continue or are bothersome): Bone, joint, or muscle pain Diarrhea Fatigue Loss of appetite Nausea Skin rash This list may not describe all possible side effects. Call your doctor for medical advice about side effects. You may report side effects to FDA at 1-800-FDA-1088. Where should I keep my medication? This medication is given in a hospital or clinic. It will not be stored at home. NOTE: This sheet is a summary. It may not cover all possible information. If you have questions about this medicine, talk to your doctor, pharmacist, or health care provider.  2023 Elsevier/Gold Standard (2013-01-17 00:00:00)  BELOW ARE SYMPTOMS THAT SHOULD BE REPORTED IMMEDIATELY: *FEVER GREATER THAN 100.4 F (38 C) OR HIGHER *CHILLS OR SWEATING *NAUSEA AND VOMITING THAT IS NOT CONTROLLED WITH YOUR NAUSEA MEDICATION *UNUSUAL SHORTNESS OF BREATH *UNUSUAL BRUISING OR BLEEDING *URINARY PROBLEMS (pain or burning when urinating, or frequent urination) *BOWEL PROBLEMS (unusual diarrhea, constipation, pain near the anus) TENDERNESS IN MOUTH AND THROAT WITH OR WITHOUT PRESENCE OF ULCERS (sore throat, sores in mouth, or a toothache) UNUSUAL RASH, SWELLING OR PAIN  UNUSUAL VAGINAL DISCHARGE OR ITCHING   Items with * indicate a potential emergency and should be followed up as soon as possible or go to the Emergency Department if any problems should occur.  Please show the CHEMOTHERAPY ALERT  ALERT CARD or IMMUNOTHERAPY ALERT CARD at check-in to the Emergency Department and triage nurse.  Should you have questions after your visit or need to cancel or reschedule your appointment, please contact [facility] [phone]  and follow the prompts.  Office hours are 8:00 a.m. to 4:30 p.m. Monday - Friday. Please  note that voicemails left after 4:00 p.m. may not be returned until the following business day.  We are closed weekends and major holidays. You have access to a nurse at all times for urgent questions. Please call the main number to the clinic [phone] and follow the prompts.  For any non-urgent questions, you may also contact your provider using MyChart. We now offer e-Visits for anyone 18 and older to request care online for non-urgent symptoms. For details visit [email or web address].   Also download the MyChart app! Go to the app store, search "MyChart", open the app, select [facility], and log in with your MyChart username and password.   

## 2022-06-05 NOTE — Patient Instructions (Signed)
[facility] Discharge Instructions   You were seen and examined today by Dr. Katragadda.  He reviewed the results of your lab work which are normal/stable.   We will proceed with your treatment today.  Return as scheduled.    Thank you for choosing [facility] at [facility] to provide your oncology and hematology care.  To afford each patient quality time with our provider, please arrive at least 15 minutes before your scheduled appointment time.   If you have a lab appointment with the Cancer Center please come in thru the Main Entrance and check in at the main information desk.  You need to re-schedule your appointment should you arrive 10 or more minutes late.  We strive to give you quality time with our providers, and arriving late affects you and other patients whose appointments are after yours.  Also, if you no show three or more times for appointments you may be dismissed from the clinic at the providers discretion.     Again, thank you for choosing [facility].  Our hope is that these requests will decrease the amount of time that you wait before being seen by our physicians.       _____________________________________________________________  Should you have questions after your visit to [facility], please contact our office at [phone] and follow the prompts.  Our office hours are 8:00 a.m. and 4:30 p.m. Monday - Friday.  Please note that voicemails left after 4:00 p.m. may not be returned until the following business day.  We are closed weekends and major holidays.  You do have access to a nurse 24-7, just call the main number to the clinic [phone] and do not press any options, hold on the line and a nurse will answer the phone.    For prescription refill requests, have your pharmacy contact our office and allow 72 hours.    Due to Covid, you will need to wear a mask upon entering  the hospital. If you do not have a mask, a mask will be given to you at the Main Entrance upon arrival. For doctor visits, patients may have 1 support person age 18 or older with them. For treatment visits, patients can not have anyone with them due to social distancing guidelines and our immunocompromised population.      

## 2022-06-06 ENCOUNTER — Other Ambulatory Visit: Payer: Self-pay

## 2022-06-08 ENCOUNTER — Other Ambulatory Visit: Payer: Self-pay

## 2022-06-11 ENCOUNTER — Other Ambulatory Visit: Payer: Self-pay

## 2022-06-23 ENCOUNTER — Ambulatory Visit: Payer: Medicare Other | Admitting: Cardiology

## 2022-06-23 ENCOUNTER — Encounter: Payer: Self-pay | Admitting: Cardiology

## 2022-06-23 VITALS — BP 115/51 | HR 64 | Ht 62.6 in | Wt 174.4 lb

## 2022-06-23 DIAGNOSIS — T50905A Adverse effect of unspecified drugs, medicaments and biological substances, initial encounter: Secondary | ICD-10-CM | POA: Diagnosis not present

## 2022-06-23 DIAGNOSIS — E781 Pure hyperglyceridemia: Secondary | ICD-10-CM | POA: Diagnosis not present

## 2022-06-23 DIAGNOSIS — I514 Myocarditis, unspecified: Secondary | ICD-10-CM

## 2022-06-23 DIAGNOSIS — I10 Essential (primary) hypertension: Secondary | ICD-10-CM | POA: Diagnosis not present

## 2022-06-23 NOTE — Progress Notes (Signed)
Follow up visit  Subjective:    Elizabeth Norman, female    DOB: [birth year], 82 y.o.   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 Coronary angiography did not show any significant CAD. Cardiac MRI showed focal myocarditis. Her immunotherapy was thus kept on hold. She is doing well, denies chest pain, shortness of breath, palpitations, leg edema, orthopnea, PND, TIA/syncope.  While BNP has remained mildly elevated since 05/2021, most recent troponin [id number]) was normal  Patient has been tolerating Keytruda well.   Reviewed recent test results with the patient, details below.     Current Outpatient Medications:    alendronate (FOSAMAX) 70 MG tablet, Take 70 mg by mouth once a week., Disp: , Rfl: 0   ALPRAZolam (XANAX) 0.5 MG tablet, Take 0.5 mg by mouth 3 (three) times daily., Disp: , Rfl: 2   aspirin EC 81 MG EC tablet, Take 1 tablet (81 mg total) by mouth daily. Swallow whole., Disp: 30 tablet, Rfl: 11   calcium carbonate (OS-CAL - DOSED IN MG OF ELEMENTAL CALCIUM) 1250 (500 CA) MG tablet, Take 1 tablet by mouth 3 (three) times a week., Disp: , Rfl:    cholecalciferol (VITAMIN D) 1000 UNITS tablet, Take 1,000 Units by mouth 3 (three) times a week., Disp: , Rfl:    Cyanocobalamin (VITAMIN B-12 PO), Take 1 tablet by mouth 3 (three) times a week., Disp: , Rfl:    levothyroxine (SYNTHROID) 100 MCG tablet, Take 1 tablet by mouth daily before breakfast., Disp: 30 tablet, Rfl: 3   metoprolol tartrate (LOPRESSOR) 25 MG tablet, Take 25 mg by mouth 2 (two) times daily., Disp: , Rfl:    nystatin (MYCOSTATIN) 100000 UNIT/ML suspension, Take 10 mLs (1,000,000 Units total) by mouth 4 (four) times daily. Swish and spit, Disp: 480 mL, Rfl: 1    prednisoLONE acetate (PRED FORTE) 1 % ophthalmic suspension, Place 1 drop into both eyes See admin instructions. Instill one drop into the right eye twice daily and one drop into the left eye once daily., Disp: , Rfl:     Cardiovascular & other pertient studies:  EKG 06/23/2022: Sinus rhythm 56 bpm RSR(V1) -nondiagnostic  Cardiac MRI 05/29/2021: 1. Focal gadolinium uptake in basal inferior lateral wall with pericardial involvement Elevated T2 in this region consistent with myocarditis 2.  Overall normal EF with no defined RWMA EF 57% 3.  Posterior MV leaflet prolapse with mild appearing MR 4.  Normal AV and aortic root 3.1 cm 5.  Normal RV size and function 6.  No pericardial effusion    Echocardiogram 05/29/2021:  1. Left ventricular ejection fraction, by estimation, is 55 to 60%. The  left ventricle has normal function. The left ventricle has no regional  wall motion abnormalities. Left ventricular diastolic parameters were  normal.   2. Right ventricular systolic function is normal. The right ventricular  size is normal. There is mildly elevated pulmonary artery systolic  pressure. The estimated right ventricular systolic pressure is 99991111 mmHg.   3. The mitral valve is degenerative. Mild mitral valve regurgitation. No  evidence of mitral stenosis.   4. Tricuspid valve regurgitation is moderate.   5. The aortic valve is normal in structure. Aortic valve regurgitation is  not visualized. No aortic stenosis is  present.   Coronary angiography 05/28/2021: LM: Normal  LAD: Mid 30% disease         Multiple diagonal branches supplying lateral wall         Diag 3, which actually comes up quite high off the LAD, with 50% ostial stenosis Lcx: No OM branches coming off proximal part of the vessel, possibly due to multiple high diagonals         No definite evidence of an occluded proximal OM        Minimal luminal irregularities in prox LCx RCA: Dominant vessel. No significant disease    LVEDP, LVEF normal  Recent labs: 06/05/2022: Glucose 111, BUN/Cr 16/0.82. EGFR >60. Na/K 133/4.1. Rest of the CMP normal BNP 184. Trop HS 11. H/H 12/39. MCV 90. Platelets 147 TSH 3.3 normal  05/28/2021: Chol 143., TG 86, HDL 31, LDL 95  Review of Systems  Cardiovascular:  Negative for chest pain, dyspnea on exertion, leg swelling, palpitations and syncope.         Vitals:   06/23/22 1145  BP: (!) 115/51  Pulse: 64  SpO2: 99%    Body mass index is 31.29 kg/m. Filed Weights   06/23/22 1145  Weight: 174 lb 6.4 oz (79.1 kg)     Objective:   Physical Exam Vitals and nursing note reviewed.  Constitutional:      General: She is not in acute distress. Neck:     Vascular: No JVD.  Cardiovascular:     Rate and Rhythm: Normal rate and regular rhythm.     Heart sounds: Normal heart sounds. No murmur heard. Pulmonary:     Effort: Pulmonary effort is normal.     Breath sounds: Normal breath sounds. No wheezing or rales.  Musculoskeletal:     Right lower leg: No edema.     Left lower leg: No edema.           Assessment & Recommendations:   82 year old Caucasian female with hypertension, malignant melanoma right leg, recent myocarditis (05/2021)  Myocarditis: In 05/2021, likely related to immunotherapy for malignant melanoma. Tolerating Nat Math well now.  Hypertension: Well controlled  F/u in 1 year    Nigel Mormon, MD Pager: [phone] Office: [phone]

## 2022-06-24 ENCOUNTER — Other Ambulatory Visit: Payer: Self-pay

## 2022-06-26 ENCOUNTER — Inpatient Hospital Stay: Payer: Medicare Other

## 2022-06-26 ENCOUNTER — Inpatient Hospital Stay: Payer: Medicare Other | Admitting: Hematology

## 2022-06-30 ENCOUNTER — Other Ambulatory Visit: Payer: Self-pay

## 2022-06-30 DIAGNOSIS — C4371 Malignant melanoma of right lower limb, including hip: Secondary | ICD-10-CM

## 2022-07-02 ENCOUNTER — Inpatient Hospital Stay: Payer: Medicare Other | Attending: Hematology

## 2022-07-02 ENCOUNTER — Inpatient Hospital Stay: Payer: Medicare Other | Admitting: Hematology

## 2022-07-02 ENCOUNTER — Inpatient Hospital Stay: Payer: Medicare Other

## 2022-07-02 VITALS — BP 117/55 | HR 77 | Temp 97.5°F | Resp 18

## 2022-07-02 DIAGNOSIS — Z5112 Encounter for antineoplastic immunotherapy: Secondary | ICD-10-CM | POA: Diagnosis not present

## 2022-07-02 DIAGNOSIS — Z79899 Other long term (current) drug therapy: Secondary | ICD-10-CM | POA: Insufficient documentation

## 2022-07-02 DIAGNOSIS — Z803 Family history of malignant neoplasm of breast: Secondary | ICD-10-CM | POA: Insufficient documentation

## 2022-07-02 DIAGNOSIS — Z95828 Presence of other vascular implants and grafts: Secondary | ICD-10-CM

## 2022-07-02 DIAGNOSIS — C4371 Malignant melanoma of right lower limb, including hip: Secondary | ICD-10-CM

## 2022-07-02 DIAGNOSIS — I10 Essential (primary) hypertension: Secondary | ICD-10-CM | POA: Diagnosis not present

## 2022-07-02 DIAGNOSIS — Z8679 Personal history of other diseases of the circulatory system: Secondary | ICD-10-CM | POA: Diagnosis not present

## 2022-07-02 DIAGNOSIS — Z800 Family history of malignant neoplasm of digestive organs: Secondary | ICD-10-CM | POA: Insufficient documentation

## 2022-07-02 DIAGNOSIS — C7951 Secondary malignant neoplasm of bone: Secondary | ICD-10-CM | POA: Diagnosis not present

## 2022-07-02 DIAGNOSIS — E039 Hypothyroidism, unspecified: Secondary | ICD-10-CM | POA: Diagnosis not present

## 2022-07-02 DIAGNOSIS — M79651 Pain in right thigh: Secondary | ICD-10-CM | POA: Diagnosis not present

## 2022-07-02 LAB — CBC WITH DIFFERENTIAL/PLATELET
Abs Immature Granulocytes: 0.01 10*3/uL (ref 0.00–0.07)
Basophils Absolute: 0 10*3/uL (ref 0.0–0.1)
Basophils Relative: 1 %
Eosinophils Absolute: 0.1 10*3/uL (ref 0.0–0.5)
Eosinophils Relative: 3 %
HCT: 38.4 % (ref 36.0–46.0)
Hemoglobin: 12.6 g/dL (ref 12.0–15.0)
Immature Granulocytes: 0 %
Lymphocytes Relative: 27 %
Lymphs Abs: 1.1 10*3/uL (ref 0.7–4.0)
MCH: 29.7 pg (ref 26.0–34.0)
MCHC: 32.8 g/dL (ref 30.0–36.0)
MCV: 90.6 fL (ref 80.0–100.0)
Monocytes Absolute: 0.4 10*3/uL (ref 0.1–1.0)
Monocytes Relative: 9 %
Neutro Abs: 2.5 10*3/uL (ref 1.7–7.7)
Neutrophils Relative %: 60 %
Platelets: 135 10*3/uL — ABNORMAL LOW (ref 150–400)
RBC: 4.24 MIL/uL (ref 3.87–5.11)
RDW: 13.1 % (ref 11.5–15.5)
WBC: 4.1 10*3/uL (ref 4.0–10.5)
nRBC: 0 % (ref 0.0–0.2)

## 2022-07-02 LAB — COMPREHENSIVE METABOLIC PANEL WITH GFR
ALT: 12 U/L (ref 0–44)
AST: 23 U/L (ref 15–41)
Albumin: 3.5 g/dL (ref 3.5–5.0)
Alkaline Phosphatase: 80 U/L (ref 38–126)
Anion gap: 7 (ref 5–15)
BUN: 15 mg/dL (ref 8–23)
CO2: 28 mmol/L (ref 22–32)
Calcium: 8.4 mg/dL — ABNORMAL LOW (ref 8.9–10.3)
Chloride: 100 mmol/L (ref 98–111)
Creatinine, Ser: 0.81 mg/dL (ref 0.44–1.00)
GFR, Estimated: >60 mL/min (ref >60)
Glucose, Bld: 100 mg/dL — ABNORMAL HIGH (ref 70–99)
Potassium: 4.2 mmol/L (ref 3.5–5.1)
Sodium: 135 mmol/L (ref 135–145)
Total Bilirubin: 0.7 mg/dL (ref 0.3–1.2)
Total Protein: 6.2 g/dL — ABNORMAL LOW (ref 6.5–8.1)

## 2022-07-02 LAB — MAGNESIUM: Magnesium: 1.9 mg/dL (ref 1.7–2.4)

## 2022-07-02 LAB — TSH: TSH: 3.252 u[IU]/mL (ref 0.350–4.500)

## 2022-07-02 LAB — TROPONIN I (HIGH SENSITIVITY): Troponin I (High Sensitivity): 9 ng/L (ref <18)

## 2022-07-02 LAB — BRAIN NATRIURETIC PEPTIDE: B Natriuretic Peptide: 218 pg/mL — ABNORMAL HIGH (ref 0.0–100.0)

## 2022-07-02 MED ORDER — SODIUM CHLORIDE 0.9 % IV SOLN: Freq: Once | INTRAVENOUS | Status: AC

## 2022-07-02 MED ORDER — SODIUM CHLORIDE 0.9% FLUSH
10.0000 mL | INTRAVENOUS | Status: DC | PRN
  Administered 2022-07-02: 10 mL

## 2022-07-02 MED ORDER — HEPARIN SOD (PORK) LOCK FLUSH 100 UNIT/ML IV SOLN
500.0000 [IU] | Freq: Once | INTRAVENOUS | Status: AC | PRN
  Administered 2022-07-02: 500 [IU]

## 2022-07-02 MED ORDER — SODIUM CHLORIDE 0.9 % IV SOLN
200.0000 mg | Freq: Once | INTRAVENOUS | Status: AC
  Administered 2022-07-02: 200 mg via INTRAVENOUS
  Filled 2022-07-02: qty 8

## 2022-07-02 MED ORDER — SODIUM CHLORIDE 0.9% FLUSH
10.0000 mL | INTRAVENOUS | Status: DC | PRN
  Administered 2022-07-02: 10 mL via INTRAVENOUS

## 2022-07-02 NOTE — Patient Instructions (Signed)
[place]  Discharge Instructions: Thank you for choosing [facility] to provide your oncology and hematology care.  If you have a lab appointment with the [facility], please come in thru the Main Entrance and check in at the main information desk.  Wear comfortable clothing and clothing appropriate for easy access to any Portacath or PICC line.   We strive to give you quality time with your provider. You may need to reschedule your appointment if you arrive late (15 or more minutes).  Arriving late affects you and other patients whose appointments are after yours.  Also, if you miss three or more appointments without notifying the office, you may be dismissed from the clinic at the provider's discretion.      For prescription refill requests, have your pharmacy contact our office and allow 72 hours for refills to be completed.    Today you received the following chemotherapy and/or immunotherapy agents Keytruda   To help prevent nausea and vomiting after your treatment, we encourage you to take your nausea medication as directed.  Pembrolizumab Injection What is this medication? PEMBROLIZUMAB (PEM broe LIZ ue mab) treats some types of cancer. It works by helping your immune system slow or stop the spread of cancer cells. It is a monoclonal antibody. This medicine may be used for other purposes; ask your health care provider or pharmacist if you have questions. COMMON BRAND NAME(S): Keytruda What should I tell my care team before I take this medication? They need to know if you have any of these conditions: Allogeneic stem cell transplant (uses someone else's stem cells) Autoimmune diseases, such as Crohn disease, ulcerative colitis, lupus History of chest radiation Nervous system problems, such as Guillain-Barre syndrome, myasthenia gravis Organ transplant An unusual or allergic reaction to pembrolizumab, other medications, foods, dyes, or  preservatives Pregnant or trying to get pregnant Breast-feeding How should I use this medication? This medication is injected into a vein. It is given by your care team in a hospital or clinic setting. A special MedGuide will be given to you before each treatment. Be sure to read this information carefully each time. Talk to your care team about the use of this medication in children. While it may be prescribed for children as young as 6 months for selected conditions, precautions do apply. Overdosage: If you think you have taken too much of this medicine contact a poison control center or emergency room at once. NOTE: This medicine is only for you. Do not share this medicine with others. What if I miss a dose? Keep appointments for follow-up doses. It is important not to miss your dose. Call your care team if you are unable to keep an appointment. What may interact with this medication? Interactions have not been studied. This list may not describe all possible interactions. Give your health care provider a list of all the medicines, herbs, non-prescription drugs, or dietary supplements you use. Also tell them if you smoke, drink alcohol, or use illegal drugs. Some items may interact with your medicine. What should I watch for while using this medication? Your condition will be monitored carefully while you are receiving this medication. You may need blood work while taking this medication. This medication may cause serious skin reactions. They can happen weeks to months after starting the medication. Contact your care team right away if you notice fevers or flu-like symptoms with a rash. The rash may be red or purple and then turn into  blisters or peeling of the skin. You may also notice a red rash with swelling of the face, lips, or lymph nodes in your neck or under your arms. Tell your care team right away if you have any change in your eyesight. Talk to your care team if you may be pregnant.  Serious birth defects can occur if you take this medication during pregnancy and for 4 months after the last dose. You will need a negative pregnancy test before starting this medication. Contraception is recommended while taking this medication and for 4 months after the last dose. Your care team can help you find the option that works for you. Do not breastfeed while taking this medication and for 4 months after the last dose. What side effects may I notice from receiving this medication? Side effects that you should report to your care team as soon as possible: Allergic reactions--skin rash, itching, hives, swelling of the face, lips, tongue, or throat Dry cough, shortness of breath or trouble breathing Eye pain, redness, irritation, or discharge with blurry or decreased vision Heart muscle inflammation--unusual weakness or fatigue, shortness of breath, chest pain, fast or irregular heartbeat, dizziness, swelling of the ankles, feet, or hands Hormone gland problems--headache, sensitivity to light, unusual weakness or fatigue, dizziness, fast or irregular heartbeat, increased sensitivity to cold or heat, excessive sweating, constipation, hair loss, increased thirst or amount of urine, tremors or shaking, irritability Infusion reactions--chest pain, shortness of breath or trouble breathing, feeling faint or lightheaded Kidney injury (glomerulonephritis)--decrease in the amount of urine, red or dark brown urine, foamy or bubbly urine, swelling of the ankles, hands, or feet Liver injury--right upper belly pain, loss of appetite, nausea, light-colored stool, dark yellow or brown urine, yellowing skin or eyes, unusual weakness or fatigue Pain, tingling, or numbness in the hands or feet, muscle weakness, change in vision, confusion or trouble speaking, loss of balance or coordination, trouble walking, seizures Rash, fever, and swollen lymph nodes Redness, blistering, peeling, or loosening of the skin,  including inside the mouth Sudden or severe stomach pain, bloody diarrhea, fever, nausea, vomiting Side effects that usually do not require medical attention (report to your care team if they continue or are bothersome): Bone, joint, or muscle pain Diarrhea Fatigue Loss of appetite Nausea Skin rash This list may not describe all possible side effects. Call your doctor for medical advice about side effects. You may report side effects to FDA at 1-800-FDA-1088. Where should I keep my medication? This medication is given in a hospital or clinic. It will not be stored at home. NOTE: This sheet is a summary. It may not cover all possible information. If you have questions about this medicine, talk to your doctor, pharmacist, or health care provider.  2023 Elsevier/Gold Standard (2013-01-17 00:00:00)   BELOW ARE SYMPTOMS THAT SHOULD BE REPORTED IMMEDIATELY: *FEVER GREATER THAN 100.4 F (38 C) OR HIGHER *CHILLS OR SWEATING *NAUSEA AND VOMITING THAT IS NOT CONTROLLED WITH YOUR NAUSEA MEDICATION *UNUSUAL SHORTNESS OF BREATH *UNUSUAL BRUISING OR BLEEDING *URINARY PROBLEMS (pain or burning when urinating, or frequent urination) *BOWEL PROBLEMS (unusual diarrhea, constipation, pain near the anus) TENDERNESS IN MOUTH AND THROAT WITH OR WITHOUT PRESENCE OF ULCERS (sore throat, sores in mouth, or a toothache) UNUSUAL RASH, SWELLING OR PAIN  UNUSUAL VAGINAL DISCHARGE OR ITCHING   Items with * indicate a potential emergency and should be followed up as soon as possible or go to the Emergency Department if any problems should occur.  Please show the CHEMOTHERAPY  ALERT CARD or IMMUNOTHERAPY ALERT CARD at check-in to the Emergency Department and triage nurse.  Should you have questions after your visit or need to cancel or reschedule your appointment, please contact [facility] [phone]  and follow the prompts.  Office hours are 8:00 a.m. to 4:30 p.m. Monday - Friday. Please  note that voicemails left after 4:00 p.m. may not be returned until the following business day.  We are closed weekends and major holidays. You have access to a nurse at all times for urgent questions. Please call the main number to the clinic [phone] and follow the prompts.  For any non-urgent questions, you may also contact your provider using MyChart. We now offer e-Visits for anyone 26 and older to request care online for non-urgent symptoms. For details visit [email or web address].   Also download the MyChart app! Go to the app store, search "MyChart", open the app, select [facility], and log in with your MyChart username and password.

## 2022-07-02 NOTE — Progress Notes (Signed)
Patients port flushed without difficulty.  Good blood return noted with no bruising or swelling noted at site.  Patient remains accessed for chemotherapy treatment.  

## 2022-07-02 NOTE — Progress Notes (Signed)
[facility] [address] [place] [address]    Clinic Day:  07/02/2022  Referring physician: Neale Burly, MD  Patient Care Team: Neale Burly, MD as PCP - General (Internal Medicine) Derek Jack, MD as Medical Oncologist (Medical Oncology) Brien Mates, RN as Oncology Nurse Navigator (Oncology)   ASSESSMENT & PLAN:   Assessment: Malignant melanoma of right posterior leg (pT4 pN3 M1): - She noticed fleshy lesion 4 months ago on the right leg. - Her PMD did a biopsy which was consistent with melanoma. - Wide local excision and lymph node biopsy by Dr. Barry Dienes on 02/12/2021. - Pathology margins free, nodular type, Breslow's thickness 9.52 mm, Clark level V, deep margins free, ulceration absent, satellitosis absent, mitotic index 2/millimeters squared, LVI negative.  Neurotropism absent.  Tumor infiltrating lymphocytes absent.  Tumor regression not noted.  3 out of 3 positive sentinel lymph nodes for melanoma. - PET CT scan on 03/22/2021 with 3 foci of intense radiotracer activity within the right lower extremity.  In the medial right upper thigh nodule just superficial to thigh musculature measuring 11 mm with SUV 7.6.  Intramedullary hypermetabolic activity within the shaft of the mid right femur with SUV 14.6.  Intense activity associated with condylar notch of the distal right femur, appears to localize to soft tissue rather than the bone.  More diffuse activity associated with the medial right calf related to excision biopsy.  There is a focus of activity adjacent to the lateral margin of the right iliac wing SUV 4.1.  No evidence of visceral metastatic disease in the chest, abdomen or pelvis. 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 - NGS testing-BRAF negative, positive for NRAS  pathogenic variant exon 3, TERT promoter.  MSI-stable.  MMR-proficient.  Perkins 1/2/3 fusion not detected.  KIT mutation negative.  PD-L1 negative. - Nivolumab-Relatlimab every 28 days started on 05/09/2021.  Held permanently after first dose due to myocarditis. - PET scan on 07/25/2021: Slight progression with no new sites of metastatic disease. - Pembrolizumab started on 09/03/2021.  XRT to the soft tissue nodule within the medial right thigh and midshaft right femoral lesion from 03/26/2022 through 04/09/2022.    Social/family history: - She is seen with her son today.  She lives by herself at home.  She is independent of ADLs and IADLs.  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  She is non-smoker. - Father had colon cancer.  Sister had breast cancer and colon cancer.  Maternal aunt had breast cancer.  Paternal uncle had colon cancer.  Plan: Malignant melanoma of the right posterior leg (pT4 pN3 M1), BRAF V600 negative: - She is tolerating Keytruda very well.  No chest pains reported.  Right thigh did not show any palpable nodules. - Reviewed labs today which showed normal LFTs.  CBC was grossly normal.  Troponin is 9.  TSH is 3.2. - Proceed with treatment today.  RTC 3 weeks for follow-up.  Will plan to repeat PET scan prior to next visit.  2.  History of immunotherapy myocarditis: - Troponin is 9 today.  BNP is 218.  She does not have any chest pain.  3.  Right medial thigh pain: - Burning sensation in the mid thigh is stable on and off.  No pain reported.  4.  Hypothyroidism: - Continue Synthroid 100 mcg daily.  TSH 3.25.  Orders Placed This Encounter  Procedures   NM PET Image Restage (PS) Whole Body    Standing Status:   Future    Standing Expiration Date:   07/01/2023    Order Specific Question:   If indicated for the ordered procedure, I authorize the administration of a radiopharmaceutical per Radiology protocol    Answer:   Yes     Order Specific Question:   Preferred imaging location?    Answer:   [facility]    Order Specific Question:   Release to patient    Answer:   Immediate   Magnesium    Standing Status:   Future    Standing Expiration Date:   09/24/2023   CBC with Differential    Standing Status:   Future    Standing Expiration Date:   09/25/2023   Comprehensive metabolic panel    Standing Status:   Future    Standing Expiration Date:   09/25/2023   Magnesium    Standing Status:   Future    Standing Expiration Date:   10/15/2023   CBC with Differential    Standing Status:   Future    Standing Expiration Date:   10/16/2023   Comprehensive metabolic panel    Standing Status:   Future    Standing Expiration Date:   10/16/2023   TSH    Standing Status:   Future    Standing Expiration Date:   10/16/2023      Beverly Gust Oliver,acting as a scribe for Derek Jack, MD.,have documented all relevant documentation on the behalf of Derek Jack, MD,as directed by  Derek Jack, MD while in the presence of Derek Jack, MD.   I, Derek Jack MD, have reviewed the above documentation for accuracy and completeness, and I agree with the above.   Derek Jack, MD   2/21/20245:42 PM  CHIEF COMPLAINT:   Diagnosis: metastatic malignant melanoma, BRAF V600 negative    Cancer Staging  Malignant melanoma of lower leg, right ([place]) Staging form: Melanoma of the Skin, AJCC 8th Edition - Clinical stage from 03/27/2021: Stage IV (cT4a, cN3, cM1) - Unsigned    Prior Therapy: None  Current Therapy:   Pembrolizumab (200) q21d Keytruda    HISTORY OF PRESENT ILLNESS:   Oncology History  Malignant melanoma of lower leg, right ([place])  03/27/2021 Initial Diagnosis   Malignant melanoma of lower leg, right ([place])   05/09/2021 - 05/09/2021 Chemotherapy   Patient is on Treatment Plan : MELANOMA - [place] (nivolumab/relatlimab) q28d     09/03/2021 - 01/07/2022 Chemotherapy   Patient  is on Treatment Plan : MELANOMA Pembrolizumab (200) q21d     09/03/2021 -  Chemotherapy   Patient is on Treatment Plan : MELANOMA Pembrolizumab (200) q21d        INTERVAL HISTORY:   Elizabeth Norman is a 82 y.o. female presenting to clinic today for follow up of metastatic malignant melanoma, BRAF V600 negative . She was last seen by me on 06/05/2022.  Today, she states that she is doing well overall. Her appetite level is at 100%. Her energy level is at 100%. She denies any new pain and problems. She takes Thyroid medication (Synthroid)  every morning.   PAST MEDICAL HISTORY:   Past Medical History: Past Medical History:  Diagnosis Date   Arthritis    Hypertension    Malignant melanoma of lower leg, right ([place]) 03/27/2021   Port-A-Cath in  place 04/30/2021    Surgical History: Past Surgical History:  Procedure Laterality Date   CATARACT EXTRACTION W/PHACO Right 10/03/2014   Procedure: CATARACT EXTRACTION PHACO AND INTRAOCULAR LENS PLACEMENT (IOC);  Surgeon: [name], MD;  Location: [facility];  Service: Ophthalmology;  Laterality: Right;  CDE:10.09   CATARACT EXTRACTION W/PHACO Left 10/10/2014   Procedure: CATARACT EXTRACTION PHACO AND INTRAOCULAR LENS PLACEMENT (IOC);  Surgeon: [name], MD;  Location: [facility];  Service: Ophthalmology;  Laterality: Left;  CDE:8.69   CORNEAL 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   CORNEAL TRANSPLANT Left 2020   DENTAL RESTORATION/EXTRACTION WITH X-RAY     IR IMAGING GUIDED PORT INSERTION  04/05/2021   LEFT HEART CATH AND CORONARY ANGIOGRAPHY N/A 05/28/2021   Procedure: LEFT HEART CATH AND CORONARY ANGIOGRAPHY;  Surgeon: Nigel Mormon, MD;  Location: [facility];  Service: Cardiovascular;  Laterality: N/A;   MELANOMA EXCISION Right 02/12/2021   Procedure: WIDE LOCAL EXCISION RIGHT LOWER LEG MELANOMA , ADVANCEMENT FLAP CLOSURE DEFECT;  Surgeon: [name], MD;  Location: [facility];  Service: General;  Laterality: Right;   SENTINEL NODE BIOPSY Right 02/12/2021    Procedure: SENTINEL NODE BIOPSY;  Surgeon: [name], MD;  Location: [facility];  Service: General;  Laterality: Right;    Social History: Social History   Socioeconomic History   Marital status: Widowed    Spouse name: Not on file   Number of children: 3   Years of education: Not on file   Highest education level: Not on file  Occupational History   Not on file  Tobacco Use   Smoking status: Never   Smokeless tobacco: Never  Vaping Use   Vaping Use: Never used  Substance and Sexual Activity   Alcohol use: No   Drug use: No   Sexual activity: Not on file  Other Topics Concern   Not on file  Social History Narrative   Not on file   Social Determinants of Health   Financial Resource Strain: Not on file  Food Insecurity: Not on file  Transportation Needs: Not on file  Physical Activity: Not on file  Stress: Not on file  Social Connections: Not on file  Intimate Partner Violence: Not on file    Family History: Family History  Problem Relation Age of Onset   Cancer Father    Hypertension Sister    Hypertension Sister     Current Medications:  Current Outpatient Medications:    alendronate (FOSAMAX) 70 MG tablet, Take 70 mg by mouth once a week., Disp: , Rfl: 0   ALPRAZolam (XANAX) 0.5 MG tablet, Take 0.5 mg by mouth 3 (three) times daily., Disp: , Rfl: 2   Aspirin Buf,CaCarb-MgCarb-MgO, (BUFFERIN PO), Take 400 mg by mouth daily at 2 PM., Disp: , Rfl:    calcium carbonate (OS-CAL - DOSED IN MG OF ELEMENTAL CALCIUM) 1250 (500 CA) MG tablet, Take 1 tablet by mouth 3 (three) times a week., Disp: , Rfl:    cholecalciferol (VITAMIN D) 1000 UNITS tablet, Take 1,000 Units by mouth 3 (three) times a week., Disp: , Rfl:    Cyanocobalamin (VITAMIN B-12 PO), Take 1 tablet by mouth 3 (three) times a week., Disp: , Rfl:    levothyroxine (SYNTHROID) 100 MCG tablet, Take 1 tablet by mouth daily before breakfast., Disp: 30 tablet, Rfl: 3   metoprolol tartrate (LOPRESSOR) 25 MG  tablet, Take 25 mg by mouth 2 (two) times daily., Disp: , Rfl:    prednisoLONE acetate (PRED FORTE) 1 %  ophthalmic suspension, Place 1 drop into both eyes See admin instructions. Instill one drop into the right eye twice daily and one drop into the left eye once daily., Disp: , Rfl:  No current facility-administered medications for this visit.  Facility-Administered Medications Ordered in Other Visits:    sodium chloride flush (NS) 0.9 % injection 10 mL, 10 mL, Intracatheter, PRN, Derek Jack, MD, 10 mL at 07/02/22 1337   Allergies: Allergies  Allergen Reactions   Tape     Pulls skin, please use paper tape    REVIEW OF SYSTEMS:   Review of Systems  Constitutional:  Negative for chills, fatigue and fever.  HENT:   Negative for lump/mass, mouth sores, nosebleeds, sore throat and trouble swallowing.   Eyes:  Negative for eye problems.  Respiratory:  Negative for cough and shortness of breath.   Cardiovascular:  Negative for chest pain, leg swelling and palpitations.  Gastrointestinal:  Negative for abdominal pain, constipation, diarrhea, nausea and vomiting.  Genitourinary:  Negative for bladder incontinence, difficulty urinating, dysuria, frequency, hematuria and nocturia.   Musculoskeletal:  Negative for arthralgias, back pain, flank pain, myalgias and neck pain.  Skin:  Negative for itching and rash.  Neurological:  Negative for dizziness, headaches and numbness.  Hematological:  Does not bruise/bleed easily.  Psychiatric/Behavioral:  Negative for depression, sleep disturbance and suicidal ideas. The patient is not nervous/anxious.   All other systems reviewed and are negative.    VITALS:   There were no vitals taken for this visit.  Wt Readings from Last 3 Encounters:  07/02/22 173 lb 6.4 oz (78.7 kg)  06/23/22 174 lb 6.4 oz (79.1 kg)  06/05/22 171 lb (77.6 kg)    There is no height or weight on file to calculate BMI.  Performance status (ECOG): 1 - Symptomatic but  completely ambulatory  PHYSICAL EXAM:   Physical Exam Vitals and nursing note reviewed. Exam conducted with a chaperone present.  Constitutional:      Appearance: Normal appearance.  Cardiovascular:     Rate and Rhythm: Normal rate and regular rhythm.     Pulses: Normal pulses.     Heart sounds: Normal heart sounds.  Pulmonary:     Effort: Pulmonary effort is normal.     Breath sounds: Normal breath sounds.  Abdominal:     Palpations: Abdomen is soft. There is no hepatomegaly, splenomegaly or mass.     Tenderness: There is no abdominal tenderness.  Musculoskeletal:     Right lower leg: No edema.     Left lower leg: No edema.  Lymphadenopathy:     Cervical: No cervical adenopathy.     Right cervical: No superficial, deep or posterior cervical adenopathy.    Left cervical: No superficial, deep or posterior cervical adenopathy.     Upper Body:     Right upper body: No supraclavicular or axillary adenopathy.     Left upper body: No supraclavicular or axillary adenopathy.  Neurological:     General: No focal deficit present.     Mental Status: She is alert and oriented to person, place, and time.  Psychiatric:        Mood and Affect: Mood normal.        Behavior: Behavior normal.     LABS:      Latest Ref Rng & Units 07/02/2022    9:11 AM 06/05/2022    8:18 AM 05/13/2022    8:49 AM  CBC  WBC 4.0 - 10.5 K/uL 4.1  4.6  4.2   Hemoglobin 12.0 - 15.0 g/dL 12.6  12.9  13.0   Hematocrit 36.0 - 46.0 % 38.4  39.0  40.0   Platelets 150 - 400 K/uL 135  147  140       Latest Ref Rng & Units 07/02/2022    9:11 AM 06/05/2022    8:18 AM 05/13/2022    8:49 AM  CMP  Glucose 70 - 99 mg/dL 100  111  84   BUN 8 - 23 mg/dL 15  16  15   $ Creatinine 0.44 - 1.00 mg/dL 0.81  0.82  0.87   Sodium 135 - 145 mmol/L 135  133  136   Potassium 3.5 - 5.1 mmol/L 4.2  4.1  5.0   Chloride 98 - 111 mmol/L 100  98  99   CO2 22 - 32 mmol/L 28  27  29   $ Calcium 8.9 - 10.3 mg/dL 8.4  8.7  8.8   Total  Protein 6.5 - 8.1 g/dL 6.2  6.7  6.5   Total Bilirubin 0.3 - 1.2 mg/dL 0.7  0.7  0.8   Alkaline Phos 38 - 126 U/L 80  82  114   AST 15 - 41 U/L 23  24  28   $ ALT 0 - 44 U/L 12  14  19      $ No results found for: "CEA1", "CEA" / No results found for: "CEA1", "CEA" No results found for: "PSA1" No results found for: "[id number]" No results found for: "CAN125"  No results found for: "TOTALPROTELP", "ALBUMINELP", "A1GS", "A2GS", "BETS", "BETA2SER", "GAMS", "MSPIKE", "SPEI" No results found for: "TIBC", "FERRITIN", "IRONPCTSAT" Lab Results  Component Value Date   LDH 137 06/05/2022   LDH 144 05/13/2022   LDH 168 11/05/2021     STUDIES:   No results found.

## 2022-07-02 NOTE — Patient Instructions (Signed)
[facility] at [facility] Discharge Instructions   You were seen and examined today by Dr. Katragadda.  He reviewed the results of your lab work which are normal/stable.   We will proceed with your treatment today.  Return as scheduled.    Thank you for choosing [facility] at [facility] to provide your oncology and hematology care.  To afford each patient quality time with our provider, please arrive at least 15 minutes before your scheduled appointment time.   If you have a lab appointment with the Cancer Center please come in thru the Main Entrance and check in at the main information desk.  You need to re-schedule your appointment should you arrive 10 or more minutes late.  We strive to give you quality time with our providers, and arriving late affects you and other patients whose appointments are after yours.  Also, if you no show three or more times for appointments you may be dismissed from the clinic at the providers discretion.     Again, thank you for choosing [facility].  Our hope is that these requests will decrease the amount of time that you wait before being seen by our physicians.       _____________________________________________________________  Should you have questions after your visit to [facility], please contact our office at [phone] and follow the prompts.  Our office hours are 8:00 a.m. and 4:30 p.m. Monday - Friday.  Please note that voicemails left after 4:00 p.m. may not be returned until the following business day.  We are closed weekends and major holidays.  You do have access to a nurse 24-7, just call the main number to the clinic [phone] and do not press any options, hold on the line and a nurse will answer the phone.    For prescription refill requests, have your pharmacy contact our office and allow 72 hours.    Due to Covid, you will need to wear a mask upon entering  the hospital. If you do not have a mask, a mask will be given to you at the Main Entrance upon arrival. For doctor visits, patients may have 1 support person age 18 or older with them. For treatment visits, patients can not have anyone with them due to social distancing guidelines and our immunocompromised population.      

## 2022-07-02 NOTE — Progress Notes (Signed)
Pt presents today for Keytruda per provider's order. Vital signs and labs WNL for treatment. Okay to proceed with treatment today per Dr.K.  Beryle Flock given today per MD orders. Tolerated infusion without adverse affects. Vital signs stable. No complaints at this time. Discharged from clinic ambulatory in stable condition. Alert and oriented x 3. F/U with [facility] as scheduled.

## 2022-07-15 DIAGNOSIS — I10 Essential (primary) hypertension: Secondary | ICD-10-CM | POA: Diagnosis not present

## 2022-07-15 DIAGNOSIS — C439 Malignant melanoma of skin, unspecified: Secondary | ICD-10-CM | POA: Diagnosis not present

## 2022-07-15 DIAGNOSIS — Z0000 Encounter for general adult medical examination without abnormal findings: Secondary | ICD-10-CM | POA: Diagnosis not present

## 2022-07-15 DIAGNOSIS — E7849 Other hyperlipidemia: Secondary | ICD-10-CM | POA: Diagnosis not present

## 2022-07-15 DIAGNOSIS — M810 Age-related osteoporosis without current pathological fracture: Secondary | ICD-10-CM | POA: Diagnosis not present

## 2022-07-17 ENCOUNTER — Inpatient Hospital Stay: Payer: Medicare Other | Admitting: Hematology

## 2022-07-17 ENCOUNTER — Encounter (HOSPITAL_COMMUNITY)
Admission: RE | Admit: 2022-07-17 | Discharge: 2022-07-17 | Disposition: A | Discharge status: Home / Self Care | Payer: Medicare Other | Source: Ambulatory Visit | Attending: Hematology | Admitting: Hematology

## 2022-07-17 ENCOUNTER — Inpatient Hospital Stay: Payer: Medicare Other

## 2022-07-17 DIAGNOSIS — C4371 Malignant melanoma of right lower limb, including hip: Secondary | ICD-10-CM | POA: Diagnosis not present

## 2022-07-17 DIAGNOSIS — C439 Malignant melanoma of skin, unspecified: Secondary | ICD-10-CM | POA: Diagnosis not present

## 2022-07-17 MED ORDER — FLUDEOXYGLUCOSE F - 18 (FDG) INJECTION
9.5300 | Freq: Once | INTRAVENOUS | Status: AC | PRN
  Administered 2022-07-17: 9.53 via INTRAVENOUS

## 2022-07-23 ENCOUNTER — Inpatient Hospital Stay: Payer: Medicare Other

## 2022-07-23 ENCOUNTER — Inpatient Hospital Stay: Payer: Medicare Other | Admitting: Hematology

## 2022-07-23 ENCOUNTER — Inpatient Hospital Stay: Payer: Medicare Other | Attending: Hematology

## 2022-07-23 VITALS — BP 129/60 | HR 62 | Temp 97.0°F | Resp 18

## 2022-07-23 DIAGNOSIS — Z79899 Other long term (current) drug therapy: Secondary | ICD-10-CM | POA: Diagnosis not present

## 2022-07-23 DIAGNOSIS — C7951 Secondary malignant neoplasm of bone: Secondary | ICD-10-CM | POA: Insufficient documentation

## 2022-07-23 DIAGNOSIS — C4371 Malignant melanoma of right lower limb, including hip: Secondary | ICD-10-CM

## 2022-07-23 DIAGNOSIS — Z800 Family history of malignant neoplasm of digestive organs: Secondary | ICD-10-CM | POA: Insufficient documentation

## 2022-07-23 DIAGNOSIS — Z5112 Encounter for antineoplastic immunotherapy: Secondary | ICD-10-CM | POA: Insufficient documentation

## 2022-07-23 DIAGNOSIS — Z8679 Personal history of other diseases of the circulatory system: Secondary | ICD-10-CM | POA: Insufficient documentation

## 2022-07-23 DIAGNOSIS — Z803 Family history of malignant neoplasm of breast: Secondary | ICD-10-CM | POA: Insufficient documentation

## 2022-07-23 DIAGNOSIS — Z95828 Presence of other vascular implants and grafts: Secondary | ICD-10-CM

## 2022-07-23 DIAGNOSIS — M79651 Pain in right thigh: Secondary | ICD-10-CM | POA: Diagnosis not present

## 2022-07-23 DIAGNOSIS — E039 Hypothyroidism, unspecified: Secondary | ICD-10-CM | POA: Insufficient documentation

## 2022-07-23 DIAGNOSIS — I514 Myocarditis, unspecified: Secondary | ICD-10-CM

## 2022-07-23 LAB — COMPREHENSIVE METABOLIC PANEL
ALT: 13 U/L (ref 0–44)
AST: 26 U/L (ref 15–41)
Albumin: 3.8 g/dL (ref 3.5–5.0)
Alkaline Phosphatase: 72 U/L (ref 38–126)
Anion gap: 10 (ref 5–15)
BUN: 13 mg/dL (ref 8–23)
CO2: 27 mmol/L (ref 22–32)
Calcium: 8.7 mg/dL — ABNORMAL LOW (ref 8.9–10.3)
Chloride: 99 mmol/L (ref 98–111)
Creatinine, Ser: 0.85 mg/dL (ref 0.44–1.00)
GFR, Estimated: >60 mL/min (ref >60)
Glucose, Bld: 107 mg/dL — ABNORMAL HIGH (ref 70–99)
Potassium: 4.1 mmol/L (ref 3.5–5.1)
Sodium: 136 mmol/L (ref 135–145)
Total Bilirubin: 1.1 mg/dL (ref 0.3–1.2)
Total Protein: 6.7 g/dL (ref 6.5–8.1)

## 2022-07-23 LAB — CBC WITH DIFFERENTIAL/PLATELET
Abs Immature Granulocytes: 0.01 10*3/uL (ref 0.00–0.07)
Basophils Absolute: 0.1 10*3/uL (ref 0.0–0.1)
Basophils Relative: 1 %
Eosinophils Absolute: 0.1 10*3/uL (ref 0.0–0.5)
Eosinophils Relative: 2 %
HCT: 38.3 % (ref 36.0–46.0)
Hemoglobin: 12.9 g/dL (ref 12.0–15.0)
Immature Granulocytes: 0 %
Lymphocytes Relative: 27 %
Lymphs Abs: 1.2 10*3/uL (ref 0.7–4.0)
MCH: 30.2 pg (ref 26.0–34.0)
MCHC: 33.7 g/dL (ref 30.0–36.0)
MCV: 89.7 fL (ref 80.0–100.0)
Monocytes Absolute: 0.4 10*3/uL (ref 0.1–1.0)
Monocytes Relative: 9 %
Neutro Abs: 2.8 10*3/uL (ref 1.7–7.7)
Neutrophils Relative %: 61 %
Platelets: 162 10*3/uL (ref 150–400)
RBC: 4.27 MIL/uL (ref 3.87–5.11)
RDW: 13.2 % (ref 11.5–15.5)
WBC: 4.6 10*3/uL (ref 4.0–10.5)
nRBC: 0 % (ref 0.0–0.2)

## 2022-07-23 LAB — TROPONIN I (HIGH SENSITIVITY): Troponin I (High Sensitivity): 12 ng/L (ref <18)

## 2022-07-23 LAB — MAGNESIUM: Magnesium: 2 mg/dL (ref 1.7–2.4)

## 2022-07-23 LAB — TSH: TSH: 1.799 u[IU]/mL (ref 0.350–4.500)

## 2022-07-23 MED ORDER — SODIUM CHLORIDE 0.9% FLUSH
10.0000 mL | INTRAVENOUS | Status: DC | PRN
  Administered 2022-07-23: 10 mL

## 2022-07-23 MED ORDER — SODIUM CHLORIDE 0.9 % IV SOLN: Freq: Once | INTRAVENOUS | Status: AC

## 2022-07-23 MED ORDER — HEPARIN SOD (PORK) LOCK FLUSH 100 UNIT/ML IV SOLN
500.0000 [IU] | Freq: Once | INTRAVENOUS | Status: AC | PRN
  Administered 2022-07-23: 500 [IU]

## 2022-07-23 MED ORDER — SODIUM CHLORIDE 0.9 % IV SOLN
200.0000 mg | Freq: Once | INTRAVENOUS | Status: AC
  Administered 2022-07-23: 200 mg via INTRAVENOUS
  Filled 2022-07-23: qty 8

## 2022-07-23 NOTE — Progress Notes (Signed)
Patient has been examined by Dr. Katragadda. Vital signs and labs have been reviewed by MD - ANC, Creatinine, LFTs, hemoglobin, and platelets are within treatment parameters per M.D. - pt may proceed with treatment.  Primary RN and pharmacy notified.  

## 2022-07-23 NOTE — Progress Notes (Signed)
Patient presents today for Keytruda infusion per providers order.  Vital signs and labs reviewed by MD.  Message received from Anastasio Champion RN/Dr. Delton Coombes patient okay for treatment.  Treatment given today per MD orders.  Stable during infusion without adverse affects.  Vital signs stable.  No complaints at this time.  Discharge from clinic ambulatory in stable condition.  Alert and oriented X 3.  Follow up with [facility] as scheduled.

## 2022-07-23 NOTE — Patient Instructions (Addendum)
[place] at [facility] Discharge Instructions   You were seen and examined today by Dr. Delton Coombes.  He reviewed the results of your PET scan which is stable. The area in your right thigh has decreased in size and is not lighting up as brightly as on previous scans. There are no new areas of cancer noted on the scan.   He reviewed the results of your lab work which are normal/stable.   We will proceed with your treatment today.   Return as scheduled.    Thank you for choosing [place] at [facility] to provide your oncology and hematology care.  To afford each patient quality time with our provider, please arrive at least 15 minutes before your scheduled appointment time.   If you have a lab appointment with the [facility] please come in thru the Main Entrance and check in at the main information desk.  You need to re-schedule your appointment should you arrive 10 or more minutes late.  We strive to give you quality time with our providers, and arriving late affects you and other patients whose appointments are after yours.  Also, if you no show three or more times for appointments you may be dismissed from the clinic at the providers discretion.     Again, thank you for choosing [facility].  Our hope is that these requests will decrease the amount of time that you wait before being seen by our physicians.       _____________________________________________________________  Should you have questions after your visit to [facility], please contact our office at [phone] and follow the prompts.  Our office hours are 8:00 a.m. and 4:30 p.m. Monday - Friday.  Please note that voicemails left after 4:00 p.m. may not be returned until the following business day.  We are closed weekends and major holidays.  You do have access to a nurse 24-7, just call the main number to the clinic [phone] and do not press any  options, hold on the line and a nurse will answer the phone.    For prescription refill requests, have your pharmacy contact our office and allow 72 hours.    Due to Covid, you will need to wear a mask upon entering the hospital. If you do not have a mask, a mask will be given to you at the Main Entrance upon arrival. For doctor visits, patients may have 1 support person age 6 or older with them. For treatment visits, patients can not have anyone with them due to social distancing guidelines and our immunocompromised population.

## 2022-07-23 NOTE — Progress Notes (Signed)
[facility] [address] [place] [address]    Clinic Day:  07/23/2022  Referring physician: Neale Burly, MD  Patient Care Team: Neale Burly, MD as PCP - General (Internal Medicine) Derek Jack, MD as Medical Oncologist (Medical Oncology) Brien Mates, RN as Oncology Nurse Navigator (Oncology)   ASSESSMENT & PLAN:   Assessment: Malignant melanoma of right posterior leg (pT4 pN3 M1): - She noticed fleshy lesion 4 months ago on the right leg. - Her PMD did a biopsy which was consistent with melanoma. - Wide local excision and lymph node biopsy by Dr. Barry Dienes on 02/12/2021. - Pathology margins free, nodular type, Breslow's thickness 9.52 mm, Clark level V, deep margins free, ulceration absent, satellitosis absent, mitotic index 2/millimeters squared, LVI negative.  Neurotropism absent.  Tumor infiltrating lymphocytes absent.  Tumor regression not noted.  3 out of 3 positive sentinel lymph nodes for melanoma. - PET CT scan on 03/22/2021 with 3 foci of intense radiotracer activity within the right lower extremity.  In the medial right upper thigh nodule just superficial to thigh musculature measuring 11 mm with SUV 7.6.  Intramedullary hypermetabolic activity within the shaft of the mid right femur with SUV 14.6.  Intense activity associated with condylar notch of the distal right femur, appears to localize to soft tissue rather than the bone.  More diffuse activity associated with the medial right calf related to excision biopsy.  There is a focus of activity adjacent to the lateral margin of the right iliac wing SUV 4.1.  No evidence of visceral metastatic disease in the chest, abdomen or pelvis. 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 - NGS testing-BRAF negative, positive for NRAS  pathogenic variant exon 3, TERT promoter.  MSI-stable.  MMR-proficient.  Concrete 1/2/3 fusion not detected.  KIT mutation negative.  PD-L1 negative. - Nivolumab-Relatlimab every 28 days started on 05/09/2021.  Held permanently after first dose due to myocarditis. - PET scan on 07/25/2021: Slight progression with no new sites of metastatic disease. - Pembrolizumab started on 09/03/2021.  XRT to the soft tissue nodule within the medial right thigh and midshaft right femoral lesion from 03/26/2022 through 04/09/2022.    Social/family history: - She is seen with her son today.  She lives by herself at home.  She is independent of ADLs and IADLs.  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  She is non-smoker. - Father had colon cancer.  Sister had breast cancer and colon cancer.  Maternal aunt had breast cancer.  Paternal uncle had colon cancer.  Plan: Malignant melanoma of the right posterior leg (pT4 pN3 M1), BRAF V600 negative: - She is tolerating Keytruda well.  No immunotherapy related side effects.  No chest pains. - Reviewed PET scan from 07/17/2022 which showed interval decrease in size of the soft tissue nodule in the right thigh.  Persistent uptake in the midshaft of the right femur but SUV improved.  No new sites.  Stable soft tissue mass in the presacral space.  Other benign findings discussed. - Reviewed labs today which showed normal LFTs.  CBC was normal.  TSH is 1.7. - Proceed with treatment today and in 3 weeks.  RTC 6 weeks for follow-up.  2.  History of immunotherapy myocarditis: - No chest pains reported.  Troponin today is 12.  Will continue monitor troponin with each cycle.  3.  Right medial thigh pain: - Burning sensation in the mid thigh is stable on and off.  No pain reported.  4.  Hypothyroidism: - Continue Synthroid 100 mcg daily.  TSH today is 1.79.  No orders of the defined types were placed in this encounter.    I,Alexis  Herring,acting as a Education administrator for [employer], MD.,have documented all relevant documentation on the behalf of Derek Jack, MD,as directed by  Derek Jack, MD while in the presence of Derek Jack, MD.  I, Derek Jack MD, have reviewed the above documentation for accuracy and completeness, and I agree with the above.   Derek Jack, MD   3/13/20247:22 PM  CHIEF COMPLAINT:   Diagnosis: metastatic malignant melanoma, BRAF V600 negative    Cancer Staging  Malignant melanoma of lower leg, right ([place]) Staging form: Melanoma of the Skin, AJCC 8th Edition - Clinical stage from 03/27/2021: Stage IV (cT4a, cN3, cM1) - Unsigned    Prior Therapy: None  Current Therapy:   Pembrolizumab (200) q21d Keytruda    HISTORY OF PRESENT ILLNESS:   Oncology History  Malignant melanoma of lower leg, right ([place])  03/27/2021 Initial Diagnosis   Malignant melanoma of lower leg, right ([place])   05/09/2021 - 05/09/2021 Chemotherapy   Patient is on Treatment Plan : MELANOMA - [place] (nivolumab/relatlimab) q28d     09/03/2021 - 01/07/2022 Chemotherapy   Patient is on Treatment Plan : MELANOMA Pembrolizumab (200) q21d     09/03/2021 -  Chemotherapy   Patient is on Treatment Plan : MELANOMA Pembrolizumab (200) q21d        INTERVAL HISTORY:   Arlett is a 82 y.o. female presenting to clinic today for follow up of metastatic malignant melanoma, BRAF V600 negative. She was last seen by me on 07/02/22.  Today, she states that she is doing well overall. Her appetite level is at 100%. Her energy level is at 100%.  PAST MEDICAL HISTORY:   Past Medical History: Past Medical History:  Diagnosis Date   Arthritis    Hypertension    Malignant melanoma of lower leg, right ([place]) 03/27/2021   Port-A-Cath in place 04/30/2021    Surgical History: Past Surgical History:  Procedure Laterality Date   CATARACT EXTRACTION W/PHACO Right 10/03/2014   Procedure: CATARACT  EXTRACTION PHACO AND INTRAOCULAR LENS PLACEMENT (IOC);  Surgeon: [name], MD;  Location: [facility];  Service: Ophthalmology;  Laterality: Right;  CDE:10.09   CATARACT EXTRACTION W/PHACO Left 10/10/2014   Procedure: CATARACT EXTRACTION PHACO AND INTRAOCULAR LENS PLACEMENT (IOC);  Surgeon: [name], MD;  Location: [facility];  Service: Ophthalmology;  Laterality: Left;  CDE:8.69   CORNEAL 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   CORNEAL TRANSPLANT Left 2020   DENTAL RESTORATION/EXTRACTION WITH X-RAY     IR IMAGING GUIDED PORT INSERTION  04/05/2021   LEFT HEART CATH AND CORONARY ANGIOGRAPHY N/A 05/28/2021   Procedure: LEFT HEART CATH AND CORONARY ANGIOGRAPHY;  Surgeon: Nigel Mormon, MD;  Location: [facility];  Service: Cardiovascular;  Laterality: N/A;   MELANOMA EXCISION Right 02/12/2021   Procedure: WIDE LOCAL EXCISION RIGHT LOWER LEG MELANOMA , ADVANCEMENT FLAP CLOSURE DEFECT;  Surgeon: [name], MD;  Location: [facility];  Service: General;  Laterality: Right;   SENTINEL NODE BIOPSY Right 02/12/2021   Procedure: SENTINEL NODE BIOPSY;  Surgeon: [name], MD;  Location: [facility];  Service: General;  Laterality: Right;    Social History: Social  History   Socioeconomic History   Marital status: Widowed    Spouse name: Not on file   Number of children: 3   Years of education: Not on file   Highest education level: Not on file  Occupational History   Not on file  Tobacco Use   Smoking status: Never   Smokeless tobacco: Never  Vaping Use   Vaping Use: Never used  Substance and Sexual Activity   Alcohol use: No   Drug use: No   Sexual activity: Not on file  Other Topics Concern   Not on file  Social History Narrative   Not on file   Social Determinants of Health   Financial Resource Strain: Not on file  Food Insecurity: Not on file  Transportation Needs: Not on file  Physical Activity: Not on file  Stress: Not on file  Social Connections: Not on file  Intimate Partner  Violence: Not on file    Family History: Family History  Problem Relation Age of Onset   Cancer Father    Hypertension Sister    Hypertension Sister     Current Medications:  Current Outpatient Medications:    alendronate (FOSAMAX) 70 MG tablet, Take 70 mg by mouth once a week., Disp: , Rfl: 0   ALPRAZolam (XANAX) 0.5 MG tablet, Take 0.5 mg by mouth 3 (three) times daily., Disp: , Rfl: 2   Aspirin Buf,CaCarb-MgCarb-MgO, (BUFFERIN PO), Take 400 mg by mouth daily at 2 PM., Disp: , Rfl:    calcium carbonate (OS-CAL - DOSED IN MG OF ELEMENTAL CALCIUM) 1250 (500 CA) MG tablet, Take 1 tablet by mouth 3 (three) times a week., Disp: , Rfl:    cholecalciferol (VITAMIN D) 1000 UNITS tablet, Take 1,000 Units by mouth 3 (three) times a week., Disp: , Rfl:    Cyanocobalamin (VITAMIN B-12 PO), Take 1 tablet by mouth 3 (three) times a week., Disp: , Rfl:    levothyroxine (SYNTHROID) 100 MCG tablet, Take 1 tablet by mouth daily before breakfast., Disp: 30 tablet, Rfl: 3   metoprolol tartrate (LOPRESSOR) 25 MG tablet, Take 25 mg by mouth 2 (two) times daily., Disp: , Rfl:    prednisoLONE acetate (PRED FORTE) 1 % ophthalmic suspension, Place 1 drop into both eyes See admin instructions. Instill one drop into the right eye twice daily and one drop into the left eye once daily., Disp: , Rfl:    Allergies: Allergies  Allergen Reactions   Tape     Pulls skin, please use paper tape    REVIEW OF SYSTEMS:   Review of Systems  Constitutional:  Negative for chills, fatigue and fever.  HENT:   Negative for lump/mass, mouth sores, nosebleeds, sore throat and trouble swallowing.   Eyes:  Negative for eye problems.  Respiratory:  Positive for shortness of breath. Negative for cough.   Cardiovascular:  Negative for chest pain, leg swelling and palpitations.  Gastrointestinal:  Negative for abdominal pain, constipation, diarrhea, nausea and vomiting.  Genitourinary:  Negative for bladder incontinence,  difficulty urinating, dysuria, frequency, hematuria and nocturia.   Musculoskeletal:  Negative for arthralgias, back pain, flank pain, myalgias and neck pain.  Skin:  Negative for itching and rash.  Neurological:  Negative for dizziness, headaches and numbness.  Hematological:  Does not bruise/bleed easily.  Psychiatric/Behavioral:  Positive for sleep disturbance. Negative for depression and suicidal ideas. The patient is not nervous/anxious.   All other systems reviewed and are negative.    VITALS:   There were no  vitals taken for this visit.  Wt Readings from Last 3 Encounters:  07/23/22 171 lb (77.6 kg)  07/02/22 173 lb 6.4 oz (78.7 kg)  06/23/22 174 lb 6.4 oz (79.1 kg)    There is no height or weight on file to calculate BMI.  Performance status (ECOG): 1 - Symptomatic but completely ambulatory  PHYSICAL EXAM:   Physical Exam Vitals and nursing note reviewed. Exam conducted with a chaperone present.  Constitutional:      Appearance: Normal appearance.  Cardiovascular:     Rate and Rhythm: Normal rate and regular rhythm.     Pulses: Normal pulses.     Heart sounds: Normal heart sounds.  Pulmonary:     Effort: Pulmonary effort is normal.     Breath sounds: Normal breath sounds.  Abdominal:     Palpations: Abdomen is soft. There is no hepatomegaly, splenomegaly or mass.     Tenderness: There is no abdominal tenderness.  Musculoskeletal:     Right lower leg: No edema.     Left lower leg: No edema.  Lymphadenopathy:     Cervical: No cervical adenopathy.     Right cervical: No superficial, deep or posterior cervical adenopathy.    Left cervical: No superficial, deep or posterior cervical adenopathy.     Upper Body:     Right upper body: No supraclavicular or axillary adenopathy.     Left upper body: No supraclavicular or axillary adenopathy.  Neurological:     General: No focal deficit present.     Mental Status: She is alert and oriented to person, place, and time.   Psychiatric:        Mood and Affect: Mood normal.        Behavior: Behavior normal.     LABS:      Latest Ref Rng & Units 07/23/2022    9:38 AM 07/02/2022    9:11 AM 06/05/2022    8:18 AM  CBC  WBC 4.0 - 10.5 K/uL 4.6  4.1  4.6   Hemoglobin 12.0 - 15.0 g/dL 12.9  12.6  12.9   Hematocrit 36.0 - 46.0 % 38.3  38.4  39.0   Platelets 150 - 400 K/uL 162  135  147       Latest Ref Rng & Units 07/23/2022    9:38 AM 07/02/2022    9:11 AM 06/05/2022    8:18 AM  CMP  Glucose 70 - 99 mg/dL 107  100  111   BUN 8 - 23 mg/dL '13  15  16   '$ Creatinine 0.44 - 1.00 mg/dL 0.85  0.81  0.82   Sodium 135 - 145 mmol/L 136  135  133   Potassium 3.5 - 5.1 mmol/L 4.1  4.2  4.1   Chloride 98 - 111 mmol/L 99  100  98   CO2 22 - 32 mmol/L '27  28  27   '$ Calcium 8.9 - 10.3 mg/dL 8.7  8.4  8.7   Total Protein 6.5 - 8.1 g/dL 6.7  6.2  6.7   Total Bilirubin 0.3 - 1.2 mg/dL 1.1  0.7  0.7   Alkaline Phos 38 - 126 U/L 72  80  82   AST 15 - 41 U/L '26  23  24   '$ ALT 0 - 44 U/L '13  12  14      '$ No results found for: "CEA1", "CEA" / No results found for: "CEA1", "CEA" No results found for: "PSA1" No results found for: "[id number]" No  results found for: "CAN125"  No results found for: "TOTALPROTELP", "ALBUMINELP", "A1GS", "A2GS", "BETS", "BETA2SER", "GAMS", "MSPIKE", "SPEI" No results found for: "TIBC", "FERRITIN", "IRONPCTSAT" Lab Results  Component Value Date   LDH 137 06/05/2022   LDH 144 05/13/2022   LDH 168 11/05/2021     STUDIES:   NM PET Image Restage (PS) Whole Body  Result Date: 07/19/2022 CLINICAL DATA:  Subsequent treatment strategy for melanoma. EXAM: NUCLEAR MEDICINE PET WHOLE BODY TECHNIQUE: 9.53 mCi F-18 FDG was injected intravenously. Full-ring PET imaging was performed from the head to foot after the radiotracer. CT data was obtained and used for attenuation correction and anatomic localization. Fasting blood glucose: 106 mg/dl COMPARISON:  02/13/2022 FINDINGS: Mediastinal blood pool activity:  SUV max 2.88 HEAD/NECK: Tracer avid nodule within the deep right parotid gland is again seen measuring 7 mm with SUV max of 5.19, image 59/3. Previously this measured 8 mm with SUV max of 5.07. Incidental CT findings: none CHEST: No hypermetabolic mediastinal or hilar nodes. No suspicious pulmonary nodules on the CT scan. Incidental CT findings: Aortic atherosclerosis and coronary artery calcifications. ABDOMEN/PELVIS: No abnormal hypermetabolic activity within the liver, pancreas, adrenal glands, or spleen. No hypermetabolic lymph nodes in the abdomen or pelvis. Incidental CT findings: Gallstones. Aortic atherosclerosis. Sigmoid diverticulosis without signs of acute diverticulitis. Mixed soft tissue and fat density mass within the presacral soft tissue space is again noted measuring 6.5 by 6.6 cm with SUV max of 1.87. This is, image 246/3. Compared with 6.5 x 6.6 cm previously with SUV max of 2.49. SKELETON: No focal hypermetabolic activity to suggest skeletal metastasis. Incidental CT findings: none EXTREMITIES: FDG uptake within the mid shaft of the right femur has an SUV max of 6.63, image 351 of the fused PET-CT images. On the previous exam FDG uptake was equal to 8.21. -tracer avid soft tissue nodule within the medial aspect of the right thigh measures 1 x 0.9 cm within SUV max of 2.38, image 318/3. On the previous exam this was equal to 1.4 x 1.3 cm with SUV max of 5.95. -SUV max associated with the presumed area of postsurgical change within the medial right calf is equal to 1.56, image 471/3. On the previous exam SUV max was equal to 2.04. Incidental CT findings: none IMPRESSION: 1. Interval decrease in size and degree of FDG uptake associated with soft tissue nodule within the medial aspect of the right thigh. 2. Persistent FDG uptake associated with the mid shaft of the right femur. SUV max is 6.63 versus 8.21 previously. 3. No new sites of disease identified. 4. Stable appearance of mixed soft tissue and  fat density mass within the presacral soft tissue space. 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 5. Stable FDG avid nodule within the deep right parotid gland. This is favored to represent a primary parotid neoplasm. 6. Gallstones. 7. Coronary artery calcifications. 8.  Aortic Atherosclerosis (ICD10-I70.0). Electronically Signed   By: Kerby Moors M.D.   On: 07/19/2022 11:10

## 2022-07-23 NOTE — Patient Instructions (Signed)
[facility]  Discharge Instructions: Thank you for choosing [facility] to provide your oncology and hematology care.  If you have a lab appointment with the Cancer Center, please come in thru the Main Entrance and check in at the main information desk.  Wear comfortable clothing and clothing appropriate for easy access to any Portacath or PICC line.   We strive to give you quality time with your provider. You may need to reschedule your appointment if you arrive late (15 or more minutes).  Arriving late affects you and other patients whose appointments are after yours.  Also, if you miss three or more appointments without notifying the office, you may be dismissed from the clinic at the provider's discretion.      For prescription refill requests, have your pharmacy contact our office and allow 72 hours for refills to be completed.    Today you received the following chemotherapy and/or immunotherapy agents Keytruda      To help prevent nausea and vomiting after your treatment, we encourage you to take your nausea medication as directed.  BELOW ARE SYMPTOMS THAT SHOULD BE REPORTED IMMEDIATELY: *FEVER GREATER THAN 100.4 F (38 C) OR HIGHER *CHILLS OR SWEATING *NAUSEA AND VOMITING THAT IS NOT CONTROLLED WITH YOUR NAUSEA MEDICATION *UNUSUAL SHORTNESS OF BREATH *UNUSUAL BRUISING OR BLEEDING *URINARY PROBLEMS (pain or burning when urinating, or frequent urination) *BOWEL PROBLEMS (unusual diarrhea, constipation, pain near the anus) TENDERNESS IN MOUTH AND THROAT WITH OR WITHOUT PRESENCE OF ULCERS (sore throat, sores in mouth, or a toothache) UNUSUAL RASH, SWELLING OR PAIN  UNUSUAL VAGINAL DISCHARGE OR ITCHING   Items with * indicate a potential emergency and should be followed up as soon as possible or go to the Emergency Department if any problems should occur.  Please show the CHEMOTHERAPY ALERT CARD or IMMUNOTHERAPY ALERT CARD at check-in to the  Emergency Department and triage nurse.  Should you have questions after your visit or need to cancel or reschedule your appointment, please contact [facility] [phone]  and follow the prompts.  Office hours are 8:00 a.m. to 4:30 p.m. Monday - Friday. Please note that voicemails left after 4:00 p.m. may not be returned until the following business day.  We are closed weekends and major holidays. You have access to a nurse at all times for urgent questions. Please call the main number to the clinic [phone] and follow the prompts.  For any non-urgent questions, you may also contact your provider using MyChart. We now offer e-Visits for anyone 18 and older to request care online for non-urgent symptoms. For details visit [email or web address].   Also download the MyChart app! Go to the app store, search "MyChart", open the app, select [facility], and log in with your MyChart username and password.   

## 2022-08-07 ENCOUNTER — Ambulatory Visit: Payer: Medicare Other | Admitting: Hematology

## 2022-08-07 ENCOUNTER — Ambulatory Visit: Payer: Medicare Other

## 2022-08-07 ENCOUNTER — Other Ambulatory Visit: Payer: Medicare Other

## 2022-08-13 ENCOUNTER — Inpatient Hospital Stay: Payer: Medicare Other

## 2022-08-13 ENCOUNTER — Inpatient Hospital Stay: Payer: Medicare Other | Attending: Hematology

## 2022-08-13 ENCOUNTER — Inpatient Hospital Stay: Payer: Medicare Other | Admitting: Hematology

## 2022-08-13 VITALS — BP 118/53 | HR 73 | Temp 97.2°F | Resp 18

## 2022-08-13 DIAGNOSIS — Z95828 Presence of other vascular implants and grafts: Secondary | ICD-10-CM

## 2022-08-13 DIAGNOSIS — Z5112 Encounter for antineoplastic immunotherapy: Secondary | ICD-10-CM | POA: Diagnosis not present

## 2022-08-13 DIAGNOSIS — C7951 Secondary malignant neoplasm of bone: Secondary | ICD-10-CM | POA: Insufficient documentation

## 2022-08-13 DIAGNOSIS — C4371 Malignant melanoma of right lower limb, including hip: Secondary | ICD-10-CM

## 2022-08-13 DIAGNOSIS — Z79899 Other long term (current) drug therapy: Secondary | ICD-10-CM | POA: Insufficient documentation

## 2022-08-13 DIAGNOSIS — I514 Myocarditis, unspecified: Secondary | ICD-10-CM

## 2022-08-13 LAB — CBC WITH DIFFERENTIAL/PLATELET
Abs Immature Granulocytes: 0.01 10*3/uL (ref 0.00–0.07)
Basophils Absolute: 0 10*3/uL (ref 0.0–0.1)
Basophils Relative: 1 %
Eosinophils Absolute: 0.1 10*3/uL (ref 0.0–0.5)
Eosinophils Relative: 2 %
HCT: 36.7 % (ref 36.0–46.0)
Hemoglobin: 12.1 g/dL (ref 12.0–15.0)
Immature Granulocytes: 0 %
Lymphocytes Relative: 31 %
Lymphs Abs: 1.1 10*3/uL (ref 0.7–4.0)
MCH: 30.1 pg (ref 26.0–34.0)
MCHC: 33 g/dL (ref 30.0–36.0)
MCV: 91.3 fL (ref 80.0–100.0)
Monocytes Absolute: 0.3 10*3/uL (ref 0.1–1.0)
Monocytes Relative: 8 %
Neutro Abs: 2.2 10*3/uL (ref 1.7–7.7)
Neutrophils Relative %: 58 %
Platelets: 130 10*3/uL — ABNORMAL LOW (ref 150–400)
RBC: 4.02 MIL/uL (ref 3.87–5.11)
RDW: 13.2 % (ref 11.5–15.5)
WBC: 3.7 10*3/uL — ABNORMAL LOW (ref 4.0–10.5)
nRBC: 0 % (ref 0.0–0.2)

## 2022-08-13 LAB — COMPREHENSIVE METABOLIC PANEL
ALT: 12 U/L (ref 0–44)
AST: 21 U/L (ref 15–41)
Albumin: 3.4 g/dL — ABNORMAL LOW (ref 3.5–5.0)
Alkaline Phosphatase: 70 U/L (ref 38–126)
Anion gap: 6 (ref 5–15)
BUN: 16 mg/dL (ref 8–23)
CO2: 27 mmol/L (ref 22–32)
Calcium: 8.4 mg/dL — ABNORMAL LOW (ref 8.9–10.3)
Chloride: 101 mmol/L (ref 98–111)
Creatinine, Ser: 0.83 mg/dL (ref 0.44–1.00)
GFR, Estimated: >60 mL/min (ref >60)
Glucose, Bld: 94 mg/dL (ref 70–99)
Potassium: 4.1 mmol/L (ref 3.5–5.1)
Sodium: 134 mmol/L — ABNORMAL LOW (ref 135–145)
Total Bilirubin: 0.7 mg/dL (ref 0.3–1.2)
Total Protein: 6.1 g/dL — ABNORMAL LOW (ref 6.5–8.1)

## 2022-08-13 LAB — MAGNESIUM: Magnesium: 2 mg/dL (ref 1.7–2.4)

## 2022-08-13 LAB — TSH: TSH: 1.564 u[IU]/mL (ref 0.350–4.500)

## 2022-08-13 LAB — TROPONIN I (HIGH SENSITIVITY): Troponin I (High Sensitivity): 8 ng/L (ref <18)

## 2022-08-13 MED ORDER — SODIUM CHLORIDE 0.9 % IV SOLN
200.0000 mg | Freq: Once | INTRAVENOUS | Status: AC
  Administered 2022-08-13: 200 mg via INTRAVENOUS
  Filled 2022-08-13: qty 8

## 2022-08-13 MED ORDER — SODIUM CHLORIDE 0.9% FLUSH
10.0000 mL | INTRAVENOUS | Status: DC | PRN
  Administered 2022-08-13: 10 mL

## 2022-08-13 MED ORDER — SODIUM CHLORIDE 0.9 % IV SOLN: Freq: Once | INTRAVENOUS | Status: AC

## 2022-08-13 MED ORDER — HEPARIN SOD (PORK) LOCK FLUSH 100 UNIT/ML IV SOLN
500.0000 [IU] | Freq: Once | INTRAVENOUS | Status: AC | PRN
  Administered 2022-08-13: 500 [IU]

## 2022-08-13 MED ORDER — SODIUM CHLORIDE 0.9% FLUSH
10.0000 mL | Freq: Once | INTRAVENOUS | Status: AC
  Administered 2022-08-13: 10 mL via INTRAVENOUS

## 2022-08-13 NOTE — Progress Notes (Signed)
Patients port flushed without difficulty.  Good blood return noted with no bruising or swelling noted at site.  Stable during access and blood draw.  Patient to remain accessed for treatment. 

## 2022-08-13 NOTE — Progress Notes (Signed)
Patient presents today for chemotherapy infusion.  Patient is in satisfactory condition with no new complaints voiced.  Vital signs are stable.  Labs reviewed and all labs are within treatment parameters. We will proceed with treatment per MD orders.   Patient tolerated treatment well with no complaints voiced.  Patient left ambulatory in stable condition.  Vital signs stable at discharge.  Follow up as scheduled.    

## 2022-08-13 NOTE — Patient Instructions (Signed)
[facility]  Discharge Instructions: Thank you for choosing [facility] to provide your oncology and hematology care.  If you have a lab appointment with the [facility] - please note that after April 8th, 2024, all labs will be drawn in the cancer center.  You do not have to check in or register with the main entrance as you have in the past but will complete your check-in in the cancer center.  Wear comfortable clothing and clothing appropriate for easy access to any Portacath or PICC line.   We strive to give you quality time with your provider. You may need to reschedule your appointment if you arrive late (15 or more minutes).  Arriving late affects you and other patients whose appointments are after yours.  Also, if you miss three or more appointments without notifying the office, you may be dismissed from the clinic at the provider's discretion.      For prescription refill requests, have your pharmacy contact our office and allow 72 hours for refills to be completed.    Today you received the following chemotherapy and/or immunotherapy agents Keytruda.  Pembrolizumab Injection What is this medication? PEMBROLIZUMAB (PEM broe LIZ ue mab) treats some types of cancer. It works by helping your immune system slow or stop the spread of cancer cells. It is a monoclonal antibody. This medicine may be used for other purposes; ask your health care provider or pharmacist if you have questions. COMMON BRAND NAME(S): Keytruda What should I tell my care team before I take this medication? They need to know if you have any of these conditions: Allogeneic stem cell transplant (uses someone else's stem cells) Autoimmune diseases, such as Crohn disease, ulcerative colitis, lupus History of chest radiation Nervous system problems, such as Guillain-Barre syndrome, myasthenia gravis Organ transplant An unusual or allergic reaction to pembrolizumab, other medications,  foods, dyes, or preservatives Pregnant or trying to get pregnant Breast-feeding How should I use this medication? This medication is injected into a vein. It is given by your care team in a hospital or clinic setting. A special MedGuide will be given to you before each treatment. Be sure to read this information carefully each time. Talk to your care team about the use of this medication in children. While it may be prescribed for children as young as 6 months for selected conditions, precautions do apply. Overdosage: If you think you have taken too much of this medicine contact a poison control center or emergency room at once. NOTE: This medicine is only for you. Do not share this medicine with others. What if I miss a dose? Keep appointments for follow-up doses. It is important not to miss your dose. Call your care team if you are unable to keep an appointment. What may interact with this medication? Interactions have not been studied. This list may not describe all possible interactions. Give your health care provider a list of all the medicines, herbs, non-prescription drugs, or dietary supplements you use. Also tell them if you smoke, drink alcohol, or use illegal drugs. Some items may interact with your medicine. What should I watch for while using this medication? Your condition will be monitored carefully while you are receiving this medication. You may need blood work while taking this medication. This medication may cause serious skin reactions. They can happen weeks to months after starting the medication. Contact your care team right away if you notice fevers or flu-like symptoms with a rash. The  rash may be red or purple and then turn into blisters or peeling of the skin. You may also notice a red rash with swelling of the face, lips, or lymph nodes in your neck or under your arms. Tell your care team right away if you have any change in your eyesight. Talk to your care team if you  may be pregnant. Serious birth defects can occur if you take this medication during pregnancy and for 4 months after the last dose. You will need a negative pregnancy test before starting this medication. Contraception is recommended while taking this medication and for 4 months after the last dose. Your care team can help you find the option that works for you. Do not breastfeed while taking this medication and for 4 months after the last dose. What side effects may I notice from receiving this medication? Side effects that you should report to your care team as soon as possible: Allergic reactions--skin rash, itching, hives, swelling of the face, lips, tongue, or throat Dry cough, shortness of breath or trouble breathing Eye pain, redness, irritation, or discharge with blurry or decreased vision Heart muscle inflammation--unusual weakness or fatigue, shortness of breath, chest pain, fast or irregular heartbeat, dizziness, swelling of the ankles, feet, or hands Hormone gland problems--headache, sensitivity to light, unusual weakness or fatigue, dizziness, fast or irregular heartbeat, increased sensitivity to cold or heat, excessive sweating, constipation, hair loss, increased thirst or amount of urine, tremors or shaking, irritability Infusion reactions--chest pain, shortness of breath or trouble breathing, feeling faint or lightheaded Kidney injury (glomerulonephritis)--decrease in the amount of urine, red or dark Elizabeth [place] urine, foamy or bubbly urine, swelling of the ankles, hands, or feet Liver injury--right upper belly pain, loss of appetite, nausea, light-colored stool, dark yellow or Elizabeth Norman urine, yellowing skin or eyes, unusual weakness or fatigue Pain, tingling, or numbness in the hands or feet, muscle weakness, change in vision, confusion or trouble speaking, loss of balance or coordination, trouble walking, seizures Rash, fever, and swollen lymph nodes Redness, blistering, peeling, or loosening  of the skin, including inside the mouth Sudden or severe stomach pain, bloody diarrhea, fever, nausea, vomiting Side effects that usually do not require medical attention (report to your care team if they continue or are bothersome): Bone, joint, or muscle pain Diarrhea Fatigue Loss of appetite Nausea Skin rash This list may not describe all possible side effects. Call your doctor for medical advice about side effects. You may report side effects to FDA at 1-800-FDA-1088. Where should I keep my medication? This medication is given in a hospital or clinic. It will not be stored at home. NOTE: This sheet is a summary. It may not cover all possible information. If you have questions about this medicine, talk to your doctor, pharmacist, or health care provider.  2023 Elsevier/Gold Standard (2021-09-10 00:00:00)        To help prevent nausea and vomiting after your treatment, we encourage you to take your nausea medication as directed.  BELOW ARE SYMPTOMS THAT SHOULD BE REPORTED IMMEDIATELY: *FEVER GREATER THAN 100.4 F (38 C) OR HIGHER *CHILLS OR SWEATING *NAUSEA AND VOMITING THAT IS NOT CONTROLLED WITH YOUR NAUSEA MEDICATION *UNUSUAL SHORTNESS OF BREATH *UNUSUAL BRUISING OR BLEEDING *URINARY PROBLEMS (pain or burning when urinating, or frequent urination) *BOWEL PROBLEMS (unusual diarrhea, constipation, pain near the anus) TENDERNESS IN MOUTH AND THROAT WITH OR WITHOUT PRESENCE OF ULCERS (sore throat, sores in mouth, or a toothache) UNUSUAL RASH, SWELLING OR PAIN  UNUSUAL VAGINAL DISCHARGE OR  ITCHING   Items with * indicate a potential emergency and should be followed up as soon as possible or go to the Emergency Department if any problems should occur.  Please show the CHEMOTHERAPY ALERT CARD or IMMUNOTHERAPY ALERT CARD at check-in to the Emergency Department and triage nurse.  Should you have questions after your visit or need to cancel or reschedule your appointment, please  contact [facility] [phone]  and follow the prompts.  Office hours are 8:00 a.m. to 4:30 p.m. Monday - Friday. Please note that voicemails left after 4:00 p.m. may not be returned until the following business day.  We are closed weekends and major holidays. You have access to a nurse at all times for urgent questions. Please call the main number to the clinic [phone] and follow the prompts.  For any non-urgent questions, you may also contact your provider using MyChart. We now offer e-Visits for anyone 5 and older to request care online for non-urgent symptoms. For details visit [email or web address].   Also download the MyChart app! Go to the app store, search "MyChart", open the app, select , and log in with your MyChart username and password.

## 2022-09-02 NOTE — Progress Notes (Signed)
[facility] [address], [place] [address]    Clinic Day:  09/03/2022  Referring physician: [name], MD  Patient Care Team: [name], MD as PCP - General (Internal Medicine) [name], MD as Medical Oncologist (Medical Oncology) [name], RN as Oncology Nurse Navigator (Oncology)   ASSESSMENT & PLAN:   Assessment: 1. Malignant melanoma of right posterior leg (pT4 pN3 M1): - She noticed fleshy lesion 4 months ago on the right leg. - Her PMD did a biopsy which was consistent with melanoma. - Wide local excision and lymph node biopsy by Dr. Donell Beers on 02/12/2021. - Pathology margins free, nodular type, Breslow's thickness 9.52 mm, Clark level V, deep margins free, ulceration absent, satellitosis absent, mitotic index 2/millimeters squared, LVI negative.  Neurotropism absent.  Tumor infiltrating lymphocytes absent.  Tumor regression not noted.  3 out of 3 positive sentinel lymph nodes for melanoma. - PET CT scan on 03/22/2021 with 3 foci of intense radiotracer activity within the right lower extremity.  In the medial right upper thigh nodule just superficial to thigh musculature measuring 11 mm with SUV 7.6.  Intramedullary hypermetabolic activity within the shaft of the mid right femur with SUV 14.6.  Intense activity associated with condylar notch of the distal right femur, appears to localize to soft tissue rather than the bone.  More diffuse activity associated with the medial right calf related to excision biopsy.  There is a focus of activity adjacent to the lateral margin of the right iliac wing SUV 4.1.  No evidence of visceral metastatic disease in the chest, abdomen or pelvis. 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 - NGS testing-BRAF negative, positive for NRAS  pathogenic variant exon 3, TERT promoter.  MSI-stable.  MMR-proficient.  NTRK 1/2/3 fusion not detected.  KIT mutation negative.  PD-L1 negative. - Nivolumab-Relatlimab every 28 days started on 05/09/2021.  Held permanently after first dose due to myocarditis. - PET scan on 07/25/2021: Slight progression with no new sites of metastatic disease. - Pembrolizumab started on 09/03/2021.  XRT to the soft tissue nodule within the medial right thigh and midshaft right femoral lesion from 03/26/2022 through 04/09/2022.   2. Social/family history: - She is seen with her son today.  She lives by herself at home.  She is independent of ADLs and IADLs.  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  She is non-smoker. - Father had colon cancer.  Sister had breast cancer and colon cancer.  Maternal aunt had breast cancer.  Paternal uncle had colon cancer.    Plan: 1. Malignant melanoma of the right posterior leg (pT4 pN3 M1), BRAF V600 negative: - PET scan from 07/17/2022 with interval response. - No immunotherapy related side effects. - Labs today: Normal LFTs.  Troponin is normal.  CBC was grossly normal with mild thrombocytopenia.  TSH is 1.5. - Proceed with treatment today and in 3 weeks.  RTC 6 weeks for follow-up.  Will arrange a PET scan between 4 to 6 months.  2.  History of immunotherapy myocarditis: - No chest pains reported.  Troponin has been staying normal.  Continue to monitor with every cycle.  3.  Right medial thigh pain: - Running sensation in the mid thigh is stable on and off.  No pain  reported.  4.  Hypothyroidism: - Continue Synthroid 100 mcg daily.  Last TSH is 1.5.  Orders Placed This Encounter  Procedures   Magnesium    Standing Status:   Future    Standing Expiration Date:   11/05/2023   CBC with Differential    Standing Status:   Future    Standing Expiration Date:   11/06/2023   Comprehensive metabolic panel    Standing Status:   Future     Standing Expiration Date:   11/06/2023   T4    Standing Status:   Future    Standing Expiration Date:   11/06/2023   TSH    Standing Status:   Future    Standing Expiration Date:   11/06/2023   Magnesium    Standing Status:   Future    Standing Expiration Date:   11/26/2023   CBC with Differential    Standing Status:   Future    Standing Expiration Date:   11/27/2023   Comprehensive metabolic panel    Standing Status:   Future    Standing Expiration Date:   11/27/2023   T4    Standing Status:   Future    Standing Expiration Date:   11/27/2023   TSH    Standing Status:   Future    Standing Expiration Date:   11/27/2023      I,[name],acting as a scribe for [name], MD.,have documented all relevant documentation on the behalf of [name], MD,as directed by  [name], MD while in the presence of [name], MD.   I, [name] MD, have reviewed the above documentation for accuracy and completeness, and I agree with the above.   [name], MD   4/24/20246:10 PM  CHIEF COMPLAINT:   Diagnosis: metastatic malignant melanoma, BRAF V600 negative    Cancer Staging  Malignant melanoma of lower leg, right Staging form: Melanoma of the Skin, AJCC 8th Edition - Clinical stage from 03/27/2021: Stage IV (cT4a, cN3, cM1) - Unsigned    Prior Therapy: none  Current Therapy:  Pembrolizumab (200) q21d Keytruda    HISTORY OF PRESENT ILLNESS:   Oncology History  Malignant melanoma of lower leg, right  03/27/2021 Initial Diagnosis   Malignant melanoma of lower leg, right (HCC)   05/09/2021 - 05/09/2021 Chemotherapy   Patient is on Treatment Plan : MELANOMA - Opdualag (nivolumab/relatlimab) q28d     09/03/2021 - 01/07/2022 Chemotherapy   Patient is on Treatment Plan : MELANOMA Pembrolizumab (200) q21d     09/03/2021 -  Chemotherapy   Patient is on Treatment Plan : MELANOMA Pembrolizumab (200) q21d         INTERVAL HISTORY:   Elizabeth Norman is a 82 y.o. female presenting to clinic today for follow up of metastatic malignant melanoma, BRAF V600 negative. She was last seen by me on 07/23/22.  Today, she states that she is doing well overall. Her appetite level is at 100%. Her energy level is at 100%.  PAST MEDICAL HISTORY:   Past Medical History: Past Medical History:  Diagnosis Date   Arthritis    Hypertension    Malignant melanoma of lower leg, right (HCC) 03/27/2021   Port-A-Cath in place 04/30/2021    Surgical History: Past Surgical History:  Procedure Laterality Date   CATARACT EXTRACTION W/PHACO Right 10/03/2014   Procedure: CATARACT EXTRACTION PHACO AND INTRAOCULAR LENS PLACEMENT (IOC);  Surgeon: Jethro Bolus, MD;  Location: [facility];  Service: Ophthalmology;  Laterality: Right;  CDE:10.09  CATARACT EXTRACTION W/PHACO Left 10/10/2014   Procedure: CATARACT EXTRACTION PHACO AND INTRAOCULAR LENS PLACEMENT (IOC);  Surgeon: Jethro Bolus, MD;  Location: [facility];  Service: Ophthalmology;  Laterality: Left;  CDE:8.69   CORNEAL 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   CORNEAL TRANSPLANT Left 2020   DENTAL RESTORATION/EXTRACTION WITH X-RAY     IR IMAGING GUIDED PORT INSERTION  04/05/2021   LEFT HEART CATH AND CORONARY ANGIOGRAPHY N/A 05/28/2021   Procedure: LEFT HEART CATH AND CORONARY ANGIOGRAPHY;  Surgeon: [name], MD;  Location: [facility];  Service: Cardiovascular;  Laterality: N/A;   MELANOMA EXCISION Right 02/12/2021   Procedure: WIDE LOCAL EXCISION RIGHT LOWER LEG MELANOMA , ADVANCEMENT FLAP CLOSURE DEFECT;  Surgeon: Almond Lint, MD;  Location: [facility];  Service: General;  Laterality: Right;   SENTINEL NODE BIOPSY Right 02/12/2021   Procedure: SENTINEL NODE BIOPSY;  Surgeon: Almond Lint, MD;  Location: [facility];  Service: General;  Laterality: Right;    Social History: Social History   Socioeconomic History   Marital status: Widowed    Spouse name: Not on file   Number of  children: 3   Years of education: Not on file   Highest education level: Not on file  Occupational History   Not on file  Tobacco Use   Smoking status: Never   Smokeless tobacco: Never  Vaping Use   Vaping Use: Never used  Substance and Sexual Activity   Alcohol use: No   Drug use: No   Sexual activity: Not on file  Other Topics Concern   Not on file  Social History Narrative   Not on file   Social Determinants of Health   Financial Resource Strain: Not on file  Food Insecurity: Not on file  Transportation Needs: Not on file  Physical Activity: Not on file  Stress: Not on file  Social Connections: Not on file  Intimate Partner Violence: Not on file    Family History: Family History  Problem Relation Age of Onset   Cancer Father    Hypertension Sister    Hypertension Sister     Current Medications:  Current Outpatient Medications:    alendronate (FOSAMAX) 70 MG tablet, Take 70 mg by mouth once a week., Disp: , Rfl: 0   ALPRAZolam (XANAX) 0.5 MG tablet, Take 0.5 mg by mouth 3 (three) times daily., Disp: , Rfl: 2   Aspirin Buf,CaCarb-MgCarb-MgO, (BUFFERIN PO), Take 400 mg by mouth daily at 2 PM., Disp: , Rfl:    calcium carbonate (OS-CAL - DOSED IN MG OF ELEMENTAL CALCIUM) 1250 (500 CA) MG tablet, Take 1 tablet by mouth 3 (three) times a week., Disp: , Rfl:    cholecalciferol (VITAMIN D) 1000 UNITS tablet, Take 1,000 Units by mouth 3 (three) times a week., Disp: , Rfl:    Cyanocobalamin (VITAMIN B-12 PO), Take 1 tablet by mouth 3 (three) times a week., Disp: , Rfl:    levothyroxine (SYNTHROID) 100 MCG tablet, Take 1 tablet by mouth daily before breakfast., Disp: 30 tablet, Rfl: 3   metoprolol tartrate (LOPRESSOR) 25 MG tablet, Take 25 mg by mouth 2 (two) times daily., Disp: , Rfl:    prednisoLONE acetate (PRED FORTE) 1 % ophthalmic suspension, Place 1 drop into both eyes See admin instructions. Instill one drop into the right eye twice daily and one drop into the left  eye once daily., Disp: , Rfl:  No current facility-administered medications for this visit.  Facility-Administered Medications Ordered in Other Visits:    sodium  chloride flush (NS) 0.9 % injection 10 mL, 10 mL, Intracatheter, PRN, [name], MD, 10 mL at 09/03/22 1311   Allergies: Allergies  Allergen Reactions   Tape     Pulls skin, please use paper tape    REVIEW OF SYSTEMS:   Review of Systems  Constitutional:  Negative for chills, fatigue and fever.  HENT:   Negative for lump/mass, mouth sores, nosebleeds, sore throat and trouble swallowing.   Eyes:  Negative for eye problems.  Respiratory:  Negative for cough and shortness of breath.   Cardiovascular:  Negative for chest pain, leg swelling and palpitations.  Gastrointestinal:  Negative for abdominal pain, constipation, diarrhea, nausea and vomiting.  Genitourinary:  Negative for bladder incontinence, difficulty urinating, dysuria, frequency, hematuria and nocturia.   Musculoskeletal:  Negative for arthralgias, back pain, flank pain, myalgias and neck pain.  Skin:  Negative for itching and rash.  Neurological:  Negative for dizziness, headaches and numbness.  Hematological:  Does not bruise/bleed easily.  Psychiatric/Behavioral:  Negative for depression, sleep disturbance and suicidal ideas. The patient is not nervous/anxious.   All other systems reviewed and are negative.    VITALS:   There were no vitals taken for this visit.  Wt Readings from Last 3 Encounters:  09/03/22 176 lb 6.4 oz (80 kg)  08/13/22 174 lb 9.6 oz (79.2 kg)  07/23/22 171 lb (77.6 kg)    There is no height or weight on file to calculate BMI.  Performance status (ECOG): 1 - Symptomatic but completely ambulatory  PHYSICAL EXAM:   Physical Exam Vitals and nursing note reviewed. Exam conducted with a chaperone present.  Constitutional:      Appearance: Normal appearance.  Cardiovascular:     Rate and Rhythm: Normal rate and regular  rhythm.     Pulses: Normal pulses.     Heart sounds: Normal heart sounds.  Pulmonary:     Effort: Pulmonary effort is normal.     Breath sounds: Normal breath sounds.  Abdominal:     Palpations: Abdomen is soft. There is no hepatomegaly, splenomegaly or mass.     Tenderness: There is no abdominal tenderness.  Musculoskeletal:     Right lower leg: No edema.     Left lower leg: No edema.  Lymphadenopathy:     Cervical: No cervical adenopathy.     Right cervical: No superficial, deep or posterior cervical adenopathy.    Left cervical: No superficial, deep or posterior cervical adenopathy.     Upper Body:     Right upper body: No supraclavicular or axillary adenopathy.     Left upper body: No supraclavicular or axillary adenopathy.  Neurological:     General: No focal deficit present.     Mental Status: She is alert and oriented to person, place, and time.  Psychiatric:        Mood and Affect: Mood normal.        Behavior: Behavior normal.     LABS:      Latest Ref Rng & Units 09/03/2022    9:50 AM 08/13/2022   10:03 AM 07/23/2022    9:38 AM  CBC  WBC 4.0 - 10.5 K/uL 4.0  3.7  4.6   Hemoglobin 12.0 - 15.0 g/dL 16.1  09.6  04.5   Hematocrit 36.0 - 46.0 % 36.9  36.7  38.3   Platelets 150 - 400 K/uL 132  130  162       Latest Ref Rng & Units 09/03/2022  9:50 AM 08/13/2022   10:03 AM 07/23/2022    9:38 AM  CMP  Glucose 70 - 99 mg/dL 161  94  096   BUN 8 - 23 mg/dL 15  16  13    Creatinine 0.44 - 1.00 mg/dL 0.45  4.09  8.11   Sodium 135 - 145 mmol/L 133  134  136   Potassium 3.5 - 5.1 mmol/L 4.4  4.1  4.1   Chloride 98 - 111 mmol/L 97  101  99   CO2 22 - 32 mmol/L 29  27  27    Calcium 8.9 - 10.3 mg/dL 9.0  8.4  8.7   Total Protein 6.5 - 8.1 g/dL 6.3  6.1  6.7   Total Bilirubin 0.3 - 1.2 mg/dL 0.8  0.7  1.1   Alkaline Phos 38 - 126 U/L 83  70  72   AST 15 - 41 U/L 21  21  26    ALT 0 - 44 U/L 13  12  13       No results found for: "CEA1", "CEA" / No results found for:  "CEA1", "CEA" No results found for: "PSA1" No results found for: "BJY782" No results found for: "CAN125"  No results found for: "TOTALPROTELP", "ALBUMINELP", "A1GS", "A2GS", "BETS", "BETA2SER", "GAMS", "MSPIKE", "SPEI" No results found for: "TIBC", "FERRITIN", "IRONPCTSAT" Lab Results  Component Value Date   LDH 137 06/05/2022   LDH 144 05/13/2022   LDH 168 11/05/2021     STUDIES:   No results found.

## 2022-09-03 ENCOUNTER — Inpatient Hospital Stay: Payer: Medicare Other

## 2022-09-03 ENCOUNTER — Inpatient Hospital Stay: Payer: Medicare Other | Admitting: Hematology

## 2022-09-03 VITALS — BP 125/54 | HR 59 | Temp 97.5°F | Resp 18

## 2022-09-03 DIAGNOSIS — Z79899 Other long term (current) drug therapy: Secondary | ICD-10-CM | POA: Diagnosis not present

## 2022-09-03 DIAGNOSIS — I514 Myocarditis, unspecified: Secondary | ICD-10-CM

## 2022-09-03 DIAGNOSIS — C4371 Malignant melanoma of right lower limb, including hip: Secondary | ICD-10-CM

## 2022-09-03 DIAGNOSIS — Z95828 Presence of other vascular implants and grafts: Secondary | ICD-10-CM

## 2022-09-03 DIAGNOSIS — C7951 Secondary malignant neoplasm of bone: Secondary | ICD-10-CM | POA: Diagnosis not present

## 2022-09-03 DIAGNOSIS — Z5112 Encounter for antineoplastic immunotherapy: Secondary | ICD-10-CM | POA: Diagnosis not present

## 2022-09-03 LAB — CBC WITH DIFFERENTIAL/PLATELET
Abs Immature Granulocytes: 0.01 10*3/uL (ref 0.00–0.07)
Basophils Absolute: 0 10*3/uL (ref 0.0–0.1)
Basophils Relative: 1 %
Eosinophils Absolute: 0.1 10*3/uL (ref 0.0–0.5)
Eosinophils Relative: 3 %
HCT: 36.9 % (ref 36.0–46.0)
Hemoglobin: 12.1 g/dL (ref 12.0–15.0)
Immature Granulocytes: 0 %
Lymphocytes Relative: 27 %
Lymphs Abs: 1.1 10*3/uL (ref 0.7–4.0)
MCH: 30 pg (ref 26.0–34.0)
MCHC: 32.8 g/dL (ref 30.0–36.0)
MCV: 91.3 fL (ref 80.0–100.0)
Monocytes Absolute: 0.3 10*3/uL (ref 0.1–1.0)
Monocytes Relative: 8 %
Neutro Abs: 2.4 10*3/uL (ref 1.7–7.7)
Neutrophils Relative %: 61 %
Platelets: 132 10*3/uL — ABNORMAL LOW (ref 150–400)
RBC: 4.04 MIL/uL (ref 3.87–5.11)
RDW: 13 % (ref 11.5–15.5)
WBC: 4 10*3/uL (ref 4.0–10.5)
nRBC: 0 % (ref 0.0–0.2)

## 2022-09-03 LAB — MAGNESIUM: Magnesium: 2 mg/dL (ref 1.7–2.4)

## 2022-09-03 LAB — COMPREHENSIVE METABOLIC PANEL
ALT: 13 U/L (ref 0–44)
AST: 21 U/L (ref 15–41)
Albumin: 3.5 g/dL (ref 3.5–5.0)
Alkaline Phosphatase: 83 U/L (ref 38–126)
Anion gap: 7 (ref 5–15)
BUN: 15 mg/dL (ref 8–23)
CO2: 29 mmol/L (ref 22–32)
Calcium: 9 mg/dL (ref 8.9–10.3)
Chloride: 97 mmol/L — ABNORMAL LOW (ref 98–111)
Creatinine, Ser: 0.9 mg/dL (ref 0.44–1.00)
GFR, Estimated: >60 mL/min (ref >60)
Glucose, Bld: 112 mg/dL — ABNORMAL HIGH (ref 70–99)
Potassium: 4.4 mmol/L (ref 3.5–5.1)
Sodium: 133 mmol/L — ABNORMAL LOW (ref 135–145)
Total Bilirubin: 0.8 mg/dL (ref 0.3–1.2)
Total Protein: 6.3 g/dL — ABNORMAL LOW (ref 6.5–8.1)

## 2022-09-03 LAB — TROPONIN I (HIGH SENSITIVITY): Troponin I (High Sensitivity): 8 ng/L (ref <18)

## 2022-09-03 MED ORDER — SODIUM CHLORIDE 0.9% FLUSH
10.0000 mL | INTRAVENOUS | Status: DC | PRN
  Administered 2022-09-03: 10 mL

## 2022-09-03 MED ORDER — SODIUM CHLORIDE 0.9% FLUSH
10.0000 mL | Freq: Once | INTRAVENOUS | Status: AC
  Administered 2022-09-03: 10 mL via INTRAVENOUS

## 2022-09-03 MED ORDER — SODIUM CHLORIDE 0.9 % IV SOLN
200.0000 mg | Freq: Once | INTRAVENOUS | Status: AC
  Administered 2022-09-03: 200 mg via INTRAVENOUS
  Filled 2022-09-03: qty 8

## 2022-09-03 MED ORDER — HEPARIN SOD (PORK) LOCK FLUSH 100 UNIT/ML IV SOLN
500.0000 [IU] | Freq: Once | INTRAVENOUS | Status: AC | PRN
  Administered 2022-09-03: 500 [IU]

## 2022-09-03 MED ORDER — SODIUM CHLORIDE 0.9 % IV SOLN: Freq: Once | INTRAVENOUS | Status: AC

## 2022-09-03 NOTE — Patient Instructions (Signed)
[facility] Discharge Instructions   You were seen and examined today by Dr. Katragadda.  He reviewed the results of your lab work which are normal/stable.   We will proceed with your treatment today.  Return as scheduled.    Thank you for choosing [facility] to provide your oncology and hematology care.  To afford each patient quality time with our provider, please arrive at least 15 minutes before your scheduled appointment time.   If you have a lab appointment with the Cancer Center please come in thru the Main Entrance and check in at the main information desk.  You need to re-schedule your appointment should you arrive 10 or more minutes late.  We strive to give you quality time with our providers, and arriving late affects you and other patients whose appointments are after yours.  Also, if you no show three or more times for appointments you may be dismissed from the clinic at the providers discretion.     Again, thank you for choosing [facility].  Our hope is that these requests will decrease the amount of time that you wait before being seen by our physicians.       _____________________________________________________________  Should you have questions after your visit to [facility], please contact our office at [phone] and follow the prompts.  Our office hours are 8:00 a.m. and 4:30 p.m. Monday - Friday.  Please note that voicemails left after 4:00 p.m. may not be returned until the following business day.  We are closed weekends and major holidays.  You do have access to a nurse 24-7, just call the main number to the clinic [phone] and do not press any options, hold on the line and a nurse will answer the phone.    For prescription refill requests, have your pharmacy contact our office and allow 72 hours.    Due to Covid, you will need to wear a mask upon entering  the hospital. If you do not have a mask, a mask will be given to you at the Main Entrance upon arrival. For doctor visits, patients may have 1 support person age 18 or older with them. For treatment visits, patients can not have anyone with them due to social distancing guidelines and our immunocompromised population.      

## 2022-09-03 NOTE — Progress Notes (Signed)
Patient presents today for [facility] and follow up visit with Dr. Ellin Saba. Labs reviewed and within parameters for treatment. Vital signs within parameters for treatment.   Message received from A. Dareen Piano RN / Dr.Katragadda to proceed with treatment.   Treatment given today per MD orders. Tolerated infusion without adverse affects. Vital signs stable. No complaints at this time. Discharged from clinic ambulatory in stable condition. Alert and oriented x 3. F/U with [facility] as scheduled.

## 2022-09-03 NOTE — Progress Notes (Signed)
Patient has been examined by Dr. Katragadda. Vital signs and labs have been reviewed by MD - ANC, Creatinine, LFTs, hemoglobin, and platelets are within treatment parameters per M.D. - pt may proceed with treatment.  Primary RN and pharmacy notified.  

## 2022-09-03 NOTE — Patient Instructions (Signed)
[facility]  Discharge Instructions: Thank you for choosing [facility] to provide your oncology and hematology care.  If you have a lab appointment with the Cancer Center - please note that after April 8th, 2024, all labs will be drawn in the cancer center.  You do not have to check in or register with the main entrance as you have in the past but will complete your check-in in the cancer center.  Wear comfortable clothing and clothing appropriate for easy access to any Portacath or PICC line.   We strive to give you quality time with your provider. You may need to reschedule your appointment if you arrive late (15 or more minutes).  Arriving late affects you and other patients whose appointments are after yours.  Also, if you miss three or more appointments without notifying the office, you may be dismissed from the clinic at the provider's discretion.      For prescription refill requests, have your pharmacy contact our office and allow 72 hours for refills to be completed.    Today you received the following chemotherapy and/or immunotherapy agents keytruda. Pembrolizumab Injection What is this medication? PEMBROLIZUMAB (PEM broe LIZ ue mab) treats some types of cancer. It works by helping your immune system slow or stop the spread of cancer cells. It is a monoclonal antibody. This medicine may be used for other purposes; ask your health care provider or pharmacist if you have questions. COMMON BRAND NAME(S): Keytruda What should I tell my care team before I take this medication? They need to know if you have any of these conditions: Allogeneic stem cell transplant (uses someone else's stem cells) Autoimmune diseases, such as Crohn disease, ulcerative colitis, lupus History of chest radiation Nervous system problems, such as Guillain-Barre syndrome, myasthenia gravis Organ transplant An unusual or allergic reaction to pembrolizumab, other medications,  foods, dyes, or preservatives Pregnant or trying to get pregnant Breast-feeding How should I use this medication? This medication is injected into a vein. It is given by your care team in a hospital or clinic setting. A special MedGuide will be given to you before each treatment. Be sure to read this information carefully each time. Talk to your care team about the use of this medication in children. While it may be prescribed for children as young as 6 months for selected conditions, precautions do apply. Overdosage: If you think you have taken too much of this medicine contact a poison control center or emergency room at once. NOTE: This medicine is only for you. Do not share this medicine with others. What if I miss a dose? Keep appointments for follow-up doses. It is important not to miss your dose. Call your care team if you are unable to keep an appointment. What may interact with this medication? Interactions have not been studied. This list may not describe all possible interactions. Give your health care provider a list of all the medicines, herbs, non-prescription drugs, or dietary supplements you use. Also tell them if you smoke, drink alcohol, or use illegal drugs. Some items may interact with your medicine. What should I watch for while using this medication? Your condition will be monitored carefully while you are receiving this medication. You may need blood work while taking this medication. This medication may cause serious skin reactions. They can happen weeks to months after starting the medication. Contact your care team right away if you notice fevers or flu-like symptoms with a rash. The rash  may be red or purple and then turn into blisters or peeling of the skin. You may also notice a red rash with swelling of the face, lips, or lymph nodes in your neck or under your arms. Tell your care team right away if you have any change in your eyesight. Talk to your care team if you  may be pregnant. Serious birth defects can occur if you take this medication during pregnancy and for 4 months after the last dose. You will need a negative pregnancy test before starting this medication. Contraception is recommended while taking this medication and for 4 months after the last dose. Your care team can help you find the option that works for you. Do not breastfeed while taking this medication and for 4 months after the last dose. What side effects may I notice from receiving this medication? Side effects that you should report to your care team as soon as possible: Allergic reactions--skin rash, itching, hives, swelling of the face, lips, tongue, or throat Dry cough, shortness of breath or trouble breathing Eye pain, redness, irritation, or discharge with blurry or decreased vision Heart muscle inflammation--unusual weakness or fatigue, shortness of breath, chest pain, fast or irregular heartbeat, dizziness, swelling of the ankles, feet, or hands Hormone gland problems--headache, sensitivity to light, unusual weakness or fatigue, dizziness, fast or irregular heartbeat, increased sensitivity to cold or heat, excessive sweating, constipation, hair loss, increased thirst or amount of urine, tremors or shaking, irritability Infusion reactions--chest pain, shortness of breath or trouble breathing, feeling faint or lightheaded Kidney injury (glomerulonephritis)--decrease in the amount of urine, red or dark brown urine, foamy or bubbly urine, swelling of the ankles, hands, or feet Liver injury--right upper belly pain, loss of appetite, nausea, light-colored stool, dark yellow or brown urine, yellowing skin or eyes, unusual weakness or fatigue Pain, tingling, or numbness in the hands or feet, muscle weakness, change in vision, confusion or trouble speaking, loss of balance or coordination, trouble walking, seizures Rash, fever, and swollen lymph nodes Redness, blistering, peeling, or loosening  of the skin, including inside the mouth Sudden or severe stomach pain, bloody diarrhea, fever, nausea, vomiting Side effects that usually do not require medical attention (report to your care team if they continue or are bothersome): Bone, joint, or muscle pain Diarrhea Fatigue Loss of appetite Nausea Skin rash This list may not describe all possible side effects. Call your doctor for medical advice about side effects. You may report side effects to FDA at 1-800-FDA-1088. Where should I keep my medication? This medication is given in a hospital or clinic. It will not be stored at home. NOTE: This sheet is a summary. It may not cover all possible information. If you have questions about this medicine, talk to your doctor, pharmacist, or health care provider.  2023 Elsevier/Gold Standard (2021-09-10 00:00:00)       To help prevent nausea and vomiting after your treatment, we encourage you to take your nausea medication as directed.  BELOW ARE SYMPTOMS THAT SHOULD BE REPORTED IMMEDIATELY: *FEVER GREATER THAN 100.4 F (38 C) OR HIGHER *CHILLS OR SWEATING *NAUSEA AND VOMITING THAT IS NOT CONTROLLED WITH YOUR NAUSEA MEDICATION *UNUSUAL SHORTNESS OF BREATH *UNUSUAL BRUISING OR BLEEDING *URINARY PROBLEMS (pain or burning when urinating, or frequent urination) *BOWEL PROBLEMS (unusual diarrhea, constipation, pain near the anus) TENDERNESS IN MOUTH AND THROAT WITH OR WITHOUT PRESENCE OF ULCERS (sore throat, sores in mouth, or a toothache) UNUSUAL RASH, SWELLING OR PAIN  UNUSUAL VAGINAL DISCHARGE OR ITCHING  Items with * indicate a potential emergency and should be followed up as soon as possible or go to the Emergency Department if any problems should occur.  Please show the CHEMOTHERAPY ALERT CARD or IMMUNOTHERAPY ALERT CARD at check-in to the Emergency Department and triage nurse.  Should you have questions after your visit or need to cancel or reschedule your appointment, please contact  [facility] [phone]  and follow the prompts.  Office hours are 8:00 a.m. to 4:30 p.m. Monday - Friday. Please note that voicemails left after 4:00 p.m. may not be returned until the following business day.  We are closed weekends and major holidays. You have access to a nurse at all times for urgent questions. Please call the main number to the clinic [phone] and follow the prompts.  For any non-urgent questions, you may also contact your provider using MyChart. We now offer e-Visits for anyone 18 and older to request care online for non-urgent symptoms. For details visit [email or web address].   Also download the MyChart app! Go to the app store, search "MyChart", open the app, select [facility], and log in with your MyChart username and password.   

## 2022-09-23 ENCOUNTER — Other Ambulatory Visit: Payer: Self-pay

## 2022-09-23 DIAGNOSIS — C4371 Malignant melanoma of right lower limb, including hip: Secondary | ICD-10-CM

## 2022-09-23 NOTE — Progress Notes (Signed)
[facility] [address] [place] [address]    Clinic Day:  09/24/2022  Referring physician: [name], MD  Patient Care Team: [name], MD as PCP - General (Internal Medicine) [name], MD as Medical Oncologist (Medical Oncology) [name], RN as Oncology Nurse Navigator (Oncology)   ASSESSMENT & PLAN:   Assessment: 1. Malignant melanoma of right posterior leg (pT4 pN3 M1): - She noticed fleshy lesion 4 months ago on the right leg. - Her PMD did a biopsy which was consistent with melanoma. - Wide local excision and lymph node biopsy by Dr. Donell Beers on 02/12/2021. - Pathology margins free, nodular type, Breslow's thickness 9.52 mm, Clark level V, deep margins free, ulceration absent, satellitosis absent, mitotic index 2/millimeters squared, LVI negative.  Neurotropism absent.  Tumor infiltrating lymphocytes absent.  Tumor regression not noted.  3 out of 3 positive sentinel lymph nodes for melanoma. - PET CT scan on 03/22/2021 with 3 foci of intense radiotracer activity within the right lower extremity.  In the medial right upper thigh nodule just superficial to thigh musculature measuring 11 mm with SUV 7.6.  Intramedullary hypermetabolic activity within the shaft of the mid right femur with SUV 14.6.  Intense activity associated with condylar notch of the distal right femur, appears to localize to soft tissue rather than the bone.  More diffuse activity associated with the medial right calf related to excision biopsy.  There is a focus of activity adjacent to the lateral margin of the right iliac wing SUV 4.1.  No evidence of visceral metastatic disease in the chest, abdomen or pelvis. 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 - NGS testing-BRAF negative, positive for NRAS  pathogenic variant exon 3, TERT promoter.  MSI-stable.  MMR-proficient.  NTRK 1/2/3 fusion not detected.  KIT mutation negative.  PD-L1 negative. - Nivolumab-Relatlimab every 28 days started on 05/09/2021.  Held permanently after first dose due to myocarditis. - PET scan on 07/25/2021: Slight progression with no new sites of metastatic disease. - Pembrolizumab started on 09/03/2021.  XRT to the soft tissue nodule within the medial right thigh and midshaft right femoral lesion from 03/26/2022 through 04/09/2022.   2. Social/family history: - She is seen with her son today.  She lives by herself at home.  She is independent of ADLs and IADLs.  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  She is non-smoker. - Father had colon cancer.  Sister had breast cancer and colon cancer.  Maternal aunt had breast cancer.  Paternal uncle had colon cancer.    Plan: 1. Malignant melanoma of the right posterior leg (pT4 pN3 M1), BRAF V600 negative: - No immunotherapy related side effects. - PET scan on 07/17/2022: Interval response. - Labs today: Normal LFTs and creatinine.  CBC grossly normal. - Recommend to continue treatment today and in 3 weeks.  RTC 6 weeks for follow-up.  Will plan to repeat PET scan in 4 months from the last.  2.  History of immunotherapy myocarditis: - No chest pains reported.  Troponin is 7 today.  Continue monitoring with every cycle.  3.  Right medial thigh pain: - Occasional burning sensation in the mid thigh is stable.  4.  Hypothyroidism: - TSH is 1.6.  Continue Synthroid 100 mcg daily.  Orders Placed This Encounter  Procedures   Magnesium    Standing Status:   Future    Standing Expiration Date:   12/17/2023   CBC with Differential    Standing Status:   Future    Standing Expiration Date:   12/18/2023   Comprehensive metabolic panel    Standing Status:   Future    Standing Expiration Date:   12/18/2023   T4    Standing Status:    Future    Standing Expiration Date:   12/18/2023   TSH    Standing Status:   Future    Standing Expiration Date:   12/18/2023   Magnesium    Standing Status:   Future    Standing Expiration Date:   01/07/2024   CBC with Differential    Standing Status:   Future    Standing Expiration Date:   01/08/2024   Comprehensive metabolic panel    Standing Status:   Future    Standing Expiration Date:   01/08/2024   T4    Standing Status:   Future    Standing Expiration Date:   01/08/2024   TSH    Standing Status:   Future    Standing Expiration Date:   01/08/2024   Magnesium    Standing Status:   Future    Standing Expiration Date:   01/28/2024   CBC with Differential    Standing Status:   Future    Standing Expiration Date:   01/29/2024   Comprehensive metabolic panel    Standing Status:   Future    Standing Expiration Date:   01/29/2024   T4    Standing Status:   Future    Standing Expiration Date:   01/29/2024   TSH    Standing Status:   Future    Standing Expiration Date:   01/29/2024   Magnesium    Standing Status:   Future    Standing Expiration Date:   02/18/2024   CBC with Differential    Standing Status:   Future    Standing Expiration Date:   02/19/2024   Comprehensive metabolic panel    Standing Status:   Future    Standing Expiration Date:   02/19/2024   T4    Standing Status:   Future    Standing Expiration Date:   02/19/2024   TSH    Standing Status:   Future    Standing Expiration Date:   02/19/2024   Magnesium    Standing Status:   Future    Standing Expiration Date:   03/10/2024   CBC with Differential    Standing Status:   Future    Standing Expiration Date:   03/11/2024   Comprehensive metabolic panel    Standing Status:   Future    Standing Expiration Date:   03/11/2024   T4    Standing Status:   Future    Standing Expiration Date:   03/11/2024   TSH    Standing Status:   Future    Standing Expiration Date:   03/11/2024      I,[name],acting  as a scribe for [name], MD.,have documented all relevant documentation on the behalf of [name], MD,as directed by  [name], MD while in the presence of [name], MD.   I, [name] MD, have reviewed the above documentation for accuracy and completeness, and I agree with the above.   [name], MD   5/15/20245:35 PM  CHIEF COMPLAINT:  Diagnosis:  metastatic malignant melanoma, BRAF V600 negative    Cancer Staging  Malignant melanoma of lower leg, right (HCC) Staging form: Melanoma of the Skin, AJCC 8th Edition - Clinical stage from 03/27/2021: Stage IV (cT4a, cN3, cM1) - Unsigned    Prior Therapy: none  Current Therapy:  Pembrolizumab (200) q21d Keytruda    HISTORY OF PRESENT ILLNESS:   Oncology History  Malignant melanoma of lower leg, right (HCC)  03/27/2021 Initial Diagnosis   Malignant melanoma of lower leg, right (HCC)   05/09/2021 - 05/09/2021 Chemotherapy   Patient is on Treatment Plan : MELANOMA - Opdualag (nivolumab/relatlimab) q28d     09/03/2021 - 01/07/2022 Chemotherapy   Patient is on Treatment Plan : MELANOMA Pembrolizumab (200) q21d     09/03/2021 -  Chemotherapy   Patient is on Treatment Plan : MELANOMA Pembrolizumab (200) q21d        INTERVAL HISTORY:   Mycah is a 82 y.o. female presenting to clinic today for follow up of metastatic malignant melanoma. She was last seen by me on 09/03/22.  Today, she states that she is doing well overall. Her appetite level is at 100%. Her energy level is at 75%.  PAST MEDICAL HISTORY:   Past Medical History: Past Medical History:  Diagnosis Date   Arthritis    Hypertension    Malignant melanoma of lower leg, right (HCC) 03/27/2021   Port-A-Cath in place 04/30/2021    Surgical History: Past Surgical History:  Procedure Laterality Date   CATARACT EXTRACTION W/PHACO Right 10/03/2014   Procedure: CATARACT EXTRACTION PHACO AND INTRAOCULAR LENS  PLACEMENT (IOC);  Surgeon: Jethro Bolus, MD;  Location: [facility];  Service: Ophthalmology;  Laterality: Right;  CDE:10.09   CATARACT EXTRACTION W/PHACO Left 10/10/2014   Procedure: CATARACT EXTRACTION PHACO AND INTRAOCULAR LENS PLACEMENT (IOC);  Surgeon: Jethro Bolus, MD;  Location: [facility];  Service: Ophthalmology;  Laterality: Left;  CDE:8.69   CORNEAL 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   CORNEAL TRANSPLANT Left 2020   DENTAL RESTORATION/EXTRACTION WITH X-RAY     IR IMAGING GUIDED PORT INSERTION  04/05/2021   LEFT HEART CATH AND CORONARY ANGIOGRAPHY N/A 05/28/2021   Procedure: LEFT HEART CATH AND CORONARY ANGIOGRAPHY;  Surgeon: [name], MD;  Location: [facility];  Service: Cardiovascular;  Laterality: N/A;   MELANOMA EXCISION Right 02/12/2021   Procedure: WIDE LOCAL EXCISION RIGHT LOWER LEG MELANOMA , ADVANCEMENT FLAP CLOSURE DEFECT;  Surgeon: Almond Lint, MD;  Location: [facility];  Service: General;  Laterality: Right;   SENTINEL NODE BIOPSY Right 02/12/2021   Procedure: SENTINEL NODE BIOPSY;  Surgeon: Almond Lint, MD;  Location: [facility];  Service: General;  Laterality: Right;    Social History: Social History   Socioeconomic History   Marital status: Widowed    Spouse name: Not on file   Number of children: 3   Years of education: Not on file   Highest education level: Not on file  Occupational History   Not on file  Tobacco Use   Smoking status: Never   Smokeless tobacco: Never  Vaping Use   Vaping Use: Never used  Substance and Sexual Activity   Alcohol use: No   Drug use: No   Sexual activity: Not on file  Other Topics Concern   Not on file  Social History Narrative   Not on file   Social Determinants of Health   Financial Resource Strain: Not on file  Food Insecurity: Not on file  Transportation Needs: Not on file  Physical Activity: Not on file  Stress: Not on file  Social Connections: Not on file  Intimate Partner Violence: Not on file    Family  History: Family History  Problem Relation Age of Onset   Cancer Father    Hypertension Sister    Hypertension Sister     Current Medications:  Current Outpatient Medications:    alendronate (FOSAMAX) 70 MG tablet, Take 70 mg by mouth once a week., Disp: , Rfl: 0   ALPRAZolam (XANAX) 0.5 MG tablet, Take 0.5 mg by mouth 3 (three) times daily., Disp: , Rfl: 2   Aspirin Buf,CaCarb-MgCarb-MgO, (BUFFERIN PO), Take 400 mg by mouth daily at 2 PM., Disp: , Rfl:    calcium carbonate (OS-CAL - DOSED IN MG OF ELEMENTAL CALCIUM) 1250 (500 CA) MG tablet, Take 1 tablet by mouth 3 (three) times a week., Disp: , Rfl:    cholecalciferol (VITAMIN D) 1000 UNITS tablet, Take 1,000 Units by mouth 3 (three) times a week., Disp: , Rfl:    Cyanocobalamin (VITAMIN B-12 PO), Take 1 tablet by mouth 3 (three) times a week., Disp: , Rfl:    levothyroxine (SYNTHROID) 100 MCG tablet, Take 1 tablet by mouth daily before breakfast., Disp: 30 tablet, Rfl: 3   metoprolol tartrate (LOPRESSOR) 25 MG tablet, Take 25 mg by mouth 2 (two) times daily., Disp: , Rfl:    prednisoLONE acetate (PRED FORTE) 1 % ophthalmic suspension, Place 1 drop into both eyes See admin instructions. Instill one drop into the right eye twice daily and one drop into the left eye once daily., Disp: , Rfl:  No current facility-administered medications for this visit.  Facility-Administered Medications Ordered in Other Visits:    sodium chloride flush (NS) 0.9 % injection 10 mL, 10 mL, Intracatheter, PRN, [name], MD, 10 mL at 09/24/22 1156   Allergies: Allergies  Allergen Reactions   Tape     Pulls skin, please use paper tape    REVIEW OF SYSTEMS:   Review of Systems  Constitutional:  Negative for chills, fatigue and fever.  HENT:   Negative for lump/mass, mouth sores, nosebleeds, sore throat and trouble swallowing.   Eyes:  Negative for eye problems.  Respiratory:  Negative for cough and shortness of breath.   Cardiovascular:   Negative for chest pain, leg swelling and palpitations.  Gastrointestinal:  Negative for abdominal pain, constipation, diarrhea, nausea and vomiting.  Genitourinary:  Negative for bladder incontinence, difficulty urinating, dysuria, frequency, hematuria and nocturia.   Musculoskeletal:  Negative for arthralgias, back pain, flank pain, myalgias and neck pain.  Skin:  Negative for itching and rash.  Neurological:  Negative for dizziness, headaches and numbness.  Hematological:  Does not bruise/bleed easily.  Psychiatric/Behavioral:  Negative for depression, sleep disturbance and suicidal ideas. The patient is not nervous/anxious.   All other systems reviewed and are negative.    VITALS:   There were no vitals taken for this visit.  Wt Readings from Last 3 Encounters:  09/24/22 173 lb (78.5 kg)  09/03/22 176 lb 6.4 oz (80 kg)  08/13/22 174 lb 9.6 oz (79.2 kg)    There is no height or weight on file to calculate BMI.  Performance status (ECOG): 1 - Symptomatic but completely ambulatory  PHYSICAL EXAM:   Physical Exam Vitals and nursing note reviewed. Exam conducted with a chaperone present.  Constitutional:      Appearance: Normal appearance.  Cardiovascular:     Rate and Rhythm: Normal rate and regular rhythm.  Pulses: Normal pulses.     Heart sounds: Normal heart sounds.  Pulmonary:     Effort: Pulmonary effort is normal.     Breath sounds: Normal breath sounds.  Abdominal:     Palpations: Abdomen is soft. There is no hepatomegaly, splenomegaly or mass.     Tenderness: There is no abdominal tenderness.  Musculoskeletal:     Right lower leg: No edema.     Left lower leg: No edema.  Lymphadenopathy:     Cervical: No cervical adenopathy.     Right cervical: No superficial, deep or posterior cervical adenopathy.    Left cervical: No superficial, deep or posterior cervical adenopathy.     Upper Body:     Right upper body: No supraclavicular or axillary adenopathy.     Left  upper body: No supraclavicular or axillary adenopathy.  Neurological:     General: No focal deficit present.     Mental Status: She is alert and oriented to person, place, and time.  Psychiatric:        Mood and Affect: Mood normal.        Behavior: Behavior normal.     LABS:      Latest Ref Rng & Units 09/24/2022    9:10 AM 09/03/2022    9:50 AM 08/13/2022   10:03 AM  CBC  WBC 4.0 - 10.5 K/uL 4.1  4.0  3.7   Hemoglobin 12.0 - 15.0 g/dL 16.1  09.6  04.5   Hematocrit 36.0 - 46.0 % 39.1  36.9  36.7   Platelets 150 - 400 K/uL 151  132  130       Latest Ref Rng & Units 09/24/2022    9:10 AM 09/03/2022    9:50 AM 08/13/2022   10:03 AM  CMP  Glucose 70 - 99 mg/dL 409  811  94   BUN 8 - 23 mg/dL 15  15  16    Creatinine 0.44 - 1.00 mg/dL 9.14  7.82  9.56   Sodium 135 - 145 mmol/L 134  133  134   Potassium 3.5 - 5.1 mmol/L 3.9  4.4  4.1   Chloride 98 - 111 mmol/L 100  97  101   CO2 22 - 32 mmol/L 26  29  27    Calcium 8.9 - 10.3 mg/dL 8.8  9.0  8.4   Total Protein 6.5 - 8.1 g/dL 6.4  6.3  6.1   Total Bilirubin 0.3 - 1.2 mg/dL 0.8  0.8  0.7   Alkaline Phos 38 - 126 U/L 77  83  70   AST 15 - 41 U/L 25  21  21    ALT 0 - 44 U/L 14  13  12       No results found for: "CEA1", "CEA" / No results found for: "CEA1", "CEA" No results found for: "PSA1" No results found for: "OZH086" No results found for: "CAN125"  No results found for: "TOTALPROTELP", "ALBUMINELP", "A1GS", "A2GS", "BETS", "BETA2SER", "GAMS", "MSPIKE", "SPEI" No results found for: "TIBC", "FERRITIN", "IRONPCTSAT" Lab Results  Component Value Date   LDH 137 06/05/2022   LDH 144 05/13/2022   LDH 168 11/05/2021     STUDIES:   No results found.

## 2022-09-24 ENCOUNTER — Ambulatory Visit: Payer: Medicare Other | Admitting: Hematology

## 2022-09-24 ENCOUNTER — Inpatient Hospital Stay: Payer: Medicare Other | Attending: Hematology

## 2022-09-24 ENCOUNTER — Inpatient Hospital Stay (HOSPITAL_BASED_OUTPATIENT_CLINIC_OR_DEPARTMENT_OTHER): Payer: Medicare Other | Admitting: Hematology

## 2022-09-24 ENCOUNTER — Inpatient Hospital Stay: Payer: Medicare Other

## 2022-09-24 ENCOUNTER — Other Ambulatory Visit: Payer: Medicare Other

## 2022-09-24 ENCOUNTER — Ambulatory Visit: Payer: Medicare Other

## 2022-09-24 VITALS — BP 125/75 | HR 55 | Resp 16

## 2022-09-24 DIAGNOSIS — Z8679 Personal history of other diseases of the circulatory system: Secondary | ICD-10-CM | POA: Diagnosis not present

## 2022-09-24 DIAGNOSIS — Z95828 Presence of other vascular implants and grafts: Secondary | ICD-10-CM | POA: Diagnosis not present

## 2022-09-24 DIAGNOSIS — E039 Hypothyroidism, unspecified: Secondary | ICD-10-CM | POA: Insufficient documentation

## 2022-09-24 DIAGNOSIS — Z803 Family history of malignant neoplasm of breast: Secondary | ICD-10-CM | POA: Insufficient documentation

## 2022-09-24 DIAGNOSIS — C4371 Malignant melanoma of right lower limb, including hip: Secondary | ICD-10-CM | POA: Diagnosis not present

## 2022-09-24 DIAGNOSIS — Z800 Family history of malignant neoplasm of digestive organs: Secondary | ICD-10-CM | POA: Insufficient documentation

## 2022-09-24 DIAGNOSIS — C7951 Secondary malignant neoplasm of bone: Secondary | ICD-10-CM | POA: Diagnosis not present

## 2022-09-24 DIAGNOSIS — Z5112 Encounter for antineoplastic immunotherapy: Secondary | ICD-10-CM | POA: Insufficient documentation

## 2022-09-24 DIAGNOSIS — M79651 Pain in right thigh: Secondary | ICD-10-CM | POA: Diagnosis not present

## 2022-09-24 DIAGNOSIS — Z79620 Long term (current) use of immunosuppressive biologic: Secondary | ICD-10-CM | POA: Diagnosis not present

## 2022-09-24 DIAGNOSIS — I514 Myocarditis, unspecified: Secondary | ICD-10-CM

## 2022-09-24 LAB — CBC WITH DIFFERENTIAL/PLATELET
Abs Immature Granulocytes: 0 10*3/uL (ref 0.00–0.07)
Basophils Absolute: 0 10*3/uL (ref 0.0–0.1)
Basophils Relative: 1 %
Eosinophils Absolute: 0.1 10*3/uL (ref 0.0–0.5)
Eosinophils Relative: 2 %
HCT: 39.1 % (ref 36.0–46.0)
Hemoglobin: 12.9 g/dL (ref 12.0–15.0)
Immature Granulocytes: 0 %
Lymphocytes Relative: 25 %
Lymphs Abs: 1.1 10*3/uL (ref 0.7–4.0)
MCH: 30.5 pg (ref 26.0–34.0)
MCHC: 33 g/dL (ref 30.0–36.0)
MCV: 92.4 fL (ref 80.0–100.0)
Monocytes Absolute: 0.3 10*3/uL (ref 0.1–1.0)
Monocytes Relative: 8 %
Neutro Abs: 2.6 10*3/uL (ref 1.7–7.7)
Neutrophils Relative %: 64 %
Platelets: 151 10*3/uL (ref 150–400)
RBC: 4.23 MIL/uL (ref 3.87–5.11)
RDW: 12.9 % (ref 11.5–15.5)
WBC: 4.1 10*3/uL (ref 4.0–10.5)
nRBC: 0 % (ref 0.0–0.2)

## 2022-09-24 LAB — COMPREHENSIVE METABOLIC PANEL
ALT: 14 U/L (ref 0–44)
AST: 25 U/L (ref 15–41)
Albumin: 3.6 g/dL (ref 3.5–5.0)
Alkaline Phosphatase: 77 U/L (ref 38–126)
Anion gap: 8 (ref 5–15)
BUN: 15 mg/dL (ref 8–23)
CO2: 26 mmol/L (ref 22–32)
Calcium: 8.8 mg/dL — ABNORMAL LOW (ref 8.9–10.3)
Chloride: 100 mmol/L (ref 98–111)
Creatinine, Ser: 0.97 mg/dL (ref 0.44–1.00)
GFR, Estimated: 58 mL/min — ABNORMAL LOW (ref >60)
Glucose, Bld: 128 mg/dL — ABNORMAL HIGH (ref 70–99)
Potassium: 3.9 mmol/L (ref 3.5–5.1)
Sodium: 134 mmol/L — ABNORMAL LOW (ref 135–145)
Total Bilirubin: 0.8 mg/dL (ref 0.3–1.2)
Total Protein: 6.4 g/dL — ABNORMAL LOW (ref 6.5–8.1)

## 2022-09-24 LAB — MAGNESIUM: Magnesium: 2 mg/dL (ref 1.7–2.4)

## 2022-09-24 LAB — TROPONIN I (HIGH SENSITIVITY): Troponin I (High Sensitivity): 7 ng/L (ref <18)

## 2022-09-24 LAB — TSH: TSH: 1.656 u[IU]/mL (ref 0.350–4.500)

## 2022-09-24 MED ORDER — SODIUM CHLORIDE 0.9% FLUSH
10.0000 mL | Freq: Once | INTRAVENOUS | Status: AC
  Administered 2022-09-24: 10 mL via INTRAVENOUS

## 2022-09-24 MED ORDER — HEPARIN SOD (PORK) LOCK FLUSH 100 UNIT/ML IV SOLN
500.0000 [IU] | Freq: Once | INTRAVENOUS | Status: AC | PRN
  Administered 2022-09-24: 500 [IU]

## 2022-09-24 MED ORDER — SODIUM CHLORIDE 0.9 % IV SOLN: Freq: Once | INTRAVENOUS | Status: AC

## 2022-09-24 MED ORDER — SODIUM CHLORIDE 0.9% FLUSH
10.0000 mL | INTRAVENOUS | Status: DC | PRN
  Administered 2022-09-24: 10 mL

## 2022-09-24 MED ORDER — SODIUM CHLORIDE 0.9 % IV SOLN
200.0000 mg | Freq: Once | INTRAVENOUS | Status: AC
  Administered 2022-09-24: 200 mg via INTRAVENOUS
  Filled 2022-09-24: qty 8

## 2022-09-24 NOTE — Progress Notes (Signed)
Patient has been examined by Dr. Katragadda. Vital signs and labs have been reviewed by MD - ANC, Creatinine, LFTs, hemoglobin, and platelets are within treatment parameters per M.D. - pt may proceed with treatment.  Primary RN and pharmacy notified.  

## 2022-09-24 NOTE — Patient Instructions (Signed)
[facility] at [facility] Discharge Instructions   You were seen and examined today by Dr. Katragadda.  He reviewed the results of your lab work which are normal/stable.   We will proceed with your treatment today.  Return as scheduled.    Thank you for choosing [facility] at [facility] to provide your oncology and hematology care.  To afford each patient quality time with our provider, please arrive at least 15 minutes before your scheduled appointment time.   If you have a lab appointment with the Cancer Center please come in thru the Main Entrance and check in at the main information desk.  You need to re-schedule your appointment should you arrive 10 or more minutes late.  We strive to give you quality time with our providers, and arriving late affects you and other patients whose appointments are after yours.  Also, if you no show three or more times for appointments you may be dismissed from the clinic at the providers discretion.     Again, thank you for choosing [facility].  Our hope is that these requests will decrease the amount of time that you wait before being seen by our physicians.       _____________________________________________________________  Should you have questions after your visit to [facility], please contact our office at [phone] and follow the prompts.  Our office hours are 8:00 a.m. and 4:30 p.m. Monday - Friday.  Please note that voicemails left after 4:00 p.m. may not be returned until the following business day.  We are closed weekends and major holidays.  You do have access to a nurse 24-7, just call the main number to the clinic [phone] and do not press any options, hold on the line and a nurse will answer the phone.    For prescription refill requests, have your pharmacy contact our office and allow 72 hours.    Due to Covid, you will need to wear a mask upon entering  the hospital. If you do not have a mask, a mask will be given to you at the Main Entrance upon arrival. For doctor visits, patients may have 1 support person age 18 or older with them. For treatment visits, patients can not have anyone with them due to social distancing guidelines and our immunocompromised population.      

## 2022-09-24 NOTE — Progress Notes (Signed)
Labs reviewed with MD today at office visit. Ok to treat per MD.   Treatment given per orders. Patient tolerated it well without problems. Vitals stable and discharged home from clinic ambulatory. Follow up as scheduled.  

## 2022-09-24 NOTE — Patient Instructions (Signed)
[facility]  Discharge Instructions: Thank you for choosing [facility] to provide your oncology and hematology care.  If you have a lab appointment with the Cancer Center - please note that after April 8th, 2024, all labs will be drawn in the cancer center.  You do not have to check in or register with the main entrance as you have in the past but will complete your check-in in the cancer center.  Wear comfortable clothing and clothing appropriate for easy access to any Portacath or PICC line.   We strive to give you quality time with your provider. You may need to reschedule your appointment if you arrive late (15 or more minutes).  Arriving late affects you and other patients whose appointments are after yours.  Also, if you miss three or more appointments without notifying the office, you may be dismissed from the clinic at the provider's discretion.      For prescription refill requests, have your pharmacy contact our office and allow 72 hours for refills to be completed.    Today you received the following chemotherapy and/or immunotherapy agents Keytruda      To help prevent nausea and vomiting after your treatment, we encourage you to take your nausea medication as directed.  BELOW ARE SYMPTOMS THAT SHOULD BE REPORTED IMMEDIATELY: *FEVER GREATER THAN 100.4 F (38 C) OR HIGHER *CHILLS OR SWEATING *NAUSEA AND VOMITING THAT IS NOT CONTROLLED WITH YOUR NAUSEA MEDICATION *UNUSUAL SHORTNESS OF BREATH *UNUSUAL BRUISING OR BLEEDING *URINARY PROBLEMS (pain or burning when urinating, or frequent urination) *BOWEL PROBLEMS (unusual diarrhea, constipation, pain near the anus) TENDERNESS IN MOUTH AND THROAT WITH OR WITHOUT PRESENCE OF ULCERS (sore throat, sores in mouth, or a toothache) UNUSUAL RASH, SWELLING OR PAIN  UNUSUAL VAGINAL DISCHARGE OR ITCHING   Items with * indicate a potential emergency and should be followed up as soon as possible or go to the  Emergency Department if any problems should occur.  Please show the CHEMOTHERAPY ALERT CARD or IMMUNOTHERAPY ALERT CARD at check-in to the Emergency Department and triage nurse.  Should you have questions after your visit or need to cancel or reschedule your appointment, please contact [facility] [phone]  and follow the prompts.  Office hours are 8:00 a.m. to 4:30 p.m. Monday - Friday. Please note that voicemails left after 4:00 p.m. may not be returned until the following business day.  We are closed weekends and major holidays. You have access to a nurse at all times for urgent questions. Please call the main number to the clinic [phone] and follow the prompts.  For any non-urgent questions, you may also contact your provider using MyChart. We now offer e-Visits for anyone 18 and older to request care online for non-urgent symptoms. For details visit [email or web address].   Also download the MyChart app! Go to the app store, search "MyChart", open the app, select [place], and log in with your MyChart username and password.   

## 2022-10-10 DIAGNOSIS — I10 Essential (primary) hypertension: Secondary | ICD-10-CM | POA: Diagnosis not present

## 2022-10-10 DIAGNOSIS — E7849 Other hyperlipidemia: Secondary | ICD-10-CM | POA: Diagnosis not present

## 2022-10-10 DIAGNOSIS — M810 Age-related osteoporosis without current pathological fracture: Secondary | ICD-10-CM | POA: Diagnosis not present

## 2022-10-14 ENCOUNTER — Other Ambulatory Visit: Payer: Medicare Other

## 2022-10-14 ENCOUNTER — Ambulatory Visit: Payer: Medicare Other

## 2022-10-14 DIAGNOSIS — I10 Essential (primary) hypertension: Secondary | ICD-10-CM | POA: Diagnosis not present

## 2022-10-14 DIAGNOSIS — E7849 Other hyperlipidemia: Secondary | ICD-10-CM | POA: Diagnosis not present

## 2022-10-14 DIAGNOSIS — Z0000 Encounter for general adult medical examination without abnormal findings: Secondary | ICD-10-CM | POA: Diagnosis not present

## 2022-10-14 DIAGNOSIS — M810 Age-related osteoporosis without current pathological fracture: Secondary | ICD-10-CM | POA: Diagnosis not present

## 2022-10-14 DIAGNOSIS — C439 Malignant melanoma of skin, unspecified: Secondary | ICD-10-CM | POA: Diagnosis not present

## 2022-10-15 ENCOUNTER — Inpatient Hospital Stay: Payer: Medicare Other | Attending: Hematology

## 2022-10-15 ENCOUNTER — Inpatient Hospital Stay: Payer: Medicare Other | Admitting: Hematology

## 2022-10-15 ENCOUNTER — Inpatient Hospital Stay: Payer: Medicare Other

## 2022-10-15 VITALS — BP 135/80 | HR 51 | Temp 97.2°F | Resp 16

## 2022-10-15 DIAGNOSIS — C4371 Malignant melanoma of right lower limb, including hip: Secondary | ICD-10-CM | POA: Diagnosis not present

## 2022-10-15 DIAGNOSIS — Z79620 Long term (current) use of immunosuppressive biologic: Secondary | ICD-10-CM | POA: Diagnosis not present

## 2022-10-15 DIAGNOSIS — I514 Myocarditis, unspecified: Secondary | ICD-10-CM

## 2022-10-15 DIAGNOSIS — Z5112 Encounter for antineoplastic immunotherapy: Secondary | ICD-10-CM | POA: Insufficient documentation

## 2022-10-15 DIAGNOSIS — Z95828 Presence of other vascular implants and grafts: Secondary | ICD-10-CM

## 2022-10-15 LAB — CBC WITH DIFFERENTIAL/PLATELET
Abs Immature Granulocytes: 0.01 10*3/uL (ref 0.00–0.07)
Basophils Absolute: 0 10*3/uL (ref 0.0–0.1)
Basophils Relative: 1 %
Eosinophils Absolute: 0.1 10*3/uL (ref 0.0–0.5)
Eosinophils Relative: 2 %
HCT: 38.5 % (ref 36.0–46.0)
Hemoglobin: 12.8 g/dL (ref 12.0–15.0)
Immature Granulocytes: 0 %
Lymphocytes Relative: 28 %
Lymphs Abs: 1.2 10*3/uL (ref 0.7–4.0)
MCH: 30.2 pg (ref 26.0–34.0)
MCHC: 33.2 g/dL (ref 30.0–36.0)
MCV: 90.8 fL (ref 80.0–100.0)
Monocytes Absolute: 0.4 10*3/uL (ref 0.1–1.0)
Monocytes Relative: 8 %
Neutro Abs: 2.6 10*3/uL (ref 1.7–7.7)
Neutrophils Relative %: 61 %
Platelets: 156 10*3/uL (ref 150–400)
RBC: 4.24 MIL/uL (ref 3.87–5.11)
RDW: 12.3 % (ref 11.5–15.5)
WBC: 4.3 10*3/uL (ref 4.0–10.5)
nRBC: 0 % (ref 0.0–0.2)

## 2022-10-15 LAB — TROPONIN I (HIGH SENSITIVITY): Troponin I (High Sensitivity): 6 ng/L (ref <18)

## 2022-10-15 LAB — COMPREHENSIVE METABOLIC PANEL
ALT: 14 U/L (ref 0–44)
AST: 24 U/L (ref 15–41)
Albumin: 3.5 g/dL (ref 3.5–5.0)
Alkaline Phosphatase: 96 U/L (ref 38–126)
Anion gap: 7 (ref 5–15)
BUN: 13 mg/dL (ref 8–23)
CO2: 27 mmol/L (ref 22–32)
Calcium: 8.6 mg/dL — ABNORMAL LOW (ref 8.9–10.3)
Chloride: 100 mmol/L (ref 98–111)
Creatinine, Ser: 0.91 mg/dL (ref 0.44–1.00)
GFR, Estimated: >60 mL/min (ref >60)
Glucose, Bld: 132 mg/dL — ABNORMAL HIGH (ref 70–99)
Potassium: 4.1 mmol/L (ref 3.5–5.1)
Sodium: 134 mmol/L — ABNORMAL LOW (ref 135–145)
Total Bilirubin: 0.6 mg/dL (ref 0.3–1.2)
Total Protein: 6.4 g/dL — ABNORMAL LOW (ref 6.5–8.1)

## 2022-10-15 LAB — MAGNESIUM: Magnesium: 1.9 mg/dL (ref 1.7–2.4)

## 2022-10-15 LAB — TSH: TSH: 1.779 u[IU]/mL (ref 0.350–4.500)

## 2022-10-15 MED ORDER — SODIUM CHLORIDE 0.9% FLUSH
10.0000 mL | INTRAVENOUS | Status: DC | PRN
  Administered 2022-10-15: 10 mL

## 2022-10-15 MED ORDER — HEPARIN SOD (PORK) LOCK FLUSH 100 UNIT/ML IV SOLN
500.0000 [IU] | Freq: Once | INTRAVENOUS | Status: AC | PRN
  Administered 2022-10-15: 500 [IU]

## 2022-10-15 MED ORDER — SODIUM CHLORIDE 0.9% FLUSH
10.0000 mL | Freq: Once | INTRAVENOUS | Status: AC
  Administered 2022-10-15: 10 mL via INTRAVENOUS

## 2022-10-15 MED ORDER — SODIUM CHLORIDE 0.9 % IV SOLN: Freq: Once | INTRAVENOUS | Status: AC

## 2022-10-15 MED ORDER — SODIUM CHLORIDE 0.9 % IV SOLN
200.0000 mg | Freq: Once | INTRAVENOUS | Status: AC
  Administered 2022-10-15: 200 mg via INTRAVENOUS
  Filled 2022-10-15: qty 8

## 2022-10-15 NOTE — Patient Instructions (Signed)
[facility] [place]  Discharge Instructions: Thank you for choosing [facility] to provide your oncology and hematology care.  If you have a lab appointment with the Cancer Center - please note that after April 8th, 2024, all labs will be drawn in the cancer center.  You do not have to check in or register with the main entrance as you have in the past but will complete your check-in in the cancer center.  Wear comfortable clothing and clothing appropriate for easy access to any Portacath or PICC line.   We strive to give you quality time with your provider. You may need to reschedule your appointment if you arrive late (15 or more minutes).  Arriving late affects you and other patients whose appointments are after yours.  Also, if you miss three or more appointments without notifying the office, you may be dismissed from the clinic at the provider's discretion.      For prescription refill requests, have your pharmacy contact our office and allow 72 hours for refills to be completed.    Today you received the following chemotherapy and/or immunotherapy agents Keytruda      To help prevent nausea and vomiting after your treatment, we encourage you to take your nausea medication as directed.  BELOW ARE SYMPTOMS THAT SHOULD BE REPORTED IMMEDIATELY: *FEVER GREATER THAN 100.4 F (38 C) OR HIGHER *CHILLS OR SWEATING *NAUSEA AND VOMITING THAT IS NOT CONTROLLED WITH YOUR NAUSEA MEDICATION *UNUSUAL SHORTNESS OF BREATH *UNUSUAL BRUISING OR BLEEDING *URINARY PROBLEMS (pain or burning when urinating, or frequent urination) *BOWEL PROBLEMS (unusual diarrhea, constipation, pain near the anus) TENDERNESS IN MOUTH AND THROAT WITH OR WITHOUT PRESENCE OF ULCERS (sore throat, sores in mouth, or a toothache) UNUSUAL RASH, SWELLING OR PAIN  UNUSUAL VAGINAL DISCHARGE OR ITCHING   Items with * indicate a potential emergency and should be followed up as soon as possible or go to the  Emergency Department if any problems should occur.  Please show the CHEMOTHERAPY ALERT CARD or IMMUNOTHERAPY ALERT CARD at check-in to the Emergency Department and triage nurse.  Should you have questions after your visit or need to cancel or reschedule your appointment, please contact [facility] AT  [phone]  and follow the prompts.  Office hours are 8:00 a.m. to 4:30 p.m. Monday - Friday. Please note that voicemails left after 4:00 p.m. may not be returned until the following business day.  We are closed weekends and major holidays. You have access to a nurse at all times for urgent questions. Please call the main number to the clinic [phone] and follow the prompts.  For any non-urgent questions, you may also contact your provider using MyChart. We now offer e-Visits for anyone 18 and older to request care online for non-urgent symptoms. For details visit [email or web address].   Also download the MyChart app! Go to the app store, search "MyChart", open the app, select [place], and log in with your MyChart username and password.   

## 2022-10-15 NOTE — Progress Notes (Signed)
Labs reviewed, meet treatment parameters. No new issues reported by patient today.   Treatment given per orders. Patient tolerated it well without problems. Vitals stable and discharged home from clinic ambulatory. Follow up as scheduled.

## 2022-11-03 DIAGNOSIS — M810 Age-related osteoporosis without current pathological fracture: Secondary | ICD-10-CM | POA: Diagnosis not present

## 2022-11-04 NOTE — Progress Notes (Signed)
[facility] [address] [place] [address]    Clinic Day:  11/05/2022  Referring physician: [name], MD  Patient Care Team: [name], MD as PCP - General (Internal Medicine) [name], MD as Medical Oncologist (Medical Oncology) [name], RN as Oncology Nurse Navigator (Oncology)   ASSESSMENT & PLAN:   Assessment: 1. Malignant melanoma of right posterior leg (pT4 pN3 M1): - She noticed fleshy lesion 4 months ago on the right leg. - Her PMD did a biopsy which was consistent with melanoma. - Wide local excision and lymph node biopsy by Dr. Donell Beers on 02/12/2021. - Pathology margins free, nodular type, Breslow's thickness 9.52 mm, Clark level V, deep margins free, ulceration absent, satellitosis absent, mitotic index 2/millimeters squared, LVI negative.  Neurotropism absent.  Tumor infiltrating lymphocytes absent.  Tumor regression not noted.  3 out of 3 positive sentinel lymph nodes for melanoma. - PET CT scan on 03/22/2021 with 3 foci of intense radiotracer activity within the right lower extremity.  In the medial right upper thigh nodule just superficial to thigh musculature measuring 11 mm with SUV 7.6.  Intramedullary hypermetabolic activity within the shaft of the mid right femur with SUV 14.6.  Intense activity associated with condylar notch of the distal right femur, appears to localize to soft tissue rather than the bone.  More diffuse activity associated with the medial right calf related to excision biopsy.  There is a focus of activity adjacent to the lateral margin of the right iliac wing SUV 4.1.  No evidence of visceral metastatic disease in the chest, abdomen or pelvis. 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 - NGS testing-BRAF negative, positive for NRAS  pathogenic variant exon 3, TERT promoter.  MSI-stable.  MMR-proficient.  NTRK 1/2/3 fusion not detected.  KIT mutation negative.  PD-L1 negative. - Nivolumab-Relatlimab every 28 days started on 05/09/2021.  Held permanently after first dose due to myocarditis. - PET scan on 07/25/2021: Slight progression with no new sites of metastatic disease. - Pembrolizumab started on 09/03/2021.  XRT to the soft tissue nodule within the medial right thigh and midshaft right femoral lesion from 03/26/2022 through 04/09/2022.   2. Social/family history: - She is seen with her son today.  She lives by herself at home.  She is independent of ADLs and IADLs.  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  She is non-smoker. - Father had colon cancer.  Sister had breast cancer and colon cancer.  Maternal aunt had breast cancer.  Paternal uncle had colon cancer.    Plan: 1. Malignant melanoma of the right posterior leg (pT4 pN3 M1), BRAF V600 negative: - PET scan on 07/17/2022: Interval response. - Denies any immunotherapy related side effects. - Reviewed labs today: Normal LFTs and creatinine.  Troponin was 8.  CBC was grossly normal. - Proceed with treatment today and in 3 weeks.  RTC 6 weeks for follow-up. - Will plan on repeating PET scan in 6 months.  2.  History of immunotherapy myocarditis: - Denies any chest pains or left arm pain.  Troponin is 8 today.  Continue monitoring troponin every cycle.  3.  Right medial thigh pain: -Occasional burning sensation in the medial right thigh is stable.  No new pains reported.  4.  Hypothyroidism: - Continue Synthroid 100 mcg daily.  TSH today is elevated at 9.482.  Will closely monitor and recheck at next treatment day.    Orders Placed This Encounter  Procedures   NM PET Image Restage (PS) Whole Body    Standing Status:   Future    Standing Expiration Date:   11/04/2023    Order Specific Question:   If indicated for the  ordered procedure, I authorize the administration of a radiopharmaceutical per Radiology protocol    Answer:   Yes    Order Specific Question:   Preferred imaging location?    Answer:   [facility]      I,[name],acting as a Neurosurgeon for [employer], MD.,have documented all relevant documentation on the behalf of [name], MD,as directed by  [name], MD while in the presence of [name], MD.   I, [name] MD, have reviewed the above documentation for accuracy and completeness, and I agree with the above.   [name], MD   6/26/20245:09 PM  CHIEF COMPLAINT:   Diagnosis: metastatic malignant melanoma, BRAF V600 negative    Cancer Staging  Malignant melanoma of lower leg, right (HCC) Staging form: Melanoma of the Skin, AJCC 8th Edition - Clinical stage from 03/27/2021: Stage IV (cT4a, cN3, cM1) - Unsigned    Prior Therapy: none  Current Therapy:  Pembrolizumab (200) q21d Keytruda    HISTORY OF PRESENT ILLNESS:   Oncology History  Malignant melanoma of lower leg, right (HCC)  03/27/2021 Initial Diagnosis   Malignant melanoma of lower leg, right (HCC)   05/09/2021 - 05/09/2021 Chemotherapy   Patient is on Treatment Plan : MELANOMA - Opdualag (nivolumab/relatlimab) q28d     09/03/2021 - 01/07/2022 Chemotherapy   Patient is on Treatment Plan : MELANOMA Pembrolizumab (200) q21d     09/03/2021 -  Chemotherapy   Patient is on Treatment Plan : MELANOMA Pembrolizumab (200) q21d        INTERVAL HISTORY:   Elizabeth Norman is a 82 y.o. female presenting to clinic today for follow up of metastatic malignant melanoma, BRAF V600 negative. She was last seen by me on 09/24/22.  Today, she states that she is doing well overall. Her appetite level is at 100%. Her energy level is at 75%.  PAST MEDICAL HISTORY:   Past Medical History: Past Medical History:  Diagnosis Date   Arthritis    Hypertension    Malignant melanoma of  lower leg, right (HCC) 03/27/2021   Port-A-Cath in place 04/30/2021    Surgical History: Past Surgical History:  Procedure Laterality Date   CATARACT EXTRACTION W/PHACO Right 10/03/2014   Procedure: CATARACT EXTRACTION PHACO AND INTRAOCULAR LENS PLACEMENT (IOC);  Surgeon: Jethro Bolus, MD;  Location: [facility];  Service: Ophthalmology;  Laterality: Right;  CDE:10.09   CATARACT EXTRACTION W/PHACO Left 10/10/2014   Procedure: CATARACT EXTRACTION PHACO AND INTRAOCULAR LENS PLACEMENT (IOC);  Surgeon: Jethro Bolus, MD;  Location: [facility];  Service: Ophthalmology;  Laterality: Left;  CDE:8.69   CORNEAL 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   CORNEAL TRANSPLANT Left 2020   DENTAL RESTORATION/EXTRACTION WITH X-RAY     IR IMAGING GUIDED PORT INSERTION  04/05/2021   LEFT HEART CATH AND CORONARY ANGIOGRAPHY N/A 05/28/2021   Procedure: LEFT HEART CATH AND CORONARY ANGIOGRAPHY;  Surgeon: [name], MD;  Location: [facility]VAS[facility];  Service: Cardiovascular;  Laterality: N/A;   MELANOMA EXCISION Right 02/12/2021   Procedure: WIDE LOCAL EXCISION RIGHT LOWER LEG MELANOMA , ADVANCEMENT FLAP CLOSURE DEFECT;  Surgeon: Almond Lint, MD;  Location: [facility] OR;  Service: General;  Laterality: Right;   SENTINEL NODE BIOPSY Right 02/12/2021   Procedure: SENTINEL NODE BIOPSY;  Surgeon: Almond Lint, MD;  Location: [facility] OR;  Service: General;  Laterality: Right;    Social History: Social History   Socioeconomic History   Marital status: Widowed    Spouse name: Not on file   Number of children: 3   Years of education: Not on file   Highest education level: Not on file  Occupational History   Not on file  Tobacco Use   Smoking status: Never   Smokeless tobacco: Never  Vaping Use   Vaping Use: Never used  Substance and Sexual Activity   Alcohol use: No   Drug use: No   Sexual activity: Not on file  Other Topics Concern   Not on file  Social History Narrative   Not on file   Social Determinants of Health    Financial Resource Strain: Not on file  Food Insecurity: Not on file  Transportation Needs: Not on file  Physical Activity: Not on file  Stress: Not on file  Social Connections: Not on file  Intimate Partner Violence: Not on file    Family History: Family History  Problem Relation Age of Onset   Cancer Father    Hypertension Sister    Hypertension Sister     Current Medications:  Current Outpatient Medications:    famotidine (PEPCID) 20 MG tablet, Take 20 mg by mouth daily., Disp: , Rfl:    alendronate (FOSAMAX) 70 MG tablet, Take 70 mg by mouth once a week., Disp: , Rfl: 0   ALPRAZolam (XANAX) 0.5 MG tablet, Take 0.5 mg by mouth 3 (three) times daily., Disp: , Rfl: 2   Aspirin Buf,CaCarb-MgCarb-MgO, (BUFFERIN PO), Take 400 mg by mouth daily at 2 PM., Disp: , Rfl:    calcium carbonate (OS-CAL - DOSED IN MG OF ELEMENTAL CALCIUM) 1250 (500 CA) MG tablet, Take 1 tablet by mouth 3 (three) times a week., Disp: , Rfl:    cholecalciferol (VITAMIN D) 1000 UNITS tablet, Take 1,000 Units by mouth 3 (three) times a week., Disp: , Rfl:    Cyanocobalamin (VITAMIN B-12 PO), Take 1 tablet by mouth 3 (three) times a week., Disp: , Rfl:    levothyroxine (SYNTHROID) 100 MCG tablet, Take 1 tablet by mouth daily before breakfast., Disp: 30 tablet, Rfl: 3   metoprolol tartrate (LOPRESSOR) 25 MG tablet, Take 25 mg by mouth 2 (two) times daily., Disp: , Rfl:    prednisoLONE acetate (PRED FORTE) 1 % ophthalmic suspension, Place 1 drop into both eyes See admin instructions. Instill one drop into the right eye twice daily and one drop into the left eye once daily., Disp: , Rfl:  No current facility-administered medications for this visit.  Facility-Administered Medications Ordered in Other Visits:    sodium chloride flush (NS) 0.9 % injection 10 mL, 10 mL, Intracatheter, PRN, [name], MD, 10 mL at 11/05/22 1250   Allergies: Allergies  Allergen Reactions   Tape     Pulls skin, please use  paper tape    REVIEW OF SYSTEMS:   Review of Systems  Constitutional:  Negative for chills, fatigue and fever.  HENT:   Negative for lump/mass, mouth sores, nosebleeds, sore throat and trouble swallowing.   Eyes:  Negative for eye problems.  Respiratory:  Negative for cough and shortness of breath.   Cardiovascular:  Negative for chest pain, leg swelling and  palpitations.  Gastrointestinal:  Positive for constipation. Negative for abdominal pain, diarrhea, nausea and vomiting.  Genitourinary:  Negative for bladder incontinence, difficulty urinating, dysuria, frequency, hematuria and nocturia.   Musculoskeletal:  Negative for arthralgias, back pain, flank pain, myalgias and neck pain.  Skin:  Negative for itching and rash.  Neurological:  Negative for dizziness, headaches and numbness.  Hematological:  Does not bruise/bleed easily.  Psychiatric/Behavioral:  Positive for sleep disturbance. Negative for depression and suicidal ideas. The patient is not nervous/anxious.   All other systems reviewed and are negative.    VITALS:   There were no vitals taken for this visit.  Wt Readings from Last 3 Encounters:  11/05/22 174 lb 6.4 oz (79.1 kg)  10/15/22 173 lb 9.6 oz (78.7 kg)  09/24/22 173 lb (78.5 kg)    There is no height or weight on file to calculate BMI.  Performance status (ECOG): 1 - Symptomatic but completely ambulatory  PHYSICAL EXAM:   Physical Exam Vitals and nursing note reviewed. Exam conducted with a chaperone present.  Constitutional:      Appearance: Normal appearance.  Cardiovascular:     Rate and Rhythm: Normal rate and regular rhythm.     Pulses: Normal pulses.     Heart sounds: Normal heart sounds.  Pulmonary:     Effort: Pulmonary effort is normal.     Breath sounds: Normal breath sounds.  Abdominal:     Palpations: Abdomen is soft. There is no hepatomegaly, splenomegaly or mass.     Tenderness: There is no abdominal tenderness.  Musculoskeletal:      Right lower leg: No edema.     Left lower leg: No edema.  Lymphadenopathy:     Cervical: No cervical adenopathy.     Right cervical: No superficial, deep or posterior cervical adenopathy.    Left cervical: No superficial, deep or posterior cervical adenopathy.     Upper Body:     Right upper body: No supraclavicular or axillary adenopathy.     Left upper body: No supraclavicular or axillary adenopathy.  Neurological:     General: No focal deficit present.     Mental Status: She is alert and oriented to person, place, and time.  Psychiatric:        Mood and Affect: Mood normal.        Behavior: Behavior normal.     LABS:      Latest Ref Rng & Units 11/05/2022   10:53 AM 10/15/2022   11:05 AM 09/24/2022    9:10 AM  CBC  WBC 4.0 - 10.5 K/uL 6.0  4.3  4.1   Hemoglobin 12.0 - 15.0 g/dL 14.7  82.9  56.2   Hematocrit 36.0 - 46.0 % 40.3  38.5  39.1   Platelets 150 - 400 K/uL 152  156  151       Latest Ref Rng & Units 11/05/2022   10:53 AM 10/15/2022   11:05 AM 09/24/2022    9:10 AM  CMP  Glucose 70 - 99 mg/dL 130  865  784   BUN 8 - 23 mg/dL 23  13  15    Creatinine 0.44 - 1.00 mg/dL 6.96  2.95  2.84   Sodium 135 - 145 mmol/L 135  134  134   Potassium 3.5 - 5.1 mmol/L 4.2  4.1  3.9   Chloride 98 - 111 mmol/L 99  100  100   CO2 22 - 32 mmol/L 28  27  26    Calcium 8.9 -  10.3 mg/dL 9.2  8.6  8.8   Total Protein 6.5 - 8.1 g/dL 7.1  6.4  6.4   Total Bilirubin 0.3 - 1.2 mg/dL 0.6  0.6  0.8   Alkaline Phos 38 - 126 U/L 86  96  77   AST 15 - 41 U/L 21  24  25    ALT 0 - 44 U/L 13  14  14       No results found for: "CEA1", "CEA" / No results found for: "CEA1", "CEA" No results found for: "PSA1" No results found for: "XLK440" No results found for: "CAN125"  No results found for: "TOTALPROTELP", "ALBUMINELP", "A1GS", "A2GS", "BETS", "BETA2SER", "GAMS", "MSPIKE", "SPEI" No results found for: "TIBC", "FERRITIN", "IRONPCTSAT" Lab Results  Component Value Date   LDH 137 06/05/2022   LDH  144 05/13/2022   LDH 168 11/05/2021     STUDIES:   No results found.

## 2022-11-05 ENCOUNTER — Inpatient Hospital Stay: Payer: Medicare Other

## 2022-11-05 ENCOUNTER — Other Ambulatory Visit: Payer: Medicare Other

## 2022-11-05 ENCOUNTER — Ambulatory Visit: Payer: Medicare Other | Admitting: Hematology

## 2022-11-05 ENCOUNTER — Inpatient Hospital Stay: Payer: Medicare Other | Admitting: Hematology

## 2022-11-05 VITALS — BP 121/61 | HR 60 | Temp 97.7°F | Resp 20

## 2022-11-05 DIAGNOSIS — Z95828 Presence of other vascular implants and grafts: Secondary | ICD-10-CM

## 2022-11-05 DIAGNOSIS — C4371 Malignant melanoma of right lower limb, including hip: Secondary | ICD-10-CM

## 2022-11-05 DIAGNOSIS — I514 Myocarditis, unspecified: Secondary | ICD-10-CM

## 2022-11-05 DIAGNOSIS — Z5112 Encounter for antineoplastic immunotherapy: Secondary | ICD-10-CM | POA: Diagnosis not present

## 2022-11-05 DIAGNOSIS — Z79620 Long term (current) use of immunosuppressive biologic: Secondary | ICD-10-CM | POA: Diagnosis not present

## 2022-11-05 LAB — CBC WITH DIFFERENTIAL/PLATELET
Abs Immature Granulocytes: 0.03 10*3/uL (ref 0.00–0.07)
Basophils Absolute: 0 10*3/uL (ref 0.0–0.1)
Basophils Relative: 1 %
Eosinophils Absolute: 0.1 10*3/uL (ref 0.0–0.5)
Eosinophils Relative: 2 %
HCT: 40.3 % (ref 36.0–46.0)
Hemoglobin: 13.3 g/dL (ref 12.0–15.0)
Immature Granulocytes: 1 %
Lymphocytes Relative: 22 %
Lymphs Abs: 1.3 10*3/uL (ref 0.7–4.0)
MCH: 29.8 pg (ref 26.0–34.0)
MCHC: 33 g/dL (ref 30.0–36.0)
MCV: 90.4 fL (ref 80.0–100.0)
Monocytes Absolute: 0.4 10*3/uL (ref 0.1–1.0)
Monocytes Relative: 7 %
Neutro Abs: 4.2 10*3/uL (ref 1.7–7.7)
Neutrophils Relative %: 67 %
Platelets: 152 10*3/uL (ref 150–400)
RBC: 4.46 MIL/uL (ref 3.87–5.11)
RDW: 12.5 % (ref 11.5–15.5)
WBC: 6 10*3/uL (ref 4.0–10.5)
nRBC: 0 % (ref 0.0–0.2)

## 2022-11-05 LAB — MAGNESIUM: Magnesium: 2 mg/dL (ref 1.7–2.4)

## 2022-11-05 LAB — COMPREHENSIVE METABOLIC PANEL
ALT: 13 U/L (ref 0–44)
AST: 21 U/L (ref 15–41)
Albumin: 3.8 g/dL (ref 3.5–5.0)
Alkaline Phosphatase: 86 U/L (ref 38–126)
Anion gap: 8 (ref 5–15)
BUN: 23 mg/dL (ref 8–23)
CO2: 28 mmol/L (ref 22–32)
Calcium: 9.2 mg/dL (ref 8.9–10.3)
Chloride: 99 mmol/L (ref 98–111)
Creatinine, Ser: 0.97 mg/dL (ref 0.44–1.00)
GFR, Estimated: 58 mL/min — ABNORMAL LOW (ref >60)
Glucose, Bld: 106 mg/dL — ABNORMAL HIGH (ref 70–99)
Potassium: 4.2 mmol/L (ref 3.5–5.1)
Sodium: 135 mmol/L (ref 135–145)
Total Bilirubin: 0.6 mg/dL (ref 0.3–1.2)
Total Protein: 7.1 g/dL (ref 6.5–8.1)

## 2022-11-05 LAB — TSH: TSH: 9.482 u[IU]/mL — ABNORMAL HIGH (ref 0.350–4.500)

## 2022-11-05 LAB — TROPONIN I (HIGH SENSITIVITY): Troponin I (High Sensitivity): 8 ng/L (ref <18)

## 2022-11-05 MED ORDER — SODIUM CHLORIDE 0.9% FLUSH
10.0000 mL | INTRAVENOUS | Status: DC | PRN
  Administered 2022-11-05: 10 mL

## 2022-11-05 MED ORDER — HEPARIN SOD (PORK) LOCK FLUSH 100 UNIT/ML IV SOLN
500.0000 [IU] | Freq: Once | INTRAVENOUS | Status: AC | PRN
  Administered 2022-11-05: 500 [IU]

## 2022-11-05 MED ORDER — SODIUM CHLORIDE 0.9 % IV SOLN
200.0000 mg | Freq: Once | INTRAVENOUS | Status: AC
  Administered 2022-11-05: 200 mg via INTRAVENOUS
  Filled 2022-11-05: qty 8

## 2022-11-05 MED ORDER — SODIUM CHLORIDE 0.9% FLUSH
10.0000 mL | Freq: Once | INTRAVENOUS | Status: AC
  Administered 2022-11-05: 10 mL via INTRAVENOUS

## 2022-11-05 MED ORDER — SODIUM CHLORIDE 0.9 % IV SOLN: Freq: Once | INTRAVENOUS | Status: AC

## 2022-11-05 NOTE — Patient Instructions (Signed)
[facility] [place]  Discharge Instructions: Thank you for choosing [facility] to provide your oncology and hematology care.  If you have a lab appointment with the Cancer Center - please note that after April 8th, 2024, all labs will be drawn in the cancer center.  You do not have to check in or register with the main entrance as you have in the past but will complete your check-in in the cancer center.  Wear comfortable clothing and clothing appropriate for easy access to any Portacath or PICC line.   We strive to give you quality time with your provider. You may need to reschedule your appointment if you arrive late (15 or more minutes).  Arriving late affects you and other patients whose appointments are after yours.  Also, if you miss three or more appointments without notifying the office, you may be dismissed from the clinic at the provider's discretion.      For prescription refill requests, have your pharmacy contact our office and allow 72 hours for refills to be completed.    Today you received the following chemotherapy and/or immunotherapy agents Keytruda      To help prevent nausea and vomiting after your treatment, we encourage you to take your nausea medication as directed.  BELOW ARE SYMPTOMS THAT SHOULD BE REPORTED IMMEDIATELY: *FEVER GREATER THAN 100.4 F (38 C) OR HIGHER *CHILLS OR SWEATING *NAUSEA AND VOMITING THAT IS NOT CONTROLLED WITH YOUR NAUSEA MEDICATION *UNUSUAL SHORTNESS OF BREATH *UNUSUAL BRUISING OR BLEEDING *URINARY PROBLEMS (pain or burning when urinating, or frequent urination) *BOWEL PROBLEMS (unusual diarrhea, constipation, pain near the anus) TENDERNESS IN MOUTH AND THROAT WITH OR WITHOUT PRESENCE OF ULCERS (sore throat, sores in mouth, or a toothache) UNUSUAL RASH, SWELLING OR PAIN  UNUSUAL VAGINAL DISCHARGE OR ITCHING   Items with * indicate a potential emergency and should be followed up as soon as possible or go to the  Emergency Department if any problems should occur.  Please show the CHEMOTHERAPY ALERT CARD or IMMUNOTHERAPY ALERT CARD at check-in to the Emergency Department and triage nurse.  Should you have questions after your visit or need to cancel or reschedule your appointment, please contact [facility] [place] [phone]  and follow the prompts.  Office hours are 8:00 a.m. to 4:30 p.m. Monday - Friday. Please note that voicemails left after 4:00 p.m. may not be returned until the following business day.  We are closed weekends and major holidays. You have access to a nurse at all times for urgent questions. Please call the main number to the clinic [phone] and follow the prompts.  For any non-urgent questions, you may also contact your provider using MyChart. We now offer e-Visits for anyone 18 and older to request care online for non-urgent symptoms. For details visit [email or web address].   Also download the MyChart app! Go to the app store, search "MyChart", open the app, select [facility], and log in with your MyChart username and password.   

## 2022-11-05 NOTE — Progress Notes (Signed)
Patient has been examined by Dr. Katragadda. Vital signs and labs have been reviewed by MD - ANC, Creatinine, LFTs, hemoglobin, and platelets are within treatment parameters per M.D. - pt may proceed with treatment.  Primary RN and pharmacy notified.  

## 2022-11-05 NOTE — Progress Notes (Signed)
Patient presents today for Keytruda infusion per providers order.  Labs and vital signs reviewed by the MD.  Message received from Chapman Moss RN/Dr.Katragadda patient okay for treatment.  Treatment given today per MD orders.  Stable during infusion without adverse affects.  Vital signs stable.  No complaints at this time.  Discharge from clinic ambulatory in stable condition.  Alert and oriented X 3.  Follow up with [facility] as scheduled.

## 2022-11-05 NOTE — Patient Instructions (Signed)
[facility] Discharge Instructions   You were seen and examined today by Dr. Katragadda.  He reviewed the results of your lab work which are normal/stable.   We will proceed with your treatment today.  Return as scheduled.    Thank you for choosing [facility] to provide your oncology and hematology care.  To afford each patient quality time with our provider, please arrive at least 15 minutes before your scheduled appointment time.   If you have a lab appointment with the Cancer Center please come in thru the Main Entrance and check in at the main information desk.  You need to re-schedule your appointment should you arrive 10 or more minutes late.  We strive to give you quality time with our providers, and arriving late affects you and other patients whose appointments are after yours.  Also, if you no show three or more times for appointments you may be dismissed from the clinic at the providers discretion.     Again, thank you for choosing [facility].  Our hope is that these requests will decrease the amount of time that you wait before being seen by our physicians.       _____________________________________________________________  Should you have questions after your visit to [facility], please contact our office at [phone] and follow the prompts.  Our office hours are 8:00 a.m. and 4:30 p.m. Monday - Friday.  Please note that voicemails left after 4:00 p.m. may not be returned until the following business day.  We are closed weekends and major holidays.  You do have access to a nurse 24-7, just call the main number to the clinic [phone] and do not press any options, hold on the line and a nurse will answer the phone.    For prescription refill requests, have your pharmacy contact our office and allow 72 hours.    Due to Covid, you will need to wear a mask upon entering  the hospital. If you do not have a mask, a mask will be given to you at the Main Entrance upon arrival. For doctor visits, patients may have 1 support person age 18 or older with them. For treatment visits, patients can not have anyone with them due to social distancing guidelines and our immunocompromised population.      

## 2022-11-07 LAB — T4: T4, Total: 7.5 ug/dL (ref 4.5–12.0)

## 2022-11-26 ENCOUNTER — Ambulatory Visit: Payer: Medicare Other | Admitting: Hematology

## 2022-11-26 ENCOUNTER — Inpatient Hospital Stay: Payer: Medicare Other | Attending: Hematology

## 2022-11-26 ENCOUNTER — Inpatient Hospital Stay: Payer: Medicare Other

## 2022-11-26 ENCOUNTER — Other Ambulatory Visit: Payer: Medicare Other

## 2022-11-26 VITALS — BP 109/69 | HR 61 | Temp 98.0°F | Resp 18

## 2022-11-26 DIAGNOSIS — C4371 Malignant melanoma of right lower limb, including hip: Secondary | ICD-10-CM | POA: Diagnosis not present

## 2022-11-26 DIAGNOSIS — I514 Myocarditis, unspecified: Secondary | ICD-10-CM

## 2022-11-26 DIAGNOSIS — Z79620 Long term (current) use of immunosuppressive biologic: Secondary | ICD-10-CM | POA: Diagnosis not present

## 2022-11-26 DIAGNOSIS — Z5112 Encounter for antineoplastic immunotherapy: Secondary | ICD-10-CM | POA: Diagnosis not present

## 2022-11-26 DIAGNOSIS — Z95828 Presence of other vascular implants and grafts: Secondary | ICD-10-CM

## 2022-11-26 LAB — CBC WITH DIFFERENTIAL/PLATELET
Abs Immature Granulocytes: 0.02 10*3/uL (ref 0.00–0.07)
Basophils Absolute: 0 10*3/uL (ref 0.0–0.1)
Basophils Relative: 1 %
Eosinophils Absolute: 0.1 10*3/uL (ref 0.0–0.5)
Eosinophils Relative: 2 %
HCT: 39.4 % (ref 36.0–46.0)
Hemoglobin: 12.9 g/dL (ref 12.0–15.0)
Immature Granulocytes: 0 %
Lymphocytes Relative: 24 %
Lymphs Abs: 1.4 10*3/uL (ref 0.7–4.0)
MCH: 29.7 pg (ref 26.0–34.0)
MCHC: 32.7 g/dL (ref 30.0–36.0)
MCV: 90.6 fL (ref 80.0–100.0)
Monocytes Absolute: 0.4 10*3/uL (ref 0.1–1.0)
Monocytes Relative: 6 %
Neutro Abs: 3.8 10*3/uL (ref 1.7–7.7)
Neutrophils Relative %: 67 %
Platelets: 165 10*3/uL (ref 150–400)
RBC: 4.35 MIL/uL (ref 3.87–5.11)
RDW: 12.7 % (ref 11.5–15.5)
WBC: 5.6 10*3/uL (ref 4.0–10.5)
nRBC: 0 % (ref 0.0–0.2)

## 2022-11-26 LAB — COMPREHENSIVE METABOLIC PANEL
ALT: 15 U/L (ref 0–44)
AST: 28 U/L (ref 15–41)
Albumin: 3.7 g/dL (ref 3.5–5.0)
Alkaline Phosphatase: 79 U/L (ref 38–126)
Anion gap: 5 (ref 5–15)
BUN: 13 mg/dL (ref 8–23)
CO2: 28 mmol/L (ref 22–32)
Calcium: 9.1 mg/dL (ref 8.9–10.3)
Chloride: 100 mmol/L (ref 98–111)
Creatinine, Ser: 0.97 mg/dL (ref 0.44–1.00)
GFR, Estimated: 58 mL/min — ABNORMAL LOW (ref >60)
Glucose, Bld: 128 mg/dL — ABNORMAL HIGH (ref 70–99)
Potassium: 4.1 mmol/L (ref 3.5–5.1)
Sodium: 133 mmol/L — ABNORMAL LOW (ref 135–145)
Total Bilirubin: 0.6 mg/dL (ref 0.3–1.2)
Total Protein: 6.9 g/dL (ref 6.5–8.1)

## 2022-11-26 LAB — MAGNESIUM: Magnesium: 2.2 mg/dL (ref 1.7–2.4)

## 2022-11-26 LAB — TSH: TSH: 14.172 u[IU]/mL — ABNORMAL HIGH (ref 0.350–4.500)

## 2022-11-26 LAB — TROPONIN I (HIGH SENSITIVITY): Troponin I (High Sensitivity): 6 ng/L (ref <18)

## 2022-11-26 MED ORDER — SODIUM CHLORIDE 0.9% FLUSH
10.0000 mL | INTRAVENOUS | Status: DC | PRN
  Administered 2022-11-26: 10 mL

## 2022-11-26 MED ORDER — SODIUM CHLORIDE 0.9 % IV SOLN
200.0000 mg | Freq: Once | INTRAVENOUS | Status: AC
  Administered 2022-11-26: 200 mg via INTRAVENOUS
  Filled 2022-11-26: qty 8

## 2022-11-26 MED ORDER — SODIUM CHLORIDE 0.9 % IV SOLN: Freq: Once | INTRAVENOUS | Status: AC

## 2022-11-26 MED ORDER — HEPARIN SOD (PORK) LOCK FLUSH 100 UNIT/ML IV SOLN
500.0000 [IU] | Freq: Once | INTRAVENOUS | Status: AC | PRN
  Administered 2022-11-26: 500 [IU]

## 2022-11-26 NOTE — Progress Notes (Signed)
 Keytruda given today per MD orders. Tolerated infusion without adverse affects. Vital signs stable. No complaints at this time. Discharged from clinic ambulatory in stable condition. Alert and oriented x 3. F/U with [facility] as scheduled.  

## 2022-11-26 NOTE — Patient Instructions (Signed)
[facility]  Discharge Instructions: Thank you for choosing [facility] to provide your oncology and hematology care.  If you have a lab appointment with the Cancer Center - please note that after April 8th, 2024, all labs will be drawn in the cancer center.  You do not have to check in or register with the main entrance as you have in the past but will complete your check-in in the cancer center.  Wear comfortable clothing and clothing appropriate for easy access to any Portacath or PICC line.   We strive to give you quality time with your provider. You may need to reschedule your appointment if you arrive late (15 or more minutes).  Arriving late affects you and other patients whose appointments are after yours.  Also, if you miss three or more appointments without notifying the office, you may be dismissed from the clinic at the provider's discretion.      For prescription refill requests, have your pharmacy contact our office and allow 72 hours for refills to be completed.    Today you received the following chemotherapy and/or immunotherapy agents keytruda. Pembrolizumab Injection What is this medication? PEMBROLIZUMAB (PEM broe LIZ ue mab) treats some types of cancer. It works by helping your immune system slow or stop the spread of cancer cells. It is a monoclonal antibody. This medicine may be used for other purposes; ask your health care provider or pharmacist if you have questions. COMMON BRAND NAME(S): Keytruda What should I tell my care team before I take this medication? They need to know if you have any of these conditions: Allogeneic stem cell transplant (uses someone else's stem cells) Autoimmune diseases, such as Crohn disease, ulcerative colitis, lupus History of chest radiation Nervous system problems, such as Guillain-Barre syndrome, myasthenia gravis Organ transplant An unusual or allergic reaction to pembrolizumab, other medications,  foods, dyes, or preservatives Pregnant or trying to get pregnant Breast-feeding How should I use this medication? This medication is injected into a vein. It is given by your care team in a hospital or clinic setting. A special MedGuide will be given to you before each treatment. Be sure to read this information carefully each time. Talk to your care team about the use of this medication in children. While it may be prescribed for children as young as 6 months for selected conditions, precautions do apply. Overdosage: If you think you have taken too much of this medicine contact a poison control center or emergency room at once. NOTE: This medicine is only for you. Do not share this medicine with others. What if I miss a dose? Keep appointments for follow-up doses. It is important not to miss your dose. Call your care team if you are unable to keep an appointment. What may interact with this medication? Interactions have not been studied. This list may not describe all possible interactions. Give your health care provider a list of all the medicines, herbs, non-prescription drugs, or dietary supplements you use. Also tell them if you smoke, drink alcohol, or use illegal drugs. Some items may interact with your medicine. What should I watch for while using this medication? Your condition will be monitored carefully while you are receiving this medication. You may need blood work while taking this medication. This medication may cause serious skin reactions. They can happen weeks to months after starting the medication. Contact your care team right away if you notice fevers or flu-like symptoms with a rash. The rash  may be red or purple and then turn into blisters or peeling of the skin. You may also notice a red rash with swelling of the face, lips, or lymph nodes in your neck or under your arms. Tell your care team right away if you have any change in your eyesight. Talk to your care team if you  may be pregnant. Serious birth defects can occur if you take this medication during pregnancy and for 4 months after the last dose. You will need a negative pregnancy test before starting this medication. Contraception is recommended while taking this medication and for 4 months after the last dose. Your care team can help you find the option that works for you. Do not breastfeed while taking this medication and for 4 months after the last dose. What side effects may I notice from receiving this medication? Side effects that you should report to your care team as soon as possible: Allergic reactions--skin rash, itching, hives, swelling of the face, lips, tongue, or throat Dry cough, shortness of breath or trouble breathing Eye pain, redness, irritation, or discharge with blurry or decreased vision Heart muscle inflammation--unusual weakness or fatigue, shortness of breath, chest pain, fast or irregular heartbeat, dizziness, swelling of the ankles, feet, or hands Hormone gland problems--headache, sensitivity to light, unusual weakness or fatigue, dizziness, fast or irregular heartbeat, increased sensitivity to cold or heat, excessive sweating, constipation, hair loss, increased thirst or amount of urine, tremors or shaking, irritability Infusion reactions--chest pain, shortness of breath or trouble breathing, feeling faint or lightheaded Kidney injury (glomerulonephritis)--decrease in the amount of urine, red or dark brown urine, foamy or bubbly urine, swelling of the ankles, hands, or feet Liver injury--right upper belly pain, loss of appetite, nausea, light-colored stool, dark yellow or brown urine, yellowing skin or eyes, unusual weakness or fatigue Pain, tingling, or numbness in the hands or feet, muscle weakness, change in vision, confusion or trouble speaking, loss of balance or coordination, trouble walking, seizures Rash, fever, and swollen lymph nodes Redness, blistering, peeling, or loosening  of the skin, including inside the mouth Sudden or severe stomach pain, bloody diarrhea, fever, nausea, vomiting Side effects that usually do not require medical attention (report to your care team if they continue or are bothersome): Bone, joint, or muscle pain Diarrhea Fatigue Loss of appetite Nausea Skin rash This list may not describe all possible side effects. Call your doctor for medical advice about side effects. You may report side effects to FDA at 1-800-FDA-1088. Where should I keep my medication? This medication is given in a hospital or clinic. It will not be stored at home. NOTE: This sheet is a summary. It may not cover all possible information. If you have questions about this medicine, talk to your doctor, pharmacist, or health care provider.  2024 Elsevier/Gold Standard (2021-09-10 00:00:00)       To help prevent nausea and vomiting after your treatment, we encourage you to take your nausea medication as directed.  BELOW ARE SYMPTOMS THAT SHOULD BE REPORTED IMMEDIATELY: *FEVER GREATER THAN 100.4 F (38 C) OR HIGHER *CHILLS OR SWEATING *NAUSEA AND VOMITING THAT IS NOT CONTROLLED WITH YOUR NAUSEA MEDICATION *UNUSUAL SHORTNESS OF BREATH *UNUSUAL BRUISING OR BLEEDING *URINARY PROBLEMS (pain or burning when urinating, or frequent urination) *BOWEL PROBLEMS (unusual diarrhea, constipation, pain near the anus) TENDERNESS IN MOUTH AND THROAT WITH OR WITHOUT PRESENCE OF ULCERS (sore throat, sores in mouth, or a toothache) UNUSUAL RASH, SWELLING OR PAIN  UNUSUAL VAGINAL DISCHARGE OR ITCHING  Items with * indicate a potential emergency and should be followed up as soon as possible or go to the Emergency Department if any problems should occur.  Please show the CHEMOTHERAPY ALERT CARD or IMMUNOTHERAPY ALERT CARD at check-in to the Emergency Department and triage nurse.  Should you have questions after your visit or need to cancel or reschedule your appointment, please contact  [facility] [phone]  and follow the prompts.  Office hours are 8:00 a.m. to 4:30 p.m. Monday - Friday. Please note that voicemails left after 4:00 p.m. may not be returned until the following business day.  We are closed weekends and major holidays. You have access to a nurse at all times for urgent questions. Please call the main number to the clinic [phone] and follow the prompts.  For any non-urgent questions, you may also contact your provider using MyChart. We now offer e-Visits for anyone 27 and older to request care online for non-urgent symptoms. For details visit [email or web address].   Also download the MyChart app! Go to the app store, search "MyChart", open the app, select [facility], and log in with your MyChart username and password.

## 2022-11-29 LAB — T4: T4, Total: 8.4 ug/dL (ref 4.5–12.0)

## 2022-12-16 NOTE — Progress Notes (Signed)
[facility] [address] [place] [address]    Clinic Day:  12/17/22   Referring physician: [name], MD  Patient Care Team: [name], MD as PCP - General (Internal Medicine) [name], MD as Medical Oncologist (Medical Oncology) [name], RN as Oncology Nurse Navigator (Oncology)   ASSESSMENT & PLAN:   Assessment: 1. Malignant melanoma of right posterior leg (pT4 pN3 M1): - She noticed fleshy lesion 4 months ago on the right leg. - Her PMD did a biopsy which was consistent with melanoma. - Wide local excision and lymph node biopsy by Dr. Donell Beers on 02/12/2021. - Pathology margins free, nodular type, Breslow's thickness 9.52 mm, Clark level V, deep margins free, ulceration absent, satellitosis absent, mitotic index 2/millimeters squared, LVI negative.  Neurotropism absent.  Tumor infiltrating lymphocytes absent.  Tumor regression not noted.  3 out of 3 positive sentinel lymph nodes for melanoma. - PET CT scan on 03/22/2021 with 3 foci of intense radiotracer activity within the right lower extremity.  In the medial right upper thigh nodule just superficial to thigh musculature measuring 11 mm with SUV 7.6.  Intramedullary hypermetabolic activity within the shaft of the mid right femur with SUV 14.6.  Intense activity associated with condylar notch of the distal right femur, appears to localize to soft tissue rather than the bone.  More diffuse activity associated with the medial right calf related to excision biopsy.  There is a focus of activity adjacent to the lateral margin of the right iliac wing SUV 4.1.  No evidence of visceral metastatic disease in the chest, abdomen or pelvis. 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 - NGS testing-BRAF negative, positive for NRAS  pathogenic variant exon 3, TERT promoter.  MSI-stable.  MMR-proficient.  NTRK 1/2/3 fusion not detected.  KIT mutation negative.  PD-L1 negative. - Nivolumab-Relatlimab every 28 days started on 05/09/2021.  Held permanently after first dose due to myocarditis. - PET scan on 07/25/2021: Slight progression with no new sites of metastatic disease. - Pembrolizumab started on 09/03/2021.  XRT to the soft tissue nodule within the medial right thigh and midshaft right femoral lesion from 03/26/2022 through 04/09/2022.   2. Social/family history: - She is seen with her son today.  She lives by herself at home.  She is independent of ADLs and IADLs.  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  She is non-smoker. - Father had colon cancer.  Sister had breast cancer and colon cancer.  Maternal aunt had breast cancer.  Paternal uncle had colon cancer.    Plan: 1. Malignant melanoma of the right posterior leg (pT4 pN3 M1), BRAF V600 negative: - PET scan on 07/17/2022: Interval response. - She does not have any immunotherapy related side effects.  She does have some acid reflux. - Labs today: Normal LFTs and creatinine.  CBC grossly normal. - Proceed with treatment today.  RTC 3 weeks.  I will plan to arrange for whole-body PET scan prior to next visit.  2.  History of immunotherapy myocarditis: - She does not have any chest pains or left arm pain.  We are checking troponin levels with each cycle.  Last troponin is 8.  3.  Right medial thigh pain: - Occasional burning sensation in the medial right thigh  is stable.  No new pains.  4.  Hypothyroidism: - She is taking Synthroid 100 mcg daily.  TSH today is 13.8. - Increase Synthroid to 137 mcg daily.  Will recheck TSH in 6 weeks.    Orders Placed This Encounter  Procedures   NM PET Image Restage (PS) Whole Body    Standing Status:   Future    Standing Expiration Date:   12/17/2023    Order Specific Question:   If  indicated for the ordered procedure, I authorize the administration of a radiopharmaceutical per Radiology protocol    Answer:   Yes    Order Specific Question:   Preferred imaging location?    Answer:   [facility]    Order Specific Question:   Release to patient    Answer:   Immediate      I,Helena R Teague,acting as a scribe for [name], MD.,have documented all relevant documentation on the behalf of [name], MD,as directed by  [name], MD while in the presence of [name], MD.  I, [name] MD, have reviewed the above documentation for accuracy and completeness, and I agree with the above.    [name], MD   8/7/20245:12 PM  CHIEF COMPLAINT:   Diagnosis: metastatic malignant melanoma, BRAF V600 negative    Cancer Staging  Malignant melanoma of lower leg, right (HCC) Staging form: Melanoma of the Skin, AJCC 8th Edition - Clinical stage from 03/27/2021: Stage IV (cT4a, cN3, cM1) - Unsigned    Prior Therapy: none  Current Therapy:  Pembrolizumab (200) q21d Keytruda    HISTORY OF PRESENT ILLNESS:   Oncology History  Malignant melanoma of lower leg, right (HCC)  03/27/2021 Initial Diagnosis   Malignant melanoma of lower leg, right (HCC)   05/09/2021 - 05/09/2021 Chemotherapy   Patient is on Treatment Plan : MELANOMA - Opdualag (nivolumab/relatlimab) q28d     09/03/2021 - 01/07/2022 Chemotherapy   Patient is on Treatment Plan : MELANOMA Pembrolizumab (200) q21d     09/03/2021 -  Chemotherapy   Patient is on Treatment Plan : MELANOMA Pembrolizumab (200) q21d        INTERVAL HISTORY:   Elizabeth Norman is a 82 y.o. female presenting to clinic today for follow up of metastatic malignant melanoma, BRAF V600 negative. She was last seen by me on 11/05/22.  Today, she states that she is doing well overall. Her appetite level is at 100%. Her energy level is at 100%.  She denies any new pain or issues within the last 6  weeks. She notes a normal appetite and has gained 1 pound since our last visit. She c/o acid reflux, triggered by eating too much. She is getting a new medication for acid reflux, as the medication she currently has is ineffective.   She is taking her thyroid pill as prescribed.   PAST MEDICAL HISTORY:   Past Medical History: Past Medical History:  Diagnosis Date   Arthritis    Hypertension    Malignant melanoma of lower leg, right (HCC) 03/27/2021   Port-A-Cath in place 04/30/2021    Surgical History: Past Surgical History:  Procedure Laterality Date   CATARACT EXTRACTION W/PHACO Right 10/03/2014   Procedure: CATARACT EXTRACTION PHACO AND INTRAOCULAR LENS PLACEMENT (IOC);  Surgeon: Jethro Bolus, MD;  Location: [facility];  Service: Ophthalmology;  Laterality: Right;  CDE:10.09   CATARACT EXTRACTION W/PHACO Left 10/10/2014   Procedure: CATARACT EXTRACTION PHACO AND INTRAOCULAR LENS PLACEMENT (IOC);  Surgeon: Jethro Bolus, MD;  Location: [facility]  ORS;  Service: Ophthalmology;  Laterality: Left;  CDE:8.69   CORNEAL 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   CORNEAL TRANSPLANT Left 2020   DENTAL RESTORATION/EXTRACTION WITH X-RAY     IR IMAGING GUIDED PORT INSERTION  04/05/2021   LEFT HEART CATH AND CORONARY ANGIOGRAPHY N/A 05/28/2021   Procedure: LEFT HEART CATH AND CORONARY ANGIOGRAPHY;  Surgeon: [name], MD;  Location: [facility] IN[facility]AS[facility];  Service: Cardiovascular;  Laterality: N/A;   MELANOMA EXCISION Right 02/12/2021   Procedure: WIDE LOCAL EXCISION RIGHT LOWER LEG MELANOMA , ADVANCEMENT FLAP CLOSURE DEFECT;  Surgeon: Almond Lint, MD;  Location: [facility] OR;  Service: General;  Laterality: Right;   SENTINEL NODE BIOPSY Right 02/12/2021   Procedure: SENTINEL NODE BIOPSY;  Surgeon: Almond Lint, MD;  Location: [facility] OR;  Service: General;  Laterality: Right;    Social History: Social History   Socioeconomic History   Marital status: Widowed    Spouse name: Not on file   Number of children: 3   Years  of education: Not on file   Highest education level: Not on file  Occupational History   Not on file  Tobacco Use   Smoking status: Never   Smokeless tobacco: Never  Vaping Use   Vaping status: Never Used  Substance and Sexual Activity   Alcohol use: No   Drug use: No   Sexual activity: Not on file  Other Topics Concern   Not on file  Social History Narrative   Not on file   Social Determinants of Health   Financial Resource Strain: Not on file  Food Insecurity: Not on file  Transportation Needs: Not on file  Physical Activity: Not on file  Stress: Not on file  Social Connections: Not on file  Intimate Partner Violence: Not on file    Family History: Family History  Problem Relation Age of Onset   Cancer Father    Hypertension Sister    Hypertension Sister     Current Medications:  Current Outpatient Medications:    alendronate (FOSAMAX) 70 MG tablet, Take 70 mg by mouth once a week., Disp: , Rfl: 0   ALPRAZolam (XANAX) 0.5 MG tablet, Take 0.5 mg by mouth 3 (three) times daily., Disp: , Rfl: 2   Aspirin Buf,CaCarb-MgCarb-MgO, (BUFFERIN PO), Take 400 mg by mouth daily at 2 PM., Disp: , Rfl:    calcium carbonate (OS-CAL - DOSED IN MG OF ELEMENTAL CALCIUM) 1250 (500 CA) MG tablet, Take 1 tablet by mouth 3 (three) times a week., Disp: , Rfl:    cholecalciferol (VITAMIN D) 1000 UNITS tablet, Take 1,000 Units by mouth 3 (three) times a week., Disp: , Rfl:    Cyanocobalamin (VITAMIN B-12 PO), Take 1 tablet by mouth 3 (three) times a week., Disp: , Rfl:    famotidine (PEPCID) 20 MG tablet, Take 20 mg by mouth daily., Disp: , Rfl:    metoprolol tartrate (LOPRESSOR) 25 MG tablet, Take 25 mg by mouth 2 (two) times daily., Disp: , Rfl:    omeprazole (PRILOSEC) 40 MG capsule, Take 40 mg by mouth daily., Disp: , Rfl:    prednisoLONE acetate (PRED FORTE) 1 % ophthalmic suspension, Place 1 drop into both eyes See admin instructions. Instill one drop into the right eye twice daily and  one drop into the left eye once daily., Disp: , Rfl:    levothyroxine (SYNTHROID) 137 MCG tablet, Take 1 tablet (137 mcg total) by mouth daily before breakfast., Disp: 30 tablet, Rfl: 5 No current facility-administered medications for  this visit.  Facility-Administered Medications Ordered in Other Visits:    sodium chloride flush (NS) 0.9 % injection 10 mL, 10 mL, Intracatheter, PRN, [name], MD, 10 mL at 12/17/22 1246   Allergies: Allergies  Allergen Reactions   Tape     Pulls skin, please use paper tape    REVIEW OF SYSTEMS:   Review of Systems  Constitutional:  Negative for chills, fatigue and fever.  HENT:   Negative for lump/mass, mouth sores, nosebleeds, sore throat and trouble swallowing.   Eyes:  Negative for eye problems.  Respiratory:  Negative for cough and shortness of breath.   Cardiovascular:  Negative for chest pain, leg swelling and palpitations.  Gastrointestinal:  Positive for constipation (occasional). Negative for abdominal pain, diarrhea, nausea and vomiting.  Genitourinary:  Negative for bladder incontinence, difficulty urinating, dysuria, frequency, hematuria and nocturia.   Musculoskeletal:  Negative for arthralgias, back pain, flank pain, myalgias and neck pain.  Skin:  Negative for itching and rash.  Neurological:  Negative for dizziness, headaches and numbness.  Hematological:  Does not bruise/bleed easily.  Psychiatric/Behavioral:  Negative for depression, sleep disturbance and suicidal ideas. The patient is not nervous/anxious.   All other systems reviewed and are negative.    VITALS:   There were no vitals taken for this visit.  Wt Readings from Last 3 Encounters:  12/17/22 174 lb 12.8 oz (79.3 kg)  11/05/22 174 lb 6.4 oz (79.1 kg)  10/15/22 173 lb 9.6 oz (78.7 kg)    There is no height or weight on file to calculate BMI.  Performance status (ECOG): 1 - Symptomatic but completely ambulatory  PHYSICAL EXAM:   Physical  Exam Vitals and nursing note reviewed. Exam conducted with a chaperone present.  Constitutional:      Appearance: Normal appearance.  Cardiovascular:     Rate and Rhythm: Normal rate and regular rhythm.     Pulses: Normal pulses.     Heart sounds: Normal heart sounds.  Pulmonary:     Effort: Pulmonary effort is normal.     Breath sounds: Normal breath sounds.  Abdominal:     Palpations: Abdomen is soft. There is no hepatomegaly, splenomegaly or mass.     Tenderness: There is no abdominal tenderness.  Musculoskeletal:     Right lower leg: No edema.     Left lower leg: No edema.  Lymphadenopathy:     Cervical: No cervical adenopathy.     Right cervical: No superficial, deep or posterior cervical adenopathy.    Left cervical: No superficial, deep or posterior cervical adenopathy.     Upper Body:     Right upper body: No supraclavicular or axillary adenopathy.     Left upper body: No supraclavicular or axillary adenopathy.  Neurological:     General: No focal deficit present.     Mental Status: She is alert and oriented to person, place, and time.  Psychiatric:        Mood and Affect: Mood normal.        Behavior: Behavior normal.     LABS:      Latest Ref Rng & Units 12/17/2022   10:02 AM 11/26/2022   12:17 PM 11/05/2022   10:53 AM  CBC  WBC 4.0 - 10.5 K/uL 5.5  5.6  6.0   Hemoglobin 12.0 - 15.0 g/dL 16.1  09.6  04.5   Hematocrit 36.0 - 46.0 % 40.3  39.4  40.3   Platelets 150 - 400 K/uL 172  165  152       Latest Ref Rng & Units 12/17/2022   10:01 AM 11/26/2022   12:17 PM 11/05/2022   10:53 AM  CMP  Glucose 70 - 99 mg/dL 409  811  914   BUN 8 - 23 mg/dL 16  13  23    Creatinine 0.44 - 1.00 mg/dL 7.82  9.56  2.13   Sodium 135 - 145 mmol/L 134  133  135   Potassium 3.5 - 5.1 mmol/L 4.3  4.1  4.2   Chloride 98 - 111 mmol/L 98  100  99   CO2 22 - 32 mmol/L 28  28  28    Calcium 8.9 - 10.3 mg/dL 9.2  9.1  9.2   Total Protein 6.5 - 8.1 g/dL 7.0  6.9  7.1   Total Bilirubin  0.3 - 1.2 mg/dL 0.7  0.6  0.6   Alkaline Phos 38 - 126 U/L 103  79  86   AST 15 - 41 U/L 22  28  21    ALT 0 - 44 U/L 15  15  13       No results found for: "CEA1", "CEA" / No results found for: "CEA1", "CEA" No results found for: "PSA1" No results found for: "YQM578" No results found for: "CAN125"  No results found for: "TOTALPROTELP", "ALBUMINELP", "A1GS", "A2GS", "BETS", "BETA2SER", "GAMS", "MSPIKE", "SPEI" No results found for: "TIBC", "FERRITIN", "IRONPCTSAT" Lab Results  Component Value Date   LDH 137 06/05/2022   LDH 144 05/13/2022   LDH 168 11/05/2021     STUDIES:   No results found.

## 2022-12-17 ENCOUNTER — Inpatient Hospital Stay: Payer: Medicare Other | Attending: Hematology

## 2022-12-17 ENCOUNTER — Inpatient Hospital Stay: Payer: Medicare Other

## 2022-12-17 ENCOUNTER — Inpatient Hospital Stay: Payer: Medicare Other | Admitting: Hematology

## 2022-12-17 VITALS — BP 131/78 | HR 56 | Temp 97.1°F | Resp 18

## 2022-12-17 DIAGNOSIS — Z800 Family history of malignant neoplasm of digestive organs: Secondary | ICD-10-CM | POA: Insufficient documentation

## 2022-12-17 DIAGNOSIS — E039 Hypothyroidism, unspecified: Secondary | ICD-10-CM | POA: Diagnosis not present

## 2022-12-17 DIAGNOSIS — I514 Myocarditis, unspecified: Secondary | ICD-10-CM

## 2022-12-17 DIAGNOSIS — Z8679 Personal history of other diseases of the circulatory system: Secondary | ICD-10-CM | POA: Insufficient documentation

## 2022-12-17 DIAGNOSIS — C7951 Secondary malignant neoplasm of bone: Secondary | ICD-10-CM | POA: Diagnosis not present

## 2022-12-17 DIAGNOSIS — C4371 Malignant melanoma of right lower limb, including hip: Secondary | ICD-10-CM

## 2022-12-17 DIAGNOSIS — M79651 Pain in right thigh: Secondary | ICD-10-CM | POA: Insufficient documentation

## 2022-12-17 DIAGNOSIS — Z95828 Presence of other vascular implants and grafts: Secondary | ICD-10-CM

## 2022-12-17 DIAGNOSIS — Z803 Family history of malignant neoplasm of breast: Secondary | ICD-10-CM | POA: Diagnosis not present

## 2022-12-17 DIAGNOSIS — Z79620 Long term (current) use of immunosuppressive biologic: Secondary | ICD-10-CM | POA: Insufficient documentation

## 2022-12-17 DIAGNOSIS — Z79890 Hormone replacement therapy (postmenopausal): Secondary | ICD-10-CM | POA: Diagnosis not present

## 2022-12-17 DIAGNOSIS — Z5112 Encounter for antineoplastic immunotherapy: Secondary | ICD-10-CM | POA: Insufficient documentation

## 2022-12-17 LAB — CBC WITH DIFFERENTIAL/PLATELET
Abs Immature Granulocytes: 0.02 10*3/uL (ref 0.00–0.07)
Basophils Absolute: 0.1 10*3/uL (ref 0.0–0.1)
Basophils Relative: 1 %
Eosinophils Absolute: 0.1 10*3/uL (ref 0.0–0.5)
Eosinophils Relative: 2 %
HCT: 40.3 % (ref 36.0–46.0)
Hemoglobin: 13.2 g/dL (ref 12.0–15.0)
Immature Granulocytes: 0 %
Lymphocytes Relative: 24 %
Lymphs Abs: 1.3 10*3/uL (ref 0.7–4.0)
MCH: 29.3 pg (ref 26.0–34.0)
MCHC: 32.8 g/dL (ref 30.0–36.0)
MCV: 89.6 fL (ref 80.0–100.0)
Monocytes Absolute: 0.4 10*3/uL (ref 0.1–1.0)
Monocytes Relative: 8 %
Neutro Abs: 3.6 10*3/uL (ref 1.7–7.7)
Neutrophils Relative %: 65 %
Platelets: 172 10*3/uL (ref 150–400)
RBC: 4.5 MIL/uL (ref 3.87–5.11)
RDW: 12.8 % (ref 11.5–15.5)
WBC: 5.5 10*3/uL (ref 4.0–10.5)
nRBC: 0 % (ref 0.0–0.2)

## 2022-12-17 LAB — COMPREHENSIVE METABOLIC PANEL
ALT: 15 U/L (ref 0–44)
AST: 22 U/L (ref 15–41)
Albumin: 3.8 g/dL (ref 3.5–5.0)
Alkaline Phosphatase: 103 U/L (ref 38–126)
Anion gap: 8 (ref 5–15)
BUN: 16 mg/dL (ref 8–23)
CO2: 28 mmol/L (ref 22–32)
Calcium: 9.2 mg/dL (ref 8.9–10.3)
Chloride: 98 mmol/L (ref 98–111)
Creatinine, Ser: 0.94 mg/dL (ref 0.44–1.00)
GFR, Estimated: >60 mL/min (ref >60)
Glucose, Bld: 105 mg/dL — ABNORMAL HIGH (ref 70–99)
Potassium: 4.3 mmol/L (ref 3.5–5.1)
Sodium: 134 mmol/L — ABNORMAL LOW (ref 135–145)
Total Bilirubin: 0.7 mg/dL (ref 0.3–1.2)
Total Protein: 7 g/dL (ref 6.5–8.1)

## 2022-12-17 LAB — TROPONIN I (HIGH SENSITIVITY): Troponin I (High Sensitivity): 8 ng/L (ref <18)

## 2022-12-17 LAB — TSH: TSH: 13.883 u[IU]/mL — ABNORMAL HIGH (ref 0.350–4.500)

## 2022-12-17 LAB — MAGNESIUM: Magnesium: 2 mg/dL (ref 1.7–2.4)

## 2022-12-17 MED ORDER — SODIUM CHLORIDE 0.9 % IV SOLN: Freq: Once | INTRAVENOUS | Status: AC

## 2022-12-17 MED ORDER — SODIUM CHLORIDE 0.9% FLUSH
10.0000 mL | Freq: Once | INTRAVENOUS | Status: AC
  Administered 2022-12-17: 10 mL via INTRAVENOUS

## 2022-12-17 MED ORDER — SODIUM CHLORIDE 0.9 % IV SOLN
200.0000 mg | Freq: Once | INTRAVENOUS | Status: AC
  Administered 2022-12-17: 200 mg via INTRAVENOUS
  Filled 2022-12-17: qty 8

## 2022-12-17 MED ORDER — SODIUM CHLORIDE 0.9% FLUSH
10.0000 mL | INTRAVENOUS | Status: DC | PRN
  Administered 2022-12-17: 10 mL

## 2022-12-17 MED ORDER — HEPARIN SOD (PORK) LOCK FLUSH 100 UNIT/ML IV SOLN
500.0000 [IU] | Freq: Once | INTRAVENOUS | Status: AC | PRN
  Administered 2022-12-17: 500 [IU]

## 2022-12-17 MED ORDER — LEVOTHYROXINE SODIUM 137 MCG PO TABS: 137.0000 ug | ORAL_TABLET | Freq: Every day | ORAL | 5 refills | Status: DC

## 2022-12-17 NOTE — Patient Instructions (Signed)
[facility]  Discharge Instructions: Thank you for choosing [facility] to provide your oncology and hematology care.  If you have a lab appointment with the Cancer Center - please note that after April 8th, 2024, all labs will be drawn in the cancer center.  You do not have to check in or register with the main entrance as you have in the past but will complete your check-in in the cancer center.  Wear comfortable clothing and clothing appropriate for easy access to any Portacath or PICC line.   We strive to give you quality time with your provider. You may need to reschedule your appointment if you arrive late (15 or more minutes).  Arriving late affects you and other patients whose appointments are after yours.  Also, if you miss three or more appointments without notifying the office, you may be dismissed from the clinic at the provider's discretion.      For prescription refill requests, have your pharmacy contact our office and allow 72 hours for refills to be completed.    Today you received the following chemotherapy and/or immunotherapy agents keytruda   To help prevent nausea and vomiting after your treatment, we encourage you to take your nausea medication as directed.  BELOW ARE SYMPTOMS THAT SHOULD BE REPORTED IMMEDIATELY: *FEVER GREATER THAN 100.4 F (38 C) OR HIGHER *CHILLS OR SWEATING *NAUSEA AND VOMITING THAT IS NOT CONTROLLED WITH YOUR NAUSEA MEDICATION *UNUSUAL SHORTNESS OF BREATH *UNUSUAL BRUISING OR BLEEDING *URINARY PROBLEMS (pain or burning when urinating, or frequent urination) *BOWEL PROBLEMS (unusual diarrhea, constipation, pain near the anus) TENDERNESS IN MOUTH AND THROAT WITH OR WITHOUT PRESENCE OF ULCERS (sore throat, sores in mouth, or a toothache) UNUSUAL RASH, SWELLING OR PAIN  UNUSUAL VAGINAL DISCHARGE OR ITCHING   Items with * indicate a potential emergency and should be followed up as soon as possible or go to the  Emergency Department if any problems should occur.  Please show the CHEMOTHERAPY ALERT CARD or IMMUNOTHERAPY ALERT CARD at check-in to the Emergency Department and triage nurse.  Should you have questions after your visit or need to cancel or reschedule your appointment, please contact [facility] [phone]  and follow the prompts.  Office hours are 8:00 a.m. to 4:30 p.m. Monday - Friday. Please note that voicemails left after 4:00 p.m. may not be returned until the following business day.  We are closed weekends and major holidays. You have access to a nurse at all times for urgent questions. Please call the main number to the clinic [phone] and follow the prompts.  For any non-urgent questions, you may also contact your provider using MyChart. We now offer e-Visits for anyone 18 and older to request care online for non-urgent symptoms. For details visit [email or web address].   Also download the MyChart app! Go to the app store, search "MyChart", open the app, select [facility], and log in with your MyChart username and password.   

## 2022-12-17 NOTE — Patient Instructions (Addendum)
[facility]  Discharge Instructions  You were seen and examined today by Dr. Ellin Saba.  Dr. Ellin Saba discussed your most recent lab work which is stable. Your TSH remains high, we will increase your synthroid. Remember to always take it on an empty stomach.  Continue with treatment as scheduled today. You will get a PET scan prior to next treatment.  Follow-up as scheduled.  Thank you for choosing [facility] to provide your oncology and hematology care.   To afford each patient quality time with our provider, please arrive at least 15 minutes before your scheduled appointment time. You may need to reschedule your appointment if you arrive late (10 or more minutes). Arriving late affects you and other patients whose appointments are after yours.  Also, if you miss three or more appointments without notifying the office, you may be dismissed from the clinic at the provider's discretion.    Again, thank you for choosing [facility].  Our hope is that these requests will decrease the amount of time that you wait before being seen by our physicians.   If you have a lab appointment with the Cancer Center - please note that after April 8th, all labs will be drawn in the cancer center.  You do not have to check in or register with the main entrance as you have in the past but will complete your check-in at the cancer center.            _____________________________________________________________  Should you have questions after your visit to [facility], please contact our office at [phone] and follow the prompts.  Our office hours are 8:00 a.m. to 4:30 p.m. Monday - Thursday and 8:00 a.m. to 2:30 p.m. Friday.  Please note that voicemails left after 4:00 p.m. may not be returned until the following business day.  We are closed weekends and all major holidays.  You do have access to a nurse 24-7, just call the  main number to the clinic [phone] and do not press any options, hold on the line and a nurse will answer the phone.    For prescription refill requests, have your pharmacy contact our office and allow 72 hours.    Masks are no longer required in the cancer centers. If you would like for your care team to wear a mask while they are taking care of you, please let them know. You may have one support person who is at least 82 years old accompany you for your appointments.

## 2022-12-17 NOTE — Progress Notes (Signed)
Patient has been assessed, vital signs and labs have been reviewed by Dr. Katragadda. ANC, Creatinine, LFTs, and Platelets are within treatment parameters per Dr. Katragadda. The patient is good to proceed with treatment at this time. Primary RN and pharmacy aware.  

## 2022-12-17 NOTE — Progress Notes (Signed)
Patient tolerated chemotherapy with no complaints voiced.  Side effects with management reviewed with understanding verbalized.  Port site clean and dry with no bruising or swelling noted at site.  Good blood return noted before and after administration of chemotherapy.  Band aid applied.  Patient left in satisfactory condition with VSS and no s/s of distress noted.   

## 2022-12-17 NOTE — Progress Notes (Signed)
[name] presented for Portacath access and flush.  Portacath located right chest wall accessed with  H 20 needle.  Good blood return present. Portacath flushed with 20ml NS and 500U/50ml Heparin and needle removed intact.  Procedure tolerated well and without incident.

## 2023-01-01 ENCOUNTER — Encounter (HOSPITAL_COMMUNITY)
Admission: RE | Admit: 2023-01-01 | Discharge: 2023-01-01 | Disposition: A | Discharge status: Home / Self Care | Payer: Medicare Other | Source: Ambulatory Visit | Attending: Hematology | Admitting: Hematology

## 2023-01-01 DIAGNOSIS — C770 Secondary and unspecified malignant neoplasm of lymph nodes of head, face and neck: Secondary | ICD-10-CM | POA: Diagnosis not present

## 2023-01-01 DIAGNOSIS — C7951 Secondary malignant neoplasm of bone: Secondary | ICD-10-CM | POA: Diagnosis not present

## 2023-01-01 DIAGNOSIS — C4371 Malignant melanoma of right lower limb, including hip: Secondary | ICD-10-CM | POA: Diagnosis not present

## 2023-01-01 MED ORDER — FLUDEOXYGLUCOSE F - 18 (FDG) INJECTION
9.1900 | Freq: Once | INTRAVENOUS | Status: AC | PRN
  Administered 2023-01-01: 9.19 via INTRAVENOUS

## 2023-01-05 DIAGNOSIS — E7849 Other hyperlipidemia: Secondary | ICD-10-CM | POA: Diagnosis not present

## 2023-01-05 DIAGNOSIS — I10 Essential (primary) hypertension: Secondary | ICD-10-CM | POA: Diagnosis not present

## 2023-01-05 DIAGNOSIS — Z0000 Encounter for general adult medical examination without abnormal findings: Secondary | ICD-10-CM | POA: Diagnosis not present

## 2023-01-05 DIAGNOSIS — M810 Age-related osteoporosis without current pathological fracture: Secondary | ICD-10-CM | POA: Diagnosis not present

## 2023-01-05 DIAGNOSIS — C439 Malignant melanoma of skin, unspecified: Secondary | ICD-10-CM | POA: Diagnosis not present

## 2023-01-07 ENCOUNTER — Inpatient Hospital Stay (HOSPITAL_BASED_OUTPATIENT_CLINIC_OR_DEPARTMENT_OTHER): Payer: Medicare Other | Admitting: Hematology

## 2023-01-07 ENCOUNTER — Inpatient Hospital Stay: Payer: Medicare Other

## 2023-01-07 VITALS — BP 125/76 | HR 68 | Temp 97.9°F | Resp 16 | Wt 172.0 lb

## 2023-01-07 DIAGNOSIS — E039 Hypothyroidism, unspecified: Secondary | ICD-10-CM | POA: Diagnosis not present

## 2023-01-07 DIAGNOSIS — Z95828 Presence of other vascular implants and grafts: Secondary | ICD-10-CM

## 2023-01-07 DIAGNOSIS — Z8679 Personal history of other diseases of the circulatory system: Secondary | ICD-10-CM | POA: Diagnosis not present

## 2023-01-07 DIAGNOSIS — C4371 Malignant melanoma of right lower limb, including hip: Secondary | ICD-10-CM | POA: Diagnosis not present

## 2023-01-07 DIAGNOSIS — M79651 Pain in right thigh: Secondary | ICD-10-CM | POA: Diagnosis not present

## 2023-01-07 DIAGNOSIS — Z5112 Encounter for antineoplastic immunotherapy: Secondary | ICD-10-CM | POA: Diagnosis not present

## 2023-01-07 DIAGNOSIS — Z800 Family history of malignant neoplasm of digestive organs: Secondary | ICD-10-CM | POA: Diagnosis not present

## 2023-01-07 DIAGNOSIS — C7951 Secondary malignant neoplasm of bone: Secondary | ICD-10-CM | POA: Diagnosis not present

## 2023-01-07 DIAGNOSIS — Z79620 Long term (current) use of immunosuppressive biologic: Secondary | ICD-10-CM | POA: Diagnosis not present

## 2023-01-07 DIAGNOSIS — I514 Myocarditis, unspecified: Secondary | ICD-10-CM

## 2023-01-07 DIAGNOSIS — Z803 Family history of malignant neoplasm of breast: Secondary | ICD-10-CM | POA: Diagnosis not present

## 2023-01-07 LAB — MAGNESIUM: Magnesium: 2.2 mg/dL (ref 1.7–2.4)

## 2023-01-07 LAB — COMPREHENSIVE METABOLIC PANEL
ALT: 13 U/L (ref 0–44)
AST: 24 U/L (ref 15–41)
Albumin: 3.7 g/dL (ref 3.5–5.0)
Alkaline Phosphatase: 84 U/L (ref 38–126)
Anion gap: 7 (ref 5–15)
BUN: 15 mg/dL (ref 8–23)
CO2: 28 mmol/L (ref 22–32)
Calcium: 8.7 mg/dL — ABNORMAL LOW (ref 8.9–10.3)
Chloride: 99 mmol/L (ref 98–111)
Creatinine, Ser: 0.92 mg/dL (ref 0.44–1.00)
GFR, Estimated: >60 mL/min (ref >60)
Glucose, Bld: 100 mg/dL — ABNORMAL HIGH (ref 70–99)
Potassium: 3.8 mmol/L (ref 3.5–5.1)
Sodium: 134 mmol/L — ABNORMAL LOW (ref 135–145)
Total Bilirubin: 0.8 mg/dL (ref 0.3–1.2)
Total Protein: 6.7 g/dL (ref 6.5–8.1)

## 2023-01-07 LAB — CBC WITH DIFFERENTIAL/PLATELET
Abs Immature Granulocytes: 0.01 10*3/uL (ref 0.00–0.07)
Basophils Absolute: 0 10*3/uL (ref 0.0–0.1)
Basophils Relative: 1 %
Eosinophils Absolute: 0.2 10*3/uL (ref 0.0–0.5)
Eosinophils Relative: 4 %
HCT: 39.1 % (ref 36.0–46.0)
Hemoglobin: 12.8 g/dL (ref 12.0–15.0)
Immature Granulocytes: 0 %
Lymphocytes Relative: 27 %
Lymphs Abs: 1.1 10*3/uL (ref 0.7–4.0)
MCH: 29.6 pg (ref 26.0–34.0)
MCHC: 32.7 g/dL (ref 30.0–36.0)
MCV: 90.5 fL (ref 80.0–100.0)
Monocytes Absolute: 0.4 10*3/uL (ref 0.1–1.0)
Monocytes Relative: 11 %
Neutro Abs: 2.3 10*3/uL (ref 1.7–7.7)
Neutrophils Relative %: 57 %
Platelets: 142 10*3/uL — ABNORMAL LOW (ref 150–400)
RBC: 4.32 MIL/uL (ref 3.87–5.11)
RDW: 13.4 % (ref 11.5–15.5)
WBC: 4.1 10*3/uL (ref 4.0–10.5)
nRBC: 0 % (ref 0.0–0.2)

## 2023-01-07 LAB — TSH: TSH: 0.944 u[IU]/mL (ref 0.350–4.500)

## 2023-01-07 LAB — TROPONIN I (HIGH SENSITIVITY): Troponin I (High Sensitivity): 7 ng/L (ref <18)

## 2023-01-07 MED ORDER — SODIUM CHLORIDE 0.9% FLUSH
10.0000 mL | INTRAVENOUS | Status: DC | PRN
  Administered 2023-01-07: 10 mL via INTRAVENOUS

## 2023-01-07 MED ORDER — HEPARIN SOD (PORK) LOCK FLUSH 100 UNIT/ML IV SOLN
500.0000 [IU] | Freq: Once | INTRAVENOUS | Status: AC
  Administered 2023-01-07: 500 [IU] via INTRAVENOUS

## 2023-01-07 NOTE — Patient Instructions (Addendum)
[facility] Discharge Instructions   You were seen and examined today by Dr. Ellin Saba.  He reviewed the results of your lab work which are normal/stable.   He reviewed the results of your PET scan. It is showing new areas of cancer in the lymph nodes in the pelvis.  Dr. Kirtland Bouchard recommends switching treatment. He discussed with you treatment with continuing Keytruda and adding another immunotherapy drug called Yervoy.   Return as scheduled.    Thank you for choosing [facility] to provide your oncology and hematology care.  To afford each patient quality time with our provider, please arrive at least 15 minutes before your scheduled appointment time.   If you have a lab appointment with the Cancer Center please come in thru the Main Entrance and check in at the main information desk.  You need to re-schedule your appointment should you arrive 10 or more minutes late.  We strive to give you quality time with our providers, and arriving late affects you and other patients whose appointments are after yours.  Also, if you no show three or more times for appointments you may be dismissed from the clinic at the providers discretion.     Again, thank you for choosing [facility].  Our hope is that these requests will decrease the amount of time that you wait before being seen by our physicians.       _____________________________________________________________  Should you have questions after your visit to [facility], please contact our office at [phone] and follow the prompts.  Our office hours are 8:00 a.m. and 4:30 p.m. Monday - Friday.  Please note that voicemails left after 4:00 p.m. may not be returned until the following business day.  We are closed weekends and major holidays.  You do have access to a nurse 24-7, just call the main number to the clinic [phone] and do not press any  options, hold on the line and a nurse will answer the phone.    For prescription refill requests, have your pharmacy contact our office and allow 72 hours.    Due to Covid, you will need to wear a mask upon entering the hospital. If you do not have a mask, a mask will be given to you at the Main Entrance upon arrival. For doctor visits, patients may have 1 support person age 2 or older with them. For treatment visits, patients can not have anyone with them due to social distancing guidelines and our immunocompromised population.

## 2023-01-07 NOTE — Progress Notes (Signed)
[facility] labs drawn and port deaccessed.  Patient will not get treatment today.

## 2023-01-07 NOTE — Progress Notes (Signed)
Treatment rescheduled today.  Patient showed progression on recent PET scan.  MD wanted to add Yervoy, but drug was unavailable for today.

## 2023-01-07 NOTE — Progress Notes (Signed)
[facility] [address], [place] [address]    Clinic Day:  01/07/23   Referring physician: [name], MD  Patient Care Team: [name], MD as PCP - General (Internal Medicine) [name], MD as Medical Oncologist (Medical Oncology) [name], RN as Oncology Nurse Navigator (Oncology)   ASSESSMENT & PLAN:   Assessment: 1. Malignant melanoma of right posterior leg (pT4 pN3 M1): - She noticed fleshy lesion 4 months ago on the right leg. - Her PMD did a biopsy which was consistent with melanoma. - Wide local excision and lymph node biopsy by Dr. Donell Beers on 02/12/2021. - Pathology margins free, nodular type, Breslow's thickness 9.52 mm, Clark level V, deep margins free, ulceration absent, satellitosis absent, mitotic index 2/millimeters squared, LVI negative.  Neurotropism absent.  Tumor infiltrating lymphocytes absent.  Tumor regression not noted.  3 out of 3 positive sentinel lymph nodes for melanoma. - PET CT scan on 03/22/2021 with 3 foci of intense radiotracer activity within the right lower extremity.  In the medial right upper thigh nodule just superficial to thigh musculature measuring 11 mm with SUV 7.6.  Intramedullary hypermetabolic activity within the shaft of the mid right femur with SUV 14.6.  Intense activity associated with condylar notch of the distal right femur, appears to localize to soft tissue rather than the bone.  More diffuse activity associated with the medial right calf related to excision biopsy.  There is a focus of activity adjacent to the lateral margin of the right iliac wing SUV 4.1.  No evidence of visceral metastatic disease in the chest, abdomen or pelvis. 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 - NGS testing-BRAF negative, positive for NRAS  pathogenic variant exon 3, TERT promoter.  MSI-stable.  MMR-proficient.  NTRK 1/2/3 fusion not detected.  KIT mutation negative.  PD-L1 negative. - Nivolumab-Relatlimab every 28 days started on 05/09/2021.  Held permanently after first dose due to myocarditis. - PET scan on 07/25/2021: Slight progression with no new sites of metastatic disease. - Pembrolizumab started on 09/03/2021.  XRT to the soft tissue nodule within the medial right thigh and midshaft right femoral lesion from 03/26/2022 through 04/09/2022.   2. Social/family history: - She is seen with her son today.  She lives by herself at home.  She is independent of ADLs and IADLs.  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  She is non-smoker. - Father had colon cancer.  Sister had breast cancer and colon cancer.  Maternal aunt had breast cancer.  Paternal uncle had colon cancer.    Plan: 1. Malignant melanoma of the right posterior leg (pT4 pN3 M1), BRAF V600 negative: - She does not report any immunotherapy related side effects. - Labs today: Normal LFTs and creatinine.  CBC grossly normal. - PET scan from 01/01/2023: Right femoral osseous metastasis and metastatic lesion/noted in the medial right thigh grossly unchanged.  New 2.9 cm right deep inguinal lymph node, 2 cm right inguinal lymph node and 1.4 cm right external iliac lymph node with SUV ranging from 8-11. - We have discussed further options including adding a low-dose ipilimumab to pembrolizumab which she is already receiving without any side effects.  I have quoted response rates around 30% (J Clin Oncol. 2021 Aug 20;39(24):2647-2655. doi: 10.1200/JCO.21.00079).  We also discussed the possibility increased chance of immunotherapy related side effects with combination of PD-1 and anti-CTL A4 antibody. - We also discussed option of binimetinib as she has NRAS mutation on previous NGS testing.  However this option is also associated with  significant side effects and overall response rate of around 20%. - We also discussed other options including adding lenvatinib to pembrolizumab, least favorable option due to lack of robust data.  We have discussed chemotherapy options with dacarbazine/temozolomide among others. - We will send [employer] comprehensive panel testing to see any other targetable mutations. - We will try to obtain authorization for pembrolizumab and low-dose ipilimumab regimen per patient wishes. - Will proceed with treatment and see her back prior to cycle 2.  2.  History of immunotherapy myocarditis: - She does not report any chest pains or left arm pain.  Troponin levels are normal today at 7.  3.  Right medial thigh pain: - Occasional burning sensation in the medial right thigh is stable.  No new pains.  4.  Hypothyroidism: - Continue Synthroid 137 mcg daily.  TSH today 0.9, improved from 13.8 at last visit.    No orders of the defined types were placed in this encounter.     [name],acting as a Neurosurgeon for [name], MD.,have documented all relevant documentation on the behalf of [name], MD,as directed by  [name], MD while in the presence of [name], MD.  I, [name] MD, have reviewed the above documentation for accuracy and completeness, and I agree with the above.    [name], MD   8/28/20249:08 AM  CHIEF COMPLAINT:   Diagnosis: metastatic malignant melanoma, BRAF V600 negative    Cancer Staging  Malignant melanoma of lower leg, right (HCC) Staging form: Melanoma of the Skin, AJCC 8th Edition - Clinical stage from 03/27/2021: Stage IV (cT4a, cN3, cM1) - Unsigned    Prior Therapy: none  Current Therapy:  Pembrolizumab (200) q21d Keytruda    HISTORY OF PRESENT ILLNESS:   Oncology History  Malignant melanoma of lower leg, right (HCC)  03/27/2021 Initial Diagnosis   Malignant melanoma of lower leg, right  (HCC)   05/09/2021 - 05/09/2021 Chemotherapy   Patient is on Treatment Plan : MELANOMA - Opdualag (nivolumab/relatlimab) q28d     09/03/2021 - 01/07/2022 Chemotherapy   Patient is on Treatment Plan : MELANOMA Pembrolizumab (200) q21d     09/03/2021 -  Chemotherapy   Patient is on Treatment Plan : MELANOMA Pembrolizumab (200) q21d        INTERVAL HISTORY:   Elizabeth Norman is a 82 y.o. female seen for follow-up of melanoma.  Denies any diarrhea, skin rashes, severe fatigue.  Appetite is 100%.  Energy levels are 75%.  Dyspnea on exertion is stable.  Mild constipation is also stable.  No new pains reported.  PAST MEDICAL HISTORY:   Past Medical History: Past Medical History:  Diagnosis Date   Arthritis    Hypertension    Malignant melanoma of lower leg, right (HCC) 03/27/2021   Port-A-Cath in place 04/30/2021    Surgical History: Past Surgical History:  Procedure Laterality Date   CATARACT EXTRACTION W/PHACO Right 10/03/2014   Procedure: CATARACT EXTRACTION PHACO AND INTRAOCULAR LENS PLACEMENT (IOC);  Surgeon: Jethro Bolus, MD;  Location: [facility]S;  Service: Ophthalmology;  Laterality: Right;  CDE:10.09   CATARACT EXTRACTION W/PHACO Left 10/10/2014   Procedure: CATARACT EXTRACTION PHACO AND INTRAOCULAR LENS PLACEMENT (IOC);  Surgeon: Jethro Bolus, MD;  Location: [facility]  ORS;  Service: Ophthalmology;  Laterality: Left;  CDE:8.69   CORNEAL 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   CORNEAL TRANSPLANT Left 2020   DENTAL RESTORATION/EXTRACTION WITH X-RAY     IR IMAGING GUIDED PORT INSERTION  04/05/2021   LEFT HEART CATH AND CORONARY ANGIOGRAPHY N/A 05/28/2021   Procedure: LEFT HEART CATH AND CORONARY ANGIOGRAPHY;  Surgeon: [name], MD;  Location: [facility] IN[facility];  Service: Cardiovascular;  Laterality: N/A;   MELANOMA EXCISION Right 02/12/2021   Procedure: WIDE LOCAL EXCISION RIGHT LOWER LEG MELANOMA , ADVANCEMENT FLAP CLOSURE DEFECT;  Surgeon: Almond Lint, MD;  Location: [facility] OR;  Service: General;   Laterality: Right;   SENTINEL NODE BIOPSY Right 02/12/2021   Procedure: SENTINEL NODE BIOPSY;  Surgeon: Almond Lint, MD;  Location: [facility] OR;  Service: General;  Laterality: Right;    Social History: Social History   Socioeconomic History   Marital status: Widowed    Spouse name: Not on file   Number of children: 3   Years of education: Not on file   Highest education level: Not on file  Occupational History   Not on file  Tobacco Use   Smoking status: Never   Smokeless tobacco: Never  Vaping Use   Vaping status: Never Used  Substance and Sexual Activity   Alcohol use: No   Drug use: No   Sexual activity: Not on file  Other Topics Concern   Not on file  Social History Narrative   Not on file   Social Determinants of Health   Financial Resource Strain: Not on file  Food Insecurity: Not on file  Transportation Needs: Not on file  Physical Activity: Not on file  Stress: Not on file  Social Connections: Not on file  Intimate Partner Violence: Not on file    Family History: Family History  Problem Relation Age of Onset   Cancer Father    Hypertension Sister    Hypertension Sister     Current Medications:  Current Outpatient Medications:    alendronate (FOSAMAX) 70 MG tablet, Take 70 mg by mouth once a week., Disp: , Rfl: 0   ALPRAZolam (XANAX) 0.5 MG tablet, Take 0.5 mg by mouth 3 (three) times daily., Disp: , Rfl: 2   Aspirin Buf,CaCarb-MgCarb-MgO, (BUFFERIN PO), Take 400 mg by mouth daily at 2 PM., Disp: , Rfl:    calcium carbonate (OS-CAL - DOSED IN MG OF ELEMENTAL CALCIUM) 1250 (500 CA) MG tablet, Take 1 tablet by mouth 3 (three) times a week., Disp: , Rfl:    cholecalciferol (VITAMIN D) 1000 UNITS tablet, Take 1,000 Units by mouth 3 (three) times a week., Disp: , Rfl:    Cyanocobalamin (VITAMIN B-12 PO), Take 1 tablet by mouth 3 (three) times a week., Disp: , Rfl:    famotidine (PEPCID) 20 MG tablet, Take 20 mg by mouth daily., Disp: , Rfl:    levothyroxine  (SYNTHROID) 137 MCG tablet, Take 1 tablet (137 mcg total) by mouth daily before breakfast., Disp: 30 tablet, Rfl: 5   metoprolol tartrate (LOPRESSOR) 25 MG tablet, Take 25 mg by mouth 2 (two) times daily., Disp: , Rfl:    omeprazole (PRILOSEC) 40 MG capsule, Take 40 mg by mouth daily., Disp: , Rfl:    prednisoLONE acetate (PRED FORTE) 1 % ophthalmic suspension, Place 1 drop into both eyes See admin instructions. Instill one drop into the right eye twice daily and one drop into the left eye once daily., Disp: , Rfl:    Allergies: Allergies  Allergen Reactions   Tape     Pulls skin, please use paper tape    REVIEW OF SYSTEMS:   Review of Systems  Constitutional:  Negative for chills, fatigue and fever.  HENT:   Negative for lump/mass, mouth sores, nosebleeds, sore throat and trouble swallowing.   Eyes:  Negative for eye problems.  Respiratory:  Positive for shortness of breath. Negative for cough.   Cardiovascular:  Negative for chest pain, leg swelling and palpitations.  Gastrointestinal:  Positive for constipation (occasional). Negative for abdominal pain, diarrhea, nausea and vomiting.  Genitourinary:  Negative for bladder incontinence, difficulty urinating, dysuria, frequency, hematuria and nocturia.   Musculoskeletal:  Negative for arthralgias, back pain, flank pain, myalgias and neck pain.  Skin:  Negative for itching and rash.  Neurological:  Positive for headaches. Negative for dizziness and numbness.  Hematological:  Does not bruise/bleed easily.  Psychiatric/Behavioral:  Negative for depression, sleep disturbance and suicidal ideas. The patient is not nervous/anxious.   All other systems reviewed and are negative.    VITALS:   Blood pressure 125/76, pulse 68, temperature 97.9 F (36.6 C), temperature source Oral, resp. rate 16, weight 171 lb 15.3 oz (78 kg), SpO2 99%.  Wt Readings from Last 3 Encounters:  01/07/23 171 lb 15.3 oz (78 kg)  12/17/22 174 lb 12.8 oz (79.3 kg)   11/05/22 174 lb 6.4 oz (79.1 kg)    Body mass index is 30.85 kg/m.  Performance status (ECOG): 1 - Symptomatic but completely ambulatory  PHYSICAL EXAM:   Physical Exam Vitals and nursing note reviewed. Exam conducted with a chaperone present.  Constitutional:      Appearance: Normal appearance.  Cardiovascular:     Rate and Rhythm: Normal rate and regular rhythm.     Pulses: Normal pulses.     Heart sounds: Normal heart sounds.  Pulmonary:     Effort: Pulmonary effort is normal.     Breath sounds: Normal breath sounds.  Abdominal:     Palpations: Abdomen is soft. There is no hepatomegaly, splenomegaly or mass.     Tenderness: There is no abdominal tenderness.  Musculoskeletal:     Right lower leg: No edema.     Left lower leg: No edema.  Lymphadenopathy:     Cervical: No cervical adenopathy.     Right cervical: No superficial, deep or posterior cervical adenopathy.    Left cervical: No superficial, deep or posterior cervical adenopathy.     Upper Body:     Right upper body: No supraclavicular or axillary adenopathy.     Left upper body: No supraclavicular or axillary adenopathy.  Neurological:     General: No focal deficit present.     Mental Status: She is alert and oriented to person, place, and time.  Psychiatric:        Mood and Affect: Mood normal.        Behavior: Behavior normal.    LABS:      Latest Ref Rng & Units 01/07/2023    7:54 AM 12/17/2022   10:02 AM 11/26/2022   12:17 PM  CBC  WBC 4.0 - 10.5 K/uL 4.1  5.5  5.6   Hemoglobin 12.0 - 15.0 g/dL 96.2  95.2  84.1   Hematocrit 36.0 - 46.0 % 39.1  40.3  39.4   Platelets 150 - 400 K/uL 142  172  165       Latest Ref Rng & Units 01/07/2023    7:54 AM 12/17/2022   10:01 AM  11/26/2022   12:17 PM  CMP  Glucose 70 - 99 mg/dL 161  096  045   BUN 8 - 23 mg/dL 15  16  13    Creatinine 0.44 - 1.00 mg/dL 4.09  8.11  9.14   Sodium 135 - 145 mmol/L 134  134  133   Potassium 3.5 - 5.1 mmol/L 3.8  4.3  4.1    Chloride 98 - 111 mmol/L 99  98  100   CO2 22 - 32 mmol/L 28  28  28    Calcium 8.9 - 10.3 mg/dL 8.7  9.2  9.1   Total Protein 6.5 - 8.1 g/dL 6.7  7.0  6.9   Total Bilirubin 0.3 - 1.2 mg/dL 0.8  0.7  0.6   Alkaline Phos 38 - 126 U/L 84  103  79   AST 15 - 41 U/L 24  22  28    ALT 0 - 44 U/L 13  15  15       No results found for: "CEA1", "CEA" / No results found for: "CEA1", "CEA" No results found for: "PSA1" No results found for: "NWG956" No results found for: "CAN125"  No results found for: "TOTALPROTELP", "ALBUMINELP", "A1GS", "A2GS", "BETS", "BETA2SER", "GAMS", "MSPIKE", "SPEI" No results found for: "TIBC", "FERRITIN", "IRONPCTSAT" Lab Results  Component Value Date   LDH 137 06/05/2022   LDH 144 05/13/2022   LDH 168 11/05/2021     STUDIES:   NM PET Image Restage (PS) Whole Body  Result Date: 01/07/2023 CLINICAL DATA:  Subsequent treatment strategy for right lower leg melanoma. EXAM: NUCLEAR MEDICINE PET WHOLE BODY TECHNIQUE: 9.2 mCi F-18 FDG was injected intravenously. Full-ring PET imaging was performed from the head to foot after the radiotracer. CT data was obtained and used for attenuation correction and anatomic localization. Fasting blood glucose: 85 mg/dl COMPARISON:  21/30/8657 FINDINGS: Mediastinal blood pool activity: SUV max 2.3 HEAD/NECK: No hypermetabolic activity in the scalp. No hypermetabolic cervical lymph nodes. Focal hypermetabolism in the deep lobe of the right parotid gland, max SUV 3.5, previously 5.2. 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 Incidental CT findings: none CHEST: No hypermetabolic mediastinal or hilar nodes. No suspicious pulmonary nodules on the CT scan. Right chest port terminates at the cavoatrial junction. Incidental CT findings: Atherosclerotic calcifications of the aortic arch. Severe coronary atherosclerosis of the LAD and left circumflex.  ABDOMEN/PELVIS: No abnormal hypermetabolic activity within the liver, pancreas, adrenal glands, or spleen. No hypermetabolic lymph nodes in the abdomen. New right pelvic nodal metastases, including: --14 mm short axis right external iliac node (series 202/image 252), max SUV 8.8 --2.9 cm short axis right deep inguinal node (series 202/image 243), max SUV 11.0 --2.0 cm short axis right inguinal node (series 202/image 267), max SUV 9.7 Adjacent surgical clips in the right groin. 7.0 x 5.9 cm fat/soft tissue presacral mass, max SUV 1.4, grossly unchanged. This appearance suggests a presacral myelolipoma. Incidental CT findings: Cholelithiasis, without associated inflammatory changes. Left colonic diverticulosis, without evidence of diverticulitis. Atherosclerotic calcifications abdominal aorta and branch vessels. SKELETON: No focal hypermetabolic activity to suggest skeletal metastasis. Incidental CT findings: Degenerative changes of the visualized thoracolumbar spine. EXTREMITIES: Postsurgical changes in the medial right calf, non FDG avid. Again seen is mild hypermetabolism along the posterior aspect of the right mid/distal femur, max SUV 3.0, previously 6.6. 11 mm soft tissue nodule along the medial right thigh (series 202/image 317), max SUV 5.4, previously 2.4. Incidental  CT findings: none IMPRESSION: Postsurgical changes in the medial right calf. Right femoral osseous metastasis and metastatic lesion/node in the medial right thigh, grossly unchanged. New right pelvic nodal metastases, as above, reflecting progression of disease. Stable presacral mass, favoring a presacral myelolipoma. Electronically Signed   By: Charline Bills M.D.   On: 01/07/2023 08:35

## 2023-01-08 LAB — T4: T4, Total: 10.7 ug/dL (ref 4.5–12.0)

## 2023-01-11 NOTE — Progress Notes (Signed)
Pharmacist Chemotherapy Monitoring - Initial Assessment    Anticipated start date: 01/14/23   The following has been reviewed per standard work regarding the patient's treatment regimen: The patient's diagnosis, treatment plan and drug doses, and organ/hematologic function Lab orders and baseline tests specific to treatment regimen  The treatment plan start date, drug sequencing, and pre-medications Prior authorization status  Patient's documented medication list, including drug-drug interaction screen and prescriptions for anti-emetics and supportive care specific to the treatment regimen The drug concentrations, fluid compatibility, administration routes, and timing of the medications to be used The patient's access for treatment and lifetime cumulative dose history, if applicable  The patient's medication allergies and previous infusion related reactions, if applicable   Changes made to treatment plan:  pre-medications pepcid 20 mg IVPB and cetirizine 5 mg IV added per Yervoy protocol.  Yervoy new start added to [facility] patient previously on.  Follow up needed:  N/A   Stephens Shire, [facility], 01/11/2023  1:24 PM

## 2023-01-13 DIAGNOSIS — C4371 Malignant melanoma of right lower limb, including hip: Secondary | ICD-10-CM | POA: Diagnosis not present

## 2023-01-14 ENCOUNTER — Inpatient Hospital Stay: Payer: Medicare Other

## 2023-01-14 ENCOUNTER — Inpatient Hospital Stay: Payer: Medicare Other | Attending: Hematology

## 2023-01-14 VITALS — BP 107/50 | HR 74 | Temp 98.0°F | Resp 16

## 2023-01-14 DIAGNOSIS — C7951 Secondary malignant neoplasm of bone: Secondary | ICD-10-CM | POA: Diagnosis not present

## 2023-01-14 DIAGNOSIS — C4371 Malignant melanoma of right lower limb, including hip: Secondary | ICD-10-CM | POA: Insufficient documentation

## 2023-01-14 DIAGNOSIS — I514 Myocarditis, unspecified: Secondary | ICD-10-CM

## 2023-01-14 DIAGNOSIS — Z5112 Encounter for antineoplastic immunotherapy: Secondary | ICD-10-CM | POA: Diagnosis not present

## 2023-01-14 DIAGNOSIS — Z95828 Presence of other vascular implants and grafts: Secondary | ICD-10-CM

## 2023-01-14 DIAGNOSIS — Z79620 Long term (current) use of immunosuppressive biologic: Secondary | ICD-10-CM | POA: Insufficient documentation

## 2023-01-14 LAB — CBC WITH DIFFERENTIAL/PLATELET
Abs Immature Granulocytes: 0.01 10*3/uL (ref 0.00–0.07)
Basophils Absolute: 0 10*3/uL (ref 0.0–0.1)
Basophils Relative: 1 %
Eosinophils Absolute: 0.2 10*3/uL (ref 0.0–0.5)
Eosinophils Relative: 3 %
HCT: 36.7 % (ref 36.0–46.0)
Hemoglobin: 12 g/dL (ref 12.0–15.0)
Immature Granulocytes: 0 %
Lymphocytes Relative: 14 %
Lymphs Abs: 0.8 10*3/uL (ref 0.7–4.0)
MCH: 29.4 pg (ref 26.0–34.0)
MCHC: 32.7 g/dL (ref 30.0–36.0)
MCV: 90 fL (ref 80.0–100.0)
Monocytes Absolute: 0.6 10*3/uL (ref 0.1–1.0)
Monocytes Relative: 10 %
Neutro Abs: 4.2 10*3/uL (ref 1.7–7.7)
Neutrophils Relative %: 72 %
Platelets: 167 10*3/uL (ref 150–400)
RBC: 4.08 MIL/uL (ref 3.87–5.11)
RDW: 13 % (ref 11.5–15.5)
WBC: 5.7 10*3/uL (ref 4.0–10.5)
nRBC: 0 % (ref 0.0–0.2)

## 2023-01-14 LAB — COMPREHENSIVE METABOLIC PANEL
ALT: 13 U/L (ref 0–44)
AST: 17 U/L (ref 15–41)
Albumin: 3.3 g/dL — ABNORMAL LOW (ref 3.5–5.0)
Alkaline Phosphatase: 86 U/L (ref 38–126)
Anion gap: 9 (ref 5–15)
BUN: 9 mg/dL (ref 8–23)
CO2: 27 mmol/L (ref 22–32)
Calcium: 8.4 mg/dL — ABNORMAL LOW (ref 8.9–10.3)
Chloride: 97 mmol/L — ABNORMAL LOW (ref 98–111)
Creatinine, Ser: 0.78 mg/dL (ref 0.44–1.00)
GFR, Estimated: >60 mL/min (ref >60)
Glucose, Bld: 110 mg/dL — ABNORMAL HIGH (ref 70–99)
Potassium: 3.6 mmol/L (ref 3.5–5.1)
Sodium: 133 mmol/L — ABNORMAL LOW (ref 135–145)
Total Bilirubin: 0.8 mg/dL (ref 0.3–1.2)
Total Protein: 6.4 g/dL — ABNORMAL LOW (ref 6.5–8.1)

## 2023-01-14 LAB — TSH: TSH: 0.631 u[IU]/mL (ref 0.350–4.500)

## 2023-01-14 LAB — MAGNESIUM: Magnesium: 2 mg/dL (ref 1.7–2.4)

## 2023-01-14 LAB — TROPONIN I (HIGH SENSITIVITY): Troponin I (High Sensitivity): 7 ng/L (ref <18)

## 2023-01-14 MED ORDER — HEPARIN SOD (PORK) LOCK FLUSH 100 UNIT/ML IV SOLN
500.0000 [IU] | Freq: Once | INTRAVENOUS | Status: AC | PRN
  Administered 2023-01-14: 500 [IU]

## 2023-01-14 MED ORDER — SODIUM CHLORIDE 0.9 % IV SOLN
200.0000 mg | Freq: Once | INTRAVENOUS | Status: AC
  Administered 2023-01-14: 200 mg via INTRAVENOUS
  Filled 2023-01-14: qty 8

## 2023-01-14 MED ORDER — SODIUM CHLORIDE 0.9% FLUSH
10.0000 mL | Freq: Once | INTRAVENOUS | Status: AC
  Administered 2023-01-14: 10 mL via INTRAVENOUS

## 2023-01-14 MED ORDER — SODIUM CHLORIDE 0.9 % IV SOLN: Freq: Once | INTRAVENOUS | Status: AC

## 2023-01-14 MED ORDER — FAMOTIDINE IN NACL 20-0.9 MG/50ML-% IV SOLN
20.0000 mg | Freq: Once | INTRAVENOUS | Status: AC
  Administered 2023-01-14: 20 mg via INTRAVENOUS
  Filled 2023-01-14: qty 50

## 2023-01-14 MED ORDER — CETIRIZINE HCL 10 MG/ML IV SOLN
5.0000 mg | Freq: Once | INTRAVENOUS | Status: AC
  Administered 2023-01-14: 5 mg via INTRAVENOUS
  Filled 2023-01-14: qty 1

## 2023-01-14 MED ORDER — SODIUM CHLORIDE 0.9% FLUSH
10.0000 mL | INTRAVENOUS | Status: DC | PRN
  Administered 2023-01-14: 10 mL

## 2023-01-14 MED ORDER — SODIUM CHLORIDE 0.9 % IV SOLN
1.0000 mg/kg | Freq: Once | INTRAVENOUS | Status: AC
  Administered 2023-01-14: 80 mg via INTRAVENOUS
  Filled 2023-01-14: qty 16

## 2023-01-14 NOTE — Progress Notes (Signed)
Patient presents today for C1D1 Yervoy + Keytruda. Vital signs witin parameters for treatment. Labs within parameters for treatment. Patient given [facility] information sheet pertaining to [facility]. Consent obtained.   Ipilimumab (YERVOY) Patient Monitoring Assessment   Is the patient experiencing any of the following general symptoms?:  [ ] Difficulty performing normal activities [ ] Feeling sluggish or cold all the time [ ] Unusual weight gain [ ] Constant or unusual headaches [ ] Feeling dizzy or faint [ ] Changes in eyesight (blurry vision, double vision, or other vision problems) [ ] Changes in mood or behavior (ex: decreased sex drive, irritability, or forgetfulness) [ ] Starting new medications (ex: steroids, other medications that lower immune response) [x ]Patient is not experiencing any of the general symptoms above.   Gastrointestinal  Patient is having 1 bowel movements each day.  Is this different from baseline? [ ] Yes [ x]No Are your stools watery or do they have a foul smell? [ ] Yes [ x]No Have you seen blood in your stools? [ ] Yes [x ]No Are your stools dark, tarry, or sticky? [ ] Yes [x ]No Are you having pain or tenderness in your belly? [ ] Yes [x ]No  Skin Does your skin itch? [ ] Yes [ x]No Do you have a rash? [ ] Yes [ x]No Has your skin blistered and/or peeled? [ ] Yes [ x]No Do you have sores in your mouth? [ ] Yes [x ]No  Hepatic Has your urine been dark or tea colored? [ ] Yes [ x]No Have you noticed that your skin or the whites of your eyes are turning yellow? [ ] Yes [x ]No Are you bleeding or bruising more easily than normal? [ ] Yes [ x]No Are you nauseous and/or vomiting? [ ] Yes [x ]No Do you have pain on the right side of your stomach? [ ] Yes [x ]No  Neurologic  Are you having unusual weakness of legs, arms, or face? [ ] Yes [x ]No Are you having numbness or tingling in your hands or feet? [ ] Yes [ x]No  Myrtha Mantis Makeba Delcastillo

## 2023-01-14 NOTE — Patient Instructions (Signed)
[facility]  Discharge Instructions: Thank you for choosing [facility] to provide your oncology and hematology care.  If you have a lab appointment with the Cancer Center - please note that after April 8th, 2024, all labs will be drawn in the cancer center.  You do not have to check in or register with the main entrance as you have in the past but will complete your check-in in the cancer center.  Wear comfortable clothing and clothing appropriate for easy access to any Portacath or PICC line.   We strive to give you quality time with your provider. You may need to reschedule your appointment if you arrive late (15 or more minutes).  Arriving late affects you and other patients whose appointments are after yours.  Also, if you miss three or more appointments without notifying the office, you may be dismissed from the clinic at the provider's discretion.      For prescription refill requests, have your pharmacy contact our office and allow 72 hours for refills to be completed.    Today you received the following chemotherapy and/or immunotherapy agents Keytruda, Yervoy. Ipilimumab Injection What is this medication? IPILIMUMAB (IP i LIM ue mab) treats some types of cancer. It works by helping your immune system slow or stop the spread of cancer cells. It is a monoclonal antibody. This medicine may be used for other purposes; ask your health care provider or pharmacist if you have questions. COMMON BRAND NAME(S): YERVOY What should I tell my care team before I take this medication? They need to know if you have any of these conditions: Allogeneic stem cell transplant (uses someone else's stem cells) Autoimmune diseases, such as Crohn disease, ulcerative colitis, lupus Nervous system problems, such as Guillain-Barre syndrome or myasthenia gravis Organ transplant An unusual or allergic reaction to ipilimumab, other medications, foods, dyes, or  preservatives Pregnant or trying to get pregnant Breast-feeding How should I use this medication? This medication is infused into a vein. It is given by your care team in a hospital or clinic setting. A special MedGuide will be given to you before each treatment. Be sure to read this information carefully each time. Talk to your care team about the use of this medication in children. While it may be prescribed for children as young as 12 years for selected conditions, precautions do apply. Overdosage: If you think you have taken too much of this medicine contact a poison control center or emergency room at once. NOTE: This medicine is only for you. Do not share this medicine with others. What if I miss a dose? Keep appointments for follow-up doses. It is important not to miss your dose. Call your care team if you are unable to keep an appointment. What may interact with this medication? Interactions are not expected. This list may not describe all possible interactions. Give your health care provider a list of all the medicines, herbs, non-prescription drugs, or dietary supplements you use. Also tell them if you smoke, drink alcohol, or use illegal drugs. Some items may interact with your medicine. What should I watch for while using this medication? Your condition will be monitored carefully while you are receiving this medication. You may need blood work while taking this medication. This medication may cause serious skin reactions. They can happen weeks to months after starting the medication. Contact your care team right away if you notice fevers or flu-like symptoms with a rash. The rash may be red  or purple and then turn into blisters or peeling of the skin. You may also notice a red rash with swelling of the face, lips, or lymph nodes in your neck or under your arms. Tell your care team right away if you have any change in your eyesight. Talk to your care team if you may be pregnant. Serious  birth defects can occur if you take this medication during pregnancy and for 3 months after the last dose. You will need a negative pregnancy test before starting this medication. Contraception is recommended while taking this medication and for 3 months after the last dose. Your care team can help you find the option that works for you. Do not breastfeed while taking this medication and for 3 months after the last dose. What side effects may I notice from receiving this medication? Side effects that you should report to your care team as soon as possible: Allergic reactions--skin rash, itching, hives, swelling of the face, lips, tongue, or throat Dry cough, shortness of breath or trouble breathing Eye pain, redness, irritation, or discharge with blurry or decreased vision Heart muscle inflammation--unusual weakness or fatigue, shortness of breath, chest pain, fast or irregular heartbeat, dizziness, swelling of the ankles, feet, or hands Hormone gland problems--headache, sensitivity to light, unusual weakness or fatigue, dizziness, fast or irregular heartbeat, increased sensitivity to cold or heat, excessive sweating, constipation, hair loss, increased thirst or amount of urine, tremors or shaking, irritability Infusion reactions--chest pain, shortness of breath or trouble breathing, feeling faint or lightheaded Kidney injury (glomerulonephritis)--decrease in the amount of urine, red or dark brown urine, foamy or bubbly urine, swelling of the ankles, hands, or feet Liver injury--right upper belly pain, loss of appetite, nausea, light-colored stool, dark yellow or brown urine, yellowing skin or eyes, unusual weakness or fatigue Pain, tingling, or numbness in the hands or feet, muscle weakness, change in vision, confusion or trouble speaking, loss of balance or coordination, trouble walking, seizures Rash, fever, and swollen lymph nodes Redness, blistering, peeling, or loosening of the skin, including  inside the mouth Sudden or severe stomach pain, bloody diarrhea, fever, nausea, vomiting Side effects that usually do not require medical attention (report to your care team if they continue or are bothersome): Bone, joint, or muscle pain Diarrhea Fatigue Loss of appetite Nausea Skin rash This list may not describe all possible side effects. Call your doctor for medical advice about side effects. You may report side effects to FDA at 1-800-FDA-1088. Where should I keep my medication? This medication is given in a hospital or clinic. It will not be stored at home. NOTE: This sheet is a summary. It may not cover all possible information. If you have questions about this medicine, talk to your doctor, pharmacist, or health care provider.  2024 Elsevier/Gold Standard (2021-09-13 00:00:00) Pembrolizumab Injection What is this medication? PEMBROLIZUMAB (PEM broe LIZ ue mab) treats some types of cancer. It works by helping your immune system slow or stop the spread of cancer cells. It is a monoclonal antibody. This medicine may be used for other purposes; ask your health care provider or pharmacist if you have questions. COMMON BRAND NAME(S): Keytruda What should I tell my care team before I take this medication? They need to know if you have any of these conditions: Allogeneic stem cell transplant (uses someone else's stem cells) Autoimmune diseases, such as Crohn disease, ulcerative colitis, lupus History of chest radiation Nervous system problems, such as Guillain-Barre syndrome, myasthenia gravis Organ transplant An unusual  or allergic reaction to pembrolizumab, other medications, foods, dyes, or preservatives Pregnant or trying to get pregnant Breast-feeding How should I use this medication? This medication is injected into a vein. It is given by your care team in a hospital or clinic setting. A special MedGuide will be given to you before each treatment. Be sure to read this  information carefully each time. Talk to your care team about the use of this medication in children. While it may be prescribed for children as young as 6 months for selected conditions, precautions do apply. Overdosage: If you think you have taken too much of this medicine contact a poison control center or emergency room at once. NOTE: This medicine is only for you. Do not share this medicine with others. What if I miss a dose? Keep appointments for follow-up doses. It is important not to miss your dose. Call your care team if you are unable to keep an appointment. What may interact with this medication? Interactions have not been studied. This list may not describe all possible interactions. Give your health care provider a list of all the medicines, herbs, non-prescription drugs, or dietary supplements you use. Also tell them if you smoke, drink alcohol, or use illegal drugs. Some items may interact with your medicine. What should I watch for while using this medication? Your condition will be monitored carefully while you are receiving this medication. You may need blood work while taking this medication. This medication may cause serious skin reactions. They can happen weeks to months after starting the medication. Contact your care team right away if you notice fevers or flu-like symptoms with a rash. The rash may be red or purple and then turn into blisters or peeling of the skin. You may also notice a red rash with swelling of the face, lips, or lymph nodes in your neck or under your arms. Tell your care team right away if you have any change in your eyesight. Talk to your care team if you may be pregnant. Serious birth defects can occur if you take this medication during pregnancy and for 4 months after the last dose. You will need a negative pregnancy test before starting this medication. Contraception is recommended while taking this medication and for 4 months after the last dose. Your  care team can help you find the option that works for you. Do not breastfeed while taking this medication and for 4 months after the last dose. What side effects may I notice from receiving this medication? Side effects that you should report to your care team as soon as possible: Allergic reactions--skin rash, itching, hives, swelling of the face, lips, tongue, or throat Dry cough, shortness of breath or trouble breathing Eye pain, redness, irritation, or discharge with blurry or decreased vision Heart muscle inflammation--unusual weakness or fatigue, shortness of breath, chest pain, fast or irregular heartbeat, dizziness, swelling of the ankles, feet, or hands Hormone gland problems--headache, sensitivity to light, unusual weakness or fatigue, dizziness, fast or irregular heartbeat, increased sensitivity to cold or heat, excessive sweating, constipation, hair loss, increased thirst or amount of urine, tremors or shaking, irritability Infusion reactions--chest pain, shortness of breath or trouble breathing, feeling faint or lightheaded Kidney injury (glomerulonephritis)--decrease in the amount of urine, red or dark brown urine, foamy or bubbly urine, swelling of the ankles, hands, or feet Liver injury--right upper belly pain, loss of appetite, nausea, light-colored stool, dark yellow or brown urine, yellowing skin or eyes, unusual weakness or fatigue Pain, tingling,  or numbness in the hands or feet, muscle weakness, change in vision, confusion or trouble speaking, loss of balance or coordination, trouble walking, seizures Rash, fever, and swollen lymph nodes Redness, blistering, peeling, or loosening of the skin, including inside the mouth Sudden or severe stomach pain, bloody diarrhea, fever, nausea, vomiting Side effects that usually do not require medical attention (report to your care team if they continue or are bothersome): Bone, joint, or muscle pain Diarrhea Fatigue Loss of  appetite Nausea Skin rash This list may not describe all possible side effects. Call your doctor for medical advice about side effects. You may report side effects to FDA at 1-800-FDA-1088. Where should I keep my medication? This medication is given in a hospital or clinic. It will not be stored at home. NOTE: This sheet is a summary. It may not cover all possible information. If you have questions about this medicine, talk to your doctor, pharmacist, or health care provider.  2024 Elsevier/Gold Standard (2021-09-10 00:00:00)       To help prevent nausea and vomiting after your treatment, we encourage you to take your nausea medication as directed.  BELOW ARE SYMPTOMS THAT SHOULD BE REPORTED IMMEDIATELY: *FEVER GREATER THAN 100.4 F (38 C) OR HIGHER *CHILLS OR SWEATING *NAUSEA AND VOMITING THAT IS NOT CONTROLLED WITH YOUR NAUSEA MEDICATION *UNUSUAL SHORTNESS OF BREATH *UNUSUAL BRUISING OR BLEEDING *URINARY PROBLEMS (pain or burning when urinating, or frequent urination) *BOWEL PROBLEMS (unusual diarrhea, constipation, pain near the anus) TENDERNESS IN MOUTH AND THROAT WITH OR WITHOUT PRESENCE OF ULCERS (sore throat, sores in mouth, or a toothache) UNUSUAL RASH, SWELLING OR PAIN  UNUSUAL VAGINAL DISCHARGE OR ITCHING   Items with * indicate a potential emergency and should be followed up as soon as possible or go to the Emergency Department if any problems should occur.  Please show the CHEMOTHERAPY ALERT CARD or IMMUNOTHERAPY ALERT CARD at check-in to the Emergency Department and triage nurse.  Should you have questions after your visit or need to cancel or reschedule your appointment, please contact [facility] [phone]  and follow the prompts.  Office hours are 8:00 a.m. to 4:30 p.m. Monday - Friday. Please note that voicemails left after 4:00 p.m. may not be returned until the following business day.  We are closed weekends and major holidays. You have access  to a nurse at all times for urgent questions. Please call the main number to the clinic [phone] and follow the prompts.  For any non-urgent questions, you may also contact your provider using MyChart. We now offer e-Visits for anyone 52 and older to request care online for non-urgent symptoms. For details visit [email or web address].   Also download the MyChart app! Go to the app store, search "MyChart", open the app, select [facility], and log in with your MyChart username and password.

## 2023-01-28 ENCOUNTER — Inpatient Hospital Stay: Payer: Medicare Other

## 2023-02-02 DIAGNOSIS — Z947 Corneal transplant status: Secondary | ICD-10-CM | POA: Diagnosis not present

## 2023-02-02 DIAGNOSIS — Z961 Presence of intraocular lens: Secondary | ICD-10-CM | POA: Diagnosis not present

## 2023-02-10 NOTE — Progress Notes (Signed)
[facility] [address], [place] [address]    Clinic Day:  02/11/2023  Referring physician: [name], MD  Patient Care Team: [name], MD as PCP - General (Internal Medicine) [name], MD as Medical Oncologist (Medical Oncology) [name], RN as Oncology Nurse Navigator (Oncology)   ASSESSMENT & PLAN:   Assessment: 1. Malignant melanoma of right posterior leg (pT4 pN3 M1): - She noticed fleshy lesion 4 months ago on the right leg. - Her PMD did a biopsy which was consistent with melanoma. - Wide local excision and lymph node biopsy by Dr. Donell Beers on 02/12/2021. - Pathology margins free, nodular type, Breslow's thickness 9.52 mm, Clark level V, deep margins free, ulceration absent, satellitosis absent, mitotic index 2/millimeters squared, LVI negative.  Neurotropism absent.  Tumor infiltrating lymphocytes absent.  Tumor regression not noted.  3 out of 3 positive sentinel lymph nodes for melanoma. - PET CT scan on 03/22/2021 with 3 foci of intense radiotracer activity within the right lower extremity.  In the medial right upper thigh nodule just superficial to thigh musculature measuring 11 mm with SUV 7.6.  Intramedullary hypermetabolic activity within the shaft of the mid right femur with SUV 14.6.  Intense activity associated with condylar notch of the distal right femur, appears to localize to soft tissue rather than the bone.  More diffuse activity associated with the medial right calf related to excision biopsy.  There is a focus of activity adjacent to the lateral margin of the right iliac wing SUV 4.1.  No evidence of visceral metastatic disease in the chest, abdomen or pelvis. 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 - NGS testing-BRAF negative, positive for NRAS  pathogenic variant exon 3, TERT promoter.  MSI-stable.  MMR-proficient.  NTRK 1/2/3 fusion not detected.  KIT mutation negative.  PD-L1 negative. - Nivolumab-Relatlimab every 28 days started on 05/09/2021.  Held permanently after first dose due to myocarditis. - PET scan on 07/25/2021: Slight progression with no new sites of metastatic disease. - Pembrolizumab started on 09/03/2021.  XRT to the soft tissue nodule within the medial right thigh and midshaft right femoral lesion from 03/26/2022 through 04/09/2022. -We have discussed further options including adding a low-dose ipilimumab to pembrolizumab which she is already receiving without any side effects.  I have quoted response rates around 30% (J Clin Oncol. 2021 Aug 20;39(24):2647-2655. doi: 10.1200/JCO.21.00079).  We also discussed the possibility increased chance of immunotherapy related side effects with combination of PD-1 and anti-CTL A4 antibody. - We also discussed option of binimetinib as she has NRAS mutation on previous NGS testing.  However this option is also associated with significant side effects and overall response rate of around 20%. - We also discussed other options including adding lenvatinib to pembrolizumab, least favorable option due to lack of robust data.  We have discussed chemotherapy options with dacarbazine/temozolomide among others. - Pembrolizumab with low-dose ipilimumab started on 01/14/2023   2. Social/family history: - She is seen with her son today.  She lives by herself at home.  She is independent of ADLs and IADLs.  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  She is non-smoker. - Father had colon cancer.  Sister had breast cancer and colon cancer.  Maternal aunt had breast cancer.  Paternal  uncle had colon cancer.    Plan: 1. Malignant melanoma of the right posterior leg (pT4 pN3 M1), BRAF V600 negative: - Cycle 1 of pembrolizumab and low-dose ipilimumab on 01/14/2023. -  Reviewed labs today: Normal LFTs and creatinine.  CBC grossly normal.  TSH is 0.4.  However troponin jumped up to 33.  She does not have any signs or symptoms of myocarditis. - Recommend holding treatment today.  We will reevaluate her in 1 week.  2.  History of immunotherapy myocarditis: - She does not report any flulike symptoms or left arm pain.  However troponin level is increased to 33 today.  We have twelve-lead EKG in our office.  No significant changes from the previous EKG.  We will repeat troponin level next week.  I have recommended that she be evaluated in the ER should she develop any flulike symptoms or pains like previously.  3.  Right medial thigh pain: - There is a small nodule in the right upper medial thigh which is tender.  Will closely monitor.  4.  Hypothyroidism: - Continue Synthroid 137 mcg daily.  TSH is 0.4.    Orders Placed This Encounter  Procedures   Magnesium    Standing Status:   Future    Standing Expiration Date:   04/22/2024   CBC with Differential    Standing Status:   Future    Standing Expiration Date:   04/23/2024   Comprehensive metabolic panel    Standing Status:   Future    Standing Expiration Date:   04/23/2024   T4    Standing Status:   Future    Standing Expiration Date:   04/23/2024   TSH    Standing Status:   Future    Standing Expiration Date:   04/23/2024   Magnesium    Standing Status:   Future    Standing Expiration Date:   05/13/2024   CBC with Differential    Standing Status:   Future    Standing Expiration Date:   05/14/2024   Comprehensive metabolic panel    Standing Status:   Future    Standing Expiration Date:   05/14/2024   T4    Standing Status:   Future    Standing Expiration Date:   05/14/2024   TSH    Standing Status:   Future    Standing Expiration Date:   05/14/2024   Magnesium    Standing Status:   Future    Standing Expiration Date:   06/03/2024   CBC with Differential    Standing Status:   Future    Standing  Expiration Date:   06/04/2024   Comprehensive metabolic panel    Standing Status:   Future    Standing Expiration Date:   06/04/2024   T4    Standing Status:   Future    Standing Expiration Date:   06/04/2024   TSH    Standing Status:   Future    Standing Expiration Date:   06/04/2024   EKG 12-Lead      I,Elizabeth Norman,acting as a scribe for [name], MD.,have documented all relevant documentation on the behalf of [name], MD,as directed by  [name], MD while in the presence of [name], MD.   I, [name] MD, have reviewed the above documentation for accuracy and completeness, and I agree with the above.   [name], MD   10/2/20244:00 PM  CHIEF COMPLAINT:   Diagnosis: metastatic malignant melanoma, BRAF V600 negative  Cancer Staging  Malignant melanoma of lower leg, right (HCC) Staging form: Melanoma of the Skin, AJCC 8th Edition - Clinical stage from 03/27/2021: Stage IV (cT4a, cN3, cM1) - Unsigned    Prior Therapy: none  Current Therapy:  Pembrolizumab (200) q21d Keytruda    HISTORY OF PRESENT ILLNESS:   Oncology History  Malignant melanoma of lower leg, right (HCC)  03/27/2021 Initial Diagnosis   Malignant melanoma of lower leg, right (HCC)   05/09/2021 - 05/09/2021 Chemotherapy   Patient is on Treatment Plan : MELANOMA - Opdualag (nivolumab/relatlimab) q28d     09/03/2021 - 01/07/2022 Chemotherapy   Patient is on Treatment Plan : MELANOMA Pembrolizumab (200) q21d     09/03/2021 -  Chemotherapy   Patient is on Treatment Plan : MELANOMA Pembrolizumab (200) + Yervoy (1mg /kg) q21d        INTERVAL HISTORY:   Elizabeth Norman is a 82 y.o. female presenting to clinic today for follow up of metastatic malignant melanoma, BRAF V600 negative. She was last seen by me on 01/07/23.  Today, she states that she is doing well overall. Her appetite level is at 100%. Her energy level is at 100%.  PAST MEDICAL  HISTORY:   Past Medical History: Past Medical History:  Diagnosis Date   Arthritis    Hypertension    Malignant melanoma of lower leg, right (HCC) 03/27/2021   Port-A-Cath in place 04/30/2021    Surgical History: Past Surgical History:  Procedure Laterality Date   CATARACT EXTRACTION W/PHACO Right 10/03/2014   Procedure: CATARACT EXTRACTION PHACO AND INTRAOCULAR LENS PLACEMENT (IOC);  Surgeon: Jethro Bolus, MD;  Location: [facility];  Service: Ophthalmology;  Laterality: Right;  CDE:10.09   CATARACT EXTRACTION W/PHACO Left 10/10/2014   Procedure: CATARACT EXTRACTION PHACO AND INTRAOCULAR LENS PLACEMENT (IOC);  Surgeon: Jethro Bolus, MD;  Location: [facility];  Service: Ophthalmology;  Laterality: Left;  CDE:8.69   CORNEAL 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   CORNEAL TRANSPLANT Left 2020   DENTAL RESTORATION/EXTRACTION WITH X-RAY     IR IMAGING GUIDED PORT INSERTION  04/05/2021   LEFT HEART CATH AND CORONARY ANGIOGRAPHY N/A 05/28/2021   Procedure: LEFT HEART CATH AND CORONARY ANGIOGRAPHY;  Surgeon: [name], MD;  Location: [facility];  Service: Cardiovascular;  Laterality: N/A;   MELANOMA EXCISION Right 02/12/2021   Procedure: WIDE LOCAL EXCISION RIGHT LOWER LEG MELANOMA , ADVANCEMENT FLAP CLOSURE DEFECT;  Surgeon: Almond Lint, MD;  Location: [facility];  Service: General;  Laterality: Right;   SENTINEL NODE BIOPSY Right 02/12/2021   Procedure: SENTINEL NODE BIOPSY;  Surgeon: Almond Lint, MD;  Location: [facility];  Service: General;  Laterality: Right;    Social History: Social History   Socioeconomic History   Marital status: Widowed    Spouse name: Not on file   Number of children: 3   Years of education: Not on file   Highest education level: Not on file  Occupational History   Not on file  Tobacco Use   Smoking status: Never   Smokeless tobacco: Never  Vaping Use   Vaping status: Never Used  Substance and Sexual Activity   Alcohol use: No   Drug use: No   Sexual  activity: Not on file  Other Topics Concern   Not on file  Social History Narrative   Not on file   Social Determinants of Health   Financial Resource Strain: Not on file  Food Insecurity: Not on file  Transportation Needs: Not on file  Physical Activity: Not on  file  Stress: Not on file  Social Connections: Not on file  Intimate Partner Violence: Not on file    Family History: Family History  Problem Relation Age of Onset   Cancer Father    Hypertension Sister    Hypertension Sister     Current Medications:  Current Outpatient Medications:    alendronate (FOSAMAX) 70 MG tablet, Take 70 mg by mouth once a week., Disp: , Rfl: 0   ALPRAZolam (XANAX) 0.5 MG tablet, Take 0.5 mg by mouth 3 (three) times daily., Disp: , Rfl: 2   Aspirin Buf,CaCarb-MgCarb-MgO, (BUFFERIN PO), Take 400 mg by mouth daily at 2 PM., Disp: , Rfl:    calcium carbonate (OS-CAL - DOSED IN MG OF ELEMENTAL CALCIUM) 1250 (500 CA) MG tablet, Take 1 tablet by mouth 3 (three) times a week., Disp: , Rfl:    cholecalciferol (VITAMIN D) 1000 UNITS tablet, Take 1,000 Units by mouth 3 (three) times a week., Disp: , Rfl:    Cyanocobalamin (VITAMIN B-12 PO), Take 1 tablet by mouth 3 (three) times a week., Disp: , Rfl:    famotidine (PEPCID) 20 MG tablet, Take 20 mg by mouth daily., Disp: , Rfl:    levothyroxine (SYNTHROID) 137 MCG tablet, Take 1 tablet (137 mcg total) by mouth daily before breakfast., Disp: 30 tablet, Rfl: 5   metoprolol tartrate (LOPRESSOR) 25 MG tablet, Take 25 mg by mouth 2 (two) times daily., Disp: , Rfl:    omeprazole (PRILOSEC) 40 MG capsule, Take 40 mg by mouth daily., Disp: , Rfl:    prednisoLONE acetate (PRED FORTE) 1 % ophthalmic suspension, Place 1 drop into both eyes See admin instructions. Instill one drop into the right eye twice daily and one drop into the left eye once daily., Disp: , Rfl:    Allergies: Allergies  Allergen Reactions   Tape     Pulls skin, please use paper tape     REVIEW OF SYSTEMS:   Review of Systems  Constitutional:  Negative for chills, fatigue and fever.  HENT:   Negative for lump/mass, mouth sores, nosebleeds, sore throat and trouble swallowing.   Eyes:  Negative for eye problems.  Respiratory:  Negative for cough and shortness of breath.   Cardiovascular:  Negative for chest pain, leg swelling and palpitations.  Gastrointestinal:  Negative for abdominal pain, constipation, diarrhea, nausea and vomiting.  Genitourinary:  Negative for bladder incontinence, difficulty urinating, dysuria, frequency, hematuria and nocturia.   Musculoskeletal:  Negative for arthralgias, back pain, flank pain, myalgias and neck pain.  Skin:  Negative for itching and rash.  Neurological:  Positive for headaches. Negative for dizziness and numbness.  Hematological:  Does not bruise/bleed easily.  Psychiatric/Behavioral:  Negative for depression, sleep disturbance and suicidal ideas. The patient is not nervous/anxious.   All other systems reviewed and are negative.    VITALS:   There were no vitals taken for this visit.  Wt Readings from Last 3 Encounters:  02/11/23 170 lb 3.2 oz (77.2 kg)  01/07/23 171 lb 15.3 oz (78 kg)  12/17/22 174 lb 12.8 oz (79.3 kg)    There is no height or weight on file to calculate BMI.  Performance status (ECOG): 1 - Symptomatic but completely ambulatory  PHYSICAL EXAM:   Physical Exam Vitals and nursing note reviewed. Exam conducted with a chaperone present.  Constitutional:      Appearance: Normal appearance.  Cardiovascular:     Rate and Rhythm: Normal rate and regular rhythm.  Pulses: Normal pulses.     Heart sounds: Normal heart sounds.  Pulmonary:     Effort: Pulmonary effort is normal.     Breath sounds: Normal breath sounds.  Abdominal:     Palpations: Abdomen is soft. There is no hepatomegaly, splenomegaly or mass.     Tenderness: There is no abdominal tenderness.  Musculoskeletal:     Right lower leg:  No edema.     Left lower leg: No edema.  Lymphadenopathy:     Cervical: No cervical adenopathy.     Right cervical: No superficial, deep or posterior cervical adenopathy.    Left cervical: No superficial, deep or posterior cervical adenopathy.     Upper Body:     Right upper body: No supraclavicular or axillary adenopathy.     Left upper body: No supraclavicular or axillary adenopathy.  Neurological:     General: No focal deficit present.     Mental Status: She is alert and oriented to person, place, and time.  Psychiatric:        Mood and Affect: Mood normal.        Behavior: Behavior normal.     LABS:      Latest Ref Rng & Units 02/11/2023    7:39 AM 01/14/2023    8:42 AM 01/07/2023    7:54 AM  CBC  WBC 4.0 - 10.5 K/uL 5.0  5.7  4.1   Hemoglobin 12.0 - 15.0 g/dL 72.5  36.6  44.0   Hematocrit 36.0 - 46.0 % 39.0  36.7  39.1   Platelets 150 - 400 K/uL 168  167  142       Latest Ref Rng & Units 02/11/2023    7:39 AM 01/14/2023    8:42 AM 01/07/2023    7:54 AM  CMP  Glucose 70 - 99 mg/dL 347  425  956   BUN 8 - 23 mg/dL 11  9  15    Creatinine 0.44 - 1.00 mg/dL 3.87  5.64  3.32   Sodium 135 - 145 mmol/L 134  133  134   Potassium 3.5 - 5.1 mmol/L 3.7  3.6  3.8   Chloride 98 - 111 mmol/L 98  97  99   CO2 22 - 32 mmol/L 27  27  28    Calcium 8.9 - 10.3 mg/dL 8.4  8.4  8.7   Total Protein 6.5 - 8.1 g/dL 6.4  6.4  6.7   Total Bilirubin 0.3 - 1.2 mg/dL 0.5  0.8  0.8   Alkaline Phos 38 - 126 U/L 89  86  84   AST 15 - 41 U/L 22  17  24    ALT 0 - 44 U/L 13  13  13       No results found for: "CEA1", "CEA" / No results found for: "CEA1", "CEA" No results found for: "PSA1" No results found for: "RJJ884" No results found for: "CAN125"  No results found for: "TOTALPROTELP", "ALBUMINELP", "A1GS", "A2GS", "BETS", "BETA2SER", "GAMS", "MSPIKE", "SPEI" No results found for: "TIBC", "FERRITIN", "IRONPCTSAT" Lab Results  Component Value Date   LDH 137 06/05/2022   LDH 144 05/13/2022    LDH 168 11/05/2021     STUDIES:   No results found.

## 2023-02-11 ENCOUNTER — Inpatient Hospital Stay: Payer: Medicare Other

## 2023-02-11 ENCOUNTER — Inpatient Hospital Stay: Payer: Medicare Other | Attending: Hematology | Admitting: Hematology

## 2023-02-11 ENCOUNTER — Other Ambulatory Visit: Payer: Self-pay

## 2023-02-11 VITALS — BP 129/58 | HR 65 | Temp 97.6°F | Resp 20 | Wt 170.2 lb

## 2023-02-11 DIAGNOSIS — Z8679 Personal history of other diseases of the circulatory system: Secondary | ICD-10-CM | POA: Diagnosis not present

## 2023-02-11 DIAGNOSIS — C4371 Malignant melanoma of right lower limb, including hip: Secondary | ICD-10-CM | POA: Diagnosis not present

## 2023-02-11 DIAGNOSIS — Z800 Family history of malignant neoplasm of digestive organs: Secondary | ICD-10-CM | POA: Insufficient documentation

## 2023-02-11 DIAGNOSIS — R7989 Other specified abnormal findings of blood chemistry: Secondary | ICD-10-CM

## 2023-02-11 DIAGNOSIS — Z79890 Hormone replacement therapy (postmenopausal): Secondary | ICD-10-CM | POA: Diagnosis not present

## 2023-02-11 DIAGNOSIS — M79651 Pain in right thigh: Secondary | ICD-10-CM | POA: Insufficient documentation

## 2023-02-11 DIAGNOSIS — E039 Hypothyroidism, unspecified: Secondary | ICD-10-CM | POA: Diagnosis not present

## 2023-02-11 DIAGNOSIS — Z95828 Presence of other vascular implants and grafts: Secondary | ICD-10-CM

## 2023-02-11 DIAGNOSIS — Z79620 Long term (current) use of immunosuppressive biologic: Secondary | ICD-10-CM | POA: Insufficient documentation

## 2023-02-11 DIAGNOSIS — C7951 Secondary malignant neoplasm of bone: Secondary | ICD-10-CM | POA: Insufficient documentation

## 2023-02-11 DIAGNOSIS — R2241 Localized swelling, mass and lump, right lower limb: Secondary | ICD-10-CM | POA: Diagnosis not present

## 2023-02-11 DIAGNOSIS — I514 Myocarditis, unspecified: Secondary | ICD-10-CM

## 2023-02-11 DIAGNOSIS — Z803 Family history of malignant neoplasm of breast: Secondary | ICD-10-CM | POA: Insufficient documentation

## 2023-02-11 DIAGNOSIS — Z5112 Encounter for antineoplastic immunotherapy: Secondary | ICD-10-CM | POA: Diagnosis not present

## 2023-02-11 LAB — TROPONIN I (HIGH SENSITIVITY): Troponin I (High Sensitivity): 33 ng/L — ABNORMAL HIGH (ref <18)

## 2023-02-11 LAB — CBC WITH DIFFERENTIAL/PLATELET
Abs Immature Granulocytes: 0.01 10*3/uL (ref 0.00–0.07)
Basophils Absolute: 0.1 10*3/uL (ref 0.0–0.1)
Basophils Relative: 1 %
Eosinophils Absolute: 0.2 10*3/uL (ref 0.0–0.5)
Eosinophils Relative: 4 %
HCT: 39 % (ref 36.0–46.0)
Hemoglobin: 12.2 g/dL (ref 12.0–15.0)
Immature Granulocytes: 0 %
Lymphocytes Relative: 21 %
Lymphs Abs: 1.1 10*3/uL (ref 0.7–4.0)
MCH: 28.3 pg (ref 26.0–34.0)
MCHC: 31.3 g/dL (ref 30.0–36.0)
MCV: 90.5 fL (ref 80.0–100.0)
Monocytes Absolute: 0.4 10*3/uL (ref 0.1–1.0)
Monocytes Relative: 8 %
Neutro Abs: 3.3 10*3/uL (ref 1.7–7.7)
Neutrophils Relative %: 66 %
Platelets: 168 10*3/uL (ref 150–400)
RBC: 4.31 MIL/uL (ref 3.87–5.11)
RDW: 13 % (ref 11.5–15.5)
WBC: 5 10*3/uL (ref 4.0–10.5)
nRBC: 0 % (ref 0.0–0.2)

## 2023-02-11 LAB — COMPREHENSIVE METABOLIC PANEL
ALT: 13 U/L (ref 0–44)
AST: 22 U/L (ref 15–41)
Albumin: 3.3 g/dL — ABNORMAL LOW (ref 3.5–5.0)
Alkaline Phosphatase: 89 U/L (ref 38–126)
Anion gap: 9 (ref 5–15)
BUN: 11 mg/dL (ref 8–23)
CO2: 27 mmol/L (ref 22–32)
Calcium: 8.4 mg/dL — ABNORMAL LOW (ref 8.9–10.3)
Chloride: 98 mmol/L (ref 98–111)
Creatinine, Ser: 0.8 mg/dL (ref 0.44–1.00)
GFR, Estimated: >60 mL/min (ref >60)
Glucose, Bld: 122 mg/dL — ABNORMAL HIGH (ref 70–99)
Potassium: 3.7 mmol/L (ref 3.5–5.1)
Sodium: 134 mmol/L — ABNORMAL LOW (ref 135–145)
Total Bilirubin: 0.5 mg/dL (ref 0.3–1.2)
Total Protein: 6.4 g/dL — ABNORMAL LOW (ref 6.5–8.1)

## 2023-02-11 LAB — TSH: TSH: 0.476 u[IU]/mL (ref 0.350–4.500)

## 2023-02-11 LAB — MAGNESIUM: Magnesium: 2 mg/dL (ref 1.7–2.4)

## 2023-02-11 MED ORDER — SODIUM CHLORIDE 0.9% FLUSH
10.0000 mL | Freq: Once | INTRAVENOUS | Status: AC
  Administered 2023-02-11: 10 mL

## 2023-02-11 MED ORDER — HEPARIN SOD (PORK) LOCK FLUSH 100 UNIT/ML IV SOLN
500.0000 [IU] | Freq: Once | INTRAVENOUS | Status: AC
  Administered 2023-02-11: 500 [IU] via INTRAVENOUS

## 2023-02-11 NOTE — Progress Notes (Signed)
Patient has been examined by Dr. Katragadda. Vital signs and labs have been reviewed by MD - ANC, Creatinine, LFTs, hemoglobin, and platelets are within treatment parameters per M.D. - pt may proceed with treatment.  Primary RN and pharmacy notified.  

## 2023-02-11 NOTE — Patient Instructions (Signed)

## 2023-02-11 NOTE — Progress Notes (Signed)
Labs reviewed today with MD. Troponin was 33. Hold treatment today per MD

## 2023-02-12 LAB — T4: T4, Total: 11.1 ug/dL (ref 4.5–12.0)

## 2023-02-18 ENCOUNTER — Inpatient Hospital Stay: Payer: Medicare Other

## 2023-02-18 ENCOUNTER — Inpatient Hospital Stay: Payer: Medicare Other | Admitting: Hematology

## 2023-02-18 NOTE — Progress Notes (Signed)
[facility] [address] [place] [address]    Clinic Day:  02/18/2023  Referring physician: [name], MD  Patient Care Team: [name], MD as PCP - General (Internal Medicine) [name], MD as Medical Oncologist (Medical Oncology) [name], RN as Oncology Nurse Navigator (Oncology)   ASSESSMENT & PLAN:   Assessment: 1. Malignant melanoma of right posterior leg (pT4 pN3 M1): - She noticed fleshy lesion 4 months ago on the right leg. - Her PMD did a biopsy which was consistent with melanoma. - Wide local excision and lymph node biopsy by Dr. Donell Beers on 02/12/2021. - Pathology margins free, nodular type, Breslow's thickness 9.52 mm, Clark level V, deep margins free, ulceration absent, satellitosis absent, mitotic index 2/millimeters squared, LVI negative.  Neurotropism absent.  Tumor infiltrating lymphocytes absent.  Tumor regression not noted.  3 out of 3 positive sentinel lymph nodes for melanoma. - PET CT scan on 03/22/2021 with 3 foci of intense radiotracer activity within the right lower extremity.  In the medial right upper thigh nodule just superficial to thigh musculature measuring 11 mm with SUV 7.6.  Intramedullary hypermetabolic activity within the shaft of the mid right femur with SUV 14.6.  Intense activity associated with condylar notch of the distal right femur, appears to localize to soft tissue rather than the bone.  More diffuse activity associated with the medial right calf related to excision biopsy.  There is a focus of activity adjacent to the lateral margin of the right iliac wing SUV 4.1.  No evidence of visceral metastatic disease in the chest, abdomen or pelvis. 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 - NGS testing-BRAF negative, positive for NRAS  pathogenic variant exon 3, TERT promoter.  MSI-stable.  MMR-proficient.  NTRK 1/2/3 fusion not detected.  KIT mutation negative.  PD-L1 negative. - Nivolumab-Relatlimab every 28 days started on 05/09/2021.  Held permanently after first dose due to myocarditis. - PET scan on 07/25/2021: Slight progression with no new sites of metastatic disease. - Pembrolizumab started on 09/03/2021.  XRT to the soft tissue nodule within the medial right thigh and midshaft right femoral lesion from 03/26/2022 through 04/09/2022. -We have discussed further options including adding a low-dose ipilimumab to pembrolizumab which she is already receiving without any side effects.  I have quoted response rates around 30% (J Clin Oncol. 2021 Aug 20;39(24):2647-2655. doi: 10.1200/JCO.21.00079).  We also discussed the possibility increased chance of immunotherapy related side effects with combination of PD-1 and anti-CTL A4 antibody. - We also discussed option of binimetinib as she has NRAS mutation on previous NGS testing.  However this option is also associated with significant side effects and overall response rate of around 20%. - We also discussed other options including adding lenvatinib to pembrolizumab, least favorable option due to lack of robust data.  We have discussed chemotherapy options with dacarbazine/temozolomide among others. - Pembrolizumab with low-dose ipilimumab started on 01/14/2023   2. Social/family history: - She is seen with her son today.  She lives by herself at home.  She is independent of ADLs and IADLs.  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  She is non-smoker. - Father had colon cancer.  Sister had breast cancer and colon cancer.  Maternal aunt had breast cancer.  Paternal  uncle had colon cancer.    Plan: 1. Malignant melanoma of the right posterior leg (pT4 pN3 M1), BRAF V600 negative: - Cycle 1 of pembrolizumab and low-dose ipilimumab on 01/14/2023. -  Reviewed labs today: Normal LFTs and creatinine.  CBC grossly normal.  TSH is 0.4.  However troponin jumped up to 33.  She does not have any signs or symptoms of myocarditis. - Recommend holding treatment today.  We will reevaluate her in 1 week.  2.  History of immunotherapy myocarditis: - She does not report any flulike symptoms or left arm pain.  However troponin level is increased to 33 today.  We have twelve-lead EKG in our office.  No significant changes from the previous EKG.  We will repeat troponin level next week.  I have recommended that she be evaluated in the ER should she develop any flulike symptoms or pains like previously.  3.  Right medial thigh pain: - There is a small nodule in the right upper medial thigh which is tender.  Will closely monitor.  4.  Hypothyroidism: - Continue Synthroid 137 mcg daily.  TSH is 0.4.    No orders of the defined types were placed in this encounter.     [name],acting as a Neurosurgeon for [name], MD.,have documented all relevant documentation on the behalf of [name], MD,as directed by  [name], MD while in the presence of [name], MD.  ***   Lissa Hoard R Teague   10/9/20249:23 PM  CHIEF COMPLAINT:   Diagnosis: metastatic malignant melanoma, BRAF V600 negative    Cancer Staging  Malignant melanoma of lower leg, right (HCC) Staging form: Melanoma of the Skin, AJCC 8th Edition - Clinical stage from 03/27/2021: Stage IV (cT4a, cN3, cM1) - Unsigned    Prior Therapy: none  Current Therapy:  Pembrolizumab (200) q21d Keytruda    HISTORY OF PRESENT ILLNESS:   Oncology History  Malignant melanoma of lower leg, right (HCC)  03/27/2021 Initial Diagnosis   Malignant melanoma of lower leg, right (HCC)   05/09/2021 - 05/09/2021 Chemotherapy   Patient is on Treatment Plan : MELANOMA - Opdualag (nivolumab/relatlimab) q28d     09/03/2021 - 01/07/2022 Chemotherapy   Patient is on  Treatment Plan : MELANOMA Pembrolizumab (200) q21d     09/03/2021 -  Chemotherapy   Patient is on Treatment Plan : MELANOMA Pembrolizumab (200) + Yervoy (1mg /kg) q21d        INTERVAL HISTORY:   Eboney is a 82 y.o. female presenting to clinic today for follow up of metastatic malignant melanoma, BRAF V600 negative. She was last seen by me on 02/11/23.  Today, she states that she is doing well overall. Her appetite level is at ***%. Her energy level is at ***%.  PAST MEDICAL HISTORY:   Past Medical History: Past Medical History:  Diagnosis Date   Arthritis    Hypertension    Malignant melanoma of lower leg, right (HCC) 03/27/2021   Port-A-Cath in place 04/30/2021    Surgical History: Past Surgical History:  Procedure Laterality Date   CATARACT EXTRACTION W/PHACO Right 10/03/2014   Procedure: CATARACT EXTRACTION PHACO AND INTRAOCULAR LENS PLACEMENT (IOC);  Surgeon: Jethro Bolus, MD;  Location: [facility];  Service: Ophthalmology;  Laterality: Right;  CDE:10.09   CATARACT EXTRACTION W/PHACO Left 10/10/2014   Procedure: CATARACT EXTRACTION PHACO AND INTRAOCULAR LENS PLACEMENT (IOC);  Surgeon: Jethro Bolus, MD;  Location: [facility];  Service: Ophthalmology;  Laterality: Left;  CDE:8.69   CORNEAL 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  CORNEAL TRANSPLANT Left 2020   DENTAL RESTORATION/EXTRACTION WITH X-RAY     IR IMAGING GUIDED PORT INSERTION  04/05/2021   LEFT HEART CATH AND CORONARY ANGIOGRAPHY N/A 05/28/2021   Procedure: LEFT HEART CATH AND CORONARY ANGIOGRAPHY;  Surgeon: [name], MD;  Location: [facility];  Service: Cardiovascular;  Laterality: N/A;   MELANOMA EXCISION Right 02/12/2021   Procedure: WIDE LOCAL EXCISION RIGHT LOWER LEG MELANOMA , ADVANCEMENT FLAP CLOSURE DEFECT;  Surgeon: Almond Lint, MD;  Location: [facility];  Service: General;  Laterality: Right;   SENTINEL NODE BIOPSY Right 02/12/2021   Procedure: SENTINEL NODE BIOPSY;  Surgeon: Almond Lint, MD;  Location: [facility];   Service: General;  Laterality: Right;    Social History: Social History   Socioeconomic History   Marital status: Widowed    Spouse name: Not on file   Number of children: 3   Years of education: Not on file   Highest education level: Not on file  Occupational History   Not on file  Tobacco Use   Smoking status: Never   Smokeless tobacco: Never  Vaping Use   Vaping status: Never Used  Substance and Sexual Activity   Alcohol use: No   Drug use: No   Sexual activity: Not on file  Other Topics Concern   Not on file  Social History Narrative   Not on file   Social Determinants of Health   Financial Resource Strain: Not on file  Food Insecurity: Not on file  Transportation Needs: Not on file  Physical Activity: Not on file  Stress: Not on file  Social Connections: Not on file  Intimate Partner Violence: Not on file    Family History: Family History  Problem Relation Age of Onset   Cancer Father    Hypertension Sister    Hypertension Sister     Current Medications:  Current Outpatient Medications:    alendronate (FOSAMAX) 70 MG tablet, Take 70 mg by mouth once a week., Disp: , Rfl: 0   ALPRAZolam (XANAX) 0.5 MG tablet, Take 0.5 mg by mouth 3 (three) times daily., Disp: , Rfl: 2   Aspirin Buf,CaCarb-MgCarb-MgO, (BUFFERIN PO), Take 400 mg by mouth daily at 2 PM., Disp: , Rfl:    calcium carbonate (OS-CAL - DOSED IN MG OF ELEMENTAL CALCIUM) 1250 (500 CA) MG tablet, Take 1 tablet by mouth 3 (three) times a week., Disp: , Rfl:    cholecalciferol (VITAMIN D) 1000 UNITS tablet, Take 1,000 Units by mouth 3 (three) times a week., Disp: , Rfl:    Cyanocobalamin (VITAMIN B-12 PO), Take 1 tablet by mouth 3 (three) times a week., Disp: , Rfl:    famotidine (PEPCID) 20 MG tablet, Take 20 mg by mouth daily., Disp: , Rfl:    levothyroxine (SYNTHROID) 137 MCG tablet, Take 1 tablet (137 mcg total) by mouth daily before breakfast., Disp: 30 tablet, Rfl: 5   metoprolol tartrate  (LOPRESSOR) 25 MG tablet, Take 25 mg by mouth 2 (two) times daily., Disp: , Rfl:    omeprazole (PRILOSEC) 40 MG capsule, Take 40 mg by mouth daily., Disp: , Rfl:    prednisoLONE acetate (PRED FORTE) 1 % ophthalmic suspension, Place 1 drop into both eyes See admin instructions. Instill one drop into the right eye twice daily and one drop into the left eye once daily., Disp: , Rfl:    Allergies: Allergies  Allergen Reactions   Tape     Pulls skin, please use paper tape  REVIEW OF SYSTEMS:   Review of Systems  Constitutional:  Negative for chills, fatigue and fever.  HENT:   Negative for lump/mass, mouth sores, nosebleeds, sore throat and trouble swallowing.   Eyes:  Negative for eye problems.  Respiratory:  Negative for cough and shortness of breath.   Cardiovascular:  Negative for chest pain, leg swelling and palpitations.  Gastrointestinal:  Negative for abdominal pain, constipation, diarrhea, nausea and vomiting.  Genitourinary:  Negative for bladder incontinence, difficulty urinating, dysuria, frequency, hematuria and nocturia.   Musculoskeletal:  Negative for arthralgias, back pain, flank pain, myalgias and neck pain.  Skin:  Negative for itching and rash.  Neurological:  Negative for dizziness, headaches and numbness.  Hematological:  Does not bruise/bleed easily.  Psychiatric/Behavioral:  Negative for depression, sleep disturbance and suicidal ideas. The patient is not nervous/anxious.   All other systems reviewed and are negative.    VITALS:   There were no vitals taken for this visit.  Wt Readings from Last 3 Encounters:  02/11/23 170 lb 3.2 oz (77.2 kg)  01/07/23 171 lb 15.3 oz (78 kg)  12/17/22 174 lb 12.8 oz (79.3 kg)    There is no height or weight on file to calculate BMI.  Performance status (ECOG): 1 - Symptomatic but completely ambulatory  PHYSICAL EXAM:   Physical Exam Vitals and nursing note reviewed. Exam conducted with a chaperone present.   Constitutional:      Appearance: Normal appearance.  Cardiovascular:     Rate and Rhythm: Normal rate and regular rhythm.     Pulses: Normal pulses.     Heart sounds: Normal heart sounds.  Pulmonary:     Effort: Pulmonary effort is normal.     Breath sounds: Normal breath sounds.  Abdominal:     Palpations: Abdomen is soft. There is no hepatomegaly, splenomegaly or mass.     Tenderness: There is no abdominal tenderness.  Musculoskeletal:     Right lower leg: No edema.     Left lower leg: No edema.  Lymphadenopathy:     Cervical: No cervical adenopathy.     Right cervical: No superficial, deep or posterior cervical adenopathy.    Left cervical: No superficial, deep or posterior cervical adenopathy.     Upper Body:     Right upper body: No supraclavicular or axillary adenopathy.     Left upper body: No supraclavicular or axillary adenopathy.  Neurological:     General: No focal deficit present.     Mental Status: She is alert and oriented to person, place, and time.  Psychiatric:        Mood and Affect: Mood normal.        Behavior: Behavior normal.    LABS:      Latest Ref Rng & Units 02/11/2023    7:39 AM 01/14/2023    8:42 AM 01/07/2023    7:54 AM  CBC  WBC 4.0 - 10.5 K/uL 5.0  5.7  4.1   Hemoglobin 12.0 - 15.0 g/dL 45.4  09.8  11.9   Hematocrit 36.0 - 46.0 % 39.0  36.7  39.1   Platelets 150 - 400 K/uL 168  167  142       Latest Ref Rng & Units 02/11/2023    7:39 AM 01/14/2023    8:42 AM 01/07/2023    7:54 AM  CMP  Glucose 70 - 99 mg/dL 147  829  562   BUN 8 - 23 mg/dL 11  9  15  Creatinine 0.44 - 1.00 mg/dL 9.56  2.13  0.86   Sodium 135 - 145 mmol/L 134  133  134   Potassium 3.5 - 5.1 mmol/L 3.7  3.6  3.8   Chloride 98 - 111 mmol/L 98  97  99   CO2 22 - 32 mmol/L 27  27  28    Calcium 8.9 - 10.3 mg/dL 8.4  8.4  8.7   Total Protein 6.5 - 8.1 g/dL 6.4  6.4  6.7   Total Bilirubin 0.3 - 1.2 mg/dL 0.5  0.8  0.8   Alkaline Phos 38 - 126 U/L 89  86  84   AST 15 - 41  U/L 22  17  24    ALT 0 - 44 U/L 13  13  13       No results found for: "CEA1", "CEA" / No results found for: "CEA1", "CEA" No results found for: "PSA1" No results found for: "VHQ469" No results found for: "CAN125"  No results found for: "TOTALPROTELP", "ALBUMINELP", "A1GS", "A2GS", "BETS", "BETA2SER", "GAMS", "MSPIKE", "SPEI" No results found for: "TIBC", "FERRITIN", "IRONPCTSAT" Lab Results  Component Value Date   LDH 137 06/05/2022   LDH 144 05/13/2022   LDH 168 11/05/2021     STUDIES:   No results found.

## 2023-02-19 ENCOUNTER — Inpatient Hospital Stay: Payer: Medicare Other

## 2023-02-19 ENCOUNTER — Inpatient Hospital Stay: Payer: Medicare Other | Admitting: Hematology

## 2023-02-19 VITALS — BP 124/55 | HR 64 | Temp 97.0°F | Resp 18

## 2023-02-19 DIAGNOSIS — Z8679 Personal history of other diseases of the circulatory system: Secondary | ICD-10-CM | POA: Diagnosis not present

## 2023-02-19 DIAGNOSIS — R2241 Localized swelling, mass and lump, right lower limb: Secondary | ICD-10-CM | POA: Diagnosis not present

## 2023-02-19 DIAGNOSIS — Z95828 Presence of other vascular implants and grafts: Secondary | ICD-10-CM

## 2023-02-19 DIAGNOSIS — I514 Myocarditis, unspecified: Secondary | ICD-10-CM

## 2023-02-19 DIAGNOSIS — Z5112 Encounter for antineoplastic immunotherapy: Secondary | ICD-10-CM | POA: Diagnosis not present

## 2023-02-19 DIAGNOSIS — Z803 Family history of malignant neoplasm of breast: Secondary | ICD-10-CM | POA: Diagnosis not present

## 2023-02-19 DIAGNOSIS — Z79620 Long term (current) use of immunosuppressive biologic: Secondary | ICD-10-CM | POA: Diagnosis not present

## 2023-02-19 DIAGNOSIS — C4371 Malignant melanoma of right lower limb, including hip: Secondary | ICD-10-CM

## 2023-02-19 DIAGNOSIS — C7951 Secondary malignant neoplasm of bone: Secondary | ICD-10-CM | POA: Diagnosis not present

## 2023-02-19 DIAGNOSIS — Z800 Family history of malignant neoplasm of digestive organs: Secondary | ICD-10-CM | POA: Diagnosis not present

## 2023-02-19 DIAGNOSIS — M79651 Pain in right thigh: Secondary | ICD-10-CM | POA: Diagnosis not present

## 2023-02-19 DIAGNOSIS — E039 Hypothyroidism, unspecified: Secondary | ICD-10-CM | POA: Diagnosis not present

## 2023-02-19 LAB — COMPREHENSIVE METABOLIC PANEL
ALT: 20 U/L (ref 0–44)
AST: 19 U/L (ref 15–41)
Albumin: 3.3 g/dL — ABNORMAL LOW (ref 3.5–5.0)
Alkaline Phosphatase: 104 U/L (ref 38–126)
Anion gap: 7 (ref 5–15)
BUN: 11 mg/dL (ref 8–23)
CO2: 28 mmol/L (ref 22–32)
Calcium: 8.5 mg/dL — ABNORMAL LOW (ref 8.9–10.3)
Chloride: 98 mmol/L (ref 98–111)
Creatinine, Ser: 0.73 mg/dL (ref 0.44–1.00)
GFR, Estimated: >60 mL/min (ref >60)
Glucose, Bld: 108 mg/dL — ABNORMAL HIGH (ref 70–99)
Potassium: 3.7 mmol/L (ref 3.5–5.1)
Sodium: 133 mmol/L — ABNORMAL LOW (ref 135–145)
Total Bilirubin: 0.8 mg/dL (ref 0.3–1.2)
Total Protein: 6.4 g/dL — ABNORMAL LOW (ref 6.5–8.1)

## 2023-02-19 LAB — CBC WITH DIFFERENTIAL/PLATELET
Abs Immature Granulocytes: 0.01 10*3/uL (ref 0.00–0.07)
Basophils Absolute: 0.1 10*3/uL (ref 0.0–0.1)
Basophils Relative: 1 %
Eosinophils Absolute: 0.2 10*3/uL (ref 0.0–0.5)
Eosinophils Relative: 4 %
HCT: 36.5 % (ref 36.0–46.0)
Hemoglobin: 11.8 g/dL — ABNORMAL LOW (ref 12.0–15.0)
Immature Granulocytes: 0 %
Lymphocytes Relative: 17 %
Lymphs Abs: 0.8 10*3/uL (ref 0.7–4.0)
MCH: 29.1 pg (ref 26.0–34.0)
MCHC: 32.3 g/dL (ref 30.0–36.0)
MCV: 89.9 fL (ref 80.0–100.0)
Monocytes Absolute: 0.5 10*3/uL (ref 0.1–1.0)
Monocytes Relative: 10 %
Neutro Abs: 3.5 10*3/uL (ref 1.7–7.7)
Neutrophils Relative %: 68 %
Platelets: 146 10*3/uL — ABNORMAL LOW (ref 150–400)
RBC: 4.06 MIL/uL (ref 3.87–5.11)
RDW: 13.2 % (ref 11.5–15.5)
WBC: 5.1 10*3/uL (ref 4.0–10.5)
nRBC: 0 % (ref 0.0–0.2)

## 2023-02-19 LAB — TROPONIN I (HIGH SENSITIVITY): Troponin I (High Sensitivity): 11 ng/L (ref <18)

## 2023-02-19 LAB — MAGNESIUM: Magnesium: 2 mg/dL (ref 1.7–2.4)

## 2023-02-19 LAB — TSH: TSH: 0.281 u[IU]/mL — ABNORMAL LOW (ref 0.350–4.500)

## 2023-02-19 MED ORDER — SODIUM CHLORIDE 0.9 % IV SOLN
200.0000 mg | Freq: Once | INTRAVENOUS | Status: AC
  Administered 2023-02-19: 200 mg via INTRAVENOUS
  Filled 2023-02-19: qty 8

## 2023-02-19 MED ORDER — SODIUM CHLORIDE 0.9% FLUSH
10.0000 mL | INTRAVENOUS | Status: AC
  Administered 2023-02-19: 10 mL

## 2023-02-19 MED ORDER — SODIUM CHLORIDE 0.9 % IV SOLN: Freq: Once | INTRAVENOUS | Status: AC

## 2023-02-19 MED ORDER — HEPARIN SOD (PORK) LOCK FLUSH 100 UNIT/ML IV SOLN
500.0000 [IU] | Freq: Once | INTRAVENOUS | Status: AC | PRN
  Administered 2023-02-19: 500 [IU]

## 2023-02-19 MED ORDER — SODIUM CHLORIDE 0.9% FLUSH
10.0000 mL | INTRAVENOUS | Status: DC | PRN
  Administered 2023-02-19: 10 mL

## 2023-02-19 NOTE — Progress Notes (Deleted)
lIpilimumab (YERVOY) Patient Monitoring Assessment   Is the patient experiencing any of the following general symptoms?:  [N]Difficulty performing normal activities [N]Feeling sluggish or cold all the time [N ]Unusual weight gain [N ]Constant or unusual headaches [N ]Feeling dizzy or faint [ N]Changes in eyesight (blurry vision, double vision, or other vision problems) [ N]Changes in mood or behavior (ex: decreased sex drive, irritability, or forgetfulness) [N ]Starting new medications (ex: steroids, other medications that lower immune response) [N ]Patient is not experiencing any of the general symptoms above.   Gastrointestinal  Patient is having 1 bowel movements each day.  Is this different from baseline? [ ] Yes [ X]No Are your stools watery or do they have a foul smell? [ ] Yes [ X]No Have you seen blood in your stools? [ ] Yes [ X]No Are your stools dark, tarry, or sticky? [ ] Yes [X ]No Are you having pain or tenderness in your belly? [ ] Yes [ X]No  Skin Does your skin itch? [ ] Yes [ X]No Do you have a rash? [ ] Yes [ X]No Has your skin blistered and/or peeled? [ ] Yes [ X]No Do you have sores in your mouth? [ ] Yes [ X]No  Hepatic Has your urine been dark or tea colored? [ ] Yes [X ]No Have you noticed that your skin or the whites of your eyes are turning yellow? [ ] Yes [X ]No Are you bleeding or bruising more easily than normal? [ ] Yes [ X]No Are you nauseous and/or vomiting? [ ] Yes [ X]No Do you have pain on the right side of your stomach? [ ] Yes [X ]No  Neurologic  Are you having unusual weakness of legs, arms, or face? [ ] Yes [X ]No Are you having numbness or tingling in your hands or feet? [ ] Yes [X ]No  Worthy Rancher

## 2023-02-19 NOTE — Progress Notes (Signed)
Patient presents today for Yervoy/Keytruda infusion per providers order.  Vital signs and labs reviewed by MD.  Message received from Chapman Moss RN/Dr. Ellin Saba patient okay for treatment but only receiving Keytruda today.  Stable during infusion without adverse affects.  Vital signs stable.  No complaints at this time.  Discharge from clinic ambulatory in stable condition.  Alert and oriented X 3.  Follow up with [facility] as scheduled.

## 2023-02-19 NOTE — Progress Notes (Signed)
Patient has been examined by Dr. Ellin Saba. Vital signs and labs have been reviewed by MD - ANC, Creatinine, LFTs, hemoglobin, and platelets are within treatment parameters per M.D. - pt may proceed with treatment. Keytruda only today per MD. Primary RN and pharmacy notified.

## 2023-02-19 NOTE — Patient Instructions (Signed)
[facility]  Discharge Instructions: Thank you for choosing [facility] to provide your oncology and hematology care.  If you have a lab appointment with the Cancer Center - please note that after April 8th, 2024, all labs will be drawn in the cancer center.  You do not have to check in or register with the main entrance as you have in the past but will complete your check-in in the cancer center.  Wear comfortable clothing and clothing appropriate for easy access to any Portacath or PICC line.   We strive to give you quality time with your provider. You may need to reschedule your appointment if you arrive late (15 or more minutes).  Arriving late affects you and other patients whose appointments are after yours.  Also, if you miss three or more appointments without notifying the office, you may be dismissed from the clinic at the provider's discretion.      For prescription refill requests, have your pharmacy contact our office and allow 72 hours for refills to be completed.    Today you received the following chemotherapy and/or immunotherapy agents Keytruda      To help prevent nausea and vomiting after your treatment, we encourage you to take your nausea medication as directed.  BELOW ARE SYMPTOMS THAT SHOULD BE REPORTED IMMEDIATELY: *FEVER GREATER THAN 100.4 F (38 C) OR HIGHER *CHILLS OR SWEATING *NAUSEA AND VOMITING THAT IS NOT CONTROLLED WITH YOUR NAUSEA MEDICATION *UNUSUAL SHORTNESS OF BREATH *UNUSUAL BRUISING OR BLEEDING *URINARY PROBLEMS (pain or burning when urinating, or frequent urination) *BOWEL PROBLEMS (unusual diarrhea, constipation, pain near the anus) TENDERNESS IN MOUTH AND THROAT WITH OR WITHOUT PRESENCE OF ULCERS (sore throat, sores in mouth, or a toothache) UNUSUAL RASH, SWELLING OR PAIN  UNUSUAL VAGINAL DISCHARGE OR ITCHING   Items with * indicate a potential emergency and should be followed up as soon as possible or go to the  Emergency Department if any problems should occur.  Please show the CHEMOTHERAPY ALERT CARD or IMMUNOTHERAPY ALERT CARD at check-in to the Emergency Department and triage nurse.  Should you have questions after your visit or need to cancel or reschedule your appointment, please contact [facility] [phone]  and follow the prompts.  Office hours are 8:00 a.m. to 4:30 p.m. Monday - Friday. Please note that voicemails left after 4:00 p.m. may not be returned until the following business day.  We are closed weekends and major holidays. You have access to a nurse at all times for urgent questions. Please call the main number to the clinic [phone] and follow the prompts.  For any non-urgent questions, you may also contact your provider using MyChart. We now offer e-Visits for anyone 18 and older to request care online for non-urgent symptoms. For details visit [email or web address].   Also download the MyChart app! Go to the app store, search "MyChart", open the app, select [place], and log in with your MyChart username and password.   

## 2023-02-19 NOTE — Patient Instructions (Signed)

## 2023-02-20 LAB — T4: T4, Total: 11.2 ug/dL (ref 4.5–12.0)

## 2023-02-24 ENCOUNTER — Telehealth: Payer: Self-pay | Admitting: Hematology

## 2023-02-24 NOTE — Telephone Encounter (Signed)
Responded to a questionnaire from [facility], that the pt bought in to [place]. [employer] questioned if visit on 07/02/22 was due to an accident or work related.  Responded no  [email or web address]

## 2023-03-11 ENCOUNTER — Inpatient Hospital Stay: Payer: Medicare Other | Admitting: Hematology

## 2023-03-11 ENCOUNTER — Inpatient Hospital Stay: Payer: Medicare Other

## 2023-03-11 VITALS — BP 108/54 | HR 62 | Temp 97.3°F | Resp 18

## 2023-03-11 DIAGNOSIS — C7951 Secondary malignant neoplasm of bone: Secondary | ICD-10-CM | POA: Diagnosis not present

## 2023-03-11 DIAGNOSIS — Z95828 Presence of other vascular implants and grafts: Secondary | ICD-10-CM

## 2023-03-11 DIAGNOSIS — E039 Hypothyroidism, unspecified: Secondary | ICD-10-CM | POA: Diagnosis not present

## 2023-03-11 DIAGNOSIS — I514 Myocarditis, unspecified: Secondary | ICD-10-CM

## 2023-03-11 DIAGNOSIS — C4371 Malignant melanoma of right lower limb, including hip: Secondary | ICD-10-CM

## 2023-03-11 DIAGNOSIS — Z5112 Encounter for antineoplastic immunotherapy: Secondary | ICD-10-CM | POA: Diagnosis not present

## 2023-03-11 DIAGNOSIS — R2241 Localized swelling, mass and lump, right lower limb: Secondary | ICD-10-CM | POA: Diagnosis not present

## 2023-03-11 DIAGNOSIS — Z2989 Encounter for other specified prophylactic measures: Secondary | ICD-10-CM | POA: Diagnosis not present

## 2023-03-11 DIAGNOSIS — Z79620 Long term (current) use of immunosuppressive biologic: Secondary | ICD-10-CM | POA: Diagnosis not present

## 2023-03-11 DIAGNOSIS — Z800 Family history of malignant neoplasm of digestive organs: Secondary | ICD-10-CM | POA: Diagnosis not present

## 2023-03-11 DIAGNOSIS — Z8679 Personal history of other diseases of the circulatory system: Secondary | ICD-10-CM | POA: Diagnosis not present

## 2023-03-11 DIAGNOSIS — M79651 Pain in right thigh: Secondary | ICD-10-CM | POA: Diagnosis not present

## 2023-03-11 DIAGNOSIS — Z803 Family history of malignant neoplasm of breast: Secondary | ICD-10-CM | POA: Diagnosis not present

## 2023-03-11 LAB — CBC WITH DIFFERENTIAL/PLATELET
Abs Immature Granulocytes: 0.02 K/uL (ref 0.00–0.07)
Basophils Absolute: 0 K/uL (ref 0.0–0.1)
Basophils Relative: 1 %
Eosinophils Absolute: 0.2 K/uL (ref 0.0–0.5)
Eosinophils Relative: 4 %
HCT: 38 % (ref 36.0–46.0)
Hemoglobin: 12 g/dL (ref 12.0–15.0)
Immature Granulocytes: 0 %
Lymphocytes Relative: 18 %
Lymphs Abs: 0.9 K/uL (ref 0.7–4.0)
MCH: 28.4 pg (ref 26.0–34.0)
MCHC: 31.6 g/dL (ref 30.0–36.0)
MCV: 89.8 fL (ref 80.0–100.0)
Monocytes Absolute: 0.4 K/uL (ref 0.1–1.0)
Monocytes Relative: 9 %
Neutro Abs: 3.4 K/uL (ref 1.7–7.7)
Neutrophils Relative %: 68 %
Platelets: 184 K/uL (ref 150–400)
RBC: 4.23 MIL/uL (ref 3.87–5.11)
RDW: 13.1 % (ref 11.5–15.5)
WBC: 5 K/uL (ref 4.0–10.5)
nRBC: 0 % (ref 0.0–0.2)

## 2023-03-11 LAB — COMPREHENSIVE METABOLIC PANEL
ALT: 43 U/L (ref 0–44)
AST: 50 U/L — ABNORMAL HIGH (ref 15–41)
Albumin: 3.3 g/dL — ABNORMAL LOW (ref 3.5–5.0)
Alkaline Phosphatase: 178 U/L — ABNORMAL HIGH (ref 38–126)
Anion gap: 10 (ref 5–15)
BUN: 12 mg/dL (ref 8–23)
CO2: 27 mmol/L (ref 22–32)
Calcium: 9 mg/dL (ref 8.9–10.3)
Chloride: 99 mmol/L (ref 98–111)
Creatinine, Ser: 0.8 mg/dL (ref 0.44–1.00)
GFR, Estimated: >60 mL/min (ref >60)
Glucose, Bld: 105 mg/dL — ABNORMAL HIGH (ref 70–99)
Potassium: 4.2 mmol/L (ref 3.5–5.1)
Sodium: 136 mmol/L (ref 135–145)
Total Bilirubin: 0.6 mg/dL (ref 0.3–1.2)
Total Protein: 6.6 g/dL (ref 6.5–8.1)

## 2023-03-11 LAB — TROPONIN I (HIGH SENSITIVITY): Troponin I (High Sensitivity): 11 ng/L

## 2023-03-11 LAB — MAGNESIUM: Magnesium: 2 mg/dL (ref 1.7–2.4)

## 2023-03-11 LAB — TSH: TSH: 0.119 u[IU]/mL — ABNORMAL LOW (ref 0.350–4.500)

## 2023-03-11 MED ORDER — SODIUM CHLORIDE 0.9 % IV SOLN
1.0000 mg/kg | Freq: Once | INTRAVENOUS | Status: AC
  Administered 2023-03-11: 80 mg via INTRAVENOUS
  Filled 2023-03-11: qty 16

## 2023-03-11 MED ORDER — SODIUM CHLORIDE 0.9% FLUSH
10.0000 mL | INTRAVENOUS | Status: DC | PRN
  Administered 2023-03-11: 10 mL

## 2023-03-11 MED ORDER — SODIUM CHLORIDE 0.9 % IV SOLN: Freq: Once | INTRAVENOUS | Status: AC

## 2023-03-11 MED ORDER — HEPARIN SOD (PORK) LOCK FLUSH 100 UNIT/ML IV SOLN
500.0000 [IU] | Freq: Once | INTRAVENOUS | Status: AC | PRN
Start: 2023-03-11 — End: 2023-03-11
  Administered 2023-03-11: 500 [IU]

## 2023-03-11 MED ORDER — CETIRIZINE HCL 10 MG/ML IV SOLN
5.0000 mg | Freq: Once | INTRAVENOUS | Status: AC
  Administered 2023-03-11: 5 mg via INTRAVENOUS
  Filled 2023-03-11: qty 1

## 2023-03-11 MED ORDER — SODIUM CHLORIDE 0.9 % IV SOLN
200.0000 mg | Freq: Once | INTRAVENOUS | Status: AC
Start: 2023-03-11 — End: 2023-03-11
  Administered 2023-03-11: 200 mg via INTRAVENOUS
  Filled 2023-03-11: qty 8

## 2023-03-11 MED ORDER — SODIUM CHLORIDE 0.9% FLUSH
10.0000 mL | Freq: Once | INTRAVENOUS | Status: AC
  Administered 2023-03-11: 10 mL via INTRAVENOUS

## 2023-03-11 MED ORDER — FAMOTIDINE IN NACL 20-0.9 MG/50ML-% IV SOLN
20.0000 mg | Freq: Once | INTRAVENOUS | Status: AC
  Administered 2023-03-11: 20 mg via INTRAVENOUS
  Filled 2023-03-11: qty 50

## 2023-03-11 NOTE — Progress Notes (Signed)
Patient okay for Treatment today per Dr. Ellin Saba.  Ipilimumab (YERVOY) Patient Monitoring Assessment   Is the patient experiencing any of the following general symptoms?:  [ ] Difficulty performing normal activities [ ] Feeling sluggish or cold all the time [ ] Unusual weight gain [ ] Constant or unusual headaches [ ] Feeling dizzy or faint [ ] Changes in eyesight (blurry vision, double vision, or other vision problems) [ ] Changes in mood or behavior (ex: decreased sex drive, irritability, or forgetfulness) [ ] Starting new medications (ex: steroids, other medications that lower immune response) [X ]Patient is not experiencing any of the general symptoms above.   Gastrointestinal  Patient is having 1-2 bowel movements each day.  Is this different from baseline? [ ] Yes [ X]No Are your stools watery or do they have a foul smell? [ ] Yes [ X]No Have you seen blood in your stools? [ ] Yes [X ]No Are your stools dark, tarry, or sticky? [ ] Yes [X ]No Are you having pain or tenderness in your belly? [ ] Yes [X ]No  Skin Does your skin itch? [ ] Yes [X ]No Do you have a rash? [ ] Yes [ X]No Has your skin blistered and/or peeled? [ ] Yes [X ]No Do you have sores in your mouth? [ ] Yes [X ]No  Hepatic Has your urine been dark or tea colored? [ ] Yes [ X]No Have you noticed that your skin or the whites of your eyes are turning yellow? [ ] Yes [X ]No Are you bleeding or bruising more easily than normal? [ ] Yes [X ]No Are you nauseous and/or vomiting? [ ] Yes [X] No Do you have pain on the right side of your stomach? [ ] Yes [X ]No  Neurologic  Are you having unusual weakness of legs, arms, or face? [ ] Yes [ X]No Are you having numbness or tingling in your hands or feet? [ ] Yes [X ]No  Patient tolerated chemotherapy with no complaints voiced. Side effects with management reviewed understanding verbalized. Port site clean and dry with no bruising or swelling noted at site. Good blood return noted before  and after administration of chemotherapy. Band aid applied. Patient left in satisfactory condition with VSS and no s/s of distress noted.  Barron Schmid

## 2023-03-11 NOTE — Progress Notes (Signed)
Patient has been examined by Dr. Katragadda. Vital signs and labs have been reviewed by MD - ANC, Creatinine, LFTs, hemoglobin, and platelets are within treatment parameters per M.D. - pt may proceed with treatment.  Primary RN and pharmacy notified.  

## 2023-03-11 NOTE — Progress Notes (Signed)
[facility] [address], [place] [address]    Clinic Day:  03/11/2023  Referring physician: [name], MD  Patient Care Team: [name], MD as PCP - General (Internal Medicine) [name], MD as Medical Oncologist (Medical Oncology) [name], RN as Oncology Nurse Navigator (Oncology)   ASSESSMENT & PLAN:   Assessment: 1. Malignant melanoma of right posterior leg (pT4 pN3 M1): - She noticed fleshy lesion 4 months ago on the right leg. - Her PMD did a biopsy which was consistent with melanoma. - Wide local excision and lymph node biopsy by Dr. Donell Beers on 02/12/2021. - Pathology margins free, nodular type, Breslow's thickness 9.52 mm, Clark level V, deep margins free, ulceration absent, satellitosis absent, mitotic index 2/millimeters squared, LVI negative.  Neurotropism absent.  Tumor infiltrating lymphocytes absent.  Tumor regression not noted.  3 out of 3 positive sentinel lymph nodes for melanoma. - PET CT scan on 03/22/2021 with 3 foci of intense radiotracer activity within the right lower extremity.  In the medial right upper thigh nodule just superficial to thigh musculature measuring 11 mm with SUV 7.6.  Intramedullary hypermetabolic activity within the shaft of the mid right femur with SUV 14.6.  Intense activity associated with condylar notch of the distal right femur, appears to localize to soft tissue rather than the bone.  More diffuse activity associated with the medial right calf related to excision biopsy.  There is a focus of activity adjacent to the lateral margin of the right iliac wing SUV 4.1.  No evidence of visceral metastatic disease in the chest, abdomen or pelvis. 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 - NGS testing-BRAF negative, positive for NRAS  pathogenic variant exon 3, TERT promoter.  MSI-stable.  MMR-proficient.  NTRK 1/2/3 fusion not detected.  KIT mutation negative.  PD-L1 negative. - Nivolumab-Relatlimab every 28 days started on 05/09/2021.  Held permanently after first dose due to myocarditis. - PET scan on 07/25/2021: Slight progression with no new sites of metastatic disease. - Pembrolizumab started on 09/03/2021.  XRT to the soft tissue nodule within the medial right thigh and midshaft right femoral lesion from 03/26/2022 through 04/09/2022. -We have discussed further options including adding a low-dose ipilimumab to pembrolizumab which she is already receiving without any side effects.  I have quoted response rates around 30% (J Clin Oncol. 2021 Aug 20;39(24):2647-2655. doi: 10.1200/JCO.21.00079).  We also discussed the possibility increased chance of immunotherapy related side effects with combination of PD-1 and anti-CTL A4 antibody. - We also discussed option of binimetinib as she has NRAS mutation on previous NGS testing.  However this option is also associated with significant side effects and overall response rate of around 20%. - We also discussed other options including adding lenvatinib to pembrolizumab, least favorable option due to lack of robust data.  We have discussed chemotherapy options with dacarbazine/temozolomide among others. - Pembrolizumab with low-dose ipilimumab started on 01/14/2023 - Guardant360 (12/2022): NRAS Q61R (?  Binimetinib), T p53 C17 6Y, TERT promoter SNV, TMB 20.8, MSI high not detected.   2. Social/family history: - She is seen with her son today.  She lives by herself at home.  She is independent of ADLs and IADLs.  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  She is non-smoker. -  Father had colon cancer.  Sister had breast cancer and colon cancer.  Maternal aunt had breast cancer.  Paternal uncle had colon cancer.    Plan: 1. Malignant melanoma of the  right posterior leg (pT4 pN3 M1), BRAF V600 negative: - Cycle 1 of pembrolizumab and low-dose dose ipilimumab on 01/14/2023.  On 02/19/2023 he received single agent Keytruda. - She did not have any heart related symptoms. - Reviewed labs today: AST is mildly elevated at 50 and alk phos 178.  Rest of the LFTs are normal.  CBC was grossly normal. - We will proceed with pembrolizumab and low-dose ipilimumab today.  We will check her troponin and CK levels in 2 weeks.  She will continue treatment every 3 weeks.  I will reevaluate her in 6 weeks.  2.  History of immunotherapy myocarditis: - Troponin increased to 33 after first treatment with ipilimumab and pembrolizumab.  Troponin was normal at 11 on 02/19/2023 when she received single agent Keytruda.  Today troponin is 11.  We will give her pembrolizumab and low-dose ipilimumab today.  I will check a troponin and CK levels in 2 weeks.  She was told to go to the ER should she develop any chest pain or related symptoms.  3.  Right medial thigh pain: - Small subcutaneous nodule in the right upper medial thigh is tender to palpation but is stable.  4.  Hypothyroidism: - She is taking Synthroid 137 mcg daily.  TSH today is down to 0.11 from 0.281 previously. - Will check free T3 and T4 levels.  Will dose adjust based on the levels.    Orders Placed This Encounter  Procedures   T3, free      I,Helena R Teague,acting as a scribe for [name], MD.,have documented all relevant documentation on the behalf of [name], MD,as directed by  [name], MD while in the presence of [name], MD.  I, [name] MD, have reviewed the above documentation for accuracy and completeness, and I agree with the above.     [name], MD   10/30/20242:52 PM  CHIEF COMPLAINT:   Diagnosis: metastatic malignant melanoma, BRAF V600 negative    Cancer Staging  Malignant melanoma of lower leg, right  (HCC) Staging form: Melanoma of the Skin, AJCC 8th Edition - Clinical stage from 03/27/2021: Stage IV (cT4a, cN3, cM1) - Unsigned    Prior Therapy: none  Current Therapy:  Pembrolizumab (200) q21d Keytruda    HISTORY OF PRESENT ILLNESS:   Oncology History  Malignant melanoma of lower leg, right (HCC)  03/27/2021 Initial Diagnosis   Malignant melanoma of lower leg, right (HCC)   05/09/2021 - 05/09/2021 Chemotherapy   Patient is on Treatment Plan : MELANOMA - Opdualag (nivolumab/relatlimab) q28d     09/03/2021 - 01/07/2022 Chemotherapy   Patient is on Treatment Plan : MELANOMA Pembrolizumab (200) q21d     09/03/2021 -  Chemotherapy   Patient is on Treatment Plan : MELANOMA Pembrolizumab (200) + Yervoy (1mg /kg) q21d        INTERVAL HISTORY:   Elizabeth Norman is a 82 y.o. female presenting to clinic today for follow up of metastatic malignant melanoma, BRAF V600 negative. She was last seen by me on 02/19/23.  Today, she states that she is doing well overall. Her appetite level is at 100%. Her energy level is at 75%.   PAST MEDICAL HISTORY:   Past Medical History: Past Medical History:  Diagnosis Date   Arthritis    Hypertension  Malignant melanoma of lower leg, right (HCC) 03/27/2021   Port-A-Cath in place 04/30/2021    Surgical History: Past Surgical History:  Procedure Laterality Date   CATARACT EXTRACTION W/PHACO Right 10/03/2014   Procedure: CATARACT EXTRACTION PHACO AND INTRAOCULAR LENS PLACEMENT (IOC);  Surgeon: Jethro Bolus, MD;  Location: [facility];  Service: Ophthalmology;  Laterality: Right;  CDE:10.09   CATARACT EXTRACTION W/PHACO Left 10/10/2014   Procedure: CATARACT EXTRACTION PHACO AND INTRAOCULAR LENS PLACEMENT (IOC);  Surgeon: Jethro Bolus, MD;  Location: [facility];  Service: Ophthalmology;  Laterality: Left;  CDE:8.69   CORNEAL 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   CORNEAL TRANSPLANT Left 2020   DENTAL RESTORATION/EXTRACTION WITH X-RAY     IR IMAGING GUIDED PORT INSERTION   04/05/2021   LEFT HEART CATH AND CORONARY ANGIOGRAPHY N/A 05/28/2021   Procedure: LEFT HEART CATH AND CORONARY ANGIOGRAPHY;  Surgeon: [name], MD;  Location: [facility];  Service: Cardiovascular;  Laterality: N/A;   MELANOMA EXCISION Right 02/12/2021   Procedure: WIDE LOCAL EXCISION RIGHT LOWER LEG MELANOMA , ADVANCEMENT FLAP CLOSURE DEFECT;  Surgeon: Almond Lint, MD;  Location: [facility];  Service: General;  Laterality: Right;   SENTINEL NODE BIOPSY Right 02/12/2021   Procedure: SENTINEL NODE BIOPSY;  Surgeon: Almond Lint, MD;  Location: [facility];  Service: General;  Laterality: Right;    Social History: Social History   Socioeconomic History   Marital status: Widowed    Spouse name: Not on file   Number of children: 3   Years of education: Not on file   Highest education level: Not on file  Occupational History   Not on file  Tobacco Use   Smoking status: Never   Smokeless tobacco: Never  Vaping Use   Vaping status: Never Used  Substance and Sexual Activity   Alcohol use: No   Drug use: No   Sexual activity: Not on file  Other Topics Concern   Not on file  Social History Narrative   Not on file   Social Determinants of Health   Financial Resource Strain: Not on file  Food Insecurity: Not on file  Transportation Needs: Not on file  Physical Activity: Not on file  Stress: Not on file  Social Connections: Not on file  Intimate Partner Violence: Not on file    Family History: Family History  Problem Relation Age of Onset   Cancer Father    Hypertension Sister    Hypertension Sister     Current Medications:  Current Outpatient Medications:    alendronate (FOSAMAX) 70 MG tablet, Take 70 mg by mouth once a week., Disp: , Rfl: 0   ALPRAZolam (XANAX) 0.5 MG tablet, Take 0.5 mg by mouth 3 (three) times daily., Disp: , Rfl: 2   Aspirin Buf,CaCarb-MgCarb-MgO, (BUFFERIN PO), Take 400 mg by mouth daily at 2 PM., Disp: , Rfl:    calcium carbonate (OS-CAL -  DOSED IN MG OF ELEMENTAL CALCIUM) 1250 (500 CA) MG tablet, Take 1 tablet by mouth 3 (three) times a week., Disp: , Rfl:    cholecalciferol (VITAMIN D) 1000 UNITS tablet, Take 1,000 Units by mouth 3 (three) times a week., Disp: , Rfl:    Cyanocobalamin (VITAMIN B-12 PO), Take 1 tablet by mouth 3 (three) times a week., Disp: , Rfl:    famotidine (PEPCID) 20 MG tablet, Take 20 mg by mouth daily., Disp: , Rfl:    levothyroxine (SYNTHROID) 137 MCG tablet, Take 1 tablet (137 mcg total) by mouth daily before breakfast., Disp: 30  tablet, Rfl: 5   metoprolol tartrate (LOPRESSOR) 25 MG tablet, Take 25 mg by mouth 2 (two) times daily., Disp: , Rfl:    omeprazole (PRILOSEC) 40 MG capsule, Take 40 mg by mouth daily., Disp: , Rfl:    prednisoLONE acetate (PRED FORTE) 1 % ophthalmic suspension, Place 1 drop into both eyes See admin instructions. Instill one drop into the right eye twice daily and one drop into the left eye once daily., Disp: , Rfl:  No current facility-administered medications for this visit.  Facility-Administered Medications Ordered in Other Visits:    heparin lock flush 100 unit/mL, 500 Units, Intracatheter, Once PRN, [name], MD   ipilimumab (YERVOY) 80 mg in sodium chloride 0.9 % 50 mL chemo infusion, 1 mg/kg (Treatment Plan Recorded), Intravenous, Once, [name], MD   pembrolizumab [facility]) 200 mg in sodium chloride 0.9 % 50 mL chemo infusion, 200 mg, Intravenous, Once, [name], MD, Last Rate: 116 mL/hr at 03/11/23 1431, 200 mg at 03/11/23 1431   sodium chloride flush (NS) 0.9 % injection 10 mL, 10 mL, Intracatheter, PRN, [name], MD   Allergies: Allergies  Allergen Reactions   Tape     Pulls skin, please use paper tape    REVIEW OF SYSTEMS:   Review of Systems  Constitutional:  Negative for chills, fatigue and fever.  HENT:   Negative for lump/mass, mouth sores, nosebleeds, sore throat and trouble swallowing.   Eyes:  Negative  for eye problems.  Respiratory:  Negative for cough and shortness of breath.   Cardiovascular:  Negative for chest pain, leg swelling and palpitations.  Gastrointestinal:  Negative for abdominal pain, constipation, diarrhea, nausea and vomiting.  Genitourinary:  Negative for bladder incontinence, difficulty urinating, dysuria, frequency, hematuria and nocturia.   Musculoskeletal:  Negative for arthralgias, back pain, flank pain, myalgias and neck pain.  Skin:  Negative for itching and rash.  Neurological:  Positive for headaches. Negative for dizziness and numbness.  Hematological:  Does not bruise/bleed easily.  Psychiatric/Behavioral:  Positive for sleep disturbance. Negative for depression and suicidal ideas. The patient is not nervous/anxious.   All other systems reviewed and are negative.    VITALS:   There were no vitals taken for this visit.  Wt Readings from Last 3 Encounters:  03/11/23 168 lb 1.6 oz (76.2 kg)  02/19/23 169 lb (76.7 kg)  02/11/23 170 lb 3.2 oz (77.2 kg)    There is no height or weight on file to calculate BMI.  Performance status (ECOG): 1 - Symptomatic but completely ambulatory  PHYSICAL EXAM:   Physical Exam Vitals and nursing note reviewed. Exam conducted with a chaperone present.  Constitutional:      Appearance: Normal appearance.  Cardiovascular:     Rate and Rhythm: Normal rate and regular rhythm.     Pulses: Normal pulses.     Heart sounds: Normal heart sounds.  Pulmonary:     Effort: Pulmonary effort is normal.     Breath sounds: Normal breath sounds.  Abdominal:     Palpations: Abdomen is soft. There is no hepatomegaly, splenomegaly or mass.     Tenderness: There is no abdominal tenderness.  Musculoskeletal:     Right lower leg: No edema.     Left lower leg: No edema.  Lymphadenopathy:     Cervical: No cervical adenopathy.     Right cervical: No superficial, deep or posterior cervical adenopathy.    Left cervical: No superficial, deep  or posterior cervical adenopathy.  Upper Body:     Right upper body: No supraclavicular or axillary adenopathy.     Left upper body: No supraclavicular or axillary adenopathy.  Neurological:     General: No focal deficit present.     Mental Status: She is alert and oriented to person, place, and time.  Psychiatric:        Mood and Affect: Mood normal.        Behavior: Behavior normal.     LABS:      Latest Ref Rng & Units 03/11/2023   11:47 AM 02/19/2023    8:31 AM 02/11/2023    7:39 AM  CBC  WBC 4.0 - 10.5 K/uL 5.0  5.1  5.0   Hemoglobin 12.0 - 15.0 g/dL 16.1  09.6  04.5   Hematocrit 36.0 - 46.0 % 38.0  36.5  39.0   Platelets 150 - 400 K/uL 184  146  168       Latest Ref Rng & Units 03/11/2023   11:47 AM 02/19/2023    8:31 AM 02/11/2023    7:39 AM  CMP  Glucose 70 - 99 mg/dL 409  811  914   BUN 8 - 23 mg/dL 12  11  11    Creatinine 0.44 - 1.00 mg/dL 7.82  9.56  2.13   Sodium 135 - 145 mmol/L 136  133  134   Potassium 3.5 - 5.1 mmol/L 4.2  3.7  3.7   Chloride 98 - 111 mmol/L 99  98  98   CO2 22 - 32 mmol/L 27  28  27    Calcium 8.9 - 10.3 mg/dL 9.0  8.5  8.4   Total Protein 6.5 - 8.1 g/dL 6.6  6.4  6.4   Total Bilirubin 0.3 - 1.2 mg/dL 0.6  0.8  0.5   Alkaline Phos 38 - 126 U/L 178  104  89   AST 15 - 41 U/L 50  19  22   ALT 0 - 44 U/L 43  20  13      No results found for: "CEA1", "CEA" / No results found for: "CEA1", "CEA" No results found for: "PSA1" No results found for: "YQM578" No results found for: "CAN125"  No results found for: "TOTALPROTELP", "ALBUMINELP", "A1GS", "A2GS", "BETS", "BETA2SER", "GAMS", "MSPIKE", "SPEI" No results found for: "TIBC", "FERRITIN", "IRONPCTSAT" Lab Results  Component Value Date   LDH 137 06/05/2022   LDH 144 05/13/2022   LDH 168 11/05/2021     STUDIES:   No results found.

## 2023-03-11 NOTE — Patient Instructions (Signed)

## 2023-03-11 NOTE — Patient Instructions (Signed)
[facility]  Discharge Instructions: Thank you for choosing [facility] to provide your oncology and hematology care.  If you have a lab appointment with the Cancer Center - please note that after April 8th, 2024, all labs will be drawn in the cancer center.  You do not have to check in or register with the main entrance as you have in the past but will complete your check-in in the cancer center.  Wear comfortable clothing and clothing appropriate for easy access to any Portacath or PICC line.   We strive to give you quality time with your provider. You may need to reschedule your appointment if you arrive late (15 or more minutes).  Arriving late affects you and other patients whose appointments are after yours.  Also, if you miss three or more appointments without notifying the office, you may be dismissed from the clinic at the provider's discretion.      For prescription refill requests, have your pharmacy contact our office and allow 72 hours for refills to be completed.    Today you received the following Keytruda and Yervoy, return as scheduled.   To help prevent nausea and vomiting after your treatment, we encourage you to take your nausea medication as directed.  BELOW ARE SYMPTOMS THAT SHOULD BE REPORTED IMMEDIATELY: *FEVER GREATER THAN 100.4 F (38 C) OR HIGHER *CHILLS OR SWEATING *NAUSEA AND VOMITING THAT IS NOT CONTROLLED WITH YOUR NAUSEA MEDICATION *UNUSUAL SHORTNESS OF BREATH *UNUSUAL BRUISING OR BLEEDING *URINARY PROBLEMS (pain or burning when urinating, or frequent urination) *BOWEL PROBLEMS (unusual diarrhea, constipation, pain near the anus) TENDERNESS IN MOUTH AND THROAT WITH OR WITHOUT PRESENCE OF ULCERS (sore throat, sores in mouth, or a toothache) UNUSUAL RASH, SWELLING OR PAIN  UNUSUAL VAGINAL DISCHARGE OR ITCHING   Items with * indicate a potential emergency and should be followed up as soon as possible or go to the Emergency  Department if any problems should occur.  Please show the CHEMOTHERAPY ALERT CARD or IMMUNOTHERAPY ALERT CARD at check-in to the Emergency Department and triage nurse.  Should you have questions after your visit or need to cancel or reschedule your appointment, please contact [facility] [phone]  and follow the prompts.  Office hours are 8:00 a.m. to 4:30 p.m. Monday - Friday. Please note that voicemails left after 4:00 p.m. may not be returned until the following business day.  We are closed weekends and major holidays. You have access to a nurse at all times for urgent questions. Please call the main number to the clinic [phone] and follow the prompts.  For any non-urgent questions, you may also contact your provider using MyChart. We now offer e-Visits for anyone 93 and older to request care online for non-urgent symptoms. For details visit [email or web address].   Also download the MyChart app! Go to the app store, search "MyChart", open the app, select [place], and log in with your MyChart username and password.

## 2023-03-12 LAB — T4: T4, Total: 12 ug/dL (ref 4.5–12.0)

## 2023-03-12 LAB — T3, FREE: T3, Free: 2.7 pg/mL (ref 2.0–4.4)

## 2023-03-17 DIAGNOSIS — M810 Age-related osteoporosis without current pathological fracture: Secondary | ICD-10-CM | POA: Diagnosis not present

## 2023-03-17 DIAGNOSIS — C439 Malignant melanoma of skin, unspecified: Secondary | ICD-10-CM | POA: Diagnosis not present

## 2023-03-17 DIAGNOSIS — N182 Chronic kidney disease, stage 2 (mild): Secondary | ICD-10-CM | POA: Diagnosis not present

## 2023-03-17 DIAGNOSIS — I10 Essential (primary) hypertension: Secondary | ICD-10-CM | POA: Diagnosis not present

## 2023-03-17 DIAGNOSIS — E7849 Other hyperlipidemia: Secondary | ICD-10-CM | POA: Diagnosis not present

## 2023-03-23 ENCOUNTER — Other Ambulatory Visit: Payer: Self-pay

## 2023-03-23 DIAGNOSIS — C4371 Malignant melanoma of right lower limb, including hip: Secondary | ICD-10-CM

## 2023-03-25 ENCOUNTER — Inpatient Hospital Stay: Payer: Medicare Other | Attending: Hematology

## 2023-03-25 DIAGNOSIS — C7951 Secondary malignant neoplasm of bone: Secondary | ICD-10-CM | POA: Insufficient documentation

## 2023-03-25 DIAGNOSIS — Z95828 Presence of other vascular implants and grafts: Secondary | ICD-10-CM

## 2023-03-25 DIAGNOSIS — I514 Myocarditis, unspecified: Secondary | ICD-10-CM

## 2023-03-25 DIAGNOSIS — Z79899 Other long term (current) drug therapy: Secondary | ICD-10-CM | POA: Diagnosis not present

## 2023-03-25 DIAGNOSIS — C4371 Malignant melanoma of right lower limb, including hip: Secondary | ICD-10-CM | POA: Insufficient documentation

## 2023-03-25 LAB — CBC WITH DIFFERENTIAL/PLATELET
Abs Immature Granulocytes: 0.03 10*3/uL (ref 0.00–0.07)
Basophils Absolute: 0 10*3/uL (ref 0.0–0.1)
Basophils Relative: 0 %
Eosinophils Absolute: 0.1 10*3/uL (ref 0.0–0.5)
Eosinophils Relative: 1 %
HCT: 36.9 % (ref 36.0–46.0)
Hemoglobin: 11.8 g/dL — ABNORMAL LOW (ref 12.0–15.0)
Immature Granulocytes: 0 %
Lymphocytes Relative: 13 %
Lymphs Abs: 0.9 10*3/uL (ref 0.7–4.0)
MCH: 28.9 pg (ref 26.0–34.0)
MCHC: 32 g/dL (ref 30.0–36.0)
MCV: 90.2 fL (ref 80.0–100.0)
Monocytes Absolute: 0.6 10*3/uL (ref 0.1–1.0)
Monocytes Relative: 9 %
Neutro Abs: 5.1 10*3/uL (ref 1.7–7.7)
Neutrophils Relative %: 77 %
Platelets: 176 10*3/uL (ref 150–400)
RBC: 4.09 MIL/uL (ref 3.87–5.11)
RDW: 13.2 % (ref 11.5–15.5)
WBC: 6.7 10*3/uL (ref 4.0–10.5)
nRBC: 0 % (ref 0.0–0.2)

## 2023-03-25 LAB — COMPREHENSIVE METABOLIC PANEL
ALT: 48 U/L — ABNORMAL HIGH (ref 0–44)
AST: 99 U/L — ABNORMAL HIGH (ref 15–41)
Albumin: 3.2 g/dL — ABNORMAL LOW (ref 3.5–5.0)
Alkaline Phosphatase: 264 U/L — ABNORMAL HIGH (ref 38–126)
Anion gap: 12 (ref 5–15)
BUN: 12 mg/dL (ref 8–23)
CO2: 24 mmol/L (ref 22–32)
Calcium: 8.6 mg/dL — ABNORMAL LOW (ref 8.9–10.3)
Chloride: 97 mmol/L — ABNORMAL LOW (ref 98–111)
Creatinine, Ser: 0.81 mg/dL (ref 0.44–1.00)
GFR, Estimated: >60 mL/min (ref >60)
Glucose, Bld: 132 mg/dL — ABNORMAL HIGH (ref 70–99)
Potassium: 3.4 mmol/L — ABNORMAL LOW (ref 3.5–5.1)
Sodium: 133 mmol/L — ABNORMAL LOW (ref 135–145)
Total Bilirubin: 1.1 mg/dL (ref <1.2)
Total Protein: 6.5 g/dL (ref 6.5–8.1)

## 2023-03-25 LAB — CK: Total CK: 139 U/L (ref 38–234)

## 2023-03-25 LAB — MAGNESIUM: Magnesium: 1.9 mg/dL (ref 1.7–2.4)

## 2023-03-25 LAB — TSH: TSH: 0.132 u[IU]/mL — ABNORMAL LOW (ref 0.350–4.500)

## 2023-03-25 LAB — TROPONIN I (HIGH SENSITIVITY): Troponin I (High Sensitivity): 11 ng/L (ref <18)

## 2023-03-25 MED ORDER — HEPARIN SOD (PORK) LOCK FLUSH 100 UNIT/ML IV SOLN
500.0000 [IU] | Freq: Once | INTRAVENOUS | Status: AC
  Administered 2023-03-25: 500 [IU] via INTRAVENOUS

## 2023-03-25 MED ORDER — SODIUM CHLORIDE 0.9% FLUSH
10.0000 mL | Freq: Once | INTRAVENOUS | Status: AC
  Administered 2023-03-25: 10 mL via INTRAVENOUS

## 2023-03-25 NOTE — Progress Notes (Signed)
Patients port flushed without difficulty. Good blood return noted with no bruising or swelling noted at site. Band aid applied. VSS with discharge and left in satisfactory condition with no s/s of distress noted. All follow ups as scheduled.  Elizabeth Norman [employer]

## 2023-03-26 LAB — T4: T4, Total: 12 ug/dL (ref 4.5–12.0)

## 2023-03-30 ENCOUNTER — Emergency Department (HOSPITAL_COMMUNITY): Payer: Medicare Other

## 2023-03-30 ENCOUNTER — Encounter (HOSPITAL_COMMUNITY): Payer: Self-pay | Admitting: Emergency Medicine

## 2023-03-30 ENCOUNTER — Inpatient Hospital Stay (HOSPITAL_COMMUNITY)
Admission: EM | Admit: 2023-03-30 | Discharge: 2023-04-03 | DRG: 871 | Disposition: A | Discharge status: Home / Self Care | Payer: Medicare Other | Attending: Internal Medicine | Admitting: Internal Medicine

## 2023-03-30 ENCOUNTER — Other Ambulatory Visit: Payer: Self-pay

## 2023-03-30 DIAGNOSIS — D649 Anemia, unspecified: Secondary | ICD-10-CM | POA: Diagnosis present

## 2023-03-30 DIAGNOSIS — R748 Abnormal levels of other serum enzymes: Secondary | ICD-10-CM

## 2023-03-30 DIAGNOSIS — A4151 Sepsis due to Escherichia coli [E. coli]: Principal | ICD-10-CM | POA: Diagnosis present

## 2023-03-30 DIAGNOSIS — D849 Immunodeficiency, unspecified: Secondary | ICD-10-CM | POA: Diagnosis present

## 2023-03-30 DIAGNOSIS — B9620 Unspecified Escherichia coli [E. coli] as the cause of diseases classified elsewhere: Secondary | ICD-10-CM | POA: Diagnosis present

## 2023-03-30 DIAGNOSIS — J9811 Atelectasis: Secondary | ICD-10-CM | POA: Diagnosis not present

## 2023-03-30 DIAGNOSIS — R161 Splenomegaly, not elsewhere classified: Secondary | ICD-10-CM | POA: Diagnosis not present

## 2023-03-30 DIAGNOSIS — E440 Moderate protein-calorie malnutrition: Secondary | ICD-10-CM | POA: Diagnosis present

## 2023-03-30 DIAGNOSIS — R7989 Other specified abnormal findings of blood chemistry: Secondary | ICD-10-CM

## 2023-03-30 DIAGNOSIS — N179 Acute kidney failure, unspecified: Secondary | ICD-10-CM

## 2023-03-30 DIAGNOSIS — N170 Acute kidney failure with tubular necrosis: Secondary | ICD-10-CM | POA: Diagnosis not present

## 2023-03-30 DIAGNOSIS — E669 Obesity, unspecified: Secondary | ICD-10-CM | POA: Diagnosis present

## 2023-03-30 DIAGNOSIS — A419 Sepsis, unspecified organism: Secondary | ICD-10-CM | POA: Diagnosis present

## 2023-03-30 DIAGNOSIS — I10 Essential (primary) hypertension: Secondary | ICD-10-CM | POA: Diagnosis not present

## 2023-03-30 DIAGNOSIS — Z8249 Family history of ischemic heart disease and other diseases of the circulatory system: Secondary | ICD-10-CM

## 2023-03-30 DIAGNOSIS — R609 Edema, unspecified: Secondary | ICD-10-CM | POA: Diagnosis not present

## 2023-03-30 DIAGNOSIS — Z23 Encounter for immunization: Secondary | ICD-10-CM | POA: Diagnosis not present

## 2023-03-30 DIAGNOSIS — T50905D Adverse effect of unspecified drugs, medicaments and biological substances, subsequent encounter: Secondary | ICD-10-CM | POA: Diagnosis not present

## 2023-03-30 DIAGNOSIS — R0609 Other forms of dyspnea: Secondary | ICD-10-CM | POA: Diagnosis not present

## 2023-03-30 DIAGNOSIS — R1011 Right upper quadrant pain: Secondary | ICD-10-CM | POA: Diagnosis not present

## 2023-03-30 DIAGNOSIS — K8070 Calculus of gallbladder and bile duct without cholecystitis without obstruction: Secondary | ICD-10-CM | POA: Diagnosis present

## 2023-03-30 DIAGNOSIS — K8033 Calculus of bile duct with acute cholangitis with obstruction: Secondary | ICD-10-CM

## 2023-03-30 DIAGNOSIS — R55 Syncope and collapse: Secondary | ICD-10-CM | POA: Diagnosis not present

## 2023-03-30 DIAGNOSIS — R579 Shock, unspecified: Secondary | ICD-10-CM | POA: Diagnosis not present

## 2023-03-30 DIAGNOSIS — Z6830 Body mass index (BMI) 30.0-30.9, adult: Secondary | ICD-10-CM

## 2023-03-30 DIAGNOSIS — E872 Acidosis, unspecified: Secondary | ICD-10-CM | POA: Diagnosis present

## 2023-03-30 DIAGNOSIS — Z6832 Body mass index (BMI) 32.0-32.9, adult: Secondary | ICD-10-CM

## 2023-03-30 DIAGNOSIS — Z0389 Encounter for observation for other suspected diseases and conditions ruled out: Secondary | ICD-10-CM | POA: Diagnosis not present

## 2023-03-30 DIAGNOSIS — C775 Secondary and unspecified malignant neoplasm of intrapelvic lymph nodes: Secondary | ICD-10-CM | POA: Diagnosis not present

## 2023-03-30 DIAGNOSIS — Z9221 Personal history of antineoplastic chemotherapy: Secondary | ICD-10-CM

## 2023-03-30 DIAGNOSIS — E039 Hypothyroidism, unspecified: Secondary | ICD-10-CM | POA: Diagnosis present

## 2023-03-30 DIAGNOSIS — K2940 Chronic atrophic gastritis without bleeding: Secondary | ICD-10-CM | POA: Diagnosis present

## 2023-03-30 DIAGNOSIS — Z79899 Other long term (current) drug therapy: Secondary | ICD-10-CM

## 2023-03-30 DIAGNOSIS — D696 Thrombocytopenia, unspecified: Secondary | ICD-10-CM | POA: Diagnosis present

## 2023-03-30 DIAGNOSIS — K260 Acute duodenal ulcer with hemorrhage: Secondary | ICD-10-CM

## 2023-03-30 DIAGNOSIS — I514 Myocarditis, unspecified: Secondary | ICD-10-CM | POA: Diagnosis present

## 2023-03-30 DIAGNOSIS — E871 Hypo-osmolality and hyponatremia: Secondary | ICD-10-CM | POA: Diagnosis not present

## 2023-03-30 DIAGNOSIS — C7951 Secondary malignant neoplasm of bone: Secondary | ICD-10-CM | POA: Diagnosis present

## 2023-03-30 DIAGNOSIS — R932 Abnormal findings on diagnostic imaging of liver and biliary tract: Secondary | ICD-10-CM | POA: Diagnosis not present

## 2023-03-30 DIAGNOSIS — Z1152 Encounter for screening for COVID-19: Secondary | ICD-10-CM | POA: Diagnosis not present

## 2023-03-30 DIAGNOSIS — Z79890 Hormone replacement therapy (postmenopausal): Secondary | ICD-10-CM

## 2023-03-30 DIAGNOSIS — R0602 Shortness of breath: Secondary | ICD-10-CM | POA: Diagnosis not present

## 2023-03-30 DIAGNOSIS — I2489 Other forms of acute ischemic heart disease: Secondary | ICD-10-CM | POA: Diagnosis not present

## 2023-03-30 DIAGNOSIS — R7401 Elevation of levels of liver transaminase levels: Secondary | ICD-10-CM | POA: Diagnosis not present

## 2023-03-30 DIAGNOSIS — K2950 Unspecified chronic gastritis without bleeding: Secondary | ICD-10-CM | POA: Diagnosis not present

## 2023-03-30 DIAGNOSIS — Z947 Corneal transplant status: Secondary | ICD-10-CM

## 2023-03-30 DIAGNOSIS — K8030 Calculus of bile duct with cholangitis, unspecified, without obstruction: Secondary | ICD-10-CM | POA: Diagnosis not present

## 2023-03-30 DIAGNOSIS — R6521 Severe sepsis with septic shock: Secondary | ICD-10-CM | POA: Diagnosis present

## 2023-03-30 DIAGNOSIS — E876 Hypokalemia: Secondary | ICD-10-CM | POA: Diagnosis not present

## 2023-03-30 DIAGNOSIS — K5730 Diverticulosis of large intestine without perforation or abscess without bleeding: Secondary | ICD-10-CM | POA: Diagnosis not present

## 2023-03-30 DIAGNOSIS — R7881 Bacteremia: Secondary | ICD-10-CM | POA: Diagnosis present

## 2023-03-30 DIAGNOSIS — K8020 Calculus of gallbladder without cholecystitis without obstruction: Secondary | ICD-10-CM | POA: Diagnosis not present

## 2023-03-30 DIAGNOSIS — R9089 Other abnormal findings on diagnostic imaging of central nervous system: Secondary | ICD-10-CM | POA: Diagnosis not present

## 2023-03-30 DIAGNOSIS — Z91048 Other nonmedicinal substance allergy status: Secondary | ICD-10-CM

## 2023-03-30 DIAGNOSIS — Z01810 Encounter for preprocedural cardiovascular examination: Secondary | ICD-10-CM | POA: Diagnosis not present

## 2023-03-30 DIAGNOSIS — I2510 Atherosclerotic heart disease of native coronary artery without angina pectoris: Secondary | ICD-10-CM | POA: Diagnosis present

## 2023-03-30 DIAGNOSIS — C4371 Malignant melanoma of right lower limb, including hip: Secondary | ICD-10-CM | POA: Diagnosis present

## 2023-03-30 DIAGNOSIS — K3189 Other diseases of stomach and duodenum: Secondary | ICD-10-CM | POA: Diagnosis not present

## 2023-03-30 DIAGNOSIS — K8050 Calculus of bile duct without cholangitis or cholecystitis without obstruction: Secondary | ICD-10-CM | POA: Diagnosis not present

## 2023-03-30 DIAGNOSIS — R933 Abnormal findings on diagnostic imaging of other parts of digestive tract: Secondary | ICD-10-CM

## 2023-03-30 DIAGNOSIS — K838 Other specified diseases of biliary tract: Secondary | ICD-10-CM | POA: Diagnosis not present

## 2023-03-30 LAB — CBC WITH DIFFERENTIAL/PLATELET
Abs Immature Granulocytes: 3.7 10*3/uL — ABNORMAL HIGH (ref 0.00–0.07)
Band Neutrophils: 17 %
Basophils Absolute: 0.2 10*3/uL — ABNORMAL HIGH (ref 0.0–0.1)
Basophils Relative: 1 %
Eosinophils Absolute: 0 10*3/uL (ref 0.0–0.5)
Eosinophils Relative: 0 %
HCT: 36.6 % (ref 36.0–46.0)
HCT: 32 % — ABNORMAL LOW (ref 36.0–46.0)
Hemoglobin: 11.4 g/dL — ABNORMAL LOW (ref 12.0–15.0)
Hemoglobin: 10.2 g/dL — ABNORMAL LOW (ref 12.0–15.0)
Lymphocytes Relative: 4 %
Lymphocytes Relative: 1 %
Lymphs Abs: 0.9 10*3/uL (ref 0.7–4.0)
Lymphs Abs: 0.2 10*3/uL — ABNORMAL LOW (ref 0.7–4.0)
MCH: 28.6 pg (ref 26.0–34.0)
MCH: 28.2 pg (ref 26.0–34.0)
MCHC: 31.1 g/dL (ref 30.0–36.0)
MCHC: 31.9 g/dL (ref 30.0–36.0)
MCV: 92 fL (ref 80.0–100.0)
MCV: 88.4 fL (ref 80.0–100.0)
Metamyelocytes Relative: 17 %
Monocytes Absolute: 0.4 10*3/uL (ref 0.1–1.0)
Monocytes Relative: 2 %
Neutro Abs: 16.4 10*3/uL — ABNORMAL HIGH (ref 1.7–7.7)
Neutro Abs: 18.6 10*3/uL — ABNORMAL HIGH (ref 1.7–7.7)
Neutrophils Relative %: 59 %
Neutrophils Relative %: 99 %
Platelets: 160 10*3/uL (ref 150–400)
Platelets: 138 10*3/uL — ABNORMAL LOW (ref 150–400)
RBC: 3.98 MIL/uL (ref 3.87–5.11)
RBC: 3.62 MIL/uL — ABNORMAL LOW (ref 3.87–5.11)
RDW: 13.7 % (ref 11.5–15.5)
RDW: 13.8 % (ref 11.5–15.5)
WBC Morphology: INCREASED
WBC: 21.6 10*3/uL — ABNORMAL HIGH (ref 4.0–10.5)
WBC: 18.8 10*3/uL — ABNORMAL HIGH (ref 4.0–10.5)
nRBC: 0 % (ref 0.0–0.2)
nRBC: 0 % (ref 0.0–0.2)

## 2023-03-30 LAB — LACTATE DEHYDROGENASE: LDH: 348 U/L — ABNORMAL HIGH (ref 98–192)

## 2023-03-30 LAB — GLUCOSE, CAPILLARY
Glucose-Capillary: 108 mg/dL — ABNORMAL HIGH (ref 70–99)
Glucose-Capillary: 174 mg/dL — ABNORMAL HIGH (ref 70–99)
Glucose-Capillary: 56 mg/dL — ABNORMAL LOW (ref 70–99)
Glucose-Capillary: 62 mg/dL — ABNORMAL LOW (ref 70–99)

## 2023-03-30 LAB — PROTIME-INR
INR: 1.3 — ABNORMAL HIGH (ref 0.8–1.2)
Prothrombin Time: 16.3 s — ABNORMAL HIGH (ref 11.4–15.2)

## 2023-03-30 LAB — LACTIC ACID, PLASMA
Lactic Acid, Venous: 7.5 mmol/L (ref 0.5–1.9)
Lactic Acid, Venous: 6.1 mmol/L (ref 0.5–1.9)
Lactic Acid, Venous: 3.1 mmol/L (ref 0.5–1.9)

## 2023-03-30 LAB — PHOSPHORUS: Phosphorus: 3 mg/dL (ref 2.5–4.6)

## 2023-03-30 LAB — COMPREHENSIVE METABOLIC PANEL
ALT: 77 U/L — ABNORMAL HIGH (ref 0–44)
ALT: 93 U/L — ABNORMAL HIGH (ref 0–44)
AST: 136 U/L — ABNORMAL HIGH (ref 15–41)
AST: 196 U/L — ABNORMAL HIGH (ref 15–41)
Albumin: 2.7 g/dL — ABNORMAL LOW (ref 3.5–5.0)
Albumin: 2.1 g/dL — ABNORMAL LOW (ref 3.5–5.0)
Alkaline Phosphatase: 282 U/L — ABNORMAL HIGH (ref 38–126)
Alkaline Phosphatase: 221 U/L — ABNORMAL HIGH (ref 38–126)
Anion gap: 19 — ABNORMAL HIGH (ref 5–15)
Anion gap: 11 (ref 5–15)
BUN: 17 mg/dL (ref 8–23)
BUN: 18 mg/dL (ref 8–23)
CO2: 18 mmol/L — ABNORMAL LOW (ref 22–32)
CO2: 24 mmol/L (ref 22–32)
Calcium: 8.5 mg/dL — ABNORMAL LOW (ref 8.9–10.3)
Calcium: 8.2 mg/dL — ABNORMAL LOW (ref 8.9–10.3)
Chloride: 95 mmol/L — ABNORMAL LOW (ref 98–111)
Chloride: 95 mmol/L — ABNORMAL LOW (ref 98–111)
Creatinine, Ser: 1.62 mg/dL — ABNORMAL HIGH (ref 0.44–1.00)
Creatinine, Ser: 1.36 mg/dL — ABNORMAL HIGH (ref 0.44–1.00)
GFR, Estimated: 32 mL/min — ABNORMAL LOW (ref >60)
GFR, Estimated: 39 mL/min — ABNORMAL LOW (ref >60)
Glucose, Bld: 78 mg/dL (ref 70–99)
Glucose, Bld: 274 mg/dL — ABNORMAL HIGH (ref 70–99)
Potassium: 3.1 mmol/L — ABNORMAL LOW (ref 3.5–5.1)
Potassium: 3.8 mmol/L (ref 3.5–5.1)
Sodium: 132 mmol/L — ABNORMAL LOW (ref 135–145)
Sodium: 130 mmol/L — ABNORMAL LOW (ref 135–145)
Total Bilirubin: 6 mg/dL — ABNORMAL HIGH (ref <1.2)
Total Bilirubin: 4.5 mg/dL — ABNORMAL HIGH (ref <1.2)
Total Protein: 6.2 g/dL — ABNORMAL LOW (ref 6.5–8.1)
Total Protein: 5.1 g/dL — ABNORMAL LOW (ref 6.5–8.1)

## 2023-03-30 LAB — TROPONIN I (HIGH SENSITIVITY)
Troponin I (High Sensitivity): 46 ng/L — ABNORMAL HIGH (ref <18)
Troponin I (High Sensitivity): 36 ng/L — ABNORMAL HIGH (ref <18)

## 2023-03-30 LAB — T4, FREE: Free T4: 1.49 ng/dL — ABNORMAL HIGH (ref 0.61–1.12)

## 2023-03-30 LAB — TYPE AND SCREEN
ABO/RH(D): B POS
Antibody Screen: NEGATIVE

## 2023-03-30 LAB — RESP PANEL BY RT-PCR (RSV, FLU A&B, COVID)  RVPGX2
Influenza A by PCR: NEGATIVE
Influenza B by PCR: NEGATIVE
Resp Syncytial Virus by PCR: NEGATIVE
SARS Coronavirus 2 by RT PCR: NEGATIVE

## 2023-03-30 LAB — TSH: TSH: 0.173 u[IU]/mL — ABNORMAL LOW (ref 0.350–4.500)

## 2023-03-30 LAB — BRAIN NATRIURETIC PEPTIDE: B Natriuretic Peptide: 1106 pg/mL — ABNORMAL HIGH (ref 0.0–100.0)

## 2023-03-30 LAB — MAGNESIUM: Magnesium: 1.6 mg/dL — ABNORMAL LOW (ref 1.7–2.4)

## 2023-03-30 MED ORDER — DEXTROSE 50 % IV SOLN
12.5000 g | INTRAVENOUS | Status: AC
  Administered 2023-03-30: 12.5 g via INTRAVENOUS
  Filled 2023-03-30: qty 50

## 2023-03-30 MED ORDER — POLYETHYLENE GLYCOL 3350 17 G PO PACK: 17.0000 g | PACK | Freq: Every day | ORAL | Status: DC | PRN

## 2023-03-30 MED ORDER — SODIUM CHLORIDE 0.9 % IV SOLN
2.0000 g | Freq: Once | INTRAVENOUS | Status: AC
  Administered 2023-03-30: 2 g via INTRAVENOUS
  Filled 2023-03-30: qty 12.5

## 2023-03-30 MED ORDER — POTASSIUM CHLORIDE CRYS ER 20 MEQ PO TBCR
40.0000 meq | EXTENDED_RELEASE_TABLET | Freq: Two times a day (BID) | ORAL | Status: AC
  Administered 2023-03-30 – 2023-03-31 (×2): 40 meq via ORAL
  Filled 2023-03-30 (×2): qty 2

## 2023-03-30 MED ORDER — DEXTROSE 50 % IV SOLN
12.5000 g | INTRAVENOUS | Status: AC
  Administered 2023-03-30: 12.5 g via INTRAVENOUS

## 2023-03-30 MED ORDER — SODIUM CHLORIDE 0.9 % IV SOLN: 2.0000 g | INTRAVENOUS | Status: DC

## 2023-03-30 MED ORDER — NOREPINEPHRINE 4 MG/250ML-% IV SOLN
0.0000 ug/min | INTRAVENOUS | Status: DC
  Administered 2023-03-30: 2 ug/min via INTRAVENOUS
  Filled 2023-03-30: qty 250

## 2023-03-30 MED ORDER — VANCOMYCIN HCL IN DEXTROSE 1-5 GM/200ML-% IV SOLN: 1000.0000 mg | INTRAVENOUS | Status: DC

## 2023-03-30 MED ORDER — DEXTROSE 50 % IV SOLN
INTRAVENOUS | Status: AC
  Filled 2023-03-30: qty 50

## 2023-03-30 MED ORDER — VANCOMYCIN HCL 1500 MG/300ML IV SOLN
1500.0000 mg | Freq: Once | INTRAVENOUS | Status: AC
  Administered 2023-03-30: 1500 mg via INTRAVENOUS
  Filled 2023-03-30: qty 300

## 2023-03-30 MED ORDER — VANCOMYCIN HCL IN DEXTROSE 1-5 GM/200ML-% IV SOLN
1000.0000 mg | Freq: Once | INTRAVENOUS | Status: DC
  Filled 2023-03-30: qty 200

## 2023-03-30 MED ORDER — HEPARIN SODIUM (PORCINE) 5000 UNIT/ML IJ SOLN
5000.0000 [IU] | Freq: Three times a day (TID) | INTRAMUSCULAR | Status: DC
  Administered 2023-03-31 – 2023-04-03 (×8): 5000 [IU] via SUBCUTANEOUS
  Filled 2023-03-30 (×8): qty 1

## 2023-03-30 MED ORDER — METRONIDAZOLE 500 MG/100ML IV SOLN
500.0000 mg | Freq: Once | INTRAVENOUS | Status: DC
  Filled 2023-03-30: qty 100

## 2023-03-30 MED ORDER — METRONIDAZOLE 500 MG/100ML IV SOLN
500.0000 mg | Freq: Two times a day (BID) | INTRAVENOUS | Status: DC
  Administered 2023-03-30 – 2023-04-01 (×4): 500 mg via INTRAVENOUS
  Filled 2023-03-30 (×4): qty 100

## 2023-03-30 MED ORDER — LACTATED RINGERS IV BOLUS (SEPSIS)
1000.0000 mL | Freq: Once | INTRAVENOUS | Status: AC
  Administered 2023-03-30: 1000 mL via INTRAVENOUS

## 2023-03-30 MED ORDER — ORAL CARE MOUTH RINSE: 15.0000 mL | OROMUCOSAL | Status: DC | PRN

## 2023-03-30 MED ORDER — DOCUSATE SODIUM 100 MG PO CAPS: 100.0000 mg | ORAL_CAPSULE | Freq: Two times a day (BID) | ORAL | Status: DC | PRN

## 2023-03-30 MED ORDER — CHLORHEXIDINE GLUCONATE CLOTH 2 % EX PADS
6.0000 | MEDICATED_PAD | Freq: Every day | CUTANEOUS | Status: DC
  Administered 2023-04-01 – 2023-04-03 (×3): 6 via TOPICAL

## 2023-03-30 MED ORDER — LACTATED RINGERS IV SOLN: INTRAVENOUS | Status: AC

## 2023-03-30 MED ORDER — LACTATED RINGERS IV BOLUS (SEPSIS)
1300.0000 mL | Freq: Once | INTRAVENOUS | Status: AC
  Administered 2023-03-30: 1300 mL via INTRAVENOUS

## 2023-03-30 NOTE — Progress Notes (Signed)
eLink Physician-Brief Progress Note Patient Name: Elizabeth Norman DOB: 1940/12/28 MRN: [id number]   Date of Service  03/30/2023  HPI/Events of Note  82 year old with a history of malignant melanoma, immunotherapy associated myocarditis, coronary artery disease who initially presented acute kidney injury, anion gap metabolic acidosis, leukocytosis and lactic acidosis.  She is admitted with septic shock of unclear etiology in the setting of an immunocompromised host.  On examination, patient is mildly tachypneic, tachycardic, and otherwise normal vitals with 100% saturation on room air.  Requiring low-dose norepinephrine at 3 mcg.  Laboratory studies consistent with mild electrolyte disturbances, elevated creatinine, transaminitis, elevated BNP and lactate.  Leukocytosis present.  CT chest/abdomen/pelvis with evidence of progressive lymphadenopathy  eICU Interventions  Maintain broad-spectrum antibiotics, cultures pending  Maintain norepinephrine as needed for MAP greater than 65.  DVT prophylaxis with heparin subcutaneous GI prophylaxis not indicated.  Uses home PPI.   0058 -resume home alprazolam.  Dose reduced with renal function.  Intervention Category Evaluation Type: New Patient Evaluation  [name] 03/30/2023, 10:49 PM

## 2023-03-30 NOTE — ED Provider Notes (Signed)
Elizabeth Norman AT Elizabeth Norman Provider Note   CSN: [id number] Arrival date & time: 03/30/23  1137     History  Chief Complaint  Patient presents with   Shortness of Breath    Elizabeth Norman is a 82 y.o. female.   Shortness of Breath Patient presents for dizziness, shortness of breath and chills.  Medical history includes malignant melanoma, CAD, HTN.  Home medications include Synthroid, metoprolol.  She did not take her medications this morning.  She lives independently.  She had chills yesterday.  This morning, she had dizziness and lightheadedness.  She denies any recent blood loss.  She has a low appetite at baseline.  She is currently on every 3 week transfusions for her melanoma.  Last chemotherapy was a week ago.     Home Medications Prior to Admission medications   Medication Sig Start Date End Date Taking? Authorizing Provider  ALPRAZolam Prudy Feeler) 0.5 MG tablet Take 0.5 mg by mouth 3 (three) times daily. 08/22/14  Yes [provider]  calcium carbonate (OS-CAL - DOSED IN MG OF ELEMENTAL CALCIUM) 1250 (500 CA) MG tablet Take 1 tablet by mouth 3 (three) times a week.   Yes [provider]  cholecalciferol (VITAMIN D) 1000 UNITS tablet Take 1,000 Units by mouth 3 (three) times a week.   Yes [provider]  Cyanocobalamin (VITAMIN B-12 PO) Take 1 tablet by mouth once a week.   Yes [provider]  famotidine (PEPCID) 40 MG tablet Take 40 mg by mouth daily. 03/13/23  Yes [provider]  levothyroxine (SYNTHROID) 137 MCG tablet Take 1 tablet (137 mcg total) by mouth daily before breakfast. 12/17/22  Yes [name], MD  metoprolol tartrate (LOPRESSOR) 25 MG tablet Take 25 mg by mouth 2 (two) times daily. 03/28/22  Yes [provider]  omeprazole (PRILOSEC) 40 MG capsule Take 40 mg by mouth daily. 12/08/22  Yes [provider]  prednisoLONE acetate (PRED FORTE) 1 % ophthalmic suspension  Place 1 drop into both eyes daily. Instill one drop into the right eye twice daily and one drop into the left eye once daily. 12/18/20  Yes [provider]      Allergies    Tape    Review of Systems   Review of Systems  Constitutional:  Positive for chills and fatigue.  Respiratory:  Positive for shortness of breath.   Neurological:  Positive for dizziness and light-headedness.  All other systems reviewed and are negative.   Physical Exam Updated Vital Signs BP (!) 101/50   Pulse 93   Temp 97.6 F (36.4 C) (Oral)   Resp (!) 24   Ht 5' 2.25" (1.581 m)   Wt 76.2 kg   SpO2 95%   BMI 30.48 kg/m  Physical Exam Vitals and nursing note reviewed.  Constitutional:      General: She is not in acute distress.    Appearance: She is well-developed. She is not ill-appearing, toxic-appearing or diaphoretic.  HENT:     Head: Normocephalic and atraumatic.     Mouth/Throat:     Mouth: Mucous membranes are moist.  Eyes:     Conjunctiva/sclera: Conjunctivae normal.  Cardiovascular:     Rate and Rhythm: Normal rate and regular rhythm.     Heart sounds: No murmur heard. Pulmonary:     Effort: Pulmonary effort is normal. No tachypnea or respiratory distress.     Breath sounds: Normal breath sounds. No decreased breath sounds, wheezing, rhonchi or  rales.  Chest:     Chest wall: No tenderness.  Abdominal:     Palpations: Abdomen is soft.     Tenderness: There is no abdominal tenderness.  Musculoskeletal:        General: No swelling. Normal range of motion.     Cervical back: Neck supple.  Skin:    General: Skin is warm and dry.     Coloration: Skin is not cyanotic or pale.  Neurological:     General: No focal deficit present.     Mental Status: She is alert and oriented to person, place, and time.  Psychiatric:        Mood and Affect: Mood normal.        Behavior: Behavior normal.     ED Results / Procedures / Treatments   Labs (all labs ordered are listed, but only  abnormal results are displayed) Labs Reviewed  LACTIC ACID, PLASMA - Abnormal; Notable for the following components:      Result Value   Lactic Acid, Venous 7.5 (*)    All other components within normal limits  LACTIC ACID, PLASMA - Abnormal; Notable for the following components:   Lactic Acid, Venous 6.1 (*)    All other components within normal limits  COMPREHENSIVE METABOLIC PANEL - Abnormal; Notable for the following components:   Sodium 132 (*)    Potassium 3.1 (*)    Chloride 95 (*)    CO2 18 (*)    Creatinine, Ser 1.62 (*)    Calcium 8.5 (*)    Total Protein 6.2 (*)    Albumin 2.7 (*)    AST 136 (*)    ALT 77 (*)    Alkaline Phosphatase 282 (*)    Total Bilirubin 6.0 (*)    GFR, Estimated 32 (*)    Anion gap 19 (*)    All other components within normal limits  CBC WITH DIFFERENTIAL/PLATELET - Abnormal; Notable for the following components:   WBC 21.6 (*)    Hemoglobin 11.4 (*)    Neutro Abs 16.4 (*)    Basophils Absolute 0.2 (*)    Abs Immature Granulocytes 3.70 (*)    All other components within normal limits  PROTIME-INR - Abnormal; Notable for the following components:   Prothrombin Time 16.3 (*)    INR 1.3 (*)    All other components within normal limits  BRAIN NATRIURETIC PEPTIDE - Abnormal; Notable for the following components:   B Natriuretic Peptide 1,106.0 (*)    All other components within normal limits  TROPONIN I (HIGH SENSITIVITY) - Abnormal; Notable for the following components:   Troponin I (High Sensitivity) 46 (*)    All other components within normal limits  TROPONIN I (HIGH SENSITIVITY) - Abnormal; Notable for the following components:   Troponin I (High Sensitivity) 36 (*)    All other components within normal limits  CULTURE, BLOOD (ROUTINE X 2)  CULTURE, BLOOD (ROUTINE X 2)  RESP PANEL BY RT-PCR (RSV, FLU A&B, COVID)  RVPGX2  URINALYSIS, W/ REFLEX TO CULTURE (INFECTION SUSPECTED)  I-STAT CHEM 8, ED    EKG EKG  Interpretation Date/Time:  Monday March 30 2023 12:16:42 EST Ventricular Rate:  98 PR Interval:  152 QRS Duration:  82 QT Interval:  380 QTC Calculation: 485 R Axis:   1  Text Interpretation: Normal sinus rhythm with sinus arrhythmia Anterior infarct (cited on or before 11-Feb-2023) Abnormal ECG Confirmed by Elizabeth Norman ([id number]) on 03/30/2023 12:24:56 PM  Radiology No results found.  Procedures Procedures    Medications Ordered in ED Medications  norepinephrine (LEVOPHED) 4mg  in (0.016 mg/mL) premix infusion (2 mcg/min Intravenous New Bag/Given 03/30/23 1424)  vancomycin (VANCOREADY) IVPB 1500 mg/300 mL (has no administration in time range)  ceFEPIme (MAXIPIME) 2 g in sodium chloride 0.9 % 100 mL IVPB (has no administration in time range)  vancomycin (VANCOCIN) IVPB 1000 mg/200 mL premix (has no administration in time range)  metroNIDAZOLE (FLAGYL) IVPB 500 mg (has no administration in time range)  lactated ringers bolus 1,000 mL (0 mLs Intravenous Stopped 03/30/23 1414)  ceFEPIme (MAXIPIME) 2 g in sodium chloride 0.9 % 100 mL IVPB (0 g Intravenous Stopped 03/30/23 1458)    ED Course/ Medical Decision Making/ A&P                                 Medical Decision Making Amount and/or Complexity of Data Reviewed Labs: ordered. Radiology: ordered.  Risk Prescription drug management.   This patient presents to the ED for concern of chills and generalized weakness, this involves an extensive number of treatment options, and is a complaint that carries with it a high risk of complications and morbidity.  The differential diagnosis includes infection, dehydration, polypharmacy, CHF, worsening of neoplasm   Co morbidities that complicate the patient evaluation  malignant melanoma, CAD, HTN   Additional history obtained:  Additional history obtained from patient's son External records from outside source obtained and reviewed including EMR   Lab Tests:  I  Ordered, and personally interpreted labs.  The pertinent results include: Leukocytosis is present.  Anemia is baseline.  AKI is present in addition to acute elevation in [employer].  BNP and troponin are elevated concerning for CHF.  Lactate is markedly elevated but mildly improved on repeat.   Imaging Studies ordered:  I ordered imaging studies including chest x-ray, CT of head, chest, abdomen, pelvis; right upper quadrant ultrasound I independently visualized and interpreted imaging which showed (pending at time of signout) I agree with the radiologist interpretation   Cardiac Monitoring: / EKG:  The patient was maintained on a cardiac monitor.  I personally viewed and interpreted the cardiac monitored which showed an underlying rhythm of: Sinus rhythm   Problem List / ED Course / Critical interventions / Medication management  Patient presents for recent chills, dizziness, and lightheadedness.  Blood pressure in triage was 64/38.  Patient was brought back to ED room.  When checking blood pressure on left arm, BP is normal.  When checking on right, there is a significant drop, now in 70s SBP.  This is an approximate 50 point discrepancy.  This does raise concern for aortic syndrome.  Patient states that she has been told that she has abnormal blood pressure on her right arm.  She states that it is always checked on her left arm.  She currently has improved dizziness while at rest.  She has no focal neurologic deficits.  IV fluids were ordered.  Workup was initiated.  Blood pressure cuff remained on right arm and continued to show hypotension.  When switching blood pressure cuff back to left arm, patient is now hypotensive in both arms.  At this point, lab work was coming back.  BNP, lactate, and creatinine are elevated.  A leukocytosis is present.  Patient was started on Levophed.  She was treated with broad-spectrum empiric antibiotics given her hypotension and leukocytosis.  Also of note,  transaminases and  bilirubin are elevated.  Bilirubin appears to be acutely elevated.  Right upper quadrant ultrasound was ordered.  Given her AKI, noncontrasted CT imaging was ordered.  Patient's blood pressure improved on 3 mcg/min of Levophed.  Imaging studies pending at time of signout.  Care of patient was signed out to oncoming ED provider. I ordered medication including IV fluids and broad-spectrum antibiotics for empiric treatment of sepsis; Levophed for hypotension Reevaluation of the patient after these medicines showed that the patient improved I have reviewed the patients home medicines and have made adjustments as needed   Social Determinants of Health:  Lives independently  CRITICAL CARE Performed by: Elizabeth Norman   Total critical care time: 34 minutes  Critical care time was exclusive of separately billable procedures and treating other patients.  Critical care was necessary to treat or prevent imminent or life-threatening deterioration.  Critical care was time spent personally by me on the following activities: development of treatment plan with patient and/or surrogate as well as nursing, discussions with consultants, evaluation of patient's response to treatment, examination of patient, obtaining history from patient or surrogate, ordering and performing treatments and interventions, ordering and review of laboratory studies, ordering and review of radiographic studies, pulse oximetry and re-evaluation of patient's condition.         Final Clinical Impression(s) / ED Diagnoses Final diagnoses:  Shock (HCC)  AKI (acute kidney injury) (HCC)  Hyperbilirubinemia    Rx / DC Orders ED Discharge Orders     None         Elizabeth Manchester, MD 03/30/23 1520

## 2023-03-30 NOTE — ED Provider Notes (Signed)
Patient initially seen by Dr. Durwin Nora.  Please see his note.  Patient being treated for possible sepsis the ED.  Patient noted to have malignant melanoma.  Patient has required IV pressors.  She is noted to have significant lactic acidosis.  cT scan was pending.  It shows findings consistent with her known malignancy.  No signs of acute infection at this time.  Has been started on broad-spectrum antibiotics and is currently on Levophed.  Would give additional fluid bolus.  Case discussed with Dr  Karie Fetch.  Central line recommended if greater than 10 on the levo   Linwood Dibbles, MD 03/30/23 1845

## 2023-03-30 NOTE — Progress Notes (Signed)
   PCCM transfer request    Sending physician: [facility]-EDP  Sending facility: [facility] ED  Reason for transfer: septic shock  Brief case summary: 82 y/o F  H/o melanoma on treatment- chills, lighteaded, hypotensive, no fever initially.  LA 7.5 > 6.1, WBC 21. Mild trop elevation. AKI. T bili 6. CT without source of infection identified> but biliary ductal dilation. On NE. Received cefepime, vanc, flagyl in the ED.    Recommendations made prior to transfer: Con't antibiotics, needs CVC if NE >10 prior to transfer.   Transfer accepted: yes     Steffanie Dunn 03/30/23 6:39 PM [facility]  For contact information, see Amion. If no response to pager, please call PCCM consult pager. After hours, 7PM- 7AM, please call Elink.

## 2023-03-30 NOTE — ED Notes (Signed)
Attempted to get UA but patient also pooped and could not get a clean catch.

## 2023-03-30 NOTE — H&P (Addendum)
NAME:  Elizabeth Norman, MRN:  [id number], DOB:  June 17, 1940, LOS: 0 ADMISSION DATE:  03/30/2023, CONSULTATION DATE:  03/30/2023 REFERRING MD:  Lynelle Doctor - [facility] EDP, CHIEF COMPLAINT:  SOB, chills    History of Present Illness:  82 year old woman who presented to [facility] ED 11/18 with chills, SOB, dizziness, lightheadedness. 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   On presentation to ED, labs demonstrated AKI, AGMA, elevated BNP, WBC 21 and LA 7.5. Patient was hypotensive requiring Levophed initiation with  concern for developing septic shock; broad-spectrum antibiotics were started (vanc/cefepime/Flagyl). CXR unremarkable. CT Head NAICA. CT Chest/A/P demonstrated unchanged splenomegaly, no acute GU findings, +progressive intrahepatic and extrahepatic biliary duct dilation but no history of choledocholithiasis, progressive metastatic adenopathy of the R pelvis and inguinal region. RUQ US demonstrated cholelithiasis without evidence of acute cholecystitis, CBD dilatation of 10mm.  PCCM consulted for ICU admission to [facility].  Pertinent Medical History  Malignant melanoma on tx  Immunotherapy myocarditis CAD HTN Hypothyroidism   Significant Hospital Events: Including procedures, antibiotic start and stop dates in addition to other pertinent events   11/18 - Admitted as a transfer from [facility] for ?sepsis.  Interim History / Subjective:  PCCM consulted for ICU admission Transferred from [facility] for further care  Objective:  Blood pressure (!) 106/53, pulse 98, temperature 97.6 F (36.4 C), temperature source Oral, resp. rate (!) 23, height 5' 2.25" (1.581 m), weight 76.2 kg, SpO2 95%.        Intake/Output Summary (Last 24 hours) at 03/30/2023 1841 Last data filed at 03/30/2023 1414 Gross per 24 hour  Intake 1000 ml  Output --  Net 1000 ml   Filed  Weights   03/30/23 1209  Weight: 76.2 kg   Physical Examination: General: Acutely ill-appearing elderly woman in NAD. Pleasant and conversant. HEENT: [place]/AT, ?midlly icteric sclera, PERRL, moist mucous membranes. Neuro: Awake, oriented x 4. Responds to verbal stimuli. Following commands consistently. Moves all 4 extremities spontaneously. Mild generalized weakness.  CV: RRR, no m/g/r. PULM: Breathing even and unlabored on RA. Lung fields CTAB. GI: Soft, nontender, nondistended. Normoactive bowel sounds. Extremities: Trace bilateral symmetric LE edema noted. Skin: Warm/dry, no rashes.  Resolved Hospital Problem List:    Assessment & Plan:   Septic shock, unclear source, immunocompromised host Lactic acidosis CT Chest/A/P demonstrated intrahepatic and extrahepatic biliary duct dilation but no hx of choledocholithiasis. CXR clear. - Admit to ICU for further care - Goal MAP > 65 - Fluid resuscitation as tolerated - Levophed titrated to goal MAP; patient has Port-A-Cath in place - Trend WBC, fever curve, LA - F/u Cx data - Continue broad-spectrum antibiotics (vanc/cefepime/Flagyl, patient is immunocompromised)  AKI, likely septic ATN versus prerenal Hyponatremia Hypokalemia - Trend BMP - Replete electrolytes as indicated - Monitor I&Os - F/u urine studies - Avoid nephrotoxic agents as able - Ensure adequate renal perfusion  Mild troponinemia CAD ?Pulmonary hypertension  Echo 05/2021 with EF 55-6-%, normal LV function without RWMAs, normal RV function, mildly elevated PASP (RVSP 38), +MR with degenerative MV, moderate TR. - Trend troponin, suspect demand-related - Trend BNP (elevated to 1106 from 218 several months ago) - Cardiac monitoring - Repeat Echo  Hyperbilirubinemia Mild transaminitis Suspect DILI versus hemolysis, more likely DILI/cholestatic picture in the setting of chemotherapeutic agents. CT A/P with intra/extrahepatic ductal dilatation, RUQ US demonstrated  cholelithiasis without evidence of acute cholecystitis, CBD dilatation of 10mm. -  Trend LFTs - F/u hemolysis labs - Consider MRCP for better evaluation - Consider GI consult pending LFT trend  Malignant melanoma  Immunotherapy myocarditis  Port-a-cath in place; on ipilimumab and pembrolizumab, last treated 1 week PTA. - Management per Oncology (followed by Dr. Ellin Saba)  Hypothyroidism - Continue home Synthroid - F/u TSH/T4  Best Practice (right click and "Reselect all SmartList Selections" daily)   Diet/type: Regular consistency (see orders) DVT prophylaxis: SCDs, SQH Pressure ulcer(s): No pressure injuries noted on admission GI prophylaxis: PPI Lines: Central line (Port-A-Cath in place) Foley:  N/A Code Status:  full code Last date of multidisciplinary goals of care discussion [Pending]  Labs   CBC: Recent Labs  Lab 03/25/23 1433 03/30/23 1236  WBC 6.7 21.6*  NEUTROABS 5.1 16.4*  HGB 11.8* 11.4*  HCT 36.9 36.6  MCV 90.2 92.0  PLT 176 160   Basic Metabolic Panel: Recent Labs  Lab 03/25/23 1433 03/30/23 1236  NA 133* 132*  K 3.4* 3.1*  CL 97* 95*  CO2 24 18*  GLUCOSE 132* 78  BUN 12 17  CREATININE 0.81 1.62*  CALCIUM 8.6* 8.5*  MG 1.9  --    GFR: Estimated Creatinine Clearance: 25.7 mL/min (A) (by C-G formula based on SCr of 1.62 mg/dL (H)). Recent Labs  Lab 03/25/23 1433 03/30/23 1236 03/30/23 1407  WBC 6.7 21.6*  --   LATICACIDVEN  --  7.5* 6.1*   Liver Function Tests: Recent Labs  Lab 03/25/23 1433 03/30/23 1236  AST 99* 136*  ALT 48* 77*  ALKPHOS 264* 282*  BILITOT 1.1 6.0*  PROT 6.5 6.2*  ALBUMIN 3.2* 2.7*   No results for input(s): "LIPASE", "AMYLASE" in the last 168 hours. No results for input(s): "AMMONIA" in the last 168 hours.  ABG: No results found for: "PHART", "PCO2ART", "PO2ART", "HCO3", "TCO2", "ACIDBASEDEF", "O2SAT"   Coagulation Profile: Recent Labs  Lab 03/30/23 1236  INR 1.3*   Cardiac Enzymes: Recent Labs   Lab 03/25/23 1433  CKTOTAL 139   HbA1C: Hgb A1c MFr Bld  Date/Time Value Ref Range Status  05/29/2021 02:48 AM 5.6 4.8 - 5.6 % Final    Comment:    (NOTE) Pre diabetes:          5.7%-6.4%  Diabetes:              >6.4%  Glycemic control for   <7.0% adults with diabetes    CBG: No results for input(s): "GLUCAP" in the last 168 hours.  Review of Systems:   Review of systems completed with pertinent positives/negatives outlined in above HPI.  Past Medical History:  She,  has a past medical history of Arthritis, Hypertension, Malignant melanoma of lower leg, right (HCC) (03/27/2021), and Port-A-Cath in place (04/30/2021).   Surgical History:   Past Surgical History:  Procedure Laterality Date   CATARACT EXTRACTION W/PHACO Right 10/03/2014   Procedure: CATARACT EXTRACTION PHACO AND INTRAOCULAR LENS PLACEMENT (IOC);  Surgeon: Jethro Bolus, MD;  Location: [facility];  Service: Ophthalmology;  Laterality: Right;  CDE:10.09   CATARACT EXTRACTION W/PHACO Left 10/10/2014   Procedure: CATARACT EXTRACTION PHACO AND INTRAOCULAR LENS PLACEMENT (IOC);  Surgeon: Jethro Bolus, MD;  Location: [facility];  Service: Ophthalmology;  Laterality: Left;  CDE:8.69   CORNEAL 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   CORNEAL TRANSPLANT Left 2020   DENTAL RESTORATION/EXTRACTION WITH X-RAY     IR IMAGING GUIDED PORT INSERTION  04/05/2021   LEFT HEART CATH AND CORONARY ANGIOGRAPHY N/A 05/28/2021   Procedure: LEFT HEART CATH AND CORONARY  ANGIOGRAPHY;  Surgeon: [name], MD;  Location: [facility] INVAS[facility];  Service: Cardiovascular;  Laterality: N/A;   MELANOMA EXCISION Right 02/12/2021   Procedure: WIDE LOCAL EXCISION RIGHT LOWER LEG MELANOMA , ADVANCEMENT FLAP CLOSURE DEFECT;  Surgeon: Almond Lint, MD;  Location: [facility] OR;  Service: General;  Laterality: Right;   SENTINEL NODE BIOPSY Right 02/12/2021   Procedure: SENTINEL NODE BIOPSY;  Surgeon: Almond Lint, MD;  Location: [facility] OR;  Service: General;  Laterality: Right;     Social History:   reports that she has never smoked. She has never used smokeless tobacco. She reports that she does not drink alcohol and does not use drugs.   Family History:  Her family history includes Cancer in her father; Hypertension in her sister and sister.   Allergies: Allergies  Allergen Reactions   Tape     Pulls skin, please use paper tape    Home Medications: Prior to Admission medications   Medication Sig Start Date End Date Taking? Authorizing Provider  ALPRAZolam Prudy Feeler) 0.5 MG tablet Take 0.5 mg by mouth 3 (three) times daily. 08/22/14  Yes [provider]  calcium carbonate (OS-CAL - DOSED IN MG OF ELEMENTAL CALCIUM) 1250 (500 CA) MG tablet Take 1 tablet by mouth 3 (three) times a week.   Yes [provider]  cholecalciferol (VITAMIN D) 1000 UNITS tablet Take 1,000 Units by mouth 3 (three) times a week.   Yes [provider]  Cyanocobalamin (VITAMIN B-12 PO) Take 1 tablet by mouth once a week.   Yes [provider]  famotidine (PEPCID) 40 MG tablet Take 40 mg by mouth daily. 03/13/23  Yes [provider]  levothyroxine (SYNTHROID) 137 MCG tablet Take 1 tablet (137 mcg total) by mouth daily before breakfast. 12/17/22  Yes [name], MD  metoprolol tartrate (LOPRESSOR) 25 MG tablet Take 25 mg by mouth 2 (two) times daily. 03/28/22  Yes [provider]  omeprazole (PRILOSEC) 40 MG capsule Take 40 mg by mouth daily. 12/08/22  Yes [provider]  prednisoLONE acetate (PRED FORTE) 1 % ophthalmic suspension Place 1 drop into both eyes daily. Instill one drop into the right eye twice daily and one drop into the left eye once daily. 12/18/20  Yes [provider]    Signature:   Faythe Ghee [facility] 03/30/23 7:52 PM  Please see [email or web address] for pager details.  From 7A-7P if no response, please call [phone] After hours, please call ELink [phone]

## 2023-03-30 NOTE — ED Notes (Signed)
Patient transported to CT 

## 2023-03-30 NOTE — ED Triage Notes (Addendum)
Pt presents with SOB, dizziness, and chills since last night, pt has hx of melanoma cancer, denies chest pain/fever

## 2023-03-30 NOTE — ED Notes (Signed)
Back from CT

## 2023-03-30 NOTE — Progress Notes (Signed)
Pharmacy Antibiotic Note  Elizabeth Norman is a 82 y.o. female admitted on 03/30/2023 with unknown source of infection.  Pharmacy has been consulted for vancomycin and cefepime dosing.  Plan: Vancomycin 1.5g IV bolus x1 dose Vancomycin 1g q48h  Cefepime 2g IV q24h  Height: 5' 2.25" (158.1 cm) Weight: 76.2 kg (168 lb) IBW/kg (Calculated) : 50.68  Temp (24hrs), Avg:97.6 F (36.4 C), Min:97.6 F (36.4 C), Max:97.6 F (36.4 C)  Recent Labs  Lab 03/25/23 1433 03/30/23 1236  WBC 6.7 21.6*  CREATININE 0.81 1.62*  LATICACIDVEN  --  7.5*    Estimated Creatinine Clearance: 25.7 mL/min (A) (by C-G formula based on SCr of 1.62 mg/dL (H)).    Allergies  Allergen Reactions   Tape     Pulls skin, please use paper tape    Antimicrobials this admission: 11/18 vancomycin IV >>  11/18 cefepime >>   Microbiology results: 11/18 BCx: pending 11/18 UCx: pending  11/18 Covid-19: pending  Thank you for allowing pharmacy to be a part of this patient's care.  Adrian Prows 03/30/2023 2:11 PM

## 2023-03-31 ENCOUNTER — Inpatient Hospital Stay (HOSPITAL_COMMUNITY): Payer: Medicare Other

## 2023-03-31 DIAGNOSIS — A419 Sepsis, unspecified organism: Secondary | ICD-10-CM | POA: Diagnosis not present

## 2023-03-31 DIAGNOSIS — N179 Acute kidney failure, unspecified: Secondary | ICD-10-CM | POA: Diagnosis not present

## 2023-03-31 DIAGNOSIS — R0609 Other forms of dyspnea: Secondary | ICD-10-CM | POA: Diagnosis not present

## 2023-03-31 DIAGNOSIS — R6521 Severe sepsis with septic shock: Secondary | ICD-10-CM

## 2023-03-31 LAB — ECHOCARDIOGRAM COMPLETE
AR max vel: 2.19 cm2
AV Peak grad: 9.1 mm[Hg]
Ao pk vel: 1.51 m/s
Area-P 1/2: 4.36 cm2
Height: 62.25 in
MV M vel: 2.39 m/s
MV Peak grad: 22.8 mm[Hg]
S' Lateral: 3.3 cm
Weight: 2839.52 [oz_av]

## 2023-03-31 LAB — BASIC METABOLIC PANEL
Anion gap: 11 (ref 5–15)
BUN: 19 mg/dL (ref 8–23)
CO2: 23 mmol/L (ref 22–32)
Calcium: 8.6 mg/dL — ABNORMAL LOW (ref 8.9–10.3)
Chloride: 98 mmol/L (ref 98–111)
Creatinine, Ser: 1.1 mg/dL — ABNORMAL HIGH (ref 0.44–1.00)
GFR, Estimated: 50 mL/min — ABNORMAL LOW (ref >60)
Glucose, Bld: 96 mg/dL (ref 70–99)
Potassium: 3.7 mmol/L (ref 3.5–5.1)
Sodium: 132 mmol/L — ABNORMAL LOW (ref 135–145)

## 2023-03-31 LAB — URINALYSIS, W/ REFLEX TO CULTURE (INFECTION SUSPECTED)
Bilirubin Urine: NEGATIVE
Glucose, UA: 50 mg/dL — AB
Ketones, ur: NEGATIVE mg/dL
Nitrite: NEGATIVE
Protein, ur: 30 mg/dL — AB
Specific Gravity, Urine: 1.013 (ref 1.005–1.030)
pH: 5 (ref 5.0–8.0)

## 2023-03-31 LAB — CBC
HCT: 34.8 % — ABNORMAL LOW (ref 36.0–46.0)
Hemoglobin: 11.3 g/dL — ABNORMAL LOW (ref 12.0–15.0)
MCH: 28.2 pg (ref 26.0–34.0)
MCHC: 32.5 g/dL (ref 30.0–36.0)
MCV: 86.8 fL (ref 80.0–100.0)
Platelets: 179 10*3/uL (ref 150–400)
RBC: 4.01 MIL/uL (ref 3.87–5.11)
RDW: 14 % (ref 11.5–15.5)
WBC: 29 10*3/uL — ABNORMAL HIGH (ref 4.0–10.5)
nRBC: 0 % (ref 0.0–0.2)

## 2023-03-31 LAB — LACTIC ACID, PLASMA: Lactic Acid, Venous: 2.1 mmol/L (ref 0.5–1.9)

## 2023-03-31 LAB — ABO/RH: ABO/RH(D): B POS

## 2023-03-31 LAB — PROCALCITONIN: Procalcitonin: 17.4 ng/mL

## 2023-03-31 LAB — PHOSPHORUS: Phosphorus: 2.5 mg/dL (ref 2.5–4.6)

## 2023-03-31 LAB — MRSA NEXT GEN BY PCR, NASAL: MRSA by PCR Next Gen: NOT DETECTED

## 2023-03-31 LAB — MAGNESIUM: Magnesium: 1.6 mg/dL — ABNORMAL LOW (ref 1.7–2.4)

## 2023-03-31 MED ORDER — LEVOTHYROXINE SODIUM 25 MCG PO TABS
137.0000 ug | ORAL_TABLET | Freq: Every day | ORAL | Status: DC
  Administered 2023-03-31 – 2023-04-01 (×2): 137 ug via ORAL
  Filled 2023-03-31 (×3): qty 1

## 2023-03-31 MED ORDER — PERFLUTREN LIPID MICROSPHERE
1.0000 mL | INTRAVENOUS | Status: AC | PRN
  Administered 2023-03-31: 4 mL via INTRAVENOUS

## 2023-03-31 MED ORDER — SODIUM CHLORIDE 0.9 % IV SOLN
2.0000 g | Freq: Two times a day (BID) | INTRAVENOUS | Status: DC
  Administered 2023-03-31 – 2023-04-01 (×2): 2 g via INTRAVENOUS
  Filled 2023-03-31 (×2): qty 12.5

## 2023-03-31 MED ORDER — ALPRAZOLAM 0.25 MG PO TABS
0.2500 mg | ORAL_TABLET | Freq: Three times a day (TID) | ORAL | Status: DC | PRN
  Administered 2023-03-31 – 2023-04-02 (×4): 0.25 mg via ORAL
  Filled 2023-03-31 (×4): qty 1

## 2023-03-31 MED ORDER — PREDNISOLONE ACETATE 1 % OP SUSP
1.0000 [drp] | Freq: Every day | OPHTHALMIC | Status: DC
  Administered 2023-03-31 – 2023-04-03 (×4): 1 [drp] via OPHTHALMIC
  Filled 2023-03-31: qty 5

## 2023-03-31 MED ORDER — ALPRAZOLAM 0.5 MG PO TABS: 0.5000 mg | ORAL_TABLET | Freq: Three times a day (TID) | ORAL | Status: DC | PRN

## 2023-03-31 MED ORDER — MAGNESIUM SULFATE 2 GM/50ML IV SOLN
2.0000 g | Freq: Once | INTRAVENOUS | Status: AC
  Administered 2023-03-31: 2 g via INTRAVENOUS
  Filled 2023-03-31: qty 50

## 2023-03-31 MED ORDER — PANTOPRAZOLE SODIUM 40 MG PO TBEC
40.0000 mg | DELAYED_RELEASE_TABLET | Freq: Every day | ORAL | Status: DC
  Administered 2023-03-31 – 2023-04-03 (×4): 40 mg via ORAL
  Filled 2023-03-31 (×4): qty 1

## 2023-03-31 NOTE — Progress Notes (Signed)
NAME:  Elizabeth Norman, MRN:  [id number], DOB:  Nov 11, 1940, LOS: 1 ADMISSION DATE:  03/30/2023, CONSULTATION DATE:  03/30/2023 REFERRING MD:  Lynelle Doctor - [facility] EDP, CHIEF COMPLAINT:  SOB, chills    History of Present Illness:  82 year old woman who presented to [facility] ED 11/18 with chills, SOB, dizziness, lightheadedness. 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   On presentation to ED, labs demonstrated AKI, AGMA, elevated BNP, WBC 21 and LA 7.5. Patient was hypotensive requiring Levophed initiation with  concern for developing septic shock; broad-spectrum antibiotics were started (vanc/cefepime/Flagyl). CXR unremarkable. CT Head NAICA. CT Chest/A/P demonstrated unchanged splenomegaly, no acute GU findings, +progressive intrahepatic and extrahepatic biliary duct dilation but no history of choledocholithiasis, progressive metastatic adenopathy of the R pelvis and inguinal region. RUQ US demonstrated cholelithiasis without evidence of acute cholecystitis, CBD dilatation of 10mm.  PCCM consulted for ICU admission [facility].  Pertinent Medical History  Malignant melanoma on tx  Immunotherapy myocarditis CAD HTN Hypothyroidism   Significant Hospital Events: Including procedures, antibiotic start and stop dates in addition to other pertinent events   11/18 - Admitted as a transfer from [facility] for ?sepsis.  Interim History / Subjective:  PCCM consulted for ICU admission Transferred from [facility] for further care  Objective:  Blood pressure 100/82, pulse (!) 109, temperature 97.8 F (36.6 C), temperature source Oral, resp. rate 16, height 5' 2.25" (1.581 m), weight 80.5 kg, SpO2 100%.        Intake/Output Summary (Last 24 hours) at 03/31/2023 1021 Last data filed at 03/31/2023 1000 Gross per 24 hour  Intake 2337.34 ml  Output 200 ml  Net 2137.34 ml    Filed Weights   03/30/23 1209 03/30/23 2149  Weight: 76.2 kg 80.5 kg   Physical Examination: Resting flat comfortably no respiratory distress Right chest wall port clean, dry, no rash Breathing non labored no wheeze RRR no mrg Abdomen soft, mild lower abdominal tenderness No peripheral edema No rashes  Resolved Hospital Problem List:    Assessment & Plan:   Septic shock, unclear source, immunocompromised host Lactic acidosis improving CT Chest/A/P demonstrated intrahepatic and extrahepatic biliary duct dilation but no hx of choledocholithiasis. CXR clear. - titrate down vasopressors goal MAP>65. Expect this to be off thi smorning - empiric vanc/cefepime/flagyl. Would maintain this for 48 hours or until blood cultures negative.   AKI, likely septic ATN versus prerenal Hyponatremia Hypokalemia - Trend BMP - Replete electrolytes as indicated - UOP improving and Cr improving with IVF resusictation  Elevated Troponin/demand ischemia CAD ?Pulmonary hypertension  Echo 05/2021 with EF 55-6-%, normal LV function without RWMAs, normal RV function, mildly elevated PASP (RVSP 38), +MR with degenerative MV, moderate TR. - Trend troponin, suspect demand-related - Trend BNP (elevated to 1106 from 218 several months ago) - Cardiac monitoring - Repeat Echo  Hyperbilirubinemia Mild transaminitis Symptomatic cholelithiasis Suspect DILI versus hemolysis, more likely DILI/cholestatic picture in the setting of chemotherapeutic agents. CT A/P with intra/extrahepatic ductal dilatation, RUQ US demonstrated cholelithiasis without evidence of acute cholecystitis, CBD dilatation of 10mm. - Trend LFTs, AP may be elevated due to bony metastases.  - F/u hemolysis labs - Consider MRCP for better evaluation - Consider GI consult pending LFT trend  Malignant melanoma  Immunotherapy myocarditis  Port-a-cath in place; on ipilimumab and pembrolizumab, last treated 1 week PTA. - Management per  Oncology (followed by Dr. Ellin Saba) - was due  to have chemo this week which obviously will be on hold.   Hypothyroidism - Continue home Synthroid - F/u TSH/T4  Best Practice (right click and "Reselect all SmartList Selections" daily)   Diet/type: Regular consistency (see orders) DVT prophylaxis: SCDs, SQH Pressure ulcer(s): No pressure injuries noted on admission GI prophylaxis: PPI Lines: Central line (Port-A-Cath in place) Foley:  N/A Code Status:  full code  She is titrating off vasopressors this morning. Will sign out to [facility] to assume care 11/20  The patient is critically ill due to shock.  Critical care was necessary to treat or prevent imminent or life-threatening deterioration.  Critical care was time spent personally by me on the following activities: development of treatment plan with patient and/or surrogate as well as nursing, discussions with consultants, evaluation of patient's response to treatment, examination of patient, obtaining history from patient or surrogate, ordering and performing treatments and interventions, ordering and review of laboratory studies, ordering and review of radiographic studies, pulse oximetry, re-evaluation of patient's condition and participation in multidisciplinary rounds.   Critical Care Time devoted to patient care services described in this note is 35 minutes. This time reflects time of care of this signee Charlott Holler . This critical care time does not reflect separately billable procedures or procedure time, teaching time or supervisory time of PA/NP/Med student/Med Resident etc but could involve care discussion time.       Charlott Holler [facility] Pulmonary and [facility] 03/31/2023 10:21 AM  Pager: see AMION  If no response to pager , please call critical care on call (see AMION) until 7pm After 7:00 pm call Elink

## 2023-03-31 NOTE — Progress Notes (Signed)
PHARMACY NOTE:  ANTIMICROBIAL RENAL DOSAGE ADJUSTMENT  Current antimicrobial regimen includes a mismatch between antimicrobial dosage and estimated renal function.  As per policy approved by the Pharmacy & Therapeutics and Medical Executive Committees, the antimicrobial dosage will be adjusted accordingly.  Current antimicrobial dosage:  Cefepime 2g q24h   Indication: Septic shock   Renal Function:  Estimated Creatinine Clearance: 39 mL/min (A) (by C-G formula based on SCr of 1.1 mg/dL (H)).    Antimicrobial dosage has been changed to:  Cefepime 2g q12h   Thank you for allowing pharmacy to be a part of this patient's care.  Estill Batten, PharmD, BCCCP  03/31/2023 9:32 AM

## 2023-03-31 NOTE — Progress Notes (Signed)
Echocardiogram 2D Echocardiogram has been performed.  Elizabeth Norman 03/31/2023, 3:52 PM

## 2023-03-31 NOTE — Progress Notes (Signed)
Patient doing well off vasopressors. Blood cultures growing gram negative rods. Will stop vancomycin. Sign out to [facility] to assume care 11/20. Appropriate for transfer to med/surg.   Durel Salts, MD Pulmonary and Critical Care Medicine [facility] 03/31/2023 3:19 PM Pager: see AMION  If no response to pager, please call critical care on call (see AMION) until 7pm After 7:00 pm call Elink

## 2023-04-01 ENCOUNTER — Inpatient Hospital Stay (HOSPITAL_COMMUNITY): Payer: Medicare Other

## 2023-04-01 ENCOUNTER — Ambulatory Visit: Payer: Medicare Other | Admitting: Hematology

## 2023-04-01 ENCOUNTER — Inpatient Hospital Stay: Payer: Medicare Other

## 2023-04-01 DIAGNOSIS — R748 Abnormal levels of other serum enzymes: Secondary | ICD-10-CM

## 2023-04-01 DIAGNOSIS — B9620 Unspecified Escherichia coli [E. coli] as the cause of diseases classified elsewhere: Secondary | ICD-10-CM | POA: Diagnosis not present

## 2023-04-01 DIAGNOSIS — N179 Acute kidney failure, unspecified: Secondary | ICD-10-CM | POA: Diagnosis not present

## 2023-04-01 DIAGNOSIS — R579 Shock, unspecified: Secondary | ICD-10-CM | POA: Diagnosis not present

## 2023-04-01 DIAGNOSIS — A4151 Sepsis due to Escherichia coli [E. coli]: Secondary | ICD-10-CM | POA: Diagnosis not present

## 2023-04-01 DIAGNOSIS — R7881 Bacteremia: Secondary | ICD-10-CM | POA: Diagnosis present

## 2023-04-01 LAB — BLOOD CULTURE ID PANEL (REFLEXED) - BCID2

## 2023-04-01 LAB — COMPREHENSIVE METABOLIC PANEL
ALT: 57 U/L — ABNORMAL HIGH (ref 0–44)
AST: 67 U/L — ABNORMAL HIGH (ref 15–41)
Albumin: 1.9 g/dL — ABNORMAL LOW (ref 3.5–5.0)
Alkaline Phosphatase: 216 U/L — ABNORMAL HIGH (ref 38–126)
Anion gap: 5 (ref 5–15)
BUN: 15 mg/dL (ref 8–23)
CO2: 26 mmol/L (ref 22–32)
Calcium: 8.1 mg/dL — ABNORMAL LOW (ref 8.9–10.3)
Chloride: 103 mmol/L (ref 98–111)
Creatinine, Ser: 0.8 mg/dL (ref 0.44–1.00)
GFR, Estimated: >60 mL/min (ref >60)
Glucose, Bld: 122 mg/dL — ABNORMAL HIGH (ref 70–99)
Potassium: 4.2 mmol/L (ref 3.5–5.1)
Sodium: 134 mmol/L — ABNORMAL LOW (ref 135–145)
Total Bilirubin: 1.6 mg/dL — ABNORMAL HIGH (ref <1.2)
Total Protein: 4.9 g/dL — ABNORMAL LOW (ref 6.5–8.1)

## 2023-04-01 LAB — HAPTOGLOBIN: Haptoglobin: 124 mg/dL (ref 41–333)

## 2023-04-01 LAB — CBC
HCT: 30.3 % — ABNORMAL LOW (ref 36.0–46.0)
Hemoglobin: 9.8 g/dL — ABNORMAL LOW (ref 12.0–15.0)
MCH: 28.5 pg (ref 26.0–34.0)
MCHC: 32.3 g/dL (ref 30.0–36.0)
MCV: 88.1 fL (ref 80.0–100.0)
Platelets: 135 10*3/uL — ABNORMAL LOW (ref 150–400)
RBC: 3.44 MIL/uL — ABNORMAL LOW (ref 3.87–5.11)
RDW: 14 % (ref 11.5–15.5)
WBC: 12 10*3/uL — ABNORMAL HIGH (ref 4.0–10.5)
nRBC: 0 % (ref 0.0–0.2)

## 2023-04-01 MED ORDER — GADOBUTROL 1 MMOL/ML IV SOLN
8.0000 mL | Freq: Once | INTRAVENOUS | Status: AC | PRN
  Administered 2023-04-01: 8 mL via INTRAVENOUS

## 2023-04-01 MED ORDER — ALTEPLASE 2 MG IJ SOLR
2.0000 mg | Freq: Once | INTRAMUSCULAR | Status: DC
  Filled 2023-04-01: qty 2

## 2023-04-01 MED ORDER — INFLUENZA VAC A&B SURF ANT ADJ 0.5 ML IM SUSY
0.5000 mL | PREFILLED_SYRINGE | INTRAMUSCULAR | Status: DC
  Filled 2023-04-01: qty 0.5

## 2023-04-01 MED ORDER — SODIUM CHLORIDE 0.9% FLUSH
10.0000 mL | Freq: Two times a day (BID) | INTRAVENOUS | Status: DC
  Administered 2023-04-01 – 2023-04-03 (×5): 10 mL

## 2023-04-01 MED ORDER — SODIUM CHLORIDE 0.9 % IV SOLN
2.0000 g | INTRAVENOUS | Status: DC
  Administered 2023-04-01: 2 g via INTRAVENOUS
  Filled 2023-04-01: qty 20

## 2023-04-01 MED ORDER — SODIUM CHLORIDE 0.9% FLUSH: 10.0000 mL | INTRAVENOUS | Status: DC | PRN

## 2023-04-01 MED ORDER — KETOROLAC TROMETHAMINE 15 MG/ML IJ SOLN
15.0000 mg | INTRAMUSCULAR | Status: AC
  Administered 2023-04-01: 15 mg via INTRAVENOUS
  Filled 2023-04-01: qty 1

## 2023-04-01 MED ORDER — LEVOTHYROXINE SODIUM 25 MCG PO TABS
125.0000 ug | ORAL_TABLET | Freq: Every day | ORAL | Status: DC
  Administered 2023-04-02 – 2023-04-03 (×2): 125 ug via ORAL
  Filled 2023-04-01 (×2): qty 1

## 2023-04-01 NOTE — Progress Notes (Signed)
[name], is a 82 y.o. female, DOB - 1941/01/26, HQI:[id number] Admit date - 03/30/2023    Outpatient Primary MD for the patient is [name], [name], MD  LOS - 2  days  Chief Complaint  Patient presents with   Shortness of Breath       Brief summary   Patient is 82 year old woman who presented to [facility] ED 11/18 with chills, SOB, dizziness, lightheadedness. 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    On presentation to ED, labs demonstrated AKI, AGMA, elevated BNP, WBC 21 and LA 7.5. Patient was hypotensive requiring Levophed initiation with  concern for developing septic shock; broad-spectrum antibiotics were started (vanc/cefepime/Flagyl). CXR unremarkable. CT Head NAICA. CT Chest/A/P demonstrated unchanged splenomegaly, no acute GU findings, +progressive intrahepatic and extrahepatic biliary duct dilation but no history of choledocholithiasis, progressive metastatic adenopathy of the R pelvis and inguinal region. RUQ US demonstrated cholelithiasis without evidence of acute cholecystitis, CBD dilatation of 10mm.  PCCM was consulted for ICU admission to [facility]. [facility] assumed care on 11/20   Assessment & Plan    Principal Problem:  Septic shock,  immunocompromised host E. coli bacteremia, likely GI source -Patient presented from [facility] ED with septic shock, leukocytosis, lactic acidosis, hypotensive, BP 60/38.  She was transferred to [facility] ICU and was placed on vasopressors. -Placed on empiric vancomycin, cefepime, Flagyl - CT Chest/A/P showed intrahepatic and extrahepatic biliary duct dilation, cholelithiasis without cholecystitis.  No evidence of choledocholithiasis.  Progressive metastatic adenopathy in the right  external iliac and inguinal region consistent with worsening metastatic melanoma -Blood cultures 11/18 positive for E. Coli, antibiotics narrowed to IV ceftriaxone -Abdominal ultrasound showed cholelithiasis, no acute cholecystitis, CBD dilated measuring 10 mm, recommending MRCP or ERCP -Leukocytosis improving, procalcitonin 17.4 on admission, WBC 12.0 today (29.0 on 11/19) -Requested GI consult  Active problems  Transaminitis with hyperbilirubinemia Symptomatic cholelithiasis -CT abdomen pelvis showed intrahepatic and extrahepatic biliary duct dilation, cholelithiasis without cholecystitis, no choledocholithiasis.   -Abdominal ultrasound showed cholelithiasis, no acute cholecystitis, CBD dilated measuring 10 mm  -LFTs improving, also possibly worsened due to shock liver from septic shock.  Alk phos elevation could be from bony metastasis -GI consulted.   Acute kidney injury with lactic acidosis -Likely prerenal versus septic ATN  -Presented with creatinine of 1.62, creatinine was 0.8 on 03/25/2023 -Was placed on IV fluid resuscitation and vasopressors.  Creatinine now improving, 0.8  Hyponatremia, hypokalemia -Presented with sodium of 132 trended down to 130 likely due to #1 -Patient was placed on IV fluid hydration, improving to 134 today  Elevated Troponin/demand ischemia, CAD -Likely due to demand ischemia. -2D echo 11/19 showed EF of 60 to 65%, mild LVH, no regional wall motion abnormalities, G1 DD right ventricular systolic function normal.     Malignant melanoma  Immunotherapy myocarditis  - Port-a-cath in place; on ipilimumab and pembrolizumab, last treated 1 week  PTA. - Management per Oncology (followed by Dr. Ellin Saba) - was due to have chemo this week, currently on hold    Hypothyroidism -TSH 0.173, free T4 elevated 1.4 -Decrease Synthroid to 125 mcg daily  Obesity  Estimated body mass index is 32.2 kg/m as calculated from the following:   Height as of this  encounter: 5' 2.25" (1.581 m).   Weight as of this encounter: 80.5 kg.  Code Status: Full CODE STATUS DVT Prophylaxis:  heparin injection 5,000 Units Start: 03/31/23 1400 SCDs Start: 03/30/23 2150   Level of Care: Level of care: Telemetry Medical Family Communication: Updated patient Disposition Plan:      Remains inpatient appropriate: Awaiting GI evaluation, PT evaluation   Procedures:  None  Consultants:   Admitted by CCM GI  Antimicrobials:   Anti-infectives (From admission, onward)    Start     Dose/Rate Route Frequency Ordered Stop   04/01/23 1500  vancomycin (VANCOCIN) IVPB 1000 mg/200 mL premix  Status:  Discontinued        1,000 mg 200 mL/hr over 60 Minutes Intravenous Every 48 hours 03/30/23 1425 03/31/23 1519   04/01/23 1200  cefTRIAXone (ROCEPHIN) 2 g in sodium chloride 0.9 % 100 mL IVPB        2 g 200 mL/hr over 30 Minutes Intravenous Every 24 hours 04/01/23 0959     03/31/23 1500  ceFEPIme (MAXIPIME) 2 g in sodium chloride 0.9 % 100 mL IVPB  Status:  Discontinued        2 g 200 mL/hr over 30 Minutes Intravenous Every 24 hours 03/30/23 1421 03/31/23 0932   03/31/23 1500  ceFEPIme (MAXIPIME) 2 g in sodium chloride 0.9 % 100 mL IVPB  Status:  Discontinued        2 g 200 mL/hr over 30 Minutes Intravenous Every 12 hours 03/31/23 0932 04/01/23 0959   03/30/23 1445  metroNIDAZOLE (FLAGYL) IVPB 500 mg  Status:  Discontinued        500 mg 100 mL/hr over 60 Minutes Intravenous Every 12 hours 03/30/23 1443 04/01/23 0959   03/30/23 1430  vancomycin (VANCOREADY) IVPB 1500 mg/300 mL        1,500 mg 150 mL/hr over 120 Minutes Intravenous  Once 03/30/23 1415 03/30/23 1730   03/30/23 1415  ceFEPIme (MAXIPIME) 2 g in sodium chloride 0.9 % 100 mL IVPB        2 g 200 mL/hr over 30 Minutes Intravenous  Once 03/30/23 1406 03/30/23 1458   03/30/23 1415  metroNIDAZOLE (FLAGYL) IVPB 500 mg  Status:  Discontinued        500 mg 100 mL/hr over 60 Minutes Intravenous  Once 03/30/23  1406 03/30/23 1443   03/30/23 1415  vancomycin (VANCOCIN) IVPB 1000 mg/200 mL premix  Status:  Discontinued        1,000 mg 200 mL/hr over 60 Minutes Intravenous  Once 03/30/23 1406 03/30/23 1415          Medications  Chlorhexidine Gluconate Cloth  6 each Topical Daily   heparin  5,000 Units Subcutaneous Q8H   [START ON 04/02/2023] influenza vaccine adjuvanted  0.5 mL Intramuscular Tomorrow-1000   levothyroxine  137 mcg Oral Q0600   pantoprazole  40 mg Oral Daily   prednisoLONE acetate  1 drop Both Eyes Q1200   sodium chloride flush  10-40 mL Intracatheter Q12H      Subjective:   Elizabeth Norman was seen and examined today.  Feels a lot better, no acute complaints, asking about disposition.  No chest pain, shortness of breath, nausea vomiting or acute abdominal pain.  BP improving.  Objective:   Vitals:   03/31/23 1819 03/31/23 2121 04/01/23 0458 04/01/23 0752  BP: (!) 145/65 128/68 (!) 120/48 (!) 115/93  Pulse: (!) 106 (!) 105 100 93  Resp:  18 18 18   Temp: 98.3 F (36.8 C) 99.9 F (37.7 C) 99.3 F (37.4 C) (!) 97.5 F (36.4 C)  TempSrc: Oral Oral Oral Oral  SpO2: 99% 98% 95% 96%  Weight:      Height:        Intake/Output Summary (Last 24 hours) at 04/01/2023 1337 Last data filed at 04/01/2023 1020 Gross per 24 hour  Intake 403.01 ml  Output 100 ml  Net 303.01 ml     Wt Readings from Last 3 Encounters:  03/30/23 80.5 kg  03/11/23 76.2 kg  02/19/23 76.7 kg     Exam General: Alert and oriented x 3, NAD Cardiovascular: S1 S2 auscultated,  RRR Respiratory: Clear to auscultation bilaterally, no wheezing Gastrointestinal: Soft, nontender, nondistended, + bowel sounds Ext: no pedal edema bilaterally Neuro: no new deficits Psych: Normal affect     Data Reviewed:  I have personally reviewed following labs    CBC Lab Results  Component Value Date   WBC 12.0 (H) 04/01/2023   RBC 3.44 (L) 04/01/2023   HGB 9.8 (L) 04/01/2023   HCT 30.3 (L) 04/01/2023    MCV 88.1 04/01/2023   MCH 28.5 04/01/2023   PLT 135 (L) 04/01/2023   MCHC 32.3 04/01/2023   RDW 14.0 04/01/2023   LYMPHSABS 0.2 (L) 03/30/2023   MONOABS 0.4 03/30/2023   EOSABS 0.0 03/30/2023   BASOSABS 0.2 (H) 03/30/2023     Last metabolic panel Lab Results  Component Value Date   NA 134 (L) 04/01/2023   K 4.2 04/01/2023   CL 103 04/01/2023   CO2 26 04/01/2023   BUN 15 04/01/2023   CREATININE 0.80 04/01/2023   GLUCOSE 122 (H) 04/01/2023   GFRNONAA >60 04/01/2023   GFRAA >60 09/29/2014   CALCIUM 8.1 (L) 04/01/2023   PHOS 2.5 03/31/2023   PROT 4.9 (L) 04/01/2023   ALBUMIN 1.9 (L) 04/01/2023   BILITOT 1.6 (H) 04/01/2023   ALKPHOS 216 (H) 04/01/2023   AST 67 (H) 04/01/2023   ALT 57 (H) 04/01/2023   ANIONGAP 5 04/01/2023    CBG (last 3)  Recent Labs    03/30/23 2206 03/30/23 2237 03/30/23 2337  GLUCAP 62* 174* 108*      Coagulation Profile: Recent Labs  Lab 03/30/23 1236  INR 1.3*     Radiology Studies: I have personally reviewed the imaging studies  ECHOCARDIOGRAM COMPLETE  Result Date: 03/31/2023    ECHOCARDIOGRAM REPORT   Patient Name:   Elizabeth Norman Date of Exam: 03/31/2023 Medical Rec #:  [id number]     Height:       62.2 in Accession #:    [id number]    Weight:       177.5 lb Date of Birth:  08/29/1940      BSA:          1.822 m Patient Age:    82 years      BP:           104/54 mmHg Patient Gender: F             HR:           99 bpm. Exam Location:  Inpatient Procedure: 2D Echo, Cardiac Doppler,  Color Doppler and Intracardiac            Opacification Agent Indications:    Dyspnea R06.00  History:        Patient has prior history of Echocardiogram examinations, most                 recent 06/04/2021. Previous Myocardial Infarction; Risk                 Factors:Hypertension.  Sonographer:    Lucendia Herrlich RCS Referring Phys: Elenore Paddy REESE IMPRESSIONS  1. Left ventricular ejection fraction, by estimation, is 60 to 65%. The left ventricle has normal  function. The left ventricle has no regional wall motion abnormalities. There is mild left ventricular hypertrophy of the basal-septal segment. Left ventricular diastolic parameters are consistent with Grade I diastolic dysfunction (impaired relaxation).  2. Right ventricular systolic function is normal. The right ventricular size is normal. There is normal pulmonary artery systolic pressure. The estimated right ventricular systolic pressure is 28.6 mmHg.  3. The mitral valve is degenerative. No evidence of mitral valve regurgitation. No evidence of mitral stenosis.  4. The aortic valve is tricuspid. Aortic valve regurgitation is trivial. Aortic valve sclerosis/calcification is present, without any evidence of aortic stenosis. Aortic valve Vmax measures 1.51 m/s.  5. The inferior vena cava is normal in size with greater than 50% respiratory variability, suggesting right atrial pressure of 3 mmHg. FINDINGS  Left Ventricle: Left ventricular ejection fraction, by estimation, is 60 to 65%. The left ventricle has normal function. The left ventricle has no regional wall motion abnormalities. Definity contrast agent was given IV to delineate the left ventricular  endocardial borders. The left ventricular internal cavity size was normal in size. There is mild left ventricular hypertrophy of the basal-septal segment. Left ventricular diastolic parameters are consistent with Grade I diastolic dysfunction (impaired relaxation). Normal left ventricular filling pressure. Right Ventricle: The right ventricular size is normal. No increase in right ventricular wall thickness. Right ventricular systolic function is normal. There is normal pulmonary artery systolic pressure. The tricuspid regurgitant velocity is 2.53 m/s, and  with an assumed right atrial pressure of 3 mmHg, the estimated right ventricular systolic pressure is 28.6 mmHg. Left Atrium: Left atrial size was normal in size. Right Atrium: Right atrial size was normal in  size. Pericardium: There is no evidence of pericardial effusion. Mitral Valve: The mitral valve is degenerative in appearance. Mild to moderate mitral annular calcification. No evidence of mitral valve regurgitation. No evidence of mitral valve stenosis. Tricuspid Valve: The tricuspid valve is normal in structure. Tricuspid valve regurgitation is trivial. No evidence of tricuspid stenosis. Aortic Valve: The aortic valve is tricuspid. Aortic valve regurgitation is trivial. Aortic valve sclerosis/calcification is present, without any evidence of aortic stenosis. Aortic valve peak gradient measures 9.1 mmHg. Pulmonic Valve: The pulmonic valve was normal in structure. Pulmonic valve regurgitation is not visualized. No evidence of pulmonic stenosis. Aorta: The aortic root is normal in size and structure. Venous: The inferior vena cava is normal in size with greater than 50% respiratory variability, suggesting right atrial pressure of 3 mmHg. IAS/Shunts: No atrial level shunt detected by color flow Doppler.  LEFT VENTRICLE PLAX 2D LVIDd:         4.50 cm   Diastology LVIDs:         3.30 cm   LV e' lateral:   6.85 cm/s LV PW:         1.10 cm   LV E/e'  lateral: 9.6 LV IVS:        1.10 cm LVOT diam:     1.90 cm LV SV:         54 LV SV Index:   30 LVOT Area:     2.84 cm  RIGHT VENTRICLE             IVC RV S prime:     15.10 cm/s  IVC diam: 1.50 cm TAPSE (M-mode): 1.7 cm LEFT ATRIUM             Index        RIGHT ATRIUM           Index LA diam:        3.30 cm 1.81 cm/m   RA Area:     10.20 cm LA Vol (A2C):   40.3 ml 22.12 ml/m  RA Volume:   15.60 ml  8.56 ml/m LA Vol (A4C):   40.7 ml 22.34 ml/m LA Biplane Vol: 42.3 ml 23.22 ml/m  AORTIC VALVE AV Area (Vmax): 2.19 cm AV Vmax:        151.00 cm/s AV Peak Grad:   9.1 mmHg LVOT Vmax:      116.67 cm/s LVOT Vmean:     78.633 cm/s LVOT VTI:       0.190 m  AORTA Ao Root diam: 3.10 cm Ao Asc diam:  3.40 cm MITRAL VALVE               TRICUSPID VALVE MV Area (PHT): 4.36 cm    TR  Peak grad:   25.6 mmHg MV Decel Time: 174 msec    TR Vmax:        253.00 cm/s MR Peak grad: 22.8 mmHg MR Vmax:      239.00 cm/s  SHUNTS MV E velocity: 65.60 cm/s  Systemic VTI:  0.19 m MV A velocity: 88.60 cm/s  Systemic Diam: 1.90 cm MV E/A ratio:  0.74 Armanda Magic MD Electronically signed by Armanda Magic MD Signature Date/Time: 03/31/2023/7:35:40 PM    Final    US Abdomen Limited  Result Date: 03/30/2023 CLINICAL DATA:  Right upper quadrant pain EXAM: ULTRASOUND ABDOMEN LIMITED RIGHT UPPER QUADRANT COMPARISON:  CT today FINDINGS: Gallbladder: Numerous layering small stones within the gallbladder. No wall thickening or sonographic Murphy sign. Common bile duct: Diameter: Dilated, measuring up to 10 mm. The distal duct is obscured by overlying bowel gas. Liver: No focal lesion identified. Within normal limits in parenchymal echogenicity. Portal vein is patent on color Doppler imaging with normal direction of blood flow towards the liver. Other: None. IMPRESSION: Cholelithiasis.  No sonographic evidence of acute cholecystitis. Common bile duct is dilated measuring 10 mm. Distal duct cannot be visualized due to overlying bowel gas. Recommend correlation with LFTs. If further evaluation is felt warranted, MRCP or ERCP may be helpful to completely exclude distal CBD stone. Electronically Signed   By: Charlett Nose M.D.   On: 03/30/2023 20:05   CT CHEST ABDOMEN PELVIS WO CONTRAST  Result Date: 03/30/2023 CLINICAL DATA:  Sepsis, history of melanoma EXAM: CT CHEST, ABDOMEN AND PELVIS WITHOUT CONTRAST TECHNIQUE: Multidetector CT imaging of the chest, abdomen and pelvis was performed following the standard protocol without IV contrast. RADIATION DOSE REDUCTION: This exam was performed according to the departmental dose-optimization program which includes automated exposure control, adjustment of the mA and/or kV according to patient size and/or use of iterative reconstruction technique. COMPARISON:  01/01/2023,  05/27/2021 FINDINGS: CT CHEST FINDINGS Cardiovascular: Unenhanced imaging of  the heart is unremarkable without pericardial effusion. Calcifications are seen within the mitral and aortic valves. Normal caliber of the thoracic aorta. Atherosclerosis of the aorta and coronary vasculature. Right chest wall port via internal jugular approach tip within the superior vena cava. Mediastinum/Nodes: No enlarged mediastinal, hilar, or axillary lymph nodes. Thyroid gland, trachea, and esophagus demonstrate no significant findings. Lungs/Pleura: No acute airspace disease, effusion, or pneumothorax. Central airways are patent. Musculoskeletal: Prior healed anterior right rib fractures. No acute or destructive bony abnormalities. Reconstructed images demonstrate no additional findings. CT ABDOMEN PELVIS FINDINGS Hepatobiliary: Cholelithiasis without evidence of acute cholecystitis. There is mild intrahepatic biliary duct dilation, with prominent dilation of the common bile duct measuring up to 14 mm, increased since prior exams. No evidence of choledocholithiasis. Pancreas: Diffuse pancreatic atrophy. No acute inflammatory changes. Spleen: Splenomegaly, measuring 14.1 x 7.1 x 10.0 cm. No focal parenchymal abnormality on this unenhanced exam. Adrenals/Urinary Tract: No urinary tract calculi or obstructive uropathy. The adrenals and bladder are unremarkable. Stomach/Bowel: No bowel obstruction or ileus. Distal colonic diverticulosis without evidence of diverticulitis. Normal appendix right lower quadrant. No bowel wall thickening or inflammatory change. Vascular/Lymphatic: Progressive metastatic adenopathy in the right lower pelvis and inguinal regions. Index lymph nodes are as follows: Right inguinal, image 110/2, 21 mm short axis. Deep right inguinal, image 94/2, 35 mm short axis. Right external iliac, image 98/2, 27 mm short axis. No other pathologic adenopathy. Diffuse atherosclerosis of the aorta and its branches. Reproductive:  Uterus and bilateral adnexa are unremarkable. Other: No free fluid or free intraperitoneal gas. No abdominal wall hernia. Stable presacral fat containing mass measures 7.2 x 6.5 cm, previously characterized as a myelolipoma based on imaging characteristics and lack of metabolic activity. Musculoskeletal: No acute or destructive bony abnormalities. Reconstructed images demonstrate no additional findings. IMPRESSION: 1. Progressive metastatic adenopathy in the right external iliac and inguinal regions, consistent with worsening metastatic melanoma. 2. Progressive intrahepatic and extrahepatic biliary duct dilation, of uncertain etiology or significance. No evidence of choledocholithiasis. 3. Cholelithiasis without cholecystitis. 4. Splenomegaly. 5. 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 6. Distal colonic diverticulosis without diverticulitis. 7. Aortic Atherosclerosis (ICD10-I70.0). Coronary artery atherosclerosis. Electronically Signed   By: Sharlet Salina M.D.   On: 03/30/2023 18:00   CT Head Wo Contrast  Result Date: 03/30/2023 CLINICAL DATA:  Syncope/presyncope. Possible stroke. Possible sepsis. EXAM: CT HEAD WITHOUT CONTRAST TECHNIQUE: Contiguous axial images were obtained from the base of the skull through the vertex without intravenous contrast. RADIATION DOSE REDUCTION: This exam was performed according to the departmental dose-optimization program which includes automated exposure control, adjustment of the mA and/or kV according to patient size and/or use of iterative reconstruction technique. COMPARISON:  05/26/2021 FINDINGS: Brain: Age related volume loss. No evidence of old or acute focal infarction, mass lesion, hemorrhage, hydrocephalus or extra-axial collection. Vascular: No abnormal vascular finding. Skull: Normal.  No fracture or focal lesion. Sinuses/Orbits: Clear/normal Other: None IMPRESSION: No  acute or reversible finding. Age related volume loss. Electronically Signed   By: Paulina Fusi M.D.   On: 03/30/2023 16:07       Sargon Scouten M.D. [facility] 04/01/2023, 1:37 PM  Available via Epic secure chat 7am-7pm After 7 pm, please refer to night coverage provider listed on amion.

## 2023-04-01 NOTE — Consult Note (Addendum)
Attending physician's note   I have taken a history, reviewed the chart, and examined the patient. I performed a substantive portion of this encounter, including complete performance of at least one of the key components, in conjunction with the APP. I agree with the APP's note, impression, and recommendations with my edits.   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 coli bacteremia.  Was initially admitted to the ICU, started on broad-spectrum ABX (now narrowed to ceftriaxone) and was briefly on vasopressors (discontinued yesterday).  Admission CT C/A/P with cholelithiasis without cholecystitis, dilated intra/extrahepatic ducts with CBD 14 mm but no choledocholithiasis.  CT also notable for metastatic adenopathy in the pelvis.  Abdominal ultrasound also with cholelithiasis without cholecystitis, and 10 mm CBD, and again no CDL.  Liver enzymes still mildly uptrending as an outpatient starting on 10/30 and repeat AST/ALT/ALP 99/40/264 on 11/13.  T. bili was normal at 1.1.  1) Elevated liver enzymes 2) Elevated alkaline phosphatase 3) Elevated bilirubin-improving 4) Dilated bile duct 5) E. coli bacteremia/sepsis 6) Leukocytosis-improving  - Plan for MRCP (open scanner due to claustrophobia) for evaluation of bile duct with potential ERCP pending results - Continue antimicrobial therapy per primary Hospital service - Continue trending daily liver enzymes - N.p.o. midnight - GI service will continue to follow  The indications, risks, and benefits of ERCP were explained to the patient in detail. Risks include but are not limited to bleeding, perforation, adverse reaction to medications, pancreatitis, inability to cannulate duct, and cardiopulmonary compromise. Sequelae include but are not limited to  the possibility of surgery, prolonged hospitalization, and mortality.  All questions answered to the best of my ability.   [address], DO, FACG [phone] office         Consultation  Referring Provider: No ref. provider found Primary Care Physician:  [name], MD Primary Gastroenterologist:  unassigned  Reason for Consultation: Admitted with sepsis, E. coli bacteremia elevated LFTs and abnormal abdominal imaging concerning for biliary obstruction  HPI: Elizabeth Norman is a 82 y.o. female, who was admitted 3 days ago after she presented to the emergency room with complaints of dizziness, some shortness of breath and chills. Workup revealed WBC of 21.6/hemoglobin 11.4 Lactate 7.5 T. bili 6.0/alk phos 282/AST 136/ALT 77 Troponin 46 INR 1.3.  She was started on empiric antibiotics, blood cultures were ordered. Blood cultures have returned positive for E. coli, and she continues on IV antibiotics. Noncontrasted CT scan was done yesterday and she was noted to have gallstones and a common bile duct of 14 mm without stone evident in the duct, also with splenomegaly and progressive adenopathy in the right lower pelvis and inguinal region secondary to known metastatic melanoma.  Labs today WBC 12.0/hemoglobin 9.8/ crit 20.3 T. bili 1.6/alk phos 216/AST 67/ALT 57  Patient says she is feeling much better, she never had any abdominal pain prior to being admitted and has not had any abdominal pain here.  She is currently able to eat without difficulty.  She was hoping to be discharged tomorrow.  Does have history of metastatic melanoma and has been on immune therapy with infusions every 3 weeks, followed by Dr. Ellin Saba.  Also with history of coronary artery disease, hypertension and hypothyroidism.  Her last PET scan was done in August 2024 and found to have right femoral osseous  metastatic disease and new right pelvic nodal mets.   Past Medical History:  Diagnosis Date    Arthritis    Hypertension    Malignant melanoma of lower leg, right (HCC) 03/27/2021   Port-A-Cath in place 04/30/2021    Past Surgical History:  Procedure Laterality Date   CATARACT EXTRACTION W/PHACO Right 10/03/2014   Procedure: CATARACT EXTRACTION PHACO AND INTRAOCULAR LENS PLACEMENT (IOC);  Surgeon: Jethro Bolus, MD;  Location: [facility];  Service: Ophthalmology;  Laterality: Right;  CDE:10.09   CATARACT EXTRACTION W/PHACO Left 10/10/2014   Procedure: CATARACT EXTRACTION PHACO AND INTRAOCULAR LENS PLACEMENT (IOC);  Surgeon: Jethro Bolus, MD;  Location: [facility];  Service: Ophthalmology;  Laterality: Left;  CDE:8.69   CORNEAL 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   CORNEAL TRANSPLANT Left 2020   DENTAL RESTORATION/EXTRACTION WITH X-RAY     IR IMAGING GUIDED PORT INSERTION  04/05/2021   LEFT HEART CATH AND CORONARY ANGIOGRAPHY N/A 05/28/2021   Procedure: LEFT HEART CATH AND CORONARY ANGIOGRAPHY;  Surgeon: [name], MD;  Location: [facility];  Service: Cardiovascular;  Laterality: N/A;   MELANOMA EXCISION Right 02/12/2021   Procedure: WIDE LOCAL EXCISION RIGHT LOWER LEG MELANOMA , ADVANCEMENT FLAP CLOSURE DEFECT;  Surgeon: Almond Lint, MD;  Location: [facility];  Service: General;  Laterality: Right;   SENTINEL NODE BIOPSY Right 02/12/2021   Procedure: SENTINEL NODE BIOPSY;  Surgeon: Almond Lint, MD;  Location: [facility];  Service: General;  Laterality: Right;    Prior to Admission medications   Medication Sig Start Date End Date Taking? Authorizing Provider  ALPRAZolam Prudy Feeler) 0.5 MG tablet Take 0.5 mg by mouth 3 (three) times daily. 08/22/14  Yes [provider]  calcium carbonate (OS-CAL - DOSED IN MG OF ELEMENTAL CALCIUM) 1250 (500 CA) MG tablet Take 1 tablet by mouth 3 (three) times a week.   Yes [provider]  cholecalciferol (VITAMIN D) 1000 UNITS tablet Take 1,000 Units by mouth 3 (three) times a week.   Yes [provider]  Cyanocobalamin (VITAMIN B-12  PO) Take 1 tablet by mouth once a week.   Yes [provider]  famotidine (PEPCID) 40 MG tablet Take 40 mg by mouth daily. 03/13/23  Yes [provider]  levothyroxine (SYNTHROID) 137 MCG tablet Take 1 tablet (137 mcg total) by mouth daily before breakfast. 12/17/22  Yes [name], MD  metoprolol tartrate (LOPRESSOR) 25 MG tablet Take 25 mg by mouth 2 (two) times daily. 03/28/22  Yes [provider]  omeprazole (PRILOSEC) 40 MG capsule Take 40 mg by mouth daily. 12/08/22  Yes [provider]  prednisoLONE acetate (PRED FORTE) 1 % ophthalmic suspension Place 1 drop into both eyes daily. Instill one drop into the right eye twice daily and one drop into the left eye once daily. 12/18/20  Yes [provider]    Current Facility-Administered Medications  Medication Dose Route Frequency Provider Last Rate Last Admin   ALPRAZolam (XANAX) tablet 0.25 mg  0.25 mg Oral TID PRN Paliwal, Aditya, MD   0.25 mg at 03/31/23 2255   cefTRIAXone (ROCEPHIN) 2 g in sodium chloride 0.9 % 100 mL IVPB  2 g Intravenous Q24H Pham, Minh Q, RPH-CPP 200 mL/hr at 04/01/23 1150 2 g at 04/01/23 1150   Chlorhexidine Gluconate Cloth 2 % PADS 6 each  6 each Topical Daily Lorin Glass, MD   6 each at 04/01/23 1152   docusate sodium (COLACE) capsule 100 mg  100 mg Oral BID PRN Cloyd Stagers  M, PA-C       heparin injection 5,000 Units  5,000 Units Subcutaneous Q8H Cloyd Stagers M, [place]   5,000 Units at 04/01/23 0160   [START ON 04/02/2023] influenza vaccine adjuvanted (FLUAD) injection 0.5 mL  0.5 mL Intramuscular Tomorrow-1000 Rai, Ripudeep K, MD       [START ON 04/02/2023] levothyroxine (SYNTHROID) tablet 125 mcg  125 mcg Oral Q0600 Rai, Delene Ruffini, MD       Oral care mouth rinse  15 mL Mouth Rinse PRN Lorin Glass, MD       pantoprazole (PROTONIX) EC tablet 40 mg  40 mg Oral Daily Charlott Holler, MD   40 mg at 04/01/23 1093   polyethylene glycol (MIRALAX / GLYCOLAX)  packet 17 g  17 g Oral Daily PRN Cloyd Stagers M, PA-C       prednisoLONE acetate (PRED FORTE) 1 % ophthalmic suspension 1 drop  1 drop Both Eyes Q1200 Charlott Holler, MD   1 drop at 04/01/23 1153   sodium chloride flush (NS) 0.9 % injection 10-40 mL  10-40 mL Intracatheter Q12H Rai, Ripudeep K, MD   10 mL at 04/01/23 1152   sodium chloride flush (NS) 0.9 % injection 10-40 mL  10-40 mL Intracatheter PRN Rai, Delene Ruffini, MD        Allergies as of 03/30/2023 - Review Complete 03/30/2023  Allergen Reaction Noted   Tape  01/29/2021    Family History  Problem Relation Age of Onset   Cancer Father    Hypertension Sister    Hypertension Sister     Social History   Socioeconomic History   Marital status: Widowed    Spouse name: Not on file   Number of children: 3   Years of education: Not on file   Highest education level: Not on file  Occupational History   Not on file  Tobacco Use   Smoking status: Never   Smokeless tobacco: Never  Vaping Use   Vaping status: Never Used  Substance and Sexual Activity   Alcohol use: No   Drug use: No   Sexual activity: Not on file  Other Topics Concern   Not on file  Social History Narrative   Not on file   Social Determinants of Health   Financial Resource Strain: Not on file  Food Insecurity: No Food Insecurity (04/01/2023)   Hunger Vital Sign    Worried About Running Out of Food in the Last Year: Never true    Ran Out of Food in the Last Year: Never true  Transportation Needs: No Transportation Needs (04/01/2023)   PRAPARE - Administrator, Civil Service (Medical): No    Lack of Transportation (Non-Medical): No  Physical Activity: Not on file  Stress: Not on file  Social Connections: Not on file  Intimate Partner Violence: Not At Risk (04/01/2023)   Humiliation, Afraid, Rape, and Kick questionnaire    Fear of Current or Ex-Partner: No    Emotionally Abused: No    Physically Abused: No    Sexually Abused: No     Review of Systems: Pertinent positive and negative review of systems were noted in the above HPI section.  All other review of systems was otherwise negative.   Physical Exam: Vital signs in last 24 hours: Temp:  [97.5 F (36.4 C)-99.9 F (37.7 C)] 97.5 F (36.4 C) (11/20 0752) Pulse Rate:  [93-110] 93 (11/20 0752) Resp:  [14-27] 18 (11/20 0752) BP: (106-145)/(48-93) 115/93 (11/20 0752)  SpO2:  [95 %-100 %] 96 % (11/20 0752) Last BM Date : 03/31/23 General:   Alert,  Well-developed, well-nourished, elderly white female pleasant and cooperative in NAD Head:  Normocephalic and atraumatic. Eyes:  Sclera no clear icterus ,conjunctiva pink. Ears:  Normal auditory acuity. Nose:  No deformity, discharge,  or lesions. Mouth:  No deformity or lesions.   Neck:  Supple; no masses or thyromegaly. Lungs:  Clear throughout to auscultation.   No wheezes, crackles, or rhonchi.  Heart:  Regular rate and rhythm; no murmurs, clicks, rubs,  or gallops. Abdomen:  Soft,nontender, BS active,nonpalp mass or hsm.   Rectal: Not done Msk:  Symmetrical without gross deformities. . Pulses:  Normal pulses noted. Extremities:  Without clubbing or edema. Neurologic:  Alert and  oriented x4;  grossly normal neurologically. Skin:  Intact without significant lesions or rashes.. Psych:  Alert and cooperative. Normal mood and affect.  Intake/Output from previous day: 11/19 0701 - 11/20 0700 In: 611.2 [I.V.:398.1; IV Piggyback:213] Out: 300 [Urine:300] Intake/Output this shift: Total I/O In: 240 [P.O.:240] Out: -   Lab Results: Recent Labs    03/30/23 2229 03/31/23 0150 04/01/23 0700  WBC 18.8* 29.0* 12.0*  HGB 10.2* 11.3* 9.8*  HCT 32.0* 34.8* 30.3*  PLT 138* 179 135*   BMET Recent Labs    03/30/23 2229 03/31/23 0150 04/01/23 0700  NA 130* 132* 134*  K 3.8 3.7 4.2  CL 95* 98 103  CO2 24 23 26   GLUCOSE 274* 96 122*  BUN 18 19 15   CREATININE 1.36* 1.10* 0.80  CALCIUM 8.2* 8.6* 8.1*    LFT Recent Labs    04/01/23 0700  PROT 4.9*  ALBUMIN 1.9*  AST 67*  ALT 57*  ALKPHOS 216*  BILITOT 1.6*   PT/INR Recent Labs    03/30/23 1236  LABPROT 16.3*  INR 1.3*   Hepatitis Panel No results for input(s): "HEPBSAG", "HCVAB", "HEPAIGM", "HEPBIGM" in the last 72 hours.   IMPRESSION:  #59 82 year old female with known metastatic melanoma, on chemotherapy/immunotherapy.  Most recent pet imaging August 2024 had shown progression of disease with right femoral osseous metastatic disease and new right pelvic nodal mets. Admitted 3 days ago with chills, dyspnea and dizziness and met sepsis criteria. Labs showed elevated LFTs with T. bili of 6.  Note that her LFTs had not been completely normal on 03/25/2023.  Blood cultures returned positive for E. Coli  She has improved and underwent noncontrasted imaging yesterday revealing cholelithiasis, dilated common bile duct without evidence of common bile duct stone, splenomegaly and progressive adenopathy in the right lower pelvis and inguinal region  Presumably she has had a biliary sepsis, unclear whether this could be secondary to benign disease/small distal common bile duct stone versus possibly malignant stricture secondary to progression of her known metastatic melanoma  #2 acute kidney injury secondary to sepsis improved #3 coronary artery disease #4 anemia, mild thrombocytopenia  Plan; continue IV ceftriaxone Have ordered MRI/MRCP to be done today. N.p.o. after midnight in event patient may need ERCP tomorrow  Repeat labs in a.m.  Plans were discussed with the patient, she is agreeable to MRCP only if it can be in a semi-open situation  GI will follow with you.      Amy Esterwood PA-C 04/01/2023, 2:18 PM

## 2023-04-01 NOTE — Progress Notes (Signed)
Positive for e.coli bacteremia. Ok to optimize abx to ceftriaxone 2g IV q24 per Dr. Isidoro Donning.  Ulyses Southward, PharmD, BCIDP, AAHIVP, CPP Infectious Disease Pharmacist 04/01/2023 10:00 AM

## 2023-04-01 NOTE — Progress Notes (Signed)
PHARMACY - PHYSICIAN COMMUNICATION CRITICAL VALUE ALERT - BLOOD CULTURE IDENTIFICATION (BCID)  Elizabeth Norman is an 82 y.o. female who presented to [facility] on 03/30/2023 with a chief complaint of sepsis  Name of physician (or Provider) Contacted: Dr. Delia Chimes  Current antibiotics: Cefepime, Flagyl  Changes to prescribed antibiotics recommended:  No changes for now  Results for orders placed or performed during the hospital encounter of 03/30/23  Blood Culture ID Panel (Reflexed) (Collected: 03/30/2023 12:36 PM)  Result Value Ref Range   Enterococcus faecalis NOT DETECTED NOT DETECTED   Enterococcus Faecium NOT DETECTED NOT DETECTED   Listeria monocytogenes NOT DETECTED NOT DETECTED   Staphylococcus species NOT DETECTED NOT DETECTED   Staphylococcus aureus (BCID) NOT DETECTED NOT DETECTED   Staphylococcus epidermidis NOT DETECTED NOT DETECTED   Staphylococcus lugdunensis NOT DETECTED NOT DETECTED   Streptococcus species NOT DETECTED NOT DETECTED   Streptococcus agalactiae NOT DETECTED NOT DETECTED   Streptococcus pneumoniae NOT DETECTED NOT DETECTED   Streptococcus pyogenes NOT DETECTED NOT DETECTED   A.calcoaceticus-baumannii NOT DETECTED NOT DETECTED   Bacteroides fragilis NOT DETECTED NOT DETECTED   Enterobacterales DETECTED (A) NOT DETECTED   Enterobacter cloacae complex NOT DETECTED NOT DETECTED   Escherichia coli DETECTED (A) NOT DETECTED   Klebsiella aerogenes NOT DETECTED NOT DETECTED   Klebsiella oxytoca NOT DETECTED NOT DETECTED   Klebsiella pneumoniae NOT DETECTED NOT DETECTED   Proteus species NOT DETECTED NOT DETECTED   Salmonella species NOT DETECTED NOT DETECTED   Serratia marcescens NOT DETECTED NOT DETECTED   Haemophilus influenzae NOT DETECTED NOT DETECTED   Neisseria meningitidis NOT DETECTED NOT DETECTED   Pseudomonas aeruginosa NOT DETECTED NOT DETECTED   Stenotrophomonas maltophilia NOT DETECTED NOT DETECTED   Candida albicans NOT DETECTED NOT DETECTED    Candida auris NOT DETECTED NOT DETECTED   Candida glabrata NOT DETECTED NOT DETECTED   Candida krusei NOT DETECTED NOT DETECTED   Candida parapsilosis NOT DETECTED NOT DETECTED   Candida tropicalis NOT DETECTED NOT DETECTED   Cryptococcus neoformans/gattii NOT DETECTED NOT DETECTED   CTX-M ESBL NOT DETECTED NOT DETECTED   Carbapenem resistance IMP NOT DETECTED NOT DETECTED   Carbapenem resistance KPC NOT DETECTED NOT DETECTED   Carbapenem resistance NDM NOT DETECTED NOT DETECTED   Carbapenem resist OXA 48 LIKE NOT DETECTED NOT DETECTED   Carbapenem resistance VIM NOT DETECTED NOT DETECTED    Abran Duke 04/01/2023  12:48 AM

## 2023-04-01 NOTE — Plan of Care (Signed)

## 2023-04-02 ENCOUNTER — Inpatient Hospital Stay (HOSPITAL_COMMUNITY): Payer: Medicare Other

## 2023-04-02 ENCOUNTER — Encounter (HOSPITAL_COMMUNITY): Payer: Self-pay | Admitting: Critical Care Medicine

## 2023-04-02 ENCOUNTER — Encounter (HOSPITAL_COMMUNITY)
Admission: EM | Disposition: A | Discharge status: Home / Self Care | Payer: Self-pay | Source: Home / Self Care | Attending: Internal Medicine

## 2023-04-02 DIAGNOSIS — B9620 Unspecified Escherichia coli [E. coli] as the cause of diseases classified elsewhere: Secondary | ICD-10-CM

## 2023-04-02 DIAGNOSIS — K3189 Other diseases of stomach and duodenum: Secondary | ICD-10-CM | POA: Diagnosis not present

## 2023-04-02 DIAGNOSIS — R7881 Bacteremia: Secondary | ICD-10-CM

## 2023-04-02 DIAGNOSIS — K8030 Calculus of bile duct with cholangitis, unspecified, without obstruction: Secondary | ICD-10-CM | POA: Diagnosis not present

## 2023-04-02 DIAGNOSIS — K8020 Calculus of gallbladder without cholecystitis without obstruction: Secondary | ICD-10-CM | POA: Diagnosis not present

## 2023-04-02 DIAGNOSIS — R933 Abnormal findings on diagnostic imaging of other parts of digestive tract: Secondary | ICD-10-CM

## 2023-04-02 DIAGNOSIS — E039 Hypothyroidism, unspecified: Secondary | ICD-10-CM | POA: Diagnosis not present

## 2023-04-02 DIAGNOSIS — K2940 Chronic atrophic gastritis without bleeding: Secondary | ICD-10-CM | POA: Diagnosis not present

## 2023-04-02 DIAGNOSIS — K260 Acute duodenal ulcer with hemorrhage: Secondary | ICD-10-CM

## 2023-04-02 DIAGNOSIS — R7989 Other specified abnormal findings of blood chemistry: Secondary | ICD-10-CM

## 2023-04-02 DIAGNOSIS — N179 Acute kidney failure, unspecified: Secondary | ICD-10-CM | POA: Diagnosis not present

## 2023-04-02 DIAGNOSIS — K8033 Calculus of bile duct with acute cholangitis with obstruction: Secondary | ICD-10-CM

## 2023-04-02 DIAGNOSIS — R579 Shock, unspecified: Secondary | ICD-10-CM | POA: Diagnosis not present

## 2023-04-02 DIAGNOSIS — C4371 Malignant melanoma of right lower limb, including hip: Secondary | ICD-10-CM

## 2023-04-02 HISTORY — PX: BIOPSY: SHX5522

## 2023-04-02 HISTORY — PX: SPHINCTEROTOMY: SHX5279

## 2023-04-02 HISTORY — PX: ERCP: SHX5425

## 2023-04-02 HISTORY — PX: SCLEROTHERAPY: SHX6841

## 2023-04-02 HISTORY — PX: REMOVAL OF STONES: SHX5545

## 2023-04-02 LAB — CBC
HCT: 28.6 % — ABNORMAL LOW (ref 36.0–46.0)
Hemoglobin: 9 g/dL — ABNORMAL LOW (ref 12.0–15.0)
MCH: 27.7 pg (ref 26.0–34.0)
MCHC: 31.5 g/dL (ref 30.0–36.0)
MCV: 88 fL (ref 80.0–100.0)
Platelets: 116 10*3/uL — ABNORMAL LOW (ref 150–400)
RBC: 3.25 MIL/uL — ABNORMAL LOW (ref 3.87–5.11)
RDW: 14.1 % (ref 11.5–15.5)
WBC: 6.5 10*3/uL (ref 4.0–10.5)
nRBC: 0 % (ref 0.0–0.2)

## 2023-04-02 LAB — COMPREHENSIVE METABOLIC PANEL
ALT: 41 U/L (ref 0–44)
AST: 38 U/L (ref 15–41)
Albumin: 1.9 g/dL — ABNORMAL LOW (ref 3.5–5.0)
Alkaline Phosphatase: 197 U/L — ABNORMAL HIGH (ref 38–126)
Anion gap: 7 (ref 5–15)
BUN: 15 mg/dL (ref 8–23)
CO2: 25 mmol/L (ref 22–32)
Calcium: 7.8 mg/dL — ABNORMAL LOW (ref 8.9–10.3)
Chloride: 101 mmol/L (ref 98–111)
Creatinine, Ser: 0.78 mg/dL (ref 0.44–1.00)
GFR, Estimated: >60 mL/min (ref >60)
Glucose, Bld: 99 mg/dL (ref 70–99)
Potassium: 4.3 mmol/L (ref 3.5–5.1)
Sodium: 133 mmol/L — ABNORMAL LOW (ref 135–145)
Total Bilirubin: 1.2 mg/dL — ABNORMAL HIGH (ref <1.2)
Total Protein: 4.7 g/dL — ABNORMAL LOW (ref 6.5–8.1)

## 2023-04-02 LAB — CULTURE, BLOOD (ROUTINE X 2): Special Requests: ADEQUATE

## 2023-04-02 LAB — PROTIME-INR
INR: 1.1 (ref 0.8–1.2)
Prothrombin Time: 14.4 s (ref 11.4–15.2)

## 2023-04-02 SURGERY — ERCP, WITH INTERVENTION IF INDICATED
Anesthesia: General

## 2023-04-02 MED ORDER — ONDANSETRON HCL 4 MG/2ML IJ SOLN
INTRAMUSCULAR | Status: DC | PRN
  Administered 2023-04-02: 4 mg via INTRAVENOUS

## 2023-04-02 MED ORDER — PHENYLEPHRINE 80 MCG/ML (10ML) SYRINGE FOR IV PUSH (FOR BLOOD PRESSURE SUPPORT)
PREFILLED_SYRINGE | INTRAVENOUS | Status: DC | PRN
  Administered 2023-04-02: 160 ug via INTRAVENOUS
  Administered 2023-04-02: 80 ug via INTRAVENOUS

## 2023-04-02 MED ORDER — GLUCAGON HCL RDNA (DIAGNOSTIC) 1 MG IJ SOLR
INTRAMUSCULAR | Status: AC
  Filled 2023-04-02: qty 1

## 2023-04-02 MED ORDER — SODIUM CHLORIDE 0.9 % IV SOLN
INTRAVENOUS | Status: DC | PRN
  Administered 2023-04-02: 25 mL

## 2023-04-02 MED ORDER — DICLOFENAC SUPPOSITORY 100 MG
RECTAL | Status: AC
  Filled 2023-04-02: qty 1

## 2023-04-02 MED ORDER — FENTANYL CITRATE (PF) 250 MCG/5ML IJ SOLN
INTRAMUSCULAR | Status: DC | PRN
  Administered 2023-04-02: 100 ug via INTRAVENOUS

## 2023-04-02 MED ORDER — GLUCAGON HCL RDNA (DIAGNOSTIC) 1 MG IJ SOLR
INTRAMUSCULAR | Status: DC | PRN
  Administered 2023-04-02 (×2): .25 mg via INTRAVENOUS

## 2023-04-02 MED ORDER — DICLOFENAC SUPPOSITORY 100 MG: 100.0000 mg | Freq: Once | RECTAL | Status: DC

## 2023-04-02 MED ORDER — DICLOFENAC SUPPOSITORY 100 MG
RECTAL | Status: DC | PRN
  Administered 2023-04-02: 100 mg via RECTAL

## 2023-04-02 MED ORDER — SODIUM CHLORIDE (PF) 0.9 % IJ SOLN
PREFILLED_SYRINGE | INTRAMUSCULAR | Status: DC | PRN
  Administered 2023-04-02: 3 mL

## 2023-04-02 MED ORDER — SUGAMMADEX SODIUM 200 MG/2ML IV SOLN
INTRAVENOUS | Status: DC | PRN
  Administered 2023-04-02: 200 mg via INTRAVENOUS

## 2023-04-02 MED ORDER — SODIUM CHLORIDE 0.9 % IV SOLN
2.0000 g | INTRAVENOUS | Status: DC
  Administered 2023-04-02 – 2023-04-03 (×2): 2 g via INTRAVENOUS
  Filled 2023-04-02 (×2): qty 20

## 2023-04-02 MED ORDER — EPINEPHRINE 1 MG/10ML IJ SOSY
PREFILLED_SYRINGE | INTRAMUSCULAR | Status: AC
Start: 2023-04-02 — End: ?
  Filled 2023-04-02: qty 10

## 2023-04-02 MED ORDER — PROPOFOL 10 MG/ML IV BOLUS
INTRAVENOUS | Status: DC | PRN
  Administered 2023-04-02: 100 mg via INTRAVENOUS

## 2023-04-02 MED ORDER — FENTANYL CITRATE (PF) 100 MCG/2ML IJ SOLN
INTRAMUSCULAR | Status: AC
  Filled 2023-04-02: qty 2

## 2023-04-02 MED ORDER — DEXAMETHASONE SODIUM PHOSPHATE 10 MG/ML IJ SOLN
INTRAMUSCULAR | Status: DC | PRN
  Administered 2023-04-02: 5 mg via INTRAVENOUS

## 2023-04-02 MED ORDER — SUCCINYLCHOLINE CHLORIDE 200 MG/10ML IV SOSY
PREFILLED_SYRINGE | INTRAVENOUS | Status: DC | PRN
  Administered 2023-04-02: 100 mg via INTRAVENOUS

## 2023-04-02 MED ORDER — LIDOCAINE 2% (20 MG/ML) 5 ML SYRINGE
INTRAMUSCULAR | Status: DC | PRN
  Administered 2023-04-02: 80 mg via INTRAVENOUS

## 2023-04-02 MED ORDER — GLUCAGON HCL RDNA (DIAGNOSTIC) 1 MG IJ SOLR
INTRAMUSCULAR | Status: AC
Start: 2023-04-02 — End: ?
  Filled 2023-04-02: qty 1

## 2023-04-02 MED ORDER — ROCURONIUM BROMIDE 10 MG/ML (PF) SYRINGE
PREFILLED_SYRINGE | INTRAVENOUS | Status: DC | PRN
  Administered 2023-04-02: 30 mg via INTRAVENOUS

## 2023-04-02 MED ORDER — SODIUM CHLORIDE 0.9 % IV SOLN
INTRAVENOUS | Status: DC | PRN
Start: 2023-04-02 — End: 2023-04-02

## 2023-04-02 MED ORDER — KETOROLAC TROMETHAMINE 15 MG/ML IJ SOLN
15.0000 mg | Freq: Once | INTRAMUSCULAR | Status: AC | PRN
  Administered 2023-04-02: 15 mg via INTRAVENOUS
  Filled 2023-04-02: qty 1

## 2023-04-02 NOTE — H&P (View-Only) (Signed)
Patient ID: KINLI WOLTZ, female   DOB: 02-Mar-1941, 82 y.o.   MRN: [id number]    Progress Note   Subjective   Day # 3 CC; sepsis, E. coli bacteremia, elevated LFTs  IV Rocephin  MRI/MRCP last p.m.-impacted stone at the ampulla measuring 8 mm associated with intra and extrahepatic bile duct dilation, cholelithiasis, diffuse hepatic steatosis query early cirrhosis, mild splenomegaly  Labs today-WBC 6.5/hemoglobin 9/hematocrit/28.6/platelets 116 Sodium 133/potassium 4.3/BUN 15/creatinine 0.78 T. bili 1.2/alk phos 197/AST 38/ALT 41  Patient says she feels fine, she has been afebrile, no nausea or vomiting and no complaints of abdominal pain.   Objective   Vital signs in last 24 hours: Temp:  [97.5 F (36.4 C)-99 F (37.2 C)] 98.4 F (36.9 C) (11/21 0445) Pulse Rate:  [81-95] 81 (11/21 0824) Resp:  [17-18] 17 (11/21 0445) BP: (129-138)/(63-69) 133/67 (11/21 0824) SpO2:  [96 %-98 %] 96 % (11/21 0824) Weight:  [75.3 kg] 75.3 kg (11/21 0445) Last BM Date : 04/01/23 General: Elderly white female in NAD Heart:  Regular rate and rhythm; no murmurs Lungs: Respirations even and unlabored, lungs CTA bilaterally Abdomen:  Soft, nontender and nondistended. Normal bowel sounds. Extremities:  Without edema. Neurologic:  Alert and oriented,  grossly normal neurologically. Psych:  Cooperative. Normal mood and affect.  Intake/Output from previous day: 11/20 0701 - 11/21 0700 In: 240 [P.O.:240] Out: 1100 [Urine:1100] Intake/Output this shift: No intake/output data recorded.  Lab Results: Recent Labs    03/31/23 0150 04/01/23 0700 04/02/23 0236  WBC 29.0* 12.0* 6.5  HGB 11.3* 9.8* 9.0*  HCT 34.8* 30.3* 28.6*  PLT 179 135* 116*   BMET Recent Labs    03/31/23 0150 04/01/23 0700 04/02/23 0236  NA 132* 134* 133*  K 3.7 4.2 4.3  CL 98 103 101  CO2 23 26 25   GLUCOSE 96 122* 99  BUN 19 15 15   CREATININE 1.10* 0.80 0.78  CALCIUM 8.6* 8.1* 7.8*   LFT Recent Labs     04/02/23 0236  PROT 4.7*  ALBUMIN 1.9*  AST 38  ALT 41  ALKPHOS 197*  BILITOT 1.2*   PT/INR Recent Labs    03/30/23 1236 04/01/23 2353  LABPROT 16.3* 14.4  INR 1.3* 1.1    Studies/Results: MR ABDOMEN MRCP W WO CONTAST  Result Date: 04/02/2023 CLINICAL DATA:  Jaundice.  History of melanoma. EXAM: MRI ABDOMEN WITHOUT AND WITH CONTRAST (INCLUDING MRCP) TECHNIQUE: Multiplanar multisequence MR imaging of the abdomen was performed both before and after the administration of intravenous contrast. Heavily T2-weighted images of the biliary and pancreatic ducts were obtained, and three-dimensional MRCP images were rendered by post processing. CONTRAST:  8mL GADAVIST GADOBUTROL 1 MMOL/ML IV SOLN COMPARISON:  CT chest, abdomen and pelvis 03/30/2023 FINDINGS: Lower chest: Trace bilateral pleural effusions. Hepatobiliary: There is diffuse hepatic steatosis with relative increased fatty deposition involving the left lobe compared with the right. No focal enhancing liver lesions. There is relative hypertrophy of the lateral segment of left hepatic lobe. Capsular retraction with underlying parenchymal scarring identified within segment 7 of the liver. No discrete enhancing liver lesions identified. Stones identified within the dependent portion of the gallbladder measuring up to 0.9 cm. The gallbladder wall thickness measures 3 mm. No significant pericholecystic fluid. Common bile duct dilatation is identified with moderate increased intrahepatic bile duct dilatation. The CBD measures up to 1.5 cm. Sludge noted within the distal half of the common bile duct. There is an impacted stone at the ampulla measuring 8 mm, image 15/10  and image 34/5. Pancreas: No mass, inflammatory changes, or other parenchymal abnormality identified. Spleen: The spleen measures 11.7 x 5.6 by 12.1 cm. (Volume = 420 cm^3) Adrenals/Urinary Tract: Normal appearance of the adrenal glands. No suspicious mass or hydronephrosis. Small left  kidney cysts are noted. The largest measures 7 mm. No specific follow-up imaging recommended. Stomach/Bowel: Stomach is normal. No dilated loops of large or small bowel. Vascular/Lymphatic: Aortic atherosclerosis. Periportal lymph node measures 1.2 cm, image 78/1102. Other: No ascites. Body wall edema is noted extending along both flanks. Musculoskeletal: No suspicious bone lesions identified. IMPRESSION: 1. Impacted stone at the ampulla measuring 8 mm with associated intra and extrahepatic bile duct dilatation. 2. Cholelithiasis. 3. Diffuse hepatic steatosis with relative increased fatty deposition involving the left lobe compared with the right. There is relative hypertrophy of the lateral segment of left hepatic lobe. Capsular retraction with underlying parenchymal scarring identified within segment 7 of the liver. Imaging findings may be seen in the setting of early cirrhosis. 4. Trace bilateral pleural effusions. 5. Body wall edema extending along both flanks. 6. Mild splenomegaly. 7. Aortic Atherosclerosis (ICD10-I70.0). These results will be called to the ordering clinician or representative by the Radiologist Assistant, and communication documented in the PACS or [employer]. Electronically Signed   By: Signa Kell M.D.   On: 04/02/2023 05:25   MR 3D Recon At Scanner  Result Date: 04/02/2023 CLINICAL DATA:  Jaundice.  History of melanoma. EXAM: MRI ABDOMEN WITHOUT AND WITH CONTRAST (INCLUDING MRCP) TECHNIQUE: Multiplanar multisequence MR imaging of the abdomen was performed both before and after the administration of intravenous contrast. Heavily T2-weighted images of the biliary and pancreatic ducts were obtained, and three-dimensional MRCP images were rendered by post processing. CONTRAST:  8mL GADAVIST GADOBUTROL 1 MMOL/ML IV SOLN COMPARISON:  CT chest, abdomen and pelvis 03/30/2023 FINDINGS: Lower chest: Trace bilateral pleural effusions. Hepatobiliary: There is diffuse hepatic steatosis  with relative increased fatty deposition involving the left lobe compared with the right. No focal enhancing liver lesions. There is relative hypertrophy of the lateral segment of left hepatic lobe. Capsular retraction with underlying parenchymal scarring identified within segment 7 of the liver. No discrete enhancing liver lesions identified. Stones identified within the dependent portion of the gallbladder measuring up to 0.9 cm. The gallbladder wall thickness measures 3 mm. No significant pericholecystic fluid. Common bile duct dilatation is identified with moderate increased intrahepatic bile duct dilatation. The CBD measures up to 1.5 cm. Sludge noted within the distal half of the common bile duct. There is an impacted stone at the ampulla measuring 8 mm, image 15/10 and image 34/5. Pancreas: No mass, inflammatory changes, or other parenchymal abnormality identified. Spleen: The spleen measures 11.7 x 5.6 by 12.1 cm. (Volume = 420 cm^3) Adrenals/Urinary Tract: Normal appearance of the adrenal glands. No suspicious mass or hydronephrosis. Small left kidney cysts are noted. The largest measures 7 mm. No specific follow-up imaging recommended. Stomach/Bowel: Stomach is normal. No dilated loops of large or small bowel. Vascular/Lymphatic: Aortic atherosclerosis. Periportal lymph node measures 1.2 cm, image 78/1102. Other: No ascites. Body wall edema is noted extending along both flanks. Musculoskeletal: No suspicious bone lesions identified. IMPRESSION: 1. Impacted stone at the ampulla measuring 8 mm with associated intra and extrahepatic bile duct dilatation. 2. Cholelithiasis. 3. Diffuse hepatic steatosis with relative increased fatty deposition involving the left lobe compared with the right. There is relative hypertrophy of the lateral segment of left hepatic lobe. Capsular retraction with underlying parenchymal scarring identified within segment  7 of the liver. Imaging findings may be seen in the setting of  early cirrhosis. 4. Trace bilateral pleural effusions. 5. Body wall edema extending along both flanks. 6. Mild splenomegaly. 7. Aortic Atherosclerosis (ICD10-I70.0). These results will be called to the ordering clinician or representative by the Radiologist Assistant, and communication documented in the PACS or [employer]. Electronically Signed   By: Signa Kell M.D.   On: 04/02/2023 05:25   ECHOCARDIOGRAM COMPLETE  Result Date: 03/31/2023    ECHOCARDIOGRAM REPORT   Patient Name:   HOLLEN LAWTER Date of Exam: 03/31/2023 Medical Rec #:  [id number]     Height:       62.2 in Accession #:    [id number]    Weight:       177.5 lb Date of Birth:  02/05/1941      BSA:          1.822 m Patient Age:    82 years      BP:           104/54 mmHg Patient Gender: F             HR:           99 bpm. Exam Location:  Inpatient Procedure: 2D Echo, Cardiac Doppler, Color Doppler and Intracardiac            Opacification Agent Indications:    Dyspnea R06.00  History:        Patient has prior history of Echocardiogram examinations, most                 recent 06/04/2021. Previous Myocardial Infarction; Risk                 Factors:Hypertension.  Sonographer:    Lucendia Herrlich RCS Referring Phys: Elenore Paddy REESE IMPRESSIONS  1. Left ventricular ejection fraction, by estimation, is 60 to 65%. The left ventricle has normal function. The left ventricle has no regional wall motion abnormalities. There is mild left ventricular hypertrophy of the basal-septal segment. Left ventricular diastolic parameters are consistent with Grade I diastolic dysfunction (impaired relaxation).  2. Right ventricular systolic function is normal. The right ventricular size is normal. There is normal pulmonary artery systolic pressure. The estimated right ventricular systolic pressure is 28.6 mmHg.  3. The mitral valve is degenerative. No evidence of mitral valve regurgitation. No evidence of mitral stenosis.  4. The aortic valve is tricuspid. Aortic  valve regurgitation is trivial. Aortic valve sclerosis/calcification is present, without any evidence of aortic stenosis. Aortic valve Vmax measures 1.51 m/s.  5. The inferior vena cava is normal in size with greater than 50% respiratory variability, suggesting right atrial pressure of 3 mmHg. FINDINGS  Left Ventricle: Left ventricular ejection fraction, by estimation, is 60 to 65%. The left ventricle has normal function. The left ventricle has no regional wall motion abnormalities. Definity contrast agent was given IV to delineate the left ventricular  endocardial borders. The left ventricular internal cavity size was normal in size. There is mild left ventricular hypertrophy of the basal-septal segment. Left ventricular diastolic parameters are consistent with Grade I diastolic dysfunction (impaired relaxation). Normal left ventricular filling pressure. Right Ventricle: The right ventricular size is normal. No increase in right ventricular wall thickness. Right ventricular systolic function is normal. There is normal pulmonary artery systolic pressure. The tricuspid regurgitant velocity is 2.53 m/s, and  with an assumed right atrial pressure of 3 mmHg, the estimated right ventricular systolic pressure is 28.6 mmHg.  Left Atrium: Left atrial size was normal in size. Right Atrium: Right atrial size was normal in size. Pericardium: There is no evidence of pericardial effusion. Mitral Valve: The mitral valve is degenerative in appearance. Mild to moderate mitral annular calcification. No evidence of mitral valve regurgitation. No evidence of mitral valve stenosis. Tricuspid Valve: The tricuspid valve is normal in structure. Tricuspid valve regurgitation is trivial. No evidence of tricuspid stenosis. Aortic Valve: The aortic valve is tricuspid. Aortic valve regurgitation is trivial. Aortic valve sclerosis/calcification is present, without any evidence of aortic stenosis. Aortic valve peak gradient measures 9.1 mmHg.  Pulmonic Valve: The pulmonic valve was normal in structure. Pulmonic valve regurgitation is not visualized. No evidence of pulmonic stenosis. Aorta: The aortic root is normal in size and structure. Venous: The inferior vena cava is normal in size with greater than 50% respiratory variability, suggesting right atrial pressure of 3 mmHg. IAS/Shunts: No atrial level shunt detected by color flow Doppler.  LEFT VENTRICLE PLAX 2D LVIDd:         4.50 cm   Diastology LVIDs:         3.30 cm   LV e' lateral:   6.85 cm/s LV PW:         1.10 cm   LV E/e' lateral: 9.6 LV IVS:        1.10 cm LVOT diam:     1.90 cm LV SV:         54 LV SV Index:   30 LVOT Area:     2.84 cm  RIGHT VENTRICLE             IVC RV S prime:     15.10 cm/s  IVC diam: 1.50 cm TAPSE (M-mode): 1.7 cm LEFT ATRIUM             Index        RIGHT ATRIUM           Index LA diam:        3.30 cm 1.81 cm/m   RA Area:     10.20 cm LA Vol (A2C):   40.3 ml 22.12 ml/m  RA Volume:   15.60 ml  8.56 ml/m LA Vol (A4C):   40.7 ml 22.34 ml/m LA Biplane Vol: 42.3 ml 23.22 ml/m  AORTIC VALVE AV Area (Vmax): 2.19 cm AV Vmax:        151.00 cm/s AV Peak Grad:   9.1 mmHg LVOT Vmax:      116.67 cm/s LVOT Vmean:     78.633 cm/s LVOT VTI:       0.190 m  AORTA Ao Root diam: 3.10 cm Ao Asc diam:  3.40 cm MITRAL VALVE               TRICUSPID VALVE MV Area (PHT): 4.36 cm    TR Peak grad:   25.6 mmHg MV Decel Time: 174 msec    TR Vmax:        253.00 cm/s MR Peak grad: 22.8 mmHg MR Vmax:      239.00 cm/s  SHUNTS MV E velocity: 65.60 cm/s  Systemic VTI:  0.19 m MV A velocity: 88.60 cm/s  Systemic Diam: 1.90 cm MV E/A ratio:  0.74 Armanda Magic MD Electronically signed by Armanda Magic MD Signature Date/Time: 03/31/2023/7:35:40 PM    Final       Assessment / Plan:   #71  82 year old white female with metastatic melanoma, undergoing chemo/immunotherapy with evidence of prior some progression of disease at  last PET scan  #2 admitted with sepsis-E. coli bacteremia, then CT imaging  showed cholelithiasis, some progressive metastatic adenopathy in the right external iliac and inguinal regions, progressive intrahepatic and extrahepatic biliary ductal dilation no definite stones seen on CT  MRI/MRCP last p.m. does show a stone impacted in the distal common bile duct measuring 8 mm, and cholelithiasis.,  Body wall edema along both flanks, diffuse hepatic steatosis and query early cirrhosis  She has been on IV Rocephin, she is feeling well LFTs have trended down T. bili 1.2  #2  Acute kidney injury secondary to above improved #3  Coronary artery disease #4  Anemia/thrombocytopenia  Plan:  keep n.p.o. this morning Patient has been scheduled for ERCP with stone extraction with Dr. Russella Dar for 12:30 PM today.  Procedure was discussed in detail with the patient including indications risks and benefits, we reviewed biliary images, and specifically discussed complications including 4 to 5% risk of pancreatitis, bleeding, perforation, and anesthesia complications.  Patient is agreeable to proceed  She has been on subcu heparin, (last does 6 am)-holding subcu heparin for at least 24 hours Continue IV Rocephin, dose to be given before ERCP  Further recommendations pending ERCP Will need to consider laparoscopic cholecystectomy, however may need to have further conversation with her oncologist regarding timing etc. as she is missing chemo which was scheduled to be today    Principal Problem:   Sepsis (HCC) Active Problems:   E coli bacteremia   Elevated liver enzymes    LOS: 3 days   Amy EsterwoodPA-C  04/02/2023, 9:25 AM   Attending Physician Note   I have taken an interval history, reviewed the chart and examined the patient. I performed a substantive portion of this encounter, including complete performance of at least one of the key components, in conjunction with the APP. I agree with the APP's note, impression and recommendations with my edits. My additional impressions  and recommendations are as follows.   MRCP showed an 8 mm CBD stone, diffuse biliary tree dilation, cholelithiasis. ERCP today.   Claudette Head, MD [facility] See AMION, [facility], for our on call provider

## 2023-04-02 NOTE — Interval H&P Note (Signed)
History and Physical Interval Note:  04/02/2023 12:52 PM  Elizabeth Norman  has presented today for surgery, with the diagnosis of CBD stone.  The various methods of treatment have been discussed with the patient and family. After consideration of risks, benefits and other options for treatment, the patient has consented to  Procedure(s): ENDOSCOPIC RETROGRADE CHOLANGIOPANCREATOGRAPHY (ERCP) (N/A) as a surgical intervention.  The patient's history has been reviewed, patient examined, no change in status, stable for surgery.  I have reviewed the patient's chart and labs.  Questions were answered to the patient's satisfaction.     Venita Lick. Russella Dar

## 2023-04-02 NOTE — Anesthesia Procedure Notes (Signed)
Procedure Name: Intubation Date/Time: 04/02/2023 12:59 PM  Performed by: April Holding, CRNAPre-anesthesia Checklist: Patient identified, Emergency Drugs available, Suction available and Patient being monitored Patient Re-evaluated:Patient Re-evaluated prior to induction Oxygen Delivery Method: Circle System Utilized Preoxygenation: Pre-oxygenation with 100% oxygen Induction Type: IV induction Ventilation: Mask ventilation without difficulty Laryngoscope Size: Miller and 2 Grade View: Grade I Tube type: Oral Tube size: 7.0 mm Number of attempts: 1 Airway Equipment and Method: Stylet and Oral airway Placement Confirmation: ETT inserted through vocal cords under direct vision, positive ETCO2 and breath sounds checked- equal and bilateral Secured at: 22 cm Tube secured with: Tape Dental Injury: Teeth and Oropharynx as per pre-operative assessment

## 2023-04-02 NOTE — Evaluation (Signed)
Physical Therapy Evaluation Patient Details Name: Elizabeth Norman MRN: [id number] DOB: 30-Mar-1941 Today's Date: 04/02/2023  History of Present Illness  82 year old woman who presented to [facility] ED 11/18 with chills, SOB, dizziness, lightheadedness. Septic shock,  immunocompromised host  E. coli bacteremia, likely GI source PMHx significant for HTN, CAD, malignant melanoma, immunotherapy myocarditis, hypothyroidism.  Clinical Impression  Pt admitted with above diagnosis. Independent and active at baseline, drives, denies falls in the past 6 months. She is oriented x4 this morning. Pt mobilized at a supervision to Mod I level. Declines using RW for support, demonstrating minor sway, deviations from straight path with gait, antalgic pattern, but no LOB or buckling of LEs. Pt likely close to baseline. Has RW and rollator which she uses as needed. States she has good family support, up to 24/7 assist if needed. Planned ERCP today per notes. Would like to follow-up post procedure to ensure no set-backs with functional mobility. Update recs as indicated but currently no post acute needs identified. Pt currently with functional limitations due to the deficits listed below (see PT Problem List). Pt will benefit from acute skilled PT to increase their independence and safety with mobility to allow discharge.           If plan is discharge home, recommend the following: A little help with walking and/or transfers;A little help with bathing/dressing/bathroom;Assist for transportation;Help with stairs or ramp for entrance   Can travel by private vehicle        Equipment Recommendations None recommended by PT  Recommendations for Other Services       Functional Status Assessment Patient has had a recent decline in their functional status and demonstrates the ability to make significant improvements in function in a reasonable and predictable amount of time.     Precautions / Restrictions  Precautions Precautions: Fall Restrictions Weight Bearing Restrictions: No      Mobility  Bed Mobility Overal bed mobility: Modified Independent             General bed mobility comments: extra time no assist    Transfers Overall transfer level: Needs assistance Equipment used: Rolling walker (2 wheels) Transfers: Sit to/from Stand Sit to Stand: Supervision           General transfer comment: supervision for safety, states she does not need RW upon standing and pushed to side. Stable without LOB but mild sway present.    Ambulation/Gait Ambulation/Gait assistance: Supervision Gait Distance (Feet): 100 Feet Assistive device: None Gait Pattern/deviations: Step-through pattern, Decreased stride length, Antalgic, Drifts right/left Gait velocity: dec Gait velocity interpretation: <1.8 ft/sec, indicate of risk for recurrent falls   General Gait Details: Declines RW use. Mild drift Lt and Rt but without overt LOB. Mildly antalgic pattern. No buckling or physical intervention needed. Educated on safety and awareness.  Stairs            Wheelchair Mobility     Tilt Bed    Modified Rankin (Stroke Patients Only)       Balance Overall balance assessment: Mild deficits observed, not formally tested                                           Pertinent Vitals/Pain Pain Assessment Pain Assessment: No/denies pain    Home Living Family/patient expects to be discharged to:: Private residence Living Arrangements: Alone Available Help at Discharge: Family;Friend(s);Available 24 hours/day  Type of Home: House Home Access: Stairs to enter Entrance Stairs-Rails: Right Entrance Stairs-Number of Steps: 1   Home Layout: One level Home Equipment: Agricultural consultant (2 wheels);Rollator (4 wheels);Cane - single point;Hand held shower head      Prior Function Prior Level of Function : Independent/Modified Independent;Driving             Mobility  Comments: Rollator to mobilize at baseline ADLs Comments: Ind, drives, goes to church     Extremity/Trunk Assessment   Upper Extremity Assessment Upper Extremity Assessment: Defer to OT evaluation    Lower Extremity Assessment Lower Extremity Assessment: Generalized weakness       Communication   Communication Communication: No apparent difficulties  Cognition Arousal: Alert Behavior During Therapy: WFL for tasks assessed/performed Overall Cognitive Status: Within Functional Limits for tasks assessed                                          General Comments General comments (skin integrity, edema, etc.): BP 119/59 supine, 129/72 seated. No dizziness    Exercises     Assessment/Plan    PT Assessment Patient needs continued PT services  PT Problem List Decreased strength;Decreased activity tolerance;Decreased balance;Decreased mobility       PT Treatment Interventions DME instruction;Gait training;Stair training;Functional mobility training;Therapeutic activities;Therapeutic exercise;Balance training;Neuromuscular re-education;Patient/family education    PT Goals (Current goals can be found in the Care Plan section)  Acute Rehab PT Goals Patient Stated Goal: Get well return home PT Goal Formulation: With patient Time For Goal Achievement: 04/16/23 Potential to Achieve Goals: Good    Frequency Min 1X/week     Co-evaluation               AM-PAC PT "6 Clicks" Mobility  Outcome Measure Help needed turning from your back to your side while in a flat bed without using bedrails?: None Help needed moving from lying on your back to sitting on the side of a flat bed without using bedrails?: None Help needed moving to and from a bed to a chair (including a wheelchair)?: A Little Help needed standing up from a chair using your arms (e.g., wheelchair or bedside chair)?: A Little Help needed to walk in hospital room?: A Little Help needed climbing 3-5  steps with a railing? : A Little 6 Click Score: 20    End of Session Equipment Utilized During Treatment: Gait belt Activity Tolerance: Patient tolerated treatment well Patient left: in bed;with call bell/phone within reach;with bed alarm set Nurse Communication: Mobility status PT Visit Diagnosis: Unsteadiness on feet (R26.81);Other abnormalities of gait and mobility (R26.89);Muscle weakness (generalized) (M62.81)    Time: 1027-2536 PT Time Calculation (min) (ACUTE ONLY): 17 min   Charges:   PT Evaluation $PT Eval Low Complexity: 1 Low   PT General Charges $$ ACUTE PT VISIT: 1 Visit         Kathlyn Sacramento, PT, DPT [facility]  Rehabilitation Services Physical Therapist Office: [phone] Website: [email or web address]   Berton Mount 04/02/2023, 10:04 AM

## 2023-04-02 NOTE — Plan of Care (Signed)
A/Ox4 and on room air. Had abdominal MRI with MRCP. Prn xanax given. Up with contact guard assist. No overnight changes noted.    Problem: Education: Goal: Knowledge of General Education information will improve Description: Including pain rating scale, medication(s)/side effects and non-pharmacologic comfort measures 04/02/2023 0541 by Chrisandra Netters, RN Outcome: Progressing 04/02/2023 0533 by Chrisandra Netters, RN Outcome: Progressing   Problem: Health Behavior/Discharge Planning: Goal: Ability to manage health-related needs will improve 04/02/2023 0541 by Chrisandra Netters, RN Outcome: Progressing 04/02/2023 0533 by Chrisandra Netters, RN Outcome: Progressing   Problem: Clinical Measurements: Goal: Ability to maintain clinical measurements within normal limits will improve 04/02/2023 0541 by Chrisandra Netters, RN Outcome: Progressing 04/02/2023 0533 by Chrisandra Netters, RN Outcome: Progressing Goal: Will remain free from infection 04/02/2023 0541 by Chrisandra Netters, RN Outcome: Progressing 04/02/2023 0533 by Chrisandra Netters, RN Outcome: Progressing Goal: Diagnostic test results will improve 04/02/2023 0541 by Chrisandra Netters, RN Outcome: Progressing 04/02/2023 0533 by Chrisandra Netters, RN Outcome: Progressing Goal: Respiratory complications will improve 04/02/2023 0541 by Chrisandra Netters, RN Outcome: Progressing 04/02/2023 0533 by Chrisandra Netters, RN Outcome: Progressing Goal: Cardiovascular complication will be avoided 04/02/2023 0541 by Chrisandra Netters, RN Outcome: Progressing 04/02/2023 0533 by Chrisandra Netters, RN Outcome: Progressing   Problem: Activity: Goal: Risk for activity intolerance will decrease 04/02/2023 0541 by Chrisandra Netters, RN Outcome: Progressing 04/02/2023 0533 by Chrisandra Netters, RN Outcome: Progressing   Problem: Nutrition: Goal: Adequate nutrition will be maintained 04/02/2023 0541  by Chrisandra Netters, RN Outcome: Progressing 04/02/2023 0533 by Chrisandra Netters, RN Outcome: Progressing   Problem: Coping: Goal: Level of anxiety will decrease 04/02/2023 0541 by Chrisandra Netters, RN Outcome: Progressing 04/02/2023 0533 by Chrisandra Netters, RN Outcome: Progressing   Problem: Elimination: Goal: Will not experience complications related to bowel motility 04/02/2023 0541 by Chrisandra Netters, RN Outcome: Progressing 04/02/2023 0533 by Chrisandra Netters, RN Outcome: Progressing Goal: Will not experience complications related to urinary retention 04/02/2023 0541 by Chrisandra Netters, RN Outcome: Progressing 04/02/2023 0533 by Chrisandra Netters, RN Outcome: Progressing   Problem: Pain Management: Goal: General experience of comfort will improve 04/02/2023 0541 by Chrisandra Netters, RN Outcome: Progressing 04/02/2023 0533 by Chrisandra Netters, RN Outcome: Progressing   Problem: Safety: Goal: Ability to remain free from injury will improve 04/02/2023 0541 by Chrisandra Netters, RN Outcome: Progressing 04/02/2023 0533 by Chrisandra Netters, RN Outcome: Progressing   Problem: Skin Integrity: Goal: Risk for impaired skin integrity will decrease 04/02/2023 0541 by Chrisandra Netters, RN Outcome: Progressing 04/02/2023 0533 by Chrisandra Netters, RN Outcome: Progressing

## 2023-04-02 NOTE — Anesthesia Preprocedure Evaluation (Addendum)
Anesthesia Evaluation    Reviewed: Allergy & Precautions, Patient's Chart, lab work & pertinent test results, reviewed documented beta blocker date and time   History of Anesthesia Complications Negative for: history of anesthetic complications  Airway Mallampati: II  TM Distance: >3 FB Neck ROM: Full    Dental  (+) Edentulous Upper, Poor Dentition, Dental Advisory Given,    Pulmonary neg pulmonary ROS   Pulmonary exam normal breath sounds clear to auscultation       Cardiovascular hypertension, Pt. on home beta blockers + Past MI  Normal cardiovascular exam Rhythm:Regular Rate:Normal  TTE 03/31/23: EF 60-65%, mild LVH, grade I DD, valves ok      Neuro/Psych   Anxiety     negative neurological ROS     GI/Hepatic Neg liver ROS,GERD  Medicated,,CBD stone   Endo/Other  Hypothyroidism    Renal/GU negative Renal ROS     Musculoskeletal  (+) Arthritis ,    Abdominal   Peds  Hematology negative hematology ROS (+)   Anesthesia Other Findings Day of surgery medications reviewed with patient.  Reproductive/Obstetrics                              Anesthesia Physical Anesthesia Plan  ASA: 3  Anesthesia Plan: General   Post-op Pain Management: Minimal or no pain anticipated   Induction: Intravenous  PONV Risk Score and Plan: 3 and Ondansetron, Dexamethasone and Treatment may vary due to age or medical condition  Airway Management Planned: Oral ETT  Additional Equipment: None  Intra-op Plan:   Post-operative Plan: Extubation in OR  Informed Consent:   Plan Discussed with:   Anesthesia Plan Comments:          Anesthesia Quick Evaluation

## 2023-04-02 NOTE — Progress Notes (Addendum)
[name], is a 82 y.o. female, DOB - 05-May-1941, GNF:[id number] Admit date - 03/30/2023    Outpatient Primary MD for the patient is [name], [name], MD  LOS - 3  days  Chief Complaint  Patient presents with   Shortness of Breath       Brief summary   Patient is 82 year old woman who presented to [facility] ED 11/18 with chills, SOB, dizziness, lightheadedness. 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    On presentation to ED, labs demonstrated AKI, AGMA, elevated BNP, WBC 21 and LA 7.5. Patient was hypotensive requiring Levophed initiation with  concern for developing septic shock; broad-spectrum antibiotics were started (vanc/cefepime/Flagyl). CXR unremarkable. CT Head NAICA. CT Chest/A/P demonstrated unchanged splenomegaly, no acute GU findings, +progressive intrahepatic and extrahepatic biliary duct dilation but no history of choledocholithiasis, progressive metastatic adenopathy of the R pelvis and inguinal region. RUQ US demonstrated cholelithiasis without evidence of acute cholecystitis, CBD dilatation of 10mm.  PCCM was consulted for ICU admission to [facility]. [facility] assumed care on 11/20   Assessment & Plan    Principal Problem:  Septic shock,  immunocompromised host E. coli bacteremia, likely GI source -Patient presented from [facility], ED with septic shock, leukocytosis, lactic acidosis, hypotensive, BP 60/38.  She was transferred to [facility] ICU and was placed on vasopressors. -Placed on empiric vancomycin, cefepime, Flagyl - CT Chest/A/P showed intrahepatic and extrahepatic biliary duct dilation, cholelithiasis without cholecystitis.  No evidence of choledocholithiasis.  Progressive metastatic adenopathy in the right  external iliac and inguinal region consistent with worsening metastatic melanoma -Blood cultures 11/18 positive for E. Coli, antibiotics narrowed to IV ceftriaxone -Abdominal ultrasound showed cholelithiasis, no acute cholecystitis, CBD dilated measuring 10 mm, recommending MRCP or ERCP -LFTs improving, leukocytosis resolved -GI consulted, MRCP showed impacted stone at the ampulla measuring 8 mm with associated intra and extrahepatic bile duct dilation, cholelithiasis, diffuse hepatic steatosis -N.p.o. for ERCP today Addendum  ERCP reviewed, general surgery consulted for consideration for cholecystectomy  Active problems  Transaminitis with hyperbilirubinemia Symptomatic cholelithiasis -CT abdomen pelvis showed intrahepatic and extrahepatic biliary duct dilation, cholelithiasis without cholecystitis, no choledocholithiasis.   -Abdominal ultrasound showed cholelithiasis, no acute cholecystitis, CBD dilated measuring 10 mm  -GI consulted.  MRCP showed impacted stone at the ampulla with associated intra and extrahepatic bile duct dilation, cholelithiasis -ERCP planned today   Acute kidney injury with lactic acidosis -Likely prerenal versus septic ATN  -Presented with creatinine of 1.62, creatinine was 0.8 on 03/25/2023 -Was placed on IV fluid resuscitation and vasopressors.  Creatinine now improving, 0.8  Hyponatremia, hypokalemia -Presented with sodium of 132 trended down to 130 likely due to #1 -Stable  Elevated Troponin/demand ischemia, CAD -Likely due to demand ischemia. -2D echo 11/19 showed EF of 60 to 65%, mild LVH, no regional wall motion abnormalities, G1 DD right ventricular systolic function normal.     Malignant  melanoma  Immunotherapy myocarditis  - Port-a-cath in place; on ipilimumab and pembrolizumab, last treated 1 week PTA. - Management per Oncology (followed by Dr. Ellin Saba) - was due to have chemo this week, currently on hold    Hypothyroidism -TSH 0.173, free  T4 elevated 1.4 -Decrease Synthroid to 125 mcg daily  Obesity  Estimated body mass index is 30.91 kg/m as calculated from the following:   Height as of this encounter: 5\' 2"  (1.575 m).   Weight as of this encounter: 76.7 kg.  Code Status: Full CODE STATUS DVT Prophylaxis:  heparin injection 5,000 Units Start: 03/31/23 1400 SCDs Start: 03/30/23 2150   Level of Care: Level of care: Telemetry Medical Family Communication: Updated patient's Sister Elease Hashimoto and daughter-in-law Zella Ball on the phone Disposition Plan:      Remains inpatient appropriate: Awaiting ERCP today PT evaluation recommended no PT follow-up  Procedures:  None  Consultants:   Admitted by CCM GI  Antimicrobials:   Anti-infectives (From admission, onward)    Start     Dose/Rate Route Frequency Ordered Stop   04/02/23 1200  [MAR Hold]  cefTRIAXone (ROCEPHIN) 2 g in sodium chloride 0.9 % 100 mL IVPB        (MAR Hold since Thu 04/02/2023 at 1211.Hold Reason: Transfer to a Procedural area)  Note to Pharmacy: Give Rocephin at 11 am today before ERCP   2 g 200 mL/hr over 30 Minutes Intravenous Every 24 hours 04/02/23 0923     04/01/23 1500  vancomycin (VANCOCIN) IVPB 1000 mg/200 mL premix  Status:  Discontinued        1,000 mg 200 mL/hr over 60 Minutes Intravenous Every 48 hours 03/30/23 1425 03/31/23 1519   04/01/23 1200  cefTRIAXone (ROCEPHIN) 2 g in sodium chloride 0.9 % 100 mL IVPB  Status:  Discontinued        2 g 200 mL/hr over 30 Minutes Intravenous Every 24 hours 04/01/23 0959 04/02/23 0923   03/31/23 1500  ceFEPIme (MAXIPIME) 2 g in sodium chloride 0.9 % 100 mL IVPB  Status:  Discontinued        2 g 200 mL/hr over 30 Minutes Intravenous Every 24 hours 03/30/23 1421 03/31/23 0932   03/31/23 1500  ceFEPIme (MAXIPIME) 2 g in sodium chloride 0.9 % 100 mL IVPB  Status:  Discontinued        2 g 200 mL/hr over 30 Minutes Intravenous Every 12 hours 03/31/23 0932 04/01/23 0959   03/30/23 1445  metroNIDAZOLE  (FLAGYL) IVPB 500 mg  Status:  Discontinued        500 mg 100 mL/hr over 60 Minutes Intravenous Every 12 hours 03/30/23 1443 04/01/23 0959   03/30/23 1430  vancomycin (VANCOREADY) IVPB 1500 mg/300 mL        1,500 mg 150 mL/hr over 120 Minutes Intravenous  Once 03/30/23 1415 03/30/23 1730   03/30/23 1415  ceFEPIme (MAXIPIME) 2 g in sodium chloride 0.9 % 100 mL IVPB        2 g 200 mL/hr over 30 Minutes Intravenous  Once 03/30/23 1406 03/30/23 1458   03/30/23 1415  metroNIDAZOLE (FLAGYL) IVPB 500 mg  Status:  Discontinued        500 mg 100 mL/hr over 60 Minutes Intravenous  Once 03/30/23 1406 03/30/23 1443   03/30/23 1415  vancomycin (VANCOCIN) IVPB 1000 mg/200 mL premix  Status:  Discontinued        1,000 mg 200 mL/hr over 60 Minutes Intravenous  Once 03/30/23 1406 03/30/23 1415  Medications  [MAR Hold] Chlorhexidine Gluconate Cloth  6 each Topical Daily   diclofenac  100 mg Rectal Once   [MAR Hold] heparin  5,000 Units Subcutaneous Q8H   [MAR Hold] influenza vaccine adjuvanted  0.5 mL Intramuscular Tomorrow-1000   [MAR Hold] levothyroxine  125 mcg Oral Q0600   [MAR Hold] pantoprazole  40 mg Oral Daily   [MAR Hold] prednisoLONE acetate  1 drop Both Eyes Q1200   [MAR Hold] sodium chloride flush  10-40 mL Intracatheter Q12H      Subjective:   Elizabeth Norman was seen and examined today.  Feeling better, seen this morning, awaiting ERCP.  No acute abdominal pain, nausea vomiting, chest pain.  Objective:   Vitals:   04/01/23 2245 04/02/23 0445 04/02/23 0824 04/02/23 1213  BP: 137/68 138/69 133/67 136/70  Pulse: 94 95 81 81  Resp: 18 17  19   Temp: 99 F (37.2 C) 98.4 F (36.9 C)  (!) 96.6 F (35.9 C)  TempSrc: Oral Oral  Tympanic  SpO2: 97% 96% 96% 96%  Weight:  75.3 kg  76.7 kg  Height:    5\' 2"  (1.575 m)    Intake/Output Summary (Last 24 hours) at 04/02/2023 1257 Last data filed at 04/02/2023 0700 Gross per 24 hour  Intake --  Output 1100 ml  Net -1100 ml      Wt Readings from Last 3 Encounters:  04/02/23 76.7 kg  03/11/23 76.2 kg  02/19/23 76.7 kg   Physical Exam General: Alert and oriented x 3, NAD Cardiovascular: S1 S2 clear, RRR.  Respiratory: CTAB Gastrointestinal: Soft, nontender, nondistended, NBS Ext: no pedal edema bilaterally Neuro: no new deficits Psych: Normal affect     Data Reviewed:  I have personally reviewed following labs    CBC Lab Results  Component Value Date   WBC 6.5 04/02/2023   RBC 3.25 (L) 04/02/2023   HGB 9.0 (L) 04/02/2023   HCT 28.6 (L) 04/02/2023   MCV 88.0 04/02/2023   MCH 27.7 04/02/2023   PLT 116 (L) 04/02/2023   MCHC 31.5 04/02/2023   RDW 14.1 04/02/2023   LYMPHSABS 0.2 (L) 03/30/2023   MONOABS 0.4 03/30/2023   EOSABS 0.0 03/30/2023   BASOSABS 0.2 (H) 03/30/2023     Last metabolic panel Lab Results  Component Value Date   NA 133 (L) 04/02/2023   K 4.3 04/02/2023   CL 101 04/02/2023   CO2 25 04/02/2023   BUN 15 04/02/2023   CREATININE 0.78 04/02/2023   GLUCOSE 99 04/02/2023   GFRNONAA >60 04/02/2023   GFRAA >60 09/29/2014   CALCIUM 7.8 (L) 04/02/2023   PHOS 2.5 03/31/2023   PROT 4.7 (L) 04/02/2023   ALBUMIN 1.9 (L) 04/02/2023   BILITOT 1.2 (H) 04/02/2023   ALKPHOS 197 (H) 04/02/2023   AST 38 04/02/2023   ALT 41 04/02/2023   ANIONGAP 7 04/02/2023    CBG (last 3)  Recent Labs    03/30/23 2206 03/30/23 2237 03/30/23 2337  GLUCAP 62* 174* 108*      Coagulation Profile: Recent Labs  Lab 03/30/23 1236 04/01/23 2353  INR 1.3* 1.1     Radiology Studies: I have personally reviewed the imaging studies  MR ABDOMEN MRCP W WO CONTAST  Result Date: 04/02/2023 CLINICAL DATA:  Jaundice.  History of melanoma. EXAM: MRI ABDOMEN WITHOUT AND WITH CONTRAST (INCLUDING MRCP) TECHNIQUE: Multiplanar multisequence MR imaging of the abdomen was performed both before and after the administration of intravenous contrast. Heavily T2-weighted images of the biliary and pancreatic  ducts were obtained, and three-dimensional MRCP images were rendered by post processing. CONTRAST:  8mL GADAVIST GADOBUTROL 1 MMOL/ML IV SOLN COMPARISON:  CT chest, abdomen and pelvis 03/30/2023 FINDINGS: Lower chest: Trace bilateral pleural effusions. Hepatobiliary: There is diffuse hepatic steatosis with relative increased fatty deposition involving the left lobe compared with the right. No focal enhancing liver lesions. There is relative hypertrophy of the lateral segment of left hepatic lobe. Capsular retraction with underlying parenchymal scarring identified within segment 7 of the liver. No discrete enhancing liver lesions identified. Stones identified within the dependent portion of the gallbladder measuring up to 0.9 cm. The gallbladder wall thickness measures 3 mm. No significant pericholecystic fluid. Common bile duct dilatation is identified with moderate increased intrahepatic bile duct dilatation. The CBD measures up to 1.5 cm. Sludge noted within the distal half of the common bile duct. There is an impacted stone at the ampulla measuring 8 mm, image 15/10 and image 34/5. Pancreas: No mass, inflammatory changes, or other parenchymal abnormality identified. Spleen: The spleen measures 11.7 x 5.6 by 12.1 cm. (Volume = 420 cm^3) Adrenals/Urinary Tract: Normal appearance of the adrenal glands. No suspicious mass or hydronephrosis. Small left kidney cysts are noted. The largest measures 7 mm. No specific follow-up imaging recommended. Stomach/Bowel: Stomach is normal. No dilated loops of large or small bowel. Vascular/Lymphatic: Aortic atherosclerosis. Periportal lymph node measures 1.2 cm, image 78/1102. Other: No ascites. Body wall edema is noted extending along both flanks. Musculoskeletal: No suspicious bone lesions identified. IMPRESSION: 1. Impacted stone at the ampulla measuring 8 mm with associated intra and extrahepatic bile duct dilatation. 2. Cholelithiasis. 3. Diffuse hepatic steatosis with  relative increased fatty deposition involving the left lobe compared with the right. There is relative hypertrophy of the lateral segment of left hepatic lobe. Capsular retraction with underlying parenchymal scarring identified within segment 7 of the liver. Imaging findings may be seen in the setting of early cirrhosis. 4. Trace bilateral pleural effusions. 5. Body wall edema extending along both flanks. 6. Mild splenomegaly. 7. Aortic Atherosclerosis (ICD10-I70.0). These results will be called to the ordering clinician or representative by the Radiologist Assistant, and communication documented in the PACS or [employer]. Electronically Signed   By: Signa Kell M.D.   On: 04/02/2023 05:25   MR 3D Recon At Scanner  Result Date: 04/02/2023 CLINICAL DATA:  Jaundice.  History of melanoma. EXAM: MRI ABDOMEN WITHOUT AND WITH CONTRAST (INCLUDING MRCP) TECHNIQUE: Multiplanar multisequence MR imaging of the abdomen was performed both before and after the administration of intravenous contrast. Heavily T2-weighted images of the biliary and pancreatic ducts were obtained, and three-dimensional MRCP images were rendered by post processing. CONTRAST:  8mL GADAVIST GADOBUTROL 1 MMOL/ML IV SOLN COMPARISON:  CT chest, abdomen and pelvis 03/30/2023 FINDINGS: Lower chest: Trace bilateral pleural effusions. Hepatobiliary: There is diffuse hepatic steatosis with relative increased fatty deposition involving the left lobe compared with the right. No focal enhancing liver lesions. There is relative hypertrophy of the lateral segment of left hepatic lobe. Capsular retraction with underlying parenchymal scarring identified within segment 7 of the liver. No discrete enhancing liver lesions identified. Stones identified within the dependent portion of the gallbladder measuring up to 0.9 cm. The gallbladder wall thickness measures 3 mm. No significant pericholecystic fluid. Common bile duct dilatation is identified with  moderate increased intrahepatic bile duct dilatation. The CBD measures up to 1.5 cm. Sludge noted within the distal half of the common bile duct. There is an impacted stone at the ampulla  measuring 8 mm, image 15/10 and image 34/5. Pancreas: No mass, inflammatory changes, or other parenchymal abnormality identified. Spleen: The spleen measures 11.7 x 5.6 by 12.1 cm. (Volume = 420 cm^3) Adrenals/Urinary Tract: Normal appearance of the adrenal glands. No suspicious mass or hydronephrosis. Small left kidney cysts are noted. The largest measures 7 mm. No specific follow-up imaging recommended. Stomach/Bowel: Stomach is normal. No dilated loops of large or small bowel. Vascular/Lymphatic: Aortic atherosclerosis. Periportal lymph node measures 1.2 cm, image 78/1102. Other: No ascites. Body wall edema is noted extending along both flanks. Musculoskeletal: No suspicious bone lesions identified. IMPRESSION: 1. Impacted stone at the ampulla measuring 8 mm with associated intra and extrahepatic bile duct dilatation. 2. Cholelithiasis. 3. Diffuse hepatic steatosis with relative increased fatty deposition involving the left lobe compared with the right. There is relative hypertrophy of the lateral segment of left hepatic lobe. Capsular retraction with underlying parenchymal scarring identified within segment 7 of the liver. Imaging findings may be seen in the setting of early cirrhosis. 4. Trace bilateral pleural effusions. 5. Body wall edema extending along both flanks. 6. Mild splenomegaly. 7. Aortic Atherosclerosis (ICD10-I70.0). These results will be called to the ordering clinician or representative by the Radiologist Assistant, and communication documented in the PACS or [employer]. Electronically Signed   By: Signa Kell M.D.   On: 04/02/2023 05:25   ECHOCARDIOGRAM COMPLETE  Result Date: 03/31/2023    ECHOCARDIOGRAM REPORT   Patient Name:   Elizabeth Norman Date of Exam: 03/31/2023 Medical Rec #:  [id number]      Height:       62.2 in Accession #:    [id number]    Weight:       177.5 lb Date of Birth:  [birth year]      BSA:          1.822 m Patient Age:    82 years      BP:           104/54 mmHg Patient Gender: F             HR:           99 bpm. Exam Location:  Inpatient Procedure: 2D Echo, Cardiac Doppler, Color Doppler and Intracardiac            Opacification Agent Indications:    Dyspnea R06.00  History:        Patient has prior history of Echocardiogram examinations, most                 recent 06/04/2021. Previous Myocardial Infarction; Risk                 Factors:Hypertension.  Sonographer:    Lucendia Herrlich RCS Referring Phys: Elenore Paddy REESE IMPRESSIONS  1. Left ventricular ejection fraction, by estimation, is 60 to 65%. The left ventricle has normal function. The left ventricle has no regional wall motion abnormalities. There is mild left ventricular hypertrophy of the basal-septal segment. Left ventricular diastolic parameters are consistent with Grade I diastolic dysfunction (impaired relaxation).  2. Right ventricular systolic function is normal. The right ventricular size is normal. There is normal pulmonary artery systolic pressure. The estimated right ventricular systolic pressure is 28.6 mmHg.  3. The mitral valve is degenerative. No evidence of mitral valve regurgitation. No evidence of mitral stenosis.  4. The aortic valve is tricuspid. Aortic valve regurgitation is trivial. Aortic valve sclerosis/calcification is present, without any evidence of aortic stenosis. Aortic valve Vmax measures 1.51 m/s.  5. The inferior vena cava is normal in size with greater than 50% respiratory variability, suggesting right atrial pressure of 3 mmHg. FINDINGS  Left Ventricle: Left ventricular ejection fraction, by estimation, is 60 to 65%. The left ventricle has normal function. The left ventricle has no regional wall motion abnormalities. Definity contrast agent was given IV to delineate the left ventricular   endocardial borders. The left ventricular internal cavity size was normal in size. There is mild left ventricular hypertrophy of the basal-septal segment. Left ventricular diastolic parameters are consistent with Grade I diastolic dysfunction (impaired relaxation). Normal left ventricular filling pressure. Right Ventricle: The right ventricular size is normal. No increase in right ventricular wall thickness. Right ventricular systolic function is normal. There is normal pulmonary artery systolic pressure. The tricuspid regurgitant velocity is 2.53 m/s, and  with an assumed right atrial pressure of 3 mmHg, the estimated right ventricular systolic pressure is 28.6 mmHg. Left Atrium: Left atrial size was normal in size. Right Atrium: Right atrial size was normal in size. Pericardium: There is no evidence of pericardial effusion. Mitral Valve: The mitral valve is degenerative in appearance. Mild to moderate mitral annular calcification. No evidence of mitral valve regurgitation. No evidence of mitral valve stenosis. Tricuspid Valve: The tricuspid valve is normal in structure. Tricuspid valve regurgitation is trivial. No evidence of tricuspid stenosis. Aortic Valve: The aortic valve is tricuspid. Aortic valve regurgitation is trivial. Aortic valve sclerosis/calcification is present, without any evidence of aortic stenosis. Aortic valve peak gradient measures 9.1 mmHg. Pulmonic Valve: The pulmonic valve was normal in structure. Pulmonic valve regurgitation is not visualized. No evidence of pulmonic stenosis. Aorta: The aortic root is normal in size and structure. Venous: The inferior vena cava is normal in size with greater than 50% respiratory variability, suggesting right atrial pressure of 3 mmHg. IAS/Shunts: No atrial level shunt detected by color flow Doppler.  LEFT VENTRICLE PLAX 2D LVIDd:         4.50 cm   Diastology LVIDs:         3.30 cm   LV e' lateral:   6.85 cm/s LV PW:         1.10 cm   LV E/e' lateral: 9.6  LV IVS:        1.10 cm LVOT diam:     1.90 cm LV SV:         54 LV SV Index:   30 LVOT Area:     2.84 cm  RIGHT VENTRICLE             IVC RV S prime:     15.10 cm/s  IVC diam: 1.50 cm TAPSE (M-mode): 1.7 cm LEFT ATRIUM             Index        RIGHT ATRIUM           Index LA diam:        3.30 cm 1.81 cm/m   RA Area:     10.20 cm LA Vol (A2C):   40.3 ml 22.12 ml/m  RA Volume:   15.60 ml  8.56 ml/m LA Vol (A4C):   40.7 ml 22.34 ml/m LA Biplane Vol: 42.3 ml 23.22 ml/m  AORTIC VALVE AV Area (Vmax): 2.19 cm AV Vmax:        151.00 cm/s AV Peak Grad:   9.1 mmHg LVOT Vmax:      116.67 cm/s LVOT Vmean:     78.633 cm/s LVOT VTI:  0.190 m  AORTA Ao Root diam: 3.10 cm Ao Asc diam:  3.40 cm MITRAL VALVE               TRICUSPID VALVE MV Area (PHT): 4.36 cm    TR Peak grad:   25.6 mmHg MV Decel Time: 174 msec    TR Vmax:        253.00 cm/s MR Peak grad: 22.8 mmHg MR Vmax:      239.00 cm/s  SHUNTS MV E velocity: 65.60 cm/s  Systemic VTI:  0.19 m MV A velocity: 88.60 cm/s  Systemic Diam: 1.90 cm MV E/A ratio:  0.74 Armanda Magic MD Electronically signed by Armanda Magic MD Signature Date/Time: 03/31/2023/7:35:40 PM    Final        Thad Ranger M.D. [facility] 04/02/2023, 12:57 PM  Available via [employer] secure chat 7am-7pm After 7 pm, please refer to night coverage provider listed on amion.

## 2023-04-02 NOTE — Care Management Important Message (Signed)
Important Message  Patient Details  Name: Elizabeth Norman MRN: [id number] Date of Birth: 05/27/1940   Important Message Given:  Yes - Medicare IM     Dorena Bodo 04/02/2023, 3:24 PM

## 2023-04-02 NOTE — Op Note (Addendum)
[facility] Patient Name: Elizabeth Norman Procedure Date : 04/02/2023 MRN: [id number] Attending MD: Meryl Dare , MD, [phone] Date of Birth: Sep 11, 1940 CSN: [id number] Age: 82 Admit Type: Inpatient Procedure:                ERCP Indications:              Bile duct stone(s), Ascending cholangitis, Elevated                            liver enzymes Providers:                Venita Lick. Russella Dar, MD, Stephens Shire RN, RN, Melany Guernsey, Technician Referring MD:             [facility] Medicines:                General Anesthesia Complications:            No immediate complications. Estimated Blood Loss:     Estimated blood loss was minimal. Procedure:                Pre-Anesthesia Assessment:                           - Prior to the procedure, a History and Physical                            was performed, and patient medications and                            allergies were reviewed. The patient's tolerance of                            previous anesthesia was also reviewed. The risks                            and benefits of the procedure and the sedation                            options and risks were discussed with the patient.                            All questions were answered, and informed consent                            was obtained. Prior Anticoagulants: The patient has                            taken no anticoagulant or antiplatelet agents. ASA                            Grade Assessment: III - A patient with severe  systemic disease. After reviewing the risks and                            benefits, the patient was deemed in satisfactory                            condition to undergo the procedure.                           After obtaining informed consent, the scope was                            passed under direct vision. Throughout the                            procedure, the patient's blood pressure,  pulse, and                            oxygen saturations were monitored continuously. The                            [employer] D single use                            duodenoscope was introduced through the mouth, and                            used to inject contrast into and used to inject                            contrast into the bile duct. The ERCP was                            accomplished without difficulty. The patient                            tolerated the procedure well. Scope In: Scope Out: Findings:      The scout film was normal. The scope was advanced to a prominent major       papilla in the descending duodenum. Limited examination of the pharynx,       larynx and associated structures, and upper GI tract showed atrophic       appearing gastric mucosa and a periampullary diverticulum, otherwise       normal. Gastric biopsies obtained. A straight Roadrunner wire was passed       into the biliary tree. The short-nosed traction sphincterotome was       passed over the guidewire and the bile duct was then deeply cannulated.       Contrast was injected. I personally interpreted the bile duct images.       There was appropriate flow of contrast through the ducts. The entire       biliary tree was moderately dilated and diffusely dilated. The largest       diameter was 15 mm. The common bile duct contained filling defects       thought  to be stones and sludge. Cystic duct filled. A 10 mm biliary       sphincterotomy was made with a traction (standard) sphincterotome using       ERBE electrocautery. The sphincterotomy oozed blood which was injected       with 3 cc of epinephrine with control of bleeding. The biliary tree was       swept several times with a 12 mm balloon starting at the bifurcation.       Sludge was swept from the duct. All stones were removed. The final       occlusion cholangiogram was clear and excellent biliary drainage was       then  noted. The PD was not entered by intention. Impression:               - Prominent major papilla.                           - Periampullary diverticulum.                           - Atrophic appearing gastric mucosa. Biopsied.                           - Filling defects consistent with stones and sludge                            noted on the cholangiogram.                           - The entire biliary tree was moderately dilated.                           - Choledocholithiasis was found. Complete removal                            was accomplished by biliary sphincterotomy and                            balloon extraction.                           - A biliary sphincterotomy was performed. Mild                            bleeding at the site treated with epinephrine                            injection with control of bleeding.                           - The biliary tree was swept and cleared. Recommendation:           - Return patient to hospital ward for ongoing care.                           - Observe patient's clinical course following  today's ERCP with therapeutic intervention.                           - Trend LFTs.                           - Consider cholecystectomy.                           - Avoid aspirin and nonsteroidal anti-inflammatory                            medicines for 1 week.                           - Avoid anticoagulants for at least 2 days. Procedure Code(s):        --- Professional ---                           [phone], Endoscopic retrograde                            cholangiopancreatography (ERCP); with removal of                            calculi/debris from biliary/pancreatic duct(s)                           [id number], Endoscopic retrograde                            cholangiopancreatography (ERCP); with                            sphincterotomy/papillotomy                           [phone], Endoscopic catheterization of the biliary                             ductal system, radiological supervision and                            interpretation Diagnosis Code(s):        --- Professional ---                           K80.30, Calculus of bile duct with cholangitis,                            unspecified, without obstruction                           K83.09, Other cholangitis                           R74.8, Abnormal levels of other serum enzymes  K83.8, Other specified diseases of biliary tract                           R93.2, Abnormal findings on diagnostic imaging of                            liver and biliary tract CPT copyright 2022 [employer]. All rights reserved. The codes documented in this report are preliminary and upon coder review may  be revised to meet current compliance requirements. Meryl Dare, MD 04/02/2023 2:01:10 PM This report has been signed electronically. Number of Addenda: 0

## 2023-04-02 NOTE — Progress Notes (Signed)
Initial Nutrition Assessment  DOCUMENTATION CODES:   Non-severe (moderate) malnutrition in context of chronic illness  INTERVENTION:  Advance diet as medically applicable. Once diet is advanced provide Ensure Plus High Protein po BID, each supplement provides 350 kcal and 20 grams of protein. Daily MVM.    NUTRITION DIAGNOSIS:   Moderate Malnutrition related to chronic illness as evidenced by moderate fat depletion, moderate muscle depletion.    GOAL:   Patient will meet greater than or equal to 90% of their needs    MONITOR:   Diet advancement  REASON FOR ASSESSMENT:   Malnutrition Screening Tool, Diagnosis (Sepsis)    ASSESSMENT:      82 y.o. F transferred from [facility] where she presented on 11/18 wuth chills, SOB, dizziness and light headedness. 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 Review of weights revealed; -1% x 30, -3% x 90,-6.25 from 09/03/22. Patient kept drifting off during NFPE.  Was not responding to RD's voice.  Unable to obtain good nutrition history at this time.  Her weight has been trending down with no significant changes. She has been NPO due to endoscopy today.  Once diet advances will add Ensure Plus High Protein po BID, each supplement provides 350 kcal and 20 grams of protein.  Admit weight: 76.2 Kg Current weight: 75.3 kg Weight history:  04/02/23 75.3 kg  03/11/23 76.2 kg  02/19/23 76.7 kg  02/11/23 77.2 kg  01/07/23 78 kg  12/17/22 79.3 kg  11/05/22 79.1 kg  10/15/22 78.7 kg  09/24/22 78.5 kg  09/03/22 80 kg      Average Meal Intake: NPO  Nutritionally Relevant Medications: Scheduled Meds:  levothyroxine  125 mcg Oral Q0600   pantoprazole  40 mg Oral Daily    Labs Reviewed: Component Ref Range & Units 02:36 (04/02/23) 1 d ago (04/01/23) 2 d ago (03/31/23) 3 d ago (03/30/23) 3 d ago (03/30/23) 8 d ago (03/25/23)   Sodium 135 - 145 mmol/L 133 Low  134 Low  132 Low  130 Low  132 Low  133 Low     NUTRITION - FOCUSED PHYSICAL EXAM:  Flowsheet Row Most Recent Value  Orbital Region Moderate depletion  Upper Arm Region Moderate depletion  Thoracic and Lumbar Region Moderate depletion  Buccal Region Moderate depletion  Temple Region Moderate depletion  Clavicle Bone Region Moderate depletion  Clavicle and Acromion Bone Region Moderate depletion  Scapular Bone Region Moderate depletion  Dorsal Hand Moderate depletion  Patellar Region Mild depletion  Anterior Thigh Region Mild depletion  Posterior Calf Region Mild depletion  Edema (RD Assessment) Mild  Hair Reviewed  Eyes Reviewed  Mouth Reviewed  Skin Reviewed  Nails Reviewed       Diet Order:   Diet Order             Diet NPO time specified Except for: Sips with Meds  Diet effective midnight           Diet NPO time specified  Diet effective now                   EDUCATION NEEDS:   Not appropriate for education at this time  Skin:  Skin Assessment: Reviewed RN Assessment  Last BM:     Height:   Ht Readings from Last 1 Encounters:  04/02/23 5\' 2"  (1.575 m)    Weight:   Wt Readings from Last 1 Encounters:  04/02/23 76.7 kg  Ideal Body Weight:     BMI:  Body mass index is 30.91 kg/m.  Estimated Nutritional Needs:   Kcal:  2300- 2600 kcal/day  Protein:  100-115 g/d  Fluid:  25ml/kcal    Jamelle Haring RDN, LDN Clinical Dietitian  RDN pager # available on Amion

## 2023-04-02 NOTE — Progress Notes (Addendum)
Patient ID: Elizabeth Norman, female   DOB: 02-Mar-1941, 82 y.o.   MRN: [id number]    Progress Note   Subjective   Day # 3 CC; sepsis, E. coli bacteremia, elevated LFTs  IV Rocephin  MRI/MRCP last p.m.-impacted stone at the ampulla measuring 8 mm associated with intra and extrahepatic bile duct dilation, cholelithiasis, diffuse hepatic steatosis query early cirrhosis, mild splenomegaly  Labs today-WBC 6.5/hemoglobin 9/hematocrit/28.6/platelets 116 Sodium 133/potassium 4.3/BUN 15/creatinine 0.78 T. bili 1.2/alk phos 197/AST 38/ALT 41  Patient says she feels fine, she has been afebrile, no nausea or vomiting and no complaints of abdominal pain.   Objective   Vital signs in last 24 hours: Temp:  [97.5 F (36.4 C)-99 F (37.2 C)] 98.4 F (36.9 C) (11/21 0445) Pulse Rate:  [81-95] 81 (11/21 0824) Resp:  [17-18] 17 (11/21 0445) BP: (129-138)/(63-69) 133/67 (11/21 0824) SpO2:  [96 %-98 %] 96 % (11/21 0824) Weight:  [75.3 kg] 75.3 kg (11/21 0445) Last BM Date : 04/01/23 General: Elderly white female in NAD Heart:  Regular rate and rhythm; no murmurs Lungs: Respirations even and unlabored, lungs CTA bilaterally Abdomen:  Soft, nontender and nondistended. Normal bowel sounds. Extremities:  Without edema. Neurologic:  Alert and oriented,  grossly normal neurologically. Psych:  Cooperative. Normal mood and affect.  Intake/Output from previous day: 11/20 0701 - 11/21 0700 In: 240 [P.O.:240] Out: 1100 [Urine:1100] Intake/Output this shift: No intake/output data recorded.  Lab Results: Recent Labs    03/31/23 0150 04/01/23 0700 04/02/23 0236  WBC 29.0* 12.0* 6.5  HGB 11.3* 9.8* 9.0*  HCT 34.8* 30.3* 28.6*  PLT 179 135* 116*   BMET Recent Labs    03/31/23 0150 04/01/23 0700 04/02/23 0236  NA 132* 134* 133*  K 3.7 4.2 4.3  CL 98 103 101  CO2 23 26 25   GLUCOSE 96 122* 99  BUN 19 15 15   CREATININE 1.10* 0.80 0.78  CALCIUM 8.6* 8.1* 7.8*   LFT Recent Labs     04/02/23 0236  PROT 4.7*  ALBUMIN 1.9*  AST 38  ALT 41  ALKPHOS 197*  BILITOT 1.2*   PT/INR Recent Labs    03/30/23 1236 04/01/23 2353  LABPROT 16.3* 14.4  INR 1.3* 1.1    Studies/Results: MR ABDOMEN MRCP W WO CONTAST  Result Date: 04/02/2023 CLINICAL DATA:  Jaundice.  History of melanoma. EXAM: MRI ABDOMEN WITHOUT AND WITH CONTRAST (INCLUDING MRCP) TECHNIQUE: Multiplanar multisequence MR imaging of the abdomen was performed both before and after the administration of intravenous contrast. Heavily T2-weighted images of the biliary and pancreatic ducts were obtained, and three-dimensional MRCP images were rendered by post processing. CONTRAST:  8mL GADAVIST GADOBUTROL 1 MMOL/ML IV SOLN COMPARISON:  CT chest, abdomen and pelvis 03/30/2023 FINDINGS: Lower chest: Trace bilateral pleural effusions. Hepatobiliary: There is diffuse hepatic steatosis with relative increased fatty deposition involving the left lobe compared with the right. No focal enhancing liver lesions. There is relative hypertrophy of the lateral segment of left hepatic lobe. Capsular retraction with underlying parenchymal scarring identified within segment 7 of the liver. No discrete enhancing liver lesions identified. Stones identified within the dependent portion of the gallbladder measuring up to 0.9 cm. The gallbladder wall thickness measures 3 mm. No significant pericholecystic fluid. Common bile duct dilatation is identified with moderate increased intrahepatic bile duct dilatation. The CBD measures up to 1.5 cm. Sludge noted within the distal half of the common bile duct. There is an impacted stone at the ampulla measuring 8 mm, image 15/10  and image 34/5. Pancreas: No mass, inflammatory changes, or other parenchymal abnormality identified. Spleen: The spleen measures 11.7 x 5.6 by 12.1 cm. (Volume = 420 cm^3) Adrenals/Urinary Tract: Normal appearance of the adrenal glands. No suspicious mass or hydronephrosis. Small left  kidney cysts are noted. The largest measures 7 mm. No specific follow-up imaging recommended. Stomach/Bowel: Stomach is normal. No dilated loops of large or small bowel. Vascular/Lymphatic: Aortic atherosclerosis. Periportal lymph node measures 1.2 cm, image 78/1102. Other: No ascites. Body wall edema is noted extending along both flanks. Musculoskeletal: No suspicious bone lesions identified. IMPRESSION: 1. Impacted stone at the ampulla measuring 8 mm with associated intra and extrahepatic bile duct dilatation. 2. Cholelithiasis. 3. Diffuse hepatic steatosis with relative increased fatty deposition involving the left lobe compared with the right. There is relative hypertrophy of the lateral segment of left hepatic lobe. Capsular retraction with underlying parenchymal scarring identified within segment 7 of the liver. Imaging findings may be seen in the setting of early cirrhosis. 4. Trace bilateral pleural effusions. 5. Body wall edema extending along both flanks. 6. Mild splenomegaly. 7. Aortic Atherosclerosis (ICD10-I70.0). These results will be called to the ordering clinician or representative by the Radiologist Assistant, and communication documented in the PACS or [employer]. Electronically Signed   By: Signa Kell M.D.   On: 04/02/2023 05:25   MR 3D Recon At Scanner  Result Date: 04/02/2023 CLINICAL DATA:  Jaundice.  History of melanoma. EXAM: MRI ABDOMEN WITHOUT AND WITH CONTRAST (INCLUDING MRCP) TECHNIQUE: Multiplanar multisequence MR imaging of the abdomen was performed both before and after the administration of intravenous contrast. Heavily T2-weighted images of the biliary and pancreatic ducts were obtained, and three-dimensional MRCP images were rendered by post processing. CONTRAST:  8mL GADAVIST GADOBUTROL 1 MMOL/ML IV SOLN COMPARISON:  CT chest, abdomen and pelvis 03/30/2023 FINDINGS: Lower chest: Trace bilateral pleural effusions. Hepatobiliary: There is diffuse hepatic steatosis  with relative increased fatty deposition involving the left lobe compared with the right. No focal enhancing liver lesions. There is relative hypertrophy of the lateral segment of left hepatic lobe. Capsular retraction with underlying parenchymal scarring identified within segment 7 of the liver. No discrete enhancing liver lesions identified. Stones identified within the dependent portion of the gallbladder measuring up to 0.9 cm. The gallbladder wall thickness measures 3 mm. No significant pericholecystic fluid. Common bile duct dilatation is identified with moderate increased intrahepatic bile duct dilatation. The CBD measures up to 1.5 cm. Sludge noted within the distal half of the common bile duct. There is an impacted stone at the ampulla measuring 8 mm, image 15/10 and image 34/5. Pancreas: No mass, inflammatory changes, or other parenchymal abnormality identified. Spleen: The spleen measures 11.7 x 5.6 by 12.1 cm. (Volume = 420 cm^3) Adrenals/Urinary Tract: Normal appearance of the adrenal glands. No suspicious mass or hydronephrosis. Small left kidney cysts are noted. The largest measures 7 mm. No specific follow-up imaging recommended. Stomach/Bowel: Stomach is normal. No dilated loops of large or small bowel. Vascular/Lymphatic: Aortic atherosclerosis. Periportal lymph node measures 1.2 cm, image 78/1102. Other: No ascites. Body wall edema is noted extending along both flanks. Musculoskeletal: No suspicious bone lesions identified. IMPRESSION: 1. Impacted stone at the ampulla measuring 8 mm with associated intra and extrahepatic bile duct dilatation. 2. Cholelithiasis. 3. Diffuse hepatic steatosis with relative increased fatty deposition involving the left lobe compared with the right. There is relative hypertrophy of the lateral segment of left hepatic lobe. Capsular retraction with underlying parenchymal scarring identified within segment  7 of the liver. Imaging findings may be seen in the setting of  early cirrhosis. 4. Trace bilateral pleural effusions. 5. Body wall edema extending along both flanks. 6. Mild splenomegaly. 7. Aortic Atherosclerosis (ICD10-I70.0). These results will be called to the ordering clinician or representative by the Radiologist Assistant, and communication documented in the PACS or [employer]. Electronically Signed   By: Signa Kell M.D.   On: 04/02/2023 05:25   ECHOCARDIOGRAM COMPLETE  Result Date: 03/31/2023    ECHOCARDIOGRAM REPORT   Patient Name:   Elizabeth Norman Date of Exam: 03/31/2023 Medical Rec #:  [id number]     Height:       62.2 in Accession #:    [id number]    Weight:       177.5 lb Date of Birth:  02/05/1941      BSA:          1.822 m Patient Age:    82 years      BP:           104/54 mmHg Patient Gender: F             HR:           99 bpm. Exam Location:  Inpatient Procedure: 2D Echo, Cardiac Doppler, Color Doppler and Intracardiac            Opacification Agent Indications:    Dyspnea R06.00  History:        Patient has prior history of Echocardiogram examinations, most                 recent 06/04/2021. Previous Myocardial Infarction; Risk                 Factors:Hypertension.  Sonographer:    Lucendia Herrlich RCS Referring Phys: Elenore Paddy REESE IMPRESSIONS  1. Left ventricular ejection fraction, by estimation, is 60 to 65%. The left ventricle has normal function. The left ventricle has no regional wall motion abnormalities. There is mild left ventricular hypertrophy of the basal-septal segment. Left ventricular diastolic parameters are consistent with Grade I diastolic dysfunction (impaired relaxation).  2. Right ventricular systolic function is normal. The right ventricular size is normal. There is normal pulmonary artery systolic pressure. The estimated right ventricular systolic pressure is 28.6 mmHg.  3. The mitral valve is degenerative. No evidence of mitral valve regurgitation. No evidence of mitral stenosis.  4. The aortic valve is tricuspid. Aortic  valve regurgitation is trivial. Aortic valve sclerosis/calcification is present, without any evidence of aortic stenosis. Aortic valve Vmax measures 1.51 m/s.  5. The inferior vena cava is normal in size with greater than 50% respiratory variability, suggesting right atrial pressure of 3 mmHg. FINDINGS  Left Ventricle: Left ventricular ejection fraction, by estimation, is 60 to 65%. The left ventricle has normal function. The left ventricle has no regional wall motion abnormalities. Definity contrast agent was given IV to delineate the left ventricular  endocardial borders. The left ventricular internal cavity size was normal in size. There is mild left ventricular hypertrophy of the basal-septal segment. Left ventricular diastolic parameters are consistent with Grade I diastolic dysfunction (impaired relaxation). Normal left ventricular filling pressure. Right Ventricle: The right ventricular size is normal. No increase in right ventricular wall thickness. Right ventricular systolic function is normal. There is normal pulmonary artery systolic pressure. The tricuspid regurgitant velocity is 2.53 m/s, and  with an assumed right atrial pressure of 3 mmHg, the estimated right ventricular systolic pressure is 28.6 mmHg.  Left Atrium: Left atrial size was normal in size. Right Atrium: Right atrial size was normal in size. Pericardium: There is no evidence of pericardial effusion. Mitral Valve: The mitral valve is degenerative in appearance. Mild to moderate mitral annular calcification. No evidence of mitral valve regurgitation. No evidence of mitral valve stenosis. Tricuspid Valve: The tricuspid valve is normal in structure. Tricuspid valve regurgitation is trivial. No evidence of tricuspid stenosis. Aortic Valve: The aortic valve is tricuspid. Aortic valve regurgitation is trivial. Aortic valve sclerosis/calcification is present, without any evidence of aortic stenosis. Aortic valve peak gradient measures 9.1 mmHg.  Pulmonic Valve: The pulmonic valve was normal in structure. Pulmonic valve regurgitation is not visualized. No evidence of pulmonic stenosis. Aorta: The aortic root is normal in size and structure. Venous: The inferior vena cava is normal in size with greater than 50% respiratory variability, suggesting right atrial pressure of 3 mmHg. IAS/Shunts: No atrial level shunt detected by color flow Doppler.  LEFT VENTRICLE PLAX 2D LVIDd:         4.50 cm   Diastology LVIDs:         3.30 cm   LV e' lateral:   6.85 cm/s LV PW:         1.10 cm   LV E/e' lateral: 9.6 LV IVS:        1.10 cm LVOT diam:     1.90 cm LV SV:         54 LV SV Index:   30 LVOT Area:     2.84 cm  RIGHT VENTRICLE             IVC RV S prime:     15.10 cm/s  IVC diam: 1.50 cm TAPSE (M-mode): 1.7 cm LEFT ATRIUM             Index        RIGHT ATRIUM           Index LA diam:        3.30 cm 1.81 cm/m   RA Area:     10.20 cm LA Vol (A2C):   40.3 ml 22.12 ml/m  RA Volume:   15.60 ml  8.56 ml/m LA Vol (A4C):   40.7 ml 22.34 ml/m LA Biplane Vol: 42.3 ml 23.22 ml/m  AORTIC VALVE AV Area (Vmax): 2.19 cm AV Vmax:        151.00 cm/s AV Peak Grad:   9.1 mmHg LVOT Vmax:      116.67 cm/s LVOT Vmean:     78.633 cm/s LVOT VTI:       0.190 m  AORTA Ao Root diam: 3.10 cm Ao Asc diam:  3.40 cm MITRAL VALVE               TRICUSPID VALVE MV Area (PHT): 4.36 cm    TR Peak grad:   25.6 mmHg MV Decel Time: 174 msec    TR Vmax:        253.00 cm/s MR Peak grad: 22.8 mmHg MR Vmax:      239.00 cm/s  SHUNTS MV E velocity: 65.60 cm/s  Systemic VTI:  0.19 m MV A velocity: 88.60 cm/s  Systemic Diam: 1.90 cm MV E/A ratio:  0.74 Armanda Magic MD Electronically signed by Armanda Magic MD Signature Date/Time: 03/31/2023/7:35:40 PM    Final       Assessment / Plan:   #71  82 year old white female with metastatic melanoma, undergoing chemo/immunotherapy with evidence of prior some progression of disease at  last PET scan  #2 admitted with sepsis-E. coli bacteremia, then CT imaging  showed cholelithiasis, some progressive metastatic adenopathy in the right external iliac and inguinal regions, progressive intrahepatic and extrahepatic biliary ductal dilation no definite stones seen on CT  MRI/MRCP last p.m. does show a stone impacted in the distal common bile duct measuring 8 mm, and cholelithiasis.,  Body wall edema along both flanks, diffuse hepatic steatosis and query early cirrhosis  She has been on IV Rocephin, she is feeling well LFTs have trended down T. bili 1.2  #2  Acute kidney injury secondary to above improved #3  Coronary artery disease #4  Anemia/thrombocytopenia  Plan:  keep n.p.o. this morning Patient has been scheduled for ERCP with stone extraction with Dr. Russella Dar for 12:30 PM today.  Procedure was discussed in detail with the patient including indications risks and benefits, we reviewed biliary images, and specifically discussed complications including 4 to 5% risk of pancreatitis, bleeding, perforation, and anesthesia complications.  Patient is agreeable to proceed  She has been on subcu heparin, (last does 6 am)-holding subcu heparin for at least 24 hours Continue IV Rocephin, dose to be given before ERCP  Further recommendations pending ERCP Will need to consider laparoscopic cholecystectomy, however may need to have further conversation with her oncologist regarding timing etc. as she is missing chemo which was scheduled to be today    Principal Problem:   Sepsis (HCC) Active Problems:   E coli bacteremia   Elevated liver enzymes    LOS: 3 days   Amy EsterwoodPA-C  04/02/2023, 9:25 AM   Attending Physician Note   I have taken an interval history, reviewed the chart and examined the patient. I performed a substantive portion of this encounter, including complete performance of at least one of the key components, in conjunction with the APP. I agree with the APP's note, impression and recommendations with my edits. My additional impressions  and recommendations are as follows.   MRCP showed an 8 mm CBD stone, diffuse biliary tree dilation, cholelithiasis. ERCP today.   Claudette Head, MD [facility] See AM[facility], [facility], for our on call provider

## 2023-04-02 NOTE — Transfer of Care (Signed)
Immediate Anesthesia Transfer of Care Note  Patient: Elizabeth Norman  Procedure(s) Performed: ENDOSCOPIC RETROGRADE CHOLANGIOPANCREATOGRAPHY (ERCP)  Patient Location: Endoscopy Unit  Anesthesia Type:General  Level of Consciousness: awake, alert , and oriented  Airway & Oxygen Therapy: Patient Spontanous Breathing and Patient connected to face mask oxygen  Post-op Assessment: Report given to RN and Post -op Vital signs reviewed and stable  Post vital signs: Reviewed and stable  Last Vitals:  Vitals Value Taken Time  BP 97/57 04/02/23 1354  Temp 36.1 C 04/02/23 1353  Pulse 83 04/02/23 1356  Resp 17 04/02/23 1356  SpO2 96 % 04/02/23 1356  Vitals shown include unfiled device data.  Last Pain:  Vitals:   04/02/23 1353  TempSrc: Tympanic  PainSc: 0-No pain         Complications: No notable events documented.

## 2023-04-03 ENCOUNTER — Ambulatory Visit: Payer: Medicare Other | Attending: Cardiology | Admitting: Cardiology

## 2023-04-03 ENCOUNTER — Encounter: Payer: Self-pay | Admitting: Cardiology

## 2023-04-03 VITALS — BP 138/68 | HR 86 | Resp 17 | Ht 62.0 in | Wt 178.0 lb

## 2023-04-03 DIAGNOSIS — K8033 Calculus of bile duct with acute cholangitis with obstruction: Secondary | ICD-10-CM | POA: Diagnosis not present

## 2023-04-03 DIAGNOSIS — T50905D Adverse effect of unspecified drugs, medicaments and biological substances, subsequent encounter: Secondary | ICD-10-CM | POA: Diagnosis not present

## 2023-04-03 DIAGNOSIS — N179 Acute kidney failure, unspecified: Secondary | ICD-10-CM | POA: Diagnosis not present

## 2023-04-03 DIAGNOSIS — E440 Moderate protein-calorie malnutrition: Secondary | ICD-10-CM | POA: Insufficient documentation

## 2023-04-03 DIAGNOSIS — I514 Myocarditis, unspecified: Secondary | ICD-10-CM | POA: Diagnosis not present

## 2023-04-03 DIAGNOSIS — Z01810 Encounter for preprocedural cardiovascular examination: Secondary | ICD-10-CM | POA: Insufficient documentation

## 2023-04-03 DIAGNOSIS — B9620 Unspecified Escherichia coli [E. coli] as the cause of diseases classified elsewhere: Secondary | ICD-10-CM | POA: Diagnosis not present

## 2023-04-03 DIAGNOSIS — C4371 Malignant melanoma of right lower limb, including hip: Secondary | ICD-10-CM | POA: Diagnosis not present

## 2023-04-03 DIAGNOSIS — R579 Shock, unspecified: Secondary | ICD-10-CM | POA: Diagnosis not present

## 2023-04-03 LAB — CBC
HCT: 30.4 % — ABNORMAL LOW (ref 36.0–46.0)
Hemoglobin: 9.3 g/dL — ABNORMAL LOW (ref 12.0–15.0)
MCH: 27.8 pg (ref 26.0–34.0)
MCHC: 30.6 g/dL (ref 30.0–36.0)
MCV: 90.7 fL (ref 80.0–100.0)
Platelets: 116 10*3/uL — ABNORMAL LOW (ref 150–400)
RBC: 3.35 MIL/uL — ABNORMAL LOW (ref 3.87–5.11)
RDW: 14.1 % (ref 11.5–15.5)
WBC: 5 10*3/uL (ref 4.0–10.5)
nRBC: 0 % (ref 0.0–0.2)

## 2023-04-03 LAB — COMPREHENSIVE METABOLIC PANEL
ALT: 32 U/L (ref 0–44)
AST: 24 U/L (ref 15–41)
Albumin: 1.9 g/dL — ABNORMAL LOW (ref 3.5–5.0)
Alkaline Phosphatase: 187 U/L — ABNORMAL HIGH (ref 38–126)
Anion gap: 6 (ref 5–15)
BUN: 15 mg/dL (ref 8–23)
CO2: 25 mmol/L (ref 22–32)
Calcium: 7.4 mg/dL — ABNORMAL LOW (ref 8.9–10.3)
Chloride: 102 mmol/L (ref 98–111)
Creatinine, Ser: 0.68 mg/dL (ref 0.44–1.00)
GFR, Estimated: >60 mL/min (ref >60)
Glucose, Bld: 173 mg/dL — ABNORMAL HIGH (ref 70–99)
Potassium: 4.6 mmol/L (ref 3.5–5.1)
Sodium: 133 mmol/L — ABNORMAL LOW (ref 135–145)
Total Bilirubin: 1.3 mg/dL — ABNORMAL HIGH (ref <1.2)
Total Protein: 5 g/dL — ABNORMAL LOW (ref 6.5–8.1)

## 2023-04-03 MED ORDER — METOPROLOL TARTRATE 25 MG PO TABS: 25.0000 mg | ORAL_TABLET | Freq: Two times a day (BID) | ORAL | Status: AC

## 2023-04-03 MED ORDER — BOOST / RESOURCE BREEZE PO LIQD CUSTOM
1.0000 | Freq: Three times a day (TID) | ORAL | Status: DC
  Administered 2023-04-03: 1 via ORAL

## 2023-04-03 MED ORDER — ONDANSETRON HCL 4 MG PO TABS: 4.0000 mg | ORAL_TABLET | Freq: Three times a day (TID) | ORAL | 0 refills | Status: DC | PRN

## 2023-04-03 MED ORDER — HEPARIN SOD (PORK) LOCK FLUSH 100 UNIT/ML IV SOLN
500.0000 [IU] | INTRAVENOUS | Status: AC | PRN
  Administered 2023-04-03: 500 [IU]

## 2023-04-03 MED ORDER — CEPHALEXIN 500 MG PO CAPS: 1000.0000 mg | ORAL_CAPSULE | Freq: Two times a day (BID) | ORAL | 0 refills | Status: AC

## 2023-04-03 MED ORDER — LEVOTHYROXINE SODIUM 125 MCG PO TABS: 125.0000 ug | ORAL_TABLET | Freq: Every day | ORAL | 3 refills | Status: DC

## 2023-04-03 NOTE — Progress Notes (Addendum)
Patient ID: Elizabeth Norman, female   DOB: 1940-12-20, 82 y.o.   MRN: [id number]     Attending physician's note   I have taken a history, reviewed the chart, and examined the patient. I performed a substantive portion of this encounter, including complete performance of at least one of the key components, in conjunction with the APP. I agree with the APP's note, impression, and recommendations with my edits.   Shahram Alexopoulos, DO, FACG [phone] office          Progress Note   Subjective   Day # 4 Cc; E. coli bacteremia, cholelithiasis and choledocholithiasis/cholangitis  ERCP yesterday-CBD 15 mm, common bile duct contained multiple filling defects consistent with stones and sludge, sphincterotomy was done biliary tree swept several times all stones were removed, excellent biliary drainage Oozing from the sphincterotomy site and also noted a periampullary diverticulum and atrophic appearing gastric mucosa  Labs today-WBC 5.0/hemoglobin 9.3/hematocrit 30.4 stable BUN 15/creatinine 0.68 T. bili 1.3/alk phos 187/AST 24/ALT 32-continued improvement  Has seen the patient this morning, she does not want to proceed with laparoscopic cholecystectomy to be now, needs clearance prior to surgery, and patient prefers to have surgery at [facility] if possible  Patient is sitting up in the chair, dressed, hoping for discharge Tolerating solid food without difficulty, no abdominal pain postprocedure yesterday, says she feels good   Objective   Vital signs in last 24 hours: Temp:  [96.6 F (35.9 C)-98 F (36.7 C)] 98 F (36.7 C) (11/22 0744) Pulse Rate:  [66-93] 67 (11/22 0744) Resp:  [12-20] 18 (11/22 0744) BP: (97-136)/(47-82) 118/47 (11/22 0744) SpO2:  [95 %-98 %] 98 % (11/22 0744) Weight:  [76.7 kg-80.5 kg] 80.5 kg (11/22 0500) Last BM Date : 04/01/23 General: elderly    white female in NAD Heart:  Regular rate and rhythm; no murmurs Lungs: Respirations even and unlabored, lungs  CTA bilaterally Abdomen:  Soft, nontender and nondistended. Normal bowel sounds. Extremities:  Without edema. Neurologic:  Alert and oriented,  grossly normal neurologically. Psych:  Cooperative. Normal mood and affect.  Intake/Output from previous day: 11/21 0701 - 11/22 0700 In: 350 [I.V.:350] Out: -  Intake/Output this shift: No intake/output data recorded.  Lab Results: Recent Labs    04/01/23 0700 04/02/23 0236 04/03/23 0345  WBC 12.0* 6.5 5.0  HGB 9.8* 9.0* 9.3*  HCT 30.3* 28.6* 30.4*  PLT 135* 116* 116*   BMET Recent Labs    04/01/23 0700 04/02/23 0236 04/03/23 0345  NA 134* 133* 133*  K 4.2 4.3 4.6  CL 103 101 102  CO2 26 25 25   GLUCOSE 122* 99 173*  BUN 15 15 15   CREATININE 0.80 0.78 0.68  CALCIUM 8.1* 7.8* 7.4*   LFT Recent Labs    04/03/23 0345  PROT 5.0*  ALBUMIN 1.9*  AST 24  ALT 32  ALKPHOS 187*  BILITOT 1.3*   PT/INR Recent Labs    04/01/23 2353  LABPROT 14.4  INR 1.1    Studies/Results: DG ERCP  Result Date: 04/02/2023 CLINICAL DATA:  Obstructing choledocholithiasis with ductal dilatation EXAM: ERCP TECHNIQUE: Multiple spot images obtained with the fluoroscopic device and submitted for interpretation post-procedure. COMPARISON:  MR 04/01/2023 FINDINGS: A series of fluoroscopic spot images document endoscopic cannulation and opacification of the CBD with balloon catheter placement. The CBD CBD is ectatic. No extravasation. Visualized intrahepatic ducts are mildly ectatic centrally. IMPRESSION: Endoscopic CBD cannulation and intervention as above. These images were submitted for radiologic interpretation only. Please  see the procedural report for the amount of contrast and the fluoroscopy time utilized. Electronically Signed   By: Corlis Leak M.D.   On: 04/02/2023 19:11   DG C-Arm 1-60 Min-No Report  Result Date: 04/02/2023 Fluoroscopy was utilized by the requesting physician.  No radiographic interpretation.   MR ABDOMEN MRCP W WO  CONTAST  Result Date: 04/02/2023 CLINICAL DATA:  Jaundice.  History of melanoma. EXAM: MRI ABDOMEN WITHOUT AND WITH CONTRAST (INCLUDING MRCP) TECHNIQUE: Multiplanar multisequence MR imaging of the abdomen was performed both before and after the administration of intravenous contrast. Heavily T2-weighted images of the biliary and pancreatic ducts were obtained, and three-dimensional MRCP images were rendered by post processing. CONTRAST:  8mL GADAVIST GADOBUTROL 1 MMOL/ML IV SOLN COMPARISON:  CT chest, abdomen and pelvis 03/30/2023 FINDINGS: Lower chest: Trace bilateral pleural effusions. Hepatobiliary: There is diffuse hepatic steatosis with relative increased fatty deposition involving the left lobe compared with the right. No focal enhancing liver lesions. There is relative hypertrophy of the lateral segment of left hepatic lobe. Capsular retraction with underlying parenchymal scarring identified within segment 7 of the liver. No discrete enhancing liver lesions identified. Stones identified within the dependent portion of the gallbladder measuring up to 0.9 cm. The gallbladder wall thickness measures 3 mm. No significant pericholecystic fluid. Common bile duct dilatation is identified with moderate increased intrahepatic bile duct dilatation. The CBD measures up to 1.5 cm. Sludge noted within the distal half of the common bile duct. There is an impacted stone at the ampulla measuring 8 mm, image 15/10 and image 34/5. Pancreas: No mass, inflammatory changes, or other parenchymal abnormality identified. Spleen: The spleen measures 11.7 x 5.6 by 12.1 cm. (Volume = 420 cm^3) Adrenals/Urinary Tract: Normal appearance of the adrenal glands. No suspicious mass or hydronephrosis. Small left kidney cysts are noted. The largest measures 7 mm. No specific follow-up imaging recommended. Stomach/Bowel: Stomach is normal. No dilated loops of large or small bowel. Vascular/Lymphatic: Aortic atherosclerosis. Periportal lymph  node measures 1.2 cm, image 78/1102. Other: No ascites. Body wall edema is noted extending along both flanks. Musculoskeletal: No suspicious bone lesions identified. IMPRESSION: 1. Impacted stone at the ampulla measuring 8 mm with associated intra and extrahepatic bile duct dilatation. 2. Cholelithiasis. 3. Diffuse hepatic steatosis with relative increased fatty deposition involving the left lobe compared with the right. There is relative hypertrophy of the lateral segment of left hepatic lobe. Capsular retraction with underlying parenchymal scarring identified within segment 7 of the liver. Imaging findings may be seen in the setting of early cirrhosis. 4. Trace bilateral pleural effusions. 5. Body wall edema extending along both flanks. 6. Mild splenomegaly. 7. Aortic Atherosclerosis (ICD10-I70.0). These results will be called to the ordering clinician or representative by the Radiologist Assistant, and communication documented in the PACS or [employer]. Electronically Signed   By: Signa Kell M.D.   On: 04/02/2023 05:25   MR 3D Recon At Scanner  Result Date: 04/02/2023 CLINICAL DATA:  Jaundice.  History of melanoma. EXAM: MRI ABDOMEN WITHOUT AND WITH CONTRAST (INCLUDING MRCP) TECHNIQUE: Multiplanar multisequence MR imaging of the abdomen was performed both before and after the administration of intravenous contrast. Heavily T2-weighted images of the biliary and pancreatic ducts were obtained, and three-dimensional MRCP images were rendered by post processing. CONTRAST:  8mL GADAVIST GADOBUTROL 1 MMOL/ML IV SOLN COMPARISON:  CT chest, abdomen and pelvis 03/30/2023 FINDINGS: Lower chest: Trace bilateral pleural effusions. Hepatobiliary: There is diffuse hepatic steatosis with relative increased fatty deposition involving the  left lobe compared with the right. No focal enhancing liver lesions. There is relative hypertrophy of the lateral segment of left hepatic lobe. Capsular retraction with underlying  parenchymal scarring identified within segment 7 of the liver. No discrete enhancing liver lesions identified. Stones identified within the dependent portion of the gallbladder measuring up to 0.9 cm. The gallbladder wall thickness measures 3 mm. No significant pericholecystic fluid. Common bile duct dilatation is identified with moderate increased intrahepatic bile duct dilatation. The CBD measures up to 1.5 cm. Sludge noted within the distal half of the common bile duct. There is an impacted stone at the ampulla measuring 8 mm, image 15/10 and image 34/5. Pancreas: No mass, inflammatory changes, or other parenchymal abnormality identified. Spleen: The spleen measures 11.7 x 5.6 by 12.1 cm. (Volume = 420 cm^3) Adrenals/Urinary Tract: Normal appearance of the adrenal glands. No suspicious mass or hydronephrosis. Small left kidney cysts are noted. The largest measures 7 mm. No specific follow-up imaging recommended. Stomach/Bowel: Stomach is normal. No dilated loops of large or small bowel. Vascular/Lymphatic: Aortic atherosclerosis. Periportal lymph node measures 1.2 cm, image 78/1102. Other: No ascites. Body wall edema is noted extending along both flanks. Musculoskeletal: No suspicious bone lesions identified. IMPRESSION: 1. Impacted stone at the ampulla measuring 8 mm with associated intra and extrahepatic bile duct dilatation. 2. Cholelithiasis. 3. Diffuse hepatic steatosis with relative increased fatty deposition involving the left lobe compared with the right. There is relative hypertrophy of the lateral segment of left hepatic lobe. Capsular retraction with underlying parenchymal scarring identified within segment 7 of the liver. Imaging findings may be seen in the setting of early cirrhosis. 4. Trace bilateral pleural effusions. 5. Body wall edema extending along both flanks. 6. Mild splenomegaly. 7. Aortic Atherosclerosis (ICD10-I70.0). These results will be called to the ordering clinician or  representative by the Radiologist Assistant, and communication documented in the PACS or [employer]. Electronically Signed   By: Signa Kell M.D.   On: 04/02/2023 05:25       Assessment / Plan:    #46 82 year old white female with known metastatic melanoma on chemo/immune therapy with most recent pet imaging showing progression of disease with right femoral osseous metastatic disease and new right pelvic nodal mets  Admitted 4 days ago with sepsis, and T. bili noted to be 6. LFTs were completely normal on 03/25/2023. Blood cultures positive for E. Coli  Imaging with CT, then MRI/MRCP showing cholelithiasis, and.  To be an 8 mm stone impacted at the ampulla, diffuse hepatic steatosis could not rule out early cirrhosis and mild splenomegaly  ERCP yesterday with successful stone extraction with finding of multiple common bile duct stones and sludge, sphincterotomy was done, duct was swept clear, all stones and sludge removed  Day #5 IV antibiotics  LFTs continue to improve today  #2 acute kidney injury secondary to sepsis-resolved #3.  Coronary artery disease #4.  Anemia and mild thrombocytopenia chronic stable  Plan; patient is stable from GI perspective She was seen by surgery today but does not want to have surgery during this admission, and plans to follow-up with a surgeon, her oncologist and cardiology as an outpatient prior to that decision  Okay to transition to oral antibiotics  GI will sign off, available if needed.      Principal Problem:   Sepsis (HCC) Active Problems:   E coli bacteremia   Elevated liver enzymes   Calculus of bile duct with acute cholangitis with obstruction   Elevated LFTs  Abnormal magnetic resonance cholangiopancreatography (MRCP)   Atrophic gastritis without hemorrhage   Acute duodenal ulcer with bleeding   Malnutrition of moderate degree     LOS: 4 days   Amy EsterwoodPA-C  04/03/2023, 11:01 AM

## 2023-04-03 NOTE — Anesthesia Postprocedure Evaluation (Signed)
Anesthesia Post Note  Patient: Elizabeth Norman  Procedure(s) Performed: ENDOSCOPIC RETROGRADE CHOLANGIOPANCREATOGRAPHY (ERCP) SPHINCTEROTOMY SCLEROTHERAPY BIOPSY REMOVAL OF STONES     Patient location during evaluation: PACU Anesthesia Type: General Level of consciousness: awake and alert Pain management: pain level controlled Vital Signs Assessment: post-procedure vital signs reviewed and stable Respiratory status: spontaneous breathing, nonlabored ventilation, respiratory function stable and patient connected to nasal cannula oxygen Cardiovascular status: blood pressure returned to baseline and stable Postop Assessment: no apparent nausea or vomiting Anesthetic complications: no   No notable events documented.  Last Vitals:  Vitals:   04/03/23 0300 04/03/23 0744  BP: 122/61 (!) 118/47  Pulse: 66 67  Resp: 18 18  Temp: (!) 36.3 C 36.7 C  SpO2: 97% 98%    Last Pain:  Vitals:   04/03/23 0300  TempSrc: Oral  PainSc:                  [place]

## 2023-04-03 NOTE — Discharge Summary (Signed)
Physician Discharge Summary   Patient: Elizabeth Norman MRN: [id number] DOB: November 07, 1940  Admit date:     03/30/2023  Discharge date: 04/03/23  Discharge Physician: Thad Ranger, MD    PCP: [name], MD   Recommendations at discharge:    Continue Keflex 1000 mg twice daily for 2 more days to complete course Outpatient cholecystectomy will be planned by surgery per patient's request.  Cardiology, Dr. Rosemary Holms notified for preop clearance  Discharge Diagnoses:  Septic shock   E coli bacteremia Transaminitis with hyperbilirubinemia   Calculus of bile duct with acute cholangitis with obstruction Symptomatic cholelithiasis Acute knee injury with lactic acidosis Hyponatremia Hypokalemia Elevated troponin likely due to demand ischemia Coronary artery disease Hypothyroidism Obesity     Hospital Course:  Patient is 82 year old woman who presented to [facility] ED 11/18 with chills, SOB, dizziness, lightheadedness. 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    On presentation to ED, labs demonstrated AKI, AGMA, elevated BNP, WBC 21 and LA 7.5. Patient was hypotensive requiring Levophed initiation with  concern for developing septic shock; broad-spectrum antibiotics were started (vanc/cefepime/Flagyl). CXR unremarkable. CT Head NAICA. CT Chest/A/P demonstrated unchanged splenomegaly, no acute GU findings, +progressive intrahepatic and extrahepatic biliary duct dilation but no history of choledocholithiasis, progressive metastatic adenopathy of the R pelvis and inguinal region. RUQ US demonstrated cholelithiasis without evidence of acute cholecystitis, CBD dilatation of 10mm.  PCCM was consulted for ICU admission to [facility]. [facility] assumed care on 11/20  Assessment and Plan:   Septic shock,  immunocompromised host E. coli bacteremia,  choledocholithiasis, cholangitis -Patient presented from [facility] ED with septic shock, leukocytosis, lactic acidosis, hypotensive, BP 60/38.  She was transferred to [facility] ICU and was placed on vasopressors. -She was placed on empiric vancomycin, cefepime, Flagyl - CT Chest/A/P showed intrahepatic and extrahepatic biliary duct dilation, cholelithiasis without cholecystitis.  No evidence of choledocholithiasis.  Progressive metastatic adenopathy in the right external iliac and inguinal region consistent with worsening metastatic melanoma -Blood cultures 11/18 positive for E. Coli, antibiotics were narrowed to IV ceftriaxone -Abdominal ultrasound showed cholelithiasis, no acute cholecystitis, CBD dilated measuring 10 mm, recommending MRCP or ERCP -GI consulted, MRCP showed impacted stone at the ampulla measuring 8 mm with associated intra and extrahepatic bile duct dilation, cholelithiasis, diffuse hepatic steatosis -Underwent ERCP on 11/21 showed filling defects consistent with small stones and sludge on the cholangiogram, entire biliary tree was moderately dilated, choledocholithiasis found, complete removal was accomplished by biliary sphincterotomy and extraction - general surgery consulted for consideration for cholecystectomy, patient prefers to have outpatient cholecystectomy at [facility] -Cardiology, Dr Rosemary Holms was consulted for preop clearance prior to the surgery     Transaminitis with hyperbilirubinemia Symptomatic cholelithiasis, cholangitis -CT abdomen pelvis showed intrahepatic and extrahepatic biliary duct dilation, cholelithiasis without cholecystitis, no choledocholithiasis.   -Abdominal ultrasound showed cholelithiasis, no acute cholecystitis, CBD dilated measuring 10 mm  -GI consulted.  MRCP showed impacted stone at the ampulla with associated intra and extrahepatic bile duct dilation, cholelithiasis -Status post ERCP, complete removal of choledocholithiasis by  biliary sphincterotomy and extraction -Day #5 of IV Rocephin today, patient to continue 2 more days of oral Keflex to complete 7-day course for gram-negative bacteremia   Acute kidney injury with lactic acidosis -Likely prerenal versus septic ATN  -Presented with creatinine of 1.62, creatinine was 0.8 on 03/25/2023 -Was placed on IV fluid resuscitation and  vasopressors.   -Creatinine on 2-0.6   Hyponatremia, hypokalemia -Presented with sodium of 132 trended down to 130 likely due to #1 -Stable, 133   Elevated Troponin/demand ischemia, CAD -Likely due to demand ischemia. -2D echo 11/19 showed EF of 60 to 65%, mild LVH, no regional wall motion abnormalities, G1 DD right ventricular systolic function normal.  -Follow-up with cardiology outpatient    Malignant melanoma  Immunotherapy myocarditis  - Port-a-cath in place; on ipilimumab and pembrolizumab, last treated 1 week PTA. - Management per Oncology (followed by Dr. Ellin Saba) - was due to have chemo this week, currently on hold    Hypothyroidism -TSH 0.173, free T4 elevated 1.4 -Decrease Synthroid to 125 mcg daily   Obesity   Estimated body mass index is 30.91 kg/m as calculated from the following:   Height as of this encounter: 5\' 2"  (1.575 m).   Weight as of this encounter: 76.7 kg.       Pain control - [employer] Controlled Substance Reporting System database was reviewed. and patient was instructed, not to drive, operate heavy machinery, perform activities at heights, swimming or participation in water activities or provide baby-sitting services while on Pain, Sleep and Anxiety Medications; until their outpatient Physician has advised to do so again. Also recommended to not to take more than prescribed Pain, Sleep and Anxiety Medications.  Consultants: Patient was admitted by CCM, gastroenterology, surgery, cardiology Procedures performed: ERCP Disposition: Home Diet recommendation: Soft diet  DISCHARGE  MEDICATION: Allergies as of 04/03/2023       Reactions   Tape    Pulls skin, please use paper tape        Medication List     TAKE these medications    ALPRAZolam 0.5 MG tablet Commonly known as: XANAX Take 0.5 mg by mouth 3 (three) times daily.   calcium carbonate 1250 (500 Ca) MG tablet Commonly known as: OS-CAL - dosed in mg of elemental calcium Take 1 tablet by mouth 3 (three) times a week.   cephALEXin 500 MG capsule Commonly known as: KEFLEX Take 2 capsules (1,000 mg total) by mouth 2 (two) times daily for 2 days. With food. Start taking on: April 04, 2023   cholecalciferol 1000 units tablet Commonly known as: VITAMIN D Take 1,000 Units by mouth 3 (three) times a week.   famotidine 40 MG tablet Commonly known as: PEPCID Take 40 mg by mouth daily.   levothyroxine 125 MCG tablet Commonly known as: SYNTHROID Take 1 tablet (125 mcg total) by mouth daily before breakfast. What changed:  medication strength how much to take   metoprolol tartrate 25 MG tablet Commonly known as: LOPRESSOR Take 1 tablet (25 mg total) by mouth 2 (two) times daily. Hold until follow-up with your doctor/cardiologist What changed: additional instructions   omeprazole 40 MG capsule Commonly known as: PRILOSEC Take 40 mg by mouth daily.   ondansetron 4 MG tablet Commonly known as: Zofran Take 1 tablet (4 mg total) by mouth every 8 (eight) hours as needed for nausea or vomiting.   prednisoLONE acetate 1 % ophthalmic suspension Commonly known as: PRED FORTE Place 1 drop into both eyes daily. Instill one drop into the right eye twice daily and one drop into the left eye once daily.   VITAMIN B-12 PO Take 1 tablet by mouth once a week.        Follow-up Information     [name], MD. Schedule an appointment as soon as possible for a visit in 2  week(s).   Specialty: Internal Medicine Why: for hospital follow-up Contact information: [address] [place]  [address] [phone]         [name], MD Follow up on 04/03/2023.   Specialties: Cardiology, Radiology Why: at 12:45, for hospital follow-up Contact information: [address] [place] [address] [phone]                Discharge Exam: Ceasar Mons Weights   04/02/23 0445 04/02/23 1213 04/03/23 0500  Weight: 75.3 kg 76.7 kg 80.5 kg   S: No acute complaints, wants to go home.  Liver function tests have improved.  Does not want cholecystectomy at this moment.  BP (!) 118/47 (BP Location: Left Arm)   Pulse 67   Temp 98 F (36.7 C)   Resp 18   Ht 5\' 2"  (1.575 m)   Wt 80.5 kg   SpO2 98%   BMI 32.46 kg/m   Physical Exam General: Alert and oriented x 3, NAD Cardiovascular: S1 S2 clear, RRR.  Respiratory: CTAB, no wheezing, rales or rhonchi Gastrointestinal: Soft, nontender, nondistended, NBS Ext: no pedal edema bilaterally Neuro: no new deficits Skin: No rashes Psych: Normal affect and demeanor, alert and oriented x3    Condition at discharge: fair  The results of significant diagnostics from this hospitalization (including imaging, microbiology, ancillary and laboratory) are listed below for reference.   Imaging Studies: DG ERCP  Result Date: 04/02/2023 CLINICAL DATA:  Obstructing choledocholithiasis with ductal dilatation EXAM: ERCP TECHNIQUE: Multiple spot images obtained with the fluoroscopic device and submitted for interpretation post-procedure. COMPARISON:  MR 04/01/2023 FINDINGS: A series of fluoroscopic spot images document endoscopic cannulation and opacification of the CBD with balloon catheter placement. The CBD CBD is ectatic. No extravasation. Visualized intrahepatic ducts are mildly ectatic centrally. IMPRESSION: Endoscopic CBD cannulation and intervention as above. These images were submitted for radiologic interpretation only. Please see the procedural report for the amount of contrast and the fluoroscopy time  utilized. Electronically Signed   By: Corlis Leak M.D.   On: 04/02/2023 19:11   DG C-Arm 1-60 Min-No Report  Result Date: 04/02/2023 Fluoroscopy was utilized by the requesting physician.  No radiographic interpretation.   MR ABDOMEN MRCP W WO CONTAST  Result Date: 04/02/2023 CLINICAL DATA:  Jaundice.  History of melanoma. EXAM: MRI ABDOMEN WITHOUT AND WITH CONTRAST (INCLUDING MRCP) TECHNIQUE: Multiplanar multisequence MR imaging of the abdomen was performed both before and after the administration of intravenous contrast. Heavily T2-weighted images of the biliary and pancreatic ducts were obtained, and three-dimensional MRCP images were rendered by post processing. CONTRAST:  8mL GADAVIST GADOBUTROL 1 MMOL/ML IV SOLN COMPARISON:  CT chest, abdomen and pelvis 03/30/2023 FINDINGS: Lower chest: Trace bilateral pleural effusions. Hepatobiliary: There is diffuse hepatic steatosis with relative increased fatty deposition involving the left lobe compared with the right. No focal enhancing liver lesions. There is relative hypertrophy of the lateral segment of left hepatic lobe. Capsular retraction with underlying parenchymal scarring identified within segment 7 of the liver. No discrete enhancing liver lesions identified. Stones identified within the dependent portion of the gallbladder measuring up to 0.9 cm. The gallbladder wall thickness measures 3 mm. No significant pericholecystic fluid. Common bile duct dilatation is identified with moderate increased intrahepatic bile duct dilatation. The CBD measures up to 1.5 cm. Sludge noted within the distal half of the common bile duct. There is an impacted stone at the ampulla measuring 8 mm, image 15/10 and image 34/5. Pancreas:  No mass, inflammatory changes, or other parenchymal abnormality identified. Spleen: The spleen measures 11.7 x 5.6 by 12.1 cm. (Volume = 420 cm^3) Adrenals/Urinary Tract: Normal appearance of the adrenal glands. No suspicious mass or  hydronephrosis. Small left kidney cysts are noted. The largest measures 7 mm. No specific follow-up imaging recommended. Stomach/Bowel: Stomach is normal. No dilated loops of large or small bowel. Vascular/Lymphatic: Aortic atherosclerosis. Periportal lymph node measures 1.2 cm, image 78/1102. Other: No ascites. Body wall edema is noted extending along both flanks. Musculoskeletal: No suspicious bone lesions identified. IMPRESSION: 1. Impacted stone at the ampulla measuring 8 mm with associated intra and extrahepatic bile duct dilatation. 2. Cholelithiasis. 3. Diffuse hepatic steatosis with relative increased fatty deposition involving the left lobe compared with the right. There is relative hypertrophy of the lateral segment of left hepatic lobe. Capsular retraction with underlying parenchymal scarring identified within segment 7 of the liver. Imaging findings may be seen in the setting of early cirrhosis. 4. Trace bilateral pleural effusions. 5. Body wall edema extending along both flanks. 6. Mild splenomegaly. 7. Aortic Atherosclerosis (ICD10-I70.0). These results will be called to the ordering clinician or representative by the Radiologist Assistant, and communication documented in the PACS or [employer]. Electronically Signed   By: Signa Kell M.D.   On: 04/02/2023 05:25   MR 3D Recon At Scanner  Result Date: 04/02/2023 CLINICAL DATA:  Jaundice.  History of melanoma. EXAM: MRI ABDOMEN WITHOUT AND WITH CONTRAST (INCLUDING MRCP) TECHNIQUE: Multiplanar multisequence MR imaging of the abdomen was performed both before and after the administration of intravenous contrast. Heavily T2-weighted images of the biliary and pancreatic ducts were obtained, and three-dimensional MRCP images were rendered by post processing. CONTRAST:  8mL GADAVIST GADOBUTROL 1 MMOL/ML IV SOLN COMPARISON:  CT chest, abdomen and pelvis 03/30/2023 FINDINGS: Lower chest: Trace bilateral pleural effusions. Hepatobiliary: There is  diffuse hepatic steatosis with relative increased fatty deposition involving the left lobe compared with the right. No focal enhancing liver lesions. There is relative hypertrophy of the lateral segment of left hepatic lobe. Capsular retraction with underlying parenchymal scarring identified within segment 7 of the liver. No discrete enhancing liver lesions identified. Stones identified within the dependent portion of the gallbladder measuring up to 0.9 cm. The gallbladder wall thickness measures 3 mm. No significant pericholecystic fluid. Common bile duct dilatation is identified with moderate increased intrahepatic bile duct dilatation. The CBD measures up to 1.5 cm. Sludge noted within the distal half of the common bile duct. There is an impacted stone at the ampulla measuring 8 mm, image 15/10 and image 34/5. Pancreas: No mass, inflammatory changes, or other parenchymal abnormality identified. Spleen: The spleen measures 11.7 x 5.6 by 12.1 cm. (Volume = 420 cm^3) Adrenals/Urinary Tract: Normal appearance of the adrenal glands. No suspicious mass or hydronephrosis. Small left kidney cysts are noted. The largest measures 7 mm. No specific follow-up imaging recommended. Stomach/Bowel: Stomach is normal. No dilated loops of large or small bowel. Vascular/Lymphatic: Aortic atherosclerosis. Periportal lymph node measures 1.2 cm, image 78/1102. Other: No ascites. Body wall edema is noted extending along both flanks. Musculoskeletal: No suspicious bone lesions identified. IMPRESSION: 1. Impacted stone at the ampulla measuring 8 mm with associated intra and extrahepatic bile duct dilatation. 2. Cholelithiasis. 3. Diffuse hepatic steatosis with relative increased fatty deposition involving the left lobe compared with the right. There is relative hypertrophy of the lateral segment of left hepatic lobe. Capsular retraction with underlying parenchymal scarring identified within segment 7 of the liver.  Imaging findings may be  seen in the setting of early cirrhosis. 4. Trace bilateral pleural effusions. 5. Body wall edema extending along both flanks. 6. Mild splenomegaly. 7. Aortic Atherosclerosis (ICD10-I70.0). These results will be called to the ordering clinician or representative by the Radiologist Assistant, and communication documented in the PACS or [employer]. Electronically Signed   By: Signa Kell M.D.   On: 04/02/2023 05:25   ECHOCARDIOGRAM COMPLETE  Result Date: 03/31/2023    ECHOCARDIOGRAM REPORT   Patient Name:   NYILAH DASILVA Date of Exam: 03/31/2023 Medical Rec #:  [id number]     Height:       62.2 in Accession #:    [id number]    Weight:       177.5 lb Date of Birth:  Jun 06, 1940      BSA:          1.822 m Patient Age:    82 years      BP:           104/54 mmHg Patient Gender: F             HR:           99 bpm. Exam Location:  Inpatient Procedure: 2D Echo, Cardiac Doppler, Color Doppler and Intracardiac            Opacification Agent Indications:    Dyspnea R06.00  History:        Patient has prior history of Echocardiogram examinations, most                 recent 06/04/2021. Previous Myocardial Infarction; Risk                 Factors:Hypertension.  Sonographer:    Lucendia Herrlich RCS Referring Phys: Elenore Paddy REESE IMPRESSIONS  1. Left ventricular ejection fraction, by estimation, is 60 to 65%. The left ventricle has normal function. The left ventricle has no regional wall motion abnormalities. There is mild left ventricular hypertrophy of the basal-septal segment. Left ventricular diastolic parameters are consistent with Grade I diastolic dysfunction (impaired relaxation).  2. Right ventricular systolic function is normal. The right ventricular size is normal. There is normal pulmonary artery systolic pressure. The estimated right ventricular systolic pressure is 28.6 mmHg.  3. The mitral valve is degenerative. No evidence of mitral valve regurgitation. No evidence of mitral stenosis.  4. The aortic  valve is tricuspid. Aortic valve regurgitation is trivial. Aortic valve sclerosis/calcification is present, without any evidence of aortic stenosis. Aortic valve Vmax measures 1.51 m/s.  5. The inferior vena cava is normal in size with greater than 50% respiratory variability, suggesting right atrial pressure of 3 mmHg. FINDINGS  Left Ventricle: Left ventricular ejection fraction, by estimation, is 60 to 65%. The left ventricle has normal function. The left ventricle has no regional wall motion abnormalities. Definity contrast agent was given IV to delineate the left ventricular  endocardial borders. The left ventricular internal cavity size was normal in size. There is mild left ventricular hypertrophy of the basal-septal segment. Left ventricular diastolic parameters are consistent with Grade I diastolic dysfunction (impaired relaxation). Normal left ventricular filling pressure. Right Ventricle: The right ventricular size is normal. No increase in right ventricular wall thickness. Right ventricular systolic function is normal. There is normal pulmonary artery systolic pressure. The tricuspid regurgitant velocity is 2.53 m/s, and  with an assumed right atrial pressure of 3 mmHg, the estimated right ventricular systolic pressure is 28.6 mmHg. Left Atrium: Left atrial  size was normal in size. Right Atrium: Right atrial size was normal in size. Pericardium: There is no evidence of pericardial effusion. Mitral Valve: The mitral valve is degenerative in appearance. Mild to moderate mitral annular calcification. No evidence of mitral valve regurgitation. No evidence of mitral valve stenosis. Tricuspid Valve: The tricuspid valve is normal in structure. Tricuspid valve regurgitation is trivial. No evidence of tricuspid stenosis. Aortic Valve: The aortic valve is tricuspid. Aortic valve regurgitation is trivial. Aortic valve sclerosis/calcification is present, without any evidence of aortic stenosis. Aortic valve peak  gradient measures 9.1 mmHg. Pulmonic Valve: The pulmonic valve was normal in structure. Pulmonic valve regurgitation is not visualized. No evidence of pulmonic stenosis. Aorta: The aortic root is normal in size and structure. Venous: The inferior vena cava is normal in size with greater than 50% respiratory variability, suggesting right atrial pressure of 3 mmHg. IAS/Shunts: No atrial level shunt detected by color flow Doppler.  LEFT VENTRICLE PLAX 2D LVIDd:         4.50 cm   Diastology LVIDs:         3.30 cm   LV e' lateral:   6.85 cm/s LV PW:         1.10 cm   LV E/e' lateral: 9.6 LV IVS:        1.10 cm LVOT diam:     1.90 cm LV SV:         54 LV SV Index:   30 LVOT Area:     2.84 cm  RIGHT VENTRICLE             IVC RV S prime:     15.10 cm/s  IVC diam: 1.50 cm TAPSE (M-mode): 1.7 cm LEFT ATRIUM             Index        RIGHT ATRIUM           Index LA diam:        3.30 cm 1.81 cm/m   RA Area:     10.20 cm LA Vol (A2C):   40.3 ml 22.12 ml/m  RA Volume:   15.60 ml  8.56 ml/m LA Vol (A4C):   40.7 ml 22.34 ml/m LA Biplane Vol: 42.3 ml 23.22 ml/m  AORTIC VALVE AV Area (Vmax): 2.19 cm AV Vmax:        151.00 cm/s AV Peak Grad:   9.1 mmHg LVOT Vmax:      116.67 cm/s LVOT Vmean:     78.633 cm/s LVOT VTI:       0.190 m  AORTA Ao Root diam: 3.10 cm Ao Asc diam:  3.40 cm MITRAL VALVE               TRICUSPID VALVE MV Area (PHT): 4.36 cm    TR Peak grad:   25.6 mmHg MV Decel Time: 174 msec    TR Vmax:        253.00 cm/s MR Peak grad: 22.8 mmHg MR Vmax:      239.00 cm/s  SHUNTS MV E velocity: 65.60 cm/s  Systemic VTI:  0.19 m MV A velocity: 88.60 cm/s  Systemic Diam: 1.90 cm MV E/A ratio:  0.74 Armanda Magic MD Electronically signed by Armanda Magic MD Signature Date/Time: 03/31/2023/7:35:40 PM    Final    US Abdomen Limited  Result Date: 03/30/2023 CLINICAL DATA:  Right upper quadrant pain EXAM: ULTRASOUND ABDOMEN LIMITED RIGHT UPPER QUADRANT COMPARISON:  CT today FINDINGS: Gallbladder: Numerous layering small  stones within the gallbladder. No wall thickening or sonographic Murphy sign. Common bile duct: Diameter: Dilated, measuring up to 10 mm. The distal duct is obscured by overlying bowel gas. Liver: No focal lesion identified. Within normal limits in parenchymal echogenicity. Portal vein is patent on color Doppler imaging with normal direction of blood flow towards the liver. Other: None. IMPRESSION: Cholelithiasis.  No sonographic evidence of acute cholecystitis. Common bile duct is dilated measuring 10 mm. Distal duct cannot be visualized due to overlying bowel gas. Recommend correlation with LFTs. If further evaluation is felt warranted, MRCP or ERCP may be helpful to completely exclude distal CBD stone. Electronically Signed   By: Charlett Nose M.D.   On: 03/30/2023 20:05   CT CHEST ABDOMEN PELVIS WO CONTRAST  Result Date: 03/30/2023 CLINICAL DATA:  Sepsis, history of melanoma EXAM: CT CHEST, ABDOMEN AND PELVIS WITHOUT CONTRAST TECHNIQUE: Multidetector CT imaging of the chest, abdomen and pelvis was performed following the standard protocol without IV contrast. RADIATION DOSE REDUCTION: This exam was performed according to the departmental dose-optimization program which includes automated exposure control, adjustment of the mA and/or kV according to patient size and/or use of iterative reconstruction technique. COMPARISON:  01/01/2023, 05/27/2021 FINDINGS: CT CHEST FINDINGS Cardiovascular: Unenhanced imaging of the heart is unremarkable without pericardial effusion. Calcifications are seen within the mitral and aortic valves. Normal caliber of the thoracic aorta. Atherosclerosis of the aorta and coronary vasculature. Right chest wall port via internal jugular approach tip within the superior vena cava. Mediastinum/Nodes: No enlarged mediastinal, hilar, or axillary lymph nodes. Thyroid gland, trachea, and esophagus demonstrate no significant findings. Lungs/Pleura: No acute airspace disease, effusion, or  pneumothorax. Central airways are patent. Musculoskeletal: Prior healed anterior right rib fractures. No acute or destructive bony abnormalities. Reconstructed images demonstrate no additional findings. CT ABDOMEN PELVIS FINDINGS Hepatobiliary: Cholelithiasis without evidence of acute cholecystitis. There is mild intrahepatic biliary duct dilation, with prominent dilation of the common bile duct measuring up to 14 mm, increased since prior exams. No evidence of choledocholithiasis. Pancreas: Diffuse pancreatic atrophy. No acute inflammatory changes. Spleen: Splenomegaly, measuring 14.1 x 7.1 x 10.0 cm. No focal parenchymal abnormality on this unenhanced exam. Adrenals/Urinary Tract: No urinary tract calculi or obstructive uropathy. The adrenals and bladder are unremarkable. Stomach/Bowel: No bowel obstruction or ileus. Distal colonic diverticulosis without evidence of diverticulitis. Normal appendix right lower quadrant. No bowel wall thickening or inflammatory change. Vascular/Lymphatic: Progressive metastatic adenopathy in the right lower pelvis and inguinal regions. Index lymph nodes are as follows: Right inguinal, image 110/2, 21 mm short axis. Deep right inguinal, image 94/2, 35 mm short axis. Right external iliac, image 98/2, 27 mm short axis. No other pathologic adenopathy. Diffuse atherosclerosis of the aorta and its branches. Reproductive: Uterus and bilateral adnexa are unremarkable. Other: No free fluid or free intraperitoneal gas. No abdominal wall hernia. Stable presacral fat containing mass measures 7.2 x 6.5 cm, previously characterized as a myelolipoma based on imaging characteristics and lack of metabolic activity. Musculoskeletal: No acute or destructive bony abnormalities. Reconstructed images demonstrate no additional findings. IMPRESSION: 1. Progressive metastatic adenopathy in the right external iliac and inguinal regions, consistent with worsening metastatic melanoma. 2. Progressive  intrahepatic and extrahepatic biliary duct dilation, of uncertain etiology or significance. No evidence of choledocholithiasis. 3. Cholelithiasis without cholecystitis. 4. Splenomegaly. 5. 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 6. Distal colonic diverticulosis without diverticulitis. 7. Aortic Atherosclerosis (ICD10-I70.0). Coronary artery atherosclerosis. Electronically Signed  By: Sharlet Salina M.D.   On: 03/30/2023 18:00   CT Head Wo Contrast  Result Date: 03/30/2023 CLINICAL DATA:  Syncope/presyncope. Possible stroke. Possible sepsis. EXAM: CT HEAD WITHOUT CONTRAST TECHNIQUE: Contiguous axial images were obtained from the base of the skull through the vertex without intravenous contrast. RADIATION DOSE REDUCTION: This exam was performed according to the departmental dose-optimization program which includes automated exposure control, adjustment of the mA and/or kV according to patient size and/or use of iterative reconstruction technique. COMPARISON:  05/26/2021 FINDINGS: Brain: Age related volume loss. No evidence of old or acute focal infarction, mass lesion, hemorrhage, hydrocephalus or extra-axial collection. Vascular: No abnormal vascular finding. Skull: Normal.  No fracture or focal lesion. Sinuses/Orbits: Clear/normal Other: None IMPRESSION: No acute or reversible finding. Age related volume loss. Electronically Signed   By: Paulina Fusi M.D.   On: 03/30/2023 16:07   DG Chest Port 1 View  Result Date: 03/30/2023 CLINICAL DATA:  Questionable sepsis - evaluate for abnormality. History of right lower leg melanoma. EXAM: PORTABLE CHEST 1 VIEW COMPARISON:  Chest radiograph dated May 27, 2021. FINDINGS: The heart size and mediastinal contours are within normal limits. Stable right chest Port-A-Cath with tip at the level of the distal SVC. Mild left basilar atelectasis. Both lungs are otherwise clear.  No acute osseous abnormality. IMPRESSION: No acute cardiopulmonary findings. Electronically Signed   By: Hart Robinsons M.D.   On: 03/30/2023 15:50    Microbiology: Results for orders placed or performed during the hospital encounter of 03/30/23  Blood Culture (routine x 2)     Status: Abnormal   Collection Time: 03/30/23 12:36 PM   Specimen: BLOOD  Result Value Ref Range Status   Specimen Description   Final    BLOOD port Performed at [facility], [address], [place], [place] [address]    Special Requests   Final    BOTTLES DRAWN AEROBIC AND ANAEROBIC Blood Culture adequate volume Performed at [facility], [address], [place], [place] [address]    Culture  Setup Time   Final    GRAM NEGATIVE RODS ANAEROBIC BOTTLE ONLY CRITICAL RESULT CALLED TO, READ BACK BY AND VERIFIED WITH: KIM JENKINS AT 1518 03/31/2023 BY T KENNEDY Organism ID to follow CRITICAL RESULT CALLED TO, READ BACK BY AND VERIFIED WITH: J LEDFORD,[email or web address]  04/01/23 MK Performed at [facility], [address], [place], [place] [address]    Culture ESCHERICHIA COLI (A)  Final   Report Status 04/02/2023 FINAL  Final   Organism ID, Bacteria ESCHERICHIA COLI  Final   Organism ID, Bacteria ESCHERICHIA COLI  Final      Susceptibility   Escherichia coli - KIRBY BAUER*    CEFAZOLIN SENSITIVE Sensitive    Escherichia coli - MIC*    AMPICILLIN <=2 SENSITIVE Sensitive     CEFEPIME <=0.12 SENSITIVE Sensitive     CEFTAZIDIME <=1 SENSITIVE Sensitive     CEFTRIAXONE <=0.25 SENSITIVE Sensitive     CIPROFLOXACIN <=0.25 SENSITIVE Sensitive     GENTAMICIN <=1 SENSITIVE Sensitive     IMIPENEM <=0.25 SENSITIVE Sensitive     TRIMETH/SULFA <=20 SENSITIVE Sensitive     AMPICILLIN/SULBACTAM <=2 SENSITIVE Sensitive     PIP/TAZO <=4 SENSITIVE Sensitive ug/mL    * ESCHERICHIA COLI    ESCHERICHIA COLI  Blood Culture ID Panel (Reflexed)     Status: Abnormal   Collection Time: 03/30/23 12:36 PM  Result Value Ref Range  Status   Enterococcus faecalis NOT DETECTED NOT DETECTED Final  Enterococcus Faecium NOT DETECTED NOT DETECTED Final   Listeria monocytogenes NOT DETECTED NOT DETECTED Final   Staphylococcus species NOT DETECTED NOT DETECTED Final   Staphylococcus aureus (BCID) NOT DETECTED NOT DETECTED Final   Staphylococcus epidermidis NOT DETECTED NOT DETECTED Final   Staphylococcus lugdunensis NOT DETECTED NOT DETECTED Final   Streptococcus species NOT DETECTED NOT DETECTED Final   Streptococcus agalactiae NOT DETECTED NOT DETECTED Final   Streptococcus pneumoniae NOT DETECTED NOT DETECTED Final   Streptococcus pyogenes NOT DETECTED NOT DETECTED Final   A.calcoaceticus-baumannii NOT DETECTED NOT DETECTED Final   Bacteroides fragilis NOT DETECTED NOT DETECTED Final   Enterobacterales DETECTED (A) NOT DETECTED Final    Comment: Enterobacterales represent a large order of gram negative bacteria, not a single organism. CRITICAL RESULT CALLED TO, READ BACK BY AND VERIFIED WITH: J LEDFORD,PHARMD@[email or web address]  04/01/23 MK    Enterobacter cloacae complex NOT DETECTED NOT DETECTED Final   Escherichia coli DETECTED (A) NOT DETECTED Final    Comment: CRITICAL RESULT CALLED TO, READ BACK BY AND VERIFIED WITH: J LEDFORD,[email or web address]@0028  04/01/23 MK    Klebsiella aerogenes NOT DETECTED NOT DETECTED Final   Klebsiella oxytoca NOT DETECTED NOT DETECTED Final   Klebsiella pneumoniae NOT DETECTED NOT DETECTED Final   Proteus species NOT DETECTED NOT DETECTED Final   Salmonella species NOT DETECTED NOT DETECTED Final   Serratia marcescens NOT DETECTED NOT DETECTED Final   Haemophilus influenzae NOT DETECTED NOT DETECTED Final   Neisseria meningitidis NOT DETECTED NOT DETECTED Final   Pseudomonas aeruginosa NOT DETECTED NOT DETECTED Final   Stenotrophomonas maltophilia NOT DETECTED NOT DETECTED Final   Candida albicans NOT DETECTED NOT DETECTED Final   Candida auris NOT DETECTED NOT DETECTED Final   Candida glabrata NOT  DETECTED NOT DETECTED Final   Candida krusei NOT DETECTED NOT DETECTED Final   Candida parapsilosis NOT DETECTED NOT DETECTED Final   Candida tropicalis NOT DETECTED NOT DETECTED Final   Cryptococcus neoformans/gattii NOT DETECTED NOT DETECTED Final   CTX-M ESBL NOT DETECTED NOT DETECTED Final   Carbapenem resistance IMP NOT DETECTED NOT DETECTED Final   Carbapenem resistance KPC NOT DETECTED NOT DETECTED Final   Carbapenem resistance NDM NOT DETECTED NOT DETECTED Final   Carbapenem resist OXA 48 LIKE NOT DETECTED NOT DETECTED Final   Carbapenem resistance VIM NOT DETECTED NOT DETECTED Final    Comment: Performed at [facility], [address] [place], [place] [address]  Blood Culture (routine x 2)     Status: None (Preliminary result)   Collection Time: 03/30/23 12:50 PM   Specimen: BLOOD  Result Value Ref Range Status   Specimen Description BLOOD BLOOD LEFT ARM  Final   Special Requests   Final    BOTTLES DRAWN AEROBIC AND ANAEROBIC Blood Culture adequate volume   Culture   Final    NO GROWTH 4 DAYS Performed at [facility], [address], [place], [place] [address]    Report Status PENDING  Incomplete  Resp panel by RT-PCR (RSV, Flu A&B, Covid) Anterior Nasal Swab     Status: None   Collection Time: 03/30/23  2:07 PM   Specimen: Anterior Nasal Swab  Result Value Ref Range Status   SARS Coronavirus 2 by RT PCR NEGATIVE NEGATIVE Final    Comment: (NOTE) SARS-CoV-2 target nucleic acids are NOT DETECTED.  The SARS-CoV-2 RNA is generally detectable in upper respiratory specimens during the acute phase of infection. The lowest concentration of SARS-CoV-2 viral copies this assay can detect is 138 copies/mL.  A negative result does not preclude SARS-Cov-2 infection and should not be used as the sole basis for treatment or other patient management decisions. A negative result may occur with  improper specimen collection/handling, submission of specimen other than  nasopharyngeal swab, presence of viral mutation(s) within the areas targeted by this assay, and inadequate number of viral copies(<138 copies/mL). A negative result must be combined with clinical observations, patient history, and epidemiological information. The expected result is Negative.  Fact Sheet for Patients:  BloggerCourse.com  Fact Sheet for Healthcare Providers:  SeriousBroker.it  This test is no t yet approved or cleared by the [place] FDA and  has been authorized for detection and/or diagnosis of SARS-CoV-2 by FDA under an Emergency Use Authorization (EUA). This EUA will remain  in effect (meaning this test can be used) for the duration of the COVID-19 declaration under Section 564(b)(1) of the Act, 21 U.S.C.section 360bbb-3(b)(1), unless the authorization is terminated  or revoked sooner.       Influenza A by PCR NEGATIVE NEGATIVE Final   Influenza B by PCR NEGATIVE NEGATIVE Final    Comment: (NOTE) The Xpert Xpress SARS-CoV-2/FLU/RSV plus assay is intended as an aid in the diagnosis of influenza from Nasopharyngeal swab specimens and should not be used as a sole basis for treatment. Nasal washings and aspirates are unacceptable for Xpert Xpress SARS-CoV-2/FLU/RSV testing.  Fact Sheet for Patients: BloggerCourse.com  Fact Sheet for Healthcare Providers: SeriousBroker.it  This test is not yet approved or cleared by the [place] FDA and has been authorized for detection and/or diagnosis of SARS-CoV-2 by FDA under an Emergency Use Authorization (EUA). This EUA will remain in effect (meaning this test can be used) for the duration of the COVID-19 declaration under Section 564(b)(1) of the Act, 21 U.S.C. section 360bbb-3(b)(1), unless the authorization is terminated or revoked.     Resp Syncytial Virus by PCR NEGATIVE NEGATIVE Final    Comment:  (NOTE) Fact Sheet for Patients: BloggerCourse.com  Fact Sheet for Healthcare Providers: SeriousBroker.it  This test is not yet approved or cleared by the [place] FDA and has been authorized for detection and/or diagnosis of SARS-CoV-2 by FDA under an Emergency Use Authorization (EUA). This EUA will remain in effect (meaning this test can be used) for the duration of the COVID-19 declaration under Section 564(b)(1) of the Act, 21 U.S.C. section 360bbb-3(b)(1), unless the authorization is terminated or revoked.  Performed at [facility], [address] [place], [place] [address]   MRSA Next Gen by PCR, Nasal     Status: None   Collection Time: 03/30/23  9:42 PM   Specimen: Nasal Mucosa; Nasal Swab  Result Value Ref Range Status   MRSA by PCR Next Gen NOT DETECTED NOT DETECTED Final    Comment: (NOTE) The GeneXpert MRSA Assay (FDA approved for NASAL specimens only), is one component of a comprehensive MRSA colonization surveillance program. It is not intended to diagnose MRSA infection nor to guide or monitor treatment for MRSA infections. Test performance is not FDA approved in patients less than 33 years old. Performed at [facility], [address], [place], [place] [address]   Culture, blood (Routine X 2) w Reflex to ID Panel     Status: None (Preliminary result)   Collection Time: 04/01/23 10:45 AM   Specimen: BLOOD RIGHT ARM  Result Value Ref Range Status   Specimen Description BLOOD RIGHT ARM  Final   Special Requests   Final    BOTTLES  DRAWN AEROBIC AND ANAEROBIC Blood Culture adequate volume   Culture   Final    NO GROWTH 2 DAYS Performed at [facility], [address] [place], [place] [address]    Report Status PENDING  Incomplete  Culture, blood (Routine X 2) w Reflex to ID Panel     Status: None (Preliminary result)   Collection Time: 04/01/23 10:45 AM   Specimen: BLOOD RIGHT ARM  Result  Value Ref Range Status   Specimen Description BLOOD RIGHT ARM  Final   Special Requests   Final    BOTTLES DRAWN AEROBIC AND ANAEROBIC Blood Culture adequate volume   Culture   Final    NO GROWTH 2 DAYS Performed at [facility], [address], [place], [place] [address]    Report Status PENDING  Incomplete    Labs: CBC: Recent Labs  Lab 03/30/23 1236 03/30/23 2229 03/31/23 0150 04/01/23 0700 04/02/23 0236 04/03/23 0345  WBC 21.6* 18.8* 29.0* 12.0* 6.5 5.0  NEUTROABS 16.4* 18.6*  --   --   --   --   HGB 11.4* 10.2* 11.3* 9.8* 9.0* 9.3*  HCT 36.6 32.0* 34.8* 30.3* 28.6* 30.4*  MCV 92.0 88.4 86.8 88.1 88.0 90.7  PLT 160 138* 179 135* 116* 116*   Basic Metabolic Panel: Recent Labs  Lab 03/30/23 2229 03/31/23 0150 04/01/23 0700 04/02/23 0236 04/03/23 0345  NA 130* 132* 134* 133* 133*  K 3.8 3.7 4.2 4.3 4.6  CL 95* 98 103 101 102  CO2 24 23 26 25 25   GLUCOSE 274* 96 122* 99 173*  BUN 18 19 15 15 15   CREATININE 1.36* 1.10* 0.80 0.78 0.68  CALCIUM 8.2* 8.6* 8.1* 7.8* 7.4*  MG 1.6* 1.6*  --   --   --   PHOS 3.0 2.5  --   --   --    Liver Function Tests: Recent Labs  Lab 03/30/23 1236 03/30/23 2229 04/01/23 0700 04/02/23 0236 04/03/23 0345  AST 136* 196* 67* 38 24  ALT 77* 93* 57* 41 32  ALKPHOS 282* 221* 216* 197* 187*  BILITOT 6.0* 4.5* 1.6* 1.2* 1.3*  PROT 6.2* 5.1* 4.9* 4.7* 5.0*  ALBUMIN 2.7* 2.1* 1.9* 1.9* 1.9*   CBG: Recent Labs  Lab 03/30/23 2141 03/30/23 2206 03/30/23 2237 03/30/23 2337  GLUCAP 56* 62* 174* 108*    Discharge time spent: greater than 30 minutes.  Signed: Thad Ranger, MD [facility] 04/03/2023

## 2023-04-03 NOTE — Progress Notes (Signed)
Physical Therapy Treatment Patient Details Name: Elizabeth Norman MRN: [id number] DOB: [birth year] Today's Date: 04/03/2023   History of Present Illness 82 year old woman who presented to [facility] ED 11/18 with chills, SOB, dizziness, lightheadedness. Septic shock,  immunocompromised host  E. coli bacteremia, likely GI source PMHx significant for HTN, CAD, malignant melanoma, immunotherapy myocarditis, hypothyroidism.    PT Comments  Pt received in supine and agreeable to session. Pt is able to complete all mobility tasks with up to supervision for safety. Pt requests to use the bathroom at the beginning of the session and is able to complete all hygiene tasks without assist. Pt reports slightly impaired balance, however that she is close to baseline. Pt demonstrates some unsteadiness without AD, but no overt LOB. Anticipate pt and family will be able to manage pt's mobility needs at home once medically ready for discharge.     If plan is discharge home, recommend the following: A little help with walking and/or transfers;A little help with bathing/dressing/bathroom;Assist for transportation;Help with stairs or ramp for entrance   Can travel by private vehicle        Equipment Recommendations  None recommended by PT    Recommendations for Other Services       Precautions / Restrictions Precautions Precautions: Fall Restrictions Weight Bearing Restrictions: No     Mobility  Bed Mobility Overal bed mobility: Modified Independent             General bed mobility comments: extra time no assist    Transfers Overall transfer level: Needs assistance Equipment used: None Transfers: Sit to/from Stand Sit to Stand: Supervision                Ambulation/Gait Ambulation/Gait assistance: Supervision Gait Distance (Feet): 150 Feet Assistive device: None Gait Pattern/deviations: Step-through pattern, Decreased stride length       General Gait Details: Slight unsteadiness without  AD, but no overt LOB      Balance Overall balance assessment: Mild deficits observed, not formally tested                                          Cognition Arousal: Alert Behavior During Therapy: WFL for tasks assessed/performed Overall Cognitive Status: Within Functional Limits for tasks assessed                                          Exercises      General Comments        Pertinent Vitals/Pain Pain Assessment Pain Assessment: No/denies pain     PT Goals (current goals can now be found in the care plan section) Acute Rehab PT Goals Patient Stated Goal: Get well return home PT Goal Formulation: With patient Time For Goal Achievement: 04/16/23 Progress towards PT goals: Progressing toward goals    Frequency    Min 1X/week       AM-PAC PT "6 Clicks" Mobility   Outcome Measure  Help needed turning from your back to your side while in a flat bed without using bedrails?: None Help needed moving from lying on your back to sitting on the side of a flat bed without using bedrails?: None Help needed moving to and from a bed to a chair (including a wheelchair)?: A Little Help needed standing up from a chair  using your arms (e.g., wheelchair or bedside chair)?: A Little Help needed to walk in hospital room?: A Little Help needed climbing 3-5 steps with a railing? : A Little 6 Click Score: 20    End of Session Equipment Utilized During Treatment: Gait belt Activity Tolerance: Patient tolerated treatment well Patient left: in chair;with call bell/phone within reach Nurse Communication: Mobility status PT Visit Diagnosis: Unsteadiness on feet (R26.81);Other abnormalities of gait and mobility (R26.89);Muscle weakness (generalized) (M62.81)     Time: 5621-3086 PT Time Calculation (min) (ACUTE ONLY): 15 min  Charges:    $Gait Training: 8-22 mins PT General Charges $$ ACUTE PT VISIT: 1 Visit                     Johny Shock, PTA Acute Rehabilitation Services Secure Chat Preferred  [phone]    Johny Shock 04/03/2023, 12:27 PM

## 2023-04-03 NOTE — Plan of Care (Signed)
A/Ox4 and on room air. No complaints of pain overnight. Tolerated liquid diet with no problems.   Problem: Education: Goal: Knowledge of General Education information will improve Description: Including pain rating scale, medication(s)/side effects and non-pharmacologic comfort measures Outcome: Progressing   Problem: Health Behavior/Discharge Planning: Goal: Ability to manage health-related needs will improve Outcome: Progressing   Problem: Clinical Measurements: Goal: Ability to maintain clinical measurements within normal limits will improve Outcome: Progressing Goal: Will remain free from infection Outcome: Progressing Goal: Diagnostic test results will improve Outcome: Progressing Goal: Respiratory complications will improve Outcome: Progressing Goal: Cardiovascular complication will be avoided Outcome: Progressing   Problem: Activity: Goal: Risk for activity intolerance will decrease Outcome: Progressing   Problem: Nutrition: Goal: Adequate nutrition will be maintained Outcome: Progressing   Problem: Coping: Goal: Level of anxiety will decrease Outcome: Progressing   Problem: Elimination: Goal: Will not experience complications related to bowel motility Outcome: Progressing Goal: Will not experience complications related to urinary retention Outcome: Progressing   Problem: Pain Management: Goal: General experience of comfort will improve Outcome: Progressing   Problem: Safety: Goal: Ability to remain free from injury will improve Outcome: Progressing   Problem: Skin Integrity: Goal: Risk for impaired skin integrity will decrease Outcome: Progressing

## 2023-04-03 NOTE — Patient Instructions (Signed)
Medication Instructions:  NO CHANGES *If you need a refill on your cardiac medications before your next appointment, please call your pharmacy*   Lab Work: NONE If you have labs (blood work) drawn today and your tests are completely normal, you will receive your results only by: MyChart Message (if you have MyChart) OR A paper copy in the mail If you have any lab test that is abnormal or we need to change your treatment, we will call you to review the results.   Testing/Procedures: NONE   Follow-Up: At [facility], you and your health needs are our priority.  As part of our continuing mission to provide you with exceptional heart care, we have created designated Provider Care Teams.  These Care Teams include your primary Cardiologist (physician) and Advanced Practice Providers (APPs -  Physician Assistants and Nurse Practitioners) who all work together to provide you with the care you need, when you need it.  We recommend signing up for the patient portal called "MyChart".  Sign up information is provided on this After Visit Summary.  MyChart is used to connect with patients for Virtual Visits (Telemedicine).  Patients are able to view lab/test results, encounter notes, upcoming appointments, etc.  Non-urgent messages can be sent to your provider as well.   To learn more about what you can do with MyChart, go to [email or web address].    Your next appointment:   KEEP AS PLANNED

## 2023-04-03 NOTE — Progress Notes (Signed)
Cardiology Office Note:  .   Date:  04/03/2023  ID:  Elizabeth Norman, DOB [birth year], MRN [id number] PCP: [name], MD  [facility] Providers Cardiologist:  Truett Mainland, MD PCP: [name], MD  No chief complaint on file.     History of Present Illness: .    Elizabeth Norman is a 82 y.o. 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 coli bacteremia, acute cholangitis due to bile duct obstruction due to calculus.  She also had lactic acid elevation up to 7.5, acute kidney injury, anion gap metabolic acidosis, as well as elevated BNP in the setting of septic shock.  She was treated with IV antibiotics.  CT scan showed biliary duct dilatation, as well as progressive metastatic adenopathy in the right external iliac and inguinal region consistent with worsening metastatic melanoma.  GI were consulted, she underwent MRCP that did show impacted stone at the ampulla measuring 8 mm with associated intra and extrahepatic bile duct dilatation, cholelithiasis, diffuse hepatic steatosis.  She underwent ERCP on 04/02/2023 that showed filling defects consistent with small stones and sludge on the cholangiogram, entire biliary tree was moderately dilated, choledocholithiasis was found.  Complete removal was accomplished by biliary sphincterotomy and extraction.  She was recommended cholecystectomy.  Patient preferred surgery being performed close to her home at [facility] point hospital in the coming 2 weeks.  My input was sought for perioperative cardiac risk stratification.  Patient is here today with her son.  Remarkably, she walked into her office visit today, 4 days after being in septic shock.  She is back to her baseline self.  Prior to her hospitalization, she was active, weed eating and taking care of her chicken coop without any complaints of chest pain or  shortness of breath symptoms.    Vitals:   04/03/23 1458  BP: 138/68  Pulse: 86  Resp: 17  SpO2: 97%     ROS:  Review of Systems  Cardiovascular:  Negative for chest pain, dyspnea on exertion, leg swelling, palpitations and syncope.     Studies Reviewed: Marland Kitchen        Independently interpreted Echocardiogram 03/31/2023: LVEF 60 to 65%, grade 1 diastolic function No hemodynamically significant valvular abnormality.  Cardiac MRI 05/29/2021: 1. Focal gadolinium uptake in basal inferior lateral wall with pericardial involvement Elevated T2 in this region consistent with myocarditis 2.  Overall normal EF with no defined RWMA EF 57% 3.  Posterior MV leaflet prolapse with mild appearing MR 4.  Normal AV and aortic root 3.1 cm 5.  Normal RV size and function 6.  No pericardial effusion  Labs 03/2023: BNP 1106 Trop HS 46, 36     Echocardiogram 05/29/2021:  1. Left ventricular ejection fraction, by estimation, is 55 to 60%. The  left ventricle has normal function. The left ventricle has no regional  wall motion abnormalities. Left ventricular diastolic parameters were  normal.   2. Right ventricular systolic function is normal. The right ventricular  size is normal. There is mildly elevated pulmonary artery systolic  pressure. The estimated right ventricular systolic pressure is 38.0 mmHg.   3. The mitral valve is degenerative. Mild mitral valve regurgitation. No  evidence of mitral stenosis.   4. Tricuspid valve regurgitation is moderate.   5. The aortic valve is normal in structure. Aortic valve regurgitation is  not visualized. No aortic stenosis is present.  Coronary angiography 05/28/2021: LM: Normal  LAD: Mid 30% disease         Multiple diagonal branches supplying lateral wall         Diag 3, which actually comes up quite high off the LAD, with 50% ostial stenosis Lcx: No OM branches coming off proximal part of the vessel, possibly due to multiple high diagonals          No definite evidence of an occluded proximal OM        Minimal luminal irregularities in prox LCx RCA: Dominant vessel. No significant disease   LVEDP, LVEF normal  Risk Assessment/Calculations:   RCRI 0, periopoerative risk of major cardiac events 0.4%     Physical Exam:   Physical Exam Vitals and nursing note reviewed.  Constitutional:      General: She is not in acute distress. Neck:     Vascular: No JVD.  Cardiovascular:     Rate and Rhythm: Normal rate and regular rhythm.     Heart sounds: Normal heart sounds. No murmur heard. Pulmonary:     Effort: Pulmonary effort is normal.     Breath sounds: Normal breath sounds. No wheezing or rales.  Musculoskeletal:     Right lower leg: No edema.     Left lower leg: No edema.      VISIT DIAGNOSES:   ICD-10-CM   1. Preop cardiovascular exam  Z01.810     2. 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    Elizabeth Norman is a 82 y.o. 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 BNP and troponin elevation likely due to demand ischemia and IV fluid resuscitation in setting of septic shock. She is euvolemic on exam, has no angina or heart failure symptoms at baseline, and in fact walked into the office briskly today without any complaints. Her perioperative cardiac risk is low and right of recent mild increase in troponin and BNP-in the setting of septic shock and fluid resuscitation. No further cardiac testing is necessary. Okay to proceed with upcoming cholecystectomy surgery-not scheduled but tent[facility].   Myocarditis: In 05/2021, likely related to immunotherapy for malignant melanoma. Continue management as per Dr. Ellin Saba.    Hypertension: Well controlled She is off metoprolol during her recent hospitalization for septic shock. Okay to stay off metoprolol.     I spent 20 minutes the day of her visit reviewing her records (Hospitalization 03/2023) including previous charts and labs, 10 minutes talking with her about her history and in face-to-face counseling and 5 minutes in adjusting medication, 10 minutes coordinating care and communicating recommendations to referring physicians, for a total of 45 minutes spent in patient care.     F/u in 06/2023 as previously scheduled  Signed, [name], MD

## 2023-04-03 NOTE — Progress Notes (Signed)
Brief supplement follow up.   Patient noted to have diet advancement to to Clear liquid.  Suspect pt would benefit from Boost Breeze po TID, each supplement provides 250 kcal and 9 grams of protein To help meet needs;  Intervention: Boost Breeze po TID, each supplement provides 250 kcal and 9 grams of protein Advance diet as medically applicable.   Jamelle Haring RDN, LDN Clinical Dietitian  RDN pager # available on Amion

## 2023-04-03 NOTE — Consult Note (Addendum)
Reason for Consult:cholelithasis Referring Physician: Dr Isabella Stalling is an 82 y.o. female.  HPI: 63 yof with history of metastatic melanoma on immunotherapy at [facility], myocarditis, CAD was admitted 11/18 with sepsis and e coli bacteremia.  She had ct that showed gallstones and dilated ducts.  US showed cholelithiasis (has had no evidence cholecystitis) and 10 mm duct.  LFTS were up and then she had mrcp.  This showed impacted stone at the ampulla measuring 8 mm. She underwent ercp with sphincterotomy yesterday with clearance of stones and sludge.  She is doing fine today. No complaints.  Tol diet.  I was asked to see her.   Past Medical History:  Diagnosis Date   Arthritis    Hypertension    Malignant melanoma of lower leg, right (HCC) 03/27/2021   Port-A-Cath in place 04/30/2021    Past Surgical History:  Procedure Laterality Date   CATARACT EXTRACTION W/PHACO Right 10/03/2014   Procedure: CATARACT EXTRACTION PHACO AND INTRAOCULAR LENS PLACEMENT (IOC);  Surgeon: Jethro Bolus, MD;  Location: [facility];  Service: Ophthalmology;  Laterality: Right;  CDE:10.09   CATARACT EXTRACTION W/PHACO Left 10/10/2014   Procedure: CATARACT EXTRACTION PHACO AND INTRAOCULAR LENS PLACEMENT (IOC);  Surgeon: Jethro Bolus, MD;  Location: [facility];  Service: Ophthalmology;  Laterality: Left;  CDE:8.69   CORNEAL 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   CORNEAL TRANSPLANT Left 2020   DENTAL RESTORATION/EXTRACTION WITH X-RAY     IR IMAGING GUIDED PORT INSERTION  04/05/2021   LEFT HEART CATH AND CORONARY ANGIOGRAPHY N/A 05/28/2021   Procedure: LEFT HEART CATH AND CORONARY ANGIOGRAPHY;  Surgeon: [name], MD;  Location: [facility];  Service: Cardiovascular;  Laterality: N/A;   MELANOMA EXCISION Right 02/12/2021   Procedure: WIDE LOCAL EXCISION RIGHT LOWER LEG MELANOMA , ADVANCEMENT FLAP CLOSURE DEFECT;  Surgeon: Almond Lint, MD;  Location: [facility] OR;  Service: General;  Laterality: Right;   SENTINEL NODE BIOPSY  Right 02/12/2021   Procedure: SENTINEL NODE BIOPSY;  Surgeon: Almond Lint, MD;  Location: [facility] OR;  Service: General;  Laterality: Right;    Family History  Problem Relation Age of Onset   Cancer Father    Hypertension Sister    Hypertension Sister     Social History:  reports that she has never smoked. She has never used smokeless tobacco. She reports that she does not drink alcohol and does not use drugs.  Allergies:  Allergies  Allergen Reactions   Tape     Pulls skin, please use paper tape    Medications: I have reviewed the patient's current medications.  Results for orders placed or performed during the hospital encounter of 03/30/23 (from the past 48 hour(s))  Culture, blood (Routine X 2) w Reflex to ID Panel     Status: None (Preliminary result)   Collection Time: 04/01/23 10:45 AM   Specimen: BLOOD RIGHT ARM  Result Value Ref Range   Specimen Description BLOOD RIGHT ARM    Special Requests      BOTTLES DRAWN AEROBIC AND ANAEROBIC Blood Culture adequate volume   Culture      NO GROWTH 2 DAYS Performed at [facility], [address], [place], [place] [address]    Report Status PENDING   Culture, blood (Routine X 2) w Reflex to ID Panel     Status: None (Preliminary result)   Collection Time: 04/01/23 10:45 AM   Specimen: BLOOD RIGHT ARM  Result Value Ref Range   Specimen Description BLOOD RIGHT  ARM    Special Requests      BOTTLES DRAWN AEROBIC AND ANAEROBIC Blood Culture adequate volume   Culture      NO GROWTH 2 DAYS Performed at [facility], [address], [place], [place] [address]    Report Status PENDING   Protime-INR     Status: None   Collection Time: 04/01/23 11:53 PM  Result Value Ref Range   Prothrombin Time 14.4 11.4 - 15.2 seconds   INR 1.1 0.8 - 1.2    Comment: (NOTE) INR goal varies based on device and disease states. Performed at [facility], [address] [place], [place] [address]   CBC     Status: Abnormal    Collection Time: 04/02/23  2:36 AM  Result Value Ref Range   WBC 6.5 4.0 - 10.5 K/uL   RBC 3.25 (L) 3.87 - 5.11 MIL/uL   Hemoglobin 9.0 (L) 12.0 - 15.0 g/dL   HCT 91.4 (L) 78.2 - 95.6 %   MCV 88.0 80.0 - 100.0 fL   MCH 27.7 26.0 - 34.0 pg   MCHC 31.5 30.0 - 36.0 g/dL   RDW 21.3 08.6 - 57.8 %   Platelets 116 (L) 150 - 400 K/uL   nRBC 0.0 0.0 - 0.2 %    Comment: Performed at [facility], [address] [place], [place] [address]  Comprehensive metabolic panel     Status: Abnormal   Collection Time: 04/02/23  2:36 AM  Result Value Ref Range   Sodium 133 (L) 135 - 145 mmol/L   Potassium 4.3 3.5 - 5.1 mmol/L   Chloride 101 98 - 111 mmol/L   CO2 25 22 - 32 mmol/L   Glucose, Bld 99 70 - 99 mg/dL    Comment: Glucose reference range applies only to samples taken after fasting for at least 8 hours.   BUN 15 8 - 23 mg/dL   Creatinine, Ser 9.52 0.44 - 1.00 mg/dL   Calcium 7.8 (L) 8.9 - 10.3 mg/dL   Total Protein 4.7 (L) 6.5 - 8.1 g/dL   Albumin 1.9 (L) 3.5 - 5.0 g/dL   AST 38 15 - 41 U/L   ALT 41 0 - 44 U/L   Alkaline Phosphatase 197 (H) 38 - 126 U/L   Total Bilirubin 1.2 (H) <1.2 mg/dL   GFR, Estimated >84 >13 mL/min    Comment: (NOTE) Calculated using the CKD-EPI Creatinine Equation (2021)    Anion gap 7 5 - 15    Comment: Performed at [facility], [address], [place], [place] [address]  CBC     Status: Abnormal   Collection Time: 04/03/23  3:45 AM  Result Value Ref Range   WBC 5.0 4.0 - 10.5 K/uL   RBC 3.35 (L) 3.87 - 5.11 MIL/uL   Hemoglobin 9.3 (L) 12.0 - 15.0 g/dL   HCT 02.7 (L) 25.3 - 66.4 %   MCV 90.7 80.0 - 100.0 fL   MCH 27.8 26.0 - 34.0 pg   MCHC 30.6 30.0 - 36.0 g/dL   RDW 40.3 47.4 - 25.9 %   Platelets 116 (L) 150 - 400 K/uL   nRBC 0.0 0.0 - 0.2 %    Comment: Performed at [facility], [address], [place], [place] [address]  Comprehensive metabolic panel     Status: Abnormal   Collection Time: 04/03/23  3:45 AM  Result Value Ref Range    Sodium 133 (L) 135 -  145 mmol/L   Potassium 4.6 3.5 - 5.1 mmol/L   Chloride 102 98 - 111 mmol/L   CO2 25 22 - 32 mmol/L   Glucose, Bld 173 (H) 70 - 99 mg/dL    Comment: Glucose reference range applies only to samples taken after fasting for at least 8 hours.   BUN 15 8 - 23 mg/dL   Creatinine, Ser 8.11 0.44 - 1.00 mg/dL   Calcium 7.4 (L) 8.9 - 10.3 mg/dL   Total Protein 5.0 (L) 6.5 - 8.1 g/dL   Albumin 1.9 (L) 3.5 - 5.0 g/dL   AST 24 15 - 41 U/L   ALT 32 0 - 44 U/L   Alkaline Phosphatase 187 (H) 38 - 126 U/L   Total Bilirubin 1.3 (H) <1.2 mg/dL   GFR, Estimated >91 >47 mL/min    Comment: (NOTE) Calculated using the CKD-EPI Creatinine Equation (2021)    Anion gap 6 5 - 15    Comment: Performed at [facility], [address] [place], [place] [address]    DG ERCP  Result Date: 04/02/2023 CLINICAL DATA:  Obstructing choledocholithiasis with ductal dilatation EXAM: ERCP TECHNIQUE: Multiple spot images obtained with the fluoroscopic device and submitted for interpretation post-procedure. COMPARISON:  MR 04/01/2023 FINDINGS: A series of fluoroscopic spot images document endoscopic cannulation and opacification of the CBD with balloon catheter placement. The CBD CBD is ectatic. No extravasation. Visualized intrahepatic ducts are mildly ectatic centrally. IMPRESSION: Endoscopic CBD cannulation and intervention as above. These images were submitted for radiologic interpretation only. Please see the procedural report for the amount of contrast and the fluoroscopy time utilized. Electronically Signed   By: Corlis Leak M.D.   On: 04/02/2023 19:11   DG C-Arm 1-60 Min-No Report  Result Date: 04/02/2023 Fluoroscopy was utilized by the requesting physician.  No radiographic interpretation.   MR ABDOMEN MRCP W WO CONTAST  Result Date: 04/02/2023 CLINICAL DATA:  Jaundice.  History of melanoma. EXAM: MRI ABDOMEN WITHOUT AND WITH CONTRAST (INCLUDING MRCP) TECHNIQUE: Multiplanar multisequence  MR imaging of the abdomen was performed both before and after the administration of intravenous contrast. Heavily T2-weighted images of the biliary and pancreatic ducts were obtained, and three-dimensional MRCP images were rendered by post processing. CONTRAST:  8mL GADAVIST GADOBUTROL 1 MMOL/ML IV SOLN COMPARISON:  CT chest, abdomen and pelvis 03/30/2023 FINDINGS: Lower chest: Trace bilateral pleural effusions. Hepatobiliary: There is diffuse hepatic steatosis with relative increased fatty deposition involving the left lobe compared with the right. No focal enhancing liver lesions. There is relative hypertrophy of the lateral segment of left hepatic lobe. Capsular retraction with underlying parenchymal scarring identified within segment 7 of the liver. No discrete enhancing liver lesions identified. Stones identified within the dependent portion of the gallbladder measuring up to 0.9 cm. The gallbladder wall thickness measures 3 mm. No significant pericholecystic fluid. Common bile duct dilatation is identified with moderate increased intrahepatic bile duct dilatation. The CBD measures up to 1.5 cm. Sludge noted within the distal half of the common bile duct. There is an impacted stone at the ampulla measuring 8 mm, image 15/10 and image 34/5. Pancreas: No mass, inflammatory changes, or other parenchymal abnormality identified. Spleen: The spleen measures 11.7 x 5.6 by 12.1 cm. (Volume = 420 cm^3) Adrenals/Urinary Tract: Normal appearance of the adrenal glands. No suspicious mass or hydronephrosis. Small left kidney cysts are noted. The largest measures 7 mm. No specific follow-up imaging recommended. Stomach/Bowel: Stomach is normal. No dilated loops of large or small  bowel. Vascular/Lymphatic: Aortic atherosclerosis. Periportal lymph node measures 1.2 cm, image 78/1102. Other: No ascites. Body wall edema is noted extending along both flanks. Musculoskeletal: No suspicious bone lesions identified. IMPRESSION: 1.  Impacted stone at the ampulla measuring 8 mm with associated intra and extrahepatic bile duct dilatation. 2. Cholelithiasis. 3. Diffuse hepatic steatosis with relative increased fatty deposition involving the left lobe compared with the right. There is relative hypertrophy of the lateral segment of left hepatic lobe. Capsular retraction with underlying parenchymal scarring identified within segment 7 of the liver. Imaging findings may be seen in the setting of early cirrhosis. 4. Trace bilateral pleural effusions. 5. Body wall edema extending along both flanks. 6. Mild splenomegaly. 7. Aortic Atherosclerosis (ICD10-I70.0). These results will be called to the ordering clinician or representative by the Radiologist Assistant, and communication documented in the PACS or [employer]. Electronically Signed   By: Signa Kell M.D.   On: 04/02/2023 05:25   MR 3D Recon At Scanner  Result Date: 04/02/2023 CLINICAL DATA:  Jaundice.  History of melanoma. EXAM: MRI ABDOMEN WITHOUT AND WITH CONTRAST (INCLUDING MRCP) TECHNIQUE: Multiplanar multisequence MR imaging of the abdomen was performed both before and after the administration of intravenous contrast. Heavily T2-weighted images of the biliary and pancreatic ducts were obtained, and three-dimensional MRCP images were rendered by post processing. CONTRAST:  8mL GADAVIST GADOBUTROL 1 MMOL/ML IV SOLN COMPARISON:  CT chest, abdomen and pelvis 03/30/2023 FINDINGS: Lower chest: Trace bilateral pleural effusions. Hepatobiliary: There is diffuse hepatic steatosis with relative increased fatty deposition involving the left lobe compared with the right. No focal enhancing liver lesions. There is relative hypertrophy of the lateral segment of left hepatic lobe. Capsular retraction with underlying parenchymal scarring identified within segment 7 of the liver. No discrete enhancing liver lesions identified. Stones identified within the dependent portion of the gallbladder  measuring up to 0.9 cm. The gallbladder wall thickness measures 3 mm. No significant pericholecystic fluid. Common bile duct dilatation is identified with moderate increased intrahepatic bile duct dilatation. The CBD measures up to 1.5 cm. Sludge noted within the distal half of the common bile duct. There is an impacted stone at the ampulla measuring 8 mm, image 15/10 and image 34/5. Pancreas: No mass, inflammatory changes, or other parenchymal abnormality identified. Spleen: The spleen measures 11.7 x 5.6 by 12.1 cm. (Volume = 420 cm^3) Adrenals/Urinary Tract: Normal appearance of the adrenal glands. No suspicious mass or hydronephrosis. Small left kidney cysts are noted. The largest measures 7 mm. No specific follow-up imaging recommended. Stomach/Bowel: Stomach is normal. No dilated loops of large or small bowel. Vascular/Lymphatic: Aortic atherosclerosis. Periportal lymph node measures 1.2 cm, image 78/1102. Other: No ascites. Body wall edema is noted extending along both flanks. Musculoskeletal: No suspicious bone lesions identified. IMPRESSION: 1. Impacted stone at the ampulla measuring 8 mm with associated intra and extrahepatic bile duct dilatation. 2. Cholelithiasis. 3. Diffuse hepatic steatosis with relative increased fatty deposition involving the left lobe compared with the right. There is relative hypertrophy of the lateral segment of left hepatic lobe. Capsular retraction with underlying parenchymal scarring identified within segment 7 of the liver. Imaging findings may be seen in the setting of early cirrhosis. 4. Trace bilateral pleural effusions. 5. Body wall edema extending along both flanks. 6. Mild splenomegaly. 7. Aortic Atherosclerosis (ICD10-I70.0). These results will be called to the ordering clinician or representative by the Radiologist Assistant, and communication documented in the PACS or [employer]. Electronically Signed   By: Signa Kell  M.D.   On: 04/02/2023 05:25    Review  of Systems  Constitutional:  Negative for fever.  Gastrointestinal:  Negative for abdominal pain.  All other systems reviewed and are negative.  Blood pressure (!) 118/47, pulse 67, temperature 98 F (36.7 C), resp. rate 18, height 5\' 2"  (1.575 m), weight 80.5 kg, SpO2 98%. Physical Exam Vitals reviewed.  Constitutional:      Appearance: She is well-developed.  Eyes:     General: No scleral icterus. Cardiovascular:     Rate and Rhythm: Normal rate.  Pulmonary:     Effort: Pulmonary effort is normal.  Abdominal:     General: There is no distension.     Palpations: Abdomen is soft.     Tenderness: There is no abdominal tenderness.  Musculoskeletal:     Cervical back: Neck supple.  Skin:    General: Skin is warm and dry.     Capillary Refill: Capillary refill takes less than 2 seconds.     Coloration: Skin is not jaundiced.  Neurological:     General: No focal deficit present.     Mental Status: She is alert.  Psychiatric:        Mood and Affect: Mood normal.        Behavior: Behavior normal.     Assessment/Plan: Cholelithiasis Choledocholithiasis s/p ercp -she is doing well and requests to go home -discussed standard would be to consider lap chole even while here. She does not want to do that. She would like to consider surgery and be seen at [facility]. Will send them a message. Discussed she is at risk of occurring again (although less with ercp).  She could avoid surgery if wanted to accept that higher risk.  Have asked [facility] to have cardiology see her to give risk assessment as well.  I think seeing her best plan would be for lap chole to prevent future episodes and think she would tolerate this well. If unable to do at [place] I am happy to see her back or Dr Donell Beers who did her melanoma surgery.   I reviewed all labs, GI notes and ercp, ct/mrcp/us showing gallstones. Discussed with Dr Cherlynn Perches 04/03/2023, 10:29 AM

## 2023-04-04 LAB — CULTURE, BLOOD (ROUTINE X 2)
Culture: NO GROWTH
Special Requests: ADEQUATE

## 2023-04-05 ENCOUNTER — Encounter (HOSPITAL_COMMUNITY): Payer: Self-pay | Admitting: Gastroenterology

## 2023-04-06 ENCOUNTER — Encounter: Payer: Self-pay | Admitting: Gastroenterology

## 2023-04-06 LAB — CULTURE, BLOOD (ROUTINE X 2)
Culture: NO GROWTH
Culture: NO GROWTH
Special Requests: ADEQUATE
Special Requests: ADEQUATE

## 2023-04-06 LAB — SURGICAL PATHOLOGY

## 2023-04-14 DIAGNOSIS — Z1231 Encounter for screening mammogram for malignant neoplasm of breast: Secondary | ICD-10-CM | POA: Diagnosis not present

## 2023-04-14 NOTE — Progress Notes (Signed)
[facility] [address] [place] [address]    Clinic Day:  04/16/2023  Referring physician: [name], MD  Patient Care Team: [name], MD as PCP - General (Internal Medicine) [name], MD as Medical Oncologist (Medical Oncology) [name], RN as Oncology Nurse Navigator (Oncology)   ASSESSMENT & PLAN:   Assessment: 1. Malignant melanoma of right posterior leg (pT4 pN3 M1): - She noticed fleshy lesion 4 months ago on the right leg. - Her PMD did a biopsy which was consistent with melanoma. - Wide local excision and lymph node biopsy by Dr. Donell Beers on 02/12/2021. - Pathology margins free, nodular type, Breslow's thickness 9.52 mm, Clark level V, deep margins free, ulceration absent, satellitosis absent, mitotic index 2/millimeters squared, LVI negative.  Neurotropism absent.  Tumor infiltrating lymphocytes absent.  Tumor regression not noted.  3 out of 3 positive sentinel lymph nodes for melanoma. - PET CT scan on 03/22/2021 with 3 foci of intense radiotracer activity within the right lower extremity.  In the medial right upper thigh nodule just superficial to thigh musculature measuring 11 mm with SUV 7.6.  Intramedullary hypermetabolic activity within the shaft of the mid right femur with SUV 14.6.  Intense activity associated with condylar notch of the distal right femur, appears to localize to soft tissue rather than the bone.  More diffuse activity associated with the medial right calf related to excision biopsy.  There is a focus of activity adjacent to the lateral margin of the right iliac wing SUV 4.1.  No evidence of visceral metastatic disease in the chest, abdomen or pelvis. 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 - NGS testing-BRAF negative, positive for NRAS  pathogenic variant exon 3, TERT promoter.  MSI-stable.  MMR-proficient.  NTRK 1/2/3 fusion not detected.  KIT mutation negative.  PD-L1 negative. - Nivolumab-Relatlimab every 28 days started on 05/09/2021.  Held permanently after first dose due to myocarditis. - PET scan on 07/25/2021: Slight progression with no new sites of metastatic disease. - Pembrolizumab started on 09/03/2021.  XRT to the soft tissue nodule within the medial right thigh and midshaft right femoral lesion from 03/26/2022 through 04/09/2022. -We have discussed further options including adding a low-dose ipilimumab to pembrolizumab which she is already receiving without any side effects.  I have quoted response rates around 30% (J Clin Oncol. 2021 Aug 20;39(24):2647-2655. doi: 10.1200/JCO.21.00079).  We also discussed the possibility increased chance of immunotherapy related side effects with combination of PD-1 and anti-CTL A4 antibody. - We also discussed option of binimetinib as she has NRAS mutation on previous NGS testing.  However this option is also associated with significant side effects and overall response rate of around 20%. - We also discussed other options including adding lenvatinib to pembrolizumab, least favorable option due to lack of robust data.  We have discussed chemotherapy options with dacarbazine/temozolomide among others. - Pembrolizumab with low-dose ipilimumab started on 01/14/2023 - [employer] (12/2022): NRAS Q61R (?  Binimetinib), T p53 C17 6Y, TERT promoter SNV, TMB 20.8, MSI high not detected.   2. Social/family history: - She is seen with her son today.  She lives by herself at home.  She is independent of ADLs and IADLs.  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  She is non-smoker. -  Father had colon cancer.  Sister had breast cancer and colon cancer.  Maternal aunt had breast cancer.  Paternal uncle had colon cancer.    Plan: 1. Malignant melanoma of the  right posterior leg (pT4 pN3 M1), BRAF V600 negative: - She has received 2 doses of combination pembrolizumab and low-dose ipilimumab and 1 dose of single agent pembrolizumab.  Last treatment was on 03/11/2023. - She was hospitalized from 03/30/2023 through 04/03/2023 with E. coli bacteremia and acute cholangitis. - Reviewed labs today: Normal LFTs with almond 2.8.  Creatinine is normal.  CBC grossly normal with hemoglobin 10.5. - She may proceed with her treatment today.  I will plan on repeating PET scan after next treatment. - She will need cholecystectomy per Dr. Henreitta Leber.  I will reach out to her and let her know that we can hold next treatment until after her surgery.  2.  History of immunotherapy myocarditis: - Troponin level today is 16.  Will continue to monitor prior to each treatment.  3.  Right medial thigh pain: - Small subcutaneous nodule in the right upper medial thigh measuring about 3 cm sees slightly tender and stable.  4.  Hypothyroidism: - Continue Synthroid 137 mcg daily.  TSH is 1.9.    No orders of the defined types were placed in this encounter.     [name],acting as a Neurosurgeon for [name], MD.,have documented all relevant documentation on the behalf of [name], MD,as directed by  [name], MD while in the presence of [name], MD.  I, [name] MD, have reviewed the above documentation for accuracy and completeness, and I agree with the above.      [name], MD   12/5/20245:56 PM  CHIEF COMPLAINT:   Diagnosis: metastatic malignant melanoma, BRAF V600 negative    Cancer Staging  Malignant melanoma of lower leg, right (HCC) Staging form: Melanoma of the Skin, AJCC 8th Edition - Clinical stage from 03/27/2021: Stage IV (cT4a, cN3, cM1) - Unsigned    Prior Therapy: none  Current Therapy:  Pembrolizumab (200) q21d Keytruda    HISTORY OF PRESENT ILLNESS:   Oncology History   Malignant melanoma of lower leg, right (HCC)  03/27/2021 Initial Diagnosis   Malignant melanoma of lower leg, right (HCC)   05/09/2021 - 05/09/2021 Chemotherapy   Patient is on Treatment Plan : MELANOMA - Opdualag (nivolumab/relatlimab) q28d     09/03/2021 - 01/07/2022 Chemotherapy   Patient is on Treatment Plan : MELANOMA Pembrolizumab (200) q21d     09/03/2021 -  Chemotherapy   Patient is on Treatment Plan : MELANOMA Pembrolizumab (200) + Yervoy (1mg /kg) q21d        INTERVAL HISTORY:   Elizabeth Norman is a 82 y.o. female presenting to clinic today for follow up of metastatic malignant melanoma, BRAF V600 negative. She was last seen by me on 03/11/23.  Since her last visit, she was admitted to the hospital on 03/30/23 for septic shock, E. Coli bacteremia, choledocholithiasis, cholangitis and was given empiric vancomycin, cefepime, and Flagyl. CT C/A/P showed intrahepatic and extrahepatic biliary duct dilation and progressive metastatic adenopathy in the right external iliac and inguinal region consistent with worsening metastatic melanoma. Ceftriaxone was given when blood cultures were positive for E. Coli. She then had an ERCP on 11/21 which showed filling defects consistent with small stones and sludge on the cholangiogram, entire biliary tree was moderately dilated, choledocholithiasis found, complete removal was accomplished by biliary sphincterotomy and extraction. Her condition improved and she  was discharged on 11/22 with Keflex 1000 mg BID for 2 days. She will have a cholecystectomy as an outpatient.  Today, she states that she is doing well overall. Her appetite level is at 70%. Her energy level is at 70%. She is accompanied by her son. She notes stable tenderness and soreness on right medial thigh. She denies any pain in the abdomen or new pains.   PAST MEDICAL HISTORY:   Past Medical History: Past Medical History:  Diagnosis Date   Arthritis    Hypertension    Malignant melanoma of  lower leg, right (HCC) 03/27/2021   Port-A-Cath in place 04/30/2021    Surgical History: Past Surgical History:  Procedure Laterality Date   BIOPSY  04/02/2023   Procedure: BIOPSY;  Surgeon: Meryl Dare, MD;  Location: [facility] ENDOSCOPY;  Service: Gastroenterology;;   CATARACT EXTRACTION W/PHACO Right 10/03/2014   Procedure: CATARACT EXTRACTION PHACO AND INTRAOCULAR LENS PLACEMENT (IOC);  Surgeon: Jethro Bolus, MD;  Location: [facility] ORS;  Service: Ophthalmology;  Laterality: Right;  CDE:10.09   CATARACT EXTRACTION W/PHACO Left 10/10/2014   Procedure: CATARACT EXTRACTION PHACO AND INTRAOCULAR LENS PLACEMENT (IOC);  Surgeon: Jethro Bolus, MD;  Location: [facility] ORS;  Service: Ophthalmology;  Laterality: Left;  CDE:8.69   CORNEAL 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   CORNEAL TRANSPLANT Left 2020   DENTAL RESTORATION/EXTRACTION WITH X-RAY     ERCP N/A 04/02/2023   Procedure: ENDOSCOPIC RETROGRADE CHOLANGIOPANCREATOGRAPHY (ERCP);  Surgeon: Meryl Dare, MD;  Location: [facility] ENDOSCOPY;  Service: Gastroenterology;  Laterality: N/A;   IR IMAGING GUIDED PORT INSERTION  04/05/2021   LEFT HEART CATH AND CORONARY ANGIOGRAPHY N/A 05/28/2021   Procedure: LEFT HEART CATH AND CORONARY ANGIOGRAPHY;  Surgeon: [name], MD;  Location: [facility] INVASIVE CV LAB;  Service: Cardiovascular;  Laterality: N/A;   MELANOMA EXCISION Right 02/12/2021   Procedure: WIDE LOCAL EXCISION RIGHT LOWER LEG MELANOMA , ADVANCEMENT FLAP CLOSURE DEFECT;  Surgeon: Almond Lint, MD;  Location: [facility] OR;  Service: General;  Laterality: Right;   REMOVAL OF STONES  04/02/2023   Procedure: REMOVAL OF STONES;  Surgeon: Meryl Dare, MD;  Location: [facility] ENDOSCOPY;  Service: Gastroenterology;;   Susa Day  04/02/2023   Procedure: Susa Day;  Surgeon: Meryl Dare, MD;  Location: [facility] ENDOSCOPY;  Service: Gastroenterology;;   SENTINEL NODE BIOPSY Right 02/12/2021   Procedure: SENTINEL NODE BIOPSY;  Surgeon: Almond Lint, MD;  Location: [facility] OR;   Service: General;  Laterality: Right;   SPHINCTEROTOMY  04/02/2023   Procedure: SPHINCTEROTOMY;  Surgeon: Meryl Dare, MD;  Location: [facility] ENDOSCOPY;  Service: Gastroenterology;;    Social History: Social History   Socioeconomic History   Marital status: Widowed    Spouse name: Not on file   Number of children: 3   Years of education: Not on file   Highest education level: Not on file  Occupational History   Not on file  Tobacco Use   Smoking status: Never   Smokeless tobacco: Never  Vaping Use   Vaping status: Never Used  Substance and Sexual Activity   Alcohol use: No   Drug use: No   Sexual activity: Not on file  Other Topics Concern   Not on file  Social History Narrative   Not on file   Social Determinants of Health   Financial Resource Strain: Not on file  Food Insecurity: No Food Insecurity (04/01/2023)   Hunger Vital Sign    Worried About Running Out of Food in the Last Year: Never true  Ran Out of Food in the Last Year: Never true  Transportation Needs: No Transportation Needs (04/01/2023)   PRAPARE - Administrator, Civil Service (Medical): No    Lack of Transportation (Non-Medical): No  Physical Activity: Not on file  Stress: Not on file  Social Connections: Not on file  Intimate Partner Violence: Not At Risk (04/01/2023)   Humiliation, Afraid, Rape, and Kick questionnaire    Fear of Current or Ex-Partner: No    Emotionally Abused: No    Physically Abused: No    Sexually Abused: No    Family History: Family History  Problem Relation Age of Onset   Cancer Father    Hypertension Sister    Hypertension Sister     Current Medications:  Current Outpatient Medications:    ALPRAZolam (XANAX) 0.5 MG tablet, Take 0.5 mg by mouth 3 (three) times daily., Disp: , Rfl: 2   calcium carbonate (OS-CAL - DOSED IN MG OF ELEMENTAL CALCIUM) 1250 (500 CA) MG tablet, Take 1 tablet by mouth 3 (three) times a week., Disp: , Rfl:    cholecalciferol  (VITAMIN D) 1000 UNITS tablet, Take 1,000 Units by mouth 3 (three) times a week., Disp: , Rfl:    Cyanocobalamin (VITAMIN B-12 PO), Take 1 tablet by mouth once a week., Disp: , Rfl:    famotidine (PEPCID) 40 MG tablet, Take 40 mg by mouth daily., Disp: , Rfl:    levothyroxine (SYNTHROID) 125 MCG tablet, Take 1 tablet (125 mcg total) by mouth daily before breakfast. (Patient taking differently: Take 100 mcg by mouth daily before breakfast.), Disp: 30 tablet, Rfl: 3   omeprazole (PRILOSEC) 40 MG capsule, Take 40 mg by mouth daily., Disp: , Rfl:    ondansetron (ZOFRAN) 4 MG tablet, Take 1 tablet (4 mg total) by mouth every 8 (eight) hours as needed for nausea or vomiting., Disp: 30 tablet, Rfl: 0   metoprolol tartrate (LOPRESSOR) 25 MG tablet, Take 1 tablet (25 mg total) by mouth 2 (two) times daily. Hold until follow-up with your doctor/cardiologist, Disp: , Rfl:  No current facility-administered medications for this visit.  Facility-Administered Medications Ordered in Other Visits:    sodium chloride flush (NS) 0.9 % injection 10 mL, 10 mL, Intracatheter, PRN, [name], MD, 10 mL at 04/16/23 1413   Allergies: Allergies  Allergen Reactions   Tape     Pulls skin, please use paper tape    REVIEW OF SYSTEMS:   Review of Systems  Constitutional:  Negative for chills, fatigue and fever.  HENT:   Negative for lump/mass, mouth sores, nosebleeds, sore throat and trouble swallowing.   Eyes:  Negative for eye problems.  Respiratory:  Negative for cough and shortness of breath.   Cardiovascular:  Negative for chest pain, leg swelling and palpitations.  Gastrointestinal:  Positive for diarrhea. Negative for abdominal pain, constipation, nausea and vomiting.  Genitourinary:  Negative for bladder incontinence, difficulty urinating, dysuria, frequency, hematuria and nocturia.   Musculoskeletal:  Negative for arthralgias, back pain, flank pain, myalgias and neck pain.  Skin:  Negative for  itching and rash.  Neurological:  Positive for headaches (occasional). Negative for dizziness and numbness.  Hematological:  Does not bruise/bleed easily.  Psychiatric/Behavioral:  Negative for depression, sleep disturbance and suicidal ideas. The patient is nervous/anxious.   All other systems reviewed and are negative.    VITALS:   Weight 163 lb 12.8 oz (74.3 kg).  Wt Readings from Last 3 Encounters:  04/16/23 163 lb 12.8 oz (  74.3 kg)  04/03/23 177 lb 7.5 oz (80.5 kg)  04/03/23 178 lb (80.7 kg)    Body mass index is 29.96 kg/m.  Performance status (ECOG): 1 - Symptomatic but completely ambulatory  PHYSICAL EXAM:   Physical Exam Vitals and nursing note reviewed. Exam conducted with a chaperone present.  Constitutional:      Appearance: Normal appearance.  Cardiovascular:     Rate and Rhythm: Normal rate and regular rhythm.     Pulses: Normal pulses.     Heart sounds: Normal heart sounds.  Pulmonary:     Effort: Pulmonary effort is normal.     Breath sounds: Normal breath sounds.  Abdominal:     Palpations: Abdomen is soft. There is no hepatomegaly, splenomegaly or mass.     Tenderness: There is no abdominal tenderness.  Musculoskeletal:     Right lower leg: No edema.     Left lower leg: No edema.     Comments: +3 cm tender indurated area in the right medial upper thigh  Lymphadenopathy:     Cervical: No cervical adenopathy.     Right cervical: No superficial, deep or posterior cervical adenopathy.    Left cervical: No superficial, deep or posterior cervical adenopathy.     Upper Body:     Right upper body: No supraclavicular or axillary adenopathy.     Left upper body: No supraclavicular or axillary adenopathy.  Neurological:     General: No focal deficit present.     Mental Status: She is alert and oriented to person, place, and time.  Psychiatric:        Mood and Affect: Mood normal.        Behavior: Behavior normal.     LABS:      Latest Ref Rng & Units  04/16/2023   10:25 AM 04/03/2023    3:45 AM 04/02/2023    2:36 AM  CBC  WBC 4.0 - 10.5 K/uL 5.0  5.0  6.5   Hemoglobin 12.0 - 15.0 g/dL 62.1  9.3  9.0   Hematocrit 36.0 - 46.0 % 35.0  30.4  28.6   Platelets 150 - 400 K/uL 196  116  116       Latest Ref Rng & Units 04/16/2023   10:25 AM 04/03/2023    3:45 AM 04/02/2023    2:36 AM  CMP  Glucose 70 - 99 mg/dL 308  657  99   BUN 8 - 23 mg/dL 9  15  15    Creatinine 0.44 - 1.00 mg/dL 8.46  9.62  9.52   Sodium 135 - 145 mmol/L 136  133  133   Potassium 3.5 - 5.1 mmol/L 3.4  4.6  4.3   Chloride 98 - 111 mmol/L 101  102  101   CO2 22 - 32 mmol/L 27  25  25    Calcium 8.9 - 10.3 mg/dL 8.4  7.4  7.8   Total Protein 6.5 - 8.1 g/dL 6.4  5.0  4.7   Total Bilirubin <1.2 mg/dL 1.0  1.3  1.2   Alkaline Phos 38 - 126 U/L 105  187  197   AST 15 - 41 U/L 21  24  38   ALT 0 - 44 U/L 12  32  41      No results found for: "CEA1", "CEA" / No results found for: "CEA1", "CEA" No results found for: "PSA1" No results found for: "WUX324" No results found for: "CAN125"  No results found for: "TOTALPROTELP", "ALBUMINELP", "  A1GS", "A2GS", "BETS", "BETA2SER", "GAMS", "MSPIKE", "SPEI" No results found for: "TIBC", "FERRITIN", "IRONPCTSAT" Lab Results  Component Value Date   LDH 348 (H) 03/30/2023   LDH 137 06/05/2022   LDH 144 05/13/2022     STUDIES:   DG ERCP  Result Date: 04/20/23 CLINICAL DATA:  Obstructing choledocholithiasis with ductal dilatation EXAM: ERCP TECHNIQUE: Multiple spot images obtained with the fluoroscopic device and submitted for interpretation post-procedure. COMPARISON:  MR 04/01/2023 FINDINGS: A series of fluoroscopic spot images document endoscopic cannulation and opacification of the CBD with balloon catheter placement. The CBD CBD is ectatic. No extravasation. Visualized intrahepatic ducts are mildly ectatic centrally. IMPRESSION: Endoscopic CBD cannulation and intervention as above. These images were submitted for  radiologic interpretation only. Please see the procedural report for the amount of contrast and the fluoroscopy time utilized. Electronically Signed   By: Corlis Leak M.D.   On: 20-Apr-2023 19:11   DG C-Arm 1-60 Min-No Report  Result Date: 2023-04-20 Fluoroscopy was utilized by the requesting physician.  No radiographic interpretation.   MR ABDOMEN MRCP W WO CONTAST  Result Date: Apr 20, 2023 CLINICAL DATA:  Jaundice.  History of melanoma. EXAM: MRI ABDOMEN WITHOUT AND WITH CONTRAST (INCLUDING MRCP) TECHNIQUE: Multiplanar multisequence MR imaging of the abdomen was performed both before and after the administration of intravenous contrast. Heavily T2-weighted images of the biliary and pancreatic ducts were obtained, and three-dimensional MRCP images were rendered by post processing. CONTRAST:  8mL GADAVIST GADOBUTROL 1 MMOL/ML IV SOLN COMPARISON:  CT chest, abdomen and pelvis 03/30/2023 FINDINGS: Lower chest: Trace bilateral pleural effusions. Hepatobiliary: There is diffuse hepatic steatosis with relative increased fatty deposition involving the left lobe compared with the right. No focal enhancing liver lesions. There is relative hypertrophy of the lateral segment of left hepatic lobe. Capsular retraction with underlying parenchymal scarring identified within segment 7 of the liver. No discrete enhancing liver lesions identified. Stones identified within the dependent portion of the gallbladder measuring up to 0.9 cm. The gallbladder wall thickness measures 3 mm. No significant pericholecystic fluid. Common bile duct dilatation is identified with moderate increased intrahepatic bile duct dilatation. The CBD measures up to 1.5 cm. Sludge noted within the distal half of the common bile duct. There is an impacted stone at the ampulla measuring 8 mm, image 15/10 and image 34/5. Pancreas: No mass, inflammatory changes, or other parenchymal abnormality identified. Spleen: The spleen measures 11.7 x 5.6 by 12.1 cm.  (Volume = 420 cm^3) Adrenals/Urinary Tract: Normal appearance of the adrenal glands. No suspicious mass or hydronephrosis. Small left kidney cysts are noted. The largest measures 7 mm. No specific follow-up imaging recommended. Stomach/Bowel: Stomach is normal. No dilated loops of large or small bowel. Vascular/Lymphatic: Aortic atherosclerosis. Periportal lymph node measures 1.2 cm, image 78/1102. Other: No ascites. Body wall edema is noted extending along both flanks. Musculoskeletal: No suspicious bone lesions identified. IMPRESSION: 1. Impacted stone at the ampulla measuring 8 mm with associated intra and extrahepatic bile duct dilatation. 2. Cholelithiasis. 3. Diffuse hepatic steatosis with relative increased fatty deposition involving the left lobe compared with the right. There is relative hypertrophy of the lateral segment of left hepatic lobe. Capsular retraction with underlying parenchymal scarring identified within segment 7 of the liver. Imaging findings may be seen in the setting of early cirrhosis. 4. Trace bilateral pleural effusions. 5. Body wall edema extending along both flanks. 6. Mild splenomegaly. 7. Aortic Atherosclerosis (ICD10-I70.0). These results will be called to the ordering clinician or representative by the Radiologist Assistant,  and communication documented in the PACS or [employer]. Electronically Signed   By: Signa Kell M.D.   On: 04/02/2023 05:25   MR 3D Recon At Scanner  Result Date: 04/02/2023 CLINICAL DATA:  Jaundice.  History of melanoma. EXAM: MRI ABDOMEN WITHOUT AND WITH CONTRAST (INCLUDING MRCP) TECHNIQUE: Multiplanar multisequence MR imaging of the abdomen was performed both before and after the administration of intravenous contrast. Heavily T2-weighted images of the biliary and pancreatic ducts were obtained, and three-dimensional MRCP images were rendered by post processing. CONTRAST:  8mL GADAVIST GADOBUTROL 1 MMOL/ML IV SOLN COMPARISON:  CT chest, abdomen  and pelvis 03/30/2023 FINDINGS: Lower chest: Trace bilateral pleural effusions. Hepatobiliary: There is diffuse hepatic steatosis with relative increased fatty deposition involving the left lobe compared with the right. No focal enhancing liver lesions. There is relative hypertrophy of the lateral segment of left hepatic lobe. Capsular retraction with underlying parenchymal scarring identified within segment 7 of the liver. No discrete enhancing liver lesions identified. Stones identified within the dependent portion of the gallbladder measuring up to 0.9 cm. The gallbladder wall thickness measures 3 mm. No significant pericholecystic fluid. Common bile duct dilatation is identified with moderate increased intrahepatic bile duct dilatation. The CBD measures up to 1.5 cm. Sludge noted within the distal half of the common bile duct. There is an impacted stone at the ampulla measuring 8 mm, image 15/10 and image 34/5. Pancreas: No mass, inflammatory changes, or other parenchymal abnormality identified. Spleen: The spleen measures 11.7 x 5.6 by 12.1 cm. (Volume = 420 cm^3) Adrenals/Urinary Tract: Normal appearance of the adrenal glands. No suspicious mass or hydronephrosis. Small left kidney cysts are noted. The largest measures 7 mm. No specific follow-up imaging recommended. Stomach/Bowel: Stomach is normal. No dilated loops of large or small bowel. Vascular/Lymphatic: Aortic atherosclerosis. Periportal lymph node measures 1.2 cm, image 78/1102. Other: No ascites. Body wall edema is noted extending along both flanks. Musculoskeletal: No suspicious bone lesions identified. IMPRESSION: 1. Impacted stone at the ampulla measuring 8 mm with associated intra and extrahepatic bile duct dilatation. 2. Cholelithiasis. 3. Diffuse hepatic steatosis with relative increased fatty deposition involving the left lobe compared with the right. There is relative hypertrophy of the lateral segment of left hepatic lobe. Capsular  retraction with underlying parenchymal scarring identified within segment 7 of the liver. Imaging findings may be seen in the setting of early cirrhosis. 4. Trace bilateral pleural effusions. 5. Body wall edema extending along both flanks. 6. Mild splenomegaly. 7. Aortic Atherosclerosis (ICD10-I70.0). These results will be called to the ordering clinician or representative by the Radiologist Assistant, and communication documented in the PACS or [employer]. Electronically Signed   By: Signa Kell M.D.   On: 04/02/2023 05:25   ECHOCARDIOGRAM COMPLETE  Result Date: 03/31/2023    ECHOCARDIOGRAM REPORT   Patient Name:   LAMEIKA QASEM Date of Exam: 03/31/2023 Medical Rec #:  [id number]     Height:       62.2 in Accession #:    [id number]    Weight:       177.5 lb Date of Birth:  [birth year]      BSA:          1.822 m Patient Age:    82 years      BP:           104/54 mmHg Patient Gender: F             HR:  99 bpm. Exam Location:  Inpatient Procedure: 2D Echo, Cardiac Doppler, Color Doppler and Intracardiac            Opacification Agent Indications:    Dyspnea R06.00  History:        Patient has prior history of Echocardiogram examinations, most                 recent 06/04/2021. Previous Myocardial Infarction; Risk                 Factors:Hypertension.  Sonographer:    Lucendia Herrlich RCS Referring Phys: Elenore Paddy REESE IMPRESSIONS  1. Left ventricular ejection fraction, by estimation, is 60 to 65%. The left ventricle has normal function. The left ventricle has no regional wall motion abnormalities. There is mild left ventricular hypertrophy of the basal-septal segment. Left ventricular diastolic parameters are consistent with Grade I diastolic dysfunction (impaired relaxation).  2. Right ventricular systolic function is normal. The right ventricular size is normal. There is normal pulmonary artery systolic pressure. The estimated right ventricular systolic pressure is 28.6 mmHg.  3. The mitral  valve is degenerative. No evidence of mitral valve regurgitation. No evidence of mitral stenosis.  4. The aortic valve is tricuspid. Aortic valve regurgitation is trivial. Aortic valve sclerosis/calcification is present, without any evidence of aortic stenosis. Aortic valve Vmax measures 1.51 m/s.  5. The inferior vena cava is normal in size with greater than 50% respiratory variability, suggesting right atrial pressure of 3 mmHg. FINDINGS  Left Ventricle: Left ventricular ejection fraction, by estimation, is 60 to 65%. The left ventricle has normal function. The left ventricle has no regional wall motion abnormalities. Definity contrast agent was given IV to delineate the left ventricular  endocardial borders. The left ventricular internal cavity size was normal in size. There is mild left ventricular hypertrophy of the basal-septal segment. Left ventricular diastolic parameters are consistent with Grade I diastolic dysfunction (impaired relaxation). Normal left ventricular filling pressure. Right Ventricle: The right ventricular size is normal. No increase in right ventricular wall thickness. Right ventricular systolic function is normal. There is normal pulmonary artery systolic pressure. The tricuspid regurgitant velocity is 2.53 m/s, and  with an assumed right atrial pressure of 3 mmHg, the estimated right ventricular systolic pressure is 28.6 mmHg. Left Atrium: Left atrial size was normal in size. Right Atrium: Right atrial size was normal in size. Pericardium: There is no evidence of pericardial effusion. Mitral Valve: The mitral valve is degenerative in appearance. Mild to moderate mitral annular calcification. No evidence of mitral valve regurgitation. No evidence of mitral valve stenosis. Tricuspid Valve: The tricuspid valve is normal in structure. Tricuspid valve regurgitation is trivial. No evidence of tricuspid stenosis. Aortic Valve: The aortic valve is tricuspid. Aortic valve regurgitation is trivial.  Aortic valve sclerosis/calcification is present, without any evidence of aortic stenosis. Aortic valve peak gradient measures 9.1 mmHg. Pulmonic Valve: The pulmonic valve was normal in structure. Pulmonic valve regurgitation is not visualized. No evidence of pulmonic stenosis. Aorta: The aortic root is normal in size and structure. Venous: The inferior vena cava is normal in size with greater than 50% respiratory variability, suggesting right atrial pressure of 3 mmHg. IAS/Shunts: No atrial level shunt detected by color flow Doppler.  LEFT VENTRICLE PLAX 2D LVIDd:         4.50 cm   Diastology LVIDs:         3.30 cm   LV e' lateral:   6.85 cm/s LV PW:  1.10 cm   LV E/e' lateral: 9.6 LV IVS:        1.10 cm LVOT diam:     1.90 cm LV SV:         54 LV SV Index:   30 LVOT Area:     2.84 cm  RIGHT VENTRICLE             IVC RV S prime:     15.10 cm/s  IVC diam: 1.50 cm TAPSE (M-mode): 1.7 cm LEFT ATRIUM             Index        RIGHT ATRIUM           Index LA diam:        3.30 cm 1.81 cm/m   RA Area:     10.20 cm LA Vol (A2C):   40.3 ml 22.12 ml/m  RA Volume:   15.60 ml  8.56 ml/m LA Vol (A4C):   40.7 ml 22.34 ml/m LA Biplane Vol: 42.3 ml 23.22 ml/m  AORTIC VALVE AV Area (Vmax): 2.19 cm AV Vmax:        151.00 cm/s AV Peak Grad:   9.1 mmHg LVOT Vmax:      116.67 cm/s LVOT Vmean:     78.633 cm/s LVOT VTI:       0.190 m  AORTA Ao Root diam: 3.10 cm Ao Asc diam:  3.40 cm MITRAL VALVE               TRICUSPID VALVE MV Area (PHT): 4.36 cm    TR Peak grad:   25.6 mmHg MV Decel Time: 174 msec    TR Vmax:        253.00 cm/s MR Peak grad: 22.8 mmHg MR Vmax:      239.00 cm/s  SHUNTS MV E velocity: 65.60 cm/s  Systemic VTI:  0.19 m MV A velocity: 88.60 cm/s  Systemic Diam: 1.90 cm MV E/A ratio:  0.74 Armanda Magic MD Electronically signed by Armanda Magic MD Signature Date/Time: 03/31/2023/7:35:40 PM    Final    US Abdomen Limited  Result Date: 03/30/2023 CLINICAL DATA:  Right upper quadrant pain EXAM: ULTRASOUND  ABDOMEN LIMITED RIGHT UPPER QUADRANT COMPARISON:  CT today FINDINGS: Gallbladder: Numerous layering small stones within the gallbladder. No wall thickening or sonographic Murphy sign. Common bile duct: Diameter: Dilated, measuring up to 10 mm. The distal duct is obscured by overlying bowel gas. Liver: No focal lesion identified. Within normal limits in parenchymal echogenicity. Portal vein is patent on color Doppler imaging with normal direction of blood flow towards the liver. Other: None. IMPRESSION: Cholelithiasis.  No sonographic evidence of acute cholecystitis. Common bile duct is dilated measuring 10 mm. Distal duct cannot be visualized due to overlying bowel gas. Recommend correlation with LFTs. If further evaluation is felt warranted, MRCP or ERCP may be helpful to completely exclude distal CBD stone. Electronically Signed   By: Charlett Nose M.D.   On: 03/30/2023 20:05   CT CHEST ABDOMEN PELVIS WO CONTRAST  Result Date: 03/30/2023 CLINICAL DATA:  Sepsis, history of melanoma EXAM: CT CHEST, ABDOMEN AND PELVIS WITHOUT CONTRAST TECHNIQUE: Multidetector CT imaging of the chest, abdomen and pelvis was performed following the standard protocol without IV contrast. RADIATION DOSE REDUCTION: This exam was performed according to the departmental dose-optimization program which includes automated exposure control, adjustment of the mA and/or kV according to patient size and/or use of iterative reconstruction technique. COMPARISON:  01/01/2023, 05/27/2021 FINDINGS: CT CHEST  FINDINGS Cardiovascular: Unenhanced imaging of the heart is unremarkable without pericardial effusion. Calcifications are seen within the mitral and aortic valves. Normal caliber of the thoracic aorta. Atherosclerosis of the aorta and coronary vasculature. Right chest wall port via internal jugular approach tip within the superior vena cava. Mediastinum/Nodes: No enlarged mediastinal, hilar, or axillary lymph nodes. Thyroid gland, trachea, and  esophagus demonstrate no significant findings. Lungs/Pleura: No acute airspace disease, effusion, or pneumothorax. Central airways are patent. Musculoskeletal: Prior healed anterior right rib fractures. No acute or destructive bony abnormalities. Reconstructed images demonstrate no additional findings. CT ABDOMEN PELVIS FINDINGS Hepatobiliary: Cholelithiasis without evidence of acute cholecystitis. There is mild intrahepatic biliary duct dilation, with prominent dilation of the common bile duct measuring up to 14 mm, increased since prior exams. No evidence of choledocholithiasis. Pancreas: Diffuse pancreatic atrophy. No acute inflammatory changes. Spleen: Splenomegaly, measuring 14.1 x 7.1 x 10.0 cm. No focal parenchymal abnormality on this unenhanced exam. Adrenals/Urinary Tract: No urinary tract calculi or obstructive uropathy. The adrenals and bladder are unremarkable. Stomach/Bowel: No bowel obstruction or ileus. Distal colonic diverticulosis without evidence of diverticulitis. Normal appendix right lower quadrant. No bowel wall thickening or inflammatory change. Vascular/Lymphatic: Progressive metastatic adenopathy in the right lower pelvis and inguinal regions. Index lymph nodes are as follows: Right inguinal, image 110/2, 21 mm short axis. Deep right inguinal, image 94/2, 35 mm short axis. Right external iliac, image 98/2, 27 mm short axis. No other pathologic adenopathy. Diffuse atherosclerosis of the aorta and its branches. Reproductive: Uterus and bilateral adnexa are unremarkable. Other: No free fluid or free intraperitoneal gas. No abdominal wall hernia. Stable presacral fat containing mass measures 7.2 x 6.5 cm, previously characterized as a myelolipoma based on imaging characteristics and lack of metabolic activity. Musculoskeletal: No acute or destructive bony abnormalities. Reconstructed images demonstrate no additional findings. IMPRESSION: 1. Progressive metastatic adenopathy in the right external  iliac and inguinal regions, consistent with worsening metastatic melanoma. 2. Progressive intrahepatic and extrahepatic biliary duct dilation, of uncertain etiology or significance. No evidence of choledocholithiasis. 3. Cholelithiasis without cholecystitis. 4. Splenomegaly. 5. 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 6. Distal colonic diverticulosis without diverticulitis. 7. Aortic Atherosclerosis (ICD10-I70.0). Coronary artery atherosclerosis. Electronically Signed   By: Sharlet Salina M.D.   On: 03/30/2023 18:00   CT Head Wo Contrast  Result Date: 03/30/2023 CLINICAL DATA:  Syncope/presyncope. Possible stroke. Possible sepsis. EXAM: CT HEAD WITHOUT CONTRAST TECHNIQUE: Contiguous axial images were obtained from the base of the skull through the vertex without intravenous contrast. RADIATION DOSE REDUCTION: This exam was performed according to the departmental dose-optimization program which includes automated exposure control, adjustment of the mA and/or kV according to patient size and/or use of iterative reconstruction technique. COMPARISON:  05/26/2021 FINDINGS: Brain: Age related volume loss. No evidence of old or acute focal infarction, mass lesion, hemorrhage, hydrocephalus or extra-axial collection. Vascular: No abnormal vascular finding. Skull: Normal.  No fracture or focal lesion. Sinuses/Orbits: Clear/normal Other: None IMPRESSION: No acute or reversible finding. Age related volume loss. Electronically Signed   By: Paulina Fusi M.D.   On: 03/30/2023 16:07   DG Chest Port 1 View  Result Date: 03/30/2023 CLINICAL DATA:  Questionable sepsis - evaluate for abnormality. History of right lower leg melanoma. EXAM: PORTABLE CHEST 1 VIEW COMPARISON:  Chest radiograph dated May 27, 2021. FINDINGS: The heart size and mediastinal contours are within normal limits. Stable right chest Port-A-Cath with tip at  the level of the distal SVC. Mild left basilar atelectasis. Both lungs are otherwise clear. No acute osseous abnormality. IMPRESSION: No acute cardiopulmonary findings. Electronically Signed   By: Hart Robinsons M.D.   On: 03/30/2023 15:50

## 2023-04-16 ENCOUNTER — Inpatient Hospital Stay: Payer: Medicare Other | Attending: Hematology

## 2023-04-16 ENCOUNTER — Inpatient Hospital Stay: Payer: Medicare Other | Admitting: Hematology

## 2023-04-16 ENCOUNTER — Inpatient Hospital Stay: Payer: Medicare Other

## 2023-04-16 VITALS — BP 114/50 | HR 81 | Temp 98.5°F | Resp 18

## 2023-04-16 VITALS — Wt 163.8 lb

## 2023-04-16 DIAGNOSIS — E039 Hypothyroidism, unspecified: Secondary | ICD-10-CM | POA: Diagnosis not present

## 2023-04-16 DIAGNOSIS — I514 Myocarditis, unspecified: Secondary | ICD-10-CM

## 2023-04-16 DIAGNOSIS — C4371 Malignant melanoma of right lower limb, including hip: Secondary | ICD-10-CM

## 2023-04-16 DIAGNOSIS — Z8679 Personal history of other diseases of the circulatory system: Secondary | ICD-10-CM | POA: Diagnosis not present

## 2023-04-16 DIAGNOSIS — Z79620 Long term (current) use of immunosuppressive biologic: Secondary | ICD-10-CM | POA: Diagnosis not present

## 2023-04-16 DIAGNOSIS — C7951 Secondary malignant neoplasm of bone: Secondary | ICD-10-CM | POA: Diagnosis not present

## 2023-04-16 DIAGNOSIS — Z5112 Encounter for antineoplastic immunotherapy: Secondary | ICD-10-CM | POA: Insufficient documentation

## 2023-04-16 DIAGNOSIS — M79651 Pain in right thigh: Secondary | ICD-10-CM | POA: Diagnosis not present

## 2023-04-16 DIAGNOSIS — Z95828 Presence of other vascular implants and grafts: Secondary | ICD-10-CM

## 2023-04-16 LAB — COMPREHENSIVE METABOLIC PANEL
ALT: 12 U/L (ref 0–44)
AST: 21 U/L (ref 15–41)
Albumin: 2.8 g/dL — ABNORMAL LOW (ref 3.5–5.0)
Alkaline Phosphatase: 105 U/L (ref 38–126)
Anion gap: 8 (ref 5–15)
BUN: 9 mg/dL (ref 8–23)
CO2: 27 mmol/L (ref 22–32)
Calcium: 8.4 mg/dL — ABNORMAL LOW (ref 8.9–10.3)
Chloride: 101 mmol/L (ref 98–111)
Creatinine, Ser: 0.85 mg/dL (ref 0.44–1.00)
GFR, Estimated: >60 mL/min (ref >60)
Glucose, Bld: 111 mg/dL — ABNORMAL HIGH (ref 70–99)
Potassium: 3.4 mmol/L — ABNORMAL LOW (ref 3.5–5.1)
Sodium: 136 mmol/L (ref 135–145)
Total Bilirubin: 1 mg/dL (ref <1.2)
Total Protein: 6.4 g/dL — ABNORMAL LOW (ref 6.5–8.1)

## 2023-04-16 LAB — CBC WITH DIFFERENTIAL/PLATELET
Abs Immature Granulocytes: 0.02 10*3/uL (ref 0.00–0.07)
Basophils Absolute: 0 10*3/uL (ref 0.0–0.1)
Basophils Relative: 1 %
Eosinophils Absolute: 0.1 10*3/uL (ref 0.0–0.5)
Eosinophils Relative: 1 %
HCT: 35 % — ABNORMAL LOW (ref 36.0–46.0)
Hemoglobin: 10.5 g/dL — ABNORMAL LOW (ref 12.0–15.0)
Immature Granulocytes: 0 %
Lymphocytes Relative: 16 %
Lymphs Abs: 0.8 10*3/uL (ref 0.7–4.0)
MCH: 27.3 pg (ref 26.0–34.0)
MCHC: 30 g/dL (ref 30.0–36.0)
MCV: 90.9 fL (ref 80.0–100.0)
Monocytes Absolute: 0.5 10*3/uL (ref 0.1–1.0)
Monocytes Relative: 10 %
Neutro Abs: 3.6 10*3/uL (ref 1.7–7.7)
Neutrophils Relative %: 72 %
Platelets: 196 10*3/uL (ref 150–400)
RBC: 3.85 MIL/uL — ABNORMAL LOW (ref 3.87–5.11)
RDW: 14.1 % (ref 11.5–15.5)
WBC: 5 10*3/uL (ref 4.0–10.5)
nRBC: 0 % (ref 0.0–0.2)

## 2023-04-16 LAB — MAGNESIUM: Magnesium: 2 mg/dL (ref 1.7–2.4)

## 2023-04-16 LAB — TROPONIN I (HIGH SENSITIVITY): Troponin I (High Sensitivity): 16 ng/L

## 2023-04-16 LAB — TSH: TSH: 1.936 u[IU]/mL (ref 0.350–4.500)

## 2023-04-16 MED ORDER — SODIUM CHLORIDE 0.9 % IV SOLN
1.0000 mg/kg | Freq: Once | INTRAVENOUS | Status: AC
  Administered 2023-04-16: 80 mg via INTRAVENOUS
  Filled 2023-04-16: qty 16

## 2023-04-16 MED ORDER — CETIRIZINE HCL 10 MG/ML IV SOLN
5.0000 mg | Freq: Once | INTRAVENOUS | Status: AC
  Administered 2023-04-16: 5 mg via INTRAVENOUS
  Filled 2023-04-16: qty 1

## 2023-04-16 MED ORDER — SODIUM CHLORIDE 0.9 % IV SOLN
200.0000 mg | Freq: Once | INTRAVENOUS | Status: AC
  Administered 2023-04-16: 200 mg via INTRAVENOUS
  Filled 2023-04-16: qty 8

## 2023-04-16 MED ORDER — FAMOTIDINE IN NACL 20-0.9 MG/50ML-% IV SOLN
20.0000 mg | Freq: Once | INTRAVENOUS | Status: AC
Start: 2023-04-16 — End: 2023-04-16
  Administered 2023-04-16: 20 mg via INTRAVENOUS
  Filled 2023-04-16: qty 50

## 2023-04-16 MED ORDER — SODIUM CHLORIDE 0.9% FLUSH
10.0000 mL | Freq: Once | INTRAVENOUS | Status: DC
Start: 2023-04-16 — End: 2023-04-16

## 2023-04-16 MED ORDER — POTASSIUM CHLORIDE CRYS ER 20 MEQ PO TBCR
40.0000 meq | EXTENDED_RELEASE_TABLET | Freq: Once | ORAL | Status: AC
  Administered 2023-04-16: 40 meq via ORAL
  Filled 2023-04-16: qty 2

## 2023-04-16 MED ORDER — SODIUM CHLORIDE 0.9 % IV SOLN: Freq: Once | INTRAVENOUS | Status: AC

## 2023-04-16 MED ORDER — HEPARIN SOD (PORK) LOCK FLUSH 100 UNIT/ML IV SOLN
500.0000 [IU] | Freq: Once | INTRAVENOUS | Status: AC | PRN
  Administered 2023-04-16: 500 [IU]

## 2023-04-16 MED ORDER — SODIUM CHLORIDE 0.9% FLUSH
10.0000 mL | INTRAVENOUS | Status: DC | PRN
  Administered 2023-04-16: 10 mL

## 2023-04-16 NOTE — Patient Instructions (Signed)

## 2023-04-16 NOTE — Progress Notes (Signed)
Patient presents today for Keytruda/Yervoy  infusion. Patient is in satisfactory condition with no new complaints voiced.  Vital signs are stable.  Labs reviewed by Dr. Ellin Saba during the office visit and all labs are within treatment parameters. Patient's potassium noted to be 3.4 today. Patient will receive 40 mEq potasium chloride p.o x 1 dose. We will proceed with treatment per MD orders.   Treatment given today per MD orders. Tolerated infusion without adverse affects. Vital signs stable. No complaints at this time. Discharged from clinic ambulatory in stable condition. Alert and oriented x 3. F/U with [facility] as scheduled.

## 2023-04-16 NOTE — Progress Notes (Signed)
Ipilimumab (YERVOY) Patient Monitoring Assessment   Is the patient experiencing any of the following general symptoms?:  [ ] Difficulty performing normal activities [ ] Feeling sluggish or cold all the time [ ] Unusual weight gain [ ] Constant or unusual headaches [ ] Feeling dizzy or faint [ ] Changes in eyesight (blurry vision, double vision, or other vision problems) [ ] Changes in mood or behavior (ex: decreased sex drive, irritability, or forgetfulness) [ ] Starting new medications (ex: steroids, other medications that lower immune response) [X ]Patient is not experiencing any of the general symptoms above.   Gastrointestinal  Patient is having 1 bowel movements each day.  Is this different from baseline? [ ] Yes [X ]No Are your stools watery or do they have a foul smell? [ ] Yes [X ]No Have you seen blood in your stools? [ ] Yes [ X]No Are your stools dark, tarry, or sticky? [ ] Yes [ X]No Are you having pain or tenderness in your belly? [ ] Yes [ X]No  Skin Does your skin itch? [ ] Yes [X ]No Do you have a rash? [ ] Yes [ X]No Has your skin blistered and/or peeled? [ ] Yes [X ]No Do you have sores in your mouth? [ ] Yes [ X]No  Hepatic Has your urine been dark or tea colored? [ ] Yes [ X]No Have you noticed that your skin or the whites of your eyes are turning yellow? [ ] Yes [ X]No Are you bleeding or bruising more easily than normal? [ ] Yes [ X]No Are you nauseous and/or vomiting? [ ] Yes [X ]No Do you have pain on the right side of your stomach? [ ] Yes [ X]No  Neurologic  Are you having unusual weakness of legs, arms, or face? [ ] Yes [ X]No Are you having numbness or tingling in your hands or feet? [ ] Yes [ X]No  Virgina Organ

## 2023-04-16 NOTE — Progress Notes (Signed)
Patient has been examined by Dr. Katragadda. Vital signs and labs have been reviewed by MD - ANC, Creatinine, LFTs, hemoglobin, and platelets are within treatment parameters per M.D. - pt may proceed with treatment.  Primary RN and pharmacy notified.  

## 2023-04-16 NOTE — Patient Instructions (Signed)
[facility] [place] - A DEPT OF [facility]  Discharge Instructions: Thank you for choosing [facility] to provide your oncology and hematology care.  If you have a lab appointment with the Cancer Center - please note that after April 8th, 2024, all labs will be drawn in the cancer center.  You do not have to check in or register with the main entrance as you have in the past but will complete your check-in in the cancer center.  Wear comfortable clothing and clothing appropriate for easy access to any Portacath or PICC line.   We strive to give you quality time with your provider. You may need to reschedule your appointment if you arrive late (15 or more minutes).  Arriving late affects you and other patients whose appointments are after yours.  Also, if you miss three or more appointments without notifying the office, you may be dismissed from the clinic at the provider's discretion.      For prescription refill requests, have your pharmacy contact our office and allow 72 hours for refills to be completed.    Today you received the following chemotherapy and/or immunotherapy agents Keytruda and Yervoy   To help prevent nausea and vomiting after your treatment, we encourage you to take your nausea medication as directed.  BELOW ARE SYMPTOMS THAT SHOULD BE REPORTED IMMEDIATELY: *FEVER GREATER THAN 100.4 F (38 C) OR HIGHER *CHILLS OR SWEATING *NAUSEA AND VOMITING THAT IS NOT CONTROLLED WITH YOUR NAUSEA MEDICATION *UNUSUAL SHORTNESS OF BREATH *UNUSUAL BRUISING OR BLEEDING *URINARY PROBLEMS (pain or burning when urinating, or frequent urination) *BOWEL PROBLEMS (unusual diarrhea, constipation, pain near the anus) TENDERNESS IN MOUTH AND THROAT WITH OR WITHOUT PRESENCE OF ULCERS (sore throat, sores in mouth, or a toothache) UNUSUAL RASH, SWELLING OR PAIN  UNUSUAL VAGINAL DISCHARGE OR ITCHING   Items with * indicate a potential emergency and should be  followed up as soon as possible or go to the Emergency Department if any problems should occur.  Please show the CHEMOTHERAPY ALERT CARD or IMMUNOTHERAPY ALERT CARD at check-in to the Emergency Department and triage nurse.  Should you have questions after your visit or need to cancel or reschedule your appointment, please contact [facility] [place] - A DEPT OF [facility] [phone]  and follow the prompts.  Office hours are 8:00 a.m. to 4:30 p.m. Monday - Friday. Please note that voicemails left after 4:00 p.m. may not be returned until the following business day.  We are closed weekends and major holidays. You have access to a nurse at all times for urgent questions. Please call the main number to the clinic [phone] and follow the prompts.  For any non-urgent questions, you may also contact your provider using MyChart. We now offer e-Visits for anyone 91 and older to request care online for non-urgent symptoms. For details visit [email or web address].   Also download the MyChart app! Go to the app store, search "MyChart", open the app, select , and log in with your MyChart username and password.

## 2023-04-17 LAB — T4: T4, Total: 11.5 ug/dL (ref 4.5–12.0)

## 2023-04-20 ENCOUNTER — Encounter (HOSPITAL_COMMUNITY): Payer: Self-pay | Admitting: Hematology

## 2023-04-20 ENCOUNTER — Encounter: Payer: Self-pay | Admitting: Hematology

## 2023-04-20 DIAGNOSIS — K8020 Calculus of gallbladder without cholecystitis without obstruction: Secondary | ICD-10-CM | POA: Diagnosis not present

## 2023-04-20 DIAGNOSIS — C439 Malignant melanoma of skin, unspecified: Secondary | ICD-10-CM | POA: Diagnosis not present

## 2023-04-20 DIAGNOSIS — I10 Essential (primary) hypertension: Secondary | ICD-10-CM | POA: Diagnosis not present

## 2023-04-22 ENCOUNTER — Inpatient Hospital Stay: Payer: Medicare Other

## 2023-04-23 ENCOUNTER — Ambulatory Visit (INDEPENDENT_AMBULATORY_CARE_PROVIDER_SITE_OTHER): Payer: Medicare Other | Admitting: General Surgery

## 2023-04-23 VITALS — BP 114/70 | HR 95 | Temp 97.5°F | Resp 14 | Ht 62.0 in | Wt 162.0 lb

## 2023-04-23 DIAGNOSIS — K8033 Calculus of bile duct with acute cholangitis with obstruction: Secondary | ICD-10-CM

## 2023-04-23 DIAGNOSIS — R197 Diarrhea, unspecified: Secondary | ICD-10-CM | POA: Diagnosis not present

## 2023-04-23 MED ORDER — VANCOMYCIN HCL 125 MG PO CAPS: 125.0000 mg | ORAL_CAPSULE | Freq: Four times a day (QID) | ORAL | 0 refills | Status: AC

## 2023-04-23 NOTE — H&P (Signed)
[facility] History and Physical  Reason for Referral: Cholangitis, E coli Bacteremia  Referring Physician: [name], MD   Chief Complaint   New Patient (Initial Visit)     Elizabeth Norman is a 82 y.o. female.  HPI: Elizabeth Norman is an 82 yo who has recent admission for cholangitis, E coli bacteremia from choledocholithiasis s/p ERCP. She did well after her ERCP and recovered well. She was seen by Cardiology who felt like she was an acceptable risk for surgery. She has a history of metastatic melanoma that is stable and is on treatment with Dr. Ellin Saba. Dr. Ellin Saba has given her 12/5 and says the patient can get her surgery in the next 2 weeks. The patient is here today with her son. Her biggest issue since her admission was diarrhea that was watery and has been as much as 15 times a day. She has been taking some imodium.    Past Medical History:  Diagnosis Date   Arthritis    Hypertension    Malignant melanoma of lower leg, right (HCC) 03/27/2021   Port-A-Cath in place 04/30/2021    Past Surgical History:  Procedure Laterality Date   BIOPSY  04/02/2023   Procedure: BIOPSY;  Surgeon: Meryl Dare, MD;  Location: [facility] ENDOSCOPY;  Service: Gastroenterology;;   CATARACT EXTRACTION W/PHACO Right 10/03/2014   Procedure: CATARACT EXTRACTION PHACO AND INTRAOCULAR LENS PLACEMENT (IOC);  Surgeon: Jethro Bolus, MD;  Location: [facility] ORS;  Service: Ophthalmology;  Laterality: Right;  CDE:10.09   CATARACT EXTRACTION W/PHACO Left 10/10/2014   Procedure: CATARACT EXTRACTION PHACO AND INTRAOCULAR LENS PLACEMENT (IOC);  Surgeon: Jethro Bolus, MD;  Location: [facility] ORS;  Service: Ophthalmology;  Laterality: Left;  CDE:8.69   CORNEAL 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   CORNEAL TRANSPLANT Left 2020   DENTAL RESTORATION/EXTRACTION WITH X-RAY     ERCP N/A 04/02/2023   Procedure: ENDOSCOPIC RETROGRADE CHOLANGIOPANCREATOGRAPHY (ERCP);  Surgeon: Meryl Dare, MD;  Location: [facility] ENDOSCOPY;   Service: Gastroenterology;  Laterality: N/A;   IR IMAGING GUIDED PORT INSERTION  04/05/2021   LEFT HEART CATH AND CORONARY ANGIOGRAPHY N/A 05/28/2021   Procedure: LEFT HEART CATH AND CORONARY ANGIOGRAPHY;  Surgeon: [name], MD;  Location: [facility] INVASIVE CV LAB;  Service: Cardiovascular;  Laterality: N/A;   MELANOMA EXCISION Right 02/12/2021   Procedure: WIDE LOCAL EXCISION RIGHT LOWER LEG MELANOMA , ADVANCEMENT FLAP CLOSURE DEFECT;  Surgeon: Almond Lint, MD;  Location: [facility] OR;  Service: General;  Laterality: Right;   REMOVAL OF STONES  04/02/2023   Procedure: REMOVAL OF STONES;  Surgeon: Meryl Dare, MD;  Location: [facility] ENDOSCOPY;  Service: Gastroenterology;;   [place] Day  04/02/2023   Procedure: SCLEROTHERAPY;  Surgeon: Meryl Dare, MD;  Location: [facility] ENDOSCOPY;  Service: Gastroenterology;;   SENTINEL NODE BIOPSY Right 02/12/2021   Procedure: SENTINEL NODE BIOPSY;  Surgeon: Almond Lint, MD;  Location: [facility] OR;  Service: General;  Laterality: Right;   SPHINCTEROTOMY  04/02/2023   Procedure: SPHINCTEROTOMY;  Surgeon: Meryl Dare, MD;  Location: [facility] ENDOSCOPY;  Service: Gastroenterology;;    Family History  Problem Relation Age of Onset   Cancer Father    Hypertension Sister    Hypertension Sister     Social History   Tobacco Use   Smoking status: Never   Smokeless tobacco: Never  Vaping Use   Vaping status: Never Used  Substance Use Topics   Alcohol use: No   Drug use: No    Medications: I have reviewed the patient's  current medications. Allergies as of 04/23/2023       Reactions   Tape    Pulls skin, please use paper tape        Medication List        Accurate as of April 23, 2023  3:45 PM. If you have any questions, ask your nurse or doctor.          ALPRAZolam 0.5 MG tablet Commonly known as: XANAX Take 0.5 mg by mouth 3 (three) times daily.   calcium carbonate 1250 (500 Ca) MG tablet Commonly known as: OS-CAL - dosed in mg of  elemental calcium Take 1 tablet by mouth 3 (three) times a week.   cholecalciferol 1000 units tablet Commonly known as: VITAMIN D Take 1,000 Units by mouth 3 (three) times a week.   famotidine 40 MG tablet Commonly known as: PEPCID Take 40 mg by mouth daily.   levothyroxine 125 MCG tablet Commonly known as: SYNTHROID Take 1 tablet (125 mcg total) by mouth daily before breakfast. What changed: how much to take   metoprolol tartrate 25 MG tablet Commonly known as: LOPRESSOR Take 1 tablet (25 mg total) by mouth 2 (two) times daily. Hold until follow-up with your doctor/cardiologist   omeprazole 40 MG capsule Commonly known as: PRILOSEC Take 40 mg by mouth daily.   ondansetron 4 MG tablet Commonly known as: Zofran Take 1 tablet (4 mg total) by mouth every 8 (eight) hours as needed for nausea or vomiting.   VITAMIN B-12 PO Take 1 tablet by mouth once a week.         ROS:  A comprehensive review of systems was negative except for: Gastrointestinal: positive for abdominal pain, diarrhea, and reflux symptoms Genitourinary: positive for frequency Musculoskeletal: positive for back pain and joint pain  Blood pressure 114/70, pulse 95, temperature (!) 97.5 F (36.4 C), temperature source Oral, resp. rate 14, height 5\' 2"  (1.575 m), weight 162 lb (73.5 kg), SpO2 93%. Physical Exam Vitals reviewed.  HENT:     Head: Normocephalic.     Nose: Nose normal.     Mouth/Throat:     Mouth: Mucous membranes are moist.  Eyes:     Extraocular Movements: Extraocular movements intact.  Cardiovascular:     Rate and Rhythm: Normal rate.  Pulmonary:     Effort: Pulmonary effort is normal.     Breath sounds: Normal breath sounds.  Abdominal:     General: There is no distension.     Palpations: Abdomen is soft.     Tenderness: There is no abdominal tenderness.  Musculoskeletal:        General: No swelling. Normal range of motion.     Cervical back: Normal range of motion.  Skin:     General: Skin is warm.  Neurological:     General: No focal deficit present.     Mental Status: She is alert and oriented to person, place, and time.  Psychiatric:        Mood and Affect: Mood normal.        Behavior: Behavior normal.        Thought Content: Thought content normal.        Judgment: Judgment normal.     Results: CLINICAL DATA:  Sepsis, history of melanoma   EXAM: CT CHEST, ABDOMEN AND PELVIS WITHOUT CONTRAST   TECHNIQUE: Multidetector CT imaging of the chest, abdomen and pelvis was performed following the standard protocol without IV contrast.   RADIATION DOSE REDUCTION: This exam  was performed according to the departmental dose-optimization program which includes automated exposure control, adjustment of the mA and/or kV according to patient size and/or use of iterative reconstruction technique.   COMPARISON:  01/01/2023, 05/27/2021   FINDINGS: CT CHEST FINDINGS   Cardiovascular: Unenhanced imaging of the heart is unremarkable without pericardial effusion. Calcifications are seen within the mitral and aortic valves. Normal caliber of the thoracic aorta. Atherosclerosis of the aorta and coronary vasculature. Right chest wall port via internal jugular approach tip within the superior vena cava.   Mediastinum/Nodes: No enlarged mediastinal, hilar, or axillary lymph nodes. Thyroid gland, trachea, and esophagus demonstrate no significant findings.   Lungs/Pleura: No acute airspace disease, effusion, or pneumothorax. Central airways are patent.   Musculoskeletal: Prior healed anterior right rib fractures. No acute or destructive bony abnormalities. Reconstructed images demonstrate no additional findings.   CT ABDOMEN PELVIS FINDINGS   Hepatobiliary: Cholelithiasis without evidence of acute cholecystitis. There is mild intrahepatic biliary duct dilation, with prominent dilation of the common bile duct measuring up to 14 mm, increased since prior exams.  No evidence of choledocholithiasis.   Pancreas: Diffuse pancreatic atrophy. No acute inflammatory changes.   Spleen: Splenomegaly, measuring 14.1 x 7.1 x 10.0 cm. No focal parenchymal abnormality on this unenhanced exam.   Adrenals/Urinary Tract: No urinary tract calculi or obstructive uropathy. The adrenals and bladder are unremarkable.   Stomach/Bowel: No bowel obstruction or ileus. Distal colonic diverticulosis without evidence of diverticulitis. Normal appendix right lower quadrant. No bowel wall thickening or inflammatory change.   Vascular/Lymphatic: Progressive metastatic adenopathy in the right lower pelvis and inguinal regions. Index lymph nodes are as follows:   Right inguinal, image 110/2, 21 mm short axis.   Deep right inguinal, image 94/2, 35 mm short axis.   Right external iliac, image 98/2, 27 mm short axis.   No other pathologic adenopathy. Diffuse atherosclerosis of the aorta and its branches.   Reproductive: Uterus and bilateral adnexa are unremarkable.   Other: No free fluid or free intraperitoneal gas. No abdominal wall hernia. Stable presacral fat containing mass measures 7.2 x 6.5 cm, previously characterized as a myelolipoma based on imaging characteristics and lack of metabolic activity.   Musculoskeletal: No acute or destructive bony abnormalities. Reconstructed images demonstrate no additional findings.   IMPRESSION: 1. Progressive metastatic adenopathy in the right external iliac and inguinal regions, consistent with worsening metastatic melanoma. 2. Progressive intrahepatic and extrahepatic biliary duct dilation, of uncertain etiology or significance. No evidence of choledocholithiasis. 3. Cholelithiasis without cholecystitis. 4. Splenomegaly. 5. 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 6. Distal colonic diverticulosis without diverticulitis. 7.  Aortic Atherosclerosis (ICD10-I70.0). Coronary artery atherosclerosis.     Electronically Signed   By: Sharlet Salina M.D.   On: 03/30/2023 18:00  CLINICAL DATA:  Jaundice.  History of melanoma.   EXAM: MRI ABDOMEN WITHOUT AND WITH CONTRAST (INCLUDING MRCP)   TECHNIQUE: Multiplanar multisequence MR imaging of the abdomen was performed both before and after the administration of intravenous contrast. Heavily T2-weighted images of the biliary and pancreatic ducts were obtained, and three-dimensional MRCP images were rendered by post processing.   CONTRAST:  8mL GADAVIST GADOBUTROL 1 MMOL/ML IV SOLN   COMPARISON:  CT chest, abdomen and pelvis 03/30/2023   FINDINGS: Lower chest: Trace bilateral pleural effusions.   Hepatobiliary: There is diffuse hepatic steatosis with relative increased fatty deposition involving the left lobe compared with the right. No focal enhancing liver  lesions. There is relative hypertrophy of the lateral segment of left hepatic lobe. Capsular retraction with underlying parenchymal scarring identified within segment 7 of the liver. No discrete enhancing liver lesions identified. Stones identified within the dependent portion of the gallbladder measuring up to 0.9 cm. The gallbladder wall thickness measures 3 mm. No significant pericholecystic fluid. Common bile duct dilatation is identified with moderate increased intrahepatic bile duct dilatation. The CBD measures up to 1.5 cm. Sludge noted within the distal half of the common bile duct. There is an impacted stone at the ampulla measuring 8 mm, image 15/10 and image 34/5.   Pancreas: No mass, inflammatory changes, or other parenchymal abnormality identified.   Spleen: The spleen measures 11.7 x 5.6 by 12.1 cm. (Volume = 420 cm^3)   Adrenals/Urinary Tract: Normal appearance of the adrenal glands. No suspicious mass or hydronephrosis. Small left kidney cysts are noted. The largest measures 7 mm. No  specific follow-up imaging recommended.   Stomach/Bowel: Stomach is normal. No dilated loops of large or small bowel.   Vascular/Lymphatic: Aortic atherosclerosis. Periportal lymph node measures 1.2 cm, image 78/1102.   Other: No ascites. Body wall edema is noted extending along both flanks.   Musculoskeletal: No suspicious bone lesions identified.   IMPRESSION: 1. Impacted stone at the ampulla measuring 8 mm with associated intra and extrahepatic bile duct dilatation. 2. Cholelithiasis. 3. Diffuse hepatic steatosis with relative increased fatty deposition involving the left lobe compared with the right. There is relative hypertrophy of the lateral segment of left hepatic lobe. Capsular retraction with underlying parenchymal scarring identified within segment 7 of the liver. Imaging findings may be seen in the setting of early cirrhosis. 4. Trace bilateral pleural effusions. 5. Body wall edema extending along both flanks. 6. Mild splenomegaly. 7. Aortic Atherosclerosis (ICD10-I70.0).   These results will be called to the ordering clinician or representative by the Radiologist Assistant, and communication documented in the PACS or [employer].     Electronically Signed   By: Signa Kell M.D.   On: 04/02/2023 05:25   Assessment & Plan:  ZAYRA MENON is a 82 y.o. female with recent cholangitis, E coli bacteremia from choledocholithiasis s/p ERCP. She needs her gallbladder out. Discussed gallbladder pathology and reason for removal.   I am worried about the  diarrhea. I am worried she has C dif given her antibiotics. Stat Stool studies now. Vancomycin treatment sent to her pharmacy.   PLAN: I counseled the patient about the indication, risks and benefits of robotic assisted laparoscopic cholecystectomy.  She understands there is a very small chance for bleeding, infection, injury to normal structures (including common bile duct), conversion to open surgery, persistent  symptoms, evolution of postcholecystectomy diarrhea, need for secondary interventions, anesthesia reaction, cardiopulmonary issues and other risks not specifically detailed here. I described the expected recovery, the plan for follow-up and the restrictions during the recovery phase.  All questions were answered.   All questions were answered to the satisfaction of the patient and family. Will decide the day of surgery if she may need to stay overnight.    Elizabeth Norman 04/23/2023, 3:45 PM

## 2023-04-23 NOTE — Patient Instructions (Addendum)
Go get your Stool study ASAP. After the stool studies are turned in you can start the vancomycin to treat the loose stools and likely C dif infection.   Plan for surgery 12/18 and can decide that day if you want to stay overnight.

## 2023-04-23 NOTE — Progress Notes (Signed)
[facility] History and Physical  Reason for Referral: Cholangitis, E coli Bacteremia  Referring Physician: [name], MD   Chief Complaint   New Patient (Initial Visit)     Elizabeth Norman is a 82 y.o. female.  HPI: Elizabeth Norman is an 82 yo who has recent admission for cholangitis, E coli bacteremia from choledocholithiasis s/p ERCP. She did well after her ERCP and recovered well. She was seen by Cardiology who felt like she was an acceptable risk for surgery. She has a history of metastatic melanoma that is stable and is on treatment with Dr. Ellin Saba. Dr. Ellin Saba has given her 12/5 and says the patient can get her surgery in the next 2 weeks. The patient is here today with her son. Her biggest issue since her admission was diarrhea that was watery and has been as much as 15 times a day. She has been taking some imodium.    Past Medical History:  Diagnosis Date   Arthritis    Hypertension    Malignant melanoma of lower leg, right (HCC) 03/27/2021   Port-A-Cath in place 04/30/2021    Past Surgical History:  Procedure Laterality Date   BIOPSY  04/02/2023   Procedure: BIOPSY;  Surgeon: Meryl Dare, MD;  Location: [facility] ENDOSCOPY;  Service: Gastroenterology;;   CATARACT EXTRACTION W/PHACO Right 10/03/2014   Procedure: CATARACT EXTRACTION PHACO AND INTRAOCULAR LENS PLACEMENT (IOC);  Surgeon: Jethro Bolus, MD;  Location: [facility] ORS;  Service: Ophthalmology;  Laterality: Right;  CDE:10.09   CATARACT EXTRACTION W/PHACO Left 10/10/2014   Procedure: CATARACT EXTRACTION PHACO AND INTRAOCULAR LENS PLACEMENT (IOC);  Surgeon: Jethro Bolus, MD;  Location: [facility] ORS;  Service: Ophthalmology;  Laterality: Left;  CDE:8.69   CORNEAL 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   CORNEAL TRANSPLANT Left 2020   DENTAL RESTORATION/EXTRACTION WITH X-RAY     ERCP N/A 04/02/2023   Procedure: ENDOSCOPIC RETROGRADE CHOLANGIOPANCREATOGRAPHY (ERCP);  Surgeon: Meryl Dare, MD;  Location: [facility] ENDOSCOPY;   Service: Gastroenterology;  Laterality: N/A;   IR IMAGING GUIDED PORT INSERTION  04/05/2021   LEFT HEART CATH AND CORONARY ANGIOGRAPHY N/A 05/28/2021   Procedure: LEFT HEART CATH AND CORONARY ANGIOGRAPHY;  Surgeon: [name], MD;  Location: [facility] INVASIVE CV LAB;  Service: Cardiovascular;  Laterality: N/A;   MELANOMA EXCISION Right 02/12/2021   Procedure: WIDE LOCAL EXCISION RIGHT LOWER LEG MELANOMA , ADVANCEMENT FLAP CLOSURE DEFECT;  Surgeon: Almond Lint, MD;  Location: [facility] OR;  Service: General;  Laterality: Right;   REMOVAL OF STONES  04/02/2023   Procedure: REMOVAL OF STONES;  Surgeon: Meryl Dare, MD;  Location: [facility] ENDOSCOPY;  Service: Gastroenterology;;   S[place] Day  04/02/2023   Procedure: SCLEROTHERAPY;  Surgeon: Meryl Dare, MD;  Location: [facility] ENDOSCOPY;  Service: Gastroenterology;;   SENTINEL NODE BIOPSY Right 02/12/2021   Procedure: SENTINEL NODE BIOPSY;  Surgeon: Almond Lint, MD;  Location: [facility] OR;  Service: General;  Laterality: Right;   SPHINCTEROTOMY  04/02/2023   Procedure: SPHINCTEROTOMY;  Surgeon: Meryl Dare, MD;  Location: [facility] ENDOSCOPY;  Service: Gastroenterology;;    Family History  Problem Relation Age of Onset   Cancer Father    Hypertension Sister    Hypertension Sister     Social History   Tobacco Use   Smoking status: Never   Smokeless tobacco: Never  Vaping Use   Vaping status: Never Used  Substance Use Topics   Alcohol use: No   Drug use: No    Medications: I have reviewed the patient's  current medications. Allergies as of 04/23/2023       Reactions   Tape    Pulls skin, please use paper tape        Medication List        Accurate as of April 23, 2023  3:45 PM. If you have any questions, ask your nurse or doctor.          ALPRAZolam 0.5 MG tablet Commonly known as: XANAX Take 0.5 mg by mouth 3 (three) times daily.   calcium carbonate 1250 (500 Ca) MG tablet Commonly known as: OS-CAL - dosed in mg of  elemental calcium Take 1 tablet by mouth 3 (three) times a week.   cholecalciferol 1000 units tablet Commonly known as: VITAMIN D Take 1,000 Units by mouth 3 (three) times a week.   famotidine 40 MG tablet Commonly known as: PEPCID Take 40 mg by mouth daily.   levothyroxine 125 MCG tablet Commonly known as: SYNTHROID Take 1 tablet (125 mcg total) by mouth daily before breakfast. What changed: how much to take   metoprolol tartrate 25 MG tablet Commonly known as: LOPRESSOR Take 1 tablet (25 mg total) by mouth 2 (two) times daily. Hold until follow-up with your doctor/cardiologist   omeprazole 40 MG capsule Commonly known as: PRILOSEC Take 40 mg by mouth daily.   ondansetron 4 MG tablet Commonly known as: Zofran Take 1 tablet (4 mg total) by mouth every 8 (eight) hours as needed for nausea or vomiting.   VITAMIN B-12 PO Take 1 tablet by mouth once a week.         ROS:  A comprehensive review of systems was negative except for: Gastrointestinal: positive for abdominal pain, diarrhea, and reflux symptoms Genitourinary: positive for frequency Musculoskeletal: positive for back pain and joint pain  Blood pressure 114/70, pulse 95, temperature (!) 97.5 F (36.4 C), temperature source Oral, resp. rate 14, height 5\' 2"  (1.575 m), weight 162 lb (73.5 kg), SpO2 93%. Physical Exam Vitals reviewed.  HENT:     Head: Normocephalic.     Nose: Nose normal.     Mouth/Throat:     Mouth: Mucous membranes are moist.  Eyes:     Extraocular Movements: Extraocular movements intact.  Cardiovascular:     Rate and Rhythm: Normal rate.  Pulmonary:     Effort: Pulmonary effort is normal.     Breath sounds: Normal breath sounds.  Abdominal:     General: There is no distension.     Palpations: Abdomen is soft.     Tenderness: There is no abdominal tenderness.  Musculoskeletal:        General: No swelling. Normal range of motion.     Cervical back: Normal range of motion.  Skin:     General: Skin is warm.  Neurological:     General: No focal deficit present.     Mental Status: She is alert and oriented to person, place, and time.  Psychiatric:        Mood and Affect: Mood normal.        Behavior: Behavior normal.        Thought Content: Thought content normal.        Judgment: Judgment normal.     Results: CLINICAL DATA:  Sepsis, history of melanoma   EXAM: CT CHEST, ABDOMEN AND PELVIS WITHOUT CONTRAST   TECHNIQUE: Multidetector CT imaging of the chest, abdomen and pelvis was performed following the standard protocol without IV contrast.   RADIATION DOSE REDUCTION: This exam  was performed according to the departmental dose-optimization program which includes automated exposure control, adjustment of the mA and/or kV according to patient size and/or use of iterative reconstruction technique.   COMPARISON:  01/01/2023, 05/27/2021   FINDINGS: CT CHEST FINDINGS   Cardiovascular: Unenhanced imaging of the heart is unremarkable without pericardial effusion. Calcifications are seen within the mitral and aortic valves. Normal caliber of the thoracic aorta. Atherosclerosis of the aorta and coronary vasculature. Right chest wall port via internal jugular approach tip within the superior vena cava.   Mediastinum/Nodes: No enlarged mediastinal, hilar, or axillary lymph nodes. Thyroid gland, trachea, and esophagus demonstrate no significant findings.   Lungs/Pleura: No acute airspace disease, effusion, or pneumothorax. Central airways are patent.   Musculoskeletal: Prior healed anterior right rib fractures. No acute or destructive bony abnormalities. Reconstructed images demonstrate no additional findings.   CT ABDOMEN PELVIS FINDINGS   Hepatobiliary: Cholelithiasis without evidence of acute cholecystitis. There is mild intrahepatic biliary duct dilation, with prominent dilation of the common bile duct measuring up to 14 mm, increased since prior exams.  No evidence of choledocholithiasis.   Pancreas: Diffuse pancreatic atrophy. No acute inflammatory changes.   Spleen: Splenomegaly, measuring 14.1 x 7.1 x 10.0 cm. No focal parenchymal abnormality on this unenhanced exam.   Adrenals/Urinary Tract: No urinary tract calculi or obstructive uropathy. The adrenals and bladder are unremarkable.   Stomach/Bowel: No bowel obstruction or ileus. Distal colonic diverticulosis without evidence of diverticulitis. Normal appendix right lower quadrant. No bowel wall thickening or inflammatory change.   Vascular/Lymphatic: Progressive metastatic adenopathy in the right lower pelvis and inguinal regions. Index lymph nodes are as follows:   Right inguinal, image 110/2, 21 mm short axis.   Deep right inguinal, image 94/2, 35 mm short axis.   Right external iliac, image 98/2, 27 mm short axis.   No other pathologic adenopathy. Diffuse atherosclerosis of the aorta and its branches.   Reproductive: Uterus and bilateral adnexa are unremarkable.   Other: No free fluid or free intraperitoneal gas. No abdominal wall hernia. Stable presacral fat containing mass measures 7.2 x 6.5 cm, previously characterized as a myelolipoma based on imaging characteristics and lack of metabolic activity.   Musculoskeletal: No acute or destructive bony abnormalities. Reconstructed images demonstrate no additional findings.   IMPRESSION: 1. Progressive metastatic adenopathy in the right external iliac and inguinal regions, consistent with worsening metastatic melanoma. 2. Progressive intrahepatic and extrahepatic biliary duct dilation, of uncertain etiology or significance. No evidence of choledocholithiasis. 3. Cholelithiasis without cholecystitis. 4. Splenomegaly. 5. 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 6. Distal colonic diverticulosis without diverticulitis. 7.  Aortic Atherosclerosis (ICD10-I70.0). Coronary artery atherosclerosis.     Electronically Signed   By: Sharlet Salina M.D.   On: 03/30/2023 18:00  CLINICAL DATA:  Jaundice.  History of melanoma.   EXAM: MRI ABDOMEN WITHOUT AND WITH CONTRAST (INCLUDING MRCP)   TECHNIQUE: Multiplanar multisequence MR imaging of the abdomen was performed both before and after the administration of intravenous contrast. Heavily T2-weighted images of the biliary and pancreatic ducts were obtained, and three-dimensional MRCP images were rendered by post processing.   CONTRAST:  8mL GADAVIST GADOBUTROL 1 MMOL/ML IV SOLN   COMPARISON:  CT chest, abdomen and pelvis 03/30/2023   FINDINGS: Lower chest: Trace bilateral pleural effusions.   Hepatobiliary: There is diffuse hepatic steatosis with relative increased fatty deposition involving the left lobe compared with the right. No focal enhancing liver  lesions. There is relative hypertrophy of the lateral segment of left hepatic lobe. Capsular retraction with underlying parenchymal scarring identified within segment 7 of the liver. No discrete enhancing liver lesions identified. Stones identified within the dependent portion of the gallbladder measuring up to 0.9 cm. The gallbladder wall thickness measures 3 mm. No significant pericholecystic fluid. Common bile duct dilatation is identified with moderate increased intrahepatic bile duct dilatation. The CBD measures up to 1.5 cm. Sludge noted within the distal half of the common bile duct. There is an impacted stone at the ampulla measuring 8 mm, image 15/10 and image 34/5.   Pancreas: No mass, inflammatory changes, or other parenchymal abnormality identified.   Spleen: The spleen measures 11.7 x 5.6 by 12.1 cm. (Volume = 420 cm^3)   Adrenals/Urinary Tract: Normal appearance of the adrenal glands. No suspicious mass or hydronephrosis. Small left kidney cysts are noted. The largest measures 7 mm. No  specific follow-up imaging recommended.   Stomach/Bowel: Stomach is normal. No dilated loops of large or small bowel.   Vascular/Lymphatic: Aortic atherosclerosis. Periportal lymph node measures 1.2 cm, image 78/1102.   Other: No ascites. Body wall edema is noted extending along both flanks.   Musculoskeletal: No suspicious bone lesions identified.   IMPRESSION: 1. Impacted stone at the ampulla measuring 8 mm with associated intra and extrahepatic bile duct dilatation. 2. Cholelithiasis. 3. Diffuse hepatic steatosis with relative increased fatty deposition involving the left lobe compared with the right. There is relative hypertrophy of the lateral segment of left hepatic lobe. Capsular retraction with underlying parenchymal scarring identified within segment 7 of the liver. Imaging findings may be seen in the setting of early cirrhosis. 4. Trace bilateral pleural effusions. 5. Body wall edema extending along both flanks. 6. Mild splenomegaly. 7. Aortic Atherosclerosis (ICD10-I70.0).   These results will be called to the ordering clinician or representative by the Radiologist Assistant, and communication documented in the PACS or [employer].     Electronically Signed   By: Signa Kell M.D.   On: 04/02/2023 05:25   Assessment & Plan:  Elizabeth Norman is a 82 y.o. female with recent cholangitis, E coli bacteremia from choledocholithiasis s/p ERCP. She needs her gallbladder out. Discussed gallbladder pathology and reason for removal.   I am worried about the  diarrhea. I am worried she has C dif given her antibiotics. Stat Stool studies now. Vancomycin treatment sent to her pharmacy.   PLAN: I counseled the patient about the indication, risks and benefits of robotic assisted laparoscopic cholecystectomy.  She understands there is a very small chance for bleeding, infection, injury to normal structures (including common bile duct), conversion to open surgery, persistent  symptoms, evolution of postcholecystectomy diarrhea, need for secondary interventions, anesthesia reaction, cardiopulmonary issues and other risks not specifically detailed here. I described the expected recovery, the plan for follow-up and the restrictions during the recovery phase.  All questions were answered.   All questions were answered to the satisfaction of the patient and family. Will decide the day of surgery if she may need to stay overnight.    Lucretia Roers 04/23/2023, 3:45 PM

## 2023-04-24 ENCOUNTER — Encounter (HOSPITAL_COMMUNITY): Payer: Self-pay

## 2023-04-24 DIAGNOSIS — R197 Diarrhea, unspecified: Secondary | ICD-10-CM | POA: Diagnosis not present

## 2023-04-27 ENCOUNTER — Inpatient Hospital Stay: Payer: Medicare Other

## 2023-04-27 ENCOUNTER — Inpatient Hospital Stay: Payer: Medicare Other | Admitting: Hematology

## 2023-04-27 ENCOUNTER — Encounter (HOSPITAL_COMMUNITY)
Admission: RE | Admit: 2023-04-27 | Discharge: 2023-04-27 | Disposition: A | Discharge status: Home / Self Care | Payer: Medicare Other | Source: Ambulatory Visit | Attending: General Surgery | Admitting: General Surgery

## 2023-04-27 ENCOUNTER — Encounter (HOSPITAL_COMMUNITY): Payer: Self-pay

## 2023-04-27 LAB — C DIFFICILE, CYTOTOXIN B

## 2023-04-27 LAB — C DIFFICILE TOXINS A+B W/RFLX: C difficile Toxins A+B, EIA: NEGATIVE

## 2023-04-27 NOTE — Patient Instructions (Signed)
Elizabeth Norman  04/27/2023     @PREFPERIOPPHARMACY @   Your procedure is scheduled on 04/29/2023.  Report to [facility] at 10:15 A.M.  Call this number if you have problems the morning of surgery:  [phone]  If you experience any cold or flu symptoms such as cough, fever, chills, shortness of breath, etc. between now and your scheduled surgery, please notify us at the above number.   Remember:    Do not eat anything after midnight.    You may drink clear liquids until 8:15am .  Clear liquids allowed are:                    Water, Carbonated beverages (diabetics please choose diet or no sugar options), Clear Tea (No creamer, milk, or cream, including half & half and powdered creamer), and Clear Sports drink (No red color; diabetics please choose diet or no sugar options)    Take these medicines the morning of surgery with A SIP OF WATER : Omeprazole and Xanax (if needed)     Do not wear jewelry, make-up or nail polish, including gel polish,  artificial nails, or any other type of covering on natural nails (fingers and  toes).  Do not wear lotions, powders, or perfumes, or deodorant.  Do not shave 48 hours prior to surgery.  Men may shave face and neck.  Do not bring valuables to the hospital.  [facility] is not responsible for any belongings or valuables.  Contacts, dentures or bridgework may not be worn into surgery.  Leave your suitcase in the car.  After surgery it may be brought to your room.  For patients admitted to the hospital, discharge time will be determined by your treatment team.  Patients discharged the day of surgery will not be allowed to drive home.   Name and phone number of your driver:   Family Special instructions:  N/A  Please read over the following fact sheets that you were given. Care and Recovery After Surgery    Minimally Invasive Cholecystectomy Minimally invasive cholecystectomy is surgery to remove the gallbladder. The gallbladder is a  pear-shaped organ that lies beneath the liver on the right side of the body. The gallbladder stores bile, which is a fluid that helps the body digest fats. Cholecystectomy is often done to treat inflammation (irritation and swelling) of the gallbladder (cholecystitis). This condition is usually caused by a buildup of gallstones (cholelithiasis) in the gallbladder or when the fluid in the gall bladder becomes stagnant because gallstones get stuck in the ducts (tubes) and block the flow of bile. This can result in inflammation and pain. In severe cases, emergency surgery may be required. This procedure is done through small incisions in the abdomen, instead of one large incision. It is also called laparoscopic surgery. A thin scope with a camera (laparoscope) is inserted through one incision. Then surgical instruments are inserted through the other incisions. In some cases, a minimally invasive surgery may need to be changed to a surgery that is done through a larger incision. This is called open surgery. Tell a health care provider about: Any allergies you have. All medicines you are taking, including vitamins, herbs, eye drops, creams, and over-the-counter medicines. Any problems you or family members have had with anesthetic medicines. Any bleeding problems you have. Any surgeries you have had. Any medical conditions you have. Whether you are pregnant or may be pregnant. What are the risks? Generally, this is a safe procedure.  However, problems may occur, including: Infection. Bleeding. Allergic reactions to medicines. Damage to nearby structures or organs. A gallstone remaining in the common bile duct. The common bile duct carries bile from the gallbladder to the small intestine. A bile leak from the liver or cystic duct after your gallbladder is removed. What happens before the procedure? When to stop eating and drinking Follow instructions from your health care provider about what you may eat  and drink before your procedure. These may include: 8 hours before the procedure Stop eating most foods. Do not eat meat, fried foods, or fatty foods. Eat only light foods, such as toast or crackers. All liquids are okay except energy drinks and alcohol. 6 hours before the procedure Stop eating. Drink only clear liquids, such as water, clear fruit juice, black coffee, plain tea, and sports drinks. Do not drink energy drinks or alcohol. 2 hours before the procedure Stop drinking all liquids. You may be allowed to take medicines with small sips of water. If you do not follow your health care provider's instructions, your procedure may be delayed or canceled. Medicines Ask your health care provider about: Changing or stopping your regular medicines. This is especially important if you are taking diabetes medicines or blood thinners. Taking medicines such as aspirin and ibuprofen. These medicines can thin your blood. Do not take these medicines unless your health care provider tells you to take them. Taking over-the-counter medicines, vitamins, herbs, and supplements. General instructions If you will be going home right after the procedure, plan to have a responsible adult: Take you home from the hospital or clinic. You will not be allowed to drive. Care for you for the time you are told. Do not use any products that contain nicotine or tobacco for at least 4 weeks before the procedure. These products include cigarettes, chewing tobacco, and vaping devices, such as e-cigarettes. If you need help quitting, ask your health care provider. Ask your health care provider: How your surgery site will be marked. What steps will be taken to help prevent infection. These may include: Removing hair at the surgery site. Washing skin with a germ-killing soap. Taking antibiotic medicine. What happens during the procedure?  An IV will be inserted into one of your veins. You will be given one or both of  the following: A medicine to help you relax (sedative). A medicine to make you fall asleep (general anesthetic). Your surgeon will make several small incisions in your abdomen. The laparoscope will be inserted through one of the small incisions. The camera on the laparoscope will send images to a monitor in the operating room. This lets your surgeon see inside your abdomen. A gas will be pumped into your abdomen. This will expand your abdomen to give the surgeon more room to perform the surgery. Other tools that are needed for the procedure will be inserted through the other incisions. The gallbladder will be removed through one of the incisions. Your common bile duct may be examined. If stones are found in the common bile duct, they may be removed. After your gallbladder has been removed, the incisions will be closed with stitches (sutures), staples, or skin glue. Your incisions will be covered with a bandage (dressing). The procedure may vary among health care providers and hospitals. What happens after the procedure? Your blood pressure, heart rate, breathing rate, and blood oxygen level will be monitored until you leave the hospital or clinic. You will be given medicines as needed to control your pain.  You may have a drain placed in the incision. The drain will be removed a day or two after the procedure. Summary Minimally invasive cholecystectomy, also called laparoscopic cholecystectomy, is surgery to remove the gallbladder using small incisions. Tell your health care provider about all the medical conditions you have and all the medicines you are taking for those conditions. Before the procedure, follow instructions about when to stop eating and drinking and changing or stopping medicines. Plan to have a responsible adult care for you for the time you are told after you leave the hospital or clinic. This information is not intended to replace advice given to you by your health care  provider. Make sure you discuss any questions you have with your health care provider. Document Revised: 10/30/2020 Document Reviewed: 10/30/2020 Elsevier Patient Education  2024 [employer].

## 2023-04-29 ENCOUNTER — Encounter (HOSPITAL_COMMUNITY)
Admission: RE | Disposition: A | Discharge status: Home / Self Care | Payer: Self-pay | Source: Home / Self Care | Attending: General Surgery

## 2023-04-29 ENCOUNTER — Encounter (HOSPITAL_COMMUNITY): Payer: Self-pay | Admitting: General Surgery

## 2023-04-29 ENCOUNTER — Observation Stay (HOSPITAL_COMMUNITY)
Admission: RE | Admit: 2023-04-29 | Discharge: 2023-04-30 | Disposition: A | Discharge status: Home / Self Care | Payer: Medicare Other | Attending: General Surgery | Admitting: General Surgery

## 2023-04-29 ENCOUNTER — Ambulatory Visit (HOSPITAL_COMMUNITY): Payer: Medicare Other | Admitting: Anesthesiology

## 2023-04-29 ENCOUNTER — Ambulatory Visit (HOSPITAL_BASED_OUTPATIENT_CLINIC_OR_DEPARTMENT_OTHER): Payer: Medicare Other | Admitting: Anesthesiology

## 2023-04-29 ENCOUNTER — Other Ambulatory Visit: Payer: Self-pay

## 2023-04-29 DIAGNOSIS — K8042 Calculus of bile duct with acute cholecystitis without obstruction: Secondary | ICD-10-CM | POA: Diagnosis not present

## 2023-04-29 DIAGNOSIS — K8034 Calculus of bile duct with chronic cholangitis without obstruction: Secondary | ICD-10-CM

## 2023-04-29 DIAGNOSIS — K8010 Calculus of gallbladder with chronic cholecystitis without obstruction: Secondary | ICD-10-CM | POA: Diagnosis not present

## 2023-04-29 DIAGNOSIS — I10 Essential (primary) hypertension: Secondary | ICD-10-CM | POA: Diagnosis not present

## 2023-04-29 DIAGNOSIS — K8033 Calculus of bile duct with acute cholangitis with obstruction: Secondary | ICD-10-CM | POA: Diagnosis not present

## 2023-04-29 DIAGNOSIS — Z85828 Personal history of other malignant neoplasm of skin: Secondary | ICD-10-CM | POA: Insufficient documentation

## 2023-04-29 DIAGNOSIS — K810 Acute cholecystitis: Secondary | ICD-10-CM

## 2023-04-29 DIAGNOSIS — K8012 Calculus of gallbladder with acute and chronic cholecystitis without obstruction: Principal | ICD-10-CM | POA: Insufficient documentation

## 2023-04-29 DIAGNOSIS — K8050 Calculus of bile duct without cholangitis or cholecystitis without obstruction: Secondary | ICD-10-CM | POA: Diagnosis present

## 2023-04-29 SURGERY — CHOLECYSTECTOMY, ROBOT-ASSISTED, LAPAROSCOPIC
Anesthesia: General | Site: Abdomen

## 2023-04-29 MED ORDER — SODIUM CHLORIDE 0.9 % IR SOLN
Status: DC | PRN
  Administered 2023-04-29: 3000 mL

## 2023-04-29 MED ORDER — DEXAMETHASONE SODIUM PHOSPHATE 10 MG/ML IJ SOLN
INTRAMUSCULAR | Status: AC
  Filled 2023-04-29: qty 1

## 2023-04-29 MED ORDER — PROPOFOL 10 MG/ML IV BOLUS
INTRAVENOUS | Status: AC
  Filled 2023-04-29: qty 20

## 2023-04-29 MED ORDER — ONDANSETRON HCL 4 MG/2ML IJ SOLN
INTRAMUSCULAR | Status: DC | PRN
  Administered 2023-04-29: 4 mg via INTRAVENOUS

## 2023-04-29 MED ORDER — ONDANSETRON HCL 4 MG/2ML IJ SOLN
INTRAMUSCULAR | Status: AC
  Filled 2023-04-29: qty 2

## 2023-04-29 MED ORDER — PREDNISOLONE ACETATE 1 % OP SUSP
1.0000 [drp] | Freq: Every day | OPHTHALMIC | Status: DC
  Administered 2023-04-30: 1 [drp] via OPHTHALMIC
  Filled 2023-04-29: qty 1

## 2023-04-29 MED ORDER — OXYCODONE HCL 5 MG/5ML PO SOLN
5.0000 mg | Freq: Once | ORAL | Status: AC | PRN
Start: 2023-04-29 — End: 2023-04-29

## 2023-04-29 MED ORDER — INDOCYANINE GREEN 25 MG IV SOLR: 2.5000 mg | Freq: Once | INTRAVENOUS | Status: AC

## 2023-04-29 MED ORDER — ONDANSETRON 4 MG PO TBDP: 4.0000 mg | ORAL_TABLET | Freq: Four times a day (QID) | ORAL | Status: DC | PRN

## 2023-04-29 MED ORDER — PHENYLEPHRINE 80 MCG/ML (10ML) SYRINGE FOR IV PUSH (FOR BLOOD PRESSURE SUPPORT)
PREFILLED_SYRINGE | INTRAVENOUS | Status: DC | PRN
  Administered 2023-04-29: 80 ug via INTRAVENOUS

## 2023-04-29 MED ORDER — OXYCODONE HCL 5 MG PO TABS
ORAL_TABLET | ORAL | Status: AC
  Filled 2023-04-29: qty 1

## 2023-04-29 MED ORDER — FENTANYL CITRATE PF 50 MCG/ML IJ SOSY: 25.0000 ug | PREFILLED_SYRINGE | INTRAMUSCULAR | Status: DC | PRN

## 2023-04-29 MED ORDER — STERILE WATER FOR IRRIGATION IR SOLN
Status: DC | PRN
  Administered 2023-04-29: 1000 mL

## 2023-04-29 MED ORDER — PHENYLEPHRINE 80 MCG/ML (10ML) SYRINGE FOR IV PUSH (FOR BLOOD PRESSURE SUPPORT)
PREFILLED_SYRINGE | INTRAVENOUS | Status: AC
Start: 2023-04-29 — End: ?
  Filled 2023-04-29: qty 10

## 2023-04-29 MED ORDER — LIDOCAINE HCL (CARDIAC) PF 100 MG/5ML IV SOSY
PREFILLED_SYRINGE | INTRAVENOUS | Status: DC | PRN
  Administered 2023-04-29: 60 mg via INTRAVENOUS

## 2023-04-29 MED ORDER — ACETAMINOPHEN 325 MG PO TABS
650.0000 mg | ORAL_TABLET | Freq: Four times a day (QID) | ORAL | Status: DC | PRN
  Administered 2023-04-29: 650 mg via ORAL
  Filled 2023-04-29: qty 2

## 2023-04-29 MED ORDER — SODIUM CHLORIDE 0.9 % IV SOLN
INTRAVENOUS | Status: AC
  Filled 2023-04-29: qty 2

## 2023-04-29 MED ORDER — DIPHENHYDRAMINE HCL 50 MG/ML IJ SOLN: 12.5000 mg | Freq: Four times a day (QID) | INTRAMUSCULAR | Status: DC | PRN

## 2023-04-29 MED ORDER — PANTOPRAZOLE SODIUM 40 MG PO TBEC
40.0000 mg | DELAYED_RELEASE_TABLET | Freq: Every day | ORAL | Status: DC
  Administered 2023-04-30: 40 mg via ORAL
  Filled 2023-04-29 (×3): qty 1

## 2023-04-29 MED ORDER — ONDANSETRON HCL 4 MG/2ML IJ SOLN
INTRAMUSCULAR | Status: AC
Start: 2023-04-29 — End: ?
  Filled 2023-04-29: qty 2

## 2023-04-29 MED ORDER — MORPHINE SULFATE (PF) 2 MG/ML IV SOLN
2.0000 mg | INTRAVENOUS | Status: DC | PRN
  Administered 2023-04-29: 2 mg via INTRAVENOUS
  Filled 2023-04-29: qty 1

## 2023-04-29 MED ORDER — SIMETHICONE 80 MG PO CHEW: 40.0000 mg | CHEWABLE_TABLET | Freq: Four times a day (QID) | ORAL | Status: DC | PRN

## 2023-04-29 MED ORDER — OXYCODONE HCL 5 MG PO TABS
5.0000 mg | ORAL_TABLET | ORAL | Status: DC | PRN
  Administered 2023-04-30: 5 mg via ORAL
  Filled 2023-04-29: qty 1

## 2023-04-29 MED ORDER — ORAL CARE MOUTH RINSE: 15.0000 mL | Freq: Once | OROMUCOSAL | Status: AC

## 2023-04-29 MED ORDER — ONDANSETRON HCL 4 MG/2ML IJ SOLN: 4.0000 mg | Freq: Four times a day (QID) | INTRAMUSCULAR | Status: DC | PRN

## 2023-04-29 MED ORDER — PROPOFOL 10 MG/ML IV BOLUS
INTRAVENOUS | Status: DC | PRN
  Administered 2023-04-29: 120 mg via INTRAVENOUS

## 2023-04-29 MED ORDER — FENTANYL CITRATE (PF) 100 MCG/2ML IJ SOLN
INTRAMUSCULAR | Status: AC
  Filled 2023-04-29: qty 2

## 2023-04-29 MED ORDER — DIPHENHYDRAMINE HCL 12.5 MG/5ML PO ELIX: 12.5000 mg | ORAL_SOLUTION | Freq: Four times a day (QID) | ORAL | Status: DC | PRN

## 2023-04-29 MED ORDER — DEXAMETHASONE SODIUM PHOSPHATE 10 MG/ML IJ SOLN
INTRAMUSCULAR | Status: DC | PRN
  Administered 2023-04-29: 5 mg via INTRAVENOUS

## 2023-04-29 MED ORDER — FENTANYL CITRATE (PF) 100 MCG/2ML IJ SOLN
INTRAMUSCULAR | Status: DC | PRN
  Administered 2023-04-29 (×4): 50 ug via INTRAVENOUS

## 2023-04-29 MED ORDER — ACETAMINOPHEN 650 MG RE SUPP: 650.0000 mg | Freq: Four times a day (QID) | RECTAL | Status: DC | PRN

## 2023-04-29 MED ORDER — BUPIVACAINE HCL (PF) 0.5 % IJ SOLN
INTRAMUSCULAR | Status: DC | PRN
  Administered 2023-04-29: 30 mL

## 2023-04-29 MED ORDER — CHLORHEXIDINE GLUCONATE 0.12 % MT SOLN: 15.0000 mL | Freq: Once | OROMUCOSAL | Status: AC

## 2023-04-29 MED ORDER — LIDOCAINE HCL (PF) 2 % IJ SOLN
INTRAMUSCULAR | Status: AC
  Filled 2023-04-29: qty 5

## 2023-04-29 MED ORDER — SUGAMMADEX SODIUM 200 MG/2ML IV SOLN
INTRAVENOUS | Status: DC | PRN
  Administered 2023-04-29: 200 mg via INTRAVENOUS

## 2023-04-29 MED ORDER — CHLORHEXIDINE GLUCONATE CLOTH 2 % EX PADS
6.0000 | MEDICATED_PAD | Freq: Once | CUTANEOUS | Status: AC
  Administered 2023-04-29: 6 via TOPICAL

## 2023-04-29 MED ORDER — LEVOTHYROXINE SODIUM 125 MCG PO TABS
125.0000 ug | ORAL_TABLET | Freq: Every day | ORAL | Status: DC
Start: 2023-04-30 — End: 2023-04-30
  Administered 2023-04-30: 125 ug via ORAL
  Filled 2023-04-29 (×3): qty 1

## 2023-04-29 MED ORDER — CHLORHEXIDINE GLUCONATE CLOTH 2 % EX PADS: 6.0000 | MEDICATED_PAD | Freq: Once | CUTANEOUS | Status: DC

## 2023-04-29 MED ORDER — ONDANSETRON HCL 4 MG/2ML IJ SOLN: 4.0000 mg | Freq: Once | INTRAMUSCULAR | Status: DC | PRN

## 2023-04-29 MED ORDER — ROCURONIUM BROMIDE 10 MG/ML (PF) SYRINGE
PREFILLED_SYRINGE | INTRAVENOUS | Status: DC | PRN
  Administered 2023-04-29: 20 mg via INTRAVENOUS
  Administered 2023-04-29: 50 mg via INTRAVENOUS

## 2023-04-29 MED ORDER — VANCOMYCIN HCL 125 MG PO CAPS
125.0000 mg | ORAL_CAPSULE | Freq: Four times a day (QID) | ORAL | Status: DC
  Administered 2023-04-29 – 2023-04-30 (×3): 125 mg via ORAL
  Filled 2023-04-29 (×3): qty 1

## 2023-04-29 MED ORDER — HYDROMORPHONE HCL 1 MG/ML IJ SOLN
INTRAMUSCULAR | Status: AC
  Filled 2023-04-29: qty 1

## 2023-04-29 MED ORDER — LACTATED RINGERS IV SOLN: INTRAVENOUS | Status: DC

## 2023-04-29 MED ORDER — SODIUM CHLORIDE 0.9 % IV SOLN
2.0000 g | INTRAVENOUS | Status: AC
  Administered 2023-04-29: 2 g via INTRAVENOUS

## 2023-04-29 MED ORDER — BUPIVACAINE HCL (PF) 0.5 % IJ SOLN
INTRAMUSCULAR | Status: AC
  Filled 2023-04-29: qty 30

## 2023-04-29 MED ORDER — METOPROLOL TARTRATE 5 MG/5ML IV SOLN: 5.0000 mg | Freq: Four times a day (QID) | INTRAVENOUS | Status: DC | PRN

## 2023-04-29 MED ORDER — ROCURONIUM BROMIDE 10 MG/ML (PF) SYRINGE
PREFILLED_SYRINGE | INTRAVENOUS | Status: AC
  Filled 2023-04-29: qty 10

## 2023-04-29 MED ORDER — ALPRAZOLAM 0.5 MG PO TABS
0.5000 mg | ORAL_TABLET | Freq: Three times a day (TID) | ORAL | Status: DC
Start: 2023-04-29 — End: 2023-04-30
  Administered 2023-04-29 – 2023-04-30 (×2): 0.5 mg via ORAL
  Filled 2023-04-29 (×7): qty 1

## 2023-04-29 MED ORDER — INDOCYANINE GREEN 25 MG IV SOLR
INTRAVENOUS | Status: AC
  Administered 2023-04-29: 2.5 mg via INTRAVENOUS
  Filled 2023-04-29: qty 10

## 2023-04-29 MED ORDER — CHLORHEXIDINE GLUCONATE 0.12 % MT SOLN
OROMUCOSAL | Status: AC
  Administered 2023-04-29: 15 mL via OROMUCOSAL
  Filled 2023-04-29: qty 15

## 2023-04-29 MED ORDER — OXYCODONE HCL 5 MG PO TABS
5.0000 mg | ORAL_TABLET | Freq: Once | ORAL | Status: AC | PRN
  Administered 2023-04-29: 5 mg via ORAL

## 2023-04-29 SURGICAL SUPPLY — 42 items
BLADE SURG 15 STRL LF DISP TIS (BLADE) ×1 IMPLANT
CAUTERY HOOK MNPLR 1.6 DVNC XI (INSTRUMENTS) ×1 IMPLANT
CHLORAPREP W/TINT 26 (MISCELLANEOUS) ×1 IMPLANT
CLIP LIGATING HEM O LOK PURPLE (MISCELLANEOUS) ×1 IMPLANT
COVER LIGHT HANDLE STERIS (MISCELLANEOUS) ×2 IMPLANT
DERMABOND ADVANCED .7 DNX12 (GAUZE/BANDAGES/DRESSINGS) ×1 IMPLANT
DRAPE ARM DVNC X/XI (DISPOSABLE) ×4 IMPLANT
DRAPE COLUMN DVNC XI (DISPOSABLE) ×1 IMPLANT
DRAPE HALF SHEET 40X57 (DRAPES) IMPLANT
ELECT REM PT RETURN 9FT ADLT (ELECTROSURGICAL) ×1
ELECTRODE REM PT RTRN 9FT ADLT (ELECTROSURGICAL) ×1 IMPLANT
FORCEPS BPLR R/ABLATION 8 DVNC (INSTRUMENTS) ×1 IMPLANT
FORCEPS PROGRASP DVNC XI (FORCEP) ×1 IMPLANT
GLOVE BIO SURGEON STRL SZ 6.5 (GLOVE) ×2 IMPLANT
GLOVE BIOGEL PI IND STRL 6.5 (GLOVE) ×2 IMPLANT
GLOVE BIOGEL PI IND STRL 7.0 (GLOVE) ×2 IMPLANT
GLOVE SURG SS PI 6.5 STRL IVOR (GLOVE) IMPLANT
GOWN STRL REUS W/TWL LRG LVL3 (GOWN DISPOSABLE) ×4 IMPLANT
GRASPER SUT TROCAR 14GX15 (MISCELLANEOUS) IMPLANT
IRRIGATOR SUCT 8 DISP DVNC XI (IRRIGATION / IRRIGATOR) IMPLANT
IV NS IRRIG 3000ML ARTHROMATIC (IV SOLUTION) ×1 IMPLANT
KIT TURNOVER KIT A (KITS) ×1 IMPLANT
MANIFOLD NEPTUNE II (INSTRUMENTS) IMPLANT
NDL HYPO 21X1.5 SAFETY (NEEDLE) ×1 IMPLANT
NDL INSUFFLATION 14GA 120MM (NEEDLE) ×1 IMPLANT
NEEDLE HYPO 21X1.5 SAFETY (NEEDLE) ×1
NEEDLE INSUFFLATION 14GA 120MM (NEEDLE) ×1
OBTURATOR OPTICAL STND 8 DVNC (TROCAR) ×1
OBTURATOR OPTICALSTD 8 DVNC (TROCAR) ×1 IMPLANT
PACK LAP CHOLE LZT030E (CUSTOM PROCEDURE TRAY) ×1 IMPLANT
PAD ARMBOARD 7.5X6 YLW CONV (MISCELLANEOUS) ×1 IMPLANT
PENCIL HANDSWITCHING (ELECTRODE) ×1 IMPLANT
POSITIONER HEAD 8X9X4 ADT (SOFTGOODS) ×1 IMPLANT
SEAL UNIV 5-12 XI (MISCELLANEOUS) ×4 IMPLANT
SET BASIN LINEN APH (SET/KITS/TRAYS/PACK) ×1 IMPLANT
SET TUBE DA VINCI INSUFFLATOR (TUBING) IMPLANT
SUT MNCRL AB 4-0 PS2 18 (SUTURE) ×2 IMPLANT
SUT VICRYL 0 AB UR-6 (SUTURE) IMPLANT
SYR 30ML LL (SYRINGE) ×1 IMPLANT
SYS RETRIEVAL 5MM INZII UNIV (BASKET) ×1
SYSTEM RETRIEVL 5MM INZII UNIV (BASKET) ×1 IMPLANT
WATER STERILE IRR 500ML POUR (IV SOLUTION) ×1 IMPLANT

## 2023-04-29 NOTE — Anesthesia Preprocedure Evaluation (Signed)
Anesthesia Evaluation  Patient identified by MRN, date of birth, ID band Patient awake    Reviewed: Allergy & Precautions, H&P , NPO status , Patient's Chart, lab work & pertinent test results, reviewed documented beta blocker date and time   Airway Mallampati: II  TM Distance: >3 FB Neck ROM: full    Dental no notable dental hx.    Pulmonary neg pulmonary ROS   Pulmonary exam normal breath sounds clear to auscultation       Cardiovascular Exercise Tolerance: Good hypertension, + Past MI  negative cardio ROS  Rhythm:regular Rate:Normal     Neuro/Psych   Anxiety     negative neurological ROS  negative psych ROS   GI/Hepatic negative GI ROS, Neg liver ROS, PUD,,,  Endo/Other  negative endocrine ROS    Renal/GU negative Renal ROS  negative genitourinary   Musculoskeletal   Abdominal   Peds  Hematology negative hematology ROS (+)   Anesthesia Other Findings   Reproductive/Obstetrics negative OB ROS                             Anesthesia Physical Anesthesia Plan  ASA: 2  Anesthesia Plan: General and General ETT   Post-op Pain Management:    Induction:   PONV Risk Score and Plan: Ondansetron  Airway Management Planned:   Additional Equipment:   Intra-op Plan:   Post-operative Plan:   Informed Consent: I have reviewed the patients History and Physical, chart, labs and discussed the procedure including the risks, benefits and alternatives for the proposed anesthesia with the patient or authorized representative who has indicated his/her understanding and acceptance.     Dental Advisory Given  Plan Discussed with: CRNA  Anesthesia Plan Comments:        Anesthesia Quick Evaluation

## 2023-04-29 NOTE — Op Note (Signed)
[facility] Operative Note  04/29/23  Preoperative Diagnosis: Recent choledocholithiasis with cholangitis admission    Postoperative Diagnosis: Recent choledocholithiasis with cholangitis admission, acute cholecystitis    Procedure(s) Performed: Robotic Assisted Laparoscopic Cholecystectomy   Surgeon: Lillia Abed C. Henreitta Leber, MD   Assistants: No qualified resident was available    Anesthesia: General endotracheal   Anesthesiologist: Dr. Karmen Bongo MD    Specimens: Gallbladder   Estimated Blood Loss: Minimal   Blood Replacement: None    Complications: None   Wound Class: Contaminated    Operative Indications: The patient was found to have stones and a recent admission for choledocolithiasis and cholangitis s/p ERCP.  We discussed the risk of the procedure including but not limited to bleeding, infection, injury to the common bile duct, bile leak, need for further procedures, chance of subtotal cholecystectomy.   Findings:  Critical view of safety noted All clips intact at the end of the case Adequate hemostasis Acute inflammation of the gallbladder with edema, consistent with cholecystitis    Procedure: Firefly was given in the preoperative area. The patient was taken to the operating room and placed supine. General endotracheal anesthesia was induced. Intravenous antibiotics were  administered per protocol.  An orogastric tube positioned to decompress the stomach. The abdomen was prepared and draped in the usual sterile fashion.   Veress needle was placed at the supraumbilical area and insufflation was started after confirming a positive saline drop test and no immediate increase in abdominal pressure.  After reaching 15 mm, the Veress needle was removed and a 8 mm port was placed via optiview technique supraumbilical, measuring 20 mm away from the suspected position of the gallbladder.  The abdomen was inspected and no abnormalities or injuries were found.  Under  direct vision, ports were placed in the following locations in a semi curvilinear position around the target of the gallbladder: Two 8 mm ports on the patient's right each having 8cm clearance to the adjacent ports and one 8 mm port placed on the patient's left 8 cm from the umbilical port. Once ports were placed, the table was placed in the reverse Trendelenburg position with the right side up. The Xi platform was brought into the operative field and docked to the ports successfully.  An endoscope was placed through the umbilical port, prograsper through the most lateral right port, forced bipolar to the port just right of the umbilicus, and then a hook cautery in the left port.   The dome of the gallbladder was grasped with prograsp and retracted over the dome of the liver. Adhesions between the gallbladder and omentum, duodenum and transverse colon were lysed via hook cautery. The infundibulum was grasped with the forced bipolar and retracted toward the right lower quadrant. This maneuver exposed Calot's triangle. Firefly was used throughout the dissection to ensure safe visualization of the cystic duct.The peritoneum overlying the gallbladder infundibulum was then dissected and the cystic duct and cystic artery identified.  Suction irrigation was used to clear the area as needed. Critical view of safety with the liver bed clearly visible behind the duct and artery with no additional structures noted.  The cystic duct and cystic artery were doubly clipped and divided close to the gallbladder.    The gallbladder was then dissected from its peritoneal and liver bed attachments by electrocautery. There was some minor spillage of stones and bile. This was suctioned up removing the stones and irrigation was performed. Hemostasis was checked prior to removing the hook cautery.  A  5mm Endo Catch bag was then placed through the left side port. The [employer] was undocked and moved out of the field,  and the gallbladder was  removed in the bag.  The gallbladder was passed off the table as a specimen. There was no evidence of bleeding from the gallbladder fossa or cystic artery or leakage of the bile from the cystic duct stump. The left port site closed with a 0 vicryl and PMI due to dilation from removing the gallbladder.The abdomen was desufflated and secondary trocars were removed under direct vision.   No bleeding was noted. All skin incisions were closed with subcuticular sutures of 4-0 monocryl and dermabond.   Final inspection revealed acceptable hemostasis. All counts were correct at the end of the case. The patient was awakened from anesthesia and extubated without complication. The OG tube was removed.  The patient went to the PACU in stable condition.   Algis Greenhouse, MD [facility] [address] [place] [place] [place], [place] [address] [phone] (office)

## 2023-04-29 NOTE — Plan of Care (Signed)
  Problem: Clinical Measurements: Goal: Ability to maintain clinical measurements within normal limits will improve Outcome: Progressing   Problem: Activity: Goal: Risk for activity intolerance will decrease Outcome: Progressing   Problem: Nutrition: Goal: Adequate nutrition will be maintained Outcome: Progressing   Problem: Elimination: Goal: Will not experience complications related to bowel motility Outcome: Progressing Goal: Will not experience complications related to urinary retention Outcome: Progressing   Problem: Pain Management: Goal: General experience of comfort will improve Outcome: Progressing   Problem: Safety: Goal: Ability to remain free from injury will improve Outcome: Progressing   Problem: Skin Integrity: Goal: Risk for impaired skin integrity will decrease Outcome: Progressing

## 2023-04-29 NOTE — Interval H&P Note (Signed)
History and Physical Interval Note:  04/29/2023 1:33 PM  Elizabeth Norman  has presented today for surgery, with the diagnosis of CHOLANGITIS.  The various methods of treatment have been discussed with the patient and family. After consideration of risks, benefits and other options for treatment, the patient has consented to  Procedure(s): XI ROBOTIC ASSISTED LAPAROSCOPIC CHOLECYSTECTOMY (N/A) as a surgical intervention.  The patient's history has been reviewed, patient examined, no change in status, stable for surgery.  I have reviewed the patient's chart and labs.  Questions were answered to the patient's satisfaction.    Will keep overnight for monitoring given age and late hour for OR.   Lucretia Roers

## 2023-04-29 NOTE — TOC CM/SW Note (Signed)
Transition of Care [facility]) - Inpatient Brief Assessment   Patient Details  Name: Elizabeth Norman MRN: [id number] Date of Birth: [birth year]  Transition of Care [facility]) CM/SW Contact:    Villa Herb, LCSWA Phone Number: 04/29/2023, 3:40 PM   Clinical Narrative: Transition of Care Department [facility]) has reviewed patient and no TOC needs have been identified at this time. We will continue to monitor patient advancement through interdisciplinary progression rounds. If new patient transition needs arise, please place a TOC consult.  Transition of Care Asessment: Insurance and Status: Insurance coverage has been reviewed Patient has primary care physician: Yes Home environment has been reviewed: From home Prior level of function:: Independent Prior/Current Home Services: No current home services Social Drivers of Health Review: SDOH reviewed no interventions necessary Readmission risk has been reviewed: Yes Transition of care needs: no transition of care needs at this time

## 2023-04-29 NOTE — Anesthesia Procedure Notes (Signed)
Procedure Name: Intubation Date/Time: 04/29/2023 2:11 PM  Performed by: Oletha Cruel, CRNAPre-anesthesia Checklist: Patient identified, Emergency Drugs available, Suction available and Patient being monitored Patient Re-evaluated:Patient Re-evaluated prior to induction Oxygen Delivery Method: Circle system utilized Preoxygenation: Pre-oxygenation with 100% oxygen Induction Type: IV induction Ventilation: Mask ventilation without difficulty Laryngoscope Size: Mac and 4 Grade View: Grade II Tube type: Oral Tube size: 7.0 mm Number of attempts: 1 Airway Equipment and Method: Stylet Placement Confirmation: ETT inserted through vocal cords under direct vision, positive ETCO2, CO2 detector and breath sounds checked- equal and bilateral Secured at: 22 cm Tube secured with: Tape Dental Injury: Teeth and Oropharynx as per pre-operative assessment  Comments: Atraumatic intubation. Lips and teeth remain in preoperative condition.

## 2023-04-29 NOTE — Progress Notes (Signed)
[facility]  Updated family.  Patient observation overnight. 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 Symptoms improving but still on Vancomycin 125 mg QID course. Placed on contact precautions here and her vancomycin dosing. Diet as tolerated PRN for pain Home tomorrow Labs in AM  Algis Greenhouse, MD [facility] [address] [place] [place], [place] [address] [phone] (office)

## 2023-04-29 NOTE — Transfer of Care (Signed)
Immediate Anesthesia Transfer of Care Note  Patient: Elizabeth Norman  Procedure(s) Performed: XI ROBOTIC ASSISTED LAPAROSCOPIC CHOLECYSTECTOMY (Abdomen)  Patient Location: PACU  Anesthesia Type:General  Level of Consciousness: awake  Airway & Oxygen Therapy: Patient Spontanous Breathing and Patient connected to nasal cannula oxygen  Post-op Assessment: Report given to RN and Post -op Vital signs reviewed and stable  Post vital signs: Reviewed and stable  Last Vitals:  Vitals Value Taken Time  BP 129/86   Temp 97.8   Pulse 105   Resp 23   SpO2 100%     Last Pain:  Vitals:   04/29/23 1103  TempSrc: Oral  PainSc: 0-No pain         Complications: No notable events documented.

## 2023-04-30 DIAGNOSIS — Z85828 Personal history of other malignant neoplasm of skin: Secondary | ICD-10-CM | POA: Diagnosis not present

## 2023-04-30 DIAGNOSIS — K8012 Calculus of gallbladder with acute and chronic cholecystitis without obstruction: Secondary | ICD-10-CM | POA: Diagnosis not present

## 2023-04-30 DIAGNOSIS — I10 Essential (primary) hypertension: Secondary | ICD-10-CM | POA: Diagnosis not present

## 2023-04-30 LAB — COMPREHENSIVE METABOLIC PANEL
ALT: 18 U/L (ref 0–44)
AST: 48 U/L — ABNORMAL HIGH (ref 15–41)
Albumin: 2.3 g/dL — ABNORMAL LOW (ref 3.5–5.0)
Alkaline Phosphatase: 91 U/L (ref 38–126)
Anion gap: 7 (ref 5–15)
BUN: 10 mg/dL (ref 8–23)
CO2: 29 mmol/L (ref 22–32)
Calcium: 8.2 mg/dL — ABNORMAL LOW (ref 8.9–10.3)
Chloride: 99 mmol/L (ref 98–111)
Creatinine, Ser: 0.77 mg/dL (ref 0.44–1.00)
GFR, Estimated: >60 mL/min (ref >60)
Glucose, Bld: 166 mg/dL — ABNORMAL HIGH (ref 70–99)
Potassium: 4.4 mmol/L (ref 3.5–5.1)
Sodium: 135 mmol/L (ref 135–145)
Total Bilirubin: 0.6 mg/dL (ref <1.2)
Total Protein: 5.5 g/dL — ABNORMAL LOW (ref 6.5–8.1)

## 2023-04-30 LAB — CBC
HCT: 37 % (ref 36.0–46.0)
Hemoglobin: 11.2 g/dL — ABNORMAL LOW (ref 12.0–15.0)
MCH: 26.9 pg (ref 26.0–34.0)
MCHC: 30.3 g/dL (ref 30.0–36.0)
MCV: 88.9 fL (ref 80.0–100.0)
Platelets: 188 10*3/uL (ref 150–400)
RBC: 4.16 MIL/uL (ref 3.87–5.11)
RDW: 14.6 % (ref 11.5–15.5)
WBC: 11 10*3/uL — ABNORMAL HIGH (ref 4.0–10.5)
nRBC: 0 % (ref 0.0–0.2)

## 2023-04-30 LAB — MAGNESIUM: Magnesium: 1.8 mg/dL (ref 1.7–2.4)

## 2023-04-30 LAB — PHOSPHORUS: Phosphorus: 4 mg/dL (ref 2.5–4.6)

## 2023-04-30 MED ORDER — OXYCODONE HCL 5 MG PO TABS: 5.0000 mg | ORAL_TABLET | ORAL | 0 refills | Status: DC | PRN

## 2023-04-30 MED ORDER — ONDANSETRON 4 MG PO TBDP: 4.0000 mg | ORAL_TABLET | Freq: Four times a day (QID) | ORAL | 0 refills | Status: AC | PRN

## 2023-04-30 NOTE — Progress Notes (Signed)
Nsg Discharge Note  Admit Date:  04/29/2023 Discharge date: 04/30/2023   Elizabeth Norman to be D/C'd Home per MD order.  AVS completed.  Copy for chart, and copy for patient signed, and dated. Patient/caregiver able to verbalize understanding.  Discharge Medication: Allergies as of 04/30/2023       Reactions   Tape    Pulls skin, please use paper tape        Medication List     TAKE these medications    ALPRAZolam 0.5 MG tablet Commonly known as: XANAX Take 0.5 mg by mouth 3 (three) times daily.   calcium carbonate 1500 (600 Ca) MG Tabs tablet Commonly known as: OSCAL Take 600 mg of elemental calcium by mouth 3 (three) times a week.   cholecalciferol 1000 units tablet Commonly known as: VITAMIN D Take 1,000 Units by mouth 3 (three) times a week.   famotidine 40 MG tablet Commonly known as: PEPCID Take 40 mg by mouth daily.   levothyroxine 125 MCG tablet Commonly known as: SYNTHROID Take 1 tablet (125 mcg total) by mouth daily before breakfast.   metoprolol tartrate 25 MG tablet Commonly known as: LOPRESSOR Take 1 tablet (25 mg total) by mouth 2 (two) times daily. Hold until follow-up with your doctor/cardiologist   omeprazole 40 MG capsule Commonly known as: PRILOSEC Take 40 mg by mouth daily.   ondansetron 4 MG disintegrating tablet Commonly known as: ZOFRAN-ODT Take 1 tablet (4 mg total) by mouth every 6 (six) hours as needed for nausea.   oxyCODONE 5 MG immediate release tablet Commonly known as: Oxy IR/ROXICODONE Take 1 tablet (5 mg total) by mouth every 4 (four) hours as needed for severe pain (pain score 7-10) or breakthrough pain.   prednisoLONE acetate 1 % ophthalmic suspension Commonly known as: PRED FORTE Place 1 drop into both eyes daily.   vancomycin 125 MG capsule Commonly known as: VANCOCIN Take 1 capsule (125 mg total) by mouth 4 (four) times daily for 10 days.   VITAMIN B-12 PO Take 1 tablet by mouth once a week.        Discharge  Assessment: Vitals:   04/30/23 0008 04/30/23 0538  BP: (!) 112/59 118/61  Pulse: 96 81  Resp: 16 18  Temp: 98.1 F (36.7 C) 98.1 F (36.7 C)  SpO2: 95% 95%   Skin clean, dry and intact without evidence of skin break down, no evidence of skin tears noted. IV catheter discontinued intact. Site without signs and symptoms of complications - no redness or edema noted at insertion site, patient denies c/o pain - only slight tenderness at site.  Dressing with slight pressure applied.  D/c Instructions-Education: Discharge instructions given to patient/family with verbalized understanding. D/c education completed with patient/family including follow up instructions, medication list, d/c activities limitations if indicated, with other d/c instructions as indicated by MD - patient able to verbalize understanding, all questions fully answered. Patient instructed to return to ED, call 911, or call MD for any changes in condition.  Patient escorted via WC, and D/C home via private auto.  Demetrio Lapping, LPN [id number]/4034 74:25 AM

## 2023-04-30 NOTE — Discharge Instructions (Signed)
Discharge Robotic Assisted Laparoscopic Surgery Instructions:  Continue your vancomycin and complete the entire course for your C dif infection. Continue to hold whatever medications you were holding for your cardiologist as instructed by them.  Common Complaints: Right shoulder pain is common after laparoscopic surgery.  This is secondary to the gas used in the surgery being trapped under the diaphragm.  Walk to help your body absorb the gas. This will improve in a few days. Pain at the port sites are common, especially the larger port sites. This will improve with time.  Some nausea is common and poor appetite. The main goal is to stay hydrated the first few days after surgery.   Diet/ Activity: Diet as tolerated. You may not have an appetite, but it is important to stay hydrated.  Drink 64 ounces of water a day. Your appetite will return with time.  Shower per your regular routine daily.  Do not take hot showers. Take warm showers that are less than 10 minutes. Rest and listen to your body, but do not remain in bed all day.  Walk everyday for at least 15-20 minutes. Deep cough and move around every 1-2 hours in the first few days after surgery.  Do not lift > 10 lbs, perform excessive bending, pushing, pulling, squatting for 1-2 weeks after surgery.  Do not pick at the dermabond glue on your incision sites.  This glue film will remain in place for 1-2 weeks and will start to peel off.  Do not place lotions or balms on your incision unless instructed to specifically by Dr. Henreitta Leber.   Pain Expectations and Narcotics: -After surgery you will have pain associated with your incisions and this is normal. The pain is muscular and nerve pain, and will get better with time. -You are encouraged and expected to take non narcotic medications like tylenol and ibuprofen (when able) to treat pain as multiple modalities can aid with pain treatment. -Narcotics are only used when pain is severe or there is  breakthrough pain. -You are not expected to have a pain score of 0 after surgery, as we cannot prevent pain. A pain score of 3-4 that allows you to be functional, move, walk, and tolerate some activity is the goal. The pain will continue to improve over the days after surgery and is dependent on your surgery. -Due to [place] law, we are only able to give a certain amount of pain medication to treat post operative pain, and we only give additional narcotics on a patient by patient basis.  -For most laparoscopic surgery, studies have shown that the majority of patients only need 10-15 narcotic pills, and for open surgeries most patients only need 15-20.   -Having appropriate expectations of pain and knowledge of pain management with non narcotics is important as we do not want anyone to become addicted to narcotic pain medication.  -Using ice packs in the first 48 hours and heating pads after 48 hours, wearing an abdominal binder (when recommended), and using over the counter medications are all ways to help with pain management.   -Simple acts like meditation and mindfulness practices after surgery can also help with pain control and research has proven the benefit of these practices.  Medication: Take tylenol and ibuprofen as needed for pain control, alternating every 4-6 hours.  Example:  Tylenol 1000mg  @ 6am, 12noon, 6pm, (Do not exceed 4000mg  of tylenol a day). Ibuprofen 800mg  @ 9am, 3pm, 9pm, 3am (Do not exceed 3600mg  of ibuprofen a  day).  Take Roxicodone for breakthrough pain every 4 hours.  Take Colace for constipation related to narcotic pain medication. If you do not have a bowel movement in 2 days, take Miralax over the counter.  Drink plenty of water to also prevent constipation.   Contact Information: If you have questions or concerns, please call our office, [phone], Monday- Thursday 8AM-5PM and Friday 8AM-12Noon.  If it is after hours or on the weekend, please call [facility]  Main Number, [phone], [phone], and ask to speak to the surgeon on call for Dr. Henreitta Leber at [facility].

## 2023-04-30 NOTE — Discharge Summary (Signed)
Physician Discharge Summary  Patient ID: DEVANNA CRUMBLISS MRN: [id number] DOB/AGE: 1941/02/05 82 y.o.  Admit date: 04/29/2023 Discharge date: 04/30/2023  Admission Diagnoses: Choledocholithiasis, cholangitis   Discharge Diagnoses:  Principal Problem:   Calculus of bile duct with acute cholangitis with obstruction Active Problems:   Choledocholithiasis   Cholecystitis, acute   Discharged Condition: good  Hospital Course: Ms. Cundari is a 82 yo who had cholangitis / choledocholithiasis and ERCP a few weeks ago. She was sent to [place] for her cholecystectomy. She did well but with her co-morbidities, age and late hour of surgery we kept her overnight for observation for pain or any other issues. She did well overnight and has been tolerating some diet and having pain control with meds.   Consults: None  Significant Diagnostic Studies:   Latest Reference Range & Units 04/30/23 04:07  Sodium 135 - 145 mmol/L 135  Potassium 3.5 - 5.1 mmol/L 4.4  Chloride 98 - 111 mmol/L 99  CO2 22 - 32 mmol/L 29  Glucose 70 - 99 mg/dL 347 (H)  BUN 8 - 23 mg/dL 10  Creatinine 4.25 - 9.56 mg/dL 3.87  Calcium 8.9 - 56.4 mg/dL 8.2 (L)  Anion gap 5 - 15  7  Phosphorus 2.5 - 4.6 mg/dL 4.0  Magnesium 1.7 - 2.4 mg/dL 1.8  Alkaline Phosphatase 38 - 126 U/L 91  Albumin 3.5 - 5.0 g/dL 2.3 (L)  AST 15 - 41 U/L 48 (H)  ALT 0 - 44 U/L 18  Total Protein 6.5 - 8.1 g/dL 5.5 (L)  Total Bilirubin <1.2 mg/dL 0.6  GFR, Estimated >33 mL/min >60  (H): Data is abnormally high (L): Data is abnormally low  Latest Reference Range & Units 04/30/23 04:07  WBC 4.0 - 10.5 K/uL 11.0 (H)  RBC 3.87 - 5.11 MIL/uL 4.16  Hemoglobin 12.0 - 15.0 g/dL 29.5 (L)  HCT 18.8 - 41.6 % 37.0  MCV 80.0 - 100.0 fL 88.9  MCH 26.0 - 34.0 pg 26.9  MCHC 30.0 - 36.0 g/dL 60.6  RDW 30.1 - 60.1 % 14.6  Platelets 150 - 400 K/uL 188  nRBC 0.0 - 0.2 % 0.0  (H): Data is abnormally high (L): Data is abnormally low Treatments: surgery: Robotic assisted  laparoscopic cholecystectomy 04/29/2023  Discharge Exam: Blood pressure 118/61, pulse 81, temperature 98.1 F (36.7 C), temperature source Oral, resp. rate 18, height 5\' 2"  (1.575 m), weight 73.5 kg, SpO2 95%. General appearance: alert and no distress Resp: normal work of breathing GI: soft, appropriately tender, port sites with dermabond, no erythema or drainage, some bruising  Disposition: Discharge disposition: 01-Home or Self Care       Discharge Instructions     Call MD for:  difficulty breathing, headache or visual disturbances   Complete by: As directed    Call MD for:  extreme fatigue   Complete by: As directed    Call MD for:  persistant dizziness or light-headedness   Complete by: As directed    Call MD for:  persistant nausea and vomiting   Complete by: As directed    Call MD for:  redness, tenderness, or signs of infection (pain, swelling, redness, odor or green/yellow discharge around incision site)   Complete by: As directed    Call MD for:  severe uncontrolled pain   Complete by: As directed    Call MD for:  temperature >100.4   Complete by: As directed    Increase activity slowly   Complete by: As directed  Allergies as of 04/30/2023       Reactions   Tape    Pulls skin, please use paper tape        Medication List     TAKE these medications    ALPRAZolam 0.5 MG tablet Commonly known as: XANAX Take 0.5 mg by mouth 3 (three) times daily.   calcium carbonate 1500 (600 Ca) MG Tabs tablet Commonly known as: OSCAL Take 600 mg of elemental calcium by mouth 3 (three) times a week.   cholecalciferol 1000 units tablet Commonly known as: VITAMIN D Take 1,000 Units by mouth 3 (three) times a week.   famotidine 40 MG tablet Commonly known as: PEPCID Take 40 mg by mouth daily.   levothyroxine 125 MCG tablet Commonly known as: SYNTHROID Take 1 tablet (125 mcg total) by mouth daily before breakfast.   metoprolol tartrate 25 MG  tablet Commonly known as: LOPRESSOR Take 1 tablet (25 mg total) by mouth 2 (two) times daily. Hold until follow-up with your doctor/cardiologist   omeprazole 40 MG capsule Commonly known as: PRILOSEC Take 40 mg by mouth daily.   ondansetron 4 MG disintegrating tablet Commonly known as: ZOFRAN-ODT Take 1 tablet (4 mg total) by mouth every 6 (six) hours as needed for nausea.   oxyCODONE 5 MG immediate release tablet Commonly known as: Oxy IR/ROXICODONE Take 1 tablet (5 mg total) by mouth every 4 (four) hours as needed for severe pain (pain score 7-10) or breakthrough pain.   prednisoLONE acetate 1 % ophthalmic suspension Commonly known as: PRED FORTE Place 1 drop into both eyes daily.   vancomycin 125 MG capsule Commonly known as: VANCOCIN Take 1 capsule (125 mg total) by mouth 4 (four) times daily for 10 days.   VITAMIN B-12 PO Take 1 tablet by mouth once a week.        Follow-up Information     Lucretia Roers, MD Follow up on 05/14/2023.   Specialty: General Surgery Why: phone call, if you need to be seen in person call the office Contact information: [address] [place] [facility] [id number] [phone]                 Signed: Lucretia Roers 04/30/2023, 9:20 AM

## 2023-05-01 LAB — SURGICAL PATHOLOGY

## 2023-05-01 NOTE — Anesthesia Postprocedure Evaluation (Signed)
Anesthesia Post Note  Patient: Elizabeth Norman  Procedure(s) Performed: XI ROBOTIC ASSISTED LAPAROSCOPIC CHOLECYSTECTOMY (Abdomen)  Patient location during evaluation: Phase II Anesthesia Type: General Level of consciousness: awake Pain management: pain level controlled Vital Signs Assessment: post-procedure vital signs reviewed and stable Respiratory status: spontaneous breathing and respiratory function stable Cardiovascular status: blood pressure returned to baseline and stable Postop Assessment: no headache and no apparent nausea or vomiting Anesthetic complications: no Comments: Late entry   No notable events documented.   Last Vitals:  Vitals:   04/30/23 0008 04/30/23 0538  BP: (!) 112/59 118/61  Pulse: 96 81  Resp: 16 18  Temp: 36.7 C 36.7 C  SpO2: 95% 95%    Last Pain:  Vitals:   04/30/23 0925  TempSrc:   PainSc: 0-No pain                 Windell Norfolk

## 2023-05-06 NOTE — Progress Notes (Incomplete)
[facility] [address] [place] [address]    Clinic Day:  05/06/2023  Referring physician: [name], MD  Patient Care Team: [name], MD as PCP - General (Internal Medicine) [name], MD as Medical Oncologist (Medical Oncology) [name], RN as Oncology Nurse Navigator (Oncology)   ASSESSMENT & PLAN:   Assessment: 1. Malignant melanoma of right posterior leg (pT4 pN3 M1): - She noticed fleshy lesion 4 months ago on the right leg. - Her PMD did a biopsy which was consistent with melanoma. - Wide local excision and lymph node biopsy by Dr. Donell Beers on 02/12/2021. - Pathology margins free, nodular type, Breslow's thickness 9.52 mm, Clark level V, deep margins free, ulceration absent, satellitosis absent, mitotic index 2/millimeters squared, LVI negative.  Neurotropism absent.  Tumor infiltrating lymphocytes absent.  Tumor regression not noted.  3 out of 3 positive sentinel lymph nodes for melanoma. - PET CT scan on 03/22/2021 with 3 foci of intense radiotracer activity within the right lower extremity.  In the medial right upper thigh nodule just superficial to thigh musculature measuring 11 mm with SUV 7.6.  Intramedullary hypermetabolic activity within the shaft of the mid right femur with SUV 14.6.  Intense activity associated with condylar notch of the distal right femur, appears to localize to soft tissue rather than the bone.  More diffuse activity associated with the medial right calf related to excision biopsy.  There is a focus of activity adjacent to the lateral margin of the right iliac wing SUV 4.1.  No evidence of visceral metastatic disease in the chest, abdomen or pelvis. 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 - NGS testing-BRAF negative, positive for NRAS  pathogenic variant exon 3, TERT promoter.  MSI-stable.  MMR-proficient.  NTRK 1/2/3 fusion not detected.  KIT mutation negative.  PD-L1 negative. - Nivolumab-Relatlimab every 28 days started on 05/09/2021.  Held permanently after first dose due to myocarditis. - PET scan on 07/25/2021: Slight progression with no new sites of metastatic disease. - Pembrolizumab started on 09/03/2021.  XRT to the soft tissue nodule within the medial right thigh and midshaft right femoral lesion from 03/26/2022 through 04/09/2022. -We have discussed further options including adding a low-dose ipilimumab to pembrolizumab which she is already receiving without any side effects.  I have quoted response rates around 30% (J Clin Oncol. 2021 Aug 20;39(24):2647-2655. doi: 10.1200/JCO.21.00079).  We also discussed the possibility increased chance of immunotherapy related side effects with combination of PD-1 and anti-CTL A4 antibody. - We also discussed option of binimetinib as she has NRAS mutation on previous NGS testing.  However this option is also associated with significant side effects and overall response rate of around 20%. - We also discussed other options including adding lenvatinib to pembrolizumab, least favorable option due to lack of robust data.  We have discussed chemotherapy options with dacarbazine/temozolomide among others. - Pembrolizumab with low-dose ipilimumab started on 01/14/2023 - Guardant360 (12/2022): NRAS Q61R (?  Binimetinib), T p53 C17 6Y, TERT promoter SNV, TMB 20.8, MSI high not detected.   2. Social/family history: - She is seen with her son today.  She lives by herself at home.  She is independent of ADLs and IADLs.  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  She is non-smoker. -  Father had colon cancer.  Sister had breast cancer and colon cancer.  Maternal aunt had breast cancer.  Paternal uncle had colon cancer.    Plan: 1. Malignant melanoma of the  right posterior leg (pT4 pN3 M1), BRAF V600 negative: - She has received 2 doses of combination pembrolizumab and low-dose ipilimumab and 1 dose of single agent pembrolizumab.  Last treatment was on 03/11/2023. - She was hospitalized from 03/30/2023 through 04/03/2023 with E. coli bacteremia and acute cholangitis. - Reviewed labs today: Normal LFTs with almond 2.8.  Creatinine is normal.  CBC grossly normal with hemoglobin 10.5. - She may proceed with her treatment today.  I will plan on repeating PET scan after next treatment. - She will need cholecystectomy per Dr. Henreitta Leber.  I will reach out to her and let her know that we can hold next treatment until after her surgery.  2.  History of immunotherapy myocarditis: - Troponin level today is 16.  Will continue to monitor prior to each treatment.  3.  Right medial thigh pain: - Small subcutaneous nodule in the right upper medial thigh measuring about 3 cm sees slightly tender and stable.  4.  Hypothyroidism: - Continue Synthroid 137 mcg daily.  TSH is 1.9.    No orders of the defined types were placed in this encounter.     [name],acting as a Neurosurgeon for [name], MD.,have documented all relevant documentation on the behalf of [name], MD,as directed by  [name], MD while in the presence of [name], MD.  ***      Merrimac R Teague   12/25/20248:53 PM  CHIEF COMPLAINT:   Diagnosis: metastatic malignant melanoma, BRAF V600 negative    Cancer Staging  Malignant melanoma of lower leg, right (HCC) Staging form: Melanoma of the Skin, AJCC 8th Edition - Clinical stage from 03/27/2021: Stage IV (cT4a, cN3, cM1) - Unsigned    Prior Therapy: none  Current Therapy:  Pembrolizumab (200) q21d Keytruda    HISTORY OF PRESENT ILLNESS:   Oncology History  Malignant melanoma of lower leg, right (HCC)  03/27/2021 Initial Diagnosis   Malignant melanoma of lower leg, right (HCC)    05/09/2021 - 05/09/2021 Chemotherapy   Patient is on Treatment Plan : MELANOMA - Opdualag (nivolumab/relatlimab) q28d     09/03/2021 - 01/07/2022 Chemotherapy   Patient is on Treatment Plan : MELANOMA Pembrolizumab (200) q21d     09/03/2021 -  Chemotherapy   Patient is on Treatment Plan : MELANOMA Pembrolizumab (200) + Yervoy (1mg /kg) q21d        INTERVAL HISTORY:   Elizabeth Norman is a 82 y.o. female presenting to clinic today for follow up of metastatic malignant melanoma, BRAF V600 negative. She was last seen by me on 04/16/23.  Since her last visit, she underwent a cholecystectomy on 04/29/23 with Dr. Henreitta Leber.   Today, she states that she is doing well overall. Her appetite level is at ***%. Her energy level is at ***%.  PAST MEDICAL HISTORY:   Past Medical History: Past Medical History:  Diagnosis Date   Arthritis    Hypertension    Malignant melanoma of lower leg, right (HCC) 03/27/2021   Port-A-Cath in place 04/30/2021    Surgical History: Past Surgical History:  Procedure Laterality Date   BIOPSY  04/02/2023   Procedure: BIOPSY;  Surgeon: Meryl Dare, MD;  Location: [facility] ENDOSCOPY;  Service: Gastroenterology;;   CATARACT EXTRACTION W/PHACO Right 10/03/2014   Procedure: CATARACT EXTRACTION PHACO AND INTRAOCULAR LENS  PLACEMENT (IOC);  Surgeon: Jethro Bolus, MD;  Location: [facility] ORS;  Service: Ophthalmology;  Laterality: Right;  CDE:10.09   CATARACT EXTRACTION W/PHACO Left 10/10/2014   Procedure: CATARACT EXTRACTION PHACO AND INTRAOCULAR LENS PLACEMENT (IOC);  Surgeon: Jethro Bolus, MD;  Location: [facility] ORS;  Service: Ophthalmology;  Laterality: Left;  CDE:8.69   CORNEAL 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   CORNEAL TRANSPLANT Left 2020   DENTAL RESTORATION/EXTRACTION WITH X-RAY     ERCP N/A 04/02/2023   Procedure: ENDOSCOPIC RETROGRADE CHOLANGIOPANCREATOGRAPHY (ERCP);  Surgeon: Meryl Dare, MD;  Location: [facility] ENDOSCOPY;  Service: Gastroenterology;  Laterality: N/A;   IR IMAGING GUIDED PORT  INSERTION  04/05/2021   LEFT HEART CATH AND CORONARY ANGIOGRAPHY N/A 05/28/2021   Procedure: LEFT HEART CATH AND CORONARY ANGIOGRAPHY;  Surgeon: [name], MD;  Location: [facility] INVASIVE CV LAB;  Service: Cardiovascular;  Laterality: N/A;   MELANOMA EXCISION Right 02/12/2021   Procedure: WIDE LOCAL EXCISION RIGHT LOWER LEG MELANOMA , ADVANCEMENT FLAP CLOSURE DEFECT;  Surgeon: Almond Lint, MD;  Location: [facility] OR;  Service: General;  Laterality: Right;   REMOVAL OF STONES  04/02/2023   Procedure: REMOVAL OF STONES;  Surgeon: Meryl Dare, MD;  Location: [facility] ENDOSCOPY;  Service: Gastroenterology;;   Susa Day  04/02/2023   Procedure: Susa Day;  Surgeon: Meryl Dare, MD;  Location: [facility] ENDOSCOPY;  Service: Gastroenterology;;   SENTINEL NODE BIOPSY Right 02/12/2021   Procedure: SENTINEL NODE BIOPSY;  Surgeon: Almond Lint, MD;  Location: [facility] OR;  Service: General;  Laterality: Right;   SPHINCTEROTOMY  04/02/2023   Procedure: SPHINCTEROTOMY;  Surgeon: Meryl Dare, MD;  Location: [facility] ENDOSCOPY;  Service: Gastroenterology;;    Social History: Social History   Socioeconomic History   Marital status: Widowed    Spouse name: Not on file   Number of children: 3   Years of education: Not on file   Highest education level: Not on file  Occupational History   Not on file  Tobacco Use   Smoking status: Never   Smokeless tobacco: Never  Vaping Use   Vaping status: Never Used  Substance and Sexual Activity   Alcohol use: No   Drug use: No   Sexual activity: Not on file  Other Topics Concern   Not on file  Social History Narrative   Not on file   Social Drivers of Health   Financial Resource Strain: Not on file  Food Insecurity: No Food Insecurity (04/29/2023)   Hunger Vital Sign    Worried About Running Out of Food in the Last Year: Never true    Ran Out of Food in the Last Year: Never true  Transportation Needs: No Transportation Needs (04/29/2023)   PRAPARE -  Administrator, Civil Service (Medical): No    Lack of Transportation (Non-Medical): No  Physical Activity: Not on file  Stress: Not on file  Social Connections: Not on file  Intimate Partner Violence: Not At Risk (04/29/2023)   Humiliation, Afraid, Rape, and Kick questionnaire    Fear of Current or Ex-Partner: No    Emotionally Abused: No    Physically Abused: No    Sexually Abused: No    Family History: Family History  Problem Relation Age of Onset   Cancer Father    Hypertension Sister    Hypertension Sister     Current Medications:  Current Outpatient Medications:    ALPRAZolam (XANAX) 0.5 MG tablet, Take 0.5 mg by mouth 3 (three) times daily., Disp: , Rfl: 2  calcium carbonate (OSCAL) 1500 (600 Ca) MG TABS tablet, Take 600 mg of elemental calcium by mouth 3 (three) times a week., Disp: , Rfl:    cholecalciferol (VITAMIN D) 1000 UNITS tablet, Take 1,000 Units by mouth 3 (three) times a week., Disp: , Rfl:    Cyanocobalamin (VITAMIN B-12 PO), Take 1 tablet by mouth once a week., Disp: , Rfl:    famotidine (PEPCID) 40 MG tablet, Take 40 mg by mouth daily., Disp: , Rfl:    levothyroxine (SYNTHROID) 125 MCG tablet, Take 1 tablet (125 mcg total) by mouth daily before breakfast., Disp: 30 tablet, Rfl: 3   metoprolol tartrate (LOPRESSOR) 25 MG tablet, Take 1 tablet (25 mg total) by mouth 2 (two) times daily. Hold until follow-up with your doctor/cardiologist, Disp: , Rfl:    omeprazole (PRILOSEC) 40 MG capsule, Take 40 mg by mouth daily., Disp: , Rfl:    ondansetron (ZOFRAN-ODT) 4 MG disintegrating tablet, Take 1 tablet (4 mg total) by mouth every 6 (six) hours as needed for nausea., Disp: 20 tablet, Rfl: 0   oxyCODONE (OXY IR/ROXICODONE) 5 MG immediate release tablet, Take 1 tablet (5 mg total) by mouth every 4 (four) hours as needed for severe pain (pain score 7-10) or breakthrough pain., Disp: 8 tablet, Rfl: 0   prednisoLONE acetate (PRED FORTE) 1 % ophthalmic  suspension, Place 1 drop into both eyes daily., Disp: , Rfl:    Allergies: Allergies  Allergen Reactions   Tape     Pulls skin, please use paper tape    REVIEW OF SYSTEMS:   Review of Systems  Constitutional:  Negative for chills, fatigue and fever.  HENT:   Negative for lump/mass, mouth sores, nosebleeds, sore throat and trouble swallowing.   Eyes:  Negative for eye problems.  Respiratory:  Negative for cough and shortness of breath.   Cardiovascular:  Negative for chest pain, leg swelling and palpitations.  Gastrointestinal:  Negative for abdominal pain, constipation, diarrhea, nausea and vomiting.  Genitourinary:  Negative for bladder incontinence, difficulty urinating, dysuria, frequency, hematuria and nocturia.   Musculoskeletal:  Negative for arthralgias, back pain, flank pain, myalgias and neck pain.  Skin:  Negative for itching and rash.  Neurological:  Negative for dizziness, headaches and numbness.  Hematological:  Does not bruise/bleed easily.  Psychiatric/Behavioral:  Negative for depression, sleep disturbance and suicidal ideas. The patient is not nervous/anxious.   All other systems reviewed and are negative.    VITALS:   There were no vitals taken for this visit.  Wt Readings from Last 3 Encounters:  04/29/23 162 lb 0.6 oz (73.5 kg)  04/27/23 162 lb (73.5 kg)  04/23/23 162 lb (73.5 kg)    There is no height or weight on file to calculate BMI.  Performance status (ECOG): 1 - Symptomatic but completely ambulatory  PHYSICAL EXAM:   Physical Exam Vitals and nursing note reviewed. Exam conducted with a chaperone present.  Constitutional:      Appearance: Normal appearance.  Cardiovascular:     Rate and Rhythm: Normal rate and regular rhythm.     Pulses: Normal pulses.     Heart sounds: Normal heart sounds.  Pulmonary:     Effort: Pulmonary effort is normal.     Breath sounds: Normal breath sounds.  Abdominal:     Palpations: Abdomen is soft. There is no  hepatomegaly, splenomegaly or mass.     Tenderness: There is no abdominal tenderness.  Musculoskeletal:     Right lower leg: No edema.  Left lower leg: No edema.  Lymphadenopathy:     Cervical: No cervical adenopathy.     Right cervical: No superficial, deep or posterior cervical adenopathy.    Left cervical: No superficial, deep or posterior cervical adenopathy.     Upper Body:     Right upper body: No supraclavicular or axillary adenopathy.     Left upper body: No supraclavicular or axillary adenopathy.  Neurological:     General: No focal deficit present.     Mental Status: She is alert and oriented to person, place, and time.  Psychiatric:        Mood and Affect: Mood normal.        Behavior: Behavior normal.     LABS:      Latest Ref Rng & Units 04/30/2023    4:07 AM 04/16/2023   10:25 AM 04/03/2023    3:45 AM  CBC  WBC 4.0 - 10.5 K/uL 11.0  5.0  5.0   Hemoglobin 12.0 - 15.0 g/dL 16.1  09.6  9.3   Hematocrit 36.0 - 46.0 % 37.0  35.0  30.4   Platelets 150 - 400 K/uL 188  196  116       Latest Ref Rng & Units 04/30/2023    4:07 AM 04/16/2023   10:25 AM 04/03/2023    3:45 AM  CMP  Glucose 70 - 99 mg/dL 045  409  811   BUN 8 - 23 mg/dL 10  9  15    Creatinine 0.44 - 1.00 mg/dL 9.14  7.82  9.56   Sodium 135 - 145 mmol/L 135  136  133   Potassium 3.5 - 5.1 mmol/L 4.4  3.4  4.6   Chloride 98 - 111 mmol/L 99  101  102   CO2 22 - 32 mmol/L 29  27  25    Calcium 8.9 - 10.3 mg/dL 8.2  8.4  7.4   Total Protein 6.5 - 8.1 g/dL 5.5  6.4  5.0   Total Bilirubin <1.2 mg/dL 0.6  1.0  1.3   Alkaline Phos 38 - 126 U/L 91  105  187   AST 15 - 41 U/L 48  21  24   ALT 0 - 44 U/L 18  12  32      No results found for: "CEA1", "CEA" / No results found for: "CEA1", "CEA" No results found for: "PSA1" No results found for: "OZH086" No results found for: "CAN125"  No results found for: "TOTALPROTELP", "ALBUMINELP", "A1GS", "A2GS", "BETS", "BETA2SER", "GAMS", "MSPIKE", "SPEI" No  results found for: "TIBC", "FERRITIN", "IRONPCTSAT" Lab Results  Component Value Date   LDH 348 (H) 03/30/2023   LDH 137 06/05/2022   LDH 144 05/13/2022     STUDIES:   No results found.

## 2023-05-07 ENCOUNTER — Inpatient Hospital Stay: Payer: Medicare Other

## 2023-05-07 ENCOUNTER — Inpatient Hospital Stay: Payer: Medicare Other | Admitting: Hematology

## 2023-05-07 VITALS — BP 126/58 | HR 87 | Temp 97.6°F | Resp 18 | Ht 62.0 in | Wt 164.2 lb

## 2023-05-07 DIAGNOSIS — E039 Hypothyroidism, unspecified: Secondary | ICD-10-CM | POA: Diagnosis not present

## 2023-05-07 DIAGNOSIS — I514 Myocarditis, unspecified: Secondary | ICD-10-CM

## 2023-05-07 DIAGNOSIS — M79651 Pain in right thigh: Secondary | ICD-10-CM | POA: Diagnosis not present

## 2023-05-07 DIAGNOSIS — C4371 Malignant melanoma of right lower limb, including hip: Secondary | ICD-10-CM | POA: Diagnosis not present

## 2023-05-07 DIAGNOSIS — Z79620 Long term (current) use of immunosuppressive biologic: Secondary | ICD-10-CM | POA: Diagnosis not present

## 2023-05-07 DIAGNOSIS — Z8679 Personal history of other diseases of the circulatory system: Secondary | ICD-10-CM | POA: Diagnosis not present

## 2023-05-07 DIAGNOSIS — Z5112 Encounter for antineoplastic immunotherapy: Secondary | ICD-10-CM | POA: Diagnosis not present

## 2023-05-07 DIAGNOSIS — C7951 Secondary malignant neoplasm of bone: Secondary | ICD-10-CM | POA: Diagnosis not present

## 2023-05-07 DIAGNOSIS — Z95828 Presence of other vascular implants and grafts: Secondary | ICD-10-CM

## 2023-05-07 LAB — COMPREHENSIVE METABOLIC PANEL
ALT: 12 U/L (ref 0–44)
AST: 21 U/L (ref 15–41)
Albumin: 2.7 g/dL — ABNORMAL LOW (ref 3.5–5.0)
Alkaline Phosphatase: 82 U/L (ref 38–126)
Anion gap: 8 (ref 5–15)
BUN: 6 mg/dL — ABNORMAL LOW (ref 8–23)
CO2: 26 mmol/L (ref 22–32)
Calcium: 8.1 mg/dL — ABNORMAL LOW (ref 8.9–10.3)
Chloride: 101 mmol/L (ref 98–111)
Creatinine, Ser: 0.78 mg/dL (ref 0.44–1.00)
GFR, Estimated: >60 mL/min (ref >60)
Glucose, Bld: 108 mg/dL — ABNORMAL HIGH (ref 70–99)
Potassium: 3.8 mmol/L (ref 3.5–5.1)
Sodium: 135 mmol/L (ref 135–145)
Total Bilirubin: 0.9 mg/dL (ref <1.2)
Total Protein: 6.1 g/dL — ABNORMAL LOW (ref 6.5–8.1)

## 2023-05-07 LAB — CBC WITH DIFFERENTIAL/PLATELET
Abs Immature Granulocytes: 0.02 10*3/uL (ref 0.00–0.07)
Basophils Absolute: 0 10*3/uL (ref 0.0–0.1)
Basophils Relative: 1 %
Eosinophils Absolute: 0.1 10*3/uL (ref 0.0–0.5)
Eosinophils Relative: 1 %
HCT: 33.9 % — ABNORMAL LOW (ref 36.0–46.0)
Hemoglobin: 10.5 g/dL — ABNORMAL LOW (ref 12.0–15.0)
Immature Granulocytes: 1 %
Lymphocytes Relative: 22 %
Lymphs Abs: 0.9 10*3/uL (ref 0.7–4.0)
MCH: 27.5 pg (ref 26.0–34.0)
MCHC: 31 g/dL (ref 30.0–36.0)
MCV: 88.7 fL (ref 80.0–100.0)
Monocytes Absolute: 0.4 10*3/uL (ref 0.1–1.0)
Monocytes Relative: 10 %
Neutro Abs: 2.8 10*3/uL (ref 1.7–7.7)
Neutrophils Relative %: 65 %
Platelets: 199 10*3/uL (ref 150–400)
RBC: 3.82 MIL/uL — ABNORMAL LOW (ref 3.87–5.11)
RDW: 14.8 % (ref 11.5–15.5)
WBC: 4.3 10*3/uL (ref 4.0–10.5)
nRBC: 0 % (ref 0.0–0.2)

## 2023-05-07 LAB — TROPONIN I (HIGH SENSITIVITY): Troponin I (High Sensitivity): 8 ng/L (ref <18)

## 2023-05-07 LAB — MAGNESIUM: Magnesium: 2 mg/dL (ref 1.7–2.4)

## 2023-05-07 LAB — TSH: TSH: 1.448 u[IU]/mL (ref 0.350–4.500)

## 2023-05-07 MED ORDER — FAMOTIDINE IN NACL 20-0.9 MG/50ML-% IV SOLN
20.0000 mg | Freq: Once | INTRAVENOUS | Status: AC
  Administered 2023-05-07: 20 mg via INTRAVENOUS
  Filled 2023-05-07: qty 50

## 2023-05-07 MED ORDER — SODIUM CHLORIDE 0.9 % IV SOLN
200.0000 mg | Freq: Once | INTRAVENOUS | Status: AC
  Administered 2023-05-07: 200 mg via INTRAVENOUS
  Filled 2023-05-07: qty 200

## 2023-05-07 MED ORDER — CETIRIZINE HCL 10 MG/ML IV SOLN
5.0000 mg | Freq: Once | INTRAVENOUS | Status: AC
  Administered 2023-05-07: 5 mg via INTRAVENOUS
  Filled 2023-05-07: qty 1

## 2023-05-07 MED ORDER — SODIUM CHLORIDE 0.9% FLUSH
10.0000 mL | INTRAVENOUS | Status: DC | PRN
  Administered 2023-05-07: 10 mL via INTRAVENOUS

## 2023-05-07 MED ORDER — SODIUM CHLORIDE 0.9 % IV SOLN: Freq: Once | INTRAVENOUS | Status: AC

## 2023-05-07 MED ORDER — SODIUM CHLORIDE 0.9 % IV SOLN
1.0000 mg/kg | Freq: Once | INTRAVENOUS | Status: AC
  Administered 2023-05-07: 80 mg via INTRAVENOUS
  Filled 2023-05-07: qty 16

## 2023-05-07 MED ORDER — HEPARIN SOD (PORK) LOCK FLUSH 100 UNIT/ML IV SOLN
500.0000 [IU] | Freq: Once | INTRAVENOUS | Status: AC | PRN
  Administered 2023-05-07: 500 [IU]

## 2023-05-07 MED ORDER — SODIUM CHLORIDE 0.9% FLUSH
10.0000 mL | INTRAVENOUS | Status: DC | PRN
  Administered 2023-05-07: 10 mL

## 2023-05-07 NOTE — Patient Instructions (Signed)
[facility] [place] - A DEPT OF [facility]  Discharge Instructions: Thank you for choosing [facility] to provide your oncology and hematology care.  If you have a lab appointment with the Cancer Center - please note that after April 8th, 2024, all labs will be drawn in the cancer center.  You do not have to check in or register with the main entrance as you have in the past but will complete your check-in in the cancer center.  Wear comfortable clothing and clothing appropriate for easy access to any Portacath or PICC line.   We strive to give you quality time with your provider. You may need to reschedule your appointment if you arrive late (15 or more minutes).  Arriving late affects you and other patients whose appointments are after yours.  Also, if you miss three or more appointments without notifying the office, you may be dismissed from the clinic at the provider's discretion.      For prescription refill requests, have your pharmacy contact our office and allow 72 hours for refills to be completed.    Today you received the following chemotherapy and/or immunotherapy agents keytruda and yervoy.       To help prevent nausea and vomiting after your treatment, we encourage you to take your nausea medication as directed.  BELOW ARE SYMPTOMS THAT SHOULD BE REPORTED IMMEDIATELY: *FEVER GREATER THAN 100.4 F (38 C) OR HIGHER *CHILLS OR SWEATING *NAUSEA AND VOMITING THAT IS NOT CONTROLLED WITH YOUR NAUSEA MEDICATION *UNUSUAL SHORTNESS OF BREATH *UNUSUAL BRUISING OR BLEEDING *URINARY PROBLEMS (pain or burning when urinating, or frequent urination) *BOWEL PROBLEMS (unusual diarrhea, constipation, pain near the anus) TENDERNESS IN MOUTH AND THROAT WITH OR WITHOUT PRESENCE OF ULCERS (sore throat, sores in mouth, or a toothache) UNUSUAL RASH, SWELLING OR PAIN  UNUSUAL VAGINAL DISCHARGE OR ITCHING   Items with * indicate a potential emergency and should be  followed up as soon as possible or go to the Emergency Department if any problems should occur.  Please show the CHEMOTHERAPY ALERT CARD or IMMUNOTHERAPY ALERT CARD at check-in to the Emergency Department and triage nurse.  Should you have questions after your visit or need to cancel or reschedule your appointment, please contact [facility] [place] - A DEPT OF Eligha Brideg[facility] [phone]  and follow the prompts.  Office hours are 8:00 a.m. to 4:30 p.m. Monday - Friday. Please note that voicemails left after 4:00 p.m. may not be returned until the following business day.  We are closed weekends and major holidays. You have access to a nurse at all times for urgent questions. Please call the main number to the clinic [phone] and follow the prompts.  For any non-urgent questions, you may also contact your provider using MyChart. We now offer e-Visits for anyone 79 and older to request care online for non-urgent symptoms. For details visit [email or web address].   Also download the MyChart app! Go to the app store, search "MyChart", open the app, select [place], and log in with your MyChart username and password.

## 2023-05-07 NOTE — Progress Notes (Unsigned)
Ipilimumab (YERVOY) Patient Monitoring Assessment   Is the patient experiencing any of the following general symptoms?:  [ ] Difficulty performing normal activities [ ] Feeling sluggish or cold all the time [ ] Unusual weight gain [ ] Constant or unusual headaches [ ] Feeling dizzy or faint [ ] Changes in eyesight (blurry vision, double vision, or other vision problems) [ ] Changes in mood or behavior (ex: decreased sex drive, irritability, or forgetfulness) [ ] Starting new medications (ex: steroids, other medications that lower immune response) [ x]Patient is not experiencing any of the general symptoms above.   Gastrointestinal  Patient is having normal bowel movements each day.  Is this different from baseline? [ ] Yes [ ] xNo Are your stools watery or do they have a foul smell? [ ] Yes [ x]No Have you seen blood in your stools? [ ] Yes [ x]No Are your stools dark, tarry, or sticky? [ ] Yes [x ]No Are you having pain or tenderness in your belly? [ ] Yes [ x]No  Skin Does your skin itch? [ ] Yes [ x]No Do you have a rash? [ ] Yes [ x]No Has your skin blistered and/or peeled? [ ] Yes [ x]No Do you have sores in your mouth? [ ] Yes [ x]No  Hepatic Has your urine been dark or tea colored? [ ] Yes [x ]No Have you noticed that your skin or the whites of your eyes are turning yellow? [ ] Yes [ x]No Are you bleeding or bruising more easily than normal? [ ] Yes [ ] xNo Are you nauseous and/or vomiting? [ ] Yes [x ]No Do you have pain on the right side of your stomach? [ ] Yes [ x]No  Neurologic  Are you having unusual weakness of legs, arms, or face? [ ] Yes [ x]No Are you having numbness or tingling in your hands or feet? [ ] Yes [ x]No  Rosanne Gutting

## 2023-05-07 NOTE — Progress Notes (Signed)

## 2023-05-09 LAB — T4: T4, Total: 9.1 ug/dL (ref 4.5–12.0)

## 2023-05-14 ENCOUNTER — Ambulatory Visit (INDEPENDENT_AMBULATORY_CARE_PROVIDER_SITE_OTHER): Payer: Medicare Other | Admitting: General Surgery

## 2023-05-14 DIAGNOSIS — K8033 Calculus of bile duct with acute cholangitis with obstruction: Secondary | ICD-10-CM

## 2023-05-14 NOTE — Progress Notes (Signed)
 [facility]  I am calling the patient for post operative evaluation. This is not a billable encounter as it is under the global charges for the surgery.  The patient had a robotic cholecystectomy on 12/18. The patient reports that she is doing great. She was sore at first but now doing great. The are tolerating a diet, having good pain control, and having regular Bms.  The incisions are healing. The patient has no concerns.   Pathology: - Chronic cholecystitis and cholelithiasis  - Benign lymph node   Will see the patient PRN.   Manuelita Pander, MD [facility] [address] [place], [place] [address] [phone] (office)

## 2023-05-18 ENCOUNTER — Ambulatory Visit: Payer: Medicare Other

## 2023-05-18 ENCOUNTER — Inpatient Hospital Stay: Payer: Medicare Other

## 2023-05-21 ENCOUNTER — Other Ambulatory Visit (HOSPITAL_COMMUNITY): Payer: Medicare Other

## 2023-05-22 ENCOUNTER — Encounter (HOSPITAL_COMMUNITY)
Admission: RE | Admit: 2023-05-22 | Discharge: 2023-05-22 | Disposition: A | Discharge status: Home / Self Care | Payer: Medicare Other | Source: Ambulatory Visit | Attending: Hematology | Admitting: Hematology

## 2023-05-22 DIAGNOSIS — C4371 Malignant melanoma of right lower limb, including hip: Secondary | ICD-10-CM | POA: Diagnosis not present

## 2023-05-22 LAB — GLUCOSE, CAPILLARY: Glucose-Capillary: 131 mg/dL — ABNORMAL HIGH (ref 70–99)

## 2023-05-22 MED ORDER — FLUDEOXYGLUCOSE F - 18 (FDG) INJECTION
8.2000 | Freq: Once | INTRAVENOUS | Status: AC | PRN
Start: 2023-05-22 — End: 2023-05-22
  Administered 2023-05-22: 8.2 via INTRAVENOUS

## 2023-05-26 NOTE — Progress Notes (Signed)
 [facility] [address] [place] [address]    Clinic Day:  05/27/2023  Referring physician: Veda Gerald, MD  Patient Care Team: Veda Gerald, MD as PCP - General (Internal Medicine) Paulett Boros, MD as Medical Oncologist (Medical Oncology) Gerhard Knuckles, RN as Oncology Nurse Navigator (Oncology)   ASSESSMENT & PLAN:   Assessment: 1. Malignant melanoma of right posterior leg (pT4 pN3 M1): - She noticed fleshy lesion 4 months ago on the right leg. - Her PMD did a biopsy which was consistent with melanoma. - Wide local excision and lymph node biopsy by Dr. Cherlynn Cornfield on 02/12/2021. - Pathology margins free, nodular type, Breslow's thickness 9.52 mm, Clark level V, deep margins free, ulceration absent, satellitosis absent, mitotic index 2/millimeters squared, LVI negative.  Neurotropism absent.  Tumor infiltrating lymphocytes absent.  Tumor regression not noted.  3 out of 3 positive sentinel lymph nodes for melanoma. - PET CT scan on 03/22/2021 with 3 foci of intense radiotracer activity within the right lower extremity.  In the medial right upper thigh nodule just superficial to thigh musculature measuring 11 mm with SUV 7.6.  Intramedullary hypermetabolic activity within the shaft of the mid right femur with SUV 14.6.  Intense activity associated with condylar notch of the distal right femur, appears to localize to soft tissue rather than the bone.  More diffuse activity associated with the medial right calf related to excision biopsy.  There is a focus of activity adjacent to the lateral margin of the right iliac wing SUV 4.1.  No evidence of visceral metastatic disease in the chest, abdomen or pelvis. 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 - NGS testing-BRAF negative, positive for NRAS  pathogenic variant exon 3, TERT promoter.  MSI-stable.  MMR-proficient.  NTRK 1/2/3 fusion not detected.  KIT mutation negative.  PD-L1 negative. - Nivolumab -Relatlimab  every 28 days started on 05/09/2021.  Held permanently after first dose due to myocarditis. - PET scan on 07/25/2021: Slight progression with no new sites of metastatic disease. - Pembrolizumab  started on 09/03/2021.  XRT to the soft tissue nodule within the medial right thigh and midshaft right femoral lesion from 03/26/2022 through 04/09/2022. -We have discussed further options including adding a low-dose ipilimumab  to pembrolizumab  which she is already receiving without any side effects.  I have quoted response rates around 30% (J Clin Oncol. 2021 Aug 20;39(24):2647-2655. doi: 10.1200/JCO.21.00079).  We also discussed the possibility increased chance of immunotherapy related side effects with combination of PD-1 and anti-CTL A4 antibody. - We also discussed option of binimetinib as she has NRAS mutation on previous NGS testing.  However this option is also associated with significant side effects and overall response rate of around 20%. - We also discussed other options including adding lenvatinib to pembrolizumab , least favorable option due to lack of robust data.  We have discussed chemotherapy options with dacarbazine/temozolomide among others. - Pembrolizumab  with low-dose ipilimumab  started on 01/14/2023 - Guardant360 (12/2022): NRAS Q61R (?  Binimetinib), T p53 C17 6Y, TERT promoter SNV, TMB 20.8, MSI high not detected.   2. Social/family history: - She is seen with her son today.  She lives by herself at home.  She is independent of ADLs and IADLs.  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  She is non-smoker. -  Father had colon cancer.  Sister had breast cancer and colon cancer.  Maternal aunt had breast cancer.  Paternal uncle had colon cancer.    Plan: 1. Malignant melanoma of the  right posterior leg (pT4 pN3 M1), BRAF V600 negative: - Hospitalized from 03/30/2023 through 04/03/2023 with E. coli bacteremia and cholangitis. - Underwent laparoscopic cholecystectomy on 04/29/2023. - Last about 7 pounds in the last 1 month since surgery. - Last treatment was on 05/07/2023. - We reviewed PET scan from 05/22/2023: Postsurgical changes in the gallbladder bed as well as 2 soft tissue nodules in the right upper quadrant of the abdomen.  Stable distal right femur and soft tissue nodule within the medial right thigh.  New focal area of increased uptake in the anterior inferior aspect of L3 vertebral body with no CT correlate.  Multiple right-sided pelvic lymph nodes, new from August 2024 PET scan but stable from November 2020 for CT scan. - She had only received 4 total treatments with pembrolizumab  and low-dose ipilimumab  since September.  PET scan showed mild progression.  Considering alternative treatments available (temozolomide, carboplatin plus paclitaxel), we have decided to continue pembrolizumab  and low-dose ipilimumab  for another 3 cycles and rescan her.  She and her son are agreeable. - We will repeat a Guardant360.  We will proceed with treatment today.  RTC 3 weeks for follow-up.  2.  History of immunotherapy myocarditis: - Troponin is 10 today.  She may proceed with treatment.  3.  Right medial thigh pain: - She has some sensitivity in that area but no pain.  4.  Hypothyroidism: - Continue Synthroid  137 mcg daily.  TSH is slightly low at 0.196.  Will closely monitor.    Orders Placed This Encounter  Procedures   Magnesium     Standing Status:   Future    Expected Date:   07/08/2023    Expiration Date:   07/07/2024   CBC with Differential    Standing Status:   Future    Expected Date:   07/08/2023    Expiration Date:   07/08/2024   Comprehensive metabolic panel    Standing Status:   Future    Expected Date:   07/08/2023    Expiration Date:   07/08/2024   T4     Standing Status:   Future    Expected Date:   07/08/2023    Expiration Date:   07/08/2024   TSH    Standing Status:   Future    Expected Date:   07/08/2023    Expiration Date:   07/08/2024   Magnesium     Standing Status:   Future    Expected Date:   07/29/2023    Expiration Date:   07/28/2024   CBC with Differential    Standing Status:   Future    Expected Date:   07/29/2023    Expiration Date:   07/29/2024   Comprehensive metabolic panel    Standing Status:   Future    Expected Date:   07/29/2023    Expiration Date:   07/29/2024   T4    Standing Status:   Future    Expected Date:   07/29/2023    Expiration Date:   07/29/2024   TSH    Standing Status:   Future    Expected Date:   07/29/2023    Expiration Date:   07/29/2024      Hurman Maiden R Teague,acting as a scribe for Paulett Boros, MD.,have documented all relevant documentation on the behalf  of Elizabeth Mcneff, MD,as directed by  Paulett Boros, MD while in the presence of Paulett Boros, MD.  I, Paulett Boros MD, have reviewed the above documentation for accuracy and completeness, and I agree with the above.       Paulett Boros, MD   1/15/20251:46 PM  CHIEF COMPLAINT:   Diagnosis: metastatic malignant melanoma, BRAF V600 negative    Cancer Staging  Malignant melanoma of lower leg, right (HCC) Staging form: Melanoma of the Skin, AJCC 8th Edition - Clinical stage from 03/27/2021: Stage IV (cT4a, cN3, cM1) - Unsigned    Prior Therapy: none  Current Therapy:  Pembrolizumab  (200) q21d Keytruda     HISTORY OF PRESENT ILLNESS:   Oncology History  Malignant melanoma of lower leg, right (HCC)  03/27/2021 Initial Diagnosis   Malignant melanoma of lower leg, right (HCC)   05/09/2021 - 05/09/2021 Chemotherapy   Patient is on Treatment Plan : MELANOMA - Opdualag  (nivolumab /relatlimab ) q28d     09/03/2021 - 01/07/2022 Chemotherapy   Patient is on Treatment Plan : MELANOMA Pembrolizumab  (200)  q21d     09/03/2021 -  Chemotherapy   Patient is on Treatment Plan : MELANOMA Pembrolizumab  (200) + Yervoy  (1mg /kg) q21d        INTERVAL HISTORY:   Elizabeth Norman is a 83 y.o. female presenting to clinic today for follow up of metastatic malignant melanoma, BRAF V600 negative. She was last seen by me on 04/16/23.  Since her last visit, she underwent cholecystectomy on 04/29/23 with Dr. Collene Dawson. She had restaging PET done on 05/22/23.   Today, she states that she is doing well overall. Her appetite level is at 100%. Her energy level is at 70%.  PAST MEDICAL HISTORY:   Past Medical History: Past Medical History:  Diagnosis Date   Arthritis    Hypertension    Malignant melanoma of lower leg, right (HCC) 03/27/2021   Port-A-Cath in place 04/30/2021    Surgical History: Past Surgical History:  Procedure Laterality Date   BIOPSY  04/02/2023   Procedure: BIOPSY;  Surgeon: Asencion Blacksmith, MD;  Location: [facility] ENDOSCOPY;  Service: Gastroenterology;;   CATARACT EXTRACTION W/PHACO Right 10/03/2014   Procedure: CATARACT EXTRACTION PHACO AND INTRAOCULAR LENS PLACEMENT (IOC);  Surgeon: Albert Huff, MD;  Location: [facility] ORS;  Service: Ophthalmology;  Laterality: Right;  CDE:10.09   CATARACT EXTRACTION W/PHACO Left 10/10/2014   Procedure: CATARACT EXTRACTION PHACO AND INTRAOCULAR LENS PLACEMENT (IOC);  Surgeon: Albert Huff, MD;  Location: [facility] ORS;  Service: Ophthalmology;  Laterality: Left;  CDE:8.69   CORNEAL 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   CORNEAL TRANSPLANT Left 2020   DENTAL RESTORATION/EXTRACTION WITH X-RAY     ERCP N/A 04/02/2023   Procedure: ENDOSCOPIC RETROGRADE CHOLANGIOPANCREATOGRAPHY (ERCP);  Surgeon: Asencion Blacksmith, MD;  Location: [facility] ENDOSCOPY;  Service: Gastroenterology;  Laterality: N/A;   IR IMAGING GUIDED PORT INSERTION  04/05/2021   LEFT HEART CATH AND CORONARY ANGIOGRAPHY N/A 05/28/2021   Procedure: LEFT HEART CATH AND CORONARY ANGIOGRAPHY;  Surgeon: Cody Das, MD;  Location: [facility]  INVASIVE CV LAB;  Service: Cardiovascular;  Laterality: N/A;   MELANOMA EXCISION Right 02/12/2021   Procedure: WIDE LOCAL EXCISION RIGHT LOWER LEG MELANOMA , ADVANCEMENT FLAP CLOSURE DEFECT;  Surgeon: Lockie Rima, MD;  Location: [facility] OR;  Service: General;  Laterality: Right;   REMOVAL OF STONES  04/02/2023   Procedure: REMOVAL OF STONES;  Surgeon: Asencion Blacksmith, MD;  Location: [facility] ENDOSCOPY;  Service: Gastroenterology;;   Daryle Eon  04/02/2023   Procedure: Daryle Eon;  Surgeon: Sandrea Cruel,  Ellyn Hack, MD;  Location: [facility] ENDOSCOPY;  Service: Gastroenterology;;   SENTINEL NODE BIOPSY Right 02/12/2021   Procedure: SENTINEL NODE BIOPSY;  Surgeon: Lockie Rima, MD;  Location: [facility] OR;  Service: General;  Laterality: Right;   SPHINCTEROTOMY  04/02/2023   Procedure: [address];  Surgeon: Asencion Blacksmith, MD;  Location: [facility] ENDOSCOPY;  Service: Gastroenterology;;    Social History: Social History   Socioeconomic History   Marital status: Widowed    Spouse name: Not on file   Number of children: 3   Years of education: Not on file   Highest education level: Not on file  Occupational History   Not on file  Tobacco Use   Smoking status: Never   Smokeless tobacco: Never  Vaping Use   Vaping status: Never Used  Substance and Sexual Activity   Alcohol use: No   Drug use: No   Sexual activity: Not on file  Other Topics Concern   Not on file  Social History Narrative   Not on file   Social Drivers of Health   Financial Resource Strain: Not on file  Food Insecurity: No Food Insecurity (04/29/2023)   Hunger Vital Sign    Worried About Running Out of Food in the Last Year: Never true    Ran Out of Food in the Last Year: Never true  Transportation Needs: No Transportation Needs (04/29/2023)   PRAPARE - Administrator, Civil Service (Medical): No    Lack of Transportation (Non-Medical): No  Physical Activity: Not on file  Stress: Not on file  Social Connections: Not on  file  Intimate Partner Violence: Not At Risk (04/29/2023)   Humiliation, Afraid, Rape, and Kick questionnaire    Fear of Current or Ex-Partner: No    Emotionally Abused: No    Physically Abused: No    Sexually Abused: No    Family History: Family History  Problem Relation Age of Onset   Cancer Father    Hypertension Sister    Hypertension Sister     Current Medications:  Current Outpatient Medications:    ALPRAZolam  (XANAX ) 0.5 MG tablet, Take 0.5 mg by mouth 3 (three) times daily., Disp: , Rfl: 2   calcium  carbonate (OSCAL) 1500 (600 Ca) MG TABS tablet, Take 600 mg of elemental calcium  by mouth 3 (three) times a week., Disp: , Rfl:    cholecalciferol (VITAMIN D) 1000 UNITS tablet, Take 1,000 Units by mouth 3 (three) times a week., Disp: , Rfl:    Cyanocobalamin (VITAMIN B-12 PO), Take 1 tablet by mouth once a week., Disp: , Rfl:    levothyroxine  (SYNTHROID ) 125 MCG tablet, Take 1 tablet (125 mcg total) by mouth daily before breakfast., Disp: 30 tablet, Rfl: 3   metoprolol  tartrate (LOPRESSOR ) 25 MG tablet, Take 1 tablet (25 mg total) by mouth 2 (two) times daily. Hold until follow-up with your doctor/cardiologist, Disp: , Rfl:    ondansetron  (ZOFRAN -ODT) 4 MG disintegrating tablet, Take 1 tablet (4 mg total) by mouth every 6 (six) hours as needed for nausea., Disp: 20 tablet, Rfl: 0   prednisoLONE  acetate (PRED FORTE ) 1 % ophthalmic suspension, Place 1 drop into both eyes daily., Disp: , Rfl:    Allergies: Allergies  Allergen Reactions   Tape     Pulls skin, please use paper tape    REVIEW OF SYSTEMS:   Review of Systems  Constitutional:  Negative for chills, fatigue and fever.  HENT:   Negative for lump/mass, mouth sores, nosebleeds, sore throat  and trouble swallowing.   Eyes:  Negative for eye problems.  Respiratory:  Positive for shortness of breath. Negative for cough.   Cardiovascular:  Negative for chest pain, leg swelling and palpitations.  Gastrointestinal:   Negative for abdominal pain, constipation, diarrhea, nausea and vomiting.  Genitourinary:  Negative for bladder incontinence, difficulty urinating, dysuria, frequency, hematuria and nocturia.   Musculoskeletal:  Negative for arthralgias, back pain, flank pain, myalgias and neck pain.  Skin:  Negative for itching and rash.  Neurological:  Negative for dizziness, headaches and numbness.  Hematological:  Does not bruise/bleed easily.  Psychiatric/Behavioral:  Negative for depression, sleep disturbance and suicidal ideas. The patient is not nervous/anxious.   All other systems reviewed and are negative.    VITALS:   Blood pressure (!) 143/68, pulse 93, temperature 98 F (36.7 C), temperature source Oral, resp. rate 17, weight 155 lb 1.6 oz (70.4 kg), SpO2 98%.  Wt Readings from Last 3 Encounters:  05/27/23 155 lb 1.6 oz (70.4 kg)  05/07/23 164 lb 3.2 oz (74.5 kg)  04/29/23 162 lb 0.6 oz (73.5 kg)    Body mass index is 28.37 kg/m.  Performance status (ECOG): 1 - Symptomatic but completely ambulatory  PHYSICAL EXAM:   Physical Exam Vitals and nursing note reviewed. Exam conducted with a chaperone present.  Constitutional:      Appearance: Normal appearance.  Cardiovascular:     Rate and Rhythm: Normal rate and regular rhythm.     Pulses: Normal pulses.     Heart sounds: Normal heart sounds.  Pulmonary:     Effort: Pulmonary effort is normal.     Breath sounds: Normal breath sounds.  Abdominal:     Palpations: Abdomen is soft. There is no hepatomegaly, splenomegaly or mass.     Tenderness: There is no abdominal tenderness.  Musculoskeletal:     Right lower leg: No edema.     Left lower leg: No edema.  Lymphadenopathy:     Cervical: No cervical adenopathy.     Right cervical: No superficial, deep or posterior cervical adenopathy.    Left cervical: No superficial, deep or posterior cervical adenopathy.     Upper Body:     Right upper body: No supraclavicular or axillary  adenopathy.     Left upper body: No supraclavicular or axillary adenopathy.  Neurological:     General: No focal deficit present.     Mental Status: She is alert and oriented to person, place, and time.  Psychiatric:        Mood and Affect: Mood normal.        Behavior: Behavior normal.     LABS:      Latest Ref Rng & Units 05/27/2023   11:57 AM 05/07/2023   10:35 AM 04/30/2023    4:07 AM  CBC  WBC 4.0 - 10.5 K/uL 5.1  4.3  11.0   Hemoglobin 12.0 - 15.0 g/dL 29.5  62.1  30.8   Hematocrit 36.0 - 46.0 % 36.0  33.9  37.0   Platelets 150 - 400 K/uL 197  199  188       Latest Ref Rng & Units 05/27/2023   11:57 AM 05/07/2023   10:35 AM 04/30/2023    4:07 AM  CMP  Glucose 70 - 99 mg/dL 657  846  962   BUN 8 - 23 mg/dL 10  6  10    Creatinine 0.44 - 1.00 mg/dL 9.52  8.41  3.24   Sodium 135 - 145 mmol/L  137  135  135   Potassium 3.5 - 5.1 mmol/L 3.6  3.8  4.4   Chloride 98 - 111 mmol/L 105  101  99   CO2 22 - 32 mmol/L 24  26  29    Calcium  8.9 - 10.3 mg/dL 8.8  8.1  8.2   Total Protein 6.5 - 8.1 g/dL 6.7  6.1  5.5   Total Bilirubin 0.0 - 1.2 mg/dL 1.1  0.9  0.6   Alkaline Phos 38 - 126 U/L 92  82  91   AST 15 - 41 U/L 23  21  48   ALT 0 - 44 U/L 11  12  18       No results found for: "CEA1", "CEA" / No results found for: "CEA1", "CEA" No results found for: "PSA1" No results found for: "NFA213" No results found for: "CAN125"  No results found for: "TOTALPROTELP", "ALBUMINELP", "A1GS", "A2GS", "BETS", "BETA2SER", "GAMS", "MSPIKE", "SPEI" No results found for: "TIBC", "FERRITIN", "IRONPCTSAT" Lab Results  Component Value Date   LDH 348 (H) 03/30/2023   LDH 137 06/05/2022   LDH 144 05/13/2022     STUDIES:   NM PET Image Restage (PS) Whole Body Result Date: 05/27/2023 CLINICAL DATA:  Subsequent treatment strategy for right lower extremity melanoma. EXAM: NUCLEAR MEDICINE PET WHOLE BODY TECHNIQUE: 8.2 mCi F-18 FDG was injected intravenously. Full-ring PET imaging was  performed from the head to foot after the radiotracer. CT data was obtained and used for attenuation correction and anatomic localization. Fasting blood glucose: 131 mg/dl COMPARISON:  MRI 08/65/7846, CT chest, abdomen and pelvis 03/30/2023, and PET-CT 01/01/2023 FINDINGS: Mediastinal blood pool activity: SUV max 2.92 HEAD/NECK: Again seen is a focal area of increased radiotracer uptake in the deep lobe of the right parotid gland with SUV max 4.44. previously 3.5. Incidental CT findings: none CHEST: No hypermetabolic mediastinal or hilar nodes. No suspicious pulmonary nodules on the CT scan. Incidental CT findings: Aortic atherosclerosis and coronary artery calcifications. ABDOMEN/PELVIS: Since the prior exam the patient has undergone interval cholecystectomy. There is increased radiotracer uptake within the gallbladder fossa and in the expected location of the cystic duct remnant with SUV max 13.85, image 123 of the fused PET-CT images. No abnormal tracer activity within the pancreas, spleen, or adrenal glands. No tracer avid lymph nodes within the upper abdomen. Multiple tracer avid right-sided pelvic lymph nodes are identified including proximal right external iliac node measuring 1.6 cm with SUV max of 15.1, image 169/4. On 03/30/2023 this measured 1.9 cm. This is a new finding compared with the previous PET-CT. Right external iliac lymph node measures 2.7 cm and has an SUV max of 11.88, image 179/4. On 03/30/2023 this measured 2.2 cm. Also new compared with the previous PET-CT. Distal right external iliac lymph node measures 2.6 cm with SUV max of 14.14. On 03/30/2023 this measured 2.7 cm. On the previous PET-CT this measured 1.4 cm with SUV max 8.75. Right inguinal lymph node measures 2.3 cm with SUV max 11.64. On 03/30/2023 this measured 2.1 cm. On the previous PET-CT 9 this measured 2 cm with SUV max of 9.74. Within the right upper quadrant of the abdomen there are 2 soft tissue nodules which are tracer  avid, new from the previous PET-CT in new from the CT from 03/30/2023. Index nodule measures 0.9 cm with SUV max 4.96. More laterally, there is a soft tissue nodule just below the inferior margin of the right lobe of liver measuring 8 mm with SUV max  of 6.13, image 130/4. Also new from previous imaging. Presacral soft tissue mass containing fat is again noted measuring 6.8 x 6.2 cm with SUV max of 2.99, image 183/4. Unchanged compared with the prior examination. Incidental CT findings: Status post cholecystectomy. SKELETON: There is a new focal area of increased radiotracer uptake within the anterior inferior aspect of the L3 vertebral body. The SUV max is equal to 7.34., image 148 of the fused PET-CT images. No corresponding CT abnormality identified. Incidental CT findings: Melanoma site within the medial right thigh contains soft tissue nodule EXTREMITIES: Tracer avid lesion within the distal diaphysis of the right femur has an SUV max 3.63, image 275 of the fused PET-CT images. Formally 3.04. Soft tissue nodule within the medial right thigh measures 1.4 cm with SUV max of 4.36. Previously 1.1 cm with SUV max of 5.4. Incidental CT findings: none IMPRESSION: 1. Since the prior exam the patient has undergone interval cholecystectomy. There is increased radiotracer uptake within the gallbladder fossa and in the expected location of the cystic duct remnant. Findings are nonspecific, but favored to be postoperative in nature. Attention on follow-up imaging is advised. 2. There are 2 soft tissue nodules within the right upper quadrant of the abdomen which are tracer avid, new from the previous PET-CT and new from the CT from 03/30/2023. Indeterminate. Peritoneal metastasis cannot be excluded. However, given recent surgery it is conceivable that these findings may reflect postoperative change. Attention in these areas on future surveillance imaging advised. 3. Multiple tracer avid right-sided pelvic lymph nodes are  identified compatible with nodal metastasis. These are new compared with the previous PET-CT from 01/01/2023. Compared with the more recent CT from 03/30/2023 these nodes are overall similar in size as detailed above. 4. There is a new focal area of increased radiotracer uptake within the anterior inferior aspect of the L3 vertebral body. No corresponding CT abnormality identified. Findings are concerning for osseous metastasis. 5. Stable appearance of presacral soft tissue mass containing fat. Previously characterized as a likely benign myelolipoma. 6. Stable appearance of tracer avid lesion within the distal diaphysis of the right femur. 7. Stable appearance of soft tissue nodule within the medial right thigh at the site of treated melanoma. 8.  Aortic Atherosclerosis (ICD10-I70.0). Electronically Signed   By: Kimberley Penman M.D.   On: 05/27/2023 08:59

## 2023-05-27 ENCOUNTER — Inpatient Hospital Stay: Payer: Medicare Other

## 2023-05-27 ENCOUNTER — Inpatient Hospital Stay: Payer: Medicare Other | Attending: Hematology

## 2023-05-27 ENCOUNTER — Inpatient Hospital Stay: Payer: Medicare Other | Admitting: Hematology

## 2023-05-27 VITALS — BP 143/68 | HR 93 | Temp 98.0°F | Resp 17 | Wt 155.1 lb

## 2023-05-27 VITALS — BP 128/70 | HR 92 | Temp 97.5°F | Resp 18

## 2023-05-27 DIAGNOSIS — Z79620 Long term (current) use of immunosuppressive biologic: Secondary | ICD-10-CM | POA: Diagnosis not present

## 2023-05-27 DIAGNOSIS — Z95828 Presence of other vascular implants and grafts: Secondary | ICD-10-CM

## 2023-05-27 DIAGNOSIS — C4371 Malignant melanoma of right lower limb, including hip: Secondary | ICD-10-CM | POA: Insufficient documentation

## 2023-05-27 DIAGNOSIS — Z5112 Encounter for antineoplastic immunotherapy: Secondary | ICD-10-CM | POA: Insufficient documentation

## 2023-05-27 DIAGNOSIS — C7951 Secondary malignant neoplasm of bone: Secondary | ICD-10-CM | POA: Diagnosis not present

## 2023-05-27 DIAGNOSIS — E039 Hypothyroidism, unspecified: Secondary | ICD-10-CM | POA: Diagnosis not present

## 2023-05-27 DIAGNOSIS — Z8679 Personal history of other diseases of the circulatory system: Secondary | ICD-10-CM | POA: Diagnosis not present

## 2023-05-27 LAB — COMPREHENSIVE METABOLIC PANEL
ALT: 11 U/L (ref 0–44)
AST: 23 U/L (ref 15–41)
Albumin: 3.1 g/dL — ABNORMAL LOW (ref 3.5–5.0)
Alkaline Phosphatase: 92 U/L (ref 38–126)
Anion gap: 8 (ref 5–15)
BUN: 10 mg/dL (ref 8–23)
CO2: 24 mmol/L (ref 22–32)
Calcium: 8.8 mg/dL — ABNORMAL LOW (ref 8.9–10.3)
Chloride: 105 mmol/L (ref 98–111)
Creatinine, Ser: 0.74 mg/dL (ref 0.44–1.00)
GFR, Estimated: >60 mL/min (ref >60)
Glucose, Bld: 119 mg/dL — ABNORMAL HIGH (ref 70–99)
Potassium: 3.6 mmol/L (ref 3.5–5.1)
Sodium: 137 mmol/L (ref 135–145)
Total Bilirubin: 1.1 mg/dL (ref 0.0–1.2)
Total Protein: 6.7 g/dL (ref 6.5–8.1)

## 2023-05-27 LAB — CBC WITH DIFFERENTIAL/PLATELET
Abs Immature Granulocytes: 0.01 10*3/uL (ref 0.00–0.07)
Basophils Absolute: 0 10*3/uL (ref 0.0–0.1)
Basophils Relative: 1 %
Eosinophils Absolute: 0.1 10*3/uL (ref 0.0–0.5)
Eosinophils Relative: 1 %
HCT: 36 % (ref 36.0–46.0)
Hemoglobin: 11.1 g/dL — ABNORMAL LOW (ref 12.0–15.0)
Immature Granulocytes: 0 %
Lymphocytes Relative: 21 %
Lymphs Abs: 1.1 10*3/uL (ref 0.7–4.0)
MCH: 26.7 pg (ref 26.0–34.0)
MCHC: 30.8 g/dL (ref 30.0–36.0)
MCV: 86.5 fL (ref 80.0–100.0)
Monocytes Absolute: 0.4 10*3/uL (ref 0.1–1.0)
Monocytes Relative: 8 %
Neutro Abs: 3.6 10*3/uL (ref 1.7–7.7)
Neutrophils Relative %: 69 %
Platelets: 197 10*3/uL (ref 150–400)
RBC: 4.16 MIL/uL (ref 3.87–5.11)
RDW: 14.9 % (ref 11.5–15.5)
WBC: 5.1 10*3/uL (ref 4.0–10.5)
nRBC: 0 % (ref 0.0–0.2)

## 2023-05-27 LAB — TROPONIN I (HIGH SENSITIVITY): Troponin I (High Sensitivity): 10 ng/L (ref <18)

## 2023-05-27 LAB — MAGNESIUM: Magnesium: 2 mg/dL (ref 1.7–2.4)

## 2023-05-27 LAB — TSH: TSH: 0.196 u[IU]/mL — ABNORMAL LOW (ref 0.350–4.500)

## 2023-05-27 MED ORDER — SODIUM CHLORIDE 0.9 % IV SOLN: Freq: Once | INTRAVENOUS | Status: AC

## 2023-05-27 MED ORDER — FAMOTIDINE IN NACL 20-0.9 MG/50ML-% IV SOLN
20.0000 mg | Freq: Once | INTRAVENOUS | Status: AC
  Administered 2023-05-27: 20 mg via INTRAVENOUS
  Filled 2023-05-27: qty 50

## 2023-05-27 MED ORDER — SODIUM CHLORIDE 0.9 % IV SOLN
200.0000 mg | Freq: Once | INTRAVENOUS | Status: AC
  Administered 2023-05-27: 200 mg via INTRAVENOUS
  Filled 2023-05-27: qty 8

## 2023-05-27 MED ORDER — HEPARIN SOD (PORK) LOCK FLUSH 100 UNIT/ML IV SOLN
500.0000 [IU] | Freq: Once | INTRAVENOUS | Status: AC | PRN
  Administered 2023-05-27: 500 [IU]

## 2023-05-27 MED ORDER — SODIUM CHLORIDE 0.9% FLUSH
10.0000 mL | INTRAVENOUS | Status: DC | PRN
  Administered 2023-05-27: 10 mL via INTRAVENOUS

## 2023-05-27 MED ORDER — CETIRIZINE HCL 10 MG/ML IV SOLN
5.0000 mg | Freq: Once | INTRAVENOUS | Status: AC
  Administered 2023-05-27: 5 mg via INTRAVENOUS
  Filled 2023-05-27: qty 1

## 2023-05-27 MED ORDER — SODIUM CHLORIDE 0.9% FLUSH
10.0000 mL | INTRAVENOUS | Status: DC | PRN
  Administered 2023-05-27: 10 mL

## 2023-05-27 MED ORDER — SODIUM CHLORIDE 0.9 % IV SOLN
70.0000 mg | Freq: Once | INTRAVENOUS | Status: AC
  Administered 2023-05-27: 70 mg via INTRAVENOUS
  Filled 2023-05-27: qty 14

## 2023-05-27 NOTE — Patient Instructions (Addendum)
 [facility] at [facility] Discharge Instructions   You were seen and examined today by Dr. Cheree Cords.  He reviewed the results of your lab work which are normal/stable.   He reviewed the results of your PET scan. The lymph node in the groin have gotten slightly bigger. There is also a spot on the spine, but this is indeterminate. It lit up on the PET part of the scan, but did not show up on the CT scan. There are lymph nodes in the abdomen, but these are likely coming from your recent gallbladder surgery.   We will continue on with treatment with Yervoy  and Keytruda . We will plan to repeat scans in 2-3 months.   Return as scheduled.     Thank you for choosing [facility] at [facility] to provide your oncology and hematology care.  To afford each patient quality time with our provider, please arrive at least 15 minutes before your scheduled appointment time.   If you have a lab appointment with the Cancer Center please come in thru the Main Entrance and check in at the main information desk.  You need to re-schedule your appointment should you arrive 10 or more minutes late.  We strive to give you quality time with our providers, and arriving late affects you and other patients whose appointments are after yours.  Also, if you no show three or more times for appointments you may be dismissed from the clinic at the providers discretion.     Again, thank you for choosing [facility].  Our hope is that these requests will decrease the amount of time that you wait before being seen by our physicians.       _____________________________________________________________  Should you have questions after your visit to [facility], please contact our office at [phone] and follow the prompts.  Our office hours are 8:00 a.m. and 4:30 p.m. Monday - Friday.  Please note that voicemails left after 4:00 p.m. may not be returned  until the following business day.  We are closed weekends and major holidays.  You do have access to a nurse 24-7, just call the main number to the clinic [phone] and do not press any options, hold on the line and a nurse will answer the phone.    For prescription refill requests, have your pharmacy contact our office and allow 72 hours.    Due to Covid, you will need to wear a mask upon entering the hospital. If you do not have a mask, a mask will be given to you at the Main Entrance upon arrival. For doctor visits, patients may have 1 support person age 79 or older with them. For treatment visits, patients can not have anyone with them due to social distancing guidelines and our immunocompromised population.

## 2023-05-27 NOTE — Patient Instructions (Signed)
 [facility] [place] - A DEPT OF [facility]  Discharge Instructions: Thank you for choosing [facility] to provide your oncology and hematology care.  If you have a lab appointment with the Cancer Center - please note that after April 8th, 2024, all labs will be drawn in the cancer center.  You do not have to check in or register with the main entrance as you have in the past but will complete your check-in in the cancer center.  Wear comfortable clothing and clothing appropriate for easy access to any Portacath or PICC line.   We strive to give you quality time with your provider. You may need to reschedule your appointment if you arrive late (15 or more minutes).  Arriving late affects you and other patients whose appointments are after yours.  Also, if you miss three or more appointments without notifying the office, you may be dismissed from the clinic at the provider's discretion.      For prescription refill requests, have your pharmacy contact our office and allow 72 hours for refills to be completed.    Today you received the following chemotherapy and/or immunotherapy agents Keytruda/Yervoy      To help prevent nausea and vomiting after your treatment, we encourage you to take your nausea medication as directed.  BELOW ARE SYMPTOMS THAT SHOULD BE REPORTED IMMEDIATELY: *FEVER GREATER THAN 100.4 F (38 C) OR HIGHER *CHILLS OR SWEATING *NAUSEA AND VOMITING THAT IS NOT CONTROLLED WITH YOUR NAUSEA MEDICATION *UNUSUAL SHORTNESS OF BREATH *UNUSUAL BRUISING OR BLEEDING *URINARY PROBLEMS (pain or burning when urinating, or frequent urination) *BOWEL PROBLEMS (unusual diarrhea, constipation, pain near the anus) TENDERNESS IN MOUTH AND THROAT WITH OR WITHOUT PRESENCE OF ULCERS (sore throat, sores in mouth, or a toothache) UNUSUAL RASH, SWELLING OR PAIN  UNUSUAL VAGINAL DISCHARGE OR ITCHING   Items with * indicate a potential emergency and should be  followed up as soon as possible or go to the Emergency Department if any problems should occur.  Please show the CHEMOTHERAPY ALERT CARD or IMMUNOTHERAPY ALERT CARD at check-in to the Emergency Department and triage nurse.  Should you have questions after your visit or need to cancel or reschedule your appointment, please contact [facility] [place] - A DEPT OF Eligha Bridegroom [facility] [phone]  and follow the prompts.  Office hours are 8:00 a.m. to 4:30 p.m. Monday - Friday. Please note that voicemails left after 4:00 p.m. may not be returned until the following business day.  We are closed weekends and major holidays. You have access to a nurse at all times for urgent questions. Please call the main number to the clinic [phone] and follow the prompts.  For any non-urgent questions, you may also contact your provider using MyChart. We now offer e-Visits for anyone 27 and older to request care online for non-urgent symptoms. For details visit [email or web address].   Also download the MyChart app! Go to the app store, search "MyChart", open the app, select [place], and log in with your MyChart username and password.

## 2023-05-27 NOTE — Progress Notes (Signed)
 New weight 70.4 kg   Dose adjustment for Yervoy  1 mg/kg = 70 mg  Ok to proceed.  V.O. Dr Davina Ester, PharmD

## 2023-05-27 NOTE — Progress Notes (Signed)
 Patient presents today for Keytruda /Yervoy  infusion per providers order.  Vital isgns and labs reviewed by MD.  Message received from Donzell Gallery RN/Dr. Cheree Cords patient okay for treatment.  Ipilimumab  (YERVOY ) Patient Monitoring Assessment   Is the patient experiencing any of the following general symptoms?:  [ n]Difficulty performing normal activities [ n]Feeling sluggish or cold all the time [ n]Unusual weight gain [ n]Constant or unusual headaches [n ]Feeling dizzy or faint [n ]Changes in eyesight (blurry vision, double vision, or other vision problems) [n ]Changes in mood or behavior (ex: decreased sex drive, irritability, or forgetfulness) [ n]Starting new medications (ex: steroids, other medications that lower immune response) [n ]Patient is not experiencing any of the general symptoms above.   Gastrointestinal  Patient is having 1 bowel movements each day.  Is this different from baseline? [ ] Yes [x ]No Are your stools watery or do they have a foul smell? [ ] Yes [ x]No Have you seen blood in your stools? [ ] Yes [ x]No Are your stools dark, tarry, or sticky? [ ] Yes [x ]No Are you having pain or tenderness in your belly? [ ] Yes [ x]No  Skin Does your skin itch? [ ] Yes [x ]No Do you have a rash? [ ] Yes [ x]No Has your skin blistered and/or peeled? [ ] Yes [ x]No Do you have sores in your mouth? [ ] Yes [x ]No  Hepatic Has your urine been dark or tea colored? [ ] Yes [x ]No Have you noticed that your skin or the whites of your eyes are turning yellow? [ ] Yes [x ]No Are you bleeding or bruising more easily than normal? [ ] Yes [x ]No Are you nauseous and/or vomiting? [ ] Yes [ x]No Do you have pain on the right side of your stomach? [ ] Yes [x ]No  Neurologic  Are you having unusual weakness of legs, arms, or face? [ ] Yes [ x]No Are you having numbness or tingling in your hands or feet? [ ] Yes [ x]No  Eino Gravel  Treatment given today per MD orders.  Stable  during infusion without adverse affects.  Vital signs stable.  No complaints at this time.  Discharge from clinic ambulatory in stable condition.  Alert and oriented X 3.  Follow up with [facility] as scheduled.

## 2023-05-28 ENCOUNTER — Other Ambulatory Visit: Payer: Self-pay

## 2023-05-28 LAB — T4: T4, Total: 11.6 ug/dL (ref 4.5–12.0)

## 2023-06-09 DIAGNOSIS — C4371 Malignant melanoma of right lower limb, including hip: Secondary | ICD-10-CM | POA: Diagnosis not present

## 2023-06-16 NOTE — Progress Notes (Signed)
 [facility] [address] [place] [address]    Clinic Day:  06/17/2023  Referring physician: [name] LABOR, MD  Patient Care Team: [name] LABOR, MD as PCP - General (Internal Medicine) Rogers Hai, MD as Medical Oncologist (Medical Oncology) Celestia Joesph SQUIBB, RN as Oncology Nurse Navigator (Oncology)   ASSESSMENT & PLAN:   Assessment: 1. Malignant melanoma of right posterior leg (pT4 pN3 M1): - She noticed fleshy lesion 4 months ago on the right leg. - Her PMD did a biopsy which was consistent with melanoma. - Wide local excision and lymph node biopsy by Dr. Aron on 02/12/2021. - Pathology margins free, nodular type, Breslow's thickness 9.52 mm, Clark level V, deep margins free, ulceration absent, satellitosis absent, mitotic index 2/millimeters squared, LVI negative.  Neurotropism absent.  Tumor infiltrating lymphocytes absent.  Tumor regression not noted.  3 out of 3 positive sentinel lymph nodes for melanoma. - PET CT scan on 03/22/2021 with 3 foci of intense radiotracer activity within the right lower extremity.  In the medial right upper thigh nodule just superficial to thigh musculature measuring 11 mm with SUV 7.6.  Intramedullary hypermetabolic activity within the shaft of the mid right femur with SUV 14.6.  Intense activity associated with condylar notch of the distal right femur, appears to localize to soft tissue rather than the bone.  More diffuse activity associated with the medial right calf related to excision biopsy.  There is a focus of activity adjacent to the lateral margin of the right iliac wing SUV 4.1.  No evidence of visceral metastatic disease in the chest, abdomen or pelvis. 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 - NGS testing-BRAF negative, positive for NRAS  pathogenic variant exon 3, TERT promoter.  MSI-stable.  MMR-proficient.  NTRK 1/2/3 fusion not detected.  KIT mutation negative.  PD-L1 negative. - Nivolumab -Relatlimab  every 28 days started on 05/09/2021.  Held permanently after first dose due to myocarditis. - PET scan on 07/25/2021: Slight progression with no new sites of metastatic disease. - Pembrolizumab  started on 09/03/2021.  XRT to the soft tissue nodule within the medial right thigh and midshaft right femoral lesion from 03/26/2022 through 04/09/2022. -We have discussed further options including adding a low-dose ipilimumab  to pembrolizumab  which she is already receiving without any side effects.  I have quoted response rates around 30% (J Clin Oncol. 2021 Aug 20;39(24):2647-2655. doi: 10.1200/JCO.21.00079).  We also discussed the possibility increased chance of immunotherapy related side effects with combination of PD-1 and anti-CTL A4 antibody. - We also discussed option of binimetinib  as she has NRAS mutation on previous NGS testing.  However this option is also associated with significant side effects and overall response rate of around 20%. - We also discussed other options including adding lenvatinib to pembrolizumab , least favorable option due to lack of robust data.  We have discussed chemotherapy options with dacarbazine/temozolomide among others. - Pembrolizumab  with low-dose ipilimumab  started on 01/14/2023 - Guardant360 (12/2022): NRAS Q61R (?  Binimetinib ), T p53 C17 6Y, TERT promoter SNV, TMB 20.8, MSI high not detected. - PET scan (05/21/2022): Postsurgical changes in the gallbladder bed as well as 2 soft tissue nodules in the right upper quadrant of the abdomen.  Stable distal right femur and soft tissue nodule within the medial right thigh.  New focal area of increased uptake in  the anterior inferior aspect of L3 vertebral body with no CT correlate.  Multiple right-sided pelvic lymph nodes, new from August 2024 PET scan but stable from  November 2020 for CT scan. - She had only received 4 total treatments with pembrolizumab  and low-dose ipilimumab  since September.  PET scan showed mild progression.  Considering alternative treatments available (temozolomide, carboplatin plus paclitaxel), we have decided to continue pembrolizumab  and low-dose ipilimumab  for another 3 cycles and rescan her.  She and her son are agreeable. - Guardant360 (06/09/2023): NRAS Q61R (binimetinib ), PTEN deletion, exon 3-9 (Capivasertib), PTEN deletion exon 1-6 (Capivasertib), T p53, TERT promoter SNV   2. Social/family history: - She is seen with her son today.  She lives by herself at home.  She is independent of ADLs and IADLs.  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  She is non-smoker. - Father had colon cancer.  Sister had breast cancer and colon cancer.  Maternal aunt had breast cancer.  Paternal uncle had colon cancer.    Plan: 1. Malignant melanoma of the right posterior leg (pT4 pN3 M1), BRAF V600 negative: - She has been eating well since gallbladder surgery and gained few pounds. - She did not have any immunotherapy related side effects. - Reviewed labs today: Normal LFTs and creatinine.  CBC grossly normal.  TSH is 0.138. - She may proceed with her next cycle today.  RTC 3 weeks for follow-up.  Will plan on repeating PET CT scan after next treatment.  2.  History of immunotherapy myocarditis: - Troponin is 9 today.  She does not have any chest pains.  She may proceed with treatment.  3.  Right medial thigh pain: - 2 to 3 cm mass in the upper medial right thigh is stable.  No pains noted.  4.  Hypothyroidism: - TSH is 0.138 today.  She is taking Synthroid  125 mcg daily.  Will decrease to 100 mcg daily.  5.  Normocytic anemia: - Hemoglobin dropped to 10.6.  Will check ferritin and iron panel.  If it is low, will give a trial of oral iron therapy.    Orders Placed This Encounter  Procedures    NM PET Image Restage (PS) Whole Body    Standing Status:   Future    Expected Date:   07/30/2023    Expiration Date:   06/16/2024    If indicated for the ordered procedure, I authorize the administration of a radiopharmaceutical per Radiology protocol:   Yes    Preferred imaging location?:   [name]    Release to patient:   Immediate   Iron and TIBC ([facility] ONLY)   Ferritin      I,Helena R Teague,acting as a scribe for Alean Stands, MD.,have documented all relevant documentation on the behalf of Alean Stands, MD,as directed by  Alean Stands, MD while in the presence of Alean Stands, MD.  I, Alean Stands MD, have reviewed the above documentation for accuracy and completeness, and I agree with the above.      Alean Stands, MD   2/5/20251:15 PM  CHIEF COMPLAINT:   Diagnosis: metastatic malignant melanoma, BRAF V600 negative    Cancer Staging  Malignant melanoma of lower leg, right (HCC) Staging form: Melanoma of the Skin, AJCC 8th Edition - Clinical stage from 03/27/2021: Stage IV (cT4a, cN3, cM1) - Unsigned    Prior Therapy: none  Current Therapy:  Pembrolizumab  (200) q21d Keytruda     HISTORY OF PRESENT  ILLNESS:   Oncology History  Malignant melanoma of lower leg, right (HCC)  03/27/2021 Initial Diagnosis   Malignant melanoma of lower leg, right (HCC)   05/09/2021 - 05/09/2021 Chemotherapy   Patient is on Treatment Plan : MELANOMA - Opdualag  (nivolumab /relatlimab ) q28d     09/03/2021 - 01/07/2022 Chemotherapy   Patient is on Treatment Plan : MELANOMA Pembrolizumab  (200) q21d     09/03/2021 -  Chemotherapy   Patient is on Treatment Plan : MELANOMA Pembrolizumab  (200) + Yervoy  (1mg /kg) q21d        INTERVAL HISTORY:   Elizabeth Norman is a 83 y.o. female presenting to clinic today for follow up of metastatic malignant melanoma, BRAF V600 negative. She was last seen by me on 05/27/23.  Since her last visit, she  underwent cholecystectomy on 04/29/23 with Dr. Kallie. She had restaging PET done on 05/22/23.   Today, she states that she is doing well overall. Her appetite level is at 100%. Her energy level is at 75%.  PAST MEDICAL HISTORY:   Past Medical History: Past Medical History:  Diagnosis Date   Arthritis    Hypertension    Malignant melanoma of lower leg, right (HCC) 03/27/2021   Port-A-Cath in place 04/30/2021    Surgical History: Past Surgical History:  Procedure Laterality Date   BIOPSY  04/02/2023   Procedure: BIOPSY;  Surgeon: Aneita Gwendlyn DASEN, MD;  Location: [facility] ENDOSCOPY;  Service: Gastroenterology;;   CATARACT EXTRACTION W/PHACO Right 10/03/2014   Procedure: CATARACT EXTRACTION PHACO AND INTRAOCULAR LENS PLACEMENT (IOC);  Surgeon: Oneil Platts, MD;  Location: [facility] ORS;  Service: Ophthalmology;  Laterality: Right;  CDE:10.09   CATARACT EXTRACTION W/PHACO Left 10/10/2014   Procedure: CATARACT EXTRACTION PHACO AND INTRAOCULAR LENS PLACEMENT (IOC);  Surgeon: Oneil Platts, MD;  Location: [facility] ORS;  Service: Ophthalmology;  Laterality: Left;  CDE:8.69   CORNEAL 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   CORNEAL TRANSPLANT Left 2020   DENTAL RESTORATION/EXTRACTION WITH X-RAY     ERCP N/A 04/02/2023   Procedure: ENDOSCOPIC RETROGRADE CHOLANGIOPANCREATOGRAPHY (ERCP);  Surgeon: Aneita Gwendlyn DASEN, MD;  Location: [facility] ENDOSCOPY;  Service: Gastroenterology;  Laterality: N/A;   IR IMAGING GUIDED PORT INSERTION  04/05/2021   LEFT HEART CATH AND CORONARY ANGIOGRAPHY N/A 05/28/2021   Procedure: LEFT HEART CATH AND CORONARY ANGIOGRAPHY;  Surgeon: Elmira Newman PARAS, MD;  Location: [facility] INVASIVE CV LAB;  Service: Cardiovascular;  Laterality: N/A;   MELANOMA EXCISION Right 02/12/2021   Procedure: WIDE LOCAL EXCISION RIGHT LOWER LEG MELANOMA , ADVANCEMENT FLAP CLOSURE DEFECT;  Surgeon: Aron Shoulders, MD;  Location: [facility] OR;  Service: General;  Laterality: Right;   REMOVAL OF STONES  04/02/2023   Procedure: REMOVAL OF STONES;   Surgeon: Aneita Gwendlyn DASEN, MD;  Location: [facility] ENDOSCOPY;  Service: Gastroenterology;;   MATIAS  04/02/2023   Procedure: MATIAS;  Surgeon: Aneita Gwendlyn DASEN, MD;  Location: [facility] ENDOSCOPY;  Service: Gastroenterology;;   SENTINEL NODE BIOPSY Right 02/12/2021   Procedure: SENTINEL NODE BIOPSY;  Surgeon: Aron Shoulders, MD;  Location: [facility] OR;  Service: General;  Laterality: Right;   SPHINCTEROTOMY  04/02/2023   Procedure: SPHINCTEROTOMY;  Surgeon: Aneita Gwendlyn DASEN, MD;  Location: [facility] ENDOSCOPY;  Service: Gastroenterology;;    Social History: Social History   Socioeconomic History   Marital status: Widowed    Spouse name: Not on file   Number of children: 3   Years of education: Not on file   Highest education level: Not on file  Occupational History   Not on file  Tobacco Use  Smoking status: Never   Smokeless tobacco: Never  Vaping Use   Vaping status: Never Used  Substance and Sexual Activity   Alcohol use: No   Drug use: No   Sexual activity: Not on file  Other Topics Concern   Not on file  Social History Narrative   Not on file   Social Drivers of Health   Financial Resource Strain: Not on file  Food Insecurity: No Food Insecurity (04/29/2023)   Hunger Vital Sign    Worried About Running Out of Food in the Last Year: Never true    Ran Out of Food in the Last Year: Never true  Transportation Needs: No Transportation Needs (04/29/2023)   PRAPARE - Administrator, Civil Service (Medical): No    Lack of Transportation (Non-Medical): No  Physical Activity: Not on file  Stress: Not on file  Social Connections: Not on file  Intimate Partner Violence: Not At Risk (04/29/2023)   Humiliation, Afraid, Rape, and Kick questionnaire    Fear of Current or Ex-Partner: No    Emotionally Abused: No    Physically Abused: No    Sexually Abused: No    Family History: Family History  Problem Relation Age of Onset   Cancer Father    Hypertension Sister     Hypertension Sister     Current Medications:  Current Outpatient Medications:    ALPRAZolam  (XANAX ) 0.5 MG tablet, Take 0.5 mg by mouth 3 (three) times daily., Disp: , Rfl: 2   calcium  carbonate (OSCAL) 1500 (600 Ca) MG TABS tablet, Take 600 mg of elemental calcium  by mouth 3 (three) times a week., Disp: , Rfl:    cholecalciferol (VITAMIN D) 1000 UNITS tablet, Take 1,000 Units by mouth 3 (three) times a week., Disp: , Rfl:    Cyanocobalamin (VITAMIN B-12 PO), Take 1 tablet by mouth once a week., Disp: , Rfl:    diphenhydramine -acetaminophen  (TYLENOL  PM) 25-500 MG TABS tablet, Take 1 tablet by mouth at bedtime as needed., Disp: , Rfl:    prednisoLONE  acetate (PRED FORTE ) 1 % ophthalmic suspension, Place 1 drop into both eyes daily., Disp: , Rfl:    levothyroxine  (SYNTHROID ) 100 MCG tablet, Take 1 tablet (100 mcg total) by mouth daily before breakfast., Disp: 30 tablet, Rfl: 3   metoprolol  tartrate (LOPRESSOR ) 25 MG tablet, Take 1 tablet (25 mg total) by mouth 2 (two) times daily. Hold until follow-up with your doctor/cardiologist (Patient not taking: Reported on 06/17/2023), Disp: , Rfl:    ondansetron  (ZOFRAN -ODT) 4 MG disintegrating tablet, Take 1 tablet (4 mg total) by mouth every 6 (six) hours as needed for nausea. (Patient not taking: Reported on 06/17/2023), Disp: 20 tablet, Rfl: 0 No current facility-administered medications for this visit.  Facility-Administered Medications Ordered in Other Visits:    0.9 %  sodium chloride  infusion, , Intravenous, Once, Bingham Millette, MD   cetirizine  (QUZYTTIR ) injection 5 mg, 5 mg, Intravenous, Once, Lakynn Halvorsen, MD   famotidine  (PEPCID ) IVPB 20 mg premix, 20 mg, Intravenous, Once, Rogers Hai, MD   heparin  lock flush 100 unit/mL, 500 Units, Intracatheter, Once PRN, Darcie Mellone, MD   ipilimumab  (YERVOY ) 80 mg in sodium chloride  0.9 % 50 mL chemo infusion, 1 mg/kg (Treatment Plan Recorded), Intravenous, Once, Kaleigh Spiegelman,  Arieonna Medine, MD   pembrolizumab  (KEYTRUDA ) 200 mg in sodium chloride  0.9 % 50 mL chemo infusion, 200 mg, Intravenous, Once, Rogers Hai, MD   sodium chloride  flush (NS) 0.9 % injection 10 mL, 10 mL, Intracatheter, PRN, Hina Gupta,  Alean, MD   Allergies: Allergies  Allergen Reactions   Tape     Pulls skin, please use paper tape    REVIEW OF SYSTEMS:   Review of Systems  Constitutional:  Positive for fatigue. Negative for chills and fever.  HENT:   Negative for lump/mass, mouth sores, nosebleeds, sore throat and trouble swallowing.   Eyes:  Negative for eye problems.  Respiratory:  Positive for shortness of breath. Negative for cough.   Cardiovascular:  Negative for chest pain, leg swelling and palpitations.  Gastrointestinal:  Positive for constipation. Negative for abdominal pain, diarrhea, nausea and vomiting.  Genitourinary:  Negative for bladder incontinence, difficulty urinating, dysuria, frequency, hematuria and nocturia.   Musculoskeletal:  Negative for arthralgias, back pain, flank pain, myalgias and neck pain.  Skin:  Negative for itching and rash.  Neurological:  Negative for dizziness, headaches and numbness.  Hematological:  Does not bruise/bleed easily.  Psychiatric/Behavioral:  Positive for sleep disturbance. Negative for depression and suicidal ideas. The patient is nervous/anxious.   All other systems reviewed and are negative.    VITALS:   Height 5' 2 (1.575 m), weight 157 lb 10.1 oz (71.5 kg).  Wt Readings from Last 3 Encounters:  06/17/23 157 lb 10.1 oz (71.5 kg)  05/27/23 155 lb 1.6 oz (70.4 kg)  05/07/23 164 lb 3.2 oz (74.5 kg)    Body mass index is 28.83 kg/m.  Performance status (ECOG): 1 - Symptomatic but completely ambulatory  PHYSICAL EXAM:   Physical Exam Vitals and nursing note reviewed. Exam conducted with a chaperone present.  Constitutional:      Appearance: Normal appearance.  Cardiovascular:     Rate and Rhythm: Normal rate  and regular rhythm.     Pulses: Normal pulses.     Heart sounds: Normal heart sounds.  Pulmonary:     Effort: Pulmonary effort is normal.     Breath sounds: Normal breath sounds.  Abdominal:     Palpations: Abdomen is soft. There is no hepatomegaly, splenomegaly or mass.     Tenderness: There is no abdominal tenderness.  Musculoskeletal:     Right lower leg: No edema.     Left lower leg: No edema.  Lymphadenopathy:     Cervical: No cervical adenopathy.     Right cervical: No superficial, deep or posterior cervical adenopathy.    Left cervical: No superficial, deep or posterior cervical adenopathy.     Upper Body:     Right upper body: No supraclavicular or axillary adenopathy.     Left upper body: No supraclavicular or axillary adenopathy.  Neurological:     General: No focal deficit present.     Mental Status: She is alert and oriented to person, place, and time.  Psychiatric:        Mood and Affect: Mood normal.        Behavior: Behavior normal.     LABS:      Latest Ref Rng & Units 06/17/2023   11:44 AM 05/27/2023   11:57 AM 05/07/2023   10:35 AM  CBC  WBC 4.0 - 10.5 K/uL 4.6  5.1  4.3   Hemoglobin 12.0 - 15.0 g/dL 89.3  88.8  89.4   Hematocrit 36.0 - 46.0 % 35.2  36.0  33.9   Platelets 150 - 400 K/uL 196  197  199       Latest Ref Rng & Units 06/17/2023   11:44 AM 05/27/2023   11:57 AM 05/07/2023   10:35 AM  CMP  Glucose 70 - 99 mg/dL 853  880  891   BUN 8 - 23 mg/dL 14  10  6    Creatinine 0.44 - 1.00 mg/dL 9.23  9.25  9.21   Sodium 135 - 145 mmol/L 137  137  135   Potassium 3.5 - 5.1 mmol/L 4.4  3.6  3.8   Chloride 98 - 111 mmol/L 102  105  101   CO2 22 - 32 mmol/L 28  24  26    Calcium  8.9 - 10.3 mg/dL 8.9  8.8  8.1   Total Protein 6.5 - 8.1 g/dL 6.5  6.7  6.1   Total Bilirubin 0.0 - 1.2 mg/dL 0.7  1.1  0.9   Alkaline Phos 38 - 126 U/L 87  92  82   AST 15 - 41 U/L 21  23  21    ALT 0 - 44 U/L 11  11  12       No results found for: CEA1, CEA / No results  found for: CEA1, CEA No results found for: PSA1 No results found for: CAN199 No results found for: CAN125  No results found for: TOTALPROTELP, ALBUMINELP, A1GS, A2GS, BETS, BETA2SER, GAMS, MSPIKE, SPEI No results found for: TIBC, FERRITIN, IRONPCTSAT Lab Results  Component Value Date   LDH 348 (H) 03/30/2023   LDH 137 06/05/2022   LDH 144 05/13/2022     STUDIES:   NM PET Image Restage (PS) Whole Body Result Date: 05/27/2023 CLINICAL DATA:  Subsequent treatment strategy for right lower extremity melanoma. EXAM: NUCLEAR MEDICINE PET WHOLE BODY TECHNIQUE: 8.2 mCi F-18 FDG was injected intravenously. Full-ring PET imaging was performed from the head to foot after the radiotracer. CT data was obtained and used for attenuation correction and anatomic localization. Fasting blood glucose: 131 mg/dl COMPARISON:  MRI 88/79/7975, CT chest, abdomen and pelvis 03/30/2023, and PET-CT 01/01/2023 FINDINGS: Mediastinal blood pool activity: SUV max 2.92 HEAD/NECK: Again seen is a focal area of increased radiotracer uptake in the deep lobe of the right parotid gland with SUV max 4.44. previously 3.5. Incidental CT findings: none CHEST: No hypermetabolic mediastinal or hilar nodes. No suspicious pulmonary nodules on the CT scan. Incidental CT findings: Aortic atherosclerosis and coronary artery calcifications. ABDOMEN/PELVIS: Since the prior exam the patient has undergone interval cholecystectomy. There is increased radiotracer uptake within the gallbladder fossa and in the expected location of the cystic duct remnant with SUV max 13.85, image 123 of the fused PET-CT images. No abnormal tracer activity within the pancreas, spleen, or adrenal glands. No tracer avid lymph nodes within the upper abdomen. Multiple tracer avid right-sided pelvic lymph nodes are identified including proximal right external iliac node measuring 1.6 cm with SUV max of 15.1, image 169/4. On 03/30/2023 this  measured 1.9 cm. This is a new finding compared with the previous PET-CT. Right external iliac lymph node measures 2.7 cm and has an SUV max of 11.88, image 179/4. On 03/30/2023 this measured 2.2 cm. Also new compared with the previous PET-CT. Distal right external iliac lymph node measures 2.6 cm with SUV max of 14.14. On 03/30/2023 this measured 2.7 cm. On the previous PET-CT this measured 1.4 cm with SUV max 8.75. Right inguinal lymph node measures 2.3 cm with SUV max 11.64. On 03/30/2023 this measured 2.1 cm. On the previous PET-CT 9 this measured 2 cm with SUV max of 9.74. Within the right upper quadrant of the abdomen there are 2 soft tissue nodules which are tracer avid, new from the previous PET-CT  in new from the CT from 03/30/2023. Index nodule measures 0.9 cm with SUV max 4.96. More laterally, there is a soft tissue nodule just below the inferior margin of the right lobe of liver measuring 8 mm with SUV max of 6.13, image 130/4. Also new from previous imaging. Presacral soft tissue mass containing fat is again noted measuring 6.8 x 6.2 cm with SUV max of 2.99, image 183/4. Unchanged compared with the prior examination. Incidental CT findings: Status post cholecystectomy. SKELETON: There is a new focal area of increased radiotracer uptake within the anterior inferior aspect of the L3 vertebral body. The SUV max is equal to 7.34., image 148 of the fused PET-CT images. No corresponding CT abnormality identified. Incidental CT findings: Melanoma site within the medial right thigh contains soft tissue nodule EXTREMITIES: Tracer avid lesion within the distal diaphysis of the right femur has an SUV max 3.63, image 275 of the fused PET-CT images. Formally 3.04. Soft tissue nodule within the medial right thigh measures 1.4 cm with SUV max of 4.36. Previously 1.1 cm with SUV max of 5.4. Incidental CT findings: none IMPRESSION: 1. Since the prior exam the patient has undergone interval cholecystectomy. There is  increased radiotracer uptake within the gallbladder fossa and in the expected location of the cystic duct remnant. Findings are nonspecific, but favored to be postoperative in nature. Attention on follow-up imaging is advised. 2. There are 2 soft tissue nodules within the right upper quadrant of the abdomen which are tracer avid, new from the previous PET-CT and new from the CT from 03/30/2023. Indeterminate. Peritoneal metastasis cannot be excluded. However, given recent surgery it is conceivable that these findings may reflect postoperative change. Attention in these areas on future surveillance imaging advised. 3. Multiple tracer avid right-sided pelvic lymph nodes are identified compatible with nodal metastasis. These are new compared with the previous PET-CT from 01/01/2023. Compared with the more recent CT from 03/30/2023 these nodes are overall similar in size as detailed above. 4. There is a new focal area of increased radiotracer uptake within the anterior inferior aspect of the L3 vertebral body. No corresponding CT abnormality identified. Findings are concerning for osseous metastasis. 5. Stable appearance of presacral soft tissue mass containing fat. Previously characterized as a likely benign myelolipoma. 6. Stable appearance of tracer avid lesion within the distal diaphysis of the right femur. 7. Stable appearance of soft tissue nodule within the medial right thigh at the site of treated melanoma. 8.  Aortic Atherosclerosis (ICD10-I70.0). Electronically Signed   By: Waddell Calk M.D.   On: 05/27/2023 08:59

## 2023-06-17 ENCOUNTER — Inpatient Hospital Stay: Payer: Medicare Other

## 2023-06-17 ENCOUNTER — Inpatient Hospital Stay: Payer: Medicare Other | Attending: Hematology

## 2023-06-17 ENCOUNTER — Inpatient Hospital Stay: Payer: Medicare Other | Admitting: Hematology

## 2023-06-17 VITALS — Ht 62.0 in | Wt 157.6 lb

## 2023-06-17 VITALS — BP 148/72 | HR 95 | Temp 97.1°F | Resp 19

## 2023-06-17 DIAGNOSIS — E039 Hypothyroidism, unspecified: Secondary | ICD-10-CM | POA: Insufficient documentation

## 2023-06-17 DIAGNOSIS — C4371 Malignant melanoma of right lower limb, including hip: Secondary | ICD-10-CM

## 2023-06-17 DIAGNOSIS — Z9049 Acquired absence of other specified parts of digestive tract: Secondary | ICD-10-CM | POA: Diagnosis not present

## 2023-06-17 DIAGNOSIS — Z95828 Presence of other vascular implants and grafts: Secondary | ICD-10-CM

## 2023-06-17 DIAGNOSIS — Z8679 Personal history of other diseases of the circulatory system: Secondary | ICD-10-CM | POA: Insufficient documentation

## 2023-06-17 DIAGNOSIS — Z79620 Long term (current) use of immunosuppressive biologic: Secondary | ICD-10-CM | POA: Diagnosis not present

## 2023-06-17 DIAGNOSIS — Z79890 Hormone replacement therapy (postmenopausal): Secondary | ICD-10-CM | POA: Insufficient documentation

## 2023-06-17 DIAGNOSIS — M79651 Pain in right thigh: Secondary | ICD-10-CM | POA: Insufficient documentation

## 2023-06-17 DIAGNOSIS — D649 Anemia, unspecified: Secondary | ICD-10-CM | POA: Insufficient documentation

## 2023-06-17 DIAGNOSIS — Z5112 Encounter for antineoplastic immunotherapy: Secondary | ICD-10-CM | POA: Insufficient documentation

## 2023-06-17 DIAGNOSIS — D508 Other iron deficiency anemias: Secondary | ICD-10-CM

## 2023-06-17 DIAGNOSIS — C7951 Secondary malignant neoplasm of bone: Secondary | ICD-10-CM | POA: Diagnosis not present

## 2023-06-17 DIAGNOSIS — Z803 Family history of malignant neoplasm of breast: Secondary | ICD-10-CM | POA: Insufficient documentation

## 2023-06-17 DIAGNOSIS — Z800 Family history of malignant neoplasm of digestive organs: Secondary | ICD-10-CM | POA: Diagnosis not present

## 2023-06-17 LAB — CBC WITH DIFFERENTIAL/PLATELET
Abs Immature Granulocytes: 0.01 10*3/uL (ref 0.00–0.07)
Basophils Absolute: 0 10*3/uL (ref 0.0–0.1)
Basophils Relative: 1 %
Eosinophils Absolute: 0.1 10*3/uL (ref 0.0–0.5)
Eosinophils Relative: 2 %
HCT: 35.2 % — ABNORMAL LOW (ref 36.0–46.0)
Hemoglobin: 10.6 g/dL — ABNORMAL LOW (ref 12.0–15.0)
Immature Granulocytes: 0 %
Lymphocytes Relative: 25 %
Lymphs Abs: 1.1 10*3/uL (ref 0.7–4.0)
MCH: 25.9 pg — ABNORMAL LOW (ref 26.0–34.0)
MCHC: 30.1 g/dL (ref 30.0–36.0)
MCV: 86.1 fL (ref 80.0–100.0)
Monocytes Absolute: 0.3 10*3/uL (ref 0.1–1.0)
Monocytes Relative: 8 %
Neutro Abs: 3 10*3/uL (ref 1.7–7.7)
Neutrophils Relative %: 64 %
Platelets: 196 10*3/uL (ref 150–400)
RBC: 4.09 MIL/uL (ref 3.87–5.11)
RDW: 14.5 % (ref 11.5–15.5)
WBC: 4.6 10*3/uL (ref 4.0–10.5)
nRBC: 0 % (ref 0.0–0.2)

## 2023-06-17 LAB — FERRITIN: Ferritin: 20 ng/mL (ref 11–307)

## 2023-06-17 LAB — COMPREHENSIVE METABOLIC PANEL
ALT: 11 U/L (ref 0–44)
AST: 21 U/L (ref 15–41)
Albumin: 3.2 g/dL — ABNORMAL LOW (ref 3.5–5.0)
Alkaline Phosphatase: 87 U/L (ref 38–126)
Anion gap: 7 (ref 5–15)
BUN: 14 mg/dL (ref 8–23)
CO2: 28 mmol/L (ref 22–32)
Calcium: 8.9 mg/dL (ref 8.9–10.3)
Chloride: 102 mmol/L (ref 98–111)
Creatinine, Ser: 0.76 mg/dL (ref 0.44–1.00)
GFR, Estimated: >60 mL/min (ref >60)
Glucose, Bld: 146 mg/dL — ABNORMAL HIGH (ref 70–99)
Potassium: 4.4 mmol/L (ref 3.5–5.1)
Sodium: 137 mmol/L (ref 135–145)
Total Bilirubin: 0.7 mg/dL (ref 0.0–1.2)
Total Protein: 6.5 g/dL (ref 6.5–8.1)

## 2023-06-17 LAB — IRON AND TIBC
Iron: 31 ug/dL (ref 28–170)
Saturation Ratios: 10 % — ABNORMAL LOW (ref 10.4–31.8)
TIBC: 312 ug/dL (ref 250–450)
UIBC: 281 ug/dL

## 2023-06-17 LAB — TROPONIN I (HIGH SENSITIVITY): Troponin I (High Sensitivity): 9 ng/L (ref <18)

## 2023-06-17 LAB — TSH: TSH: 0.138 u[IU]/mL — ABNORMAL LOW (ref 0.350–4.500)

## 2023-06-17 LAB — MAGNESIUM: Magnesium: 1.9 mg/dL (ref 1.7–2.4)

## 2023-06-17 MED ORDER — SODIUM CHLORIDE 0.9% FLUSH
10.0000 mL | Freq: Once | INTRAVENOUS | Status: AC
  Administered 2023-06-17: 10 mL

## 2023-06-17 MED ORDER — SODIUM CHLORIDE 0.9 % IV SOLN
70.0000 mg | Freq: Once | INTRAVENOUS | Status: AC
  Administered 2023-06-17: 70 mg via INTRAVENOUS
  Filled 2023-06-17: qty 14

## 2023-06-17 MED ORDER — FAMOTIDINE IN NACL 20-0.9 MG/50ML-% IV SOLN
20.0000 mg | Freq: Once | INTRAVENOUS | Status: AC
  Administered 2023-06-17: 20 mg via INTRAVENOUS
  Filled 2023-06-17: qty 50

## 2023-06-17 MED ORDER — SODIUM CHLORIDE 0.9 % IV SOLN
200.0000 mg | Freq: Once | INTRAVENOUS | Status: AC
  Administered 2023-06-17: 200 mg via INTRAVENOUS
  Filled 2023-06-17: qty 8

## 2023-06-17 MED ORDER — SODIUM CHLORIDE 0.9 % IV SOLN: Freq: Once | INTRAVENOUS | Status: AC

## 2023-06-17 MED ORDER — LEVOTHYROXINE SODIUM 100 MCG PO TABS: 100.0000 ug | ORAL_TABLET | Freq: Every day | ORAL | 3 refills | Status: DC

## 2023-06-17 MED ORDER — CETIRIZINE HCL 10 MG/ML IV SOLN
5.0000 mg | Freq: Once | INTRAVENOUS | Status: AC
  Administered 2023-06-17: 5 mg via INTRAVENOUS
  Filled 2023-06-17: qty 1

## 2023-06-17 MED ORDER — HEPARIN SOD (PORK) LOCK FLUSH 100 UNIT/ML IV SOLN
500.0000 [IU] | Freq: Once | INTRAVENOUS | Status: AC | PRN
  Administered 2023-06-17: 500 [IU]

## 2023-06-17 MED ORDER — SODIUM CHLORIDE 0.9% FLUSH
10.0000 mL | INTRAVENOUS | Status: DC | PRN
  Administered 2023-06-17: 10 mL

## 2023-06-17 NOTE — Progress Notes (Signed)
Ipilimumab (YERVOY) Patient Monitoring Assessment   Is the patient experiencing any of the following general symptoms?:  []Difficulty performing normal activities []Feeling sluggish or cold all the time []Unusual weight gain []Constant or unusual headaches []Feeling dizzy or faint []Changes in eyesight (blurry vision, double vision, or other vision problems) []Changes in mood or behavior (ex: decreased sex drive, irritability, or forgetfulness) []Starting new medications (ex: steroids, other medications that lower immune response) [x]Patient is not experiencing any of the general symptoms above.    Gastrointestinal  Patient is having normal bowel movements each day.  Is this different from baseline? []Yes [x]No Are your stools watery or do they have a foul smell? []Yes [x]No Have you seen blood in your stools? []Yes [x]No Are your stools dark, tarry, or sticky? []Yes [x]No Are you having pain or tenderness in your belly? []Yes [x]No  Skin Does your skin itch? []Yes [x]No Do you have a rash? []Yes [x]No Has your skin blistered and/or peeled? []Yes [x]No Do you have sores in your mouth? []Yes [x]No  Hepatic Has your urine been dark or tea colored? []Yes [x]No Have you noticed that your skin or the whites of your eyes are turning yellow? []Yes [x]No Are you bleeding or bruising more easily than normal? []Yes [x]No Are you nauseous and/or vomiting? []Yes [x]No Do you have pain on the right side of your stomach? []Yes [x]No  Neurologic  Are you having unusual weakness of legs, arms, or face? []Yes [x]No Are you having numbness or tingling in your hands or feet? []Yes [x]No  Elizabeth Norman B [place]   Patient tolerated therapy with no complaints voiced.  Side effects with management reviewed with understanding verbalized.  Port site clean and dry with no bruising or swelling noted at site.  Good blood return noted before and after administration of therapy.  Band aid applied.  Patient  left in satisfactory condition with VSS and no s/s of distress noted.  

## 2023-06-17 NOTE — Progress Notes (Signed)
 Patient has been examined by Dr. Ellin Saba. Vital signs and labs have been reviewed by MD - ANC, Creatinine, LFTs, hemoglobin, and platelets are within treatment parameters per M.D. - pt may proceed with treatment.  Primary RN and pharmacy notified.

## 2023-06-17 NOTE — Patient Instructions (Signed)
 [facility] - A DEPT OF [facility]  Discharge Instructions: Thank you for choosing [facility] to provide your oncology and hematology care.  If you have a lab appointment with the Cancer Center - please note that after April 8th, 2024, all labs will be drawn in the cancer center.  You do not have to check in or register with the main entrance as you have in the past but will complete your check-in in the cancer center.  Wear comfortable clothing and clothing appropriate for easy access to any Portacath or PICC line.   We strive to give you quality time with your provider. You may need to reschedule your appointment if you arrive late (15 or more minutes).  Arriving late affects you and other patients whose appointments are after yours.  Also, if you miss three or more appointments without notifying the office, you may be dismissed from the clinic at the provider's discretion.      For prescription refill requests, have your pharmacy contact our office and allow 72 hours for refills to be completed.    Today you received the following chemotherapy and/or immunotherapy agents keytruda , yervoy       To help prevent nausea and vomiting after your treatment, we encourage you to take your nausea medication as directed.  BELOW ARE SYMPTOMS THAT SHOULD BE REPORTED IMMEDIATELY: *FEVER GREATER THAN 100.4 F (38 C) OR HIGHER *CHILLS OR SWEATING *NAUSEA AND VOMITING THAT IS NOT CONTROLLED WITH YOUR NAUSEA MEDICATION *UNUSUAL SHORTNESS OF BREATH *UNUSUAL BRUISING OR BLEEDING *URINARY PROBLEMS (pain or burning when urinating, or frequent urination) *BOWEL PROBLEMS (unusual diarrhea, constipation, pain near the anus) TENDERNESS IN MOUTH AND THROAT WITH OR WITHOUT PRESENCE OF ULCERS (sore throat, sores in mouth, or a toothache) UNUSUAL RASH, SWELLING OR PAIN  UNUSUAL VAGINAL DISCHARGE OR ITCHING   Items with * indicate a potential emergency and should be  followed up as soon as possible or go to the Emergency Department if any problems should occur.  Please show the CHEMOTHERAPY ALERT CARD or IMMUNOTHERAPY ALERT CARD at check-in to the Emergency Department and triage nurse.  Should you have questions after your visit or need to cancel or reschedule your appointment, please contact [facility] [place] - A DEPT OF [facility] [phone]  and follow the prompts.  Office hours are 8:00 a.m. to 4:30 p.m. Monday - Friday. Please note that voicemails left after 4:00 p.m. may not be returned until the following business day.  We are closed weekends and major holidays. You have access to a nurse at all times for urgent questions. Please call the main number to the clinic [phone] and follow the prompts.  For any non-urgent questions, you may also contact your provider using MyChart. We now offer e-Visits for anyone 44 and older to request care online for non-urgent symptoms. For details visit [email or web address].   Also download the MyChart app! Go to the app store, search MyChart, open the app, select [facility], and log in with your MyChart username and password.

## 2023-06-17 NOTE — Patient Instructions (Signed)

## 2023-06-18 ENCOUNTER — Other Ambulatory Visit: Payer: Medicare Other

## 2023-06-18 ENCOUNTER — Ambulatory Visit: Payer: Medicare Other

## 2023-06-18 LAB — T4: T4, Total: 11 ug/dL (ref 4.5–12.0)

## 2023-06-24 ENCOUNTER — Encounter: Payer: Self-pay | Admitting: Cardiology

## 2023-06-24 ENCOUNTER — Ambulatory Visit: Payer: Medicare Other | Attending: Cardiology | Admitting: Cardiology

## 2023-06-24 VITALS — BP 123/77 | HR 99 | Resp 16 | Ht 62.0 in | Wt 153.0 lb

## 2023-06-24 DIAGNOSIS — T50905D Adverse effect of unspecified drugs, medicaments and biological substances, subsequent encounter: Secondary | ICD-10-CM | POA: Diagnosis not present

## 2023-06-24 DIAGNOSIS — I514 Myocarditis, unspecified: Secondary | ICD-10-CM

## 2023-06-24 NOTE — Patient Instructions (Signed)
  Follow-Up: At [facility], you and your health needs are our priority.  As part of our continuing mission to provide you with exceptional heart care, we have created designated Provider Care Teams.  These Care Teams include your primary Cardiologist (physician) and Advanced Practice Providers (APPs -  Physician Assistants and Nurse Practitioners) who all work together to provide you with the care you need, when you need it.   Your next appointment:   1 year(s)  Provider:   [name], MD     Other Instructions   1st Floor: - Lobby - Registration  - Pharmacy  - Lab - Cafe  2nd Floor: - PV Lab - Diagnostic Testing (echo, CT, nuclear med)  3rd Floor: - Vacant  4th Floor: - TCTS (cardiothoracic surgery) - AFib Clinic - Structural Heart Clinic - Vascular Surgery  - Vascular Ultrasound  5th Floor: - HeartCare Cardiology (general and EP) - Clinical Pharmacy for coumadin, hypertension, lipid, weight-loss medications, and med management appointments    Valet parking services will be available as well.

## 2023-06-24 NOTE — Progress Notes (Signed)
  Cardiology Office Note:  .   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    Elizabeth Norman is a 83 y.o. 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  She is doing well, denies any complains of chest pain or shortness breath.  Heart rate and blood pressure fairly well-controlled without metoprolol.    Vitals:   06/24/23 1011  BP: 123/77  Pulse: 99  Resp: 16  SpO2: 96%     ROS:  Review of Systems  Cardiovascular:  Negative for chest pain, dyspnea on exertion, leg swelling, palpitations and syncope.     Studies Reviewed: Marland Kitchen        No new studies reviewed today.   Physical Exam:   Physical Exam Vitals and nursing note reviewed.  Constitutional:      General: She is not in acute distress. Neck:     Vascular: No JVD.  Cardiovascular:     Rate and Rhythm: Normal rate and regular rhythm.     Heart sounds: Normal heart sounds. No murmur heard. Pulmonary:     Effort: Pulmonary effort is normal.     Breath sounds: Normal breath sounds. No wheezing or rales.  Musculoskeletal:     Right lower leg: No edema.     Left lower leg: No edema.      VISIT DIAGNOSES:   ICD-10-CM   1. 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    Elizabeth Norman is a 83 y.o. 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 Continue management as per Dr. Ellin Saba.  Hypertension: Well controlled without medications. Okay to  stay off metoprolol.   F/u in 1 year  Signed, [name], MD

## 2023-06-30 DIAGNOSIS — E7849 Other hyperlipidemia: Secondary | ICD-10-CM | POA: Diagnosis not present

## 2023-06-30 DIAGNOSIS — I10 Essential (primary) hypertension: Secondary | ICD-10-CM | POA: Diagnosis not present

## 2023-06-30 DIAGNOSIS — Z0000 Encounter for general adult medical examination without abnormal findings: Secondary | ICD-10-CM | POA: Diagnosis not present

## 2023-06-30 DIAGNOSIS — N182 Chronic kidney disease, stage 2 (mild): Secondary | ICD-10-CM | POA: Diagnosis not present

## 2023-06-30 DIAGNOSIS — C439 Malignant melanoma of skin, unspecified: Secondary | ICD-10-CM | POA: Diagnosis not present

## 2023-06-30 DIAGNOSIS — E038 Other specified hypothyroidism: Secondary | ICD-10-CM | POA: Diagnosis not present

## 2023-06-30 DIAGNOSIS — M818 Other osteoporosis without current pathological fracture: Secondary | ICD-10-CM | POA: Diagnosis not present

## 2023-07-08 ENCOUNTER — Inpatient Hospital Stay: Payer: Medicare Other | Admitting: Hematology

## 2023-07-08 ENCOUNTER — Inpatient Hospital Stay: Payer: Medicare Other

## 2023-07-08 VITALS — BP 120/65 | HR 86 | Temp 97.0°F | Resp 18

## 2023-07-08 VITALS — BP 118/69 | HR 88 | Temp 97.5°F | Resp 20 | Wt 154.8 lb

## 2023-07-08 DIAGNOSIS — C4371 Malignant melanoma of right lower limb, including hip: Secondary | ICD-10-CM

## 2023-07-08 DIAGNOSIS — M79651 Pain in right thigh: Secondary | ICD-10-CM | POA: Diagnosis not present

## 2023-07-08 DIAGNOSIS — Z8679 Personal history of other diseases of the circulatory system: Secondary | ICD-10-CM | POA: Diagnosis not present

## 2023-07-08 DIAGNOSIS — E039 Hypothyroidism, unspecified: Secondary | ICD-10-CM | POA: Diagnosis not present

## 2023-07-08 DIAGNOSIS — Z5112 Encounter for antineoplastic immunotherapy: Secondary | ICD-10-CM | POA: Diagnosis not present

## 2023-07-08 DIAGNOSIS — Z803 Family history of malignant neoplasm of breast: Secondary | ICD-10-CM | POA: Diagnosis not present

## 2023-07-08 DIAGNOSIS — Z95828 Presence of other vascular implants and grafts: Secondary | ICD-10-CM

## 2023-07-08 DIAGNOSIS — Z9049 Acquired absence of other specified parts of digestive tract: Secondary | ICD-10-CM | POA: Diagnosis not present

## 2023-07-08 DIAGNOSIS — C7951 Secondary malignant neoplasm of bone: Secondary | ICD-10-CM | POA: Diagnosis not present

## 2023-07-08 DIAGNOSIS — D649 Anemia, unspecified: Secondary | ICD-10-CM | POA: Diagnosis not present

## 2023-07-08 DIAGNOSIS — Z800 Family history of malignant neoplasm of digestive organs: Secondary | ICD-10-CM | POA: Diagnosis not present

## 2023-07-08 DIAGNOSIS — Z79620 Long term (current) use of immunosuppressive biologic: Secondary | ICD-10-CM | POA: Diagnosis not present

## 2023-07-08 LAB — COMPREHENSIVE METABOLIC PANEL
ALT: 12 U/L (ref 0–44)
AST: 23 U/L (ref 15–41)
Albumin: 3.3 g/dL — ABNORMAL LOW (ref 3.5–5.0)
Alkaline Phosphatase: 97 U/L (ref 38–126)
Anion gap: 10 (ref 5–15)
BUN: 13 mg/dL (ref 8–23)
CO2: 25 mmol/L (ref 22–32)
Calcium: 9 mg/dL (ref 8.9–10.3)
Chloride: 101 mmol/L (ref 98–111)
Creatinine, Ser: 0.82 mg/dL (ref 0.44–1.00)
GFR, Estimated: >60 mL/min (ref >60)
Glucose, Bld: 199 mg/dL — ABNORMAL HIGH (ref 70–99)
Potassium: 3.9 mmol/L (ref 3.5–5.1)
Sodium: 136 mmol/L (ref 135–145)
Total Bilirubin: 0.8 mg/dL (ref 0.0–1.2)
Total Protein: 6.6 g/dL (ref 6.5–8.1)

## 2023-07-08 LAB — CBC WITH DIFFERENTIAL/PLATELET
Abs Immature Granulocytes: 0.01 10*3/uL (ref 0.00–0.07)
Basophils Absolute: 0 10*3/uL (ref 0.0–0.1)
Basophils Relative: 1 %
Eosinophils Absolute: 0.1 10*3/uL (ref 0.0–0.5)
Eosinophils Relative: 2 %
HCT: 34.9 % — ABNORMAL LOW (ref 36.0–46.0)
Hemoglobin: 10.7 g/dL — ABNORMAL LOW (ref 12.0–15.0)
Immature Granulocytes: 0 %
Lymphocytes Relative: 25 %
Lymphs Abs: 1 10*3/uL (ref 0.7–4.0)
MCH: 25.9 pg — ABNORMAL LOW (ref 26.0–34.0)
MCHC: 30.7 g/dL (ref 30.0–36.0)
MCV: 84.5 fL (ref 80.0–100.0)
Monocytes Absolute: 0.3 10*3/uL (ref 0.1–1.0)
Monocytes Relative: 8 %
Neutro Abs: 2.5 10*3/uL (ref 1.7–7.7)
Neutrophils Relative %: 64 %
Platelets: 194 10*3/uL (ref 150–400)
RBC: 4.13 MIL/uL (ref 3.87–5.11)
RDW: 14.5 % (ref 11.5–15.5)
WBC: 3.9 10*3/uL — ABNORMAL LOW (ref 4.0–10.5)
nRBC: 0 % (ref 0.0–0.2)

## 2023-07-08 LAB — MAGNESIUM: Magnesium: 1.8 mg/dL (ref 1.7–2.4)

## 2023-07-08 LAB — TROPONIN I (HIGH SENSITIVITY): Troponin I (High Sensitivity): 9 ng/L (ref <18)

## 2023-07-08 LAB — TSH: TSH: 0.445 u[IU]/mL (ref 0.350–4.500)

## 2023-07-08 MED ORDER — SODIUM CHLORIDE 0.9% FLUSH
10.0000 mL | INTRAVENOUS | Status: DC | PRN
  Administered 2023-07-08: 10 mL

## 2023-07-08 MED ORDER — CETIRIZINE HCL 10 MG/ML IV SOLN
5.0000 mg | Freq: Once | INTRAVENOUS | Status: AC
  Administered 2023-07-08: 5 mg via INTRAVENOUS
  Filled 2023-07-08: qty 1

## 2023-07-08 MED ORDER — FAMOTIDINE IN NACL 20-0.9 MG/50ML-% IV SOLN
20.0000 mg | Freq: Once | INTRAVENOUS | Status: AC
  Administered 2023-07-08: 20 mg via INTRAVENOUS
  Filled 2023-07-08: qty 50

## 2023-07-08 MED ORDER — HEPARIN SOD (PORK) LOCK FLUSH 100 UNIT/ML IV SOLN
500.0000 [IU] | Freq: Once | INTRAVENOUS | Status: AC | PRN
  Administered 2023-07-08: 500 [IU]

## 2023-07-08 MED ORDER — SODIUM CHLORIDE 0.9 % IV SOLN
Freq: Once | INTRAVENOUS | Status: AC
Start: 2023-07-08 — End: 2023-07-08

## 2023-07-08 MED ORDER — SODIUM CHLORIDE 0.9 % IV SOLN
70.0000 mg | Freq: Once | INTRAVENOUS | Status: AC
  Administered 2023-07-08: 70 mg via INTRAVENOUS
  Filled 2023-07-08: qty 14

## 2023-07-08 MED ORDER — SODIUM CHLORIDE FLUSH 0.9 % IV SOLN
10.0000 mL | Freq: Once | INTRAVENOUS | Status: AC
  Administered 2023-07-08: 10 mL via INTRAVENOUS
  Filled 2023-07-08: qty 10

## 2023-07-08 MED ORDER — SODIUM CHLORIDE 0.9 % IV SOLN
200.0000 mg | Freq: Once | INTRAVENOUS | Status: AC
  Administered 2023-07-08: 200 mg via INTRAVENOUS
  Filled 2023-07-08: qty 8

## 2023-07-08 NOTE — Patient Instructions (Signed)

## 2023-07-08 NOTE — Progress Notes (Signed)
 [facility] [address], [place] [address]    Clinic Day:  07/08/2023  Referring physician: [name], MD  Patient Care Team: [name], MD as PCP - General (Internal Medicine) [name], MD as PCP - Cardiology (Cardiology) [name], MD as Medical Oncologist (Medical Oncology) [name], RN as Oncology Nurse Navigator (Oncology)   ASSESSMENT & PLAN:   Assessment: 1. Malignant melanoma of right posterior leg (pT4 pN3 M1): - She noticed fleshy lesion 4 months ago on the right leg. - Her PMD did a biopsy which was consistent with melanoma. - Wide local excision and lymph node biopsy by Dr. Donell Beers on 02/12/2021. - Pathology margins free, nodular type, Breslow's thickness 9.52 mm, Clark level V, deep margins free, ulceration absent, satellitosis absent, mitotic index 2/millimeters squared, LVI negative.  Neurotropism absent.  Tumor infiltrating lymphocytes absent.  Tumor regression not noted.  3 out of 3 positive sentinel lymph nodes for melanoma. - PET CT scan on 03/22/2021 with 3 foci of intense radiotracer activity within the right lower extremity.  In the medial right upper thigh nodule just superficial to thigh musculature measuring 11 mm with SUV 7.6.  Intramedullary hypermetabolic activity within the shaft of the mid right femur with SUV 14.6.  Intense activity associated with condylar notch of the distal right femur, appears to localize to soft tissue rather than the bone.  More diffuse activity associated with the medial right calf related to excision biopsy.  There is a focus of activity adjacent to the lateral margin of the right iliac wing SUV 4.1.  No evidence of visceral metastatic disease in the chest, abdomen or pelvis. - MRI of the right hip and right knee from 04/16/2021 with solitary bone metastasis in the distal right femoral diaphysis and multiple nodal metastatic disease anteromedially in the proximal right mid  thigh. - NGS testing-BRAF negative, positive for NRAS pathogenic variant exon 3, TERT promoter.  MSI-stable.  MMR-proficient.  NTRK 1/2/3 fusion not detected.  KIT mutation negative.  PD-L1 negative. - Nivolumab-Relatlimab every 28 days started on 05/09/2021.  Held permanently after first dose due to myocarditis. - PET scan on 07/25/2021: Slight progression with no new sites of metastatic disease. - Pembrolizumab started on 09/03/2021.  XRT to the soft tissue nodule within the medial right thigh and midshaft right femoral lesion from 03/26/2022 through 04/09/2022. -We have discussed further options including adding a low-dose ipilimumab to pembrolizumab which she is already receiving without any side effects.  I have quoted response rates around 30% (J Clin Oncol. 2021 Aug 20;39(24):2647-2655. doi: 10.1200/JCO.21.00079).  We also discussed the possibility increased chance of immunotherapy related side effects with combination of PD-1 and anti-CTL A4 antibody. - We also discussed option of binimetinib as she has NRAS mutation on previous NGS testing.  However this option is also associated with significant side effects and overall response rate of around 20%. - We also discussed other options including adding lenvatinib to pembrolizumab, least favorable option due to lack of robust data.  We have discussed chemotherapy options with dacarbazine/temozolomide among others. - Pembrolizumab with low-dose ipilimumab started on 01/14/2023 - Guardant360 (12/2022): NRAS Q61R (?  Binimetinib), T p53 C17 6Y, TERT promoter SNV, TMB 20.8, MSI high not detected. - PET scan (05/21/2022): Postsurgical changes in the gallbladder bed as well as 2 soft tissue nodules in the right upper quadrant of the abdomen.  Stable distal right femur and soft tissue nodule within the medial right  thigh.  New focal area of increased uptake in the anterior inferior aspect of L3 vertebral body with no CT correlate.  Multiple right-sided pelvic  lymph nodes, new from August 2024 PET scan but stable from November 2020 for CT scan. - She had only received 4 total treatments with pembrolizumab and low-dose ipilimumab since September.  PET scan showed mild progression.  Considering alternative treatments available (temozolomide, carboplatin plus paclitaxel), we have decided to continue pembrolizumab and low-dose ipilimumab for another 3 cycles and rescan her.  She and her son are agreeable. - Guardant360 (06/09/2023): NRAS Q61R (binimetinib), PTEN deletion, exon 3-9 (Capivasertib), PTEN deletion exon 1-6 (Capivasertib), T p53, TERT promoter SNV   2. Social/family history: - She is seen with her son today.  She lives by herself at home.  She is independent of ADLs and IADLs.  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  She is non-smoker. - Father had colon cancer.  Sister had breast cancer and colon cancer.  Maternal aunt had breast cancer.  Paternal uncle had colon cancer.    Plan: 1. Malignant melanoma of the right posterior leg (pT4 pN3 M1), BRAF V600 negative: - She does not report any immunotherapy related side effects. - Last PET CT scan showed minor progression.  We will obtain PET CT scan after this treatment (cycle 3). - Reviewed labs today: Normal LFTs and creatinine.  After lites are normal.  CBC grossly within normal limits. - RTC 3 weeks with PET scan.  2.  History of immunotherapy myocarditis: - She does not have any chest pains.  Troponin is 9.  3.  Right medial thigh pain: - 2 to 3 cm mass in the upper medial right thigh is stable.  She reports occasional sharp pain.  4.  Hypothyroidism: - Continue Synthroid 125 mcg daily.  TSH today 0.4.  5.  Normocytic anemia: - Hemoglobin is 10.7.  Ferritin is 20 and percent saturation 10. - Recommend that she start taking iron tablet daily on Monday, Wednesday and Friday.  Use stool softener as needed.    No orders of the defined types were  placed in this encounter.       [name], MD   2/26/202511:50 AM  CHIEF COMPLAINT:   Diagnosis: metastatic malignant melanoma, BRAF V600 negative    Cancer Staging  Malignant melanoma of lower leg, right (HCC) Staging form: Melanoma of the Skin, AJCC 8th Edition - Clinical stage from 03/27/2021: Stage IV (cT4a, cN3, cM1) - Unsigned    Prior Therapy: none  Current Therapy:  Pembrolizumab (200) q21d Keytruda    HISTORY OF PRESENT ILLNESS:   Oncology History  Malignant melanoma of lower leg, right (HCC)  03/27/2021 Initial Diagnosis   Malignant melanoma of lower leg, right (HCC)   05/09/2021 - 05/09/2021 Chemotherapy   Patient is on Treatment Plan : MELANOMA - Opdualag (nivolumab/relatlimab) q28d     09/03/2021 - 01/07/2022 Chemotherapy   Patient is on Treatment Plan : MELANOMA Pembrolizumab (200) q21d     09/03/2021 -  Chemotherapy   Patient is on Treatment Plan : MELANOMA Pembrolizumab (200) + Yervoy (1mg /kg) q21d        INTERVAL HISTORY:   Elizabeth Norman is a 83 y.o. female seen for follow-up of metastatic malignant melanoma.  She reports appetite of 100% and energy levels of 70%.  Reports constipation has been stable.  PAST MEDICAL HISTORY:   Past Medical History: Past Medical History:  Diagnosis Date   Arthritis  Hypertension    Malignant melanoma of lower leg, right (HCC) 03/27/2021   Port-A-Cath in place 04/30/2021    Surgical History: Past Surgical History:  Procedure Laterality Date   BIOPSY  04/02/2023   Procedure: BIOPSY;  Surgeon: Meryl Dare, MD;  Location: [facility] ENDOSCOPY;  Service: Gastroenterology;;   CATARACT EXTRACTION W/PHACO Right 10/03/2014   Procedure: CATARACT EXTRACTION PHACO AND INTRAOCULAR LENS PLACEMENT (IOC);  Surgeon: Jethro Bolus, MD;  Location: [facility] ORS;  Service: Ophthalmology;  Laterality: Right;  CDE:10.09   CATARACT EXTRACTION W/PHACO Left 10/10/2014   Procedure: CATARACT EXTRACTION PHACO AND INTRAOCULAR LENS PLACEMENT  (IOC);  Surgeon: Jethro Bolus, MD;  Location: [facility] ORS;  Service: Ophthalmology;  Laterality: Left;  CDE:8.69   CORNEAL 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   CORNEAL TRANSPLANT Left 2020   DENTAL RESTORATION/EXTRACTION WITH X-RAY     ERCP N/A 04/02/2023   Procedure: ENDOSCOPIC RETROGRADE CHOLANGIOPANCREATOGRAPHY (ERCP);  Surgeon: Meryl Dare, MD;  Location: [facility] ENDOSCOPY;  Service: Gastroenterology;  Laterality: N/A;   IR IMAGING GUIDED PORT INSERTION  04/05/2021   LEFT HEART CATH AND CORONARY ANGIOGRAPHY N/A 05/28/2021   Procedure: LEFT HEART CATH AND CORONARY ANGIOGRAPHY;  Surgeon: [name], MD;  Location: [facility] INVASIVE CV LAB;  Service: Cardiovascular;  Laterality: N/A;   MELANOMA EXCISION Right 02/12/2021   Procedure: WIDE LOCAL EXCISION RIGHT LOWER LEG MELANOMA , ADVANCEMENT FLAP CLOSURE DEFECT;  Surgeon: Almond Lint, MD;  Location: [facility] OR;  Service: General;  Laterality: Right;   REMOVAL OF STONES  04/02/2023   Procedure: REMOVAL OF STONES;  Surgeon: Meryl Dare, MD;  Location: [facility] ENDOSCOPY;  Service: Gastroenterology;;   Susa Day  04/02/2023   Procedure: Susa Day;  Surgeon: Meryl Dare, MD;  Location: [facility] ENDOSCOPY;  Service: Gastroenterology;;   SENTINEL NODE BIOPSY Right 02/12/2021   Procedure: SENTINEL NODE BIOPSY;  Surgeon: Almond Lint, MD;  Location: [facility] OR;  Service: General;  Laterality: Right;   SPHINCTEROTOMY  04/02/2023   Procedure: SPHINCTEROTOMY;  Surgeon: Meryl Dare, MD;  Location: [facility] ENDOSCOPY;  Service: Gastroenterology;;    Social History: Social History   Socioeconomic History   Marital status: Widowed    Spouse name: Not on file   Number of children: 3   Years of education: Not on file   Highest education level: Not on file  Occupational History   Not on file  Tobacco Use   Smoking status: Never   Smokeless tobacco: Never  Vaping Use   Vaping status: Never Used  Substance and Sexual Activity   Alcohol use: No   Drug use: No    Sexual activity: Not on file  Other Topics Concern   Not on file  Social History Narrative   Not on file   Social Drivers of Health   Financial Resource Strain: Not on file  Food Insecurity: No Food Insecurity (04/29/2023)   Hunger Vital Sign    Worried About Running Out of Food in the Last Year: Never true    Ran Out of Food in the Last Year: Never true  Transportation Needs: No Transportation Needs (04/29/2023)   PRAPARE - Administrator, Civil Service (Medical): No    Lack of Transportation (Non-Medical): No  Physical Activity: Not on file  Stress: Not on file  Social Connections: Not on file  Intimate Partner Violence: Not At Risk (04/29/2023)   Humiliation, Afraid, Rape, and Kick questionnaire    Fear of Current or Ex-Partner: No    Emotionally Abused: No  Physically Abused: No    Sexually Abused: No    Family History: Family History  Problem Relation Age of Onset   Cancer Father    Hypertension Sister    Hypertension Sister     Current Medications:  Current Outpatient Medications:    ALPRAZolam (XANAX) 0.5 MG tablet, Take 0.5 mg by mouth 3 (three) times daily., Disp: , Rfl: 2   levothyroxine (SYNTHROID) 100 MCG tablet, Take 1 tablet (100 mcg total) by mouth daily before breakfast., Disp: 30 tablet, Rfl: 3   calcium carbonate (OSCAL) 1500 (600 Ca) MG TABS tablet, Take 600 mg of elemental calcium by mouth 3 (three) times a week. (Patient not taking: Reported on 07/08/2023), Disp: , Rfl:    cholecalciferol (VITAMIN D) 1000 UNITS tablet, Take 1,000 Units by mouth 3 (three) times a week. (Patient not taking: Reported on 07/08/2023), Disp: , Rfl:    Cyanocobalamin (VITAMIN B-12 PO), Take 1 tablet by mouth once a week. (Patient not taking: Reported on 07/08/2023), Disp: , Rfl:    diphenhydramine-acetaminophen (TYLENOL PM) 25-500 MG TABS tablet, Take 1 tablet by mouth at bedtime as needed. (Patient not taking: Reported on 07/08/2023), Disp: , Rfl:    metoprolol  tartrate (LOPRESSOR) 25 MG tablet, Take 1 tablet (25 mg total) by mouth 2 (two) times daily. Hold until follow-up with your doctor/cardiologist (Patient not taking: Reported on 06/17/2023), Disp: , Rfl:    ondansetron (ZOFRAN-ODT) 4 MG disintegrating tablet, Take 1 tablet (4 mg total) by mouth every 6 (six) hours as needed for nausea. (Patient not taking: Reported on 07/08/2023), Disp: 20 tablet, Rfl: 0   prednisoLONE acetate (PRED FORTE) 1 % ophthalmic suspension, Place 1 drop into both eyes daily. (Patient not taking: Reported on 07/08/2023), Disp: , Rfl:    Allergies: Allergies  Allergen Reactions   Tape     Pulls skin, please use paper tape    REVIEW OF SYSTEMS:   Review of Systems  Constitutional:  Negative for chills and fever.  HENT:   Negative for lump/mass, mouth sores, nosebleeds, sore throat and trouble swallowing.   Eyes:  Negative for eye problems.  Respiratory:  Positive for shortness of breath. Negative for cough.   Cardiovascular:  Negative for chest pain, leg swelling and palpitations.  Gastrointestinal:  Positive for constipation. Negative for abdominal pain, diarrhea, nausea and vomiting.  Genitourinary:  Negative for bladder incontinence, difficulty urinating, dysuria, frequency, hematuria and nocturia.   Musculoskeletal:  Negative for arthralgias, back pain, flank pain, myalgias and neck pain.  Skin:  Negative for itching and rash.  Neurological:  Positive for headaches. Negative for dizziness and numbness.  Hematological:  Does not bruise/bleed easily.  Psychiatric/Behavioral:  Positive for sleep disturbance. Negative for depression and suicidal ideas.   All other systems reviewed and are negative.    VITALS:   Blood pressure 118/69, pulse 88, temperature (!) 97.5 F (36.4 C), temperature source Tympanic, resp. rate 20, weight 154 lb 12.2 oz (70.2 kg), SpO2 100%.  Wt Readings from Last 3 Encounters:  07/08/23 154 lb 12.2 oz (70.2 kg)  06/24/23 153 lb (69.4 kg)   06/17/23 157 lb 10.1 oz (71.5 kg)    Body mass index is 28.31 kg/m.  Performance status (ECOG): 1 - Symptomatic but completely ambulatory  PHYSICAL EXAM:   Physical Exam Vitals and nursing note reviewed. Exam conducted with a chaperone present.  Constitutional:      Appearance: Normal appearance.  Cardiovascular:     Rate and Rhythm: Normal rate and  regular rhythm.     Pulses: Normal pulses.     Heart sounds: Normal heart sounds.  Pulmonary:     Effort: Pulmonary effort is normal.     Breath sounds: Normal breath sounds.  Abdominal:     Palpations: Abdomen is soft. There is no hepatomegaly, splenomegaly or mass.     Tenderness: There is no abdominal tenderness.  Musculoskeletal:     Right lower leg: No edema.     Left lower leg: No edema.  Lymphadenopathy:     Cervical: No cervical adenopathy.     Right cervical: No superficial, deep or posterior cervical adenopathy.    Left cervical: No superficial, deep or posterior cervical adenopathy.     Upper Body:     Right upper body: No supraclavicular or axillary adenopathy.     Left upper body: No supraclavicular or axillary adenopathy.  Neurological:     General: No focal deficit present.     Mental Status: She is alert and oriented to person, place, and time.  Psychiatric:        Mood and Affect: Mood normal.        Behavior: Behavior normal.     LABS:      Latest Ref Rng & Units 07/08/2023   10:40 AM 06/17/2023   11:44 AM 05/27/2023   11:57 AM  CBC  WBC 4.0 - 10.5 K/uL 3.9  4.6  5.1   Hemoglobin 12.0 - 15.0 g/dL 96.0  45.4  09.8   Hematocrit 36.0 - 46.0 % 34.9  35.2  36.0   Platelets 150 - 400 K/uL 194  196  197       Latest Ref Rng & Units 07/08/2023   10:40 AM 06/17/2023   11:44 AM 05/27/2023   11:57 AM  CMP  Glucose 70 - 99 mg/dL 119  147  829   BUN 8 - 23 mg/dL 13  14  10    Creatinine 0.44 - 1.00 mg/dL 5.62  1.30  8.65   Sodium 135 - 145 mmol/L 136  137  137   Potassium 3.5 - 5.1 mmol/L 3.9  4.4  3.6    Chloride 98 - 111 mmol/L 101  102  105   CO2 22 - 32 mmol/L 25  28  24    Calcium 8.9 - 10.3 mg/dL 9.0  8.9  8.8   Total Protein 6.5 - 8.1 g/dL 6.6  6.5  6.7   Total Bilirubin 0.0 - 1.2 mg/dL 0.8  0.7  1.1   Alkaline Phos 38 - 126 U/L 97  87  92   AST 15 - 41 U/L 23  21  23    ALT 0 - 44 U/L 12  11  11       No results found for: "CEA1", "CEA" / No results found for: "CEA1", "CEA" No results found for: "PSA1" No results found for: "HQI696" No results found for: "CAN125"  No results found for: "TOTALPROTELP", "ALBUMINELP", "A1GS", "A2GS", "BETS", "BETA2SER", "GAMS", "MSPIKE", "SPEI" Lab Results  Component Value Date   TIBC 312 06/17/2023   FERRITIN 20 06/17/2023   IRONPCTSAT 10 (L) 06/17/2023   Lab Results  Component Value Date   LDH 348 (H) 03/30/2023   LDH 137 06/05/2022   LDH 144 05/13/2022     STUDIES:   No results found.

## 2023-07-08 NOTE — Progress Notes (Signed)
 Ipilimumab (YERVOY) Patient Monitoring Assessment   Is the patient experiencing any of the following general symptoms?:  [ ] Difficulty performing normal activities [ ] Feeling sluggish or cold all the time [ ] Unusual weight gain [ ] Constant or unusual headaches [ ] Feeling dizzy or faint [ ] Changes in eyesight (blurry vision, double vision, or other vision problems) [ ] Changes in mood or behavior (ex: decreased sex drive, irritability, or forgetfulness) [ ] Starting new medications (ex: steroids, other medications that lower immune response) [ X]Patient is not experiencing any of the general symptoms above.   Gastrointestinal  Patient is having 1 bowel movements each day.  Is this different from baseline? [ ] Yes [X ]No Are your stools watery or do they have a foul smell? [ ] Yes [ X]No Have you seen blood in your stools? [ ] Yes [ X]No Are your stools dark, tarry, or sticky? [ ] Yes [ X]No Are you having pain or tenderness in your belly? [ ] Yes [ X]No  Skin Does your skin itch? [ ] Yes [X ]No Do you have a rash? [ ] Yes [ X]No Has your skin blistered and/or peeled? [ ] Yes [ X]No Do you have sores in your mouth? [ ] Yes [ X]No  Hepatic Has your urine been dark or tea colored? [ ] Yes [ X]No Have you noticed that your skin or the whites of your eyes are turning yellow? [ ] Yes [X ]No Are you bleeding or bruising more easily than normal? [ ] Yes [ X]No Are you nauseous and/or vomiting? [ ] Yes [ X]No Do you have pain on the right side of your stomach? [ ] Yes [ X]No  Neurologic  Are you having unusual weakness of legs, arms, or face? [ ] Yes [ X]No Are you having numbness or tingling in your hands or feet? [ ] Yes [X ]No  Peggye Pitt

## 2023-07-08 NOTE — Progress Notes (Signed)
 Patient tolerated treatment well with no complaints voiced.  Patient left ambulatory in stable condition.  Vital signs stable at discharge.  Follow up as scheduled.

## 2023-07-08 NOTE — Patient Instructions (Signed)
 [facility] [place] - A DEPT OF [facility]  Discharge Instructions: Thank you for choosing [facility] to provide your oncology and hematology care.  If you have a lab appointment with the Cancer Center - please note that after April 8th, 2024, all labs will be drawn in the cancer center.  You do not have to check in or register with the main entrance as you have in the past but will complete your check-in in the cancer center.  Wear comfortable clothing and clothing appropriate for easy access to any Portacath or PICC line.   We strive to give you quality time with your provider. You may need to reschedule your appointment if you arrive late (15 or more minutes).  Arriving late affects you and other patients whose appointments are after yours.  Also, if you miss three or more appointments without notifying the office, you may be dismissed from the clinic at the provider's discretion.      For prescription refill requests, have your pharmacy contact our office and allow 72 hours for refills to be completed.    Today you received the following chemotherapy and/or immunotherapy agents Keytruda/Yervoy.  Pembrolizumab Injection What is this medication? PEMBROLIZUMAB (PEM broe LIZ ue mab) treats some types of cancer. It works by helping your immune system slow or stop the spread of cancer cells. It is a monoclonal antibody. This medicine may be used for other purposes; ask your health care provider or pharmacist if you have questions. COMMON BRAND NAME(S): Keytruda What should I tell my care team before I take this medication? They need to know if you have any of these conditions: Allogeneic stem cell transplant (uses someone else's stem cells) Autoimmune diseases, such as Crohn disease, ulcerative colitis, lupus History of chest radiation Nervous system problems, such as Guillain-Barre syndrome, myasthenia gravis Organ transplant An unusual or allergic  reaction to pembrolizumab, other medications, foods, dyes, or preservatives Pregnant or trying to get pregnant Breast-feeding How should I use this medication? This medication is injected into a vein. It is given by your care team in a hospital or clinic setting. A special MedGuide will be given to you before each treatment. Be sure to read this information carefully each time. Talk to your care team about the use of this medication in children. While it may be prescribed for children as young as 6 months for selected conditions, precautions do apply. Overdosage: If you think you have taken too much of this medicine contact a poison control center or emergency room at once. NOTE: This medicine is only for you. Do not share this medicine with others. What if I miss a dose? Keep appointments for follow-up doses. It is important not to miss your dose. Call your care team if you are unable to keep an appointment. What may interact with this medication? Interactions have not been studied. This list may not describe all possible interactions. Give your health care provider a list of all the medicines, herbs, non-prescription drugs, or dietary supplements you use. Also tell them if you smoke, drink alcohol, or use illegal drugs. Some items may interact with your medicine. What should I watch for while using this medication? Your condition will be monitored carefully while you are receiving this medication. You may need blood work while taking this medication. This medication may cause serious skin reactions. They can happen weeks to months after starting the medication. Contact your care team right away if you  notice fevers or flu-like symptoms with a rash. The rash may be red or purple and then turn into blisters or peeling of the skin. You may also notice a red rash with swelling of the face, lips, or lymph nodes in your neck or under your arms. Tell your care team right away if you have any change in  your eyesight. Talk to your care team if you may be pregnant. Serious birth defects can occur if you take this medication during pregnancy and for 4 months after the last dose. You will need a negative pregnancy test before starting this medication. Contraception is recommended while taking this medication and for 4 months after the last dose. Your care team can help you find the option that works for you. Do not breastfeed while taking this medication and for 4 months after the last dose. What side effects may I notice from receiving this medication? Side effects that you should report to your care team as soon as possible: Allergic reactions--skin rash, itching, hives, swelling of the face, lips, tongue, or throat Dry cough, shortness of breath or trouble breathing Eye pain, redness, irritation, or discharge with blurry or decreased vision Heart muscle inflammation--unusual weakness or fatigue, shortness of breath, chest pain, fast or irregular heartbeat, dizziness, swelling of the ankles, feet, or hands Hormone gland problems--headache, sensitivity to light, unusual weakness or fatigue, dizziness, fast or irregular heartbeat, increased sensitivity to cold or heat, excessive sweating, constipation, hair loss, increased thirst or amount of urine, tremors or shaking, irritability Infusion reactions--chest pain, shortness of breath or trouble breathing, feeling faint or lightheaded Kidney injury (glomerulonephritis)--decrease in the amount of urine, red or dark Satchel Heidinger urine, foamy or bubbly urine, swelling of the ankles, hands, or feet Liver injury--right upper belly pain, loss of appetite, nausea, light-colored stool, dark yellow or Barbi Kumagai urine, yellowing skin or eyes, unusual weakness or fatigue Pain, tingling, or numbness in the hands or feet, muscle weakness, change in vision, confusion or trouble speaking, loss of balance or coordination, trouble walking, seizures Rash, fever, and swollen lymph  nodes Redness, blistering, peeling, or loosening of the skin, including inside the mouth Sudden or severe stomach pain, bloody diarrhea, fever, nausea, vomiting Side effects that usually do not require medical attention (report to your care team if they continue or are bothersome): Bone, joint, or muscle pain Diarrhea Fatigue Loss of appetite Nausea Skin rash This list may not describe all possible side effects. Call your doctor for medical advice about side effects. You may report side effects to FDA at 1-800-FDA-1088. Where should I keep my medication? This medication is given in a hospital or clinic. It will not be stored at home. NOTE: This sheet is a summary. It may not cover all possible information. If you have questions about this medicine, talk to your doctor, pharmacist, or health care provider.  2024 Elsevier/Gold Standard (2021-09-10 00:00:00)       To help prevent nausea and vomiting after your treatment, we encourage you to take your nausea medication as directed.  BELOW ARE SYMPTOMS THAT SHOULD BE REPORTED IMMEDIATELY: *FEVER GREATER THAN 100.4 F (38 C) OR HIGHER *CHILLS OR SWEATING *NAUSEA AND VOMITING THAT IS NOT CONTROLLED WITH YOUR NAUSEA MEDICATION *UNUSUAL SHORTNESS OF BREATH *UNUSUAL BRUISING OR BLEEDING *URINARY PROBLEMS (pain or burning when urinating, or frequent urination) *BOWEL PROBLEMS (unusual diarrhea, constipation, pain near the anus) TENDERNESS IN MOUTH AND THROAT WITH OR WITHOUT PRESENCE OF ULCERS (sore throat, sores in mouth, or a toothache) UNUSUAL RASH,  SWELLING OR PAIN  UNUSUAL VAGINAL DISCHARGE OR ITCHING   Items with * indicate a potential emergency and should be followed up as soon as possible or go to the Emergency Department if any problems should occur.  Please show the CHEMOTHERAPY ALERT CARD or IMMUNOTHERAPY ALERT CARD at check-in to the Emergency Department and triage nurse.  Should you have questions after your visit or need to  cancel or reschedule your appointment, please contact [facility] [place] - A DEPT OF Eligha Bridegroom [facility] [phone]  and follow the prompts.  Office hours are 8:00 a.m. to 4:30 p.m. Monday - Friday. Please note that voicemails left after 4:00 p.m. may not be returned until the following business day.  We are closed weekends and major holidays. You have access to a nurse at all times for urgent questions. Please call the main number to the clinic [phone] and follow the prompts.  For any non-urgent questions, you may also contact your provider using MyChart. We now offer e-Visits for anyone 33 and older to request care online for non-urgent symptoms. For details visit [email or web address].   Also download the MyChart app! Go to the app store, search "MyChart", open the app, select [facility], and log in with your MyChart username and password.

## 2023-07-08 NOTE — Progress Notes (Signed)
 Patient presents today for chemotherapy infusion. Patient is in satisfactory condition with no new complaints voiced.  Vital signs are stable.  Labs reviewed by Dr. Ellin Saba during the office visit and all labs are within treatment parameters.  We will proceed with treatment per MD orders.

## 2023-07-09 LAB — T4: T4, Total: 9.2 ug/dL (ref 4.5–12.0)

## 2023-07-23 ENCOUNTER — Ambulatory Visit (HOSPITAL_COMMUNITY)
Admission: RE | Admit: 2023-07-23 | Discharge: 2023-07-23 | Disposition: A | Discharge status: Home / Self Care | Payer: Medicare Other | Source: Ambulatory Visit | Attending: Hematology | Admitting: Hematology

## 2023-07-23 DIAGNOSIS — C4371 Malignant melanoma of right lower limb, including hip: Secondary | ICD-10-CM | POA: Diagnosis not present

## 2023-07-23 DIAGNOSIS — C7951 Secondary malignant neoplasm of bone: Secondary | ICD-10-CM | POA: Diagnosis not present

## 2023-07-23 MED ORDER — FLUDEOXYGLUCOSE F - 18 (FDG) INJECTION
7.2000 | Freq: Once | INTRAVENOUS | Status: AC | PRN
  Administered 2023-07-23: 7.2 via INTRAVENOUS

## 2023-07-28 NOTE — Progress Notes (Signed)
 [name] [address] [place] [address]    Clinic Day:  07/29/2023  Referring physician: [name], MD  Patient Care Team: [name], MD as PCP - General (Internal Medicine) [name], MD as PCP - Cardiology (Cardiology) [name], MD as Medical Oncologist (Medical Oncology) [name], RN as Oncology Nurse Navigator (Oncology)   ASSESSMENT & PLAN:   Assessment: 1. Malignant melanoma of right posterior leg (pT4 pN3 M1): - She noticed fleshy lesion 4 months ago on the right leg. - Her PMD did a biopsy which was consistent with melanoma. - Wide local excision and lymph node biopsy by Dr. Donell Beers on 02/12/2021. - Pathology margins free, nodular type, Breslow's thickness 9.52 mm, Clark level V, deep margins free, ulceration absent, satellitosis absent, mitotic index 2/millimeters squared, LVI negative.  Neurotropism absent.  Tumor infiltrating lymphocytes absent.  Tumor regression not noted.  3 out of 3 positive sentinel lymph nodes for melanoma. - PET CT scan on 03/22/2021 with 3 foci of intense radiotracer activity within the right lower extremity.  In the medial right upper thigh nodule just superficial to thigh musculature measuring 11 mm with SUV 7.6.  Intramedullary hypermetabolic activity within the shaft of the mid right femur with SUV 14.6.  Intense activity associated with condylar notch of the distal right femur, appears to localize to soft tissue rather than the bone.  More diffuse activity associated with the medial right calf related to excision biopsy.  There is a focus of activity adjacent to the lateral margin of the right iliac wing SUV 4.1.  No evidence of visceral metastatic disease in the chest, abdomen or pelvis. - MRI of the right hip and right knee from 04/16/2021 with solitary bone metastasis in the distal right femoral diaphysis and multiple nodal metastatic disease anteromedially in the proximal right mid  thigh. - NGS testing-BRAF negative, positive for NRAS pathogenic variant exon 3, TERT promoter.  MSI-stable.  MMR-proficient.  NTRK 1/2/3 fusion not detected.  KIT mutation negative.  PD-L1 negative. - Nivolumab-Relatlimab every 28 days started on 05/09/2021.  Held permanently after first dose due to myocarditis. - PET scan on 07/25/2021: Slight progression with no new sites of metastatic disease. - Pembrolizumab started on 09/03/2021.  XRT to the soft tissue nodule within the medial right thigh and midshaft right femoral lesion from 03/26/2022 through 04/09/2022. -We have discussed further options including adding a low-dose ipilimumab to pembrolizumab which she is already receiving without any side effects.  I have quoted response rates around 30% (J Clin Oncol. 2021 Aug 20;39(24):2647-2655. doi: 10.1200/JCO.21.00079).  We also discussed the possibility increased chance of immunotherapy related side effects with combination of PD-1 and anti-CTL A4 antibody. - We also discussed option of binimetinib as she has NRAS mutation on previous NGS testing.  However this option is also associated with significant side effects and overall response rate of around 20%. - We also discussed other options including adding lenvatinib to pembrolizumab, least favorable option due to lack of robust data.  We have discussed chemotherapy options with dacarbazine/temozolomide among others. - Pembrolizumab with low-dose ipilimumab started on 01/14/2023 - Guardant360 (12/2022): NRAS Q61R (?  Binimetinib), T p53 C17 6Y, TERT promoter SNV, TMB 20.8, MSI high not detected. - PET scan (05/21/2022): Postsurgical changes in the gallbladder bed as well as 2 soft tissue nodules in the right upper quadrant of the abdomen.  Stable distal right femur and soft tissue nodule within the medial right  thigh.  New focal area of increased uptake in the anterior inferior aspect of L3 vertebral body with no CT correlate.  Multiple right-sided pelvic  lymph nodes, new from August 2024 PET scan but stable from November 2020 for CT scan. - She had only received 4 total treatments with pembrolizumab and low-dose ipilimumab since September.  PET scan showed mild progression.  Considering alternative treatments available (temozolomide, carboplatin plus paclitaxel), we have decided to continue pembrolizumab and low-dose ipilimumab for another 3 cycles and rescan her.  She and her son are agreeable. - Guardant360 (06/09/2023): NRAS Q61R (binimetinib), PTEN deletion, exon 3-9 (Capivasertib), PTEN deletion exon 1-6 (Capivasertib), T p53, TERT promoter SNV   2. Social/family history: - She is seen with her son today.  She lives by herself at home.  She is independent of ADLs and IADLs.  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  She is non-smoker. - Father had colon cancer.  Sister had breast cancer and colon cancer.  Maternal aunt had breast cancer.  Paternal uncle had colon cancer.    Plan: 1. Malignant melanoma of the right posterior leg (pT4 pN3 M1), BRAF V600 negative: - She is tolerating immunotherapy reasonably well. - We reviewed PET scan (07/23/2023): Mixed response with enlarging hypermetabolic right inguinal lymph node and small new L5 metastasis.  Stable/decreased hypermetabolic involving right upper quadrant peritoneal nodules, right iliac adenopathy, L5 and right femoral metastasis and treated soft tissue lesion in the medial right thigh. - We had a prolonged discussion about further treatment options.  We talked about further options including Abraxane (days 1, 8, 15 every 28 days) and binimetinib.  Due to the adverse side effect profile particularly cardiac, not inclined towards benefit.  She is not keen on chemotherapy at this time.  Will continue immunotherapy.  Will seek opinion from radiation oncology to see if they can radiate the right groin lymph node as she has oligo progression. - She is  agreeable.  Will consider repeating scan in 3-4 cycles.  She may proceed with her treatment today.  RTC 6 weeks for follow-up.  2.  History of immunotherapy myocarditis: - She does not have any cardiac symptoms.  Troponin level is 12.  3.  Right medial thigh pain: - On and off sharp pain is stable.  PET scan shows hypermetabolic nodule medial right thigh 11 mm stable in size and hypermetabolic 1.  4.  Hypothyroidism: - Continue Synthroid 125 mcg daily.  TSH is 1.3.   5.  Normocytic anemia: - Continue iron tablet Monday Wednesday and Friday.  Use stool softener as needed.  Hemoglobin is better today at 11.1.    Orders Placed This Encounter  Procedures   Magnesium    Standing Status:   Future    Expected Date:   08/19/2023    Expiration Date:   08/18/2024   CBC with Differential    Standing Status:   Future    Expected Date:   08/19/2023    Expiration Date:   08/19/2024   Comprehensive metabolic panel    Standing Status:   Future    Expected Date:   08/19/2023    Expiration Date:   08/19/2024   T4    Standing Status:   Future    Expected Date:   08/19/2023    Expiration Date:   08/19/2024   TSH    Standing Status:   Future    Expected Date:   08/19/2023  Expiration Date:   08/19/2024   Magnesium    Standing Status:   Future    Expected Date:   09/09/2023    Expiration Date:   09/08/2024   CBC with Differential    Standing Status:   Future    Expected Date:   09/09/2023    Expiration Date:   09/09/2024   Comprehensive metabolic panel    Standing Status:   Future    Expected Date:   09/09/2023    Expiration Date:   09/09/2024   T4    Standing Status:   Future    Expected Date:   09/09/2023    Expiration Date:   09/09/2024   TSH    Standing Status:   Future    Expected Date:   09/09/2023    Expiration Date:   09/09/2024   Magnesium    Standing Status:   Future    Expected Date:   09/30/2023    Expiration Date:   09/29/2024   CBC with Differential    Standing Status:   Future    Expected  Date:   09/30/2023    Expiration Date:   09/30/2024   Comprehensive metabolic panel    Standing Status:   Future    Expected Date:   09/30/2023    Expiration Date:   09/30/2024   T4    Standing Status:   Future    Expected Date:   09/30/2023    Expiration Date:   09/30/2024   TSH    Standing Status:   Future    Expected Date:   09/30/2023    Expiration Date:   09/30/2024      I,[name],acting as a scribe for [name], MD.,have documented all relevant documentation on the behalf of [name], MD,as directed by  [name], MD while in the presence of [name], MD.   I, [name] MD, have reviewed the above documentation for accuracy and completeness, and I agree with the above.   [name], MD   3/19/202512:41 PM  CHIEF COMPLAINT:   Diagnosis: metastatic malignant melanoma, BRAF V600 negative    Cancer Staging  Malignant melanoma of lower leg, right (HCC) Staging form: Melanoma of the Skin, AJCC 8th Edition - Clinical stage from 03/27/2021: Stage IV (cT4a, cN3, cM1) - Unsigned    Prior Therapy: none  Current Therapy:  Pembrolizumab (200) q21d Keytruda    HISTORY OF PRESENT ILLNESS:   Oncology History  Malignant melanoma of lower leg, right (HCC)  03/27/2021 Initial Diagnosis   Malignant melanoma of lower leg, right (HCC)   05/09/2021 - 05/09/2021 Chemotherapy   Patient is on Treatment Plan : MELANOMA - Opdualag (nivolumab/relatlimab) q28d     09/03/2021 - 01/07/2022 Chemotherapy   Patient is on Treatment Plan : MELANOMA Pembrolizumab (200) q21d     09/03/2021 -  Chemotherapy   Patient is on Treatment Plan : MELANOMA Pembrolizumab (200) + Yervoy (1mg /kg) q21d        INTERVAL HISTORY:   Celestia is a 83 y.o. female presenting to clinic today for follow up of metastatic malignant melanoma, BRAF V600 negative. She was last seen by me on 07/08/23.  Since her last visit, she underwent restaging PET scan on  07/23/23.  Today, she states that she is doing well overall. Her appetite level is at 100%. Her energy level is at 100%.  PAST MEDICAL HISTORY:   Past Medical History: Past Medical History:  Diagnosis Date   Arthritis  Hypertension    Malignant melanoma of lower leg, right (HCC) 03/27/2021   Port-A-Cath in place 04/30/2021    Surgical History: Past Surgical History:  Procedure Laterality Date   BIOPSY  04/02/2023   Procedure: BIOPSY;  Surgeon: Meryl Dare, MD;  Location: [facility] ENDOSCOPY;  Service: Gastroenterology;;   CATARACT EXTRACTION W/PHACO Right 10/03/2014   Procedure: CATARACT EXTRACTION PHACO AND INTRAOCULAR LENS PLACEMENT (IOC);  Surgeon: Jethro Bolus, MD;  Location: [facility] ORS;  Service: Ophthalmology;  Laterality: Right;  CDE:10.09   CATARACT EXTRACTION W/PHACO Left 10/10/2014   Procedure: CATARACT EXTRACTION PHACO AND INTRAOCULAR LENS PLACEMENT (IOC);  Surgeon: Jethro Bolus, MD;  Location: [facility] ORS;  Service: Ophthalmology;  Laterality: Left;  CDE:8.69   CORNEAL 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   CORNEAL TRANSPLANT Left 2020   DENTAL RESTORATION/EXTRACTION WITH X-RAY     ERCP N/A 04/02/2023   Procedure: ENDOSCOPIC RETROGRADE CHOLANGIOPANCREATOGRAPHY (ERCP);  Surgeon: Meryl Dare, MD;  Location: [facility] ENDOSCOPY;  Service: Gastroenterology;  Laterality: N/A;   IR IMAGING GUIDED PORT INSERTION  04/05/2021   LEFT HEART CATH AND CORONARY ANGIOGRAPHY N/A 05/28/2021   Procedure: LEFT HEART CATH AND CORONARY ANGIOGRAPHY;  Surgeon: [name], MD;  Location: [facility] INVASIVE CV LAB;  Service: Cardiovascular;  Laterality: N/A;   MELANOMA EXCISION Right 02/12/2021   Procedure: WIDE LOCAL EXCISION RIGHT LOWER LEG MELANOMA , ADVANCEMENT FLAP CLOSURE DEFECT;  Surgeon: Almond Lint, MD;  Location: [facility] OR;  Service: General;  Laterality: Right;   REMOVAL OF STONES  04/02/2023   Procedure: REMOVAL OF STONES;  Surgeon: Meryl Dare, MD;  Location: [facility] ENDOSCOPY;  Service: Gastroenterology;;    Susa Day  04/02/2023   Procedure: Susa Day;  Surgeon: Meryl Dare, MD;  Location: [facility] ENDOSCOPY;  Service: Gastroenterology;;   SENTINEL NODE BIOPSY Right 02/12/2021   Procedure: SENTINEL NODE BIOPSY;  Surgeon: Almond Lint, MD;  Location: [facility] OR;  Service: General;  Laterality: Right;   SPHINCTEROTOMY  04/02/2023   Procedure: SPHINCTEROTOMY;  Surgeon: Meryl Dare, MD;  Location: [facility] ENDOSCOPY;  Service: Gastroenterology;;    Social History: Social History   Socioeconomic History   Marital status: Widowed    Spouse name: Not on file   Number of children: 3   Years of education: Not on file   Highest education level: Not on file  Occupational History   Not on file  Tobacco Use   Smoking status: Never   Smokeless tobacco: Never  Vaping Use   Vaping status: Never Used  Substance and Sexual Activity   Alcohol use: No   Drug use: No   Sexual activity: Not on file  Other Topics Concern   Not on file  Social History Narrative   Not on file   Social Drivers of Health   Financial Resource Strain: Not on file  Food Insecurity: No Food Insecurity (04/29/2023)   Hunger Vital Sign    Worried About Running Out of Food in the Last Year: Never true    Ran Out of Food in the Last Year: Never true  Transportation Needs: No Transportation Needs (04/29/2023)   PRAPARE - Administrator, Civil Service (Medical): No    Lack of Transportation (Non-Medical): No  Physical Activity: Not on file  Stress: Not on file  Social Connections: Not on file  Intimate Partner Violence: Not At Risk (04/29/2023)   Humiliation, Afraid, Rape, and Kick questionnaire    Fear of Current or Ex-Partner: No    Emotionally Abused: No  Physically Abused: No    Sexually Abused: No    Family History: Family History  Problem Relation Age of Onset   Cancer Father    Hypertension Sister    Hypertension Sister     Current Medications:  Current Outpatient Medications:     ALPRAZolam (XANAX) 0.5 MG tablet, Take 0.5 mg by mouth 3 (three) times daily., Disp: , Rfl: 2   calcium carbonate (OSCAL) 1500 (600 Ca) MG TABS tablet, Take 600 mg of elemental calcium by mouth 3 (three) times a week., Disp: , Rfl:    cholecalciferol (VITAMIN D) 1000 UNITS tablet, Take 1,000 Units by mouth 3 (three) times a week., Disp: , Rfl:    Cyanocobalamin (VITAMIN B-12 PO), Take 1 tablet by mouth once a week., Disp: , Rfl:    diphenhydramine-acetaminophen (TYLENOL PM) 25-500 MG TABS tablet, Take 1 tablet by mouth at bedtime as needed., Disp: , Rfl:    levothyroxine (SYNTHROID) 100 MCG tablet, Take 1 tablet (100 mcg total) by mouth daily before breakfast., Disp: 30 tablet, Rfl: 3   metoprolol tartrate (LOPRESSOR) 25 MG tablet, Take 1 tablet (25 mg total) by mouth 2 (two) times daily. Hold until follow-up with your doctor/cardiologist, Disp: , Rfl:    ondansetron (ZOFRAN-ODT) 4 MG disintegrating tablet, Take 1 tablet (4 mg total) by mouth every 6 (six) hours as needed for nausea., Disp: 20 tablet, Rfl: 0   prednisoLONE acetate (PRED FORTE) 1 % ophthalmic suspension, Place 1 drop into both eyes daily., Disp: , Rfl:  No current facility-administered medications for this visit.  Facility-Administered Medications Ordered in Other Visits:    heparin lock flush 100 unit/mL, 500 Units, Intracatheter, Once PRN, [name], MD   ipilimumab (YERVOY) 70 mg in sodium chloride 0.9 % 50 mL chemo infusion, 70 mg, Intravenous, Once, [name], MD   pembrolizumab [facility]) 200 mg in sodium chloride 0.9 % 50 mL chemo infusion, 200 mg, Intravenous, Once, [name], MD   sodium chloride flush (NS) 0.9 % injection 10 mL, 10 mL, Intracatheter, PRN, [name], MD   Allergies: Allergies  Allergen Reactions   Tape     Pulls skin, please use paper tape    REVIEW OF SYSTEMS:   Review of Systems  Constitutional:  Negative for chills, fatigue and fever.  HENT:    Negative for lump/mass, mouth sores, nosebleeds, sore throat and trouble swallowing.   Eyes:  Negative for eye problems.  Respiratory:  Negative for cough and shortness of breath.   Cardiovascular:  Negative for chest pain, leg swelling and palpitations.  Gastrointestinal:  Negative for abdominal pain, constipation, diarrhea, nausea and vomiting.  Genitourinary:  Negative for bladder incontinence, difficulty urinating, dysuria, frequency, hematuria and nocturia.   Musculoskeletal:  Negative for arthralgias, back pain, flank pain, myalgias and neck pain.  Skin:  Negative for itching and rash.  Neurological:  Negative for dizziness, headaches and numbness.  Hematological:  Does not bruise/bleed easily.  Psychiatric/Behavioral:  Positive for sleep disturbance. Negative for depression and suicidal ideas. The patient is not nervous/anxious.   All other systems reviewed and are negative.    VITALS:   Weight 155 lb 6.8 oz (70.5 kg).  Wt Readings from Last 3 Encounters:  07/29/23 155 lb 6.8 oz (70.5 kg)  07/08/23 154 lb 12.2 oz (70.2 kg)  06/24/23 153 lb (69.4 kg)    Body mass index is 28.43 kg/m.  Performance status (ECOG): 1 - Symptomatic but completely ambulatory  PHYSICAL EXAM:   Physical Exam Vitals  and nursing note reviewed. Exam conducted with a chaperone present.  Constitutional:      Appearance: Normal appearance.  Cardiovascular:     Rate and Rhythm: Normal rate and regular rhythm.     Pulses: Normal pulses.     Heart sounds: Normal heart sounds.  Pulmonary:     Effort: Pulmonary effort is normal.     Breath sounds: Normal breath sounds.  Abdominal:     Palpations: Abdomen is soft. There is no hepatomegaly, splenomegaly or mass.     Tenderness: There is no abdominal tenderness.  Musculoskeletal:     Right lower leg: No edema.     Left lower leg: No edema.  Lymphadenopathy:     Cervical: No cervical adenopathy.     Right cervical: No superficial, deep or posterior  cervical adenopathy.    Left cervical: No superficial, deep or posterior cervical adenopathy.     Upper Body:     Right upper body: No supraclavicular or axillary adenopathy.     Left upper body: No supraclavicular or axillary adenopathy.  Neurological:     General: No focal deficit present.     Mental Status: She is alert and oriented to person, place, and time.  Psychiatric:        Mood and Affect: Mood normal.        Behavior: Behavior normal.     LABS:   CBC     Component Value Date/Time   WBC 3.6 (L) 07/29/2023 1020   RBC 4.32 07/29/2023 1020   HGB 11.1 (L) 07/29/2023 1020   HCT 36.5 07/29/2023 1020   PLT 158 07/29/2023 1020   MCV 84.5 07/29/2023 1020   MCH 25.7 (L) 07/29/2023 1020   MCHC 30.4 07/29/2023 1020   RDW 14.9 07/29/2023 1020   LYMPHSABS 1.0 07/29/2023 1020   MONOABS 0.3 07/29/2023 1020   EOSABS 0.1 07/29/2023 1020   BASOSABS 0.0 07/29/2023 1020    CMP      Component Value Date/Time   NA 137 07/29/2023 1020   K 3.9 07/29/2023 1020   CL 99 07/29/2023 1020   CO2 26 07/29/2023 1020   GLUCOSE 110 (H) 07/29/2023 1020   BUN 13 07/29/2023 1020   CREATININE 0.78 07/29/2023 1020   CALCIUM 9.1 07/29/2023 1020   PROT 6.9 07/29/2023 1020   ALBUMIN 3.4 (L) 07/29/2023 1020   AST 23 07/29/2023 1020   ALT 12 07/29/2023 1020   ALKPHOS 98 07/29/2023 1020   BILITOT 0.7 07/29/2023 1020   GFRNONAA >60 07/29/2023 1020   GFRAA >60 09/29/2014 1237     No results found for: "CEA1", "CEA" / No results found for: "CEA1", "CEA" No results found for: "PSA1" No results found for: "FIE332" No results found for: "CAN125"  No results found for: "TOTALPROTELP", "ALBUMINELP", "A1GS", "A2GS", "BETS", "BETA2SER", "GAMS", "MSPIKE", "SPEI" Lab Results  Component Value Date   TIBC 312 06/17/2023   FERRITIN 20 06/17/2023   IRONPCTSAT 10 (L) 06/17/2023   Lab Results  Component Value Date   LDH 348 (H) 03/30/2023   LDH 137 06/05/2022   LDH 144 05/13/2022     STUDIES:    NM PET Image Restage (PS) Whole Body Result Date: 07/29/2023 CLINICAL DATA:  Subsequent treatment strategy for melanoma. EXAM: NUCLEAR MEDICINE PET WHOLE BODY TECHNIQUE: 7.2 mCi F-18 FDG was injected intravenously. Full-ring PET imaging was performed from the head to foot after the radiotracer. CT data was obtained and used for attenuation correction and anatomic localization. Fasting blood glucose:  131 mg/dl COMPARISON:  21/30/8657. FINDINGS: Mediastinal blood pool activity: SUV max 2.3 HEAD/NECK: Medial right parotid nodule measures 8 mm (CT image 57), SUV max 3.4, as before. No additional abnormal hypermetabolism. Incidental CT findings: None. CHEST: No abnormal hypermetabolism. Incidental CT findings: Right IJ Port-A-Cath terminates in the high right atrium. Atherosclerotic calcification of the aorta, aortic valve and coronary arteries. Heart is enlarged. No pericardial or pleural effusion. ABDOMEN/PELVIS: Persistent abnormal hypermetabolism in the gallbladder fossa, SUV max 8.1, decreased from 13.9. Right iliac chain lymph nodes have decreased in hypermetabolism with index right external iliac lymph node measuring 2.7 cm (CT image 234), SUV max 12.0, previously 14.1. Mixed response in right iliac lymph nodes with medial lymph node measuring 1.4 cm (CT image 254), SUV max 12.0, previously 5.8. 2 peritoneal nodules in the posterior right upper quadrant are stable with index 8 mm nodule (CT image 169), SUV max 4.3, decreased from 6.1. Incidental CT findings: Cholecystectomy. Mixed density presacral mass contains fat, measuring 5.7 x 6.6 cm, unchanged. SKELETON: New focal hypermetabolism in the ventral aspect of the L5 vertebral body, SUV max 4.1. Overall decreased hypermetabolism within the L3 vertebral body, SUV max 5.8, previously 7.3. Stable distal femoral metastasis, SUV max 3.3. Incidental CT findings: Degenerative changes in the spine. EXTREMITIES: Hypermetabolic nodule along the medial right thigh  measures 11 mm (CT image 307), SUV max 4.2, stable in size and hypermetabolism. No additional abnormal hypermetabolism. Incidental CT findings: None. IMPRESSION: 1. Somewhat mixed response to therapy with enlarging and increasingly hypermetabolic right inguinal adenopathy and a new L5 metastasis. Stable or decreased hypermetabolism involving right upper quadrant peritoneal nodules, right iliac chain adenopathy, L5 and right femoral metastases and a treated soft tissue lesion in the medial right thigh. 2. Decreasing hypermetabolism in the gallbladder fossa, possibly postoperative in etiology. Recommend continued attention on follow-up. 3. Hypermetabolic medial right parotid nodule, stable. 4. Mixed attenuation presacral mass, previously characterized as a myelolipoma. 5. Aortic atherosclerosis (ICD10-I70.0). Coronary artery calcification. Electronically Signed   By: Leanna Battles M.D.   On: 07/29/2023 09:34

## 2023-07-29 ENCOUNTER — Inpatient Hospital Stay: Payer: Medicare Other | Attending: Hematology

## 2023-07-29 ENCOUNTER — Inpatient Hospital Stay: Payer: Medicare Other | Admitting: Hematology

## 2023-07-29 ENCOUNTER — Inpatient Hospital Stay: Payer: Medicare Other

## 2023-07-29 VITALS — Wt 155.4 lb

## 2023-07-29 VITALS — BP 115/70 | HR 70 | Temp 98.2°F | Resp 18

## 2023-07-29 DIAGNOSIS — Z79620 Long term (current) use of immunosuppressive biologic: Secondary | ICD-10-CM | POA: Insufficient documentation

## 2023-07-29 DIAGNOSIS — Z95828 Presence of other vascular implants and grafts: Secondary | ICD-10-CM

## 2023-07-29 DIAGNOSIS — M79651 Pain in right thigh: Secondary | ICD-10-CM | POA: Diagnosis not present

## 2023-07-29 DIAGNOSIS — C4371 Malignant melanoma of right lower limb, including hip: Secondary | ICD-10-CM

## 2023-07-29 DIAGNOSIS — Z8679 Personal history of other diseases of the circulatory system: Secondary | ICD-10-CM | POA: Insufficient documentation

## 2023-07-29 DIAGNOSIS — E039 Hypothyroidism, unspecified: Secondary | ICD-10-CM | POA: Diagnosis not present

## 2023-07-29 DIAGNOSIS — C7951 Secondary malignant neoplasm of bone: Secondary | ICD-10-CM | POA: Insufficient documentation

## 2023-07-29 DIAGNOSIS — Z5112 Encounter for antineoplastic immunotherapy: Secondary | ICD-10-CM | POA: Diagnosis not present

## 2023-07-29 DIAGNOSIS — D649 Anemia, unspecified: Secondary | ICD-10-CM | POA: Insufficient documentation

## 2023-07-29 LAB — TROPONIN I (HIGH SENSITIVITY): Troponin I (High Sensitivity): 12 ng/L (ref <18)

## 2023-07-29 LAB — CBC WITH DIFFERENTIAL/PLATELET
Abs Immature Granulocytes: 0.01 10*3/uL (ref 0.00–0.07)
Basophils Absolute: 0 10*3/uL (ref 0.0–0.1)
Basophils Relative: 1 %
Eosinophils Absolute: 0.1 10*3/uL (ref 0.0–0.5)
Eosinophils Relative: 2 %
HCT: 36.5 % (ref 36.0–46.0)
Hemoglobin: 11.1 g/dL — ABNORMAL LOW (ref 12.0–15.0)
Immature Granulocytes: 0 %
Lymphocytes Relative: 28 %
Lymphs Abs: 1 10*3/uL (ref 0.7–4.0)
MCH: 25.7 pg — ABNORMAL LOW (ref 26.0–34.0)
MCHC: 30.4 g/dL (ref 30.0–36.0)
MCV: 84.5 fL (ref 80.0–100.0)
Monocytes Absolute: 0.3 10*3/uL (ref 0.1–1.0)
Monocytes Relative: 8 %
Neutro Abs: 2.2 10*3/uL (ref 1.7–7.7)
Neutrophils Relative %: 61 %
Platelets: 158 10*3/uL (ref 150–400)
RBC: 4.32 MIL/uL (ref 3.87–5.11)
RDW: 14.9 % (ref 11.5–15.5)
WBC: 3.6 10*3/uL — ABNORMAL LOW (ref 4.0–10.5)
nRBC: 0 % (ref 0.0–0.2)

## 2023-07-29 LAB — COMPREHENSIVE METABOLIC PANEL
ALT: 12 U/L (ref 0–44)
AST: 23 U/L (ref 15–41)
Albumin: 3.4 g/dL — ABNORMAL LOW (ref 3.5–5.0)
Alkaline Phosphatase: 98 U/L (ref 38–126)
Anion gap: 12 (ref 5–15)
BUN: 13 mg/dL (ref 8–23)
CO2: 26 mmol/L (ref 22–32)
Calcium: 9.1 mg/dL (ref 8.9–10.3)
Chloride: 99 mmol/L (ref 98–111)
Creatinine, Ser: 0.78 mg/dL (ref 0.44–1.00)
GFR, Estimated: >60 mL/min (ref >60)
Glucose, Bld: 110 mg/dL — ABNORMAL HIGH (ref 70–99)
Potassium: 3.9 mmol/L (ref 3.5–5.1)
Sodium: 137 mmol/L (ref 135–145)
Total Bilirubin: 0.7 mg/dL (ref 0.0–1.2)
Total Protein: 6.9 g/dL (ref 6.5–8.1)

## 2023-07-29 LAB — TSH: TSH: 1.335 u[IU]/mL (ref 0.350–4.500)

## 2023-07-29 LAB — MAGNESIUM: Magnesium: 2 mg/dL (ref 1.7–2.4)

## 2023-07-29 MED ORDER — SODIUM CHLORIDE 0.9 % IV SOLN
200.0000 mg | Freq: Once | INTRAVENOUS | Status: AC
  Administered 2023-07-29: 200 mg via INTRAVENOUS
  Filled 2023-07-29: qty 8

## 2023-07-29 MED ORDER — HEPARIN SOD (PORK) LOCK FLUSH 100 UNIT/ML IV SOLN
500.0000 [IU] | Freq: Once | INTRAVENOUS | Status: AC | PRN
  Administered 2023-07-29: 500 [IU]

## 2023-07-29 MED ORDER — CETIRIZINE HCL 10 MG/ML IV SOLN
5.0000 mg | Freq: Once | INTRAVENOUS | Status: AC
  Administered 2023-07-29: 5 mg via INTRAVENOUS
  Filled 2023-07-29: qty 1

## 2023-07-29 MED ORDER — SODIUM CHLORIDE 0.9 % IV SOLN
70.0000 mg | Freq: Once | INTRAVENOUS | Status: AC
  Administered 2023-07-29: 70 mg via INTRAVENOUS
  Filled 2023-07-29: qty 14

## 2023-07-29 MED ORDER — SODIUM CHLORIDE 0.9 % IV SOLN
Freq: Once | INTRAVENOUS | Status: AC
Start: 2023-07-29 — End: 2023-07-29

## 2023-07-29 MED ORDER — FAMOTIDINE IN NACL 20-0.9 MG/50ML-% IV SOLN
20.0000 mg | Freq: Once | INTRAVENOUS | Status: AC
  Administered 2023-07-29: 20 mg via INTRAVENOUS
  Filled 2023-07-29: qty 50

## 2023-07-29 MED ORDER — SODIUM CHLORIDE 0.9% FLUSH
10.0000 mL | Freq: Once | INTRAVENOUS | Status: AC
  Administered 2023-07-29: 10 mL via INTRAVENOUS

## 2023-07-29 MED ORDER — SODIUM CHLORIDE 0.9% FLUSH
10.0000 mL | INTRAVENOUS | Status: DC | PRN
  Administered 2023-07-29: 10 mL

## 2023-07-29 NOTE — Progress Notes (Signed)
Ipilimumab (YERVOY) Patient Monitoring Assessment   Is the patient experiencing any of the following general symptoms?:  []Difficulty performing normal activities []Feeling sluggish or cold all the time []Unusual weight gain []Constant or unusual headaches []Feeling dizzy or faint []Changes in eyesight (blurry vision, double vision, or other vision problems) []Changes in mood or behavior (ex: decreased sex drive, irritability, or forgetfulness) []Starting new medications (ex: steroids, other medications that lower immune response) [x]Patient is not experiencing any of the general symptoms above.    Gastrointestinal  Patient is having normal bowel movements each day.  Is this different from baseline? []Yes [x]No Are your stools watery or do they have a foul smell? []Yes [x]No Have you seen blood in your stools? []Yes [x]No Are your stools dark, tarry, or sticky? []Yes [x]No Are you having pain or tenderness in your belly? []Yes [x]No  Skin Does your skin itch? []Yes [x]No Do you have a rash? []Yes [x]No Has your skin blistered and/or peeled? []Yes [x]No Do you have sores in your mouth? []Yes [x]No  Hepatic Has your urine been dark or tea colored? []Yes [x]No Have you noticed that your skin or the whites of your eyes are turning yellow? []Yes [x]No Are you bleeding or bruising more easily than normal? []Yes [x]No Are you nauseous and/or vomiting? []Yes [x]No Do you have pain on the right side of your stomach? []Yes [x]No  Neurologic  Are you having unusual weakness of legs, arms, or face? []Yes [x]No Are you having numbness or tingling in your hands or feet? []Yes [x]No  Elizabeth Norman Elizabeth Norman   Patient tolerated therapy with no complaints voiced.  Side effects with management reviewed with understanding verbalized.  Port site clean and dry with no bruising or swelling noted at site.  Good blood return noted before and after administration of therapy.  Band aid applied.  Patient  left in satisfactory condition with VSS and no s/s of distress noted.  

## 2023-07-29 NOTE — Patient Instructions (Addendum)
 [facility] at [facility] Discharge Instructions   You were seen and examined today by Dr. Ellin Saba.  He reviewed the results of your lab work which are normal/stable.   He reviewed the results of the PET scan. Some spots have gotten bigger, some have gotten smaller. There is a new spot on the spine in the lower back representing that the cancer has spread. We will continue the same treatment for now and monitor. If the cancer continues to grow or spread we will need to consider treatment with chemotherapy.   We will refer you to radiation oncology to see if we can treat the lymph node in the right groin.   Return as scheduled.    Thank you for choosing [facility] at [facility] to provide your oncology and hematology care.  To afford each patient quality time with our provider, please arrive at least 15 minutes before your scheduled appointment time.   If you have a lab appointment with the Cancer Center please come in thru the Main Entrance and check in at the main information desk.  You need to re-schedule your appointment should you arrive 10 or more minutes late.  We strive to give you quality time with our providers, and arriving late affects you and other patients whose appointments are after yours.  Also, if you no show three or more times for appointments you may be dismissed from the clinic at the providers discretion.     Again, thank you for choosing [facility].  Our hope is that these requests will decrease the amount of time that you wait before being seen by our physicians.       _____________________________________________________________  Should you have questions after your visit to [facility], please contact our office at [phone] and follow the prompts.  Our office hours are 8:00 a.m. and 4:30 p.m. Monday - Friday.  Please note that voicemails left after 4:00 p.m. may not be returned until  the following business day.  We are closed weekends and major holidays.  You do have access to a nurse 24-7, just call the main number to the clinic [phone] and do not press any options, hold on the line and a nurse will answer the phone.    For prescription refill requests, have your pharmacy contact our office and allow 72 hours.    Due to Covid, you will need to wear a mask upon entering the hospital. If you do not have a mask, a mask will be given to you at the Main Entrance upon arrival. For doctor visits, patients may have 1 support person age 85 or older with them. For treatment visits, patients can not have anyone with them due to social distancing guidelines and our immunocompromised population.

## 2023-07-29 NOTE — Patient Instructions (Signed)
 [facility] [place] - A DEPT OF [facility]  Discharge Instructions: Thank you for choosing [facility] to provide your oncology and hematology care.  If you have a lab appointment with the Cancer Center - please note that after April 8th, 2024, all labs will be drawn in the cancer center.  You do not have to check in or register with the main entrance as you have in the past but will complete your check-in in the cancer center.  Wear comfortable clothing and clothing appropriate for easy access to any Portacath or PICC line.   We strive to give you quality time with your provider. You may need to reschedule your appointment if you arrive late (15 or more minutes).  Arriving late affects you and other patients whose appointments are after yours.  Also, if you miss three or more appointments without notifying the office, you may be dismissed from the clinic at the provider's discretion.      For prescription refill requests, have your pharmacy contact our office and allow 72 hours for refills to be completed.    Today you received the following chemotherapy and/or immunotherapy agents opdiovo and yervoy      To help prevent nausea and vomiting after your treatment, we encourage you to take your nausea medication as directed.  BELOW ARE SYMPTOMS THAT SHOULD BE REPORTED IMMEDIATELY: *FEVER GREATER THAN 100.4 F (38 C) OR HIGHER *CHILLS OR SWEATING *NAUSEA AND VOMITING THAT IS NOT CONTROLLED WITH YOUR NAUSEA MEDICATION *UNUSUAL SHORTNESS OF BREATH *UNUSUAL BRUISING OR BLEEDING *URINARY PROBLEMS (pain or burning when urinating, or frequent urination) *BOWEL PROBLEMS (unusual diarrhea, constipation, pain near the anus) TENDERNESS IN MOUTH AND THROAT WITH OR WITHOUT PRESENCE OF ULCERS (sore throat, sores in mouth, or a toothache) UNUSUAL RASH, SWELLING OR PAIN  UNUSUAL VAGINAL DISCHARGE OR ITCHING   Items with * indicate a potential emergency and should be  followed up as soon as possible or go to the Emergency Department if any problems should occur.  Please show the CHEMOTHERAPY ALERT CARD or IMMUNOTHERAPY ALERT CARD at check-in to the Emergency Department and triage nurse.  Should you have questions after your visit or need to cancel or reschedule your appointment, please contact [facility] [place] - A DEPT OF Eligha Bridegroom [facility] [phone]  and follow the prompts.  Office hours are 8:00 a.m. to 4:30 p.m. Monday - Friday. Please note that voicemails left after 4:00 p.m. may not be returned until the following business day.  We are closed weekends and major holidays. You have access to a nurse at all times for urgent questions. Please call the main number to the clinic [phone] and follow the prompts.  For any non-urgent questions, you may also contact your provider using MyChart. We now offer e-Visits for anyone 17 and older to request care online for non-urgent symptoms. For details visit [email or web address].   Also download the MyChart app! Go to the app store, search "MyChart", open the app, select , and log in with your MyChart username and password.

## 2023-07-30 ENCOUNTER — Other Ambulatory Visit: Payer: Self-pay

## 2023-07-30 LAB — T4: T4, Total: 7.9 ug/dL (ref 4.5–12.0)

## 2023-08-04 DIAGNOSIS — C4371 Malignant melanoma of right lower limb, including hip: Secondary | ICD-10-CM | POA: Diagnosis not present

## 2023-08-04 DIAGNOSIS — D4989 Neoplasm of unspecified behavior of other specified sites: Secondary | ICD-10-CM | POA: Diagnosis not present

## 2023-08-04 DIAGNOSIS — C774 Secondary and unspecified malignant neoplasm of inguinal and lower limb lymph nodes: Secondary | ICD-10-CM | POA: Diagnosis not present

## 2023-08-12 DIAGNOSIS — C4371 Malignant melanoma of right lower limb, including hip: Secondary | ICD-10-CM | POA: Diagnosis not present

## 2023-08-12 DIAGNOSIS — C7951 Secondary malignant neoplasm of bone: Secondary | ICD-10-CM | POA: Diagnosis not present

## 2023-08-12 DIAGNOSIS — D4989 Neoplasm of unspecified behavior of other specified sites: Secondary | ICD-10-CM | POA: Diagnosis not present

## 2023-08-12 DIAGNOSIS — C774 Secondary and unspecified malignant neoplasm of inguinal and lower limb lymph nodes: Secondary | ICD-10-CM | POA: Diagnosis not present

## 2023-08-18 ENCOUNTER — Other Ambulatory Visit: Payer: Self-pay

## 2023-08-19 ENCOUNTER — Inpatient Hospital Stay: Attending: Hematology

## 2023-08-19 ENCOUNTER — Encounter: Payer: Self-pay | Admitting: Hematology

## 2023-08-19 ENCOUNTER — Inpatient Hospital Stay

## 2023-08-19 VITALS — BP 134/63 | HR 72 | Temp 97.5°F | Resp 16

## 2023-08-19 DIAGNOSIS — Z95828 Presence of other vascular implants and grafts: Secondary | ICD-10-CM

## 2023-08-19 DIAGNOSIS — E039 Hypothyroidism, unspecified: Secondary | ICD-10-CM | POA: Insufficient documentation

## 2023-08-19 DIAGNOSIS — I514 Myocarditis, unspecified: Secondary | ICD-10-CM | POA: Diagnosis not present

## 2023-08-19 DIAGNOSIS — D649 Anemia, unspecified: Secondary | ICD-10-CM | POA: Insufficient documentation

## 2023-08-19 DIAGNOSIS — Z800 Family history of malignant neoplasm of digestive organs: Secondary | ICD-10-CM | POA: Diagnosis not present

## 2023-08-19 DIAGNOSIS — Z923 Personal history of irradiation: Secondary | ICD-10-CM | POA: Insufficient documentation

## 2023-08-19 DIAGNOSIS — C786 Secondary malignant neoplasm of retroperitoneum and peritoneum: Secondary | ICD-10-CM | POA: Diagnosis not present

## 2023-08-19 DIAGNOSIS — Z803 Family history of malignant neoplasm of breast: Secondary | ICD-10-CM | POA: Diagnosis not present

## 2023-08-19 DIAGNOSIS — C4371 Malignant melanoma of right lower limb, including hip: Secondary | ICD-10-CM | POA: Diagnosis not present

## 2023-08-19 DIAGNOSIS — C7951 Secondary malignant neoplasm of bone: Secondary | ICD-10-CM | POA: Diagnosis not present

## 2023-08-19 DIAGNOSIS — M79651 Pain in right thigh: Secondary | ICD-10-CM | POA: Diagnosis not present

## 2023-08-19 DIAGNOSIS — Z79620 Long term (current) use of immunosuppressive biologic: Secondary | ICD-10-CM | POA: Diagnosis not present

## 2023-08-19 DIAGNOSIS — Z5112 Encounter for antineoplastic immunotherapy: Secondary | ICD-10-CM | POA: Diagnosis not present

## 2023-08-19 LAB — COMPREHENSIVE METABOLIC PANEL WITH GFR
ALT: 13 U/L (ref 0–44)
AST: 22 U/L (ref 15–41)
Albumin: 3.2 g/dL — ABNORMAL LOW (ref 3.5–5.0)
Alkaline Phosphatase: 116 U/L (ref 38–126)
Anion gap: 9 (ref 5–15)
BUN: 13 mg/dL (ref 8–23)
CO2: 27 mmol/L (ref 22–32)
Calcium: 8.9 mg/dL (ref 8.9–10.3)
Chloride: 100 mmol/L (ref 98–111)
Creatinine, Ser: 0.86 mg/dL (ref 0.44–1.00)
GFR, Estimated: >60 mL/min (ref >60)
Glucose, Bld: 112 mg/dL — ABNORMAL HIGH (ref 70–99)
Potassium: 4 mmol/L (ref 3.5–5.1)
Sodium: 136 mmol/L (ref 135–145)
Total Bilirubin: 0.7 mg/dL (ref 0.0–1.2)
Total Protein: 6.4 g/dL — ABNORMAL LOW (ref 6.5–8.1)

## 2023-08-19 LAB — CBC WITH DIFFERENTIAL/PLATELET
Abs Immature Granulocytes: 0.01 10*3/uL (ref 0.00–0.07)
Basophils Absolute: 0 10*3/uL (ref 0.0–0.1)
Basophils Relative: 1 %
Eosinophils Absolute: 0.1 10*3/uL (ref 0.0–0.5)
Eosinophils Relative: 3 %
HCT: 35.7 % — ABNORMAL LOW (ref 36.0–46.0)
Hemoglobin: 10.9 g/dL — ABNORMAL LOW (ref 12.0–15.0)
Immature Granulocytes: 0 %
Lymphocytes Relative: 28 %
Lymphs Abs: 1 10*3/uL (ref 0.7–4.0)
MCH: 26.1 pg (ref 26.0–34.0)
MCHC: 30.5 g/dL (ref 30.0–36.0)
MCV: 85.6 fL (ref 80.0–100.0)
Monocytes Absolute: 0.3 10*3/uL (ref 0.1–1.0)
Monocytes Relative: 10 %
Neutro Abs: 2 10*3/uL (ref 1.7–7.7)
Neutrophils Relative %: 58 %
Platelets: 136 10*3/uL — ABNORMAL LOW (ref 150–400)
RBC: 4.17 MIL/uL (ref 3.87–5.11)
RDW: 15.9 % — ABNORMAL HIGH (ref 11.5–15.5)
WBC: 3.4 10*3/uL — ABNORMAL LOW (ref 4.0–10.5)
nRBC: 0 % (ref 0.0–0.2)

## 2023-08-19 LAB — TROPONIN I (HIGH SENSITIVITY): Troponin I (High Sensitivity): 10 ng/L (ref <18)

## 2023-08-19 LAB — TSH: TSH: 1.94 u[IU]/mL (ref 0.350–4.500)

## 2023-08-19 LAB — MAGNESIUM: Magnesium: 1.9 mg/dL (ref 1.7–2.4)

## 2023-08-19 MED ORDER — SODIUM CHLORIDE 0.9 % IV SOLN
70.0000 mg | Freq: Once | INTRAVENOUS | Status: AC
  Administered 2023-08-19: 70 mg via INTRAVENOUS
  Filled 2023-08-19: qty 14

## 2023-08-19 MED ORDER — FAMOTIDINE IN NACL 20-0.9 MG/50ML-% IV SOLN
20.0000 mg | Freq: Once | INTRAVENOUS | Status: AC
  Administered 2023-08-19: 20 mg via INTRAVENOUS
  Filled 2023-08-19: qty 50

## 2023-08-19 MED ORDER — PEMBROLIZUMAB CHEMO INJECTION 100 MG/4ML
200.0000 mg | Freq: Once | INTRAVENOUS | Status: AC
  Administered 2023-08-19: 200 mg via INTRAVENOUS
  Filled 2023-08-19: qty 8

## 2023-08-19 MED ORDER — SODIUM CHLORIDE 0.9% FLUSH
10.0000 mL | INTRAVENOUS | Status: DC | PRN
  Administered 2023-08-19: 10 mL

## 2023-08-19 MED ORDER — HEPARIN SOD (PORK) LOCK FLUSH 100 UNIT/ML IV SOLN
500.0000 [IU] | Freq: Once | INTRAVENOUS | Status: AC | PRN
  Administered 2023-08-19: 500 [IU]

## 2023-08-19 MED ORDER — CETIRIZINE HCL 10 MG/ML IV SOLN
5.0000 mg | Freq: Once | INTRAVENOUS | Status: AC
  Administered 2023-08-19: 5 mg via INTRAVENOUS
  Filled 2023-08-19: qty 1

## 2023-08-19 MED ORDER — SODIUM CHLORIDE 0.9% FLUSH
10.0000 mL | Freq: Once | INTRAVENOUS | Status: AC
  Administered 2023-08-19: 10 mL via INTRAVENOUS

## 2023-08-19 MED ORDER — SODIUM CHLORIDE 0.9 % IV SOLN: Freq: Once | INTRAVENOUS | Status: AC

## 2023-08-19 NOTE — Patient Instructions (Signed)
 [facility] [place] - A DEPT OF [facility]  Discharge Instructions: Thank you for choosing [facility] to provide your oncology and hematology care.  If you have a lab appointment with the Cancer Center - please note that after April 8th, 2024, all labs will be drawn in the cancer center.  You do not have to check in or register with the main entrance as you have in the past but will complete your check-in in the cancer center.  Wear comfortable clothing and clothing appropriate for easy access to any Portacath or PICC line.   We strive to give you quality time with your provider. You may need to reschedule your appointment if you arrive late (15 or more minutes).  Arriving late affects you and other patients whose appointments are after yours.  Also, if you miss three or more appointments without notifying the office, you may be dismissed from the clinic at the provider's discretion.      For prescription refill requests, have your pharmacy contact our office and allow 72 hours for refills to be completed.    Today you received the following chemotherapy and/or immunotherapy agents Keytruda/Yervoy      To help prevent nausea and vomiting after your treatment, we encourage you to take your nausea medication as directed.  BELOW ARE SYMPTOMS THAT SHOULD BE REPORTED IMMEDIATELY: *FEVER GREATER THAN 100.4 F (38 C) OR HIGHER *CHILLS OR SWEATING *NAUSEA AND VOMITING THAT IS NOT CONTROLLED WITH YOUR NAUSEA MEDICATION *UNUSUAL SHORTNESS OF BREATH *UNUSUAL BRUISING OR BLEEDING *URINARY PROBLEMS (pain or burning when urinating, or frequent urination) *BOWEL PROBLEMS (unusual diarrhea, constipation, pain near the anus) TENDERNESS IN MOUTH AND THROAT WITH OR WITHOUT PRESENCE OF ULCERS (sore throat, sores in mouth, or a toothache) UNUSUAL RASH, SWELLING OR PAIN  UNUSUAL VAGINAL DISCHARGE OR ITCHING   Items with * indicate a potential emergency and should be  followed up as soon as possible or go to the Emergency Department if any problems should occur.  Please show the CHEMOTHERAPY ALERT CARD or IMMUNOTHERAPY ALERT CARD at check-in to the Emergency Department and triage nurse.  Should you have questions after your visit or need to cancel or reschedule your appointment, please contact [facility] [place] - A DEPT OF Eligha Bridegroom [facility] [phone]  and follow the prompts.  Office hours are 8:00 a.m. to 4:30 p.m. Monday - Friday. Please note that voicemails left after 4:00 p.m. may not be returned until the following business day.  We are closed weekends and major holidays. You have access to a nurse at all times for urgent questions. Please call the main number to the clinic [phone] and follow the prompts.  For any non-urgent questions, you may also contact your provider using MyChart. We now offer e-Visits for anyone 27 and older to request care online for non-urgent symptoms. For details visit [email or web address].   Also download the MyChart app! Go to the app store, search "MyChart", open the app, select [place], and log in with your MyChart username and password.

## 2023-08-19 NOTE — Progress Notes (Signed)
 Patient presents today for Keytruda/Yervoy infusion per providers order.  Vital signs and labs within parameters for treatment.  Patient has no new complaints at this time.  Ipilimumab (YERVOY) Patient Monitoring Assessment   Is the patient experiencing any of the following general symptoms?:  [N ]Difficulty performing normal activities [N ]Feeling sluggish or cold all the time [N ]Unusual weight gain [N ]Constant or unusual headaches [N ]Feeling dizzy or faint [N ]Changes in eyesight (blurry vision, double vision, or other vision problems) [N ]Changes in mood or behavior (ex: decreased sex drive, irritability, or forgetfulness) [N ]Starting new medications (ex: steroids, other medications that lower immune response) [N ]Patient is not experiencing any of the general symptoms above.   Gastrointestinal  Patient is having 1 bowel movements each day.  Is this different from baseline? [ ] Yes [N ]No Are your stools watery or do they have a foul smell? [ ] Yes [N ]No Have you seen blood in your stools? [ ] Yes [N ]No Are your stools dark, tarry, or sticky? [ ] Yes [N]No Are you having pain or tenderness in your belly? [ ] Yes [N ]No  Skin Does your skin itch? [ ] Yes [N ]No Do you have a rash? [ ] Yes [N ]No Has your skin blistered and/or peeled? [ ] Yes [N ]No Do you have sores in your mouth? [ ] Yes [N ]No  Hepatic Has your urine been dark or tea colored? [ ] Yes [N ]No Have you noticed that your skin or the whites of your eyes are turning yellow? [ ] Yes [N ]No Are you bleeding or bruising more easily than normal? [ ] Yes [N ]No Are you nauseous and/or vomiting? [ ] Yes [N ]No Do you have pain on the right side of your stomach? [ ] Yes [N ]No  Neurologic  Are you having unusual weakness of legs, arms, or face? [ ] Yes [N ]No Are you having numbness or tingling in your hands or feet? [ ] Yes [N ]No  Treatment given today per MD orders.  Stable during infusion without adverse affects.  Vital  signs stable.  No complaints at this time.  Discharge from clinic ambulatory in stable condition.  Alert and oriented X 3.  Follow up with [facility] as scheduled.   Worthy Rancher

## 2023-08-20 DIAGNOSIS — C774 Secondary and unspecified malignant neoplasm of inguinal and lower limb lymph nodes: Secondary | ICD-10-CM | POA: Diagnosis not present

## 2023-08-20 DIAGNOSIS — C4371 Malignant melanoma of right lower limb, including hip: Secondary | ICD-10-CM | POA: Diagnosis not present

## 2023-08-20 DIAGNOSIS — D4989 Neoplasm of unspecified behavior of other specified sites: Secondary | ICD-10-CM | POA: Diagnosis not present

## 2023-08-20 DIAGNOSIS — C7951 Secondary malignant neoplasm of bone: Secondary | ICD-10-CM | POA: Diagnosis not present

## 2023-08-20 LAB — T4: T4, Total: 7.3 ug/dL (ref 4.5–12.0)

## 2023-08-25 DIAGNOSIS — D4989 Neoplasm of unspecified behavior of other specified sites: Secondary | ICD-10-CM | POA: Diagnosis not present

## 2023-08-25 DIAGNOSIS — C7951 Secondary malignant neoplasm of bone: Secondary | ICD-10-CM | POA: Diagnosis not present

## 2023-08-25 DIAGNOSIS — C774 Secondary and unspecified malignant neoplasm of inguinal and lower limb lymph nodes: Secondary | ICD-10-CM | POA: Diagnosis not present

## 2023-08-25 DIAGNOSIS — C4371 Malignant melanoma of right lower limb, including hip: Secondary | ICD-10-CM | POA: Diagnosis not present

## 2023-08-26 DIAGNOSIS — C774 Secondary and unspecified malignant neoplasm of inguinal and lower limb lymph nodes: Secondary | ICD-10-CM | POA: Diagnosis not present

## 2023-08-26 DIAGNOSIS — C4371 Malignant melanoma of right lower limb, including hip: Secondary | ICD-10-CM | POA: Diagnosis not present

## 2023-08-26 DIAGNOSIS — C7951 Secondary malignant neoplasm of bone: Secondary | ICD-10-CM | POA: Diagnosis not present

## 2023-08-26 DIAGNOSIS — D4989 Neoplasm of unspecified behavior of other specified sites: Secondary | ICD-10-CM | POA: Diagnosis not present

## 2023-08-27 DIAGNOSIS — D4989 Neoplasm of unspecified behavior of other specified sites: Secondary | ICD-10-CM | POA: Diagnosis not present

## 2023-08-27 DIAGNOSIS — C7951 Secondary malignant neoplasm of bone: Secondary | ICD-10-CM | POA: Diagnosis not present

## 2023-08-27 DIAGNOSIS — C4371 Malignant melanoma of right lower limb, including hip: Secondary | ICD-10-CM | POA: Diagnosis not present

## 2023-08-27 DIAGNOSIS — C774 Secondary and unspecified malignant neoplasm of inguinal and lower limb lymph nodes: Secondary | ICD-10-CM | POA: Diagnosis not present

## 2023-08-28 DIAGNOSIS — D4989 Neoplasm of unspecified behavior of other specified sites: Secondary | ICD-10-CM | POA: Diagnosis not present

## 2023-08-28 DIAGNOSIS — C7951 Secondary malignant neoplasm of bone: Secondary | ICD-10-CM | POA: Diagnosis not present

## 2023-08-28 DIAGNOSIS — C4371 Malignant melanoma of right lower limb, including hip: Secondary | ICD-10-CM | POA: Diagnosis not present

## 2023-08-28 DIAGNOSIS — C774 Secondary and unspecified malignant neoplasm of inguinal and lower limb lymph nodes: Secondary | ICD-10-CM | POA: Diagnosis not present

## 2023-08-31 DIAGNOSIS — D4989 Neoplasm of unspecified behavior of other specified sites: Secondary | ICD-10-CM | POA: Diagnosis not present

## 2023-08-31 DIAGNOSIS — C4371 Malignant melanoma of right lower limb, including hip: Secondary | ICD-10-CM | POA: Diagnosis not present

## 2023-08-31 DIAGNOSIS — C7951 Secondary malignant neoplasm of bone: Secondary | ICD-10-CM | POA: Diagnosis not present

## 2023-08-31 DIAGNOSIS — C774 Secondary and unspecified malignant neoplasm of inguinal and lower limb lymph nodes: Secondary | ICD-10-CM | POA: Diagnosis not present

## 2023-09-09 ENCOUNTER — Encounter: Payer: Self-pay | Admitting: Hematology

## 2023-09-09 ENCOUNTER — Inpatient Hospital Stay

## 2023-09-09 ENCOUNTER — Inpatient Hospital Stay: Admitting: Hematology

## 2023-09-09 VITALS — BP 105/58 | HR 77 | Temp 98.1°F | Resp 18

## 2023-09-09 VITALS — Wt 157.6 lb

## 2023-09-09 DIAGNOSIS — C786 Secondary malignant neoplasm of retroperitoneum and peritoneum: Secondary | ICD-10-CM | POA: Diagnosis not present

## 2023-09-09 DIAGNOSIS — Z95828 Presence of other vascular implants and grafts: Secondary | ICD-10-CM

## 2023-09-09 DIAGNOSIS — C4371 Malignant melanoma of right lower limb, including hip: Secondary | ICD-10-CM

## 2023-09-09 DIAGNOSIS — I514 Myocarditis, unspecified: Secondary | ICD-10-CM | POA: Diagnosis not present

## 2023-09-09 DIAGNOSIS — Z79620 Long term (current) use of immunosuppressive biologic: Secondary | ICD-10-CM | POA: Diagnosis not present

## 2023-09-09 DIAGNOSIS — Z800 Family history of malignant neoplasm of digestive organs: Secondary | ICD-10-CM | POA: Diagnosis not present

## 2023-09-09 DIAGNOSIS — Z923 Personal history of irradiation: Secondary | ICD-10-CM | POA: Diagnosis not present

## 2023-09-09 DIAGNOSIS — M79651 Pain in right thigh: Secondary | ICD-10-CM | POA: Diagnosis not present

## 2023-09-09 DIAGNOSIS — E039 Hypothyroidism, unspecified: Secondary | ICD-10-CM | POA: Diagnosis not present

## 2023-09-09 DIAGNOSIS — Z803 Family history of malignant neoplasm of breast: Secondary | ICD-10-CM | POA: Diagnosis not present

## 2023-09-09 DIAGNOSIS — D649 Anemia, unspecified: Secondary | ICD-10-CM | POA: Diagnosis not present

## 2023-09-09 DIAGNOSIS — Z5112 Encounter for antineoplastic immunotherapy: Secondary | ICD-10-CM | POA: Diagnosis not present

## 2023-09-09 DIAGNOSIS — C7951 Secondary malignant neoplasm of bone: Secondary | ICD-10-CM | POA: Diagnosis not present

## 2023-09-09 LAB — CBC WITH DIFFERENTIAL/PLATELET
Abs Immature Granulocytes: 0.01 10*3/uL (ref 0.00–0.07)
Basophils Absolute: 0 10*3/uL (ref 0.0–0.1)
Basophils Relative: 1 %
Eosinophils Absolute: 0.2 10*3/uL (ref 0.0–0.5)
Eosinophils Relative: 5 %
HCT: 37.1 % (ref 36.0–46.0)
Hemoglobin: 11.2 g/dL — ABNORMAL LOW (ref 12.0–15.0)
Immature Granulocytes: 0 %
Lymphocytes Relative: 19 %
Lymphs Abs: 0.7 10*3/uL (ref 0.7–4.0)
MCH: 26.5 pg (ref 26.0–34.0)
MCHC: 30.2 g/dL (ref 30.0–36.0)
MCV: 87.7 fL (ref 80.0–100.0)
Monocytes Absolute: 0.3 10*3/uL (ref 0.1–1.0)
Monocytes Relative: 9 %
Neutro Abs: 2.2 10*3/uL (ref 1.7–7.7)
Neutrophils Relative %: 66 %
Platelets: 122 10*3/uL — ABNORMAL LOW (ref 150–400)
RBC: 4.23 MIL/uL (ref 3.87–5.11)
RDW: 16.6 % — ABNORMAL HIGH (ref 11.5–15.5)
WBC: 3.4 10*3/uL — ABNORMAL LOW (ref 4.0–10.5)
nRBC: 0 % (ref 0.0–0.2)

## 2023-09-09 LAB — COMPREHENSIVE METABOLIC PANEL WITH GFR
ALT: 10 U/L (ref 0–44)
AST: 21 U/L (ref 15–41)
Albumin: 3.2 g/dL — ABNORMAL LOW (ref 3.5–5.0)
Alkaline Phosphatase: 104 U/L (ref 38–126)
Anion gap: 7 (ref 5–15)
BUN: 19 mg/dL (ref 8–23)
CO2: 26 mmol/L (ref 22–32)
Calcium: 9 mg/dL (ref 8.9–10.3)
Chloride: 103 mmol/L (ref 98–111)
Creatinine, Ser: 0.82 mg/dL (ref 0.44–1.00)
GFR, Estimated: >60 mL/min (ref >60)
Glucose, Bld: 128 mg/dL — ABNORMAL HIGH (ref 70–99)
Potassium: 4 mmol/L (ref 3.5–5.1)
Sodium: 136 mmol/L (ref 135–145)
Total Bilirubin: 0.7 mg/dL (ref 0.0–1.2)
Total Protein: 6.2 g/dL — ABNORMAL LOW (ref 6.5–8.1)

## 2023-09-09 LAB — TSH: TSH: 8.385 u[IU]/mL — ABNORMAL HIGH (ref 0.350–4.500)

## 2023-09-09 LAB — MAGNESIUM: Magnesium: 1.7 mg/dL (ref 1.7–2.4)

## 2023-09-09 LAB — TROPONIN I (HIGH SENSITIVITY): Troponin I (High Sensitivity): 9 ng/L (ref <18)

## 2023-09-09 MED ORDER — CETIRIZINE HCL 10 MG/ML IV SOLN
5.0000 mg | Freq: Once | INTRAVENOUS | Status: AC
  Administered 2023-09-09: 5 mg via INTRAVENOUS
  Filled 2023-09-09: qty 1

## 2023-09-09 MED ORDER — FAMOTIDINE IN NACL 20-0.9 MG/50ML-% IV SOLN
20.0000 mg | Freq: Once | INTRAVENOUS | Status: AC
  Administered 2023-09-09: 20 mg via INTRAVENOUS
  Filled 2023-09-09: qty 50

## 2023-09-09 MED ORDER — SODIUM CHLORIDE 0.9 % IV SOLN
Freq: Once | INTRAVENOUS | Status: AC
Start: 2023-09-09 — End: 2023-09-09

## 2023-09-09 MED ORDER — SODIUM CHLORIDE FLUSH 0.9 % IV SOLN
10.0000 mL | Freq: Once | INTRAVENOUS | Status: AC
Start: 2023-09-09 — End: 2023-09-09
  Administered 2023-09-09: 10 mL via INTRAVENOUS
  Filled 2023-09-09: qty 10

## 2023-09-09 MED ORDER — HEPARIN SOD (PORK) LOCK FLUSH 100 UNIT/ML IV SOLN
500.0000 [IU] | Freq: Once | INTRAVENOUS | Status: AC | PRN
  Administered 2023-09-09: 500 [IU]

## 2023-09-09 MED ORDER — SODIUM CHLORIDE 0.9 % IV SOLN
200.0000 mg | Freq: Once | INTRAVENOUS | Status: AC
  Administered 2023-09-09: 200 mg via INTRAVENOUS
  Filled 2023-09-09: qty 8

## 2023-09-09 MED ORDER — SODIUM CHLORIDE 0.9 % IV SOLN
1.0000 mg/kg | Freq: Once | INTRAVENOUS | Status: AC
  Administered 2023-09-09: 70 mg via INTRAVENOUS
  Filled 2023-09-09: qty 14

## 2023-09-09 MED ORDER — SODIUM CHLORIDE 0.9% FLUSH
10.0000 mL | INTRAVENOUS | Status: DC | PRN
  Administered 2023-09-09: 10 mL

## 2023-09-09 NOTE — Progress Notes (Signed)
 [facility] [address] [place] [address]    Clinic Day:  09/09/2023  Referring physician: Veda Gerald, MD  Patient Care Team: Veda Gerald, MD as PCP - General (Internal Medicine) Cody Das, MD as PCP - Cardiology (Cardiology) Paulett Boros, MD as Medical Oncologist (Medical Oncology) Gerhard Knuckles, RN as Oncology Nurse Navigator (Oncology)   ASSESSMENT & PLAN:   Assessment: 1. Malignant melanoma of right posterior leg (pT4 pN3 M1): - She noticed fleshy lesion 4 months ago on the right leg. - Her PMD did a biopsy which was consistent with melanoma. - Wide local excision and lymph node biopsy by Dr. Cherlynn Cornfield on 02/12/2021. - Pathology margins free, nodular type, Breslow's thickness 9.52 mm, Clark level V, deep margins free, ulceration absent, satellitosis absent, mitotic index 2/millimeters squared, LVI negative.  Neurotropism absent.  Tumor infiltrating lymphocytes absent.  Tumor regression not noted.  3 out of 3 positive sentinel lymph nodes for melanoma. - PET CT scan on 03/22/2021 with 3 foci of intense radiotracer activity within the right lower extremity.  In the medial right upper thigh nodule just superficial to thigh musculature measuring 11 mm with SUV 7.6.  Intramedullary hypermetabolic activity within the shaft of the mid right femur with SUV 14.6.  Intense activity associated with condylar notch of the distal right femur, appears to localize to soft tissue rather than the bone.  More diffuse activity associated with the medial right calf related to excision biopsy.  There is a focus of activity adjacent to the lateral margin of the right iliac wing SUV 4.1.  No evidence of visceral metastatic disease in the chest, abdomen or pelvis. - MRI of the right hip and right knee from 04/16/2021 with solitary bone metastasis in the distal right femoral diaphysis and multiple nodal metastatic disease anteromedially in the proximal right mid  thigh. - NGS testing-BRAF negative, positive for NRAS pathogenic variant exon 3, TERT promoter.  MSI-stable.  MMR-proficient.  NTRK 1/2/3 fusion not detected.  KIT mutation negative.  PD-L1 negative. - Nivolumab -Relatlimab  every 28 days started on 05/09/2021.  Held permanently after first dose due to myocarditis. - PET scan on 07/25/2021: Slight progression with no new sites of metastatic disease. - Pembrolizumab  started on 09/03/2021.  XRT to the soft tissue nodule within the medial right thigh and midshaft right femoral lesion from 03/26/2022 through 04/09/2022. -We have discussed further options including adding a low-dose ipilimumab  to pembrolizumab  which she is already receiving without any side effects.  I have quoted response rates around 30% (J Clin Oncol. 2021 Aug 20;39(24):2647-2655. doi: 10.1200/JCO.21.00079).  We also discussed the possibility increased chance of immunotherapy related side effects with combination of PD-1 and anti-CTL A4 antibody. - We also discussed option of binimetinib as she has NRAS mutation on previous NGS testing.  However this option is also associated with significant side effects and overall response rate of around 20%. - We also discussed other options including adding lenvatinib to pembrolizumab , least favorable option due to lack of robust data.  We have discussed chemotherapy options with dacarbazine/temozolomide among others. - Pembrolizumab  with low-dose ipilimumab  started on 01/14/2023 - Guardant360 (12/2022): NRAS Q61R (?  Binimetinib), T p53 C17 6Y, TERT promoter SNV, TMB 20.8, MSI high not detected. - PET scan (05/21/2022): Postsurgical changes in the gallbladder bed as well as 2 soft tissue nodules in the right upper quadrant of the abdomen.  Stable distal right femur and soft tissue nodule within the medial right  thigh.  New focal area of increased uptake in the anterior inferior aspect of L3 vertebral body with no CT correlate.  Multiple right-sided pelvic  lymph nodes, new from August 2024 PET scan but stable from November 2020 for CT scan. - She had only received 4 total treatments with pembrolizumab  and low-dose ipilimumab  since September.  PET scan showed mild progression.  Considering alternative treatments available (temozolomide, carboplatin plus paclitaxel), we have decided to continue pembrolizumab  and low-dose ipilimumab  for another 3 cycles and rescan her.  She and her son are agreeable. - Guardant360 (06/09/2023): NRAS Q61R (binimetinib), PTEN deletion, exon 3-9 (Capivasertib), PTEN deletion exon 1-6 (Capivasertib), T p53, TERT promoter SNV -PET scan (07/23/2023): Mixed response with enlarging hypermetabolic right inguinal lymph node and small new L5 metastasis.  Stable/decreased hypermetabolic involving right upper quadrant peritoneal nodules, right iliac adenopathy, L5 and right femoral metastasis and treated soft tissue lesion in the medial right thigh. - We had a prolonged discussion about further treatment options.  We talked about further options including Abraxane (days 1, 8, 15 every 28 days) and binimetinib.  Due to the adverse side effect profile particularly cardiac, not inclined towards binimetinib.  She is not keen on chemotherapy at this time.  Will continue immunotherapy.  Will seek opinion from radiation oncology to see if they can radiate the right groin lymph node as she has oligo progression.   2. Social/family history: - She is seen with her son today.  She lives by herself at home.  She is independent of ADLs and IADLs.  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  She is non-smoker. - Father had colon cancer.  Sister had breast cancer and colon cancer.  Maternal aunt had breast cancer.  Paternal uncle had colon cancer.    Plan: 1. Malignant melanoma of the right posterior leg (pT4 pN3 M1), BRAF V600 negative: - She has completed XRT to the right inguinal lymph node, 5 fractions on  08/31/2023. - She reported worsening diarrhea since radiation therapy.  She had 2 episodes of diarrhea yesterday and took Imodium 2 tablets.  She did not have any further diarrhea. - She also reported itching on the back of the neck, below the breasts and stomach area without rash.  She was told to apply moisturizing lotion twice daily. - We reviewed labs today: Normal LFTs.  CBC grossly normal.  May proceed with treatment today and in 3 weeks.  RTC 6 weeks for follow-up.  Will plan on repeating PET scan whole-body prior to next visit.  2.  History of immunotherapy myocarditis: - She does not report any chest pains.  Troponin today is 9.  3.  Right medial thigh pain: - On and off sharp pain is stable.  PET scan showed stable hypermetabolism.  4.  Hypothyroidism: - She is taking Synthroid  100 mcg daily.  TSH today increased 8.3 from 1.9 on 08/19/2023.  T4 is normal.  Will closely monitor.   5.  Normocytic anemia: - She was taking iron tablet Monday Wednesday and Friday.  However she developed slight diarrhea which has worsened after radiation to the right inguinal lymph node.  She stopped taking iron tablets.    No orders of the defined types were placed in this encounter.    Paulett Boros, MD   4/30/20259:11 AM  CHIEF COMPLAINT:   Diagnosis: metastatic malignant melanoma, BRAF V600 negative    Cancer Staging  Malignant melanoma of lower leg, right (HCC) Staging  form: Melanoma of the Skin, AJCC 8th Edition - Clinical stage from 03/27/2021: Stage IV (cT4a, cN3, cM1) - Unsigned    Prior Therapy: none  Current Therapy:  Pembrolizumab  (200) q21d Keytruda     HISTORY OF PRESENT ILLNESS:   Oncology History  Malignant melanoma of lower leg, right (HCC)  03/27/2021 Initial Diagnosis   Malignant melanoma of lower leg, right (HCC)   05/09/2021 - 05/09/2021 Chemotherapy   Patient is on Treatment Plan : MELANOMA - Opdualag  (nivolumab /relatlimab ) q28d     09/03/2021 - 01/07/2022  Chemotherapy   Patient is on Treatment Plan : MELANOMA Pembrolizumab  (200) q21d     09/03/2021 -  Chemotherapy   Patient is on Treatment Plan : MELANOMA Pembrolizumab  (200) + Yervoy  (1mg /kg) q21d        INTERVAL HISTORY:   Elizabeth Norman is a 83 y.o. female seen for follow-up of metastatic malignant melanoma.  Reports appetite 100% and energy levels 50%.  PAST MEDICAL HISTORY:   Past Medical History: Past Medical History:  Diagnosis Date   Arthritis    Hypertension    Malignant melanoma of lower leg, right (HCC) 03/27/2021   Port-A-Cath in place 04/30/2021    Surgical History: Past Surgical History:  Procedure Laterality Date   BIOPSY  04/02/2023   Procedure: BIOPSY;  Surgeon: Asencion Blacksmith, MD;  Location: [facility] ENDOSCOPY;  Service: Gastroenterology;;   CATARACT EXTRACTION W/PHACO Right 10/03/2014   Procedure: CATARACT EXTRACTION PHACO AND INTRAOCULAR LENS PLACEMENT (IOC);  Surgeon: Albert Huff, MD;  Location: [facility] ORS;  Service: Ophthalmology;  Laterality: Right;  CDE:10.09   CATARACT EXTRACTION W/PHACO Left 10/10/2014   Procedure: CATARACT EXTRACTION PHACO AND INTRAOCULAR LENS PLACEMENT (IOC);  Surgeon: Albert Huff, MD;  Location: [facility] ORS;  Service: Ophthalmology;  Laterality: Left;  CDE:8.69   CORNEAL 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   CORNEAL TRANSPLANT Left 2020   DENTAL RESTORATION/EXTRACTION WITH X-RAY     ERCP N/A 04/02/2023   Procedure: ENDOSCOPIC RETROGRADE CHOLANGIOPANCREATOGRAPHY (ERCP);  Surgeon: Asencion Blacksmith, MD;  Location: [facility] ENDOSCOPY;  Service: Gastroenterology;  Laterality: N/A;   IR IMAGING GUIDED PORT INSERTION  04/05/2021   LEFT HEART CATH AND CORONARY ANGIOGRAPHY N/A 05/28/2021   Procedure: LEFT HEART CATH AND CORONARY ANGIOGRAPHY;  Surgeon: Cody Das, MD;  Location: [facility] INVASIVE CV LAB;  Service: Cardiovascular;  Laterality: N/A;   MELANOMA EXCISION Right 02/12/2021   Procedure: WIDE LOCAL EXCISION RIGHT LOWER LEG MELANOMA , ADVANCEMENT FLAP CLOSURE DEFECT;   Surgeon: Lockie Rima, MD;  Location: [facility] OR;  Service: General;  Laterality: Right;   REMOVAL OF STONES  04/02/2023   Procedure: REMOVAL OF STONES;  Surgeon: Asencion Blacksmith, MD;  Location: [facility] ENDOSCOPY;  Service: Gastroenterology;;   Daryle Eon  04/02/2023   Procedure: Daryle Eon;  Surgeon: Asencion Blacksmith, MD;  Location: [facility] ENDOSCOPY;  Service: Gastroenterology;;   SENTINEL NODE BIOPSY Right 02/12/2021   Procedure: SENTINEL NODE BIOPSY;  Surgeon: Lockie Rima, MD;  Location: [facility] OR;  Service: General;  Laterality: Right;   SPHINCTEROTOMY  04/02/2023   Procedure: SPHINCTEROTOMY;  Surgeon: Asencion Blacksmith, MD;  Location: [facility] ENDOSCOPY;  Service: Gastroenterology;;    Social History: Social History   Socioeconomic History   Marital status: Widowed    Spouse name: Not on file   Number of children: 3   Years of education: Not on file   Highest education level: Not on file  Occupational History   Not on file  Tobacco Use   Smoking status: Never   Smokeless tobacco: Never  Vaping Use   Vaping status: Never Used  Substance and Sexual Activity   Alcohol use: No   Drug use: No   Sexual activity: Not on file  Other Topics Concern   Not on file  Social History Narrative   Not on file   Social Drivers of Health   Financial Resource Strain: Not on file  Food Insecurity: No Food Insecurity (08/04/2023)   Received from [facility]   Hunger Vital Sign    Worried About Running Out of Food in the Last Year: Never true    Ran Out of Food in the Last Year: Never true  Transportation Needs: No Transportation Needs (08/04/2023)   Received from [facility] - Transportation    Lack of Transportation (Medical): No    Lack of Transportation (Non-Medical): No  Physical Activity: Not on file  Stress: Not on file  Social Connections: Not on file  Intimate Partner Violence: Not At Risk (04/29/2023)   Humiliation, Afraid, Rape, and Kick questionnaire    Fear of Current  or Ex-Partner: No    Emotionally Abused: No    Physically Abused: No    Sexually Abused: No    Family History: Family History  Problem Relation Age of Onset   Cancer Father    Hypertension Sister    Hypertension Sister     Current Medications:  Current Outpatient Medications:    ALPRAZolam  (XANAX ) 0.5 MG tablet, Take 0.5 mg by mouth 3 (three) times daily., Disp: , Rfl: 2   calcium  carbonate (OSCAL) 1500 (600 Ca) MG TABS tablet, Take 600 mg of elemental calcium  by mouth 3 (three) times a week., Disp: , Rfl:    cholecalciferol (VITAMIN D) 1000 UNITS tablet, Take 1,000 Units by mouth 3 (three) times a week., Disp: , Rfl:    Cyanocobalamin (VITAMIN B-12 PO), Take 1 tablet by mouth once a week., Disp: , Rfl:    diphenhydramine -acetaminophen  (TYLENOL  PM) 25-500 MG TABS tablet, Take 1 tablet by mouth at bedtime as needed., Disp: , Rfl:    levothyroxine  (SYNTHROID ) 100 MCG tablet, Take 1 tablet (100 mcg total) by mouth daily before breakfast., Disp: 30 tablet, Rfl: 3   metoprolol  tartrate (LOPRESSOR ) 25 MG tablet, Take 1 tablet (25 mg total) by mouth 2 (two) times daily. Hold until follow-up with your doctor/cardiologist, Disp: , Rfl:    ondansetron  (ZOFRAN -ODT) 4 MG disintegrating tablet, Take 1 tablet (4 mg total) by mouth every 6 (six) hours as needed for nausea., Disp: 20 tablet, Rfl: 0   prednisoLONE  acetate (PRED FORTE ) 1 % ophthalmic suspension, Place 1 drop into both eyes daily., Disp: , Rfl:    Allergies: Allergies  Allergen Reactions   Tape     Pulls skin, please use paper tape    REVIEW OF SYSTEMS:   Review of Systems  Constitutional:  Negative for chills, fatigue and fever.  HENT:   Negative for lump/mass, mouth sores, nosebleeds, sore throat and trouble swallowing.   Eyes:  Negative for eye problems.  Respiratory:  Negative for cough and shortness of breath.   Cardiovascular:  Negative for chest pain, leg swelling and palpitations.  Gastrointestinal:  Positive for  diarrhea. Negative for abdominal pain, constipation, nausea and vomiting.  Genitourinary:  Negative for bladder incontinence, difficulty urinating, dysuria, frequency, hematuria and nocturia.   Musculoskeletal:  Negative for arthralgias, back pain, flank pain, myalgias and neck pain.  Skin:  Negative for itching and rash.  Neurological:  Positive for numbness. Negative  for dizziness and headaches.  Hematological:  Does not bruise/bleed easily.  Psychiatric/Behavioral:  Positive for sleep disturbance. Negative for depression and suicidal ideas. The patient is not nervous/anxious.   All other systems reviewed and are negative.    VITALS:   There were no vitals taken for this visit.  Wt Readings from Last 3 Encounters:  08/19/23 155 lb 11.2 oz (70.6 kg)  07/29/23 155 lb 6.8 oz (70.5 kg)  07/08/23 154 lb 12.2 oz (70.2 kg)    There is no height or weight on file to calculate BMI.  Performance status (ECOG): 1 - Symptomatic but completely ambulatory  PHYSICAL EXAM:   Physical Exam Vitals and nursing note reviewed. Exam conducted with a chaperone present.  Constitutional:      Appearance: Normal appearance.  Cardiovascular:     Rate and Rhythm: Normal rate and regular rhythm.     Pulses: Normal pulses.     Heart sounds: Normal heart sounds.  Pulmonary:     Effort: Pulmonary effort is normal.     Breath sounds: Normal breath sounds.  Abdominal:     Palpations: Abdomen is soft. There is no hepatomegaly, splenomegaly or mass.     Tenderness: There is no abdominal tenderness.  Musculoskeletal:     Right lower leg: No edema.     Left lower leg: No edema.  Lymphadenopathy:     Cervical: No cervical adenopathy.     Right cervical: No superficial, deep or posterior cervical adenopathy.    Left cervical: No superficial, deep or posterior cervical adenopathy.     Upper Body:     Right upper body: No supraclavicular or axillary adenopathy.     Left upper body: No supraclavicular or  axillary adenopathy.  Neurological:     General: No focal deficit present.     Mental Status: She is alert and oriented to person, place, and time.  Psychiatric:        Mood and Affect: Mood normal.        Behavior: Behavior normal.     LABS:   CBC     Component Value Date/Time   WBC 3.4 (L) 09/09/2023 0833   RBC 4.23 09/09/2023 0833   HGB 11.2 (L) 09/09/2023 0833   HCT 37.1 09/09/2023 0833   PLT 122 (L) 09/09/2023 0833   MCV 87.7 09/09/2023 0833   MCH 26.5 09/09/2023 0833   MCHC 30.2 09/09/2023 0833   RDW 16.6 (H) 09/09/2023 0833   LYMPHSABS 0.7 09/09/2023 0833   MONOABS 0.3 09/09/2023 0833   EOSABS 0.2 09/09/2023 0833   BASOSABS 0.0 09/09/2023 0833    CMP      Component Value Date/Time   NA 136 09/09/2023 0833   K 4.0 09/09/2023 0833   CL 103 09/09/2023 0833   CO2 26 09/09/2023 0833   GLUCOSE 128 (H) 09/09/2023 0833   BUN 19 09/09/2023 0833   CREATININE 0.82 09/09/2023 0833   CALCIUM  9.0 09/09/2023 0833   PROT 6.2 (L) 09/09/2023 0833   ALBUMIN 3.2 (L) 09/09/2023 0833   AST 21 09/09/2023 0833   ALT 10 09/09/2023 0833   ALKPHOS 104 09/09/2023 0833   BILITOT 0.7 09/09/2023 0833   GFRNONAA >60 09/09/2023 0833   GFRAA >60 09/29/2014 1237     No results found for: "CEA1", "CEA" / No results found for: "CEA1", "CEA" No results found for: "PSA1" No results found for: "JYN829" No results found for: "CAN125"  No results found for: "TOTALPROTELP", "ALBUMINELP", "A1GS", "A2GS", "BETS", "BETA2SER", "  GAMS", "MSPIKE", "SPEI" Lab Results  Component Value Date   TIBC 312 06/17/2023   FERRITIN 20 06/17/2023   IRONPCTSAT 10 (L) 06/17/2023   Lab Results  Component Value Date   LDH 348 (H) 03/30/2023   LDH 137 06/05/2022   LDH 144 05/13/2022     STUDIES:   No results found.

## 2023-09-09 NOTE — Patient Instructions (Signed)
 [facility] [place] - A DEPT OF [facility]  Discharge Instructions: Thank you for choosing [facility] to provide your oncology and hematology care.  If you have a lab appointment with the Cancer Center - please note that after April 8th, 2024, all labs will be drawn in the cancer center.  You do not have to check in or register with the main entrance as you have in the past but will complete your check-in in the cancer center.  Wear comfortable clothing and clothing appropriate for easy access to any Portacath or PICC line.   We strive to give you quality time with your provider. You may need to reschedule your appointment if you arrive late (15 or more minutes).  Arriving late affects you and other patients whose appointments are after yours.  Also, if you miss three or more appointments without notifying the office, you may be dismissed from the clinic at the provider's discretion.      For prescription refill requests, have your pharmacy contact our office and allow 72 hours for refills to be completed.    Today you received the following chemotherapy and/or immunotherapy agents Keytruda/Yervoy.  Pembrolizumab Injection What is this medication? PEMBROLIZUMAB (PEM broe LIZ ue mab) treats some types of cancer. It works by helping your immune system slow or stop the spread of cancer cells. It is a monoclonal antibody. This medicine may be used for other purposes; ask your health care provider or pharmacist if you have questions. COMMON BRAND NAME(S): Keytruda What should I tell my care team before I take this medication? They need to know if you have any of these conditions: Allogeneic stem cell transplant (uses someone else's stem cells) Autoimmune diseases, such as Crohn disease, ulcerative colitis, lupus History of chest radiation Nervous system problems, such as Guillain-Barre syndrome, myasthenia gravis Organ transplant An unusual or allergic  reaction to pembrolizumab, other medications, foods, dyes, or preservatives Pregnant or trying to get pregnant Breast-feeding How should I use this medication? This medication is injected into a vein. It is given by your care team in a hospital or clinic setting. A special MedGuide will be given to you before each treatment. Be sure to read this information carefully each time. Talk to your care team about the use of this medication in children. While it may be prescribed for children as young as 6 months for selected conditions, precautions do apply. Overdosage: If you think you have taken too much of this medicine contact a poison control center or emergency room at once. NOTE: This medicine is only for you. Do not share this medicine with others. What if I miss a dose? Keep appointments for follow-up doses. It is important not to miss your dose. Call your care team if you are unable to keep an appointment. What may interact with this medication? Interactions have not been studied. This list may not describe all possible interactions. Give your health care provider a list of all the medicines, herbs, non-prescription drugs, or dietary supplements you use. Also tell them if you smoke, drink alcohol, or use illegal drugs. Some items may interact with your medicine. What should I watch for while using this medication? Your condition will be monitored carefully while you are receiving this medication. You may need blood work while taking this medication. This medication may cause serious skin reactions. They can happen weeks to months after starting the medication. Contact your care team right away if you  notice fevers or flu-like symptoms with a rash. The rash may be red or purple and then turn into blisters or peeling of the skin. You may also notice a red rash with swelling of the face, lips, or lymph nodes in your neck or under your arms. Tell your care team right away if you have any change in  your eyesight. Talk to your care team if you may be pregnant. Serious birth defects can occur if you take this medication during pregnancy and for 4 months after the last dose. You will need a negative pregnancy test before starting this medication. Contraception is recommended while taking this medication and for 4 months after the last dose. Your care team can help you find the option that works for you. Do not breastfeed while taking this medication and for 4 months after the last dose. What side effects may I notice from receiving this medication? Side effects that you should report to your care team as soon as possible: Allergic reactions--skin rash, itching, hives, swelling of the face, lips, tongue, or throat Dry cough, shortness of breath or trouble breathing Eye pain, redness, irritation, or discharge with blurry or decreased vision Heart muscle inflammation--unusual weakness or fatigue, shortness of breath, chest pain, fast or irregular heartbeat, dizziness, swelling of the ankles, feet, or hands Hormone gland problems--headache, sensitivity to light, unusual weakness or fatigue, dizziness, fast or irregular heartbeat, increased sensitivity to cold or heat, excessive sweating, constipation, hair loss, increased thirst or amount of urine, tremors or shaking, irritability Infusion reactions--chest pain, shortness of breath or trouble breathing, feeling faint or lightheaded Kidney injury (glomerulonephritis)--decrease in the amount of urine, red or dark Satchel Heidinger urine, foamy or bubbly urine, swelling of the ankles, hands, or feet Liver injury--right upper belly pain, loss of appetite, nausea, light-colored stool, dark yellow or Barbi Kumagai urine, yellowing skin or eyes, unusual weakness or fatigue Pain, tingling, or numbness in the hands or feet, muscle weakness, change in vision, confusion or trouble speaking, loss of balance or coordination, trouble walking, seizures Rash, fever, and swollen lymph  nodes Redness, blistering, peeling, or loosening of the skin, including inside the mouth Sudden or severe stomach pain, bloody diarrhea, fever, nausea, vomiting Side effects that usually do not require medical attention (report to your care team if they continue or are bothersome): Bone, joint, or muscle pain Diarrhea Fatigue Loss of appetite Nausea Skin rash This list may not describe all possible side effects. Call your doctor for medical advice about side effects. You may report side effects to FDA at 1-800-FDA-1088. Where should I keep my medication? This medication is given in a hospital or clinic. It will not be stored at home. NOTE: This sheet is a summary. It may not cover all possible information. If you have questions about this medicine, talk to your doctor, pharmacist, or health care provider.  2024 Elsevier/Gold Standard (2021-09-10 00:00:00)       To help prevent nausea and vomiting after your treatment, we encourage you to take your nausea medication as directed.  BELOW ARE SYMPTOMS THAT SHOULD BE REPORTED IMMEDIATELY: *FEVER GREATER THAN 100.4 F (38 C) OR HIGHER *CHILLS OR SWEATING *NAUSEA AND VOMITING THAT IS NOT CONTROLLED WITH YOUR NAUSEA MEDICATION *UNUSUAL SHORTNESS OF BREATH *UNUSUAL BRUISING OR BLEEDING *URINARY PROBLEMS (pain or burning when urinating, or frequent urination) *BOWEL PROBLEMS (unusual diarrhea, constipation, pain near the anus) TENDERNESS IN MOUTH AND THROAT WITH OR WITHOUT PRESENCE OF ULCERS (sore throat, sores in mouth, or a toothache) UNUSUAL RASH,  SWELLING OR PAIN  UNUSUAL VAGINAL DISCHARGE OR ITCHING   Items with * indicate a potential emergency and should be followed up as soon as possible or go to the Emergency Department if any problems should occur.  Please show the CHEMOTHERAPY ALERT CARD or IMMUNOTHERAPY ALERT CARD at check-in to the Emergency Department and triage nurse.  Should you have questions after your visit or need to  cancel or reschedule your appointment, please contact [facility] [place] - A DEPT OF Eligha Bridegroom [facility] [phone]  and follow the prompts.  Office hours are 8:00 a.m. to 4:30 p.m. Monday - Friday. Please note that voicemails left after 4:00 p.m. may not be returned until the following business day.  We are closed weekends and major holidays. You have access to a nurse at all times for urgent questions. Please call the main number to the clinic [phone] and follow the prompts.  For any non-urgent questions, you may also contact your provider using MyChart. We now offer e-Visits for anyone 33 and older to request care online for non-urgent symptoms. For details visit [email or web address].   Also download the MyChart app! Go to the app store, search "MyChart", open the app, select [facility], and log in with your MyChart username and password.

## 2023-09-09 NOTE — Progress Notes (Signed)
 Ipilimumab  (YERVOY ) Patient Monitoring Assessment   Is the patient experiencing any of the following general symptoms?:  [ ] Difficulty performing normal activities [ ] Feeling sluggish or cold all the time [ ] Unusual weight gain [ ] Constant or unusual headaches [ ] Feeling dizzy or faint [ ] Changes in eyesight (blurry vision, double vision, or other vision problems) [ ] Changes in mood or behavior (ex: decreased sex drive, irritability, or forgetfulness) [ ] Starting new medications (ex: steroids, other medications that lower immune response) [ X]Patient is not experiencing any of the general symptoms above.   Gastrointestinal  Patient is having 4-5 bowel movements each day.  Is this different from baseline? [ X]Yes [ ] No (RADIATION THERAPY) Are your stools watery or do they have a foul smell? [X ]Yes [ ] No Have you seen blood in your stools? [ ] Yes [ X]No Are your stools dark, tarry, or sticky? [ ] Yes [ X]No Are you having pain or tenderness in your belly? [ ] Yes [ X]No  Skin Does your skin itch? [ X]Yes [ ] No Do you have a rash? [ ] Yes [ X]No Has your skin blistered and/or peeled? [ ] Yes [X ]No Do you have sores in your mouth? [ ] Yes [X ]No  Hepatic Has your urine been dark or tea colored? [ ] Yes [ X]No Have you noticed that your skin or the whites of your eyes are turning yellow? [ ] Yes [ ] XNo Are you bleeding or bruising more easily than normal? [ ] Yes [ X]No Are you nauseous and/or vomiting? [ ] Yes [ X]No Do you have pain on the right side of your stomach? [ ] Yes [ X]No  Neurologic  Are you having unusual weakness of legs, arms, or face? [ ] Yes [ X]No Are you having numbness or tingling in your hands or feet? [ ] Yes [X ]No  Karoline Pacific

## 2023-09-09 NOTE — Patient Instructions (Signed)

## 2023-09-09 NOTE — Progress Notes (Signed)
Patient presents today for chemotherapy infusion. Patient is in satisfactory condition with no new complaints voiced.  Vital signs are stable.  Labs reviewed by Dr. Katragadda during the office visit and all labs are within treatment parameters.  We will proceed with treatment per MD orders.   Patient tolerated treatment well with no complaints voiced.  Patient left ambulatory in stable condition.  Vital signs stable at discharge.  Follow up as scheduled.       

## 2023-09-10 LAB — T4: T4, Total: 7.7 ug/dL (ref 4.5–12.0)

## 2023-09-19 ENCOUNTER — Other Ambulatory Visit: Payer: Self-pay

## 2023-09-20 ENCOUNTER — Other Ambulatory Visit: Payer: Self-pay

## 2023-09-29 DIAGNOSIS — N182 Chronic kidney disease, stage 2 (mild): Secondary | ICD-10-CM | POA: Diagnosis not present

## 2023-09-29 DIAGNOSIS — D692 Other nonthrombocytopenic purpura: Secondary | ICD-10-CM | POA: Diagnosis not present

## 2023-09-29 DIAGNOSIS — E7849 Other hyperlipidemia: Secondary | ICD-10-CM | POA: Diagnosis not present

## 2023-09-29 DIAGNOSIS — C439 Malignant melanoma of skin, unspecified: Secondary | ICD-10-CM | POA: Diagnosis not present

## 2023-09-29 DIAGNOSIS — M818 Other osteoporosis without current pathological fracture: Secondary | ICD-10-CM | POA: Diagnosis not present

## 2023-09-29 DIAGNOSIS — I10 Essential (primary) hypertension: Secondary | ICD-10-CM | POA: Diagnosis not present

## 2023-09-29 DIAGNOSIS — E038 Other specified hypothyroidism: Secondary | ICD-10-CM | POA: Diagnosis not present

## 2023-09-30 ENCOUNTER — Encounter: Payer: Self-pay | Admitting: Hematology

## 2023-09-30 ENCOUNTER — Inpatient Hospital Stay: Attending: Hematology

## 2023-09-30 ENCOUNTER — Inpatient Hospital Stay

## 2023-09-30 VITALS — BP 123/47 | HR 65 | Temp 97.7°F | Resp 18 | Wt 154.3 lb

## 2023-09-30 DIAGNOSIS — Z95828 Presence of other vascular implants and grafts: Secondary | ICD-10-CM

## 2023-09-30 DIAGNOSIS — C4371 Malignant melanoma of right lower limb, including hip: Secondary | ICD-10-CM

## 2023-09-30 DIAGNOSIS — D649 Anemia, unspecified: Secondary | ICD-10-CM | POA: Diagnosis not present

## 2023-09-30 DIAGNOSIS — Z79620 Long term (current) use of immunosuppressive biologic: Secondary | ICD-10-CM | POA: Insufficient documentation

## 2023-09-30 DIAGNOSIS — C7951 Secondary malignant neoplasm of bone: Secondary | ICD-10-CM | POA: Diagnosis not present

## 2023-09-30 DIAGNOSIS — Z5112 Encounter for antineoplastic immunotherapy: Secondary | ICD-10-CM | POA: Insufficient documentation

## 2023-09-30 DIAGNOSIS — Z923 Personal history of irradiation: Secondary | ICD-10-CM | POA: Diagnosis not present

## 2023-09-30 LAB — TSH: TSH: 5.372 u[IU]/mL — ABNORMAL HIGH (ref 0.350–4.500)

## 2023-09-30 LAB — COMPREHENSIVE METABOLIC PANEL WITH GFR
ALT: 13 U/L (ref 0–44)
AST: 21 U/L (ref 15–41)
Albumin: 3.6 g/dL (ref 3.5–5.0)
Alkaline Phosphatase: 112 U/L (ref 38–126)
Anion gap: 8 (ref 5–15)
BUN: 16 mg/dL (ref 8–23)
CO2: 26 mmol/L (ref 22–32)
Calcium: 9 mg/dL (ref 8.9–10.3)
Chloride: 99 mmol/L (ref 98–111)
Creatinine, Ser: 0.76 mg/dL (ref 0.44–1.00)
GFR, Estimated: >60 mL/min (ref >60)
Glucose, Bld: 102 mg/dL — ABNORMAL HIGH (ref 70–99)
Potassium: 3.9 mmol/L (ref 3.5–5.1)
Sodium: 133 mmol/L — ABNORMAL LOW (ref 135–145)
Total Bilirubin: 0.7 mg/dL (ref 0.0–1.2)
Total Protein: 6.8 g/dL (ref 6.5–8.1)

## 2023-09-30 LAB — CBC WITH DIFFERENTIAL/PLATELET
Abs Immature Granulocytes: 0.01 10*3/uL (ref 0.00–0.07)
Basophils Absolute: 0 10*3/uL (ref 0.0–0.1)
Basophils Relative: 1 %
Eosinophils Absolute: 0.1 10*3/uL (ref 0.0–0.5)
Eosinophils Relative: 3 %
HCT: 37.3 % (ref 36.0–46.0)
Hemoglobin: 11.9 g/dL — ABNORMAL LOW (ref 12.0–15.0)
Immature Granulocytes: 0 %
Lymphocytes Relative: 21 %
Lymphs Abs: 0.8 10*3/uL (ref 0.7–4.0)
MCH: 27.7 pg (ref 26.0–34.0)
MCHC: 31.9 g/dL (ref 30.0–36.0)
MCV: 86.9 fL (ref 80.0–100.0)
Monocytes Absolute: 0.4 10*3/uL (ref 0.1–1.0)
Monocytes Relative: 10 %
Neutro Abs: 2.5 10*3/uL (ref 1.7–7.7)
Neutrophils Relative %: 65 %
Platelets: 141 10*3/uL — ABNORMAL LOW (ref 150–400)
RBC: 4.29 MIL/uL (ref 3.87–5.11)
RDW: 16.4 % — ABNORMAL HIGH (ref 11.5–15.5)
WBC: 3.9 10*3/uL — ABNORMAL LOW (ref 4.0–10.5)
nRBC: 0 % (ref 0.0–0.2)

## 2023-09-30 LAB — TROPONIN I (HIGH SENSITIVITY): Troponin I (High Sensitivity): 7 ng/L (ref <18)

## 2023-09-30 LAB — MAGNESIUM: Magnesium: 1.8 mg/dL (ref 1.7–2.4)

## 2023-09-30 MED ORDER — SODIUM CHLORIDE 0.9 % IV SOLN
1.0000 mg/kg | Freq: Once | INTRAVENOUS | Status: AC
  Administered 2023-09-30: 70 mg via INTRAVENOUS
  Filled 2023-09-30: qty 14

## 2023-09-30 MED ORDER — HEPARIN SOD (PORK) LOCK FLUSH 100 UNIT/ML IV SOLN
500.0000 [IU] | Freq: Once | INTRAVENOUS | Status: AC | PRN
Start: 2023-09-30 — End: 2023-09-30
  Administered 2023-09-30: 500 [IU]

## 2023-09-30 MED ORDER — FAMOTIDINE IN NACL 20-0.9 MG/50ML-% IV SOLN
20.0000 mg | Freq: Once | INTRAVENOUS | Status: AC
  Administered 2023-09-30: 20 mg via INTRAVENOUS
  Filled 2023-09-30: qty 50

## 2023-09-30 MED ORDER — CETIRIZINE HCL 10 MG/ML IV SOLN
5.0000 mg | Freq: Once | INTRAVENOUS | Status: AC
  Administered 2023-09-30: 5 mg via INTRAVENOUS
  Filled 2023-09-30: qty 1

## 2023-09-30 MED ORDER — SODIUM CHLORIDE 0.9% FLUSH
10.0000 mL | Freq: Once | INTRAVENOUS | Status: AC
  Administered 2023-09-30: 10 mL via INTRAVENOUS

## 2023-09-30 MED ORDER — SODIUM CHLORIDE 0.9% FLUSH
10.0000 mL | INTRAVENOUS | Status: DC | PRN
  Administered 2023-09-30: 10 mL

## 2023-09-30 MED ORDER — SODIUM CHLORIDE 0.9 % IV SOLN
200.0000 mg | Freq: Once | INTRAVENOUS | Status: AC
  Administered 2023-09-30: 200 mg via INTRAVENOUS
  Filled 2023-09-30: qty 8

## 2023-09-30 MED ORDER — SODIUM CHLORIDE 0.9 % IV SOLN: Freq: Once | INTRAVENOUS | Status: AC

## 2023-09-30 NOTE — Progress Notes (Signed)
 Ipilimumab (YERVOY) Patient Monitoring Assessment   Is the patient experiencing any of the following general symptoms?:  [ ] Difficulty performing normal activities [ ] Feeling sluggish or cold all the time [ ] Unusual weight gain [ ] Constant or unusual headaches [ ] Feeling dizzy or faint [ ] Changes in eyesight (blurry vision, double vision, or other vision problems) [ ] Changes in mood or behavior (ex: decreased sex drive, irritability, or forgetfulness) [ ] Starting new medications (ex: steroids, other medications that lower immune response) [ X]Patient is not experiencing any of the general symptoms above.   Gastrointestinal  Patient is having 1 bowel movements each day.  Is this different from baseline? [ ] Yes [X ]No Are your stools watery or do they have a foul smell? [ ] Yes [ X]No Have you seen blood in your stools? [ ] Yes [ X]No Are your stools dark, tarry, or sticky? [ ] Yes [ X]No Are you having pain or tenderness in your belly? [ ] Yes [ X]No  Skin Does your skin itch? [ ] Yes [X ]No Do you have a rash? [ ] Yes [ X]No Has your skin blistered and/or peeled? [ ] Yes [ X]No Do you have sores in your mouth? [ ] Yes [ X]No  Hepatic Has your urine been dark or tea colored? [ ] Yes [ X]No Have you noticed that your skin or the whites of your eyes are turning yellow? [ ] Yes [X ]No Are you bleeding or bruising more easily than normal? [ ] Yes [ X]No Are you nauseous and/or vomiting? [ ] Yes [ X]No Do you have pain on the right side of your stomach? [ ] Yes [ X]No  Neurologic  Are you having unusual weakness of legs, arms, or face? [ ] Yes [ X]No Are you having numbness or tingling in your hands or feet? [ ] Yes [X ]No  Peggye Pitt

## 2023-09-30 NOTE — Progress Notes (Signed)
Patient presents today for chemotherapy infusion.  Patient is in satisfactory condition with no new complaints voiced.  Vital signs are stable.  Labs reviewed and all labs are within treatment parameters. We will proceed with treatment per MD orders.   Patient tolerated treatment well with no complaints voiced.  Patient left ambulatory in stable condition.  Vital signs stable at discharge.  Follow up as scheduled.

## 2023-09-30 NOTE — Patient Instructions (Signed)
 [facility] [place] - A DEPT OF [facility]  Discharge Instructions: Thank you for choosing [facility] to provide your oncology and hematology care.  If you have a lab appointment with the Cancer Center - please note that after April 8th, 2024, all labs will be drawn in the cancer center.  You do not have to check in or register with the main entrance as you have in the past but will complete your check-in in the cancer center.  Wear comfortable clothing and clothing appropriate for easy access to any Portacath or PICC line.   We strive to give you quality time with your provider. You may need to reschedule your appointment if you arrive late (15 or more minutes).  Arriving late affects you and other patients whose appointments are after yours.  Also, if you miss three or more appointments without notifying the office, you may be dismissed from the clinic at the provider's discretion.      For prescription refill requests, have your pharmacy contact our office and allow 72 hours for refills to be completed.    Today you received the following chemotherapy and/or immunotherapy agents Yervoy /Keytruda .  Pembrolizumab  Injection What is this medication? PEMBROLIZUMAB  (PEM broe LIZ ue mab) treats some types of cancer. It works by helping your immune system slow or stop the spread of cancer cells. It is a monoclonal antibody. This medicine may be used for other purposes; ask your health care provider or pharmacist if you have questions. COMMON BRAND NAME(S): Keytruda  What should I tell my care team before I take this medication? They need to know if you have any of these conditions: Allogeneic stem cell transplant (uses someone else's stem cells) Autoimmune diseases, such as Crohn disease, ulcerative colitis, lupus History of chest radiation Nervous system problems, such as Guillain-Barre syndrome, myasthenia gravis Organ transplant An unusual or allergic  reaction to pembrolizumab , other medications, foods, dyes, or preservatives Pregnant or trying to get pregnant Breast-feeding How should I use this medication? This medication is injected into a vein. It is given by your care team in a hospital or clinic setting. A special MedGuide will be given to you before each treatment. Be sure to read this information carefully each time. Talk to your care team about the use of this medication in children. While it may be prescribed for children as young as 6 months for selected conditions, precautions do apply. Overdosage: If you think you have taken too much of this medicine contact a poison control center or emergency room at once. NOTE: This medicine is only for you. Do not share this medicine with others. What if I miss a dose? Keep appointments for follow-up doses. It is important not to miss your dose. Call your care team if you are unable to keep an appointment. What may interact with this medication? Interactions have not been studied. This list may not describe all possible interactions. Give your health care provider a list of all the medicines, herbs, non-prescription drugs, or dietary supplements you use. Also tell them if you smoke, drink alcohol, or use illegal drugs. Some items may interact with your medicine. What should I watch for while using this medication? Your condition will be monitored carefully while you are receiving this medication. You may need blood work while taking this medication. This medication may cause serious skin reactions. They can happen weeks to months after starting the medication. Contact your care team right away if you  notice fevers or flu-like symptoms with a rash. The rash may be red or purple and then turn into blisters or peeling of the skin. You may also notice a red rash with swelling of the face, lips, or lymph nodes in your neck or under your arms. Tell your care team right away if you have any change in  your eyesight. Talk to your care team if you may be pregnant. Serious birth defects can occur if you take this medication during pregnancy and for 4 months after the last dose. You will need a negative pregnancy test before starting this medication. Contraception is recommended while taking this medication and for 4 months after the last dose. Your care team can help you find the option that works for you. Do not breastfeed while taking this medication and for 4 months after the last dose. What side effects may I notice from receiving this medication? Side effects that you should report to your care team as soon as possible: Allergic reactions--skin rash, itching, hives, swelling of the face, lips, tongue, or throat Dry cough, shortness of breath or trouble breathing Eye pain, redness, irritation, or discharge with blurry or decreased vision Heart muscle inflammation--unusual weakness or fatigue, shortness of breath, chest pain, fast or irregular heartbeat, dizziness, swelling of the ankles, feet, or hands Hormone gland problems--headache, sensitivity to light, unusual weakness or fatigue, dizziness, fast or irregular heartbeat, increased sensitivity to cold or heat, excessive sweating, constipation, hair loss, increased thirst or amount of urine, tremors or shaking, irritability Infusion reactions--chest pain, shortness of breath or trouble breathing, feeling faint or lightheaded Kidney injury (glomerulonephritis)--decrease in the amount of urine, red or dark Parthenia Tellefsen urine, foamy or bubbly urine, swelling of the ankles, hands, or feet Liver injury--right upper belly pain, loss of appetite, nausea, light-colored stool, dark yellow or Alexandre Faries urine, yellowing skin or eyes, unusual weakness or fatigue Pain, tingling, or numbness in the hands or feet, muscle weakness, change in vision, confusion or trouble speaking, loss of balance or coordination, trouble walking, seizures Rash, fever, and swollen lymph  nodes Redness, blistering, peeling, or loosening of the skin, including inside the mouth Sudden or severe stomach pain, bloody diarrhea, fever, nausea, vomiting Side effects that usually do not require medical attention (report to your care team if they continue or are bothersome): Bone, joint, or muscle pain Diarrhea Fatigue Loss of appetite Nausea Skin rash This list may not describe all possible side effects. Call your doctor for medical advice about side effects. You may report side effects to FDA at 1-800-FDA-1088. Where should I keep my medication? This medication is given in a hospital or clinic. It will not be stored at home. NOTE: This sheet is a summary. It may not cover all possible information. If you have questions about this medicine, talk to your doctor, pharmacist, or health care provider.  2024 Elsevier/Gold Standard (2021-09-10 00:00:00)       To help prevent nausea and vomiting after your treatment, we encourage you to take your nausea medication as directed.  BELOW ARE SYMPTOMS THAT SHOULD BE REPORTED IMMEDIATELY: *FEVER GREATER THAN 100.4 F (38 C) OR HIGHER *CHILLS OR SWEATING *NAUSEA AND VOMITING THAT IS NOT CONTROLLED WITH YOUR NAUSEA MEDICATION *UNUSUAL SHORTNESS OF BREATH *UNUSUAL BRUISING OR BLEEDING *URINARY PROBLEMS (pain or burning when urinating, or frequent urination) *BOWEL PROBLEMS (unusual diarrhea, constipation, pain near the anus) TENDERNESS IN MOUTH AND THROAT WITH OR WITHOUT PRESENCE OF ULCERS (sore throat, sores in mouth, or a toothache) UNUSUAL RASH,  SWELLING OR PAIN  UNUSUAL VAGINAL DISCHARGE OR ITCHING   Items with * indicate a potential emergency and should be followed up as soon as possible or go to the Emergency Department if any problems should occur.  Please show the CHEMOTHERAPY ALERT CARD or IMMUNOTHERAPY ALERT CARD at check-in to the Emergency Department and triage nurse.  Should you have questions after your visit or need to  cancel or reschedule your appointment, please contact [facility] [place] - A DEPT OF [facility] [phone]  and follow the prompts.  Office hours are 8:00 a.m. to 4:30 p.m. Monday - Friday. Please note that voicemails left after 4:00 p.m. may not be returned until the following business day.  We are closed weekends and major holidays. You have access to a nurse at all times for urgent questions. Please call the main number to the clinic [phone] and follow the prompts.  For any non-urgent questions, you may also contact your provider using MyChart. We now offer e-Visits for anyone 5 and older to request care online for non-urgent symptoms. For details visit [email or web address].   Also download the MyChart app! Go to the app store, search "MyChart", open the app, select [place], and log in with your MyChart username and password.

## 2023-10-01 LAB — T4: T4, Total: 8.2 ug/dL (ref 4.5–12.0)

## 2023-10-15 ENCOUNTER — Other Ambulatory Visit: Payer: Self-pay | Admitting: Hematology

## 2023-10-15 ENCOUNTER — Ambulatory Visit (HOSPITAL_COMMUNITY)
Admission: RE | Admit: 2023-10-15 | Discharge: 2023-10-15 | Disposition: A | Discharge status: Home / Self Care | Source: Ambulatory Visit | Attending: Hematology | Admitting: Hematology

## 2023-10-15 DIAGNOSIS — C4371 Malignant melanoma of right lower limb, including hip: Secondary | ICD-10-CM | POA: Diagnosis not present

## 2023-10-15 MED ORDER — FLUDEOXYGLUCOSE F - 18 (FDG) INJECTION
8.3600 | Freq: Once | INTRAVENOUS | Status: AC | PRN
  Administered 2023-10-15: 8.36 via INTRAVENOUS

## 2023-10-20 NOTE — Progress Notes (Signed)
 [facility] [address], [place] [address]    Clinic Day:  10/21/2023  Referring physician: Veda Gerald, MD  Patient Care Team: Veda Gerald, MD as PCP - General (Internal Medicine) Cody Das, MD as PCP - Cardiology (Cardiology) Paulett Boros, MD as Medical Oncologist (Medical Oncology) Gerhard Knuckles, RN as Oncology Nurse Navigator (Oncology)   ASSESSMENT & PLAN:   Assessment: 1. Malignant melanoma of right posterior leg (pT4 pN3 M1): - She noticed fleshy lesion 4 months ago on the right leg. - Her PMD did a biopsy which was consistent with melanoma. - Wide local excision and lymph node biopsy by Dr. Cherlynn Cornfield on 02/12/2021. - Pathology margins free, nodular type, Breslow's thickness 9.52 mm, Clark level V, deep margins free, ulceration absent, satellitosis absent, mitotic index 2/millimeters squared, LVI negative.  Neurotropism absent.  Tumor infiltrating lymphocytes absent.  Tumor regression not noted.  3 out of 3 positive sentinel lymph nodes for melanoma. - PET CT scan on 03/22/2021 with 3 foci of intense radiotracer activity within the right lower extremity.  In the medial right upper thigh nodule just superficial to thigh musculature measuring 11 mm with SUV 7.6.  Intramedullary hypermetabolic activity within the shaft of the mid right femur with SUV 14.6.  Intense activity associated with condylar notch of the distal right femur, appears to localize to soft tissue rather than the bone.  More diffuse activity associated with the medial right calf related to excision biopsy.  There is a focus of activity adjacent to the lateral margin of the right iliac wing SUV 4.1.  No evidence of visceral metastatic disease in the chest, abdomen or pelvis. - MRI of the right hip and right knee from 04/16/2021 with solitary bone metastasis in the distal right femoral diaphysis and multiple nodal metastatic disease anteromedially in the proximal right mid  thigh. - NGS testing-BRAF negative, positive for NRAS pathogenic variant exon 3, TERT promoter.  MSI-stable.  MMR-proficient.  NTRK 1/2/3 fusion not detected.  KIT mutation negative.  PD-L1 negative. - Nivolumab -Relatlimab  every 28 days started on 05/09/2021.  Held permanently after first dose due to myocarditis. - PET scan on 07/25/2021: Slight progression with no new sites of metastatic disease. - Pembrolizumab  started on 09/03/2021.  XRT to the soft tissue nodule within the medial right thigh and midshaft right femoral lesion from 03/26/2022 through 04/09/2022. -We have discussed further options including adding a low-dose ipilimumab  to pembrolizumab  which she is already receiving without any side effects.  I have quoted response rates around 30% (J Clin Oncol. 2021 Aug 20;39(24):2647-2655. doi: 10.1200/JCO.21.00079).  We also discussed the possibility increased chance of immunotherapy related side effects with combination of PD-1 and anti-CTL A4 antibody. - We also discussed option of binimetinib as she has NRAS mutation on previous NGS testing.  However this option is also associated with significant side effects and overall response rate of around 20%. - We also discussed other options including adding lenvatinib to pembrolizumab , least favorable option due to lack of robust data.  We have discussed chemotherapy options with dacarbazine/temozolomide among others. - Pembrolizumab  with low-dose ipilimumab  started on 01/14/2023 - Guardant360 (12/2022): NRAS Q61R (?  Binimetinib), T p53 C17 6Y, TERT promoter SNV, TMB 20.8, MSI high not detected. - PET scan (05/21/2022): Postsurgical changes in the gallbladder bed as well as 2 soft tissue nodules in the right upper quadrant of the abdomen.  Stable distal right femur and soft tissue nodule within the medial right  thigh.  New focal area of increased uptake in the anterior inferior aspect of L3 vertebral body with no CT correlate.  Multiple right-sided pelvic  lymph nodes, new from August 2024 PET scan but stable from November 2020 for CT scan. - She had only received 4 total treatments with pembrolizumab  and low-dose ipilimumab  since September.  PET scan showed mild progression.  Considering alternative treatments available (temozolomide, carboplatin plus paclitaxel), we have decided to continue pembrolizumab  and low-dose ipilimumab  for another 3 cycles and rescan her.  She and her son are agreeable. - Guardant360 (06/09/2023): NRAS Q61R (binimetinib), PTEN deletion, exon 3-9 (Capivasertib), PTEN deletion exon 1-6 (Capivasertib), T p53, TERT promoter SNV -PET scan (07/23/2023): Mixed response with enlarging hypermetabolic right inguinal lymph node and small new L5 metastasis.  Stable/decreased hypermetabolic involving right upper quadrant peritoneal nodules, right iliac adenopathy, L5 and right femoral metastasis and treated soft tissue lesion in the medial right thigh. - We had a prolonged discussion about further treatment options.  We talked about further options including Abraxane (days 1, 8, 15 every 28 days) and binimetinib.  Due to the adverse side effect profile particularly cardiac, not inclined towards binimetinib.  She is not keen on chemotherapy at this time.  Will continue immunotherapy.  Will seek opinion from radiation oncology to see if they can radiate the right groin lymph node as she has oligo progression.   2. Social/family history: - She is seen with her son today.  She lives by herself at home.  She is independent of ADLs and IADLs.  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  She is non-smoker. - Father had colon cancer.  Sister had breast cancer and colon cancer.  Maternal aunt had breast cancer.  Paternal uncle had colon cancer.    Plan: 1. Malignant melanoma of the right posterior leg (pT4 pN3 M1), BRAF V600 negative: - She completed XRT to the right inguinal lymph node, 5 fractions on  08/31/2023. - Diarrhea has resolved. - Reviewed labs from today: Normal LFTs and creatinine.  CBC grossly normal with mild leukopenia and thrombocytopenia. - We reviewed PET scan from 10/15/2023: Minimal interval change with mixed response to therapy with several lesions increased in activity (posterior left chest wall).  Several lesions decreased in activity (right external iliac lymph nodes).  Several lesions are stable. - We will continue immunotherapy at this time as she is reluctant to change treatment to chemotherapy.  RTC 6 weeks for follow-up.  2.  History of immunotherapy myocarditis: - She does not have any chest pains.  Troponin is normal today.  3.  Right medial thigh pain: - She has on and off sharp pain which is stable.  4.  Hypothyroidism: - She is taking Synthroid  100 mcg daily.  TSH is 7.8 today.  It was 5.3 previously.  T4 was 8.2.  Will closely monitor.   5.  Normocytic anemia: - She is off of iron tablets.  Hemoglobin today is 11.4.  Previously she was taking 3 times a week.    Orders Placed This Encounter  Procedures   Magnesium     Standing Status:   Future    Expected Date:   11/11/2023    Expiration Date:   11/10/2024   CBC with Differential    Standing Status:   Future    Expected Date:   11/11/2023    Expiration Date:   11/11/2024   Comprehensive metabolic panel    Standing Status:  Future    Expected Date:   11/11/2023    Expiration Date:   11/11/2024   T4    Standing Status:   Future    Expected Date:   11/11/2023    Expiration Date:   11/11/2024   TSH    Standing Status:   Future    Expected Date:   11/11/2023    Expiration Date:   11/11/2024   Magnesium     Standing Status:   Future    Expected Date:   12/02/2023    Expiration Date:   12/01/2024   CBC with Differential    Standing Status:   Future    Expected Date:   12/02/2023    Expiration Date:   12/02/2024   Comprehensive metabolic panel    Standing Status:   Future    Expected Date:   12/02/2023    Expiration  Date:   12/02/2024   T4    Standing Status:   Future    Expected Date:   12/02/2023    Expiration Date:   12/02/2024   TSH    Standing Status:   Future    Expected Date:   12/02/2023    Expiration Date:   12/02/2024    Hurman Maiden R Teague,acting as a scribe for Paulett Boros, MD.,have documented all relevant documentation on the behalf of Paulett Boros, MD,as directed by  Paulett Boros, MD while in the presence of Paulett Boros, MD.  I, Paulett Boros MD, have reviewed the above documentation for accuracy and completeness, and I agree with the above.   Paulett Boros, MD   6/11/20253:39 PM  CHIEF COMPLAINT:   Diagnosis: metastatic malignant melanoma, BRAF V600 negative    Cancer Staging  Malignant melanoma of lower leg, right (HCC) Staging form: Melanoma of the Skin, AJCC 8th Edition - Clinical stage from 03/27/2021: Stage IV (cT4a, cN3, cM1) - Unsigned    Prior Therapy: none  Current Therapy:  Pembrolizumab  (200) q21d Keytruda     HISTORY OF PRESENT ILLNESS:   Oncology History  Malignant melanoma of lower leg, right (HCC)  03/27/2021 Initial Diagnosis   Malignant melanoma of lower leg, right (HCC)   05/09/2021 - 05/09/2021 Chemotherapy   Patient is on Treatment Plan : MELANOMA - Opdualag  (nivolumab /relatlimab ) q28d     09/03/2021 - 01/07/2022 Chemotherapy   Patient is on Treatment Plan : MELANOMA Pembrolizumab  (200) q21d     09/03/2021 -  Chemotherapy   Patient is on Treatment Plan : MELANOMA Pembrolizumab  (200) + Yervoy  (1mg /kg) q21d        INTERVAL HISTORY:   Elizabeth Norman is a 83 y.o. female seen for follow-up of metastatic malignant melanoma. She was last seen by me on 09/09/23.  Since her last visit, she underwent restaging PET on 10/15/23.   Today, she states that she is doing well overall. Her appetite level is at 100%. Her energy level is at 100%.   PAST MEDICAL HISTORY:   Past Medical History: Past Medical History:  Diagnosis Date    Arthritis    Hypertension    Malignant melanoma of lower leg, right (HCC) 03/27/2021   Port-A-Cath in place 04/30/2021    Surgical History: Past Surgical History:  Procedure Laterality Date   BIOPSY  04/02/2023   Procedure: BIOPSY;  Surgeon: Asencion Blacksmith, MD;  Location: [facility] ENDOSCOPY;  Service: Gastroenterology;;   CATARACT EXTRACTION W/PHACO Right 10/03/2014   Procedure: CATARACT EXTRACTION PHACO AND INTRAOCULAR LENS PLACEMENT (IOC);  Surgeon: Albert Huff, MD;  Location: [facility] ORS;  Service: Ophthalmology;  Laterality: Right;  CDE:10.09   CATARACT EXTRACTION W/PHACO Left 10/10/2014   Procedure: CATARACT EXTRACTION PHACO AND INTRAOCULAR LENS PLACEMENT (IOC);  Surgeon: Albert Huff, MD;  Location: [facility] ORS;  Service: Ophthalmology;  Laterality: Left;  CDE:8.69   CORNEAL 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   CORNEAL TRANSPLANT Left 2020   DENTAL RESTORATION/EXTRACTION WITH X-RAY     ERCP N/A 04/02/2023   Procedure: ENDOSCOPIC RETROGRADE CHOLANGIOPANCREATOGRAPHY (ERCP);  Surgeon: Asencion Blacksmith, MD;  Location: [facility] ENDOSCOPY;  Service: Gastroenterology;  Laterality: N/A;   IR IMAGING GUIDED PORT INSERTION  04/05/2021   LEFT HEART CATH AND CORONARY ANGIOGRAPHY N/A 05/28/2021   Procedure: LEFT HEART CATH AND CORONARY ANGIOGRAPHY;  Surgeon: Cody Das, MD;  Location: [facility] INVASIVE CV LAB;  Service: Cardiovascular;  Laterality: N/A;   MELANOMA EXCISION Right 02/12/2021   Procedure: WIDE LOCAL EXCISION RIGHT LOWER LEG MELANOMA , ADVANCEMENT FLAP CLOSURE DEFECT;  Surgeon: Lockie Rima, MD;  Location: [facility] OR;  Service: General;  Laterality: Right;   REMOVAL OF STONES  04/02/2023   Procedure: REMOVAL OF STONES;  Surgeon: Asencion Blacksmith, MD;  Location: [facility] ENDOSCOPY;  Service: Gastroenterology;;   Daryle Eon  04/02/2023   Procedure: Daryle Eon;  Surgeon: Asencion Blacksmith, MD;  Location: [facility] ENDOSCOPY;  Service: Gastroenterology;;   SENTINEL NODE BIOPSY Right 02/12/2021   Procedure: SENTINEL NODE  BIOPSY;  Surgeon: Lockie Rima, MD;  Location: [facility] OR;  Service: General;  Laterality: Right;   SPHINCTEROTOMY  04/02/2023   Procedure: SPHINCTEROTOMY;  Surgeon: Asencion Blacksmith, MD;  Location: [facility] ENDOSCOPY;  Service: Gastroenterology;;    Social History: Social History   Socioeconomic History   Marital status: Widowed    Spouse name: Not on file   Number of children: 3   Years of education: Not on file   Highest education level: Not on file  Occupational History   Not on file  Tobacco Use   Smoking status: Never   Smokeless tobacco: Never  Vaping Use   Vaping status: Never Used  Substance and Sexual Activity   Alcohol use: No   Drug use: No   Sexual activity: Not on file  Other Topics Concern   Not on file  Social History Narrative   Not on file   Social Drivers of Health   Financial Resource Strain: Not on file  Food Insecurity: No Food Insecurity (08/04/2023)   [facility]   Hunger Vital Sign    Worried About Running Out of Food in the Last Year: Never true    Ran Out of Food in the Last Year: Never true  Transportation Needs: No Transportation Needs (08/04/2023)   Received from [facility] - Transportation    Lack of Transportation (Medical): No    Lack of Transportation (Non-Medical): No  Physical Activity: Not on file  Stress: Not on file  Social Connections: Not on file  Intimate Partner Violence: Not At Risk (04/29/2023)   Humiliation, Afraid, Rape, and Kick questionnaire    Fear of Current or Ex-Partner: No    Emotionally Abused: No    Physically Abused: No    Sexually Abused: No    Family History: Family History  Problem Relation Age of Onset   Cancer Father    Hypertension Sister    Hypertension Sister     Current Medications:  Current Outpatient Medications:    ALPRAZolam  (XANAX ) 0.5 MG tablet, Take 0.5 mg by mouth 3 (three) times daily., Disp: , Rfl:  2   calcium  carbonate (OSCAL) 1500 (600 Ca) MG TABS  tablet, Take 600 mg of elemental calcium  by mouth 3 (three) times a week., Disp: , Rfl:    cholecalciferol (VITAMIN D) 1000 UNITS tablet, Take 1,000 Units by mouth 3 (three) times a week., Disp: , Rfl:    Cyanocobalamin (VITAMIN B-12 PO), Take 1 tablet by mouth once a week., Disp: , Rfl:    diphenhydramine -acetaminophen  (TYLENOL  PM) 25-500 MG TABS tablet, Take 1 tablet by mouth at bedtime as needed., Disp: , Rfl:    levothyroxine  (SYNTHROID ) 100 MCG tablet, take 1 tablet (100 MICROGRAM total) by mouth daily before breakfast., Disp: 30 tablet, Rfl: 0   metoprolol  tartrate (LOPRESSOR ) 25 MG tablet, Take 1 tablet (25 mg total) by mouth 2 (two) times daily. Hold until follow-up with your doctor/cardiologist, Disp: , Rfl:    ondansetron  (ZOFRAN -ODT) 4 MG disintegrating tablet, Take 1 tablet (4 mg total) by mouth every 6 (six) hours as needed for nausea., Disp: 20 tablet, Rfl: 0   prednisoLONE  acetate (PRED FORTE ) 1 % ophthalmic suspension, Place 1 drop into both eyes daily., Disp: , Rfl:  No current facility-administered medications for this visit.  Facility-Administered Medications Ordered in Other Visits:    sodium chloride  flush (NS) 0.9 % injection 10 mL, 10 mL, Intracatheter, PRN, Cristabel Bicknell, MD, 10 mL at 10/21/23 1351   Allergies: Allergies  Allergen Reactions   Tape     Pulls skin, please use paper tape    REVIEW OF SYSTEMS:   Review of Systems  Constitutional:  Negative for chills, fatigue and fever.  HENT:   Negative for lump/mass, mouth sores, nosebleeds, sore throat and trouble swallowing.   Eyes:  Negative for eye problems.  Respiratory:  Negative for cough and shortness of breath.   Cardiovascular:  Negative for chest pain, leg swelling and palpitations.  Gastrointestinal:  Negative for abdominal pain, constipation, diarrhea, nausea and vomiting.  Genitourinary:  Negative for bladder incontinence, difficulty urinating, dysuria, frequency, hematuria and nocturia.    Musculoskeletal:  Negative for arthralgias, back pain, flank pain, myalgias and neck pain.  Skin:  Negative for itching and rash.  Neurological:  Negative for dizziness, headaches and numbness.  Hematological:  Does not bruise/bleed easily.  Psychiatric/Behavioral:  Positive for sleep disturbance. Negative for depression and suicidal ideas. The patient is not nervous/anxious.   All other systems reviewed and are negative.    VITALS:   There were no vitals taken for this visit.  Wt Readings from Last 3 Encounters:  10/21/23 160 lb 9.6 oz (72.8 kg)  09/30/23 154 lb 5.2 oz (70 kg)  09/09/23 157 lb 10.1 oz (71.5 kg)    There is no height or weight on file to calculate BMI.  Performance status (ECOG): 1 - Symptomatic but completely ambulatory  PHYSICAL EXAM:   Physical Exam Vitals and nursing note reviewed. Exam conducted with a chaperone present.  Constitutional:      Appearance: Normal appearance.  Cardiovascular:     Rate and Rhythm: Normal rate and regular rhythm.     Pulses: Normal pulses.     Heart sounds: Normal heart sounds.  Pulmonary:     Effort: Pulmonary effort is normal.     Breath sounds: Normal breath sounds.  Abdominal:     Palpations: Abdomen is soft. There is no hepatomegaly, splenomegaly or mass.     Tenderness: There is no abdominal tenderness.  Musculoskeletal:     Right lower leg: No edema.  Left lower leg: No edema.  Lymphadenopathy:     Cervical: No cervical adenopathy.     Right cervical: No superficial, deep or posterior cervical adenopathy.    Left cervical: No superficial, deep or posterior cervical adenopathy.     Upper Body:     Right upper body: No supraclavicular or axillary adenopathy.     Left upper body: No supraclavicular or axillary adenopathy.  Neurological:     General: No focal deficit present.     Mental Status: She is alert and oriented to person, place, and time.  Psychiatric:        Mood and Affect: Mood normal.         Behavior: Behavior normal.     LABS:   CBC     Component Value Date/Time   WBC 3.1 (L) 10/21/2023 0950   RBC 4.06 10/21/2023 0950   HGB 11.4 (L) 10/21/2023 0950   HCT 35.7 (L) 10/21/2023 0950   PLT 117 (L) 10/21/2023 0950   MCV 87.9 10/21/2023 0950   MCH 28.1 10/21/2023 0950   MCHC 31.9 10/21/2023 0950   RDW 15.9 (H) 10/21/2023 0950   LYMPHSABS 0.8 10/21/2023 0950   MONOABS 0.3 10/21/2023 0950   EOSABS 0.1 10/21/2023 0950   BASOSABS 0.0 10/21/2023 0950    CMP      Component Value Date/Time   NA 137 10/21/2023 0950   K 4.0 10/21/2023 0950   CL 102 10/21/2023 0950   CO2 26 10/21/2023 0950   GLUCOSE 101 (H) 10/21/2023 0950   BUN 19 10/21/2023 0950   CREATININE 0.90 10/21/2023 0950   CALCIUM  8.8 (L) 10/21/2023 0950   PROT 6.5 10/21/2023 0950   ALBUMIN 3.4 (L) 10/21/2023 0950   AST 21 10/21/2023 0950   ALT 13 10/21/2023 0950   ALKPHOS 111 10/21/2023 0950   BILITOT 0.6 10/21/2023 0950   GFRNONAA >60 10/21/2023 0950   GFRAA >60 09/29/2014 1237     No results found for: CEA1, CEA / No results found for: CEA1, CEA No results found for: PSA1 No results found for: XBJ478 No results found for: CAN125  No results found for: TOTALPROTELP, ALBUMINELP, A1GS, A2GS, BETS, BETA2SER, GAMS, MSPIKE, SPEI Lab Results  Component Value Date   TIBC 312 06/17/2023   FERRITIN 20 06/17/2023   IRONPCTSAT 10 (L) 06/17/2023   Lab Results  Component Value Date   LDH 348 (H) 03/30/2023   LDH 137 06/05/2022   LDH 144 05/13/2022     STUDIES:   NM PET Image Restage (PS) Whole Body Result Date: 10/20/2023 CLINICAL DATA:  Subsequent treatment strategy for malignant melanoma of the lower RIGHT leg. EXAM: NUCLEAR MEDICINE PET WHOLE BODY TECHNIQUE: 8.36 mCi F-18 FDG was injected intravenously. Full-ring PET imaging was performed from the head to foot after the radiotracer. CT data was obtained and used for attenuation correction and anatomic localization.  Fasting blood glucose: 110 mg/dl COMPARISON:  FDG PET scan 09/22/2023 FINDINGS: Mediastinal blood pool activity: SUV max HEAD/NECK: No hypermetabolic activity in the scalp. No hypermetabolic cervical lymph nodes. Incidental CT findings: none CHEST: No hypermetabolic pulmonary nodules. No hypermetabolic mediastinal nodes. Focus of metabolic activity in the intracostal space posterior LEFT hemithorax with SUV max equal 6.0 image 127. Favor soft tissue metastasis. Incidental CT findings: none ABDOMEN/PELVIS: Intense metabolic activity in the region of the gallbladder fossa SUV max equal 6 3 on image 164. similar comparison exam. Intense radiotracer activity in RIGHT inguinal nodes with SUV max equal 14.1 compared SUV max  equal 11.3 on image 53. Interval decrease in size and metabolic activity of of LEFT or RIGHT external iliac node image 239. c Small retro peritoneal implant LEFT of the bladder the RIGHT kidney is unchanged on image 69. Incidental CT findings: Stable presacral fat density mass. SKELETON: Intense radiotracer activity within the L3 vertebral body with SUV max equal 7.1 compared SUV max equal 0.8. Small lesion at L5 is less intense. Incidental CT findings: none EXTREMITIES: Soft tissue lesion in the medial RIGHT thigh is similar with SUV max equal 5.9 on image 302. Incidental CT findings: none IMPRESSION: 1. Overall minimal interval change with mixed response to therapy. 2. Several lesions are increased activity (posterior LEFT chest wall and RIGHT femur). 3. Several lesions are decreased activity(for example RIGHT external iliac nodes). 4. Several lesions are stable (gallbladder fossa lesion, lumbar spine lesion and RIGHT retroperitoneal lesion). Electronically Signed   By: Deboraha Fallow M.D.   On: 10/20/2023 11:29

## 2023-10-21 ENCOUNTER — Inpatient Hospital Stay: Attending: Hematology

## 2023-10-21 ENCOUNTER — Inpatient Hospital Stay

## 2023-10-21 ENCOUNTER — Encounter: Payer: Self-pay | Admitting: Hematology

## 2023-10-21 ENCOUNTER — Inpatient Hospital Stay: Admitting: Hematology

## 2023-10-21 VITALS — BP 115/54 | HR 72 | Temp 97.2°F | Resp 18

## 2023-10-21 DIAGNOSIS — Z8679 Personal history of other diseases of the circulatory system: Secondary | ICD-10-CM | POA: Insufficient documentation

## 2023-10-21 DIAGNOSIS — Z79620 Long term (current) use of immunosuppressive biologic: Secondary | ICD-10-CM | POA: Diagnosis not present

## 2023-10-21 DIAGNOSIS — Z95828 Presence of other vascular implants and grafts: Secondary | ICD-10-CM

## 2023-10-21 DIAGNOSIS — D649 Anemia, unspecified: Secondary | ICD-10-CM | POA: Diagnosis not present

## 2023-10-21 DIAGNOSIS — M79651 Pain in right thigh: Secondary | ICD-10-CM | POA: Insufficient documentation

## 2023-10-21 DIAGNOSIS — E039 Hypothyroidism, unspecified: Secondary | ICD-10-CM | POA: Insufficient documentation

## 2023-10-21 DIAGNOSIS — Z5112 Encounter for antineoplastic immunotherapy: Secondary | ICD-10-CM | POA: Diagnosis not present

## 2023-10-21 DIAGNOSIS — C7951 Secondary malignant neoplasm of bone: Secondary | ICD-10-CM | POA: Diagnosis not present

## 2023-10-21 DIAGNOSIS — C4371 Malignant melanoma of right lower limb, including hip: Secondary | ICD-10-CM

## 2023-10-21 DIAGNOSIS — D696 Thrombocytopenia, unspecified: Secondary | ICD-10-CM | POA: Diagnosis not present

## 2023-10-21 DIAGNOSIS — D72819 Decreased white blood cell count, unspecified: Secondary | ICD-10-CM | POA: Diagnosis not present

## 2023-10-21 LAB — COMPREHENSIVE METABOLIC PANEL WITH GFR
ALT: 13 U/L (ref 0–44)
AST: 21 U/L (ref 15–41)
Albumin: 3.4 g/dL — ABNORMAL LOW (ref 3.5–5.0)
Alkaline Phosphatase: 111 U/L (ref 38–126)
Anion gap: 9 (ref 5–15)
BUN: 19 mg/dL (ref 8–23)
CO2: 26 mmol/L (ref 22–32)
Calcium: 8.8 mg/dL — ABNORMAL LOW (ref 8.9–10.3)
Chloride: 102 mmol/L (ref 98–111)
Creatinine, Ser: 0.9 mg/dL (ref 0.44–1.00)
GFR, Estimated: >60 mL/min (ref >60)
Glucose, Bld: 101 mg/dL — ABNORMAL HIGH (ref 70–99)
Potassium: 4 mmol/L (ref 3.5–5.1)
Sodium: 137 mmol/L (ref 135–145)
Total Bilirubin: 0.6 mg/dL (ref 0.0–1.2)
Total Protein: 6.5 g/dL (ref 6.5–8.1)

## 2023-10-21 LAB — CBC WITH DIFFERENTIAL/PLATELET
Abs Immature Granulocytes: 0.01 10*3/uL (ref 0.00–0.07)
Basophils Absolute: 0 10*3/uL (ref 0.0–0.1)
Basophils Relative: 1 %
Eosinophils Absolute: 0.1 10*3/uL (ref 0.0–0.5)
Eosinophils Relative: 3 %
HCT: 35.7 % — ABNORMAL LOW (ref 36.0–46.0)
Hemoglobin: 11.4 g/dL — ABNORMAL LOW (ref 12.0–15.0)
Immature Granulocytes: 0 %
Lymphocytes Relative: 27 %
Lymphs Abs: 0.8 10*3/uL (ref 0.7–4.0)
MCH: 28.1 pg (ref 26.0–34.0)
MCHC: 31.9 g/dL (ref 30.0–36.0)
MCV: 87.9 fL (ref 80.0–100.0)
Monocytes Absolute: 0.3 10*3/uL (ref 0.1–1.0)
Monocytes Relative: 11 %
Neutro Abs: 1.8 10*3/uL (ref 1.7–7.7)
Neutrophils Relative %: 58 %
Platelets: 117 10*3/uL — ABNORMAL LOW (ref 150–400)
RBC: 4.06 MIL/uL (ref 3.87–5.11)
RDW: 15.9 % — ABNORMAL HIGH (ref 11.5–15.5)
WBC: 3.1 10*3/uL — ABNORMAL LOW (ref 4.0–10.5)
nRBC: 0 % (ref 0.0–0.2)

## 2023-10-21 LAB — MAGNESIUM: Magnesium: 1.9 mg/dL (ref 1.7–2.4)

## 2023-10-21 LAB — TSH: TSH: 7.897 u[IU]/mL — ABNORMAL HIGH (ref 0.350–4.500)

## 2023-10-21 LAB — TROPONIN I (HIGH SENSITIVITY): Troponin I (High Sensitivity): 8 ng/L (ref <18)

## 2023-10-21 MED ORDER — FAMOTIDINE IN NACL 20-0.9 MG/50ML-% IV SOLN
20.0000 mg | Freq: Once | INTRAVENOUS | Status: AC
  Administered 2023-10-21: 20 mg via INTRAVENOUS
  Filled 2023-10-21: qty 50

## 2023-10-21 MED ORDER — HEPARIN SOD (PORK) LOCK FLUSH 100 UNIT/ML IV SOLN
500.0000 [IU] | Freq: Once | INTRAVENOUS | Status: AC | PRN
  Administered 2023-10-21: 500 [IU]

## 2023-10-21 MED ORDER — CETIRIZINE HCL 10 MG/ML IV SOLN
5.0000 mg | Freq: Once | INTRAVENOUS | Status: AC
  Administered 2023-10-21: 5 mg via INTRAVENOUS
  Filled 2023-10-21: qty 1

## 2023-10-21 MED ORDER — SODIUM CHLORIDE FLUSH 0.9 % IV SOLN
10.0000 mL | Freq: Once | INTRAVENOUS | Status: AC
  Administered 2023-10-21: 10 mL via INTRAVENOUS
  Filled 2023-10-21: qty 10

## 2023-10-21 MED ORDER — SODIUM CHLORIDE 0.9 % IV SOLN
200.0000 mg | Freq: Once | INTRAVENOUS | Status: AC
  Administered 2023-10-21: 200 mg via INTRAVENOUS
  Filled 2023-10-21: qty 8

## 2023-10-21 MED ORDER — SODIUM CHLORIDE 0.9 % IV SOLN: Freq: Once | INTRAVENOUS | Status: AC

## 2023-10-21 MED ORDER — SODIUM CHLORIDE 0.9 % IV SOLN
1.0000 mg/kg | Freq: Once | INTRAVENOUS | Status: AC
  Administered 2023-10-21: 70 mg via INTRAVENOUS
  Filled 2023-10-21: qty 14

## 2023-10-21 MED ORDER — SODIUM CHLORIDE 0.9% FLUSH
10.0000 mL | INTRAVENOUS | Status: DC | PRN
  Administered 2023-10-21: 10 mL

## 2023-10-21 NOTE — Progress Notes (Signed)
 Patient has been examined by Dr. Ellin Saba. Vital signs and labs have been reviewed by MD - ANC, Creatinine, LFTs, hemoglobin, and platelets are within treatment parameters per M.D. - pt may proceed with treatment.  Primary RN and pharmacy notified.

## 2023-10-21 NOTE — Patient Instructions (Signed)
 [facility] [place] - A DEPT OF [place]  Discharge Instructions: Thank you for choosing [facility] to provide your oncology and hematology care.  If you have a lab appointment with the Cancer Center - please note that after April 8th, 2024, all labs will be drawn in the cancer center.  You do not have to check in or register with the main entrance as you have in the past but will complete your check-in in the cancer center.  Wear comfortable clothing and clothing appropriate for easy access to any Portacath or PICC line.   We strive to give you quality time with your provider. You may need to reschedule your appointment if you arrive late (15 or more minutes).  Arriving late affects you and other patients whose appointments are after yours.  Also, if you miss three or more appointments without notifying the office, you may be dismissed from the clinic at the provider's discretion.      For prescription refill requests, have your pharmacy contact our office and allow 72 hours for refills to be completed.    Today you received the following chemotherapy and/or immunotherapy agents Keytruda /Yervoy    To help prevent nausea and vomiting after your treatment, we encourage you to take your nausea medication as directed.  Pembrolizumab  Injection What is this medication? PEMBROLIZUMAB  (PEM broe LIZ ue mab) treats some types of cancer. It works by helping your immune system slow or stop the spread of cancer cells. It is a monoclonal antibody. This medicine may be used for other purposes; ask your health care provider or pharmacist if you have questions. COMMON BRAND NAME(S): Keytruda  What should I tell my care team before I take this medication? They need to know if you have any of these conditions: Allogeneic stem cell transplant (uses someone else's stem cells) Autoimmune diseases, such as Crohn disease, ulcerative colitis, lupus History of chest  radiation Nervous system problems, such as Guillain-Barre syndrome, myasthenia gravis Organ transplant An unusual or allergic reaction to pembrolizumab , other medications, foods, dyes, or preservatives Pregnant or trying to get pregnant Breast-feeding How should I use this medication? This medication is injected into a vein. It is given by your care team in a hospital or clinic setting. A special MedGuide will be given to you before each treatment. Be sure to read this information carefully each time. Talk to your care team about the use of this medication in children. While it may be prescribed for children as young as 6 months for selected conditions, precautions do apply. Overdosage: If you think you have taken too much of this medicine contact a poison control center or emergency room at once. NOTE: This medicine is only for you. Do not share this medicine with others. What if I miss a dose? Keep appointments for follow-up doses. It is important not to miss your dose. Call your care team if you are unable to keep an appointment. What may interact with this medication? Interactions have not been studied. This list may not describe all possible interactions. Give your health care provider a list of all the medicines, herbs, non-prescription drugs, or dietary supplements you use. Also tell them if you smoke, drink alcohol, or use illegal drugs. Some items may interact with your medicine. What should I watch for while using this medication? Your condition will be monitored carefully while you are receiving this medication. You may need blood work while taking this medication. This medication may cause  serious skin reactions. They can happen weeks to months after starting the medication. Contact your care team right away if you notice fevers or flu-like symptoms with a rash. The rash may be red or purple and then turn into blisters or peeling of the skin. You may also notice a red rash with  swelling of the face, lips, or lymph nodes in your neck or under your arms. Tell your care team right away if you have any change in your eyesight. Talk to your care team if you may be pregnant. Serious birth defects can occur if you take this medication during pregnancy and for 4 months after the last dose. You will need a negative pregnancy test before starting this medication. Contraception is recommended while taking this medication and for 4 months after the last dose. Your care team can help you find the option that works for you. Do not breastfeed while taking this medication and for 4 months after the last dose. What side effects may I notice from receiving this medication? Side effects that you should report to your care team as soon as possible: Allergic reactions--skin rash, itching, hives, swelling of the face, lips, tongue, or throat Dry cough, shortness of breath or trouble breathing Eye pain, redness, irritation, or discharge with blurry or decreased vision Heart muscle inflammation--unusual weakness or fatigue, shortness of breath, chest pain, fast or irregular heartbeat, dizziness, swelling of the ankles, feet, or hands Hormone gland problems--headache, sensitivity to light, unusual weakness or fatigue, dizziness, fast or irregular heartbeat, increased sensitivity to cold or heat, excessive sweating, constipation, hair loss, increased thirst or amount of urine, tremors or shaking, irritability Infusion reactions--chest pain, shortness of breath or trouble breathing, feeling faint or lightheaded Kidney injury (glomerulonephritis)--decrease in the amount of urine, red or dark brown urine, foamy or bubbly urine, swelling of the ankles, hands, or feet Liver injury--right upper belly pain, loss of appetite, nausea, light-colored stool, dark yellow or brown urine, yellowing skin or eyes, unusual weakness or fatigue Pain, tingling, or numbness in the hands or feet, muscle weakness, change in  vision, confusion or trouble speaking, loss of balance or coordination, trouble walking, seizures Rash, fever, and swollen lymph nodes Redness, blistering, peeling, or loosening of the skin, including inside the mouth Sudden or severe stomach pain, bloody diarrhea, fever, nausea, vomiting Side effects that usually do not require medical attention (report to your care team if they continue or are bothersome): Bone, joint, or muscle pain Diarrhea Fatigue Loss of appetite Nausea Skin rash This list may not describe all possible side effects. Call your doctor for medical advice about side effects. You may report side effects to FDA at 1-800-FDA-1088. Where should I keep my medication? This medication is given in a hospital or clinic. It will not be stored at home. NOTE: This sheet is a summary. It may not cover all possible information. If you have questions about this medicine, talk to your doctor, pharmacist, or health care provider.  2024 Elsevier/Gold Standard (2021-09-10 00:00:00)     BELOW ARE SYMPTOMS THAT SHOULD BE REPORTED IMMEDIATELY: *FEVER GREATER THAN 100.4 F (38 C) OR HIGHER *CHILLS OR SWEATING *NAUSEA AND VOMITING THAT IS NOT CONTROLLED WITH YOUR NAUSEA MEDICATION *UNUSUAL SHORTNESS OF BREATH *UNUSUAL BRUISING OR BLEEDING *URINARY PROBLEMS (pain or burning when urinating, or frequent urination) *BOWEL PROBLEMS (unusual diarrhea, constipation, pain near the anus) TENDERNESS IN MOUTH AND THROAT WITH OR WITHOUT PRESENCE OF ULCERS (sore throat, sores in mouth, or a toothache) UNUSUAL RASH, SWELLING  OR PAIN  UNUSUAL VAGINAL DISCHARGE OR ITCHING   Items with * indicate a potential emergency and should be followed up as soon as possible or go to the Emergency Department if any problems should occur.  Please show the CHEMOTHERAPY ALERT CARD or IMMUNOTHERAPY ALERT CARD at check-in to the Emergency Department and triage nurse.  Should you have questions after your visit or need  to cancel or reschedule your appointment, please contact [facility] - A DEPT OF [facility] [phone]  and follow the prompts.  Office hours are 8:00 a.m. to 4:30 p.m. Monday - Friday. Please note that voicemails left after 4:00 p.m. may not be returned until the following business day.  We are closed weekends and major holidays. You have access to a nurse at all times for urgent questions. Please call the main number to the clinic [phone] and follow the prompts.  For any non-urgent questions, you may also contact your provider using MyChart. We now offer e-Visits for anyone 51 and older to request care online for non-urgent symptoms. For details visit [email or web address].   Also download the MyChart app! Go to the app store, search MyChart, open the app, select [place], and log in with your MyChart username and password.

## 2023-10-21 NOTE — Progress Notes (Signed)
 Patient presents today for Keytruda /Yervoy  infusion. Patient is in satisfactory condition with no new complaints voiced.  Vital signs are stable.  Labs reviewed by Dr. Cheree Cords during the office visit and all labs are within treatment parameters.  We will proceed with treatment per MD orders.   Treatment given today per MD orders. Tolerated infusion without adverse affects. Vital signs stable. No complaints at this time. Discharged from clinic ambulatory in stable condition. Alert and oriented x 3. F/U with [employer] as scheduled.

## 2023-10-21 NOTE — Patient Instructions (Signed)
 [facility] at [facility] Discharge Instructions   You were seen and examined today by Dr. Cheree Cords.  He reviewed the results of your lab work which are normal/stable.   He reviewed the results of your PET scan. It is showing a mixed response - some areas are stable, some areas have improved, and some areas have gotten a little brighter.   We will proceed with your treatment today.   Return as scheduled.    Thank you for choosing [facility] at [facility] to provide your oncology and hematology care.  To afford each patient quality time with our provider, please arrive at least 15 minutes before your scheduled appointment time.   If you have a lab appointment with the Cancer Center please come in thru the Main Entrance and check in at the main information desk.  You need to re-schedule your appointment should you arrive 10 or more minutes late.  We strive to give you quality time with our providers, and arriving late affects you and other patients whose appointments are after yours.  Also, if you no show three or more times for appointments you may be dismissed from the clinic at the providers discretion.     Again, thank you for choosing [facility]  Our hope is that these requests will decrease the amount of time that you wait before being seen by our physicians.       _____________________________________________________________  Should you have questions after your visit to [facility], please contact our office at [phone] and follow the prompts.  Our office hours are 8:00 a.m. and 4:30 p.m. Monday - Friday.  Please note that voicemails left after 4:00 p.m. may not be returned until the following business day.  We are closed weekends and major holidays.  You do have access to a nurse 24-7, just call the main number to the clinic [phone] and do not press any options, hold on the line and a nurse will  answer the phone.    For prescription refill requests, have your pharmacy contact our office and allow 72 hours.    Due to Covid, you will need to wear a mask upon entering the hospital. If you do not have a mask, a mask will be given to you at the Main Entrance upon arrival. For doctor visits, patients may have 1 support person age 34 or older with them. For treatment visits, patients can not have anyone with them due to social distancing guidelines and our immunocompromised population.

## 2023-10-21 NOTE — Progress Notes (Signed)
 Ipilimumab  (YERVOY ) Patient Monitoring Assessment   Is the patient experiencing any of the following general symptoms?:  [ ] Difficulty performing normal activities [ ] Feeling sluggish or cold all the time [ ] Unusual weight gain [ ] Constant or unusual headaches [ ] Feeling dizzy or faint [ ] Changes in eyesight (blurry vision, double vision, or other vision problems) [ ] Changes in mood or behavior (ex: decreased sex drive, irritability, or forgetfulness) [ ] Starting new medications (ex: steroids, other medications that lower immune response) [ X]Patient is not experiencing any of the general symptoms above.   Gastrointestinal  Patient is having 7 bowel movements each day.  Is this different from baseline? [ ] Yes [X ]No Are your stools watery or do they have a foul smell? [ ] Yes [X ]No Have you seen blood in your stools? [ ] Yes [ X]No Are your stools dark, tarry, or sticky? [ ] Yes [ X]No Are you having pain or tenderness in your belly? [ ] Yes [X ]No  Skin Does your skin itch? [ ] Yes [ X]No Do you have a rash? [ ] Yes [ X]No Has your skin blistered and/or peeled? [ ] Yes [ X]No Do you have sores in your mouth? [ ] Yes [ X]No  Hepatic Has your urine been dark or tea colored? [ ] Yes [ X]No Have you noticed that your skin or the whites of your eyes are turning yellow? [ ] Yes [X ]No Are you bleeding or bruising more easily than normal? [ ] Yes [X ]No Are you nauseous and/or vomiting? [ ] Yes [X ]No Do you have pain on the right side of your stomach? [ ] Yes [ X]No  Neurologic  Are you having unusual weakness of legs, arms, or face? [ ] Yes [ X]No Are you having numbness or tingling in your hands or feet? [ ] Yes [ X]No  Luther Saltness

## 2023-10-22 LAB — T4: T4, Total: 8.1 ug/dL (ref 4.5–12.0)

## 2023-10-23 ENCOUNTER — Other Ambulatory Visit: Payer: Self-pay

## 2023-10-25 ENCOUNTER — Other Ambulatory Visit: Payer: Self-pay

## 2023-11-03 DIAGNOSIS — C4370 Malignant melanoma of unspecified lower limb, including hip: Secondary | ICD-10-CM | POA: Diagnosis not present

## 2023-11-03 DIAGNOSIS — C4371 Malignant melanoma of right lower limb, including hip: Secondary | ICD-10-CM | POA: Diagnosis not present

## 2023-11-03 DIAGNOSIS — D4989 Neoplasm of unspecified behavior of other specified sites: Secondary | ICD-10-CM | POA: Diagnosis not present

## 2023-11-03 DIAGNOSIS — C7989 Secondary malignant neoplasm of other specified sites: Secondary | ICD-10-CM | POA: Diagnosis not present

## 2023-11-03 DIAGNOSIS — C774 Secondary and unspecified malignant neoplasm of inguinal and lower limb lymph nodes: Secondary | ICD-10-CM | POA: Diagnosis not present

## 2023-11-11 ENCOUNTER — Encounter: Payer: Self-pay | Admitting: Hematology

## 2023-11-11 ENCOUNTER — Other Ambulatory Visit: Payer: Self-pay | Admitting: *Deleted

## 2023-11-11 ENCOUNTER — Inpatient Hospital Stay: Attending: Hematology

## 2023-11-11 ENCOUNTER — Inpatient Hospital Stay

## 2023-11-11 ENCOUNTER — Ambulatory Visit: Payer: Self-pay | Admitting: *Deleted

## 2023-11-11 VITALS — BP 124/55 | HR 73 | Temp 97.3°F | Resp 18

## 2023-11-11 DIAGNOSIS — Z79620 Long term (current) use of immunosuppressive biologic: Secondary | ICD-10-CM | POA: Diagnosis not present

## 2023-11-11 DIAGNOSIS — Z95828 Presence of other vascular implants and grafts: Secondary | ICD-10-CM

## 2023-11-11 DIAGNOSIS — C7951 Secondary malignant neoplasm of bone: Secondary | ICD-10-CM | POA: Insufficient documentation

## 2023-11-11 DIAGNOSIS — C4371 Malignant melanoma of right lower limb, including hip: Secondary | ICD-10-CM | POA: Diagnosis not present

## 2023-11-11 DIAGNOSIS — C774 Secondary and unspecified malignant neoplasm of inguinal and lower limb lymph nodes: Secondary | ICD-10-CM | POA: Insufficient documentation

## 2023-11-11 DIAGNOSIS — Z923 Personal history of irradiation: Secondary | ICD-10-CM | POA: Insufficient documentation

## 2023-11-11 DIAGNOSIS — Z5112 Encounter for antineoplastic immunotherapy: Secondary | ICD-10-CM | POA: Diagnosis not present

## 2023-11-11 LAB — CBC WITH DIFFERENTIAL/PLATELET
Abs Immature Granulocytes: 0.01 10*3/uL (ref 0.00–0.07)
Basophils Absolute: 0 10*3/uL (ref 0.0–0.1)
Basophils Relative: 1 %
Eosinophils Absolute: 0.2 10*3/uL (ref 0.0–0.5)
Eosinophils Relative: 5 %
HCT: 34.8 % — ABNORMAL LOW (ref 36.0–46.0)
Hemoglobin: 10.7 g/dL — ABNORMAL LOW (ref 12.0–15.0)
Immature Granulocytes: 0 %
Lymphocytes Relative: 18 %
Lymphs Abs: 0.5 10*3/uL — ABNORMAL LOW (ref 0.7–4.0)
MCH: 27.5 pg (ref 26.0–34.0)
MCHC: 30.7 g/dL (ref 30.0–36.0)
MCV: 89.5 fL (ref 80.0–100.0)
Monocytes Absolute: 0.3 10*3/uL (ref 0.1–1.0)
Monocytes Relative: 9 %
Neutro Abs: 2 10*3/uL (ref 1.7–7.7)
Neutrophils Relative %: 67 %
Platelets: 107 10*3/uL — ABNORMAL LOW (ref 150–400)
RBC: 3.89 MIL/uL (ref 3.87–5.11)
RDW: 15.3 % (ref 11.5–15.5)
WBC: 3 10*3/uL — ABNORMAL LOW (ref 4.0–10.5)
nRBC: 0 % (ref 0.0–0.2)

## 2023-11-11 LAB — COMPREHENSIVE METABOLIC PANEL WITH GFR
ALT: 12 U/L (ref 0–44)
AST: 22 U/L (ref 15–41)
Albumin: 3.2 g/dL — ABNORMAL LOW (ref 3.5–5.0)
Alkaline Phosphatase: 120 U/L (ref 38–126)
Anion gap: 9 (ref 5–15)
BUN: 12 mg/dL (ref 8–23)
CO2: 27 mmol/L (ref 22–32)
Calcium: 8.8 mg/dL — ABNORMAL LOW (ref 8.9–10.3)
Chloride: 101 mmol/L (ref 98–111)
Creatinine, Ser: 0.78 mg/dL (ref 0.44–1.00)
GFR, Estimated: >60 mL/min (ref >60)
Glucose, Bld: 112 mg/dL — ABNORMAL HIGH (ref 70–99)
Potassium: 3.7 mmol/L (ref 3.5–5.1)
Sodium: 137 mmol/L (ref 135–145)
Total Bilirubin: 0.5 mg/dL (ref 0.0–1.2)
Total Protein: 6.3 g/dL — ABNORMAL LOW (ref 6.5–8.1)

## 2023-11-11 LAB — MAGNESIUM: Magnesium: 1.9 mg/dL (ref 1.7–2.4)

## 2023-11-11 LAB — TSH: TSH: 18.09 u[IU]/mL — ABNORMAL HIGH (ref 0.350–4.500)

## 2023-11-11 LAB — TROPONIN I (HIGH SENSITIVITY): Troponin I (High Sensitivity): 15 ng/L (ref <18)

## 2023-11-11 MED ORDER — FAMOTIDINE IN NACL 20-0.9 MG/50ML-% IV SOLN
20.0000 mg | Freq: Once | INTRAVENOUS | Status: AC
  Administered 2023-11-11: 20 mg via INTRAVENOUS
  Filled 2023-11-11: qty 50

## 2023-11-11 MED ORDER — SODIUM CHLORIDE 0.9% FLUSH
10.0000 mL | INTRAVENOUS | Status: DC | PRN
  Administered 2023-11-11: 10 mL

## 2023-11-11 MED ORDER — SODIUM CHLORIDE 0.9 % IV SOLN: Freq: Once | INTRAVENOUS | Status: AC

## 2023-11-11 MED ORDER — CETIRIZINE HCL 10 MG/ML IV SOLN
5.0000 mg | Freq: Once | INTRAVENOUS | Status: AC
  Administered 2023-11-11: 5 mg via INTRAVENOUS
  Filled 2023-11-11: qty 1

## 2023-11-11 MED ORDER — SODIUM CHLORIDE FLUSH 0.9 % IV SOLN
10.0000 mL | Freq: Once | INTRAVENOUS | Status: AC
  Administered 2023-11-11: 10 mL via INTRAVENOUS
  Filled 2023-11-11: qty 10

## 2023-11-11 MED ORDER — HEPARIN SOD (PORK) LOCK FLUSH 100 UNIT/ML IV SOLN
500.0000 [IU] | Freq: Once | INTRAVENOUS | Status: AC | PRN
  Administered 2023-11-11: 500 [IU]

## 2023-11-11 MED ORDER — LEVOTHYROXINE SODIUM 125 MCG PO TABS: 125.0000 ug | ORAL_TABLET | Freq: Every day | ORAL | 3 refills | Status: AC

## 2023-11-11 MED ORDER — SODIUM CHLORIDE 0.9 % IV SOLN
200.0000 mg | Freq: Once | INTRAVENOUS | Status: AC
  Administered 2023-11-11: 200 mg via INTRAVENOUS
  Filled 2023-11-11: qty 8

## 2023-11-11 MED ORDER — SODIUM CHLORIDE 0.9 % IV SOLN
1.0000 mg/kg | Freq: Once | INTRAVENOUS | Status: AC
  Administered 2023-11-11: 70 mg via INTRAVENOUS
  Filled 2023-11-11: qty 14

## 2023-11-11 NOTE — Progress Notes (Signed)
 Per Dr. Charmain recommendation, clarified that patient has been taking 100 mcg Synthroid  daily on empty stomach and does not miss any doses.  Advised patient that synthroid  will be increased to 125 mcg daily due to elevated thyroid  level.  Verbalized understanding.

## 2023-11-11 NOTE — Patient Instructions (Signed)
 [facility] - A DEPT OF [facility]  Discharge Instructions: Thank you for choosing [facility] to provide your oncology and hematology care.  If you have a lab appointment with the Cancer Center - please note that after April 8th, 2024, all labs will be drawn in the cancer center.  You do not have to check in or register with the main entrance as you have in the past but will complete your check-in in the cancer center.  Wear comfortable clothing and clothing appropriate for easy access to any Portacath or PICC line.   We strive to give you quality time with your provider. You may need to reschedule your appointment if you arrive late (15 or more minutes).  Arriving late affects you and other patients whose appointments are after yours.  Also, if you miss three or more appointments without notifying the office, you may be dismissed from the clinic at the provider's discretion.      For prescription refill requests, have your pharmacy contact our office and allow 72 hours for refills to be completed.    Today you received the following chemotherapy and/or immunotherapy agents keytruda , yervoy       To help prevent nausea and vomiting after your treatment, we encourage you to take your nausea medication as directed.  BELOW ARE SYMPTOMS THAT SHOULD BE REPORTED IMMEDIATELY: *FEVER GREATER THAN 100.4 F (38 C) OR HIGHER *CHILLS OR SWEATING *NAUSEA AND VOMITING THAT IS NOT CONTROLLED WITH YOUR NAUSEA MEDICATION *UNUSUAL SHORTNESS OF BREATH *UNUSUAL BRUISING OR BLEEDING *URINARY PROBLEMS (pain or burning when urinating, or frequent urination) *BOWEL PROBLEMS (unusual diarrhea, constipation, pain near the anus) TENDERNESS IN MOUTH AND THROAT WITH OR WITHOUT PRESENCE OF ULCERS (sore throat, sores in mouth, or a toothache) UNUSUAL RASH, SWELLING OR PAIN  UNUSUAL VAGINAL DISCHARGE OR ITCHING   Items with * indicate a potential emergency and should be  followed up as soon as possible or go to the Emergency Department if any problems should occur.  Please show the CHEMOTHERAPY ALERT CARD or IMMUNOTHERAPY ALERT CARD at check-in to the Emergency Department and triage nurse.  Should you have questions after your visit or need to cancel or reschedule your appointment, please contact [facility] [place] - A DEPT OF [facility] [phone]  and follow the prompts.  Office hours are 8:00 a.m. to 4:30 p.m. Monday - Friday. Please note that voicemails left after 4:00 p.m. may not be returned until the following business day.  We are closed weekends and major holidays. You have access to a nurse at all times for urgent questions. Please call the main number to the clinic [phone] and follow the prompts.  For any non-urgent questions, you may also contact your provider using MyChart. We now offer e-Visits for anyone 44 and older to request care online for non-urgent symptoms. For details visit [email or web address].   Also download the MyChart app! Go to the app store, search MyChart, open the app, select [facility], and log in with your MyChart username and password.

## 2023-11-11 NOTE — Progress Notes (Signed)
 Labs reviewed today , ok to treat per parameters. No new issues reported by patient today   Treatment given per orders. Patient tolerated it well without problems. Vitals stable and discharged home from clinic ambulatory. Follow up as scheduled.

## 2023-11-12 LAB — T4: T4, Total: 7.8 ug/dL (ref 4.5–12.0)

## 2023-11-16 DIAGNOSIS — J40 Bronchitis, not specified as acute or chronic: Secondary | ICD-10-CM | POA: Diagnosis not present

## 2023-11-23 ENCOUNTER — Other Ambulatory Visit: Payer: Self-pay

## 2023-12-01 NOTE — Progress Notes (Signed)
 [facility] [address] [place] [address]    Clinic Day:  12/02/2023  Referring physician: [name] LABOR, MD  Patient Care Team: [name] LABOR, MD as PCP - General (Internal Medicine) Elmira Newman PARAS, MD as PCP - Cardiology (Cardiology) Rogers Hai, MD as Medical Oncologist (Medical Oncology) Celestia Joesph SQUIBB, RN as Oncology Nurse Navigator (Oncology)   ASSESSMENT & PLAN:   Assessment: 1. Malignant melanoma of right posterior leg (pT4 pN3 M1): - She noticed fleshy lesion 4 months ago on the right leg. - Her PMD did a biopsy which was consistent with melanoma. - Wide local excision and lymph node biopsy by Dr. Aron on 02/12/2021. - Pathology margins free, nodular type, Breslow's thickness 9.52 mm, Clark level V, deep margins free, ulceration absent, satellitosis absent, mitotic index 2/millimeters squared, LVI negative.  Neurotropism absent.  Tumor infiltrating lymphocytes absent.  Tumor regression not noted.  3 out of 3 positive sentinel lymph nodes for melanoma. - PET CT scan on 03/22/2021 with 3 foci of intense radiotracer activity within the right lower extremity.  In the medial right upper thigh nodule just superficial to thigh musculature measuring 11 mm with SUV 7.6.  Intramedullary hypermetabolic activity within the shaft of the mid right femur with SUV 14.6.  Intense activity associated with condylar notch of the distal right femur, appears to localize to soft tissue rather than the bone.  More diffuse activity associated with the medial right calf related to excision biopsy.  There is a focus of activity adjacent to the lateral margin of the right iliac wing SUV 4.1.  No evidence of visceral metastatic disease in the chest, abdomen or pelvis. - MRI of the right hip and right knee from 04/16/2021 with solitary bone metastasis in the distal right femoral diaphysis and multiple nodal metastatic disease anteromedially in the proximal right mid  thigh. - NGS testing-BRAF negative, positive for NRAS pathogenic variant exon 3, TERT promoter.  MSI-stable.  MMR-proficient.  NTRK 1/2/3 fusion not detected.  KIT mutation negative.  PD-L1 negative. - Nivolumab -Relatlimab  every 28 days started on 05/09/2021.  Held permanently after first dose due to myocarditis. - PET scan on 07/25/2021: Slight progression with no new sites of metastatic disease. - Pembrolizumab  started on 09/03/2021.  XRT to the soft tissue nodule within the medial right thigh and midshaft right femoral lesion from 03/26/2022 through 04/09/2022. -We have discussed further options including adding a low-dose ipilimumab  to pembrolizumab  which she is already receiving without any side effects.  I have quoted response rates around 30% (J Clin Oncol. 2021 Aug 20;39(24):2647-2655. doi: 10.1200/JCO.21.00079).  We also discussed the possibility increased chance of immunotherapy related side effects with combination of PD-1 and anti-CTL A4 antibody. - We also discussed option of binimetinib as she has NRAS mutation on previous NGS testing.  However this option is also associated with significant side effects and overall response rate of around 20%. - We also discussed other options including adding lenvatinib to pembrolizumab , least favorable option due to lack of robust data.  We have discussed chemotherapy options with dacarbazine/temozolomide among others. - Pembrolizumab  with low-dose ipilimumab  started on 01/14/2023 - Guardant360 (12/2022): NRAS Q61R (?  Binimetinib), T p53 C17 6Y, TERT promoter SNV, TMB 20.8, MSI high not detected. - PET scan (05/21/2022): Postsurgical changes in the gallbladder bed as well as 2 soft tissue nodules in the right upper quadrant of the abdomen.  Stable distal right femur and soft tissue nodule within the medial right  thigh.  New focal area of increased uptake in the anterior inferior aspect of L3 vertebral body with no CT correlate.  Multiple right-sided pelvic  lymph nodes, new from August 2024 PET scan but stable from November 2020 for CT scan. - She had only received 4 total treatments with pembrolizumab  and low-dose ipilimumab  since September.  PET scan showed mild progression.  Considering alternative treatments available (temozolomide, carboplatin plus paclitaxel), we have decided to continue pembrolizumab  and low-dose ipilimumab  for another 3 cycles and rescan her.  She and her son are agreeable. - Guardant360 (06/09/2023): NRAS Q61R (binimetinib), PTEN deletion, exon 3-9 (Capivasertib), PTEN deletion exon 1-6 (Capivasertib), T p53, TERT promoter SNV -PET scan (07/23/2023): Mixed response with enlarging hypermetabolic right inguinal lymph node and small new L5 metastasis.  Stable/decreased hypermetabolic involving right upper quadrant peritoneal nodules, right iliac adenopathy, L5 and right femoral metastasis and treated soft tissue lesion in the medial right thigh. - Completed XRT to the right inguinal lymph node, 5 fractions on 08/31/2023 - We had a prolonged discussion about further treatment options.  We talked about further options including Abraxane (days 1, 8, 15 every 28 days) and binimetinib.  Due to the adverse side effect profile particularly cardiac, not inclined towards binimetinib.  She is not keen on chemotherapy at this time.  Will continue immunotherapy.  Will seek opinion from radiation oncology to see if they can radiate the right groin lymph node as she has oligo progression.   2. Social/family history: - She is seen with her son today.  She lives by herself at home.  She is independent of ADLs and IADLs.  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  She is non-smoker. - Father had colon cancer.  Sister had breast cancer and colon cancer.  Maternal aunt had breast cancer.  Paternal uncle had colon cancer.    Plan: 1. Malignant melanoma of the right posterior leg (pT4 pN3 M1), BRAF V600 negative: -  5 weeks ago she had episode of bronchitis and was treated with antibiotics and cough syrup. - All her respiratory symptoms resolved.  Denies any skin rashes or diarrhea. - Labs today: AST increased to 75 and alk phos 178.  Rest of LFTs are normal.  Creatinine is at baseline.  Last troponin was 15.  CBC was normal.  TSH improved to 1.7. - She is tolerating immunotherapy reasonably well.  She may continue with ipilimumab  and pembrolizumab  today and every 3 weeks.  RTC 9 weeks for follow-up with repeat PET scan.  2.  History of immunotherapy myocarditis: - She does not report any chest pains today.  Last troponin was normal at 15.  Troponin from today is pending.  3.  Right medial thigh pain: - Right medial thigh nodule is stable.  On and off sharp pain is also stable.  4.  Hypothyroidism: - We increased the Synthroid  to 125 mcg on 11/11/2023 when her TSH was 18.  TSH today improved to 1.7.  Will continue close monitoring.   5.  Normocytic anemia: - She has been off of iron supplements.  Hemoglobin is stable at 11.2.  Will monitor ferritin and iron panel intermittently.    Orders Placed This Encounter  Procedures   NM PET Image Restage (PS) Whole Body    Standing Status:   Future    Expected Date:   01/21/2024    Expiration Date:   12/01/2024    If indicated for the ordered procedure, I  authorize the administration of a radiopharmaceutical per Radiology protocol:   Yes    Preferred imaging location?:   [name]     I,Helena R Teague,acting as a scribe for Alean Stands, MD.,have documented all relevant documentation on the behalf of Alean Stands, MD,as directed by  Alean Stands, MD while in the presence of Alean Stands, MD.  I, Alean Stands MD, have reviewed the above documentation for accuracy and completeness, and I agree with the above.    Alean Stands, MD   7/23/202511:13 AM  CHIEF COMPLAINT:   Diagnosis: metastatic malignant melanoma,  BRAF V600 negative    Cancer Staging  Malignant melanoma of lower leg, right (HCC) Staging form: Melanoma of the Skin, AJCC 8th Edition - Clinical stage from 03/27/2021: Stage IV (cT4a, cN3, cM1) - Unsigned    Prior Therapy: none  Current Therapy: Ipilimumab  pembrolizumab  (200) q21d    HISTORY OF PRESENT ILLNESS:   Oncology History  Malignant melanoma of lower leg, right (HCC)  03/27/2021 Initial Diagnosis   Malignant melanoma of lower leg, right (HCC)   05/09/2021 - 05/09/2021 Chemotherapy   Patient is on Treatment Plan : MELANOMA - Opdualag  (nivolumab /relatlimab ) q28d     09/03/2021 - 01/07/2022 Chemotherapy   Patient is on Treatment Plan : MELANOMA Pembrolizumab  (200) q21d     09/03/2021 -  Chemotherapy   Patient is on Treatment Plan : MELANOMA Pembrolizumab  (200) + Yervoy  (1mg /kg) q21d        INTERVAL HISTORY:   Elizabeth Norman is a 83 y.o. female seen for follow-up of metastatic malignant melanoma. She was last seen by me on 10/21/2023.  Today, she states that she is doing well overall. Her appetite level is at 100%. Her energy level is at 100%.   PAST MEDICAL HISTORY:   Past Medical History: Past Medical History:  Diagnosis Date   Arthritis    Hypertension    Malignant melanoma of lower leg, right (HCC) 03/27/2021   Port-A-Cath in place 04/30/2021    Surgical History: Past Surgical History:  Procedure Laterality Date   BIOPSY  04/02/2023   Procedure: BIOPSY;  Surgeon: Aneita Gwendlyn DASEN, MD;  Location: [facility] ENDOSCOPY;  Service: Gastroenterology;;   CATARACT EXTRACTION W/PHACO Right 10/03/2014   Procedure: CATARACT EXTRACTION PHACO AND INTRAOCULAR LENS PLACEMENT (IOC);  Surgeon: Oneil Platts, MD;  Location: [facility] ORS;  Service: Ophthalmology;  Laterality: Right;  CDE:10.09   CATARACT EXTRACTION W/PHACO Left 10/10/2014   Procedure: CATARACT EXTRACTION PHACO AND INTRAOCULAR LENS PLACEMENT (IOC);  Surgeon: Oneil Platts, MD;  Location: [facility] ORS;  Service: Ophthalmology;  Laterality:  Left;  CDE:8.69   CORNEAL 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   CORNEAL TRANSPLANT Left 2020   DENTAL RESTORATION/EXTRACTION WITH X-RAY     ERCP N/A 04/02/2023   Procedure: ENDOSCOPIC RETROGRADE CHOLANGIOPANCREATOGRAPHY (ERCP);  Surgeon: Aneita Gwendlyn DASEN, MD;  Location: [facility] ENDOSCOPY;  Service: Gastroenterology;  Laterality: N/A;   IR IMAGING GUIDED PORT INSERTION  04/05/2021   LEFT HEART CATH AND CORONARY ANGIOGRAPHY N/A 05/28/2021   Procedure: LEFT HEART CATH AND CORONARY ANGIOGRAPHY;  Surgeon: Elmira Newman PARAS, MD;  Location: [facility] INVASIVE CV LAB;  Service: Cardiovascular;  Laterality: N/A;   MELANOMA EXCISION Right 02/12/2021   Procedure: WIDE LOCAL EXCISION RIGHT LOWER LEG MELANOMA , ADVANCEMENT FLAP CLOSURE DEFECT;  Surgeon: Aron Shoulders, MD;  Location: [facility] OR;  Service: General;  Laterality: Right;   REMOVAL OF STONES  04/02/2023   Procedure: REMOVAL OF STONES;  Surgeon: Aneita Gwendlyn DASEN, MD;  Location: [facility] ENDOSCOPY;  Service: Gastroenterology;;  SCLEROTHERAPY  04/02/2023   Procedure: SCLEROTHERAPY;  Surgeon: Aneita Gwendlyn DASEN, MD;  Location: [facility] ENDOSCOPY;  Service: Gastroenterology;;   SENTINEL NODE BIOPSY Right 02/12/2021   Procedure: SENTINEL NODE BIOPSY;  Surgeon: Aron Shoulders, MD;  Location: [facility] OR;  Service: General;  Laterality: Right;   SPHINCTEROTOMY  04/02/2023   Procedure: ANNETT;  Surgeon: Aneita Gwendlyn DASEN, MD;  Location: [facility] ENDOSCOPY;  Service: Gastroenterology;;    Social History: Social History   Socioeconomic History   Marital status: Widowed    Spouse name: Not on file   Number of children: 3   Years of education: Not on file   Highest education level: Not on file  Occupational History   Not on file  Tobacco Use   Smoking status: Never   Smokeless tobacco: Never  Vaping Use   Vaping status: Never Used  Substance and Sexual Activity   Alcohol use: No   Drug use: No   Sexual activity: Not on file  Other Topics Concern   Not on file  Social History Narrative    Not on file   Social Drivers of Health   Financial Resource Strain: Not on file  Food Insecurity: No Food Insecurity (08/04/2023)   Received from [facility]   Hunger Vital Sign    Within the past 12 months, you worried that your food would run out before you got the money to buy more.: Never true    Within the past 12 months, the food you bought just didn't last and you didn't have money to get more.: Never true  Transportation Needs: No Transportation Needs (08/04/2023)   Received from [facility]   PRAPARE - Transportation    Lack of Transportation (Medical): No    Lack of Transportation (Non-Medical): No  Physical Activity: Not on file  Stress: Not on file  Social Connections: Not on file  Intimate Partner Violence: Not At Risk (04/29/2023)   Humiliation, Afraid, Rape, and Kick questionnaire    Fear of Current or Ex-Partner: No    Emotionally Abused: No    Physically Abused: No    Sexually Abused: No    Family History: Family History  Problem Relation Age of Onset   Cancer Father    Hypertension Sister    Hypertension Sister     Current Medications:  Current Outpatient Medications:    ALPRAZolam  (XANAX ) 0.5 MG tablet, Take 0.5 mg by mouth 3 (three) times daily., Disp: , Rfl: 2   calcium  carbonate (OSCAL) 1500 (600 Ca) MG TABS tablet, Take 600 mg of elemental calcium  by mouth 3 (three) times a week., Disp: , Rfl:    cholecalciferol (VITAMIN D) 1000 UNITS tablet, Take 1,000 Units by mouth 3 (three) times a week., Disp: , Rfl:    Cyanocobalamin (VITAMIN B-12 PO), Take 1 tablet by mouth once a week., Disp: , Rfl:    diphenhydramine -acetaminophen  (TYLENOL  PM) 25-500 MG TABS tablet, Take 1 tablet by mouth at bedtime as needed., Disp: , Rfl:    levothyroxine  (SYNTHROID ) 125 MCG tablet, Take 1 tablet (125 mcg total) by mouth daily before breakfast., Disp: 30 tablet, Rfl: 3   prednisoLONE  acetate (PRED FORTE ) 1 % ophthalmic suspension, Place 1 drop into both eyes  daily., Disp: , Rfl:    metoprolol  tartrate (LOPRESSOR ) 25 MG tablet, Take 1 tablet (25 mg total) by mouth 2 (two) times daily. Hold until follow-up with your doctor/cardiologist, Disp: , Rfl:    ondansetron  (ZOFRAN -ODT) 4 MG disintegrating tablet, Take 1 tablet (  4 mg total) by mouth every 6 (six) hours as needed for nausea. (Patient not taking: Reported on 12/02/2023), Disp: 20 tablet, Rfl: 0 No current facility-administered medications for this visit.  Facility-Administered Medications Ordered in Other Visits:    heparin  lock flush 100 unit/mL, 500 Units, Intracatheter, Once PRN, Arrow Tomko, MD   ipilimumab  (YERVOY ) 70 mg in sodium chloride  0.9 % 50 mL chemo infusion, 1 mg/kg (Order-Specific), Intravenous, Once, Jamyson Jirak, MD   pembrolizumab  (KEYTRUDA ) 200 mg in sodium chloride  0.9 % 50 mL chemo infusion, 200 mg, Intravenous, Once, Rogers Hai, MD, Last Rate: 116 mL/hr at 12/02/23 1109, 200 mg at 12/02/23 1109   sodium chloride  flush (NS) 0.9 % injection 10 mL, 10 mL, Intracatheter, PRN, Juanpablo Ciresi, MD   Allergies: Allergies  Allergen Reactions   Tape     Pulls skin, please use paper tape    REVIEW OF SYSTEMS:   Review of Systems  Constitutional:  Negative for chills, fatigue and fever.  HENT:   Negative for lump/mass, mouth sores, nosebleeds, sore throat and trouble swallowing.   Eyes:  Negative for eye problems.  Respiratory:  Negative for cough and shortness of breath.   Cardiovascular:  Negative for chest pain, leg swelling and palpitations.  Gastrointestinal:  Negative for abdominal pain, constipation, diarrhea, nausea and vomiting.  Genitourinary:  Negative for bladder incontinence, difficulty urinating, dysuria, frequency, hematuria and nocturia.   Musculoskeletal:  Negative for arthralgias, back pain, flank pain, myalgias and neck pain.  Skin:  Negative for itching and rash.  Neurological:  Negative for dizziness, headaches and numbness.   Hematological:  Bruises/bleeds easily.  Psychiatric/Behavioral:  Negative for depression, sleep disturbance and suicidal ideas. The patient is not nervous/anxious.   All other systems reviewed and are negative.    VITALS:   Weight 159 lb 13.3 oz (72.5 kg).  Wt Readings from Last 3 Encounters:  12/02/23 162 lb 3.2 oz (73.6 kg)  12/02/23 159 lb 13.3 oz (72.5 kg)  12/02/23 179 lb 3.2 oz (81.3 kg)    Body mass index is 29.23 kg/m.  Performance status (ECOG): 1 - Symptomatic but completely ambulatory  PHYSICAL EXAM:   Physical Exam Vitals and nursing note reviewed. Exam conducted with a chaperone present.  Constitutional:      Appearance: Normal appearance.  Cardiovascular:     Rate and Rhythm: Normal rate and regular rhythm.     Pulses: Normal pulses.     Heart sounds: Normal heart sounds.  Pulmonary:     Effort: Pulmonary effort is normal.     Breath sounds: Normal breath sounds.  Abdominal:     Palpations: Abdomen is soft. There is no hepatomegaly, splenomegaly or mass.     Tenderness: There is no abdominal tenderness.  Musculoskeletal:     Right lower leg: No edema.     Left lower leg: No edema.  Lymphadenopathy:     Cervical: No cervical adenopathy.     Right cervical: No superficial, deep or posterior cervical adenopathy.    Left cervical: No superficial, deep or posterior cervical adenopathy.     Upper Body:     Right upper body: No supraclavicular or axillary adenopathy.     Left upper body: No supraclavicular or axillary adenopathy.  Neurological:     General: No focal deficit present.     Mental Status: She is alert and oriented to person, place, and time.  Psychiatric:        Mood and Affect: Mood normal.  Behavior: Behavior normal.     LABS:   CBC     Component Value Date/Time   WBC 4.3 12/02/2023 0847   RBC 3.97 12/02/2023 0847   HGB 11.2 (L) 12/02/2023 0847   HCT 35.8 (L) 12/02/2023 0847   PLT 153 12/02/2023 0847   MCV 90.2 12/02/2023  0847   MCH 28.2 12/02/2023 0847   MCHC 31.3 12/02/2023 0847   RDW 15.0 12/02/2023 0847   LYMPHSABS 0.6 (L) 12/02/2023 0847   MONOABS 0.4 12/02/2023 0847   EOSABS 0.1 12/02/2023 0847   BASOSABS 0.0 12/02/2023 0847    CMP      Component Value Date/Time   NA 135 12/02/2023 0847   K 4.1 12/02/2023 0847   CL 99 12/02/2023 0847   CO2 25 12/02/2023 0847   GLUCOSE 132 (H) 12/02/2023 0847   BUN 15 12/02/2023 0847   CREATININE 0.76 12/02/2023 0847   CALCIUM  8.6 (L) 12/02/2023 0847   PROT 6.5 12/02/2023 0847   ALBUMIN 3.2 (L) 12/02/2023 0847   AST 75 (H) 12/02/2023 0847   ALT 33 12/02/2023 0847   ALKPHOS 178 (H) 12/02/2023 0847   BILITOT 0.8 12/02/2023 0847   GFRNONAA >60 12/02/2023 0847   GFRAA >60 09/29/2014 1237     No results found for: CEA1, CEA / No results found for: CEA1, CEA No results found for: PSA1 No results found for: CAN199 No results found for: CAN125  No results found for: TOTALPROTELP, ALBUMINELP, A1GS, A2GS, BETS, BETA2SER, GAMS, MSPIKE, SPEI Lab Results  Component Value Date   TIBC 312 06/17/2023   FERRITIN 20 06/17/2023   IRONPCTSAT 10 (L) 06/17/2023   Lab Results  Component Value Date   LDH 348 (H) 03/30/2023   LDH 137 06/05/2022   LDH 144 05/13/2022     STUDIES:   No results found.

## 2023-12-02 ENCOUNTER — Inpatient Hospital Stay

## 2023-12-02 ENCOUNTER — Encounter: Payer: Self-pay | Admitting: Hematology

## 2023-12-02 ENCOUNTER — Inpatient Hospital Stay: Admitting: Hematology

## 2023-12-02 VITALS — BP 125/65 | HR 83 | Temp 97.6°F | Resp 16 | Ht 62.0 in | Wt 179.2 lb

## 2023-12-02 VITALS — Wt 159.8 lb

## 2023-12-02 VITALS — BP 111/50 | HR 67 | Resp 19 | Wt 162.2 lb

## 2023-12-02 DIAGNOSIS — C4371 Malignant melanoma of right lower limb, including hip: Secondary | ICD-10-CM

## 2023-12-02 DIAGNOSIS — Z79620 Long term (current) use of immunosuppressive biologic: Secondary | ICD-10-CM | POA: Diagnosis not present

## 2023-12-02 DIAGNOSIS — Z95828 Presence of other vascular implants and grafts: Secondary | ICD-10-CM

## 2023-12-02 DIAGNOSIS — Z923 Personal history of irradiation: Secondary | ICD-10-CM | POA: Diagnosis not present

## 2023-12-02 DIAGNOSIS — Z5112 Encounter for antineoplastic immunotherapy: Secondary | ICD-10-CM | POA: Diagnosis not present

## 2023-12-02 DIAGNOSIS — C7951 Secondary malignant neoplasm of bone: Secondary | ICD-10-CM | POA: Diagnosis not present

## 2023-12-02 DIAGNOSIS — C774 Secondary and unspecified malignant neoplasm of inguinal and lower limb lymph nodes: Secondary | ICD-10-CM | POA: Diagnosis not present

## 2023-12-02 LAB — COMPREHENSIVE METABOLIC PANEL WITH GFR
ALT: 33 U/L (ref 0–44)
AST: 75 U/L — ABNORMAL HIGH (ref 15–41)
Albumin: 3.2 g/dL — ABNORMAL LOW (ref 3.5–5.0)
Alkaline Phosphatase: 178 U/L — ABNORMAL HIGH (ref 38–126)
Anion gap: 11 (ref 5–15)
BUN: 15 mg/dL (ref 8–23)
CO2: 25 mmol/L (ref 22–32)
Calcium: 8.6 mg/dL — ABNORMAL LOW (ref 8.9–10.3)
Chloride: 99 mmol/L (ref 98–111)
Creatinine, Ser: 0.76 mg/dL (ref 0.44–1.00)
GFR, Estimated: >60 mL/min (ref >60)
Glucose, Bld: 132 mg/dL — ABNORMAL HIGH (ref 70–99)
Potassium: 4.1 mmol/L (ref 3.5–5.1)
Sodium: 135 mmol/L (ref 135–145)
Total Bilirubin: 0.8 mg/dL (ref 0.0–1.2)
Total Protein: 6.5 g/dL (ref 6.5–8.1)

## 2023-12-02 LAB — CBC WITH DIFFERENTIAL/PLATELET
Abs Immature Granulocytes: 0.01 K/uL (ref 0.00–0.07)
Basophils Absolute: 0 K/uL (ref 0.0–0.1)
Basophils Relative: 1 %
Eosinophils Absolute: 0.1 K/uL (ref 0.0–0.5)
Eosinophils Relative: 3 %
HCT: 35.8 % — ABNORMAL LOW (ref 36.0–46.0)
Hemoglobin: 11.2 g/dL — ABNORMAL LOW (ref 12.0–15.0)
Immature Granulocytes: 0 %
Lymphocytes Relative: 15 %
Lymphs Abs: 0.6 K/uL — ABNORMAL LOW (ref 0.7–4.0)
MCH: 28.2 pg (ref 26.0–34.0)
MCHC: 31.3 g/dL (ref 30.0–36.0)
MCV: 90.2 fL (ref 80.0–100.0)
Monocytes Absolute: 0.4 K/uL (ref 0.1–1.0)
Monocytes Relative: 9 %
Neutro Abs: 3.1 K/uL (ref 1.7–7.7)
Neutrophils Relative %: 72 %
Platelets: 153 K/uL (ref 150–400)
RBC: 3.97 MIL/uL (ref 3.87–5.11)
RDW: 15 % (ref 11.5–15.5)
WBC: 4.3 K/uL (ref 4.0–10.5)
nRBC: 0 % (ref 0.0–0.2)

## 2023-12-02 LAB — TSH: TSH: 1.741 u[IU]/mL (ref 0.350–4.500)

## 2023-12-02 LAB — MAGNESIUM: Magnesium: 1.8 mg/dL (ref 1.7–2.4)

## 2023-12-02 LAB — TROPONIN I (HIGH SENSITIVITY): Troponin I (High Sensitivity): 10 ng/L (ref <18)

## 2023-12-02 MED ORDER — HEPARIN SOD (PORK) LOCK FLUSH 100 UNIT/ML IV SOLN
500.0000 [IU] | Freq: Once | INTRAVENOUS | Status: AC | PRN
  Administered 2023-12-02: 500 [IU]

## 2023-12-02 MED ORDER — SODIUM CHLORIDE 0.9 % IV SOLN
1.0000 mg/kg | Freq: Once | INTRAVENOUS | Status: AC
  Administered 2023-12-02: 70 mg via INTRAVENOUS
  Filled 2023-12-02: qty 14

## 2023-12-02 MED ORDER — FAMOTIDINE IN NACL 20-0.9 MG/50ML-% IV SOLN
20.0000 mg | Freq: Once | INTRAVENOUS | Status: AC
  Administered 2023-12-02: 20 mg via INTRAVENOUS
  Filled 2023-12-02: qty 50

## 2023-12-02 MED ORDER — CETIRIZINE HCL 10 MG/ML IV SOLN
5.0000 mg | Freq: Once | INTRAVENOUS | Status: AC
  Administered 2023-12-02: 5 mg via INTRAVENOUS
  Filled 2023-12-02: qty 1

## 2023-12-02 MED ORDER — SODIUM CHLORIDE 0.9% FLUSH
10.0000 mL | INTRAVENOUS | Status: DC | PRN
  Administered 2023-12-02: 10 mL via INTRAVENOUS

## 2023-12-02 MED ORDER — SODIUM CHLORIDE 0.9% FLUSH: 10.0000 mL | INTRAVENOUS | Status: DC | PRN

## 2023-12-02 MED ORDER — SODIUM CHLORIDE 0.9 % IV SOLN: Freq: Once | INTRAVENOUS | Status: AC

## 2023-12-02 MED ORDER — SODIUM CHLORIDE 0.9 % IV SOLN
200.0000 mg | Freq: Once | INTRAVENOUS | Status: AC
  Administered 2023-12-02: 200 mg via INTRAVENOUS
  Filled 2023-12-02: qty 8

## 2023-12-02 NOTE — Progress Notes (Signed)
 Patient has been examined by Dr. Ellin Saba. Vital signs and labs have been reviewed by MD - ANC, Creatinine, LFTs, hemoglobin, and platelets are within treatment parameters per M.D. - pt may proceed with treatment.  Primary RN and pharmacy notified.

## 2023-12-02 NOTE — Patient Instructions (Signed)
 [facility] [place] - A DEPT OF [place]  Discharge Instructions: Thank you for choosing [facility] to provide your oncology and hematology care.  If you have a lab appointment with the Cancer Center - please note that after April 8th, 2024, all labs will be drawn in the cancer center.  You do not have to check in or register with the main entrance as you have in the past but will complete your check-in in the cancer center.  Wear comfortable clothing and clothing appropriate for easy access to any Portacath or PICC line.   We strive to give you quality time with your provider. You may need to reschedule your appointment if you arrive late (15 or more minutes).  Arriving late affects you and other patients whose appointments are after yours.  Also, if you miss three or more appointments without notifying the office, you may be dismissed from the clinic at the provider's discretion.      For prescription refill requests, have your pharmacy contact our office and allow 72 hours for refills to be completed.    Today you received the following chemotherapy and/or immunotherapy agents Keytruda , Yervoy . Pembrolizumab  Injection What is this medication? PEMBROLIZUMAB  (PEM broe LIZ ue mab) treats some types of cancer. It works by helping your immune system slow or stop the spread of cancer cells. It is a monoclonal antibody. This medicine may be used for other purposes; ask your health care provider or pharmacist if you have questions. COMMON BRAND NAME(S): Keytruda  What should I tell my care team before I take this medication? They need to know if you have any of these conditions: Allogeneic stem cell transplant (uses someone else's stem cells) Autoimmune diseases, such as Crohn disease, ulcerative colitis, lupus History of chest radiation Nervous system problems, such as Guillain-Barre syndrome, myasthenia gravis Organ transplant An unusual or allergic  reaction to pembrolizumab , other medications, foods, dyes, or preservatives Pregnant or trying to get pregnant Breast-feeding How should I use this medication? This medication is injected into a vein. It is given by your care team in a hospital or clinic setting. A special MedGuide will be given to you before each treatment. Be sure to read this information carefully each time. Talk to your care team about the use of this medication in children. While it may be prescribed for children as young as 6 months for selected conditions, precautions do apply. Overdosage: If you think you have taken too much of this medicine contact a poison control center or emergency room at once. NOTE: This medicine is only for you. Do not share this medicine with others. What if I miss a dose? Keep appointments for follow-up doses. It is important not to miss your dose. Call your care team if you are unable to keep an appointment. What may interact with this medication? Interactions have not been studied. This list may not describe all possible interactions. Give your health care provider a list of all the medicines, herbs, non-prescription drugs, or dietary supplements you use. Also tell them if you smoke, drink alcohol, or use illegal drugs. Some items may interact with your medicine. What should I watch for while using this medication? Your condition will be monitored carefully while you are receiving this medication. You may need blood work while taking this medication. This medication may cause serious skin reactions. They can happen weeks to months after starting the medication. Contact your care team right away if you  notice fevers or flu-like symptoms with a rash. The rash may be red or purple and then turn into blisters or peeling of the skin. You may also notice a red rash with swelling of the face, lips, or lymph nodes in your neck or under your arms. Tell your care team right away if you have any change in  your eyesight. Talk to your care team if you may be pregnant. Serious birth defects can occur if you take this medication during pregnancy and for 4 months after the last dose. You will need a negative pregnancy test before starting this medication. Contraception is recommended while taking this medication and for 4 months after the last dose. Your care team can help you find the option that works for you. Do not breastfeed while taking this medication and for 4 months after the last dose. What side effects may I notice from receiving this medication? Side effects that you should report to your care team as soon as possible: Allergic reactions--skin rash, itching, hives, swelling of the face, lips, tongue, or throat Dry cough, shortness of breath or trouble breathing Eye pain, redness, irritation, or discharge with blurry or decreased vision Heart muscle inflammation--unusual weakness or fatigue, shortness of breath, chest pain, fast or irregular heartbeat, dizziness, swelling of the ankles, feet, or hands Hormone gland problems--headache, sensitivity to light, unusual weakness or fatigue, dizziness, fast or irregular heartbeat, increased sensitivity to cold or heat, excessive sweating, constipation, hair loss, increased thirst or amount of urine, tremors or shaking, irritability Infusion reactions--chest pain, shortness of breath or trouble breathing, feeling faint or lightheaded Kidney injury (glomerulonephritis)--decrease in the amount of urine, red or dark brown urine, foamy or bubbly urine, swelling of the ankles, hands, or feet Liver injury--right upper belly pain, loss of appetite, nausea, light-colored stool, dark yellow or brown urine, yellowing skin or eyes, unusual weakness or fatigue Pain, tingling, or numbness in the hands or feet, muscle weakness, change in vision, confusion or trouble speaking, loss of balance or coordination, trouble walking, seizures Rash, fever, and swollen lymph  nodes Redness, blistering, peeling, or loosening of the skin, including inside the mouth Sudden or severe stomach pain, bloody diarrhea, fever, nausea, vomiting Side effects that usually do not require medical attention (report to your care team if they continue or are bothersome): Bone, joint, or muscle pain Diarrhea Fatigue Loss of appetite Nausea Skin rash This list may not describe all possible side effects. Call your doctor for medical advice about side effects. You may report side effects to FDA at 1-800-FDA-1088. Where should I keep my medication? This medication is given in a hospital or clinic. It will not be stored at home. NOTE: This sheet is a summary. It may not cover all possible information. If you have questions about this medicine, talk to your doctor, pharmacist, or health care provider.  2024 Elsevier/Gold Standard (2021-09-10 00:00:00)      To help prevent nausea and vomiting after your treatment, we encourage you to take your nausea medication as directed.  BELOW ARE SYMPTOMS THAT SHOULD BE REPORTED IMMEDIATELY: *FEVER GREATER THAN 100.4 F (38 C) OR HIGHER *CHILLS OR SWEATING *NAUSEA AND VOMITING THAT IS NOT CONTROLLED WITH YOUR NAUSEA MEDICATION *UNUSUAL SHORTNESS OF BREATH *UNUSUAL BRUISING OR BLEEDING *URINARY PROBLEMS (pain or burning when urinating, or frequent urination) *BOWEL PROBLEMS (unusual diarrhea, constipation, pain near the anus) TENDERNESS IN MOUTH AND THROAT WITH OR WITHOUT PRESENCE OF ULCERS (sore throat, sores in mouth, or a toothache) UNUSUAL RASH, SWELLING  OR PAIN  UNUSUAL VAGINAL DISCHARGE OR ITCHING   Items with * indicate a potential emergency and should be followed up as soon as possible or go to the Emergency Department if any problems should occur.  Please show the CHEMOTHERAPY ALERT CARD or IMMUNOTHERAPY ALERT CARD at check-in to the Emergency Department and triage nurse.  Should you have questions after your visit or need to cancel  or reschedule your appointment, please contact [facility] [place] - A DEPT OF [facility] [phone]  and follow the prompts.  Office hours are 8:00 a.m. to 4:30 p.m. Monday - Friday. Please note that voicemails left after 4:00 p.m. may not be returned until the following business day.  We are closed weekends and major holidays. You have access to a nurse at all times for urgent questions. Please call the main number to the clinic [phone] and follow the prompts.  For any non-urgent questions, you may also contact your provider using MyChart. We now offer e-Visits for anyone 37 and older to request care online for non-urgent symptoms. For details visit [email or web address].   Also download the MyChart app! Go to the app store, search MyChart, open the app, select [place], and log in with your MyChart username and password.

## 2023-12-02 NOTE — Progress Notes (Signed)
 Patients port flushed without difficulty.  Good blood return noted with no bruising or swelling noted at site.  Patient remains accessed for treatment.

## 2023-12-02 NOTE — Progress Notes (Signed)
 Message received from A.Lenon RN  / Dr. Katragadda to proceed with treatment. Troponin ordered by B.Taylen Wendland RN. Patient denies any changes or side effects since her last treatment. MAR reviewed.  Troponin 10.   Ipilimumab  (YERVOY ) Patient Monitoring Assessment   Is the patient experiencing any of the following general symptoms?:  [ ] Difficulty performing normal activities [ ] Feeling sluggish or cold all the time [ ] Unusual weight gain [ ] Constant or unusual headaches [ ] Feeling dizzy or faint [ ] Changes in eyesight (blurry vision, double vision, or other vision problems) [ ] Changes in mood or behavior (ex: decreased sex drive, irritability, or forgetfulness) [ ] Starting new medications (ex: steroids, other medications that lower immune response) [ x]Patient is not experiencing any of the general symptoms above.   Gastrointestinal  Patient is having 1 bowel movements each day.  Is this different from baseline? [ ] Yes [ x]No Are your stools watery or do they have a foul smell? [ ] Yes [x ]No Have you seen blood in your stools? [ ] Yes [x ]No Are your stools dark, tarry, or sticky? [ ] Yes [ ] No Are you having pain or tenderness in your belly? [ ] Yes [ x]No  Skin Does your skin itch? [ ] Yes [ x]No Do you have a rash? [ ] Yes [ x]No Has your skin blistered and/or peeled? [ ] Yes [ x]No Do you have sores in your mouth? [ ] Yes [x ]No  Hepatic Has your urine been dark or tea colored? [ ] Yes [x ]No Have you noticed that your skin or the whites of your eyes are turning yellow? [ ] Yes [ x]No Are you bleeding or bruising more easily than normal? [ ] Yes [ x]No Are you nauseous and/or vomiting? [ ] Yes [x ]No Do you have pain on the right side of your stomach? [ ] Yes [ x]No  Neurologic  Are you having unusual weakness of legs, arms, or face? [ ] Yes [x ]No Are you having numbness or tingling in your hands or feet? [ ] Yes [ x]No  Leotis BIRCH Sanaii Caporaso Treatment given today per MD orders.  Tolerated infusion without adverse affects. Vital signs stable. No complaints at this time. Discharged from clinic ambulatory in stable condition. Alert and oriented x 3. F/U with [facility] as scheduled.

## 2023-12-02 NOTE — Patient Instructions (Signed)

## 2023-12-03 ENCOUNTER — Other Ambulatory Visit: Payer: Self-pay

## 2023-12-03 LAB — T4: T4, Total: 10.2 ug/dL (ref 4.5–12.0)

## 2023-12-23 ENCOUNTER — Encounter: Payer: Self-pay | Admitting: Oncology

## 2023-12-23 ENCOUNTER — Inpatient Hospital Stay: Attending: Hematology

## 2023-12-23 ENCOUNTER — Inpatient Hospital Stay

## 2023-12-23 VITALS — BP 119/70 | HR 67 | Temp 97.2°F | Resp 18

## 2023-12-23 DIAGNOSIS — C786 Secondary malignant neoplasm of retroperitoneum and peritoneum: Secondary | ICD-10-CM | POA: Diagnosis not present

## 2023-12-23 DIAGNOSIS — C7951 Secondary malignant neoplasm of bone: Secondary | ICD-10-CM | POA: Insufficient documentation

## 2023-12-23 DIAGNOSIS — C778 Secondary and unspecified malignant neoplasm of lymph nodes of multiple regions: Secondary | ICD-10-CM | POA: Diagnosis not present

## 2023-12-23 DIAGNOSIS — C4371 Malignant melanoma of right lower limb, including hip: Secondary | ICD-10-CM | POA: Insufficient documentation

## 2023-12-23 DIAGNOSIS — Z95828 Presence of other vascular implants and grafts: Secondary | ICD-10-CM

## 2023-12-23 DIAGNOSIS — Z5112 Encounter for antineoplastic immunotherapy: Secondary | ICD-10-CM | POA: Diagnosis not present

## 2023-12-23 LAB — COMPREHENSIVE METABOLIC PANEL WITH GFR
ALT: 19 U/L (ref 0–44)
AST: 32 U/L (ref 15–41)
Albumin: 3.4 g/dL — ABNORMAL LOW (ref 3.5–5.0)
Alkaline Phosphatase: 165 U/L — ABNORMAL HIGH (ref 38–126)
Anion gap: 10 (ref 5–15)
BUN: 16 mg/dL (ref 8–23)
CO2: 26 mmol/L (ref 22–32)
Calcium: 9 mg/dL (ref 8.9–10.3)
Chloride: 102 mmol/L (ref 98–111)
Creatinine, Ser: 0.77 mg/dL (ref 0.44–1.00)
GFR, Estimated: >60 mL/min (ref >60)
Glucose, Bld: 101 mg/dL — ABNORMAL HIGH (ref 70–99)
Potassium: 4.1 mmol/L (ref 3.5–5.1)
Sodium: 138 mmol/L (ref 135–145)
Total Bilirubin: 0.7 mg/dL (ref 0.0–1.2)
Total Protein: 7 g/dL (ref 6.5–8.1)

## 2023-12-23 LAB — CBC WITH DIFFERENTIAL/PLATELET
Abs Immature Granulocytes: 0.01 K/uL (ref 0.00–0.07)
Basophils Absolute: 0 K/uL (ref 0.0–0.1)
Basophils Relative: 1 %
Eosinophils Absolute: 0.2 K/uL (ref 0.0–0.5)
Eosinophils Relative: 3 %
HCT: 37.8 % (ref 36.0–46.0)
Hemoglobin: 11.7 g/dL — ABNORMAL LOW (ref 12.0–15.0)
Immature Granulocytes: 0 %
Lymphocytes Relative: 17 %
Lymphs Abs: 0.7 K/uL (ref 0.7–4.0)
MCH: 27.9 pg (ref 26.0–34.0)
MCHC: 31 g/dL (ref 30.0–36.0)
MCV: 90.2 fL (ref 80.0–100.0)
Monocytes Absolute: 0.4 K/uL (ref 0.1–1.0)
Monocytes Relative: 9 %
Neutro Abs: 3.1 K/uL (ref 1.7–7.7)
Neutrophils Relative %: 70 %
Platelets: 169 K/uL (ref 150–400)
RBC: 4.19 MIL/uL (ref 3.87–5.11)
RDW: 14.6 % (ref 11.5–15.5)
WBC: 4.4 K/uL (ref 4.0–10.5)
nRBC: 0 % (ref 0.0–0.2)

## 2023-12-23 LAB — TROPONIN I (HIGH SENSITIVITY): Troponin I (High Sensitivity): 11 ng/L (ref <18)

## 2023-12-23 LAB — MAGNESIUM: Magnesium: 1.8 mg/dL (ref 1.7–2.4)

## 2023-12-23 MED ORDER — FAMOTIDINE IN NACL 20-0.9 MG/50ML-% IV SOLN
20.0000 mg | Freq: Once | INTRAVENOUS | Status: AC
  Administered 2023-12-23 (×2): 20 mg via INTRAVENOUS
  Filled 2023-12-23: qty 50

## 2023-12-23 MED ORDER — SODIUM CHLORIDE 0.9 % IV SOLN
1.0000 mg/kg | Freq: Once | INTRAVENOUS | Status: AC
  Administered 2023-12-23 (×2): 70 mg via INTRAVENOUS
  Filled 2023-12-23: qty 14

## 2023-12-23 MED ORDER — CETIRIZINE HCL 10 MG/ML IV SOLN
5.0000 mg | Freq: Once | INTRAVENOUS | Status: AC
  Administered 2023-12-23 (×2): 5 mg via INTRAVENOUS
  Filled 2023-12-23: qty 1

## 2023-12-23 MED ORDER — SODIUM CHLORIDE 0.9 % IV SOLN
Freq: Once | INTRAVENOUS | Status: AC
Start: 2023-12-23 — End: 2023-12-23

## 2023-12-23 MED ORDER — SODIUM CHLORIDE 0.9 % IV SOLN
200.0000 mg | Freq: Once | INTRAVENOUS | Status: AC
  Administered 2023-12-23 (×2): 200 mg via INTRAVENOUS
  Filled 2023-12-23: qty 8

## 2023-12-23 NOTE — Patient Instructions (Signed)
 [facility] [place] - A DEPT OF [place]  Discharge Instructions: Thank you for choosing [facility] to provide your oncology and hematology care.  If you have a lab appointment with the Cancer Center - please note that after April 8th, 2024, all labs will be drawn in the cancer center.  You do not have to check in or register with the main entrance as you have in the past but will complete your check-in in the cancer center.  Wear comfortable clothing and clothing appropriate for easy access to any Portacath or PICC line.   We strive to give you quality time with your provider. You may need to reschedule your appointment if you arrive late (15 or more minutes).  Arriving late affects you and other patients whose appointments are after yours.  Also, if you miss three or more appointments without notifying the office, you may be dismissed from the clinic at the provider's discretion.      For prescription refill requests, have your pharmacy contact our office and allow 72 hours for refills to be completed.    Today you received the following chemotherapy and/or immunotherapy agents Keytruda /Yervoy    To help prevent nausea and vomiting after your treatment, we encourage you to take your nausea medication as directed.  Pembrolizumab  Injection What is this medication? PEMBROLIZUMAB  (PEM broe LIZ ue mab) treats some types of cancer. It works by helping your immune system slow or stop the spread of cancer cells. It is a monoclonal antibody. This medicine may be used for other purposes; ask your health care provider or pharmacist if you have questions. COMMON BRAND NAME(S): Keytruda  What should I tell my care team before I take this medication? They need to know if you have any of these conditions: Allogeneic stem cell transplant (uses someone else's stem cells) Autoimmune diseases, such as Crohn disease, ulcerative colitis, lupus History of chest  radiation Nervous system problems, such as Guillain-Barre syndrome, myasthenia gravis Organ transplant An unusual or allergic reaction to pembrolizumab , other medications, foods, dyes, or preservatives Pregnant or trying to get pregnant Breast-feeding How should I use this medication? This medication is injected into a vein. It is given by your care team in a hospital or clinic setting. A special MedGuide will be given to you before each treatment. Be sure to read this information carefully each time. Talk to your care team about the use of this medication in children. While it may be prescribed for children as young as 6 months for selected conditions, precautions do apply. Overdosage: If you think you have taken too much of this medicine contact a poison control center or emergency room at once. NOTE: This medicine is only for you. Do not share this medicine with others. What if I miss a dose? Keep appointments for follow-up doses. It is important not to miss your dose. Call your care team if you are unable to keep an appointment. What may interact with this medication? Interactions have not been studied. This list may not describe all possible interactions. Give your health care provider a list of all the medicines, herbs, non-prescription drugs, or dietary supplements you use. Also tell them if you smoke, drink alcohol, or use illegal drugs. Some items may interact with your medicine. What should I watch for while using this medication? Your condition will be monitored carefully while you are receiving this medication. You may need blood work while taking this medication. This medication may cause  serious skin reactions. They can happen weeks to months after starting the medication. Contact your care team right away if you notice fevers or flu-like symptoms with a rash. The rash may be red or purple and then turn into blisters or peeling of the skin. You may also notice a red rash with  swelling of the face, lips, or lymph nodes in your neck or under your arms. Tell your care team right away if you have any change in your eyesight. Talk to your care team if you may be pregnant. Serious birth defects can occur if you take this medication during pregnancy and for 4 months after the last dose. You will need a negative pregnancy test before starting this medication. Contraception is recommended while taking this medication and for 4 months after the last dose. Your care team can help you find the option that works for you. Do not breastfeed while taking this medication and for 4 months after the last dose. What side effects may I notice from receiving this medication? Side effects that you should report to your care team as soon as possible: Allergic reactions--skin rash, itching, hives, swelling of the face, lips, tongue, or throat Dry cough, shortness of breath or trouble breathing Eye pain, redness, irritation, or discharge with blurry or decreased vision Heart muscle inflammation--unusual weakness or fatigue, shortness of breath, chest pain, fast or irregular heartbeat, dizziness, swelling of the ankles, feet, or hands Hormone gland problems--headache, sensitivity to light, unusual weakness or fatigue, dizziness, fast or irregular heartbeat, increased sensitivity to cold or heat, excessive sweating, constipation, hair loss, increased thirst or amount of urine, tremors or shaking, irritability Infusion reactions--chest pain, shortness of breath or trouble breathing, feeling faint or lightheaded Kidney injury (glomerulonephritis)--decrease in the amount of urine, red or dark brown urine, foamy or bubbly urine, swelling of the ankles, hands, or feet Liver injury--right upper belly pain, loss of appetite, nausea, light-colored stool, dark yellow or brown urine, yellowing skin or eyes, unusual weakness or fatigue Pain, tingling, or numbness in the hands or feet, muscle weakness, change in  vision, confusion or trouble speaking, loss of balance or coordination, trouble walking, seizures Rash, fever, and swollen lymph nodes Redness, blistering, peeling, or loosening of the skin, including inside the mouth Sudden or severe stomach pain, bloody diarrhea, fever, nausea, vomiting Side effects that usually do not require medical attention (report to your care team if they continue or are bothersome): Bone, joint, or muscle pain Diarrhea Fatigue Loss of appetite Nausea Skin rash This list may not describe all possible side effects. Call your doctor for medical advice about side effects. You may report side effects to FDA at 1-800-FDA-1088. Where should I keep my medication? This medication is given in a hospital or clinic. It will not be stored at home. NOTE: This sheet is a summary. It may not cover all possible information. If you have questions about this medicine, talk to your doctor, pharmacist, or health care provider.  2024 Elsevier/Gold Standard (2021-09-10 00:00:00)     BELOW ARE SYMPTOMS THAT SHOULD BE REPORTED IMMEDIATELY: *FEVER GREATER THAN 100.4 F (38 C) OR HIGHER *CHILLS OR SWEATING *NAUSEA AND VOMITING THAT IS NOT CONTROLLED WITH YOUR NAUSEA MEDICATION *UNUSUAL SHORTNESS OF BREATH *UNUSUAL BRUISING OR BLEEDING *URINARY PROBLEMS (pain or burning when urinating, or frequent urination) *BOWEL PROBLEMS (unusual diarrhea, constipation, pain near the anus) TENDERNESS IN MOUTH AND THROAT WITH OR WITHOUT PRESENCE OF ULCERS (sore throat, sores in mouth, or a toothache) UNUSUAL RASH, SWELLING  OR PAIN  UNUSUAL VAGINAL DISCHARGE OR ITCHING   Items with * indicate a potential emergency and should be followed up as soon as possible or go to the Emergency Department if any problems should occur.  Please show the CHEMOTHERAPY ALERT CARD or IMMUNOTHERAPY ALERT CARD at check-in to the Emergency Department and triage nurse.  Should you have questions after your visit or need  to cancel or reschedule your appointment, please contact [facility] - A DEPT OF [facility] [phone]  and follow the prompts.  Office hours are 8:00 a.m. to 4:30 p.m. Monday - Friday. Please note that voicemails left after 4:00 p.m. may not be returned until the following business day.  We are closed weekends and major holidays. You have access to a nurse at all times for urgent questions. Please call the main number to the clinic [phone] and follow the prompts.  For any non-urgent questions, you may also contact your provider using MyChart. We now offer e-Visits for anyone 51 and older to request care online for non-urgent symptoms. For details visit [email or web address].   Also download the MyChart app! Go to the app store, search MyChart, open the app, select [place], and log in with your MyChart username and password.

## 2023-12-23 NOTE — Progress Notes (Signed)
 Ipilimumab  (YERVOY ) Patient Monitoring Assessment   Is the patient experiencing any of the following general symptoms?:  [ ] Difficulty performing normal activities [ ] Feeling sluggish or cold all the time [ ] Unusual weight gain [ ] Constant or unusual headaches [ ] Feeling dizzy or faint [ ] Changes in eyesight (blurry vision, double vision, or other vision problems) [ ] Changes in mood or behavior (ex: decreased sex drive, irritability, or forgetfulness) [ ] Starting new medications (ex: steroids, other medications that lower immune response) [X ]Patient is not experiencing any of the general symptoms above.   Gastrointestinal  Patient is having 1 bowel movements each day.  Is this different from baseline? [ ] Yes [X ]No Are your stools watery or do they have a foul smell? [ ] Yes [ X]No Have you seen blood in your stools? [ ] Yes [ X]No Are your stools dark, tarry, or sticky? [ ] Yes [X ]No Are you having pain or tenderness in your belly? [ ] Yes [ X]No  Skin Does your skin itch? [ ] Yes [ X]No Do you have a rash? [ ] Yes [X ]No Has your skin blistered and/or peeled? [ ] Yes [X ]No Do you have sores in your mouth? [ ] Yes [ X]No  Hepatic Has your urine been dark or tea colored? [ ] Yes [ X]No Have you noticed that your skin or the whites of your eyes are turning yellow? [ ] Yes [ X]No Are you bleeding or bruising more easily than normal? [ ] Yes [ X]No Are you nauseous and/or vomiting? [ ] Yes [ X]No Do you have pain on the right side of your stomach? [ ] Yes [X ]No  Neurologic  Are you having unusual weakness of legs, arms, or face? [ ] Yes [ X]No Are you having numbness or tingling in your hands or feet? [ ] Yes [ X]No  Elizabeth Norman

## 2023-12-23 NOTE — Progress Notes (Signed)
 Patient presents today for Keytruda /yervoy  infusion.  Patient is in satisfactory condition with no new complaints voiced.  Vital signs are stable.  Labs reviewed and all labs are within treatment parameters.  We will proceed with treatment per MD orders.    Treatment given today per MD orders. Tolerated infusion without adverse affects. Vital signs stable. No complaints at this time. Discharged from clinic ambulatory in stable condition. Alert and oriented x 3. F/U with [facility] as scheduled.

## 2023-12-28 ENCOUNTER — Other Ambulatory Visit: Payer: Self-pay

## 2024-01-05 DIAGNOSIS — I10 Essential (primary) hypertension: Secondary | ICD-10-CM | POA: Diagnosis not present

## 2024-01-05 DIAGNOSIS — N182 Chronic kidney disease, stage 2 (mild): Secondary | ICD-10-CM | POA: Diagnosis not present

## 2024-01-05 DIAGNOSIS — D692 Other nonthrombocytopenic purpura: Secondary | ICD-10-CM | POA: Diagnosis not present

## 2024-01-05 DIAGNOSIS — M818 Other osteoporosis without current pathological fracture: Secondary | ICD-10-CM | POA: Diagnosis not present

## 2024-01-05 DIAGNOSIS — E7849 Other hyperlipidemia: Secondary | ICD-10-CM | POA: Diagnosis not present

## 2024-01-05 DIAGNOSIS — C439 Malignant melanoma of skin, unspecified: Secondary | ICD-10-CM | POA: Diagnosis not present

## 2024-01-05 DIAGNOSIS — Z0000 Encounter for general adult medical examination without abnormal findings: Secondary | ICD-10-CM | POA: Diagnosis not present

## 2024-01-13 ENCOUNTER — Encounter: Payer: Self-pay | Admitting: Oncology

## 2024-01-13 ENCOUNTER — Inpatient Hospital Stay

## 2024-01-13 ENCOUNTER — Inpatient Hospital Stay: Attending: Hematology

## 2024-01-13 VITALS — BP 132/60 | HR 69 | Temp 97.4°F | Resp 18

## 2024-01-13 DIAGNOSIS — Z79890 Hormone replacement therapy (postmenopausal): Secondary | ICD-10-CM | POA: Diagnosis not present

## 2024-01-13 DIAGNOSIS — I514 Myocarditis, unspecified: Secondary | ICD-10-CM | POA: Insufficient documentation

## 2024-01-13 DIAGNOSIS — Z95828 Presence of other vascular implants and grafts: Secondary | ICD-10-CM

## 2024-01-13 DIAGNOSIS — Z5112 Encounter for antineoplastic immunotherapy: Secondary | ICD-10-CM | POA: Insufficient documentation

## 2024-01-13 DIAGNOSIS — D649 Anemia, unspecified: Secondary | ICD-10-CM | POA: Insufficient documentation

## 2024-01-13 DIAGNOSIS — E032 Hypothyroidism due to medicaments and other exogenous substances: Secondary | ICD-10-CM | POA: Insufficient documentation

## 2024-01-13 DIAGNOSIS — C7951 Secondary malignant neoplasm of bone: Secondary | ICD-10-CM | POA: Insufficient documentation

## 2024-01-13 DIAGNOSIS — Z79620 Long term (current) use of immunosuppressive biologic: Secondary | ICD-10-CM | POA: Insufficient documentation

## 2024-01-13 DIAGNOSIS — C4371 Malignant melanoma of right lower limb, including hip: Secondary | ICD-10-CM | POA: Diagnosis not present

## 2024-01-13 LAB — COMPREHENSIVE METABOLIC PANEL WITH GFR
ALT: 16 U/L (ref 0–44)
AST: 28 U/L (ref 15–41)
Albumin: 3.1 g/dL — ABNORMAL LOW (ref 3.5–5.0)
Alkaline Phosphatase: 208 U/L — ABNORMAL HIGH (ref 38–126)
Anion gap: 9 (ref 5–15)
BUN: 12 mg/dL (ref 8–23)
CO2: 26 mmol/L (ref 22–32)
Calcium: 8.9 mg/dL (ref 8.9–10.3)
Chloride: 100 mmol/L (ref 98–111)
Creatinine, Ser: 0.73 mg/dL (ref 0.44–1.00)
GFR, Estimated: >60 mL/min (ref >60)
Glucose, Bld: 110 mg/dL — ABNORMAL HIGH (ref 70–99)
Potassium: 4.3 mmol/L (ref 3.5–5.1)
Sodium: 135 mmol/L (ref 135–145)
Total Bilirubin: 0.6 mg/dL (ref 0.0–1.2)
Total Protein: 6.8 g/dL (ref 6.5–8.1)

## 2024-01-13 LAB — CBC WITH DIFFERENTIAL/PLATELET
Abs Immature Granulocytes: 0.01 K/uL (ref 0.00–0.07)
Basophils Absolute: 0 K/uL (ref 0.0–0.1)
Basophils Relative: 1 %
Eosinophils Absolute: 0.2 K/uL (ref 0.0–0.5)
Eosinophils Relative: 5 %
HCT: 37.6 % (ref 36.0–46.0)
Hemoglobin: 11.7 g/dL — ABNORMAL LOW (ref 12.0–15.0)
Immature Granulocytes: 0 %
Lymphocytes Relative: 15 %
Lymphs Abs: 0.6 K/uL — ABNORMAL LOW (ref 0.7–4.0)
MCH: 28.2 pg (ref 26.0–34.0)
MCHC: 31.1 g/dL (ref 30.0–36.0)
MCV: 90.6 fL (ref 80.0–100.0)
Monocytes Absolute: 0.4 K/uL (ref 0.1–1.0)
Monocytes Relative: 9 %
Neutro Abs: 2.7 K/uL (ref 1.7–7.7)
Neutrophils Relative %: 70 %
Platelets: 148 K/uL — ABNORMAL LOW (ref 150–400)
RBC: 4.15 MIL/uL (ref 3.87–5.11)
RDW: 14.2 % (ref 11.5–15.5)
WBC: 3.9 K/uL — ABNORMAL LOW (ref 4.0–10.5)
nRBC: 0 % (ref 0.0–0.2)

## 2024-01-13 LAB — MAGNESIUM: Magnesium: 1.9 mg/dL (ref 1.7–2.4)

## 2024-01-13 LAB — TSH: TSH: 3.249 u[IU]/mL (ref 0.350–4.500)

## 2024-01-13 LAB — TROPONIN I (HIGH SENSITIVITY): Troponin I (High Sensitivity): 9 ng/L (ref <18)

## 2024-01-13 MED ORDER — SODIUM CHLORIDE 0.9 % IV SOLN
1.0000 mg/kg | Freq: Once | INTRAVENOUS | Status: AC
  Administered 2024-01-13: 70 mg via INTRAVENOUS
  Filled 2024-01-13: qty 14

## 2024-01-13 MED ORDER — CETIRIZINE HCL 10 MG/ML IV SOLN
5.0000 mg | Freq: Once | INTRAVENOUS | Status: AC
  Administered 2024-01-13: 5 mg via INTRAVENOUS
  Filled 2024-01-13: qty 1

## 2024-01-13 MED ORDER — SODIUM CHLORIDE 0.9 % IV SOLN
200.0000 mg | Freq: Once | INTRAVENOUS | Status: AC
  Administered 2024-01-13: 200 mg via INTRAVENOUS
  Filled 2024-01-13: qty 8

## 2024-01-13 MED ORDER — SODIUM CHLORIDE 0.9 % IV SOLN: Freq: Once | INTRAVENOUS | Status: AC

## 2024-01-13 MED ORDER — FAMOTIDINE IN NACL 20-0.9 MG/50ML-% IV SOLN
20.0000 mg | Freq: Once | INTRAVENOUS | Status: AC
  Administered 2024-01-13: 20 mg via INTRAVENOUS
  Filled 2024-01-13: qty 50

## 2024-01-13 NOTE — Patient Instructions (Signed)
 [facility] [place] [facility]  Discharge Instructions: Thank you for choosing [facility] to provide your oncology and hematology care.  If you have a lab appointment with the Cancer Center - please note that after April 8th, 2024, all labs will be drawn in the cancer center.  You do not have to check in or register with the main entrance as you have in the past but will complete your check-in in the cancer center.  Wear comfortable clothing and clothing appropriate for easy access to any Portacath or PICC line.   We strive to give you quality time with your provider. You may need to reschedule your appointment if you arrive late (15 or more minutes).  Arriving late affects you and other patients whose appointments are after yours.  Also, if you miss three or more appointments without notifying the office, you may be dismissed from the clinic at the provider's discretion.      For prescription refill requests, have your pharmacy contact our office and allow 72 hours for refills to be completed.    Today you received the following chemotherapy and/or immunotherapy agents Keytruda and Yervoy   To help prevent nausea and vomiting after your treatment, we encourage you to take your nausea medication as directed.  BELOW ARE SYMPTOMS THAT SHOULD BE REPORTED IMMEDIATELY: *FEVER GREATER THAN 100.4 F (38 C) OR HIGHER *CHILLS OR SWEATING *NAUSEA AND VOMITING THAT IS NOT CONTROLLED WITH YOUR NAUSEA MEDICATION *UNUSUAL SHORTNESS OF BREATH *UNUSUAL BRUISING OR BLEEDING *URINARY PROBLEMS (pain or burning when urinating, or frequent urination) *BOWEL PROBLEMS (unusual diarrhea, constipation, pain near the anus) TENDERNESS IN MOUTH AND THROAT WITH OR WITHOUT PRESENCE OF ULCERS (sore throat, sores in mouth, or a toothache) UNUSUAL RASH, SWELLING OR PAIN  UNUSUAL VAGINAL DISCHARGE OR ITCHING   Items with * indicate a potential emergency and should be  followed up as soon as possible or go to the Emergency Department if any problems should occur.  Please show the CHEMOTHERAPY ALERT CARD or IMMUNOTHERAPY ALERT CARD at check-in to the Emergency Department and triage nurse.  Should you have questions after your visit or need to cancel or reschedule your appointment, please contact [facility] [place] - A DEPT OF Eligh[facility]ideg[facility] [phone]  and follow the prompts.  Office hours are 8:00 a.m. to 4:30 p.m. Monday - Friday. Please note that voicemails left after 4:00 p.m. may not be returned until the following business day.  We are closed weekends and major holidays. You have access to a nurse at all times for urgent questions. Please call the main number to the clinic [phone] and follow the prompts.  For any non-urgent questions, you may also contact your provider using MyChart. We now offer e-Visits for anyone 91 and older to request care online for non-urgent symptoms. For details visit [email or web address].   Also download the MyChart app! Go to the app store, search "MyChart", open the app, select , and log in with your MyChart username and password.

## 2024-01-13 NOTE — Progress Notes (Signed)
 Ipilimumab  (YERVOY ) Patient Monitoring Assessment   Is the patient experiencing any of the following general symptoms?:  [ ] Difficulty performing normal activities [ ] Feeling sluggish or cold all the time [ ] Unusual weight gain [ ] Constant or unusual headaches [ ] Feeling dizzy or faint [ ] Changes in eyesight (blurry vision, double vision, or other vision problems) [ ] Changes in mood or behavior (ex: decreased sex drive, irritability, or forgetfulness) [ ] Starting new medications (ex: steroids, other medications that lower immune response) [ x]Patient is not experiencing any of the general symptoms above.   Gastrointestinal  Patient is having good bowel movements each day.  Is this different from baseline? [ ] Yes [ x]No Are your stools watery or do they have a foul smell? [ ] Yes [ x]No Have you seen blood in your stools? [ ] Yes [ x]No Are your stools dark, tarry, or sticky? [ ] Yes [x ]No Are you having pain or tenderness in your belly? [ ] Yes [ x]No  Skin Does your skin itch? [ ] Yes [ x]No Do you have a rash? [ ] Yes [ x]No Has your skin blistered and/or peeled? [ ] Yes [ x]No Do you have sores in your mouth? [ ] Yes [x ]No  Hepatic Has your urine been dark or tea colored? [ ] Yes [x ]No Have you noticed that your skin or the whites of your eyes are turning yellow? [ ] Yes [ x]No Are you bleeding or bruising more easily than normal? [ ] Yes [x ]No Are you nauseous and/or vomiting? [ ] Yes [x ]No Do you have pain on the right side of your stomach? [ ] Yes [x ]No  Neurologic  Are you having unusual weakness of legs, arms, or face? [ ] Yes [ x]No Are you having numbness or tingling in your hands or feet? [ ] Yes [ x]No  Elizabeth Norman  Patient tolerated chemotherapy with no complaints voiced.  Side effects with management reviewed with understanding verbalized.  Port site clean and dry with no bruising or swelling noted at site.  Good blood return noted before and after administration  of chemotherapy.  Band aid applied.  Patient left in satisfactory condition with VSS and no s/s of distress noted.

## 2024-01-14 LAB — T4: T4, Total: 9.6 ug/dL (ref 4.5–12.0)

## 2024-01-28 ENCOUNTER — Ambulatory Visit (HOSPITAL_COMMUNITY)
Admission: RE | Admit: 2024-01-28 | Discharge: 2024-01-28 | Disposition: A | Discharge status: Home / Self Care | Source: Ambulatory Visit | Attending: Hematology | Admitting: Hematology

## 2024-01-28 DIAGNOSIS — C439 Malignant melanoma of skin, unspecified: Secondary | ICD-10-CM | POA: Diagnosis not present

## 2024-01-28 DIAGNOSIS — C4371 Malignant melanoma of right lower limb, including hip: Secondary | ICD-10-CM | POA: Insufficient documentation

## 2024-01-28 MED ORDER — FLUDEOXYGLUCOSE F - 18 (FDG) INJECTION
8.3500 | Freq: Once | INTRAVENOUS | Status: AC | PRN
  Administered 2024-01-28: 8.35 via INTRAVENOUS

## 2024-02-02 NOTE — Progress Notes (Addendum)
 Patient Care Team: [name] LABOR, MD as PCP - General (Internal Medicine) Elmira Newman PARAS, MD as PCP - Cardiology (Cardiology) Celestia Joesph SQUIBB, RN as Oncology Nurse Navigator (Oncology)  Clinic Day:  02/02/2024  Referring physician: [name] LABOR, MD   CHIEF COMPLAINT:  CC: Metastatic malignant melanoma  Elizabeth Norman 83 y.o. female was transferred to my care after her prior physician has left.   ASSESSMENT & PLAN:   Assessment & Plan: Elizabeth Norman  is a 83 y.o. female with metastatic malignant melanoma  Assessment & Plan Malignant melanoma of lower leg, right (HCC) Metastatic malignant melanoma initially diagnosed in 2022.  Extensive oncology history below S/p wide local excision and lymph node biopsy.  Adjuvant therapy with Opdualag  resulting in myocarditis requiring hospital admission and hence was discontinued Received radiation to femoral lesion and inguinal lymph node. NGS consistent with negative BRAF, positive NRAS and positive PTEN Second line: Pembrolizumab  Third line: Ipilimumab  added for progression along with pembrolizumab   -Patient is asymptomatic today - We discussed the PET scan results together and reviewed the images.  Patient has mixed response with increase in size of previous lesions, new lesions at T4 and L1 and stable previous lesions.  Considering that she has been having progressive disease for a while and that now she has new lesions, recommended changing treatment at this time - Discussed with the patient that as we are leaning into fourth line of treatment, the response rates are 20 to 30% -Patient has a positive NRAS mutation that has some response to binimetinib .  Discussed risk versus benefits in detail.Binimetinib  monotherapy often causes gastrointestinal symptoms (nausea, diarrhea, vomiting), fatigue, rash, and muscle-related issues like myalgia or elevated creatine phosphokinase. Serious but less frequent risks include ocular toxicities  (e.g. serous retinopathy), liver enzyme elevations, and potential cardiomyopathy or cardiovascular dysfunction. -Will start binimetinib  30 mg twice daily.  Can dose adjust based on her tolerance and response. - Will repeat PET scan in 3 months  Return to clinic in 1 month for assessment of response and tolerance. 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  She knows to contact our office if she develops concerns prior to her next appointment.  90 minutes of total time was spent for this patient encounter, including preparation,review of records,  face-to-face counseling with the patient and coordination of care, physical exam, and documentation of the encounter.    LILLETTE Verneta SAUNDERS Teague,acting as a Neurosurgeon for Mickiel Dry, MD.,have documented all relevant documentation on the behalf of Mickiel Dry, MD,as directed by  Mickiel Dry, MD while in the presence of Mickiel Dry, MD.  I, Mickiel Dry MD, have reviewed the above documentation for accuracy and completeness, and I agree with the above.     Verneta SAUNDERS Ege  [facility]  - A DEPT OF [facility] [address] [place] [address] Dept: [phone] Dept Fax: [phone]   No orders of the defined types were placed in this  encounter.    ONCOLOGY HISTORY:   I have reviewed her chart and materials related to her cancer  extensively and collaborated history with the patient. Summary of oncologic history is as follows:   Diagnosis: Malignant melanoma of the right posterior leg (pT4 pN3 M1), BRAF V600 negative   -Presentation: fleshy lesion on right leg -12/2020: Right lower leg lesion: Biopsy: Malignant melanoma, nodular subtype, Breslow thickness 9.52 mm, no satellitosis, no ulceration, no LVI.  Deep margin was positive.  There were 2 mitosis per millimeter squared.  Path was pT4a. ( Per documentation) -02/12/2021: Wide local lesion excision and lymph node biopsy.  Pathology: Residual malignant melanoma, 7.0 mm. Margins free of melanoma. No lymphovascular invasion present. Three of three positive sentinel lymph nodes for melanoma (3/3). Pathologic staging: pT4a, pN3. MelanA and Sox-10 highlight the melanoma.  -03/22/2021: Initial PET: Three foci of intense metabolic activity within the RIGHT lower extremity consistent with metastatic melanoma. Lesions involve the subcutaneous tissue, intramedullary bone of the RIGHT femur as well as soft tissue activity in the RIGHT condylar notch. Focus of metabolic activity adjacent to the RIGHT iliac wing is indeterminate differential including metastatic melanoma versus trauma. No evidence of visceral metastasis in the chest abdomen pelvis. Focus of intense radiotracer activity at the angle of the RIGHT jaw is favored to localize within the RIGHT parotid gland. Favor primary parotid neoplasm. -04/05/2021: Port insertion -04/10/2021: MRI brain: No intracranial metastatic disease. No acute intracranial abnormality. -04/15/2021: MRI of right hip and right knee: Solitary osseous metastasis posteriorly in the distal right femoral diaphysis. Multiple nodal metastases anteromedially in the proximal to mid right thigh. The hypermetabolic activity in the right knee intercondylar notch on prior PET-CT shows no clear corresponding enhancing lesion and may relate to a small synovial cyst or  inflammation. -04/19/2021:[facility] NGS: BRAF negative, positive for NRAS pathogenic variant exon 3, TERT promoter.  MSI-stable.  MMR-proficient.  NTRK 1/2/3 fusion not detected.  KIT mutation negative.  PD-L1 negative.  -05/09/2021: Nivolumab -Relatlimab  (Opdualag ) every 28 days, discontinued after first dose due to myocarditis -05/2021-07/2021: Off treatment -07/25/2021: PET: Hypermetabolic lymph nodes in the LEFT groin adjacent to lymphadenectomy clips consistent with melanoma recurrence. Hypermetabolic soft tissue nodule in the subcutaneous tissue of the medial RIGHT thigh consistent with melanoma. Hypermetabolic nodule within the medullary space of the midshaft RIGHT femur consistent skeletal metastasis. No evidence of metastatic melanoma above the RIGHT groin lesions. -09/03/2021-01/13/2023: Pembrolizumab    -02/13/2022: PET:  Interval recurrence of FDG avid lesion within the mid shaft of the right femur. Interval increase in size and degree of FDG uptake associated with soft tissue nodule within the medial right thigh. Similar appearance of tracer avid nodule within the deep right parotid gland. This is favored to represent a primary parotid neoplasm. -03/26/2022-04/09/2022: Radiation therapy to soft tissue nodule within right medial thigh and midshaft right femoral lesion  -12/2022: Guardant360: NRAS Q61R (? Binimetinib ), TP 53 C17 6Y, TERT promoter SNV, TMB 20.8, MSI high not detected.  -01/01/2023: PET: Postsurgical changes in the medial right calf. Right femoral osseous metastasis and metastatic lesion/node in the medial right thigh, grossly unchanged. New right pelvic nodal metastases, reflecting progression of disease. -01/14/2023-current: Pembrolizumab  with low-dose ipilimumab   -05/22/2023: PET: There are 2 soft tissue nodules within the right upper quadrant of the abdomen which are tracer avid, new from the previous PET-CT and new from the CT from 03/30/2023. Indeterminate. Peritoneal metastasis  cannot be excluded. Multiple tracer avid right-sided pelvic lymph nodes are identified compatible with nodal metastasis.  These are new compared with the previous PET-CT from 01/01/2023. There is a new focal area of increased radiotracer uptake within the anterior inferior aspect of the L3 vertebral body. No corresponding CT abnormality identified. Findings are concerning for osseous metastasis. Stable appearance of tracer avid lesion within the distal diaphysis of the right femur. Stable appearance of soft tissue nodule within the medial right thigh at the site of treated melanoma. -06/09/2023: Guardant360: NRAS Q61R (binimetinib ), PTEN deletion, exon 3-9 (Capivasertib), PTEN deletion exon 1-6 (Capivasertib), TP 53, TERT promoter SNV  -07/23/2023: PET: Somewhat mixed response to therapy with enlarging and increasingly hypermetabolic right inguinal adenopathy and a new L5 metastasis. Stable or decreased hypermetabolism involving right upper quadrant peritoneal nodules, right iliac chain adenopathy, L5 and right femoral metastases and a treated soft tissue lesion in the medial right thigh. Decreasing hypermetabolism in the gallbladder fossa, possibly postoperative in etiology. Hypermetabolic medial right parotid nodule, stable.  -08/31/2023: Radiation therapy to right inguinal lymph node -01/28/2024: PET: Mixed response to therapy. Interval increase in size and metabolic activity of lesion in the proximal RIGHT humerus. Interval increase in size and metabolic activity of lesion in the proximal RIGHT femur. New hypermetabolic lesions in the L1 and T11 vertebral bodies. Stable hypermetabolic lesion in the L3 vertebral body. Stable hypermetabolic lesion in the RIGHT chest wall. Stable hypermetabolic lesion in the RIGHT external iliac and inguinal lymph nodes. Stable hypermetabolic lesion in the RIGHT medial thigh. Decrease in metabolic activity of gallbladder lesion.   Current Treatment:  Binimetinib   INTERVAL  HISTORY:   Elizabeth Norman is here today for follow up and to establish care with me for metastatic melanoma. Patient is accompanied by her son today.  She reports doing very well and has no complaints today.  She denies rash, diarrhea, abdominal pain, shortness of breath.  We discussed the PET scan results and that there is some mixed response with new areas and previous areas increasing in size.  We discussed that this is considered as progression and patient would require next line of treatment.  Patient is very motivated to get treatment.  I have reviewed the past medical history, past surgical history, social history and family history with the patient and they are unchanged from previous note.  ALLERGIES:  is allergic to tape.  MEDICATIONS:  Current Outpatient Medications  Medication Sig Dispense Refill   ALPRAZolam  (XANAX ) 0.5 MG tablet Take 0.5 mg by mouth 3 (three) times daily.  2   calcium  carbonate (OSCAL) 1500 (600 Ca) MG TABS tablet Take 600 mg of elemental calcium  by mouth 3 (three) times a week.     cholecalciferol (VITAMIN D) 1000 UNITS tablet Take 1,000 Units by mouth 3 (three) times a week.     Cyanocobalamin (VITAMIN B-12 PO) Take 1 tablet by mouth once a week.     diphenhydramine -acetaminophen  (TYLENOL  PM) 25-500 MG TABS tablet Take 1 tablet by mouth at bedtime as needed.     levothyroxine  (SYNTHROID ) 125 MCG tablet Take 1 tablet (125 mcg total) by mouth daily before breakfast. 30 tablet 3   metoprolol  tartrate (LOPRESSOR ) 25 MG tablet Take 1 tablet (25 mg total) by mouth 2 (two) times daily. Hold until follow-up with your doctor/cardiologist     ondansetron  (ZOFRAN -ODT) 4 MG disintegrating tablet Take 1 tablet (4 mg total) by mouth every 6 (six) hours as needed for nausea. 20 tablet 0   prednisoLONE  acetate (PRED FORTE ) 1 % ophthalmic suspension Place 1 drop into both eyes daily.  No current facility-administered medications for this visit.    REVIEW OF SYSTEMS:    Constitutional: Denies fevers, chills or abnormal weight loss Eyes: Denies blurriness of vision Ears, nose, mouth, throat, and face: Denies mucositis or sore throat Respiratory: Denies cough, dyspnea or wheezes Cardiovascular: Denies palpitation, chest discomfort or lower extremity swelling Gastrointestinal:  Denies nausea, heartburn or change in bowel habits Skin: Denies abnormal skin rashes Lymphatics: Denies new lymphadenopathy or easy bruising Neurological:Denies numbness, tingling or new weaknesses Behavioral/Psych: Mood is stable, no new changes  All other systems were reviewed with the patient and are negative.   VITALS:  There were no vitals taken for this visit.  Wt Readings from Last 3 Encounters:  01/13/24 162 lb 14.7 oz (73.9 kg)  12/23/23 160 lb 6.4 oz (72.8 kg)  12/02/23 162 lb 3.2 oz (73.6 kg)    There is no height or weight on file to calculate BMI.  Performance status (ECOG): 2 - Symptomatic, <50% confined to bed  PHYSICAL EXAM:   GENERAL:alert, no distress and comfortable SKIN: skin color, texture, turgor are normal, no rashes or significant lesions LYMPH: Palpable right inguinal lymphadenopathy, LUNGS: clear to auscultation and percussion with normal breathing effort HEART: regular rate & rhythm and no murmurs and no lower extremity edema ABDOMEN:abdomen soft, non-tender and normal bowel sounds Musculoskeletal: Induration in the right thigh 5 into 3 cm in diameter  NEURO: alert & oriented x 3 with fluent speech, no focal motor/sensory deficits  LABORATORY DATA:  I have reviewed the data as listed   Lab Results  Component Value Date   WBC 3.9 (L) 01/13/2024   NEUTROABS 2.7 01/13/2024   HGB 11.7 (L) 01/13/2024   HCT 37.6 01/13/2024   MCV 90.6 01/13/2024   PLT 148 (L) 01/13/2024      Chemistry      Component Value Date/Time   NA 135 02/03/2024 0852   K 4.4 02/03/2024 0852   CL 97 (L) 02/03/2024 0852   CO2 27 02/03/2024 0852   BUN 12  02/03/2024 0852   CREATININE 0.74 02/03/2024 0852      Component Value Date/Time   CALCIUM  8.9 02/03/2024 0852   ALKPHOS 163 (H) 02/03/2024 0852   AST 25 02/03/2024 0852   ALT 14 02/03/2024 0852   BILITOT 0.7 02/03/2024 0852       RADIOGRAPHIC STUDIES: I have personally reviewed the radiological images as listed and agreed with the findings in the report.  NM PET Image Restage (PS) Whole Body CLINICAL DATA:  Subsequent treatment strategy for malignant melanoma of the RIGHT lower leg. Immunotherapy in progress  EXAM: NUCLEAR MEDICINE PET WHOLE BODY  TECHNIQUE: 8.4 mCi F-18 FDG was injected intravenously. Full-ring PET imaging was performed from the head to foot after the radiotracer. CT data was obtained and used for attenuation correction and anatomic localization.  Fasting blood glucose: 109 mg/dl  COMPARISON:  FDG PET scan 10/15/2023  FINDINGS: HEAD/NECK: No hypermetabolic activity in the scalp. No hypermetabolic cervical lymph nodes.  Incidental CT findings: none  CHEST: Soft tissue chest wall mass superficial to the RIGHT ribs measures 4.6 x 1.9 cm (image 124) not changed in size from prior. Lesion is moderately hypermetabolic with SUV max equal 3.1 compared SUV max equal 2.8. No interval change.  No hypermetabolic pulmonary nodules.  Hypermetabolic lesion in the inter costal space of the LEFT lower chest wall with SUV max equal 6.6 on image 135. There is soft tissue nodule measuring 14 mm. Lesion slightly increased in  size from 10 mm with similar metabolic activity. Similar metabolic activity  There is interval increase in metabolic activity of a lesion in the proximal RIGHT humerus with SUV max equal 4.0 on image 100 compared to 1.7 on comparison exam.  Incidental CT findings: none  ABDOMEN/PELVIS: There is decrease in metabolic activity of hypermetabolic lesion in the gallbladder lesion with SUV max equal 3.4 decreased SUV max equal 6.6.  No  hypermetabolic abdominal lymph nodes.  Persistent hypermetabolic activity in RIGHT external iliac and inguinal nodes. Example large RIGHT inguinal node measuring 2.8 cm short axis with SUV max equal 14 compares to 1.9 cm with SUV max equal 14.1.  Similar intensely radiotracer avid RIGHT external iliac lymph node with SUV max equal 6.1 compared SUV max equal 5. 1.  Incidental CT findings: none  SKELETON: No change in the L 3 vertebral body level with SUV max equal 7.1 compared SUV max 7.1. New hypermetabolic activity in the L1 and T11 vertebral bodies  Incidental CT findings: none  EXTREMITIES: Lesion in the proximal mid femoral diaphysis on the RIGHT with SUV max equal 30 compared SUV max equal 11.5. Soft tissue component within the medullary space is increased in size measuring 19 mm compared to 6 mm.  Soft tissue nodule in the medial RIGHT thigh similar metabolic activity in size SUV max equal 5.3 on image 309.  Incidental CT findings: none  IMPRESSION: 1. Mixed response to therapy. 2. Interval increase in size and metabolic activity of lesion in the proximal RIGHT humerus. 3. Interval increase in size and metabolic activity of lesion in the proximal RIGHT femur. 4. New hypermetabolic lesions in the L1 and T11 vertebral bodies. 5. Stable hypermetabolic lesion in the L3 vertebral body. 6. Stable hypermetabolic lesion in the RIGHT chest wall. 7. Stable hypermetabolic lesion in the RIGHT external iliac and inguinal lymph nodes. 8. Stable hypermetabolic lesion in the RIGHT medial thigh. 9. Decrease in metabolic activity of gallbladder lesion.  Electronically Signed   By: Jackquline Boxer M.D.   On: 02/01/2024 11:06

## 2024-02-03 ENCOUNTER — Inpatient Hospital Stay (HOSPITAL_BASED_OUTPATIENT_CLINIC_OR_DEPARTMENT_OTHER): Admitting: Oncology

## 2024-02-03 ENCOUNTER — Inpatient Hospital Stay

## 2024-02-03 ENCOUNTER — Telehealth: Payer: Self-pay | Admitting: Pharmacy Technician

## 2024-02-03 ENCOUNTER — Telehealth: Payer: Self-pay | Admitting: Pharmacist

## 2024-02-03 ENCOUNTER — Other Ambulatory Visit (HOSPITAL_COMMUNITY): Payer: Self-pay

## 2024-02-03 ENCOUNTER — Encounter: Payer: Self-pay | Admitting: Oncology

## 2024-02-03 ENCOUNTER — Encounter (HOSPITAL_COMMUNITY): Payer: Self-pay | Admitting: Oncology

## 2024-02-03 DIAGNOSIS — D649 Anemia, unspecified: Secondary | ICD-10-CM | POA: Insufficient documentation

## 2024-02-03 DIAGNOSIS — C4371 Malignant melanoma of right lower limb, including hip: Secondary | ICD-10-CM

## 2024-02-03 DIAGNOSIS — Z95828 Presence of other vascular implants and grafts: Secondary | ICD-10-CM

## 2024-02-03 DIAGNOSIS — I514 Myocarditis, unspecified: Secondary | ICD-10-CM | POA: Diagnosis not present

## 2024-02-03 DIAGNOSIS — E039 Hypothyroidism, unspecified: Secondary | ICD-10-CM | POA: Insufficient documentation

## 2024-02-03 DIAGNOSIS — Z79620 Long term (current) use of immunosuppressive biologic: Secondary | ICD-10-CM | POA: Diagnosis not present

## 2024-02-03 DIAGNOSIS — E032 Hypothyroidism due to medicaments and other exogenous substances: Secondary | ICD-10-CM | POA: Diagnosis not present

## 2024-02-03 DIAGNOSIS — Z5112 Encounter for antineoplastic immunotherapy: Secondary | ICD-10-CM | POA: Diagnosis not present

## 2024-02-03 DIAGNOSIS — Z8679 Personal history of other diseases of the circulatory system: Secondary | ICD-10-CM

## 2024-02-03 DIAGNOSIS — C7951 Secondary malignant neoplasm of bone: Secondary | ICD-10-CM | POA: Diagnosis not present

## 2024-02-03 LAB — CBC WITH DIFFERENTIAL/PLATELET
Abs Immature Granulocytes: 0.02 K/uL (ref 0.00–0.07)
Basophils Absolute: 0 K/uL (ref 0.0–0.1)
Basophils Relative: 1 %
Eosinophils Absolute: 0.3 K/uL (ref 0.0–0.5)
Eosinophils Relative: 7 %
HCT: 36.9 % (ref 36.0–46.0)
Hemoglobin: 11.8 g/dL — ABNORMAL LOW (ref 12.0–15.0)
Immature Granulocytes: 1 %
Lymphocytes Relative: 16 %
Lymphs Abs: 0.7 K/uL (ref 0.7–4.0)
MCH: 28.8 pg (ref 26.0–34.0)
MCHC: 32 g/dL (ref 30.0–36.0)
MCV: 90 fL (ref 80.0–100.0)
Monocytes Absolute: 0.4 K/uL (ref 0.1–1.0)
Monocytes Relative: 10 %
Neutro Abs: 2.8 K/uL (ref 1.7–7.7)
Neutrophils Relative %: 65 %
Platelets: 158 K/uL (ref 150–400)
RBC: 4.1 MIL/uL (ref 3.87–5.11)
RDW: 13.9 % (ref 11.5–15.5)
WBC: 4.2 K/uL (ref 4.0–10.5)
nRBC: 0 % (ref 0.0–0.2)

## 2024-02-03 LAB — COMPREHENSIVE METABOLIC PANEL WITH GFR
ALT: 14 U/L (ref 0–44)
AST: 25 U/L (ref 15–41)
Albumin: 3.3 g/dL — ABNORMAL LOW (ref 3.5–5.0)
Alkaline Phosphatase: 163 U/L — ABNORMAL HIGH (ref 38–126)
Anion gap: 11 (ref 5–15)
BUN: 12 mg/dL (ref 8–23)
CO2: 27 mmol/L (ref 22–32)
Calcium: 8.9 mg/dL (ref 8.9–10.3)
Chloride: 97 mmol/L — ABNORMAL LOW (ref 98–111)
Creatinine, Ser: 0.74 mg/dL (ref 0.44–1.00)
GFR, Estimated: >60 mL/min (ref >60)
Glucose, Bld: 110 mg/dL — ABNORMAL HIGH (ref 70–99)
Potassium: 4.4 mmol/L (ref 3.5–5.1)
Sodium: 135 mmol/L (ref 135–145)
Total Bilirubin: 0.7 mg/dL (ref 0.0–1.2)
Total Protein: 6.8 g/dL (ref 6.5–8.1)

## 2024-02-03 LAB — TROPONIN I (HIGH SENSITIVITY): Troponin I (High Sensitivity): 10 ng/L (ref <18)

## 2024-02-03 LAB — TSH: TSH: 0.755 u[IU]/mL (ref 0.350–4.500)

## 2024-02-03 LAB — MAGNESIUM: Magnesium: 2.1 mg/dL (ref 1.7–2.4)

## 2024-02-03 MED ORDER — BINIMETINIB 15 MG PO TABS: 30.0000 mg | ORAL_TABLET | Freq: Two times a day (BID) | ORAL | 2 refills | Status: DC

## 2024-02-03 NOTE — Assessment & Plan Note (Addendum)
 Stable at this time.  - Continue to monitor -Will frequently check iron panel, ferritin, B12 and folate

## 2024-02-03 NOTE — Assessment & Plan Note (Addendum)
 Metastatic malignant melanoma initially diagnosed in 2022.  Extensive oncology history below S/p wide local excision and lymph node biopsy.  Adjuvant therapy with Opdualag  resulting in myocarditis requiring hospital admission and hence was discontinued Received radiation to femoral lesion and inguinal lymph node. NGS consistent with negative BRAF, positive NRAS and positive PTEN Second line: Pembrolizumab  Third line: Ipilimumab  added for progression along with pembrolizumab   -Patient is asymptomatic today - We discussed the PET scan results together and reviewed the images.  Patient has mixed response with increase in size of previous lesions, new lesions at T4 and L1 and stable previous lesions.  Considering that she has been having progressive disease for a while and that now she has new lesions, recommended changing treatment at this time - Discussed with the patient that as we are leaning into fourth line of treatment, the response rates are 20 to 30% -Patient has a positive NRAS mutation that has some response to binimetinib .  Discussed risk versus benefits in detail.Binimetinib  monotherapy often causes gastrointestinal symptoms (nausea, diarrhea, vomiting), fatigue, rash, and muscle-related issues like myalgia or elevated creatine phosphokinase. Serious but less frequent risks include ocular toxicities (e.g. serous retinopathy), liver enzyme elevations, and potential cardiomyopathy or cardiovascular dysfunction. -Will start binimetinib  30 mg twice daily.  Can dose adjust based on her tolerance and response. - Will repeat PET scan in 3 months  Return to clinic in 1 month for assessment of response and tolerance.

## 2024-02-03 NOTE — Telephone Encounter (Signed)
 Clinical Pharmacist Practitioner Encounter   Received new prescription for Mektovi  (binimetinib ) for the treatment of Malignant melanoma of the right posterior leg (pT4 pN3 M1), planned duration until disease progression or unacceptable drug toxicity.  Patient was found to have NRAS Q61R mutation ([facility] testing 04/2021). Per NCCN guidelines binimetinib  is a category 2B recommended agent for NRAS-mutated tumors.   CMP from 02/03/24 assessed, no relevant lab abnormalities. Prescription dose and frequency assessed.   Patient should have ophthalmologic exams at regular intervals, for new or worsening visual disturbances  Monitoring needed: Current medication list in Epic reviewed, no DDIs with binimetinib  identified. Patient had ECHO on 03/31/23 that showed EF of 60 to 65%. She will needed repeat ECHO 1 month after initiating treatment, then every 2 to 3 months during treatment   Evaluated chart and no patient barriers to medication adherence identified.   Prescription has been e-scribed to the [facility] for benefits analysis and approval.  Oral Oncology Clinic will continue to follow for insurance authorization, copayment issues, initial counseling and start date.   Elizabeth Norman N. Brenner Visconti, PharmD, BCOP, CPP Hematology/Oncology Clinical Pharmacist [facility] Oral Chemotherapy Navigation Clinic [phone]  02/03/2024 1:40 PM

## 2024-02-03 NOTE — Assessment & Plan Note (Addendum)
 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

## 2024-02-03 NOTE — Telephone Encounter (Signed)
 Oral Oncology Patient Advocate Encounter   New authorization   Received notification that prior authorization for Mektovi  is required.   PA submitted on CMM via Latent Key [id number] Status is pending     Elizabeth Norman ([name]) [name], CPhT  [facility], [name], [facility]/Oncology - Oral Chemotherapy Patient Advocate Specialist III Phone: [phone]  Fax: [phone]

## 2024-02-03 NOTE — Patient Instructions (Addendum)
 [facility] Discharge Instructions   You were seen and examined today by Dr. Davonna.  She reviewed the results of your lab work which are normal/stable.   She reviewed the results of your PET scan. It is showing a mixed response to therapy.   She discussed with you that because there are multiple spots that would require radiation, she does not believe this would be an option. She gave you the option to continue the treatment you are on now and continue to monitor with a PET scan in two months. She also offered you to take a pill for treatment. This pill will be taken one pill twice a day. This will come from a specialty pharmacy and be delivered to your home.  We will see you back in one month. We will repeat lab work at that time.   Return as scheduled.    Thank you for choosing [facility] at [facility] to provide your oncology and hematology care.  To afford each patient quality time with our provider, please arrive at least 15 minutes before your scheduled appointment time.   If you have a lab appointment with the Cancer Center please come in thru the Main Entrance and check in at the main information desk.  You need to re-schedule your appointment should you arrive 10 or more minutes late.  We strive to give you quality time with our providers, and arriving late affects you and other patients whose appointments are after yours.  Also, if you no show three or more times for appointments you may be dismissed from the clinic at the providers discretion.     Again, thank you for choosing [facility].  Our hope is that these requests will decrease the amount of time that you wait before being seen by our physicians.       _____________________________________________________________  Should you have questions after your visit to [facility], please contact our office at [phone] and follow the  prompts.  Our office hours are 8:00 a.m. and 4:30 p.m. Monday - Friday.  Please note that voicemails left after 4:00 p.m. may not be returned until the following business day.  We are closed weekends and major holidays.  You do have access to a nurse 24-7, just call the main number to the clinic [phone] and do not press any options, hold on the line and a nurse will answer the phone.    For prescription refill requests, have your pharmacy contact our office and allow 72 hours.    Due to Covid, you will need to wear a mask upon entering the hospital. If you do not have a mask, a mask will be given to you at the Main Entrance upon arrival. For doctor visits, patients may have 1 support person age 16 or older with them. For treatment visits, patients can not have anyone with them due to social distancing guidelines and our immunocompromised population.

## 2024-02-03 NOTE — Assessment & Plan Note (Signed)
 Likely secondary to immunotherapy On levothyroxine  50 mcg daily Recent TSH on labs was normal  - Continue levothyroxine  50 mcg daily

## 2024-02-04 ENCOUNTER — Other Ambulatory Visit: Payer: Self-pay

## 2024-02-04 ENCOUNTER — Other Ambulatory Visit (HOSPITAL_COMMUNITY): Payer: Self-pay

## 2024-02-04 ENCOUNTER — Encounter (HOSPITAL_COMMUNITY): Payer: Self-pay | Admitting: Oncology

## 2024-02-04 LAB — T4: T4, Total: 9.1 ug/dL (ref 4.5–12.0)

## 2024-02-04 NOTE — Telephone Encounter (Signed)
 Oral Oncology Patient Advocate Encounter  Prior Authorization for Mektovi  has been approved.    PA# [id number] Effective dates: 02/04/2024 through 05/11/2024  Patients co-pay is $1,749.10.    Devanee Pomplun ([name]) [name], CPhT  [facility], [name], [facility] - Oral Chemotherapy Patient Advocate Specialist III Phone: [phone]  Fax: [phone]

## 2024-02-05 ENCOUNTER — Other Ambulatory Visit: Payer: Self-pay

## 2024-02-05 ENCOUNTER — Telehealth: Payer: Self-pay | Admitting: Pharmacy Technician

## 2024-02-05 ENCOUNTER — Other Ambulatory Visit (HOSPITAL_COMMUNITY): Payer: Self-pay

## 2024-02-05 ENCOUNTER — Other Ambulatory Visit: Payer: Self-pay | Admitting: Pharmacy Technician

## 2024-02-05 ENCOUNTER — Encounter (HOSPITAL_COMMUNITY): Payer: Self-pay | Admitting: Oncology

## 2024-02-05 MED ORDER — BINIMETINIB 15 MG PO TABS
30.0000 mg | ORAL_TABLET | Freq: Two times a day (BID) | ORAL | 0 refills | Status: DC
  Filled 2024-02-05 (×2): qty 120, 30d supply, fill #0

## 2024-02-05 NOTE — Progress Notes (Signed)
 Specialty Pharmacy Initial Fill Coordination Note  Elizabeth Norman is a 83 y.o. female contacted today regarding refills of specialty medication(s) Binimetinib  (MEKTOVI ) .  Patient requested Delivery  on 02/09/24  to verified address [address] [place] [address]   Medication will be filled on 09/29.   Patient is aware of $0 copayment. Bill 30 day voucher.  Lenardo Westwood ([name]) [name], CPhT  [facility], [name], [facility] - Oral Chemotherapy Patient Advocate Specialist III Phone: [phone]  Fax: [phone]

## 2024-02-05 NOTE — Progress Notes (Signed)
 Patient education documented in EPIC note on 02/05/24.

## 2024-02-05 NOTE — Telephone Encounter (Signed)
 Oral Oncology Patient Advocate Encounter   Was successful in enrolling patient in free trial program for Mektovi .   This will allow a one time 30 day fill of medication at no charge.  The billing information is as follows and has been shared with [facility].   RxBin: [id number] PCN: [id number] Member ID: [id number] Group ID: [id number]  Elizabeth Norman ([name]) [name], CPhT  [name], [name], [facility] - Oral Chemotherapy Patient Advocate Specialist III Phone: [phone]  Fax: [phone]

## 2024-02-05 NOTE — Telephone Encounter (Signed)
 Patient successfully OnBoarded and drug education provided by pharmacist. Medication scheduled to be shipped on 09/29 for delivery on 09/30 from [facility] to patient's address. Patient also knows to call me at [phone] with any questions or concerns regarding receiving medication or if there is any unexpected change in co-pay.   Elizabeth Norman (Elizabeth) [name], CPhT  [facility], [name], [facility] - Oral Chemotherapy Patient Advocate Specialist III Phone: [phone]  Fax: [phone]

## 2024-02-05 NOTE — Telephone Encounter (Signed)
 Clinical Pharmacist Practitioner Encounter   [facility] (Specialty) will deliver medication to patient on 02/09/24.  Patient knows to start once they have medication in hand.   Patient Education I spoke with patient for overview of new oral chemotherapy medication: Mektovi  (binimetinib ) for the treatment of Malignant melanoma of the right posterior leg (pT4 pN3 M1), planned duration until disease progression or unacceptable drug toxicity.   Patient was found to have NRAS Q61R mutation ([facility] testing 04/2021). Per NCCN guidelines binimetinib  is a category 2B recommended agent for NRAS-mutated tumors.   Treatment goal: Palliative  Counseled patient on administration, dosing, side effects, monitoring, drug-food interactions, safe handling, storage, and disposal. Patient will take 2 tablets (30 mg total) by mouth 2 (two) times daily.   Side effects include but not limited to: fatigue, nausea, diarrhea.   Diarrhea: patient knows to use loperamide as needed and call the office if they are having four or more loose stool per day  Reviewed with patient importance of keeping a medication schedule and plan for any missed doses.  After discussion with patient no patient barriers to medication adherence identified.   Distress evaluation: Distress thermometer not completed during telephone call as patient has been on previous lines of therapy.   Communication and Learning Assessment Primary learner: patient Barriers to learning: No barriers Preferred language: English Learning preferences: Listening Reading  Ms. Furuta voiced understanding and appreciation. All questions answered. Medication handout provided.  Provided patient with Oral Chemotherapy Navigation Clinic phone number. Patient knows to call the office with questions or concerns. Oral Chemotherapy Navigation Clinic will continue to follow.  Sharae Zappulla N. Reuel Lamadrid, PharmD, BCOP, CPP Hematology/Oncology Clinical  Pharmacist [facility] Oral Chemotherapy Navigation Clinic [phone]  02/05/2024 3:55 PM

## 2024-02-09 ENCOUNTER — Encounter: Payer: Self-pay | Admitting: Hematology

## 2024-02-09 ENCOUNTER — Other Ambulatory Visit (HOSPITAL_COMMUNITY): Payer: Self-pay

## 2024-02-09 ENCOUNTER — Other Ambulatory Visit: Payer: Self-pay

## 2024-02-09 MED ORDER — PROCHLORPERAZINE MALEATE 10 MG PO TABS: 10.0000 mg | ORAL_TABLET | Freq: Four times a day (QID) | ORAL | 3 refills | Status: AC | PRN

## 2024-02-10 ENCOUNTER — Other Ambulatory Visit: Payer: Self-pay

## 2024-02-10 ENCOUNTER — Other Ambulatory Visit (HOSPITAL_COMMUNITY): Payer: Self-pay

## 2024-02-11 DIAGNOSIS — I10 Essential (primary) hypertension: Secondary | ICD-10-CM | POA: Diagnosis not present

## 2024-02-11 DIAGNOSIS — N182 Chronic kidney disease, stage 2 (mild): Secondary | ICD-10-CM | POA: Diagnosis not present

## 2024-02-24 ENCOUNTER — Inpatient Hospital Stay

## 2024-02-24 ENCOUNTER — Inpatient Hospital Stay: Attending: Hematology | Admitting: Physician Assistant

## 2024-02-24 ENCOUNTER — Inpatient Hospital Stay: Admitting: Oncology

## 2024-02-24 ENCOUNTER — Other Ambulatory Visit: Payer: Self-pay

## 2024-02-24 ENCOUNTER — Other Ambulatory Visit: Payer: Self-pay | Admitting: Physician Assistant

## 2024-02-24 VITALS — BP 138/76 | HR 73 | Temp 97.6°F | Resp 18 | Wt 173.0 lb

## 2024-02-24 DIAGNOSIS — D649 Anemia, unspecified: Secondary | ICD-10-CM | POA: Diagnosis not present

## 2024-02-24 DIAGNOSIS — E032 Hypothyroidism due to medicaments and other exogenous substances: Secondary | ICD-10-CM | POA: Insufficient documentation

## 2024-02-24 DIAGNOSIS — R21 Rash and other nonspecific skin eruption: Secondary | ICD-10-CM | POA: Insufficient documentation

## 2024-02-24 DIAGNOSIS — B3731 Acute candidiasis of vulva and vagina: Secondary | ICD-10-CM

## 2024-02-24 DIAGNOSIS — R300 Dysuria: Secondary | ICD-10-CM | POA: Insufficient documentation

## 2024-02-24 DIAGNOSIS — Z79890 Hormone replacement therapy (postmenopausal): Secondary | ICD-10-CM | POA: Diagnosis not present

## 2024-02-24 DIAGNOSIS — R252 Cramp and spasm: Secondary | ICD-10-CM | POA: Insufficient documentation

## 2024-02-24 DIAGNOSIS — R7989 Other specified abnormal findings of blood chemistry: Secondary | ICD-10-CM | POA: Insufficient documentation

## 2024-02-24 DIAGNOSIS — Z2989 Encounter for other specified prophylactic measures: Secondary | ICD-10-CM

## 2024-02-24 DIAGNOSIS — C4371 Malignant melanoma of right lower limb, including hip: Secondary | ICD-10-CM | POA: Insufficient documentation

## 2024-02-24 DIAGNOSIS — Z8679 Personal history of other diseases of the circulatory system: Secondary | ICD-10-CM | POA: Insufficient documentation

## 2024-02-24 DIAGNOSIS — Z923 Personal history of irradiation: Secondary | ICD-10-CM | POA: Diagnosis not present

## 2024-02-24 DIAGNOSIS — R600 Localized edema: Secondary | ICD-10-CM | POA: Insufficient documentation

## 2024-02-24 LAB — COMPREHENSIVE METABOLIC PANEL WITH GFR
ALT: 19 U/L (ref 0–44)
AST: 53 U/L — ABNORMAL HIGH (ref 15–41)
Albumin: 3.4 g/dL — ABNORMAL LOW (ref 3.5–5.0)
Alkaline Phosphatase: 155 U/L — ABNORMAL HIGH (ref 38–126)
Anion gap: 8 (ref 5–15)
BUN: 16 mg/dL (ref 8–23)
CO2: 29 mmol/L (ref 22–32)
Calcium: 9 mg/dL (ref 8.9–10.3)
Chloride: 101 mmol/L (ref 98–111)
Creatinine, Ser: 1.06 mg/dL — ABNORMAL HIGH (ref 0.44–1.00)
GFR, Estimated: 52 mL/min — ABNORMAL LOW (ref >60)
Glucose, Bld: 104 mg/dL — ABNORMAL HIGH (ref 70–99)
Potassium: 4.9 mmol/L (ref 3.5–5.1)
Sodium: 138 mmol/L (ref 135–145)
Total Bilirubin: 0.3 mg/dL (ref 0.0–1.2)
Total Protein: 6.1 g/dL — ABNORMAL LOW (ref 6.5–8.1)

## 2024-02-24 LAB — CBC WITH DIFFERENTIAL/PLATELET
Abs Immature Granulocytes: 0.02 K/uL (ref 0.00–0.07)
Basophils Absolute: 0 K/uL (ref 0.0–0.1)
Basophils Relative: 0 %
Eosinophils Absolute: 0.2 K/uL (ref 0.0–0.5)
Eosinophils Relative: 3 %
HCT: 38.4 % (ref 36.0–46.0)
Hemoglobin: 11.9 g/dL — ABNORMAL LOW (ref 12.0–15.0)
Immature Granulocytes: 0 %
Lymphocytes Relative: 26 %
Lymphs Abs: 1.4 K/uL (ref 0.7–4.0)
MCH: 28.1 pg (ref 26.0–34.0)
MCHC: 31 g/dL (ref 30.0–36.0)
MCV: 90.8 fL (ref 80.0–100.0)
Monocytes Absolute: 0.4 K/uL (ref 0.1–1.0)
Monocytes Relative: 7 %
Neutro Abs: 3.4 K/uL (ref 1.7–7.7)
Neutrophils Relative %: 64 %
Platelets: 138 K/uL — ABNORMAL LOW (ref 150–400)
RBC: 4.23 MIL/uL (ref 3.87–5.11)
RDW: 14.1 % (ref 11.5–15.5)
WBC: 5.4 K/uL (ref 4.0–10.5)
nRBC: 0 % (ref 0.0–0.2)

## 2024-02-24 LAB — TROPONIN T, HIGH SENSITIVITY: Troponin T High Sensitivity: 25 ng/L — ABNORMAL HIGH (ref 0–19)

## 2024-02-24 LAB — URINALYSIS, ROUTINE W REFLEX MICROSCOPIC
Bilirubin Urine: NEGATIVE
Glucose, UA: NEGATIVE mg/dL
Hgb urine dipstick: NEGATIVE
Ketones, ur: NEGATIVE mg/dL
Nitrite: NEGATIVE
Protein, ur: NEGATIVE mg/dL
RBC / HPF: >50 RBC/hpf (ref 0–5)
Specific Gravity, Urine: 1.024 (ref 1.005–1.030)
pH: 5 (ref 5.0–8.0)

## 2024-02-24 LAB — PRO BRAIN NATRIURETIC PEPTIDE: Pro Brain Natriuretic Peptide: 351 pg/mL — ABNORMAL HIGH (ref <300.0)

## 2024-02-24 LAB — MAGNESIUM: Magnesium: 1.9 mg/dL (ref 1.7–2.4)

## 2024-02-24 MED ORDER — FLUCONAZOLE 150 MG PO TABS: 150.0000 mg | ORAL_TABLET | Freq: Once | ORAL | 0 refills | Status: AC

## 2024-02-24 MED ORDER — FUROSEMIDE 20 MG PO TABS: 20.0000 mg | ORAL_TABLET | Freq: Every day | ORAL | 0 refills | Status: DC

## 2024-02-24 NOTE — Patient Instructions (Signed)
 Elizabeth Norman **VISIT SUMMARY & IMPORTANT INSTRUCTIONS **   You were seen today by Pleasant Barefoot PA-C for your symptom management visit.    VAGINAL IRRITATION I suspect that you have a vaginal yeast infection. I will prescribe fluconazole (Diflucan) 150 mg pill x 1 dose. You do not need to continue using Monistat, since the Diflucan should treat the yeast infection. If you continue to have symptoms after 48 to 72 hours, please call to let us  know. Continue to use Desitin as needed as barrier cream, and keep your vaginal area dry.  Do not overuse cleansing agents or douches, as this can cause imbalance between bacteria and yeast.  SWELLING Take Lasix (furosemide) 20 mg once a day for the next 5 days. Make sure that you continue to drink plenty of water  so that the Lasix does not dehydrate you.  FOLLOW-UP APPOINTMENT: Follow-up with Dr. Davonna as scheduled on 03/04/2024.  ** Thank you for trusting me with your healthcare!  I strive to provide all of my patients with quality care at each visit.  If you receive a survey for this visit, I would be so grateful to you for taking the time to provide feedback.  Thank you in advance!  ~ Adrien Shankar                                        Dr. Mickiel Davonna Pleasant Barefoot, PA-C          Delon Hope, NP   - - - - - - - - - - - - - - - - - -    Thank you for choosing  [facility] at [facility] to provide your oncology and hematology care.  To afford each patient quality time with our provider, please arrive at least 15 minutes before your scheduled appointment time.   If you have a lab appointment with the Cancer Center please come in thru the Main Entrance and check in at the main information desk.  You need to re-schedule your appointment should you arrive 10 or more minutes late.  We strive to give you quality time with our providers, and arriving late affects you and other  patients whose appointments are after yours.  Also, if you no show three or more times for appointments you may be dismissed from the clinic at the providers discretion.     Again, thank you for choosing [facility].  Our hope is that these requests will decrease the amount of time that you wait before being seen by our physicians.       _____________________________________________________________  Should you have questions after your visit to [facility], please contact our office at [phone] and follow the prompts.  Our office hours are 8:00 a.m. and 4:30 p.m. Monday - Friday.  Please note that voicemails left after 4:00 p.m. may not be returned until the following business day.  We are closed weekends and major holidays.  You do have access to a nurse 24-7, just call the main number to the clinic [phone] and do not press any options, hold on the line and a nurse will answer the phone.    For prescription refill requests, have your pharmacy contact our office and allow 72 hours.

## 2024-02-24 NOTE — Progress Notes (Addendum)
 [facility] [address] [place] [address] Phone: [phone] Fax: [phone]  SYMPTOM MANAGEMENT CLINIC PROGRESS NOTE   Elizabeth Norman [id number] 1940/06/21 83 y.o.  Elizabeth Norman is managed by Dr. Davonna for metastatic malignant melanoma of her lower right leg  Actively treated with chemotherapy/immunotherapy/hormonal therapy: YES  Current therapy: Binimetinib  (Mektovi ) 30 mg twice daily  Last treated: Intermittent 30 mg twice daily started on 02/03/2024  INTERVAL HISTORY:  Chief Complaint: Hands and legs swelling, having burning on her labia  NURSE REPORT:  Patient has been on Binimetinib  (MEKTOVI ), for malignant melanoma.  She reported that on Sunday her private area was burning , not when she urinated but like when she pees it make this cut or place on her labia or somewhere down there burns. On Monday, she said her feet and legs and hands were swelling, she had to take her rings off.   >> PRE-APPOINTMENT LABS: CBC/D, CMP, magnesium , troponin, BNP, urinalysis + urine culture  Elizabeth Norman presents today due to complaints of extremity swelling and burning with urination.    Patient reports onset of burning in her labia on Sunday, 02/21/2024.  She reports that she has leaky bladder at baseline and wears a pad and uses Desitin as barrier cream.  Since Sunday, she has had burning pain in her labia anytime that she urinates.  She denies any urethral burning, frequency, urgency, or suprapubic/pelvic pain.  She has not noticed any vaginal discharge.  She states that she has never had a UTI.  She says this feels similar to previous yeast infections.  She has been using Monistat since Monday, which has improved some of her symptoms.  Over the past week, she has noticed increasing swelling in her upper and lower extremities.  She reports that she was unable to wear rings on her finger this past Sunday.  Weight today shows that she has gained 10  pounds in the past 3 weeks.  She reports good urine output.  She drinks 64 ounces of water  daily.  She denies any chest pain or dyspnea on exertion.  ASSESSMENT & PLAN:  ## Metastatic malignant melanoma right lower leg - Primary medical oncologist is Dr. Davonna - Patient has progressed on 1st, 2nd, and 3rd line therapy - Start on fourth line therapy (binimetinib  30 mg twice daily) on 02/03/2024 - Patient was informed that binimetinib  monotherapy often causes gastrointestinal symptoms (nausea, diarrhea, vomiting), fatigue, rash, and muscle related issues like myalgia or elevated creatinine phosphokinase.  Serious but less frequent risks include ocular toxicities (e.g. serious retinopathy), liver enzyme elevations, and potential cardiomyopathy or cardiovascular dysfunction. - PLAN: Continue 1 month follow-up with Dr. Davonna, scheduled on 02/13/2024.  Planning for repeat PET scan in 3 months.  # DYSURIA - History as above - Erythema and irritation of labia and vulva seen on exam. - Suspect vulvar candidiasis. - PLAN: Rx to pharmacy for Diflucan 150 mg x 1 tablet.  (No known interactions with her immunotherapy medication.)  # EXTREMITY SWELLING - History as above - Labs today showed slightly increased creatinine 1.06 (baseline creatinine 0.7-0.8).  Troponin T mildly elevated at 25, proBNP mildly elevated at 351 (upper limit normal 300).  Electrolytes normal. - 1-2+ pitting edema of bilateral lower extremities from ankles to thighs noted on exam with trace edema of upper extremities. - Weight today is up 10 pounds from her weight 3 weeks ago. - PLAN: Rx to pharmacy for Lasix 20 mg daily x  5 days.  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  LV has no regional wall motion abnormalities.  Mild LVH of basal septal segment.  Grade 1 diastolic dysfunction. - PLAN: Caution with immunotherapy.  # OTHER PMH - Arthritis, hypertension    PLAN SUMMARY: >> Rx to pharmacy for Diflucan and Lasix >> Next scheduled appointment with medical oncologist: Friday, 03/04/2024   REVIEW OF SYSTEMS:   Review of Systems  Constitutional:  Negative for activity change, appetite change, chills, diaphoresis, fatigue, fever and unexpected weight change.  HENT:  Negative for mouth sores, nosebleeds, sore throat and trouble swallowing.   Respiratory:  Positive for cough (residual cough frm bronchitis that was treated in August 2025). Negative for shortness of breath.   Cardiovascular:  Positive for leg swelling. Negative for chest pain and palpitations.  Gastrointestinal:  Negative for abdominal pain, blood in stool, constipation, diarrhea, nausea and vomiting.  Genitourinary:  Positive for dysuria (burning on labia after urinating). Negative for decreased urine volume, frequency, hematuria, pelvic pain, urgency and vaginal discharge.  Neurological:  Negative for dizziness, light-headedness, numbness and headaches.  Psychiatric/Behavioral:  Negative for dysphoric mood and sleep disturbance. The patient is not nervous/anxious.    Past Medical History, Surgical history, Social history, and Family history were reviewed as documented elsewhere in chart, and were updated as appropriate.   OBJECTIVE:  Physical Exam:  BP 138/76 (BP Location: Right Arm, Patient Position: Sitting)   Pulse 73   Temp 97.6 F (36.4 C) (Tympanic)   Resp 18   Wt 173 lb (78.5 kg)   SpO2 100%   BMI 31.64 kg/m  ECOG: 1 (symptomatic but completely ambulatory)  Physical Exam Exam conducted with a chaperone present Randa Conrad LPN present during gynecologic  examination).  Constitutional:      Appearance: Normal appearance. She is normal weight.  Cardiovascular:     Heart sounds: Normal heart sounds.  Pulmonary:     Breath sounds: Normal breath sounds.  Genitourinary:    Comments: Vulvar and labial erythema, without discrete lesions.  Scant whitish paste present, unclear if this is vaginal discharge or residual Desitin or Monistat cream applied by patient. Musculoskeletal:        General: Swelling (lower extremity edema as below, with trace upper extremity edema as well) present.     Right lower leg: Edema (1-2+ edema from ankle to thigh) present.     Left lower leg: Edema (1-2+ edema from ankle to thigh) present.  Neurological:     General: No focal deficit present.     Mental Status: Mental status is at baseline.  Psychiatric:        Behavior: Behavior normal. Behavior is cooperative.    Lab Review:     Component Value Date/Time   NA 138 02/24/2024 0922   K 4.9 02/24/2024 0922   CL 101 02/24/2024 0922   CO2 29 02/24/2024 0922   GLUCOSE 104 (H) 02/24/2024 0922   BUN 16 02/24/2024 0922   CREATININE 1.06 (H) 02/24/2024 0922   CALCIUM  9.0 02/24/2024 0922   PROT 6.1 (L) 02/24/2024 0922   ALBUMIN 3.4 (L) 02/24/2024 0922   AST 53 (H) 02/24/2024 9077  ALT 19 02/24/2024 0922   ALKPHOS 155 (H) 02/24/2024 0922   BILITOT 0.3 02/24/2024 0922   GFRNONAA 52 (L) 02/24/2024 0922   GFRAA >60 09/29/2014 1237       Component Value Date/Time   WBC 5.4 02/24/2024 0922   RBC 4.23 02/24/2024 0922   HGB 11.9 (L) 02/24/2024 0922   HCT 38.4 02/24/2024 0922   PLT 138 (L) 02/24/2024 0922   MCV 90.8 02/24/2024 0922   MCH 28.1 02/24/2024 0922   MCHC 31.0 02/24/2024 0922   RDW 14.1 02/24/2024 0922   LYMPHSABS 1.4 02/24/2024 0922   MONOABS 0.4 02/24/2024 0922   EOSABS 0.2 02/24/2024 0922   BASOSABS 0.0 02/24/2024 0922   -------------------------------  Imaging from last 24 hours (if applicable): Radiology interpretation: NM PET Image  Restage (PS) Whole Body Result Date: 02/01/2024 CLINICAL DATA:  Subsequent treatment strategy for malignant melanoma of the RIGHT lower leg. Immunotherapy in progress EXAM: NUCLEAR MEDICINE PET WHOLE BODY TECHNIQUE: 8.4 mCi F-18 FDG was injected intravenously. Full-ring PET imaging was performed from the head to foot after the radiotracer. CT data was obtained and used for attenuation correction and anatomic localization. Fasting blood glucose: 109 mg/dl COMPARISON:  FDG PET scan 10/15/2023 FINDINGS: HEAD/NECK: No hypermetabolic activity in the scalp. No hypermetabolic cervical lymph nodes. Incidental CT findings: none CHEST: Soft tissue chest wall mass superficial to the RIGHT ribs measures 4.6 x 1.9 cm (image 124) not changed in size from prior. Lesion is moderately hypermetabolic with SUV max equal 3.1 compared SUV max equal 2.8. No interval change. No hypermetabolic pulmonary nodules. Hypermetabolic lesion in the inter costal space of the LEFT lower chest wall with SUV max equal 6.6 on image 135. There is soft tissue nodule measuring 14 mm. Lesion slightly increased in size from 10 mm with similar metabolic activity. Similar metabolic activity There is interval increase in metabolic activity of a lesion in the proximal RIGHT humerus with SUV max equal 4.0 on image 100 compared to 1.7 on comparison exam. Incidental CT findings: none ABDOMEN/PELVIS: There is decrease in metabolic activity of hypermetabolic lesion in the gallbladder lesion with SUV max equal 3.4 decreased SUV max equal 6.6. No hypermetabolic abdominal lymph nodes. Persistent hypermetabolic activity in RIGHT external iliac and inguinal nodes. Example large RIGHT inguinal node measuring 2.8 cm short axis with SUV max equal 14 compares to 1.9 cm with SUV max equal 14.1. Similar intensely radiotracer avid RIGHT external iliac lymph node with SUV max equal 6.1 compared SUV max equal 5. 1. Incidental CT findings: none SKELETON: No change in the L 3  vertebral body level with SUV max equal 7.1 compared SUV max 7.1. New hypermetabolic activity in the L1 and T11 vertebral bodies Incidental CT findings: none EXTREMITIES: Lesion in the proximal mid femoral diaphysis on the RIGHT with SUV max equal 30 compared SUV max equal 11.5. Soft tissue component within the medullary space is increased in size measuring 19 mm compared to 6 mm. Soft tissue nodule in the medial RIGHT thigh similar metabolic activity in size SUV max equal 5.3 on image 309. Incidental CT findings: none IMPRESSION: 1. Mixed response to therapy. 2. Interval increase in size and metabolic activity of lesion in the proximal RIGHT humerus. 3. Interval increase in size and metabolic activity of lesion in the proximal RIGHT femur. 4. New hypermetabolic lesions in the L1 and T11 vertebral bodies. 5. Stable hypermetabolic lesion in the L3 vertebral body. 6. Stable hypermetabolic lesion in the RIGHT chest wall. 7. Stable  hypermetabolic lesion in the RIGHT external iliac and inguinal lymph nodes. 8. Stable hypermetabolic lesion in the RIGHT medial thigh. 9. Decrease in metabolic activity of gallbladder lesion. Electronically Signed   By: Jackquline Boxer M.D.   On: 02/01/2024 11:06      WRAP UP:  All questions were answered. The patient knows to call the clinic with any problems, questions or concerns.  Medical decision making: Moderate  Time spent on visit: I spent 30 minutes counseling the patient face to face. The total time spent in the appointment was 40 minutes and more than 50% was on counseling.  Pleasant CHRISTELLA Barefoot, PA-C  02/24/24 12:07 PM

## 2024-02-24 NOTE — Progress Notes (Signed)
>>   PRE-APPOINTMENT LABS: CBC/D, CMP, magnesium , troponin, BNP, urinalysis + urine culture

## 2024-02-25 ENCOUNTER — Other Ambulatory Visit: Payer: Self-pay

## 2024-02-25 LAB — URINE CULTURE: Culture: 40000 — AB

## 2024-02-26 ENCOUNTER — Ambulatory Visit: Payer: Self-pay | Admitting: Physician Assistant

## 2024-02-26 ENCOUNTER — Encounter (HOSPITAL_COMMUNITY): Payer: Self-pay | Admitting: Oncology

## 2024-02-26 NOTE — Progress Notes (Signed)
 Called patient and she is aware of results and agreeable with plan to continue with treatment for vulvar candidiasis per office visit on 02/24/2024.

## 2024-02-26 NOTE — Progress Notes (Signed)
 Error in charting.

## 2024-02-29 ENCOUNTER — Other Ambulatory Visit: Payer: Self-pay

## 2024-02-29 ENCOUNTER — Telehealth: Payer: Self-pay | Admitting: Pharmacy Technician

## 2024-02-29 ENCOUNTER — Other Ambulatory Visit (HOSPITAL_COMMUNITY): Payer: Self-pay

## 2024-02-29 ENCOUNTER — Encounter (HOSPITAL_COMMUNITY): Payer: Self-pay | Admitting: Oncology

## 2024-02-29 NOTE — Telephone Encounter (Signed)
 Oral Oncology Patient Advocate Encounter   Submitted application for assistance for Mektovi  to [employer].   Application submitted via e-fax to [phone]   [employer] [facility] phone number [phone].   I will continue to check the status until final determination.   Beverly Suriano ([name]) [name], CPhT  [facility], [name], [facility] - Oral Chemotherapy Patient Advocate Specialist III Phone: [phone]  Fax: [phone]

## 2024-02-29 NOTE — Progress Notes (Signed)
 Specialty Pharmacy Ongoing Clinical Assessment Note  Elizabeth Norman is a 83 y.o. female who is being followed by the specialty pharmacy service for RxSp Oncology   Patient's specialty medication(s) reviewed today: Binimetinib  (MEKTOVI )   Missed doses in the last 4 weeks: 0   Patient/Caregiver did not have any additional questions or concerns.   Therapeutic benefit summary: Unable to assess   Adverse events/side effects summary: No adverse events/side effects   Patient's therapy is appropriate to: Continue    Goals Addressed             This Visit's Progress    Slow Disease Progression       Patient is unable to be assessed as therapy was recently initiated. Patient will maintain adherence         Follow up: 3 months  [facility] Specialty Pharmacist

## 2024-02-29 NOTE — Telephone Encounter (Signed)
 Oral Oncology Patient Advocate Encounter   Began application for assistance for Mektovi  through [employer].   Application will be submitted upon completion of necessary supporting documentation.   [employer] phone number [phone].   I had been waiting for patient or patient's daughter-in-law, Grayce, to let me know once the patient had signed up for the Medicare Prescription Payment Plan so that the patient could qualify for assistance.   The patient has now been enrolled in the [facility] Prescription Payment Plan.  I am waiting on patient signatures and MD signatures.  Application has been sent to the patient via [employer] to collect her signatures.  Application has been sent via email to Joesph Bohr, RN, to collect Dr. Armanda signatures.   Elowen Debruyn ([name]) [name], CPhT  [facility], [name], [facility] - Oral Chemotherapy Patient Advocate Specialist III Phone: [phone]  Fax: [phone]

## 2024-03-01 ENCOUNTER — Other Ambulatory Visit (HOSPITAL_COMMUNITY): Payer: Self-pay

## 2024-03-02 ENCOUNTER — Other Ambulatory Visit: Payer: Self-pay | Admitting: Oncology

## 2024-03-02 ENCOUNTER — Other Ambulatory Visit (HOSPITAL_COMMUNITY): Payer: Self-pay

## 2024-03-02 ENCOUNTER — Other Ambulatory Visit: Payer: Self-pay

## 2024-03-02 DIAGNOSIS — C4371 Malignant melanoma of right lower limb, including hip: Secondary | ICD-10-CM

## 2024-03-04 ENCOUNTER — Inpatient Hospital Stay (HOSPITAL_BASED_OUTPATIENT_CLINIC_OR_DEPARTMENT_OTHER): Admitting: Oncology

## 2024-03-04 ENCOUNTER — Other Ambulatory Visit: Payer: Self-pay

## 2024-03-04 ENCOUNTER — Other Ambulatory Visit: Payer: Self-pay | Admitting: Pharmacy Technician

## 2024-03-04 ENCOUNTER — Inpatient Hospital Stay

## 2024-03-04 ENCOUNTER — Other Ambulatory Visit (HOSPITAL_COMMUNITY): Payer: Self-pay

## 2024-03-04 ENCOUNTER — Encounter (HOSPITAL_COMMUNITY): Payer: Self-pay | Admitting: Oncology

## 2024-03-04 VITALS — BP 119/64 | HR 87 | Temp 98.1°F | Resp 18 | Wt 171.0 lb

## 2024-03-04 DIAGNOSIS — R21 Rash and other nonspecific skin eruption: Secondary | ICD-10-CM

## 2024-03-04 DIAGNOSIS — D649 Anemia, unspecified: Secondary | ICD-10-CM

## 2024-03-04 DIAGNOSIS — E039 Hypothyroidism, unspecified: Secondary | ICD-10-CM

## 2024-03-04 DIAGNOSIS — C4371 Malignant melanoma of right lower limb, including hip: Secondary | ICD-10-CM

## 2024-03-04 DIAGNOSIS — I514 Myocarditis, unspecified: Secondary | ICD-10-CM

## 2024-03-04 DIAGNOSIS — R252 Cramp and spasm: Secondary | ICD-10-CM | POA: Diagnosis not present

## 2024-03-04 LAB — CBC WITH DIFFERENTIAL/PLATELET
Abs Immature Granulocytes: 0.01 K/uL (ref 0.00–0.07)
Basophils Absolute: 0 K/uL (ref 0.0–0.1)
Basophils Relative: 1 %
Eosinophils Absolute: 0.2 K/uL (ref 0.0–0.5)
Eosinophils Relative: 4 %
HCT: 37.8 % (ref 36.0–46.0)
Hemoglobin: 12 g/dL (ref 12.0–15.0)
Immature Granulocytes: 0 %
Lymphocytes Relative: 28 %
Lymphs Abs: 1.3 K/uL (ref 0.7–4.0)
MCH: 28.1 pg (ref 26.0–34.0)
MCHC: 31.7 g/dL (ref 30.0–36.0)
MCV: 88.5 fL (ref 80.0–100.0)
Monocytes Absolute: 0.4 K/uL (ref 0.1–1.0)
Monocytes Relative: 8 %
Neutro Abs: 2.8 K/uL (ref 1.7–7.7)
Neutrophils Relative %: 59 %
Platelets: 158 K/uL (ref 150–400)
RBC: 4.27 MIL/uL (ref 3.87–5.11)
RDW: 14.7 % (ref 11.5–15.5)
WBC: 4.8 K/uL (ref 4.0–10.5)
nRBC: 0 % (ref 0.0–0.2)

## 2024-03-04 LAB — COMPREHENSIVE METABOLIC PANEL WITH GFR
ALT: 25 U/L (ref 0–44)
AST: 61 U/L — ABNORMAL HIGH (ref 15–41)
Albumin: 3.5 g/dL (ref 3.5–5.0)
Alkaline Phosphatase: 173 U/L — ABNORMAL HIGH (ref 38–126)
Anion gap: 9 (ref 5–15)
BUN: 13 mg/dL (ref 8–23)
CO2: 28 mmol/L (ref 22–32)
Calcium: 8.4 mg/dL — ABNORMAL LOW (ref 8.9–10.3)
Chloride: 98 mmol/L (ref 98–111)
Creatinine, Ser: 0.81 mg/dL (ref 0.44–1.00)
GFR, Estimated: >60 mL/min (ref >60)
Glucose, Bld: 106 mg/dL — ABNORMAL HIGH (ref 70–99)
Potassium: 4.3 mmol/L (ref 3.5–5.1)
Sodium: 134 mmol/L — ABNORMAL LOW (ref 135–145)
Total Bilirubin: 0.4 mg/dL (ref 0.0–1.2)
Total Protein: 6.2 g/dL — ABNORMAL LOW (ref 6.5–8.1)

## 2024-03-04 LAB — MAGNESIUM: Magnesium: 1.8 mg/dL (ref 1.7–2.4)

## 2024-03-04 LAB — TROPONIN T, HIGH SENSITIVITY: Troponin T High Sensitivity: 24 ng/L — ABNORMAL HIGH (ref 0–19)

## 2024-03-04 MED ORDER — MEKTOVI 15 MG PO TABS
30.0000 mg | ORAL_TABLET | Freq: Two times a day (BID) | ORAL | 0 refills | Status: DC
  Filled 2024-03-04 (×2): qty 120, 30d supply, fill #0

## 2024-03-04 MED ORDER — TRIAMCINOLONE ACETONIDE 0.1 % EX CREA: 1.0000 | TOPICAL_CREAM | Freq: Two times a day (BID) | CUTANEOUS | 0 refills | Status: DC

## 2024-03-04 NOTE — Progress Notes (Signed)
 Patient Care Team: [name] LABOR, MD as PCP - General (Internal Medicine) Elmira Newman PARAS, MD as PCP - Cardiology (Cardiology) Celestia Joesph SQUIBB, RN as Oncology Nurse Navigator (Oncology)  Clinic Day:  03/06/2024  Referring physician: [name] LABOR, MD   CHIEF COMPLAINT:  CC: Metastatic malignant melanoma   ASSESSMENT & PLAN:   Assessment & Plan: Elizabeth Norman  is a 83 y.o. female with metastatic malignant melanoma  Malignant melanoma of the lower leg, right (HCC) Metastatic malignant melanoma initially diagnosed in 2022.  Extensive oncology history below S/p wide local excision and lymph node biopsy.  Adjuvant therapy with Opdualag  resulting in myocarditis requiring hospital admission and hence was discontinued Received radiation to femoral lesion and inguinal lymph node. NGS consistent with negative BRAF, positive NRAS and positive PTEN Second line: Pembrolizumab  Third line: Ipilimumab  added for progression along with pembrolizumab . Patient was started on binimetinib  on 02/03/2024  -Tolerating binimetinib  well.  Reports no gastrointestinal symptoms, fatigue, visual difficulties. - Reports some lower leg muscle pains and erythematous papular rash around her lips. - Reemphasized side effects of Binimetinib -gastrointestinal symptoms (nausea, diarrhea, vomiting), fatigue, rash, and muscle-related issues like myalgia or elevated creatine phosphokinase. Serious but less frequent risks include ocular toxicities (e.g. serous retinopathy), liver enzyme elevations, and potential cardiomyopathy or cardiovascular dysfunction. -Labs reviewed today: CMP: Creatinine: Normal, alkaline phosphatase: 173, AST: 61-slightly elevated 10, ALT: 25.  CBC: WNL - Continue binimetinib  30 mg twice daily.  Can dose adjust based on her tolerance and response. - Will repeat PET scan in 2 months  Return to clinic in 2 months with PET scan  Rash Erythematous papular rash around mouth.  Improved with  lymph use as per patient. Likely secondary to binimetinib   - Prescribed triamcinolone 0.1% to be used twice daily as needed.  Leg cramps Likely secondary to binimetinib .  Creatinine is normal today  - Encouraged patient to stay hydrated.  Hypothyroidism due to medication Likely secondary to immunotherapy On levothyroxine  50 mcg daily Recent TSH on labs was normal  - Continue levothyroxine  50 mcg daily  Normocytic anemia Stable at this time.  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  She knows to contact our office if she develops concerns prior to her next appointment.  20 minutes of total time was spent for this patient encounter, including preparation,review of records,  face-to-face counseling with the patient and coordination of care, physical exam, and documentation of the encounter.   I, Marijo Sharps, acting as a neurosurgeon for [employer], MD.,have documented all relevant documentation on the behalf of Mickiel Dry, MD,as directed by  Mickiel Dry, MD while in the presence of Mickiel Dry, MD.  I, Mickiel Dry MD, have reviewed the above documentation for accuracy and completeness, and I agree with the above.    Mickiel Dry, MD  [facility] [place] - A DEPT OF Elizabeth Norman [facility] [address] [place] [address] Dept: [phone] Dept Fax: [phone]   Orders Placed This Encounter  Procedures   Ferritin    Standing Status:   Future    Expected Date:   04/06/2024    Expiration  Date:   07/05/2024   Folate    Standing Status:   Future     Expected Date:   04/06/2024    Expiration Date:   07/05/2024   Vitamin B12    Standing Status:   Future    Expected Date:   04/06/2024    Expiration Date:   07/05/2024   Iron and TIBC    Standing Status:   Future    Expected Date:   04/06/2024    Expiration Date:   07/05/2024   CBC with Differential/Platelet    Standing Status:   Future    Expected Date:   04/06/2024    Expiration Date:   07/05/2024   Comprehensive metabolic panel with GFR    Standing Status:   Future    Expected Date:   04/06/2024    Expiration Date:   07/05/2024     ONCOLOGY HISTORY:   I have reviewed her chart and materials related to her cancer extensively and collaborated history with the patient. Summary of oncologic history is as follows:   Diagnosis: Malignant melanoma of the right posterior leg (pT4 pN3 M1), BRAF V600 negative   -Presentation: fleshy lesion on right leg -12/2020: Right lower leg lesion: Biopsy: Malignant melanoma, nodular subtype, Breslow thickness 9.52 mm, no satellitosis, no ulceration, no LVI.  Deep margin was positive.  There were 2 mitosis per millimeter squared.  Path was pT4a. ( Per documentation) -02/12/2021: Wide local lesion excision and lymph node biopsy.  Pathology: Residual malignant melanoma, 7.0 mm. Margins free of melanoma. No lymphovascular invasion present. Three of three positive sentinel lymph nodes for melanoma (3/3). Pathologic staging: pT4a, pN3. MelanA and Sox-10 highlight the melanoma.  -03/22/2021: Initial PET: Three foci of intense metabolic activity within the RIGHT lower extremity consistent with metastatic melanoma. Lesions involve the subcutaneous tissue, intramedullary bone of the RIGHT femur as well as soft tissue activity in the RIGHT condylar notch. Focus of metabolic activity adjacent to the RIGHT iliac wing is indeterminate differential including metastatic melanoma versus trauma. No evidence of visceral metastasis in the chest abdomen pelvis. Focus of  intense radiotracer activity at the angle of the RIGHT jaw is favored to localize within the RIGHT parotid gland. Favor primary parotid neoplasm. -04/05/2021: Port insertion -04/10/2021: MRI brain: No intracranial metastatic disease. No acute intracranial abnormality. -04/15/2021: MRI of right hip and right knee: Solitary osseous metastasis posteriorly in the distal right femoral diaphysis. Multiple nodal metastases anteromedially in the proximal to mid right thigh. The hypermetabolic activity in the right knee intercondylar notch on prior PET-CT shows no clear corresponding enhancing lesion and may relate to a small synovial cyst or inflammation. -04/19/2021:[facility] NGS: BRAF negative, positive for NRAS pathogenic variant exon 3, TERT promoter.  MSI-stable.  MMR-proficient.  NTRK 1/2/3 fusion not detected.  KIT mutation negative.  PD-L1 negative.  -05/09/2021: Nivolumab -Relatlimab  (Opdualag ) every 28 days, discontinued after first dose due to myocarditis -05/2021-07/2021: Off treatment -07/25/2021: PET: Hypermetabolic lymph nodes in the LEFT groin adjacent to lymphadenectomy clips consistent with melanoma recurrence. Hypermetabolic soft tissue nodule in the subcutaneous tissue of the medial RIGHT thigh consistent with melanoma. Hypermetabolic nodule within the medullary space of the midshaft RIGHT femur consistent skeletal metastasis. No evidence of metastatic melanoma above the RIGHT groin lesions. -09/03/2021-01/13/2023: Pembrolizumab    -02/13/2022: PET:  Interval recurrence of FDG avid lesion within the mid shaft of the right femur. Interval increase in size and degree of FDG uptake associated with soft tissue nodule within the medial right thigh.  Similar appearance of tracer avid nodule within the deep right parotid gland. This is favored to represent a primary parotid neoplasm. -03/26/2022-04/09/2022: Radiation therapy to soft tissue nodule within right medial thigh and midshaft right femoral lesion   -12/2022: Guardant360: NRAS Q61R (? Binimetinib ), TP 53 C17 6Y, TERT promoter SNV, TMB 20.8, MSI high not detected.  -01/01/2023: PET: Postsurgical changes in the medial right calf. Right femoral osseous metastasis and metastatic lesion/node in the medial right thigh, grossly unchanged. New right pelvic nodal metastases, reflecting progression of disease. -01/14/2023-current: Pembrolizumab  with low-dose ipilimumab   -05/22/2023: PET: There are 2 soft tissue nodules within the right upper quadrant of the abdomen which are tracer avid, new from the previous PET-CT and new from the CT from 03/30/2023. Indeterminate. Peritoneal metastasis cannot be excluded. Multiple tracer avid right-sided pelvic lymph nodes are identified compatible with nodal metastasis. These are new compared with the previous PET-CT from 01/01/2023. There is a new focal area of increased radiotracer uptake within the anterior inferior aspect of the L3 vertebral body. No corresponding CT abnormality identified. Findings are concerning for osseous metastasis. Stable appearance of tracer avid lesion within the distal diaphysis of the right femur. Stable appearance of soft tissue nodule within the medial right thigh at the site of treated melanoma. -06/09/2023: Guardant360: NRAS Q61R (binimetinib ), PTEN deletion, exon 3-9 (Capivasertib), PTEN deletion exon 1-6 (Capivasertib), TP 53, TERT promoter SNV  -07/23/2023: PET: Somewhat mixed response to therapy with enlarging and increasingly hypermetabolic right inguinal adenopathy and a new L5 metastasis. Stable or decreased hypermetabolism involving right upper quadrant peritoneal nodules, right iliac chain adenopathy, L5 and right femoral metastases and a treated soft tissue lesion in the medial right thigh. Decreasing hypermetabolism in the gallbladder fossa, possibly postoperative in etiology. Hypermetabolic medial right parotid nodule, stable.  -08/31/2023: Radiation therapy to right inguinal  lymph node -01/28/2024: PET: Mixed response to therapy. Interval increase in size and metabolic activity of lesion in the proximal RIGHT humerus. Interval increase in size and metabolic activity of lesion in the proximal RIGHT femur. New hypermetabolic lesions in the L1 and T11 vertebral bodies. Stable hypermetabolic lesion in the L3 vertebral body. Stable hypermetabolic lesion in the RIGHT chest wall. Stable hypermetabolic lesion in the RIGHT external iliac and inguinal lymph nodes. Stable hypermetabolic lesion in the RIGHT medial thigh. Decrease in metabolic activity of gallbladder lesion. - 02/03/2024-current: Binimetinib  30 mg twice daily  Current Treatment:  Binimetinib   INTERVAL HISTORY:   Elizabeth Norman is here today for follow up for metastatic melanoma. Patient is accompanied by her son today.  Elizabeth Norman notes that she has some sores on her lips. She is treating the sores with a medicated lip balm she received from her dentist and reports improvement.  Elizabeth Norman notes that she has weakness in her knees when she walks. She denies nausea, diarrhea, vomiting, or changes in vision.  She is overall feeling well.  Since her last visit, patient was seen for symptomatic management because of burning urination and pedal edema.  Both of which have resolved at this time.  I have reviewed the past medical history, past surgical history, social history and family history with the patient and they are unchanged from previous note.  ALLERGIES:  is allergic to tape.  MEDICATIONS:  Current Outpatient Medications  Medication Sig Dispense Refill   triamcinolone cream (KENALOG) 0.1 % Apply 1 Application topically 2 (two) times daily. 30 g 0   ALPRAZolam  (XANAX ) 0.5 MG tablet Take 0.5 mg by mouth 3 (three)  times daily.  2   binimetinib  (MEKTOVI ) 15 MG tablet Take 2 tablets (30 mg total) by mouth 2 (two) times daily. 120 tablet 0   calcium  carbonate (OSCAL) 1500 (600 Ca) MG TABS tablet Take 600 mg of elemental  calcium  by mouth 3 (three) times a week.     cholecalciferol (VITAMIN D) 1000 UNITS tablet Take 1,000 Units by mouth 3 (three) times a week.     Cyanocobalamin (VITAMIN B-12 PO) Take 1 tablet by mouth once a week.     diphenhydramine -acetaminophen  (TYLENOL  PM) 25-500 MG TABS tablet Take 1 tablet by mouth at bedtime as needed.     furosemide (LASIX) 20 MG tablet Take 1 tablet (20 mg total) by mouth daily for 5 days. 5 tablet 0   levothyroxine  (SYNTHROID ) 125 MCG tablet Take 1 tablet (125 mcg total) by mouth daily before breakfast. 30 tablet 3   metoprolol  tartrate (LOPRESSOR ) 25 MG tablet Take 1 tablet (25 mg total) by mouth 2 (two) times daily. Hold until follow-up with your doctor/cardiologist     miconazole (MICOTIN) 2 % cream Apply 1 Application topically 2 (two) times daily.     ondansetron  (ZOFRAN -ODT) 4 MG disintegrating tablet Take 1 tablet (4 mg total) by mouth every 6 (six) hours as needed for nausea. 20 tablet 0   prednisoLONE  acetate (PRED FORTE ) 1 % ophthalmic suspension Place 1 drop into both eyes daily.     prochlorperazine (COMPAZINE) 10 MG tablet Take 1 tablet (10 mg total) by mouth every 6 (six) hours as needed for nausea or vomiting. 90 tablet 3   No current facility-administered medications for this visit.    REVIEW OF SYSTEMS:   Constitutional: Denies fevers, chills or abnormal weight loss Eyes: Denies blurriness of vision Ears, nose, mouth, throat, and face: Denies mucositis or sore throat Respiratory: Denies cough, dyspnea or wheezes Cardiovascular: Denies palpitation, chest discomfort or lower extremity swelling Gastrointestinal:  Denies nausea, heartburn or change in bowel habits Skin: Denies abnormal skin rashes Lymphatics: Denies new lymphadenopathy or easy bruising Neurological:Denies numbness, tingling or new weaknesses Behavioral/Psych: Mood is stable, no new changes  All other systems were reviewed with the patient and are negative.   VITALS:  Blood  pressure 119/64, pulse 87, temperature 98.1 F (36.7 C), resp. rate 18, weight 171 lb (77.6 kg), SpO2 99%.  Wt Readings from Last 3 Encounters:  03/04/24 171 lb (77.6 kg)  02/24/24 173 lb (78.5 kg)  02/03/24 162 lb 0.6 oz (73.5 kg)    Body mass index is 31.28 kg/m.  Performance status (ECOG): 2 - Symptomatic, <50% confined to bed  PHYSICAL EXAM:   GENERAL:alert, no distress and comfortable SKIN: skin color, texture, turgor are normal, no rashes or significant lesions LYMPH: Palpable right inguinal lymphadenopathy. LUNGS: clear to auscultation and percussion with normal breathing effort HEART: regular rate & rhythm and no murmurs and no lower extremity edema ABDOMEN:abdomen soft, non-tender and normal bowel sounds Musculoskeletal: Induration in the right thigh 5 into 3 cm in diameter  NEURO: alert & oriented x 3 with fluent speech, no focal motor/sensory deficits  LABORATORY DATA:  I have reviewed the data as listed   Lab Results  Component Value Date   WBC 4.8 03/04/2024   NEUTROABS 2.8 03/04/2024   HGB 12.0 03/04/2024   HCT 37.8 03/04/2024   MCV 88.5 03/04/2024   PLT 158 03/04/2024      Chemistry      Component Value Date/Time   NA 134 (L) 03/04/2024 9050  K 4.3 03/04/2024 0949   CL 98 03/04/2024 0949   CO2 28 03/04/2024 0949   BUN 13 03/04/2024 0949   CREATININE 0.81 03/04/2024 0949      Component Value Date/Time   CALCIUM  8.4 (L) 03/04/2024 0949   ALKPHOS 173 (H) 03/04/2024 0949   AST 61 (H) 03/04/2024 0949   ALT 25 03/04/2024 0949   BILITOT 0.4 03/04/2024 0949       RADIOGRAPHIC STUDIES: I have personally reviewed the radiological images as listed and agreed with the findings in the report.  None new to review.

## 2024-03-04 NOTE — Telephone Encounter (Signed)
 Chart reviewed. Mektovi  refilled per verbal order from Dr. Davonna.

## 2024-03-04 NOTE — Progress Notes (Signed)
 Oral Oncology Patient Advocate Encounter  Patient waiting on response from the patient assistance program. Please do not fill medication.  Maxime Beckner ([name]) [name], CPhT  [facility], [name], [facility] - Oral Chemotherapy Patient Advocate Specialist III Phone: [phone]  Fax: [phone]

## 2024-03-04 NOTE — Progress Notes (Signed)
 Benefits Investigation Started  Reason: Refill too soon  Routed to: [facility]

## 2024-03-06 ENCOUNTER — Encounter (HOSPITAL_COMMUNITY): Payer: Self-pay | Admitting: Oncology

## 2024-03-07 NOTE — Telephone Encounter (Signed)
 Oral Oncology Patient Advocate Encounter  Called to check on the status of the patient's application. Per the representative the application is still under review and we will receive an update in 24/48 hours.  [employer] phone number [phone].   I will continue to check the status until final determination.  Nichlas Pitera ([name]) [name], CPhT  [facility], [name], [facility] - Oral Chemotherapy Patient Advocate Specialist III Phone: [phone]  Fax: [phone]

## 2024-03-08 ENCOUNTER — Encounter (HOSPITAL_COMMUNITY): Payer: Self-pay | Admitting: Oncology

## 2024-03-08 ENCOUNTER — Other Ambulatory Visit: Payer: Self-pay | Admitting: Pharmacy Technician

## 2024-03-08 NOTE — Telephone Encounter (Signed)
 Oral Oncology Patient Advocate Encounter   Received notification that the application for assistance for Mektovi  through [employer] has been approved.   [employer] phone number [phone].   Effective dates: 03/08/2024 through 05/11/2024  Medication will be filled at [facility].  I have spoken to the patient.  Dustee Bottenfield ([name]) [name], CPhT  [facility], [name], [facility] - Oral Chemotherapy Patient Advocate Specialist III Phone: [phone]  Fax: [phone]

## 2024-03-08 NOTE — Progress Notes (Signed)
 Oral Oncology Patient Advocate Encounter  Patient has been approved for patient assistance.  Disenrolling patient.  Hamlet Lasecki ([name]) [name], CPhT  [facility], [name], [facility] - Oral Chemotherapy Patient Advocate Specialist III Phone: [phone]  Fax: [phone]

## 2024-03-09 ENCOUNTER — Other Ambulatory Visit (HOSPITAL_COMMUNITY): Payer: Self-pay

## 2024-03-09 ENCOUNTER — Encounter (HOSPITAL_COMMUNITY): Payer: Self-pay | Admitting: Oncology

## 2024-03-16 ENCOUNTER — Inpatient Hospital Stay

## 2024-03-24 ENCOUNTER — Other Ambulatory Visit: Payer: Self-pay | Admitting: *Deleted

## 2024-03-24 ENCOUNTER — Encounter: Payer: Self-pay | Admitting: Oncology

## 2024-03-24 DIAGNOSIS — R21 Rash and other nonspecific skin eruption: Secondary | ICD-10-CM

## 2024-03-24 MED ORDER — TRIAMCINOLONE ACETONIDE 0.1 % EX CREA: 1.0000 | TOPICAL_CREAM | Freq: Two times a day (BID) | CUTANEOUS | 0 refills | Status: DC

## 2024-03-30 ENCOUNTER — Other Ambulatory Visit (HOSPITAL_COMMUNITY): Payer: Self-pay

## 2024-03-31 ENCOUNTER — Ambulatory Visit (HOSPITAL_COMMUNITY)
Admission: RE | Admit: 2024-03-31 | Discharge: 2024-03-31 | Disposition: A | Discharge status: Home / Self Care | Source: Ambulatory Visit | Attending: Oncology | Admitting: Oncology

## 2024-03-31 DIAGNOSIS — C4371 Malignant melanoma of right lower limb, including hip: Secondary | ICD-10-CM | POA: Diagnosis present

## 2024-03-31 MED ORDER — FLUDEOXYGLUCOSE F - 18 (FDG) INJECTION
7.1100 | Freq: Once | INTRAVENOUS | Status: AC | PRN
  Administered 2024-03-31: 7.11 via INTRAVENOUS

## 2024-04-06 ENCOUNTER — Inpatient Hospital Stay

## 2024-04-06 ENCOUNTER — Inpatient Hospital Stay: Attending: Hematology | Admitting: Oncology

## 2024-04-06 VITALS — BP 143/71 | HR 91 | Temp 97.4°F | Resp 18 | Wt 173.0 lb

## 2024-04-06 DIAGNOSIS — E032 Hypothyroidism due to medicaments and other exogenous substances: Secondary | ICD-10-CM | POA: Insufficient documentation

## 2024-04-06 DIAGNOSIS — R21 Rash and other nonspecific skin eruption: Secondary | ICD-10-CM | POA: Insufficient documentation

## 2024-04-06 DIAGNOSIS — Z79899 Other long term (current) drug therapy: Secondary | ICD-10-CM | POA: Diagnosis not present

## 2024-04-06 DIAGNOSIS — D61818 Other pancytopenia: Secondary | ICD-10-CM | POA: Diagnosis not present

## 2024-04-06 DIAGNOSIS — Z923 Personal history of irradiation: Secondary | ICD-10-CM | POA: Diagnosis not present

## 2024-04-06 DIAGNOSIS — C4371 Malignant melanoma of right lower limb, including hip: Secondary | ICD-10-CM | POA: Insufficient documentation

## 2024-04-06 DIAGNOSIS — I514 Myocarditis, unspecified: Secondary | ICD-10-CM | POA: Diagnosis not present

## 2024-04-06 DIAGNOSIS — C7989 Secondary malignant neoplasm of other specified sites: Secondary | ICD-10-CM | POA: Insufficient documentation

## 2024-04-06 DIAGNOSIS — E8809 Other disorders of plasma-protein metabolism, not elsewhere classified: Secondary | ICD-10-CM | POA: Diagnosis not present

## 2024-04-06 DIAGNOSIS — E538 Deficiency of other specified B group vitamins: Secondary | ICD-10-CM | POA: Diagnosis not present

## 2024-04-06 DIAGNOSIS — C7951 Secondary malignant neoplasm of bone: Secondary | ICD-10-CM | POA: Insufficient documentation

## 2024-04-06 DIAGNOSIS — T50905A Adverse effect of unspecified drugs, medicaments and biological substances, initial encounter: Secondary | ICD-10-CM

## 2024-04-06 DIAGNOSIS — L539 Erythematous condition, unspecified: Secondary | ICD-10-CM | POA: Insufficient documentation

## 2024-04-06 LAB — CBC WITH DIFFERENTIAL/PLATELET
Abs Immature Granulocytes: 0.01 K/uL (ref 0.00–0.07)
Basophils Absolute: 0 K/uL (ref 0.0–0.1)
Basophils Relative: 1 %
Eosinophils Absolute: 0.1 K/uL (ref 0.0–0.5)
Eosinophils Relative: 2 %
HCT: 35 % — ABNORMAL LOW (ref 36.0–46.0)
Hemoglobin: 10.9 g/dL — ABNORMAL LOW (ref 12.0–15.0)
Immature Granulocytes: 0 %
Lymphocytes Relative: 21 %
Lymphs Abs: 0.7 K/uL (ref 0.7–4.0)
MCH: 29.3 pg (ref 26.0–34.0)
MCHC: 31.1 g/dL (ref 30.0–36.0)
MCV: 94.1 fL (ref 80.0–100.0)
Monocytes Absolute: 0.3 K/uL (ref 0.1–1.0)
Monocytes Relative: 9 %
Neutro Abs: 2.4 K/uL (ref 1.7–7.7)
Neutrophils Relative %: 67 %
Platelets: 139 K/uL — ABNORMAL LOW (ref 150–400)
RBC: 3.72 MIL/uL — ABNORMAL LOW (ref 3.87–5.11)
RDW: 18.4 % — ABNORMAL HIGH (ref 11.5–15.5)
WBC: 3.5 K/uL — ABNORMAL LOW (ref 4.0–10.5)
nRBC: 0 % (ref 0.0–0.2)

## 2024-04-06 LAB — COMPREHENSIVE METABOLIC PANEL WITH GFR
ALT: 27 U/L (ref 0–44)
AST: 68 U/L — ABNORMAL HIGH (ref 15–41)
Albumin: 3.3 g/dL — ABNORMAL LOW (ref 3.5–5.0)
Alkaline Phosphatase: 145 U/L — ABNORMAL HIGH (ref 38–126)
Anion gap: 9 (ref 5–15)
BUN: 10 mg/dL (ref 8–23)
CO2: 27 mmol/L (ref 22–32)
Calcium: 8.7 mg/dL — ABNORMAL LOW (ref 8.9–10.3)
Chloride: 101 mmol/L (ref 98–111)
Creatinine, Ser: 0.79 mg/dL (ref 0.44–1.00)
GFR, Estimated: >60 mL/min (ref >60)
Glucose, Bld: 104 mg/dL — ABNORMAL HIGH (ref 70–99)
Potassium: 4.1 mmol/L (ref 3.5–5.1)
Sodium: 137 mmol/L (ref 135–145)
Total Bilirubin: 0.6 mg/dL (ref 0.0–1.2)
Total Protein: 5.9 g/dL — ABNORMAL LOW (ref 6.5–8.1)

## 2024-04-06 LAB — IRON AND TIBC
Iron: 67 ug/dL (ref 28–170)
Saturation Ratios: 22 % (ref 10.4–31.8)
TIBC: 301 ug/dL (ref 250–450)
UIBC: 234 ug/dL

## 2024-04-06 LAB — FOLATE: Folate: 15.2 ng/mL (ref >5.9)

## 2024-04-06 LAB — VITAMIN B12: Vitamin B-12: 294 pg/mL (ref 180–914)

## 2024-04-06 LAB — FERRITIN: Ferritin: 68 ng/mL (ref 11–307)

## 2024-04-06 MED ORDER — TRIAMCINOLONE ACETONIDE 0.1 % EX CREA: 1.0000 | TOPICAL_CREAM | Freq: Two times a day (BID) | CUTANEOUS | 10 refills | Status: AC

## 2024-04-06 MED ORDER — MEKTOVI 15 MG PO TABS: 30.0000 mg | ORAL_TABLET | Freq: Two times a day (BID) | ORAL | 2 refills | Status: AC

## 2024-04-06 NOTE — Progress Notes (Signed)
 Patient Care Team: [name] LABOR, MD as PCP - General (Internal Medicine) Elmira Newman PARAS, MD as PCP - Cardiology (Cardiology) Celestia Joesph SQUIBB, RN as Oncology Nurse Navigator (Oncology)  Clinic Day:  04/06/2024  Referring physician: [name] LABOR, MD   CHIEF COMPLAINT:  CC: Metastatic malignant melanoma   ASSESSMENT & PLAN:   Assessment & Plan: Elizabeth Norman  is a 83 y.o. female with metastatic malignant melanoma  Malignant melanoma of the lower leg, right (HCC) Metastatic malignant melanoma initially diagnosed in 2022.  Extensive oncology history below S/p wide local excision and lymph node biopsy.  Adjuvant therapy with Opdualag  resulting in myocarditis requiring hospital admission and hence was discontinued Received radiation to femoral lesion and inguinal lymph node. NGS consistent with negative BRAF, positive NRAS and positive PTEN Second line: Pembrolizumab  Third line: Ipilimumab  added for progression along with pembrolizumab . Patient was started on binimetinib  on 02/03/2024  -We reviewed the PET scan findings together.  Patient has interval response to therapy with decreased metabolic activity and previously demonstrated osseous and soft tissue metastasis.  There is persistent intense hypermetabolic activity within the distal right femoral lesion. -Labs reviewed today: CMP: Creatinine: Normal, alkaline phosphatase: 145 AST: 68-slightly elevated  but stable, ALT: 25.  CBC: WBC:3.5, Hb:10.9, PLT:139 -Tolerating binimetinib  well.  Reports no gastrointestinal symptoms, fatigue, visual difficulties. - Reports erythematous papular rash around her lips and on her face mostly.Very sparse on her arms - Reemphasized side effects of Binimetinib -gastrointestinal symptoms (nausea, diarrhea, vomiting), fatigue, rash, and muscle-related issues like myalgia or elevated creatine phosphokinase. Serious but less frequent risks include ocular toxicities (e.g. serous retinopathy), liver  enzyme elevations, and potential cardiomyopathy or cardiovascular dysfunction. - Continue binimetinib  30 mg twice daily.  Can dose adjust based on her tolerance and response. - Will repeat PET scan in 3 months  Return to clinic in 3 months with PET scan  Rash Erythematous papular rash around mouth and face. Likely secondary to binimetinib   - Prescribed triamcinolone  0.1% to be used twice daily as needed.   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  She knows to contact our office if she develops concerns prior to her next appointment.  25 minutes of total time was spent for this patient encounter, including preparation,review of records,  face-to-face counseling with the patient and coordination of care, physical exam, and documentation of the encounter.    Elizabeth Norman,acting as a neurosurgeon for Mickiel Dry, MD.,have documented all relevant documentation on the behalf of Mickiel Dry, MD,as directed by  Mickiel Dry, MD while in the presence of Mickiel Dry, MD.  I, Mickiel Dry MD, have reviewed the above documentation for accuracy and completeness, and I agree with the above.    [employer] R Norman   [place]  [facility] [place] - A DEPT OF [facility] [address] [place] [address] Dept: [phone] Dept Fax: [phone]   No orders of the defined types were placed in this encounter.    ONCOLOGY HISTORY:   I have reviewed her chart and materials related to her cancer extensively and collaborated history with the patient. Summary of oncologic history is as follows:   Diagnosis: Malignant melanoma of the right posterior leg (pT4 pN3 M1), BRAF V600 negative   -Presentation: fleshy lesion on right leg -12/2020: Right lower leg lesion: Biopsy: Malignant melanoma, nodular subtype, Breslow thickness 9.52 mm, no satellitosis, no ulceration, no LVI.  Deep margin was positive.  There were 2 mitosis per millimeter squared.  Path was pT4a. ( Per documentation) -02/12/2021: Wide local lesion excision and lymph node biopsy.  Pathology: Residual malignant melanoma, 7.0 mm. Margins free of melanoma. No lymphovascular invasion present. Three of three positive sentinel lymph nodes for melanoma (3/3). Pathologic staging: pT4a, pN3. MelanA and Sox-10 highlight the melanoma.  -03/22/2021: Initial PET: Three foci of intense metabolic activity within the RIGHT lower extremity consistent with metastatic melanoma. Lesions involve the subcutaneous tissue, intramedullary bone of the RIGHT femur as well as soft tissue activity in the RIGHT condylar notch. Focus of metabolic activity adjacent to the RIGHT iliac wing is indeterminate differential including metastatic melanoma versus trauma. No evidence of visceral metastasis in the chest abdomen pelvis. Focus of intense radiotracer activity at the angle of the RIGHT jaw is favored to localize within the RIGHT parotid gland. Favor primary parotid neoplasm. -04/05/2021: Port insertion -04/10/2021: MRI brain: No intracranial metastatic disease. No acute intracranial abnormality. -04/15/2021: MRI of right hip and  right knee: Solitary osseous metastasis posteriorly in the distal right femoral diaphysis. Multiple nodal metastases anteromedially in the proximal to mid right thigh. The hypermetabolic activity in the right knee intercondylar notch on prior PET-CT shows no clear corresponding enhancing lesion and may relate to a small synovial cyst or inflammation. -04/19/2021:[facility] NGS: BRAF negative, positive for NRAS pathogenic variant exon 3, TERT promoter.  MSI-stable.  MMR-proficient.  NTRK 1/2/3 fusion not detected.  KIT mutation negative.  PD-L1 negative.  -05/09/2021: Nivolumab -Relatlimab  (Opdualag ) every 28 days, discontinued after first dose due to myocarditis -05/2021-07/2021: Off treatment -07/25/2021: PET: Hypermetabolic lymph nodes in the LEFT groin adjacent to lymphadenectomy clips consistent with melanoma recurrence. Hypermetabolic soft tissue nodule in the subcutaneous tissue of the medial RIGHT thigh consistent with melanoma. Hypermetabolic nodule within the medullary space of the midshaft RIGHT femur consistent skeletal metastasis. No evidence of metastatic melanoma above the RIGHT groin lesions. -09/03/2021-01/13/2023: Pembrolizumab    -02/13/2022: PET:  Interval recurrence of FDG avid lesion within the mid shaft of the right femur. Interval increase in size and degree of FDG uptake associated with soft tissue nodule within the medial right thigh. Similar appearance of tracer avid nodule within the deep right parotid gland. This is favored to represent a primary parotid neoplasm. -03/26/2022-04/09/2022: Radiation therapy to soft tissue nodule within right medial thigh and midshaft right femoral lesion  -12/2022: Guardant360: NRAS Q61R (? Binimetinib ), TP 53 C17 6Y, TERT promoter SNV, TMB 20.8, MSI high not detected.  -01/01/2023: PET: Postsurgical changes in the medial right calf. Right femoral osseous metastasis and metastatic lesion/node in the medial right thigh, grossly unchanged. New right pelvic  nodal metastases, reflecting progression of disease. -01/14/2023-current: Pembrolizumab  with low-dose ipilimumab   -05/22/2023: PET: There are 2 soft tissue nodules within the right upper  quadrant of the abdomen which are tracer avid, new from the previous PET-CT and new from the CT from 03/30/2023. Indeterminate. Peritoneal metastasis cannot be excluded. Multiple tracer avid right-sided pelvic lymph nodes are identified compatible with nodal metastasis. These are new compared with the previous PET-CT from 01/01/2023. There is a new focal area of increased radiotracer uptake within the anterior inferior aspect of the L3 vertebral body. No corresponding CT abnormality identified. Findings are concerning for osseous metastasis. Stable appearance of tracer avid lesion within the distal diaphysis of the right femur. Stable appearance of soft tissue nodule within the medial right thigh at the site of treated melanoma. -06/09/2023: Guardant360: NRAS Q61R (binimetinib ), PTEN deletion, exon 3-9 (Capivasertib), PTEN deletion exon 1-6 (Capivasertib), TP 53, TERT promoter SNV  -07/23/2023: PET: Somewhat mixed response to therapy with enlarging and increasingly hypermetabolic right inguinal adenopathy and a new L5 metastasis. Stable or decreased hypermetabolism involving right upper quadrant peritoneal nodules, right iliac chain adenopathy, L5 and right femoral metastases and a treated soft tissue lesion in the medial right thigh. Decreasing hypermetabolism in the gallbladder fossa, possibly postoperative in etiology. Hypermetabolic medial right parotid nodule, stable.  -08/31/2023: Radiation therapy to right inguinal lymph node -01/28/2024: PET: Mixed response to therapy. Interval increase in size and metabolic activity of lesion in the proximal RIGHT humerus. Interval increase in size and metabolic activity of lesion in the proximal RIGHT femur. New hypermetabolic lesions in the L1 and T11 vertebral bodies. Stable  hypermetabolic lesion in the L3 vertebral body. Stable hypermetabolic lesion in the RIGHT chest wall. Stable hypermetabolic lesion in the RIGHT external iliac and inguinal lymph nodes. Stable hypermetabolic lesion in the RIGHT medial thigh. Decrease in metabolic activity of gallbladder lesion. - 02/03/2024-current: Binimetinib  30 mg twice daily -03/31/2024: PET: Interval response to therapy with decreased metabolic activity associated with previously demonstrated osseous and soft tissue metastases. There is persistent intense hypermetabolic activity within the distal right femoral lesion. No definite new or enlarging metastases identified. New prominent vascular activity within the left brachial, axillary and subclavian veins with a new nearby focal hypermetabolic activity inferiorly in the left axilla, favored to reflect a venous collateral (rather than a lymph node).  Current Treatment:  Binimetinib  30mg  twice daily  INTERVAL HISTORY:   Elizabeth Norman is here today for follow up for metastatic melanoma. Patient is accompanied by her son today.  Tiyona reports an erythematous rash on the face and bilateral arms due to Binimetinib . She has used the triamcinolone  cream on her arms, though was unaware the cream could be used on the face, which has made her rash worse on this area. This has caused scaling and flaking of the skin, which she has been treating with vasoline.   Sherice also notes some fatigue due to treatment, though she is fairly active in and outside of her home. She denies any nausea, vomiting, or diarrhea due to treatment.   Her albumin is slightly low today. She was previously drinking one protein drink a day, though recently stopped doing so and began drinking more milk. I encouraged her to begin drinking one protein shake a day.   I have reviewed the past medical history, past surgical history, social history and family history with the patient and they are unchanged from previous  note.  ALLERGIES:  is allergic to tape.  MEDICATIONS:  Current Outpatient Medications  Medication Sig Dispense Refill   ALPRAZolam  (XANAX ) 0.5 MG tablet Take 0.5 mg by mouth 3 (three) times daily.  2  binimetinib  (MEKTOVI ) 15 MG tablet Take 2 tablets (30 mg total) by mouth 2 (two) times daily. 120 tablet 0   calcium  carbonate (OSCAL) 1500 (600 Ca) MG TABS tablet Take 600 mg of elemental calcium  by mouth 3 (three) times a week.     cholecalciferol (VITAMIN D) 1000 UNITS tablet Take 1,000 Units by mouth 3 (three) times a week.     Cyanocobalamin (VITAMIN B-12 PO) Take 1 tablet by mouth once a week.     diphenhydramine -acetaminophen  (TYLENOL  PM) 25-500 MG TABS tablet Take 1 tablet by mouth at bedtime as needed.     furosemide  (LASIX ) 20 MG tablet Take 1 tablet (20 mg total) by mouth daily for 5 days. 5 tablet 0   levothyroxine  (SYNTHROID ) 125 MCG tablet Take 1 tablet (125 mcg total) by mouth daily before breakfast. 30 tablet 3   metoprolol  tartrate (LOPRESSOR ) 25 MG tablet Take 1 tablet (25 mg total) by mouth 2 (two) times daily. Hold until follow-up with your doctor/cardiologist     miconazole (MICOTIN) 2 % cream Apply 1 Application topically 2 (two) times daily.     ondansetron  (ZOFRAN -ODT) 4 MG disintegrating tablet Take 1 tablet (4 mg total) by mouth every 6 (six) hours as needed for nausea. 20 tablet 0   prednisoLONE  acetate (PRED FORTE ) 1 % ophthalmic suspension Place 1 drop into both eyes daily.     prochlorperazine  (COMPAZINE ) 10 MG tablet Take 1 tablet (10 mg total) by mouth every 6 (six) hours as needed for nausea or vomiting. 90 tablet 3   triamcinolone  cream (KENALOG ) 0.1 % Apply 1 Application topically 2 (two) times daily. 30 g 0   No current facility-administered medications for this visit.    REVIEW OF SYSTEMS:   All the systems were reviewed with the patient and are negative except HPI   VITALS:  There were no vitals taken for this visit.  Wt Readings from Last 3  Encounters:  03/04/24 171 lb (77.6 kg)  02/24/24 173 lb (78.5 kg)  02/03/24 162 lb 0.6 oz (73.5 kg)    There is no height or weight on file to calculate BMI.  Performance status (ECOG): 2 - Symptomatic, <50% confined to bed  PHYSICAL EXAM:   GENERAL:alert, no distress and comfortable SKIN: Erythematous rash on bilateral cheeks, around mouth and some on lips.  Very sparse on arms. LYMPH: Palpable right inguinal lymphadenopathy. LUNGS: clear to auscultation and percussion with normal breathing effort HEART: regular rate & rhythm and no murmurs and no lower extremity edema ABDOMEN:abdomen soft, non-tender and normal bowel sounds Musculoskeletal: Induration in the right thigh 5 into 3 cm in diameter  NEURO: alert & oriented x 3 with fluent speech  LABORATORY DATA:  I have reviewed the data as listed   Lab Results  Component Value Date   WBC 4.8 03/04/2024   NEUTROABS 2.8 03/04/2024   HGB 12.0 03/04/2024   HCT 37.8 03/04/2024   MCV 88.5 03/04/2024   PLT 158 03/04/2024      Chemistry      Component Value Date/Time   NA 134 (L) 03/04/2024 0949   K 4.3 03/04/2024 0949   CL 98 03/04/2024 0949   CO2 28 03/04/2024 0949   BUN 13 03/04/2024 0949   CREATININE 0.81 03/04/2024 0949      Component Value Date/Time   CALCIUM  8.4 (L) 03/04/2024 0949   ALKPHOS 173 (H) 03/04/2024 0949   AST 61 (H) 03/04/2024 0949   ALT 25 03/04/2024 0949   BILITOT 0.4  03/04/2024 0949      Latest Reference Range & Units 04/06/24 09:05  Iron 28 - 170 ug/dL 67  UIBC ug/dL 765  TIBC 749 - 549 ug/dL 698  Saturation Ratios 10.4 - 31.8 % 22  Ferritin 11 - 307 ng/mL 68  Folate >5.9 ng/mL 15.2  Vitamin B12 180 - 914 pg/mL 294    RADIOGRAPHIC STUDIES: I have personally reviewed the radiological images as listed and agreed with the findings in the report.  NM PET Image Restage (PS) Whole Body CLINICAL DATA:  Subsequent treatment strategy for melanoma.  EXAM: NUCLEAR MEDICINE PET WHOLE  BODY  TECHNIQUE: 7.11 mCi F-18 FDG was injected intravenously. Full-ring PET imaging was performed from the head to foot after the radiotracer. CT data was obtained and used for attenuation correction and anatomic localization.  Fasting blood glucose: 99 mg/dl  COMPARISON:  PET-CT 90/81/7974 and 10/15/2023  FINDINGS: Mediastinal blood pool activity: SUV max 0.8  HEAD/ NECK:  No hypermetabolic cervical lymph nodes are identified. No suspicious activity identified within the pharyngeal mucosal space.  Incidental CT findings: Bilateral carotid atherosclerosis.  CHEST:  New prominent vascular activity within the left brachial, axillary and subclavian veins. New nearby focal hypermetabolic activity inferiorly in the left axilla (SUV max 4.5) without corresponding enlarged lymph node, possibly a venous collateral. No other hypermetabolic mediastinal, hilar or axillary lymph nodes. No hypermetabolic pulmonary activity or suspicious nodularity.  Incidental CT findings: New small bilateral pleural effusions with increased central airway thickening and patchy airspace opacities at both lung bases, likely inflammatory. Right IJ Port-A-Cath extends to the mid SVC level. Atherosclerosis of the aorta, great vessels and coronary arteries with calcifications of the aortic valve and mild cardiomegaly.  ABDOMEN/PELVIS:  There is no hypermetabolic activity within the liver, adrenal glands, spleen or pancreas. Previously demonstrated hypermetabolic activity within the gallbladder has nearly resolved. Previously demonstrated right external iliac and inguinal adenopathy has improved. Right inguinal lymph node measuring 3.7 x 2.8 cm on image 248/202 has not significantly changed in size, although demonstrates less metabolic activity (SUV max 1.1, previously 14.2). No new adenopathy.  Incidental CT findings: Cholelithiasis or gallbladder wall calcifications. Aortic and branch vessel  atherosclerosis.  SKELETON:  Interval improvement in the metabolic activity associated with previously demonstrated osseous metastases. Lesion within the L3 vertebral body currently demonstrates an SUV max of 1.2, previously 7.9. There is also improved activity within the lesions involving the T11 vertebral body, left humeral head and right iliac bone. No new osseous metastases are identified. Left posterior chest wall/intercostal soft tissue nodule shows less metabolic activity. This nodule measures 1.6 cm on image 122/202 and demonstrates an SUV max of 0.9 (previously 6.6).  Incidental CT findings: Other soft tissue nodules posterolaterally between the scapula and chest wall are unchanged, most likely secondary to incidental elastofibroma dorsi.  EXTREMITIES: Soft tissue nodule medially in the mid right thigh measures 1.3 cm on image 301/202 and demonstrates decreased metabolic activity (SUV max 0.9, previously 5.3). Persistent intense hypermetabolic activity associated with the intramedullary lesion in the distal 3rd of the right femur. This measures 1.8 cm on image 333/202 and has an SUV max of 4.4. No new or enlarging osseous lesions are identified.  Incidental CT findings: Stable asymmetric enlargement of the right lower extremity relative to the left. Mild associated generalized subcutaneous edema. Scattered vascular calcifications.  IMPRESSION: 1. Interval response to therapy with decreased metabolic activity associated with previously demonstrated osseous and soft tissue metastases. There is persistent intense hypermetabolic activity within  the distal right femoral lesion. 2. No definite new or enlarging metastases identified. New prominent vascular activity within the left brachial, axillary and subclavian veins with a new nearby focal hypermetabolic activity inferiorly in the left axilla, favored to reflect a venous collateral (rather than a lymph node). 3. New  small bilateral pleural effusions with increased central airway thickening and patchy airspace opacities at both lung bases, likely inflammatory. 4. Stable probable incidental elastofibroma dorsi in the chest wall bilaterally. 5.  Aortic Atherosclerosis (ICD10-I70.0).  Electronically Signed   By: Elsie Perone M.D.   On: 04/05/2024 16:06

## 2024-04-06 NOTE — Patient Instructions (Signed)
 [facility] at [facility] Discharge Instructions   You were seen and examined today by Dr. Davonna.  She reviewed the results of your lab work which are normal/stable.   We will see you back in 3 months. We will repeat a PET scan and lab work prior to this visit.    Return as scheduled.    Thank you for choosing [facility] at [facility] to provide your oncology and hematology care.  To afford each patient quality time with our provider, please arrive at least 15 minutes before your scheduled appointment time.   If you have a lab appointment with the Cancer Center please come in thru the Main Entrance and check in at the main information desk.  You need to re-schedule your appointment should you arrive 10 or more minutes late.  We strive to give you quality time with our providers, and arriving late affects you and other patients whose appointments are after yours.  Also, if you no show three or more times for appointments you may be dismissed from the clinic at the providers discretion.     Again, thank you for choosing [facility].  Our hope is that these requests will decrease the amount of time that you wait before being seen by our physicians.       _____________________________________________________________  Should you have questions after your visit to [facility], please contact our office at [phone] and follow the prompts.  Our office hours are 8:00 a.m. and 4:30 p.m. Monday - Friday.  Please note that voicemails left after 4:00 p.m. may not be returned until the following business day.  We are closed weekends and major holidays.  You do have access to a nurse 24-7, just call the main number to the clinic [phone] and do not press any options, hold on the line and a nurse will answer the phone.    For prescription refill requests, have your pharmacy contact our office and allow 72 hours.    Due to  Covid, you will need to wear a mask upon entering the hospital. If you do not have a mask, a mask will be given to you at the Main Entrance upon arrival. For doctor visits, patients may have 1 support person age 51 or older with them. For treatment visits, patients can not have anyone with them due to social distancing guidelines and our immunocompromised population.

## 2024-04-06 NOTE — Progress Notes (Signed)
 Patients port flushed without difficulty.  Good blood return noted with no bruising or swelling noted at site.  Labs drawn per orders.  VSS with discharge and left in satisfactory condition with no s/s of distress noted. All follow ups as scheduled.       Elizabeth Norman

## 2024-04-11 ENCOUNTER — Other Ambulatory Visit (HOSPITAL_COMMUNITY): Payer: Self-pay

## 2024-04-11 ENCOUNTER — Telehealth: Payer: Self-pay | Admitting: Pharmacy Technician

## 2024-04-11 NOTE — Telephone Encounter (Signed)
 Oral Oncology Patient Advocate Encounter   Re-authorization   Received notification that prior authorization for [employer]  is required.   PA submitted on CMM via Latent Key [id number] Status is pending     Elizabeth Norman (Elizabeth Norman) [name], CPhT  [facility], [name], [facility] - Oral Chemotherapy Patient Advocate Specialist III Phone: [phone]  Fax: [phone]

## 2024-04-11 NOTE — Telephone Encounter (Signed)
 Oral Oncology Patient Advocate Encounter  Prior Authorization for M[employer]  has been approved.    PA# [id number] Effective dates: 05/11/2024 through 05/11/2025  Patients co-pay is $0.    Tykel Badie ([name]) [name], CPhT  [facility], [name], [facility] - Oral Chemotherapy Patient Advocate Specialist III Phone: [phone]  Fax: [phone]

## 2024-04-18 ENCOUNTER — Telehealth: Payer: Self-pay | Admitting: Pharmacy Technician

## 2024-04-18 ENCOUNTER — Other Ambulatory Visit (HOSPITAL_COMMUNITY): Payer: Self-pay

## 2024-04-18 ENCOUNTER — Other Ambulatory Visit: Payer: Self-pay | Admitting: *Deleted

## 2024-04-18 ENCOUNTER — Telehealth: Payer: Self-pay | Admitting: *Deleted

## 2024-04-18 DIAGNOSIS — R600 Localized edema: Secondary | ICD-10-CM

## 2024-04-18 MED ORDER — FUROSEMIDE 20 MG PO TABS: 20.0000 mg | ORAL_TABLET | Freq: Every day | ORAL | 0 refills | Status: AC

## 2024-04-18 NOTE — Telephone Encounter (Signed)
 Oral Oncology Patient Advocate Encounter   Submitted re-enrollment application for assistance for Mektovi  to [employer].   Application submitted via e-fax to [phone]   [employer] phone number [phone].   I will continue to check the status until final determination.   Elizabeth Norman (Elizabeth Norman) Elizabeth Norman, CPhT  [facility], Elizabeth Norman, [facility] - Oral Chemotherapy Patient Advocate Specialist III Phone: [phone]  Fax: [phone]

## 2024-04-18 NOTE — Telephone Encounter (Signed)
 Reports swelling in bilateral lower extremities. Sent her a refill on Lasix  20 mg daily as needed and advised to elevate feet and use compression stockings. Will call back in 2 days if no improvement. No other associated symptoms. [place], [facility] aware and agrees with plan.

## 2024-04-21 ENCOUNTER — Other Ambulatory Visit: Payer: Self-pay

## 2024-04-21 DIAGNOSIS — C4371 Malignant melanoma of right lower limb, including hip: Secondary | ICD-10-CM

## 2024-04-22 ENCOUNTER — Other Ambulatory Visit (HOSPITAL_COMMUNITY)

## 2024-04-22 ENCOUNTER — Inpatient Hospital Stay: Attending: Hematology | Admitting: Oncology

## 2024-04-22 ENCOUNTER — Inpatient Hospital Stay

## 2024-04-22 DIAGNOSIS — C7951 Secondary malignant neoplasm of bone: Secondary | ICD-10-CM | POA: Insufficient documentation

## 2024-04-22 DIAGNOSIS — R600 Localized edema: Secondary | ICD-10-CM | POA: Insufficient documentation

## 2024-04-22 DIAGNOSIS — D61818 Other pancytopenia: Secondary | ICD-10-CM | POA: Diagnosis not present

## 2024-04-22 DIAGNOSIS — C4371 Malignant melanoma of right lower limb, including hip: Secondary | ICD-10-CM | POA: Insufficient documentation

## 2024-04-22 DIAGNOSIS — E032 Hypothyroidism due to medicaments and other exogenous substances: Secondary | ICD-10-CM | POA: Diagnosis not present

## 2024-04-22 DIAGNOSIS — I514 Myocarditis, unspecified: Secondary | ICD-10-CM | POA: Insufficient documentation

## 2024-04-22 DIAGNOSIS — D696 Thrombocytopenia, unspecified: Secondary | ICD-10-CM | POA: Insufficient documentation

## 2024-04-22 DIAGNOSIS — R21 Rash and other nonspecific skin eruption: Secondary | ICD-10-CM | POA: Diagnosis not present

## 2024-04-22 DIAGNOSIS — E538 Deficiency of other specified B group vitamins: Secondary | ICD-10-CM | POA: Diagnosis not present

## 2024-04-22 DIAGNOSIS — R748 Abnormal levels of other serum enzymes: Secondary | ICD-10-CM | POA: Diagnosis not present

## 2024-04-22 DIAGNOSIS — Z79899 Other long term (current) drug therapy: Secondary | ICD-10-CM | POA: Insufficient documentation

## 2024-04-22 DIAGNOSIS — M25551 Pain in right hip: Secondary | ICD-10-CM | POA: Insufficient documentation

## 2024-04-22 DIAGNOSIS — D709 Neutropenia, unspecified: Secondary | ICD-10-CM | POA: Diagnosis not present

## 2024-04-22 LAB — CBC WITH DIFFERENTIAL/PLATELET
Abs Immature Granulocytes: 0.01 K/uL (ref 0.00–0.07)
Basophils Absolute: 0 K/uL (ref 0.0–0.1)
Basophils Relative: 1 %
Eosinophils Absolute: 0.1 K/uL (ref 0.0–0.5)
Eosinophils Relative: 3 %
HCT: 33.5 % — ABNORMAL LOW (ref 36.0–46.0)
Hemoglobin: 10.6 g/dL — ABNORMAL LOW (ref 12.0–15.0)
Immature Granulocytes: 0 %
Lymphocytes Relative: 25 %
Lymphs Abs: 0.9 K/uL (ref 0.7–4.0)
MCH: 30.6 pg (ref 26.0–34.0)
MCHC: 31.6 g/dL (ref 30.0–36.0)
MCV: 96.8 fL (ref 80.0–100.0)
Monocytes Absolute: 0.3 K/uL (ref 0.1–1.0)
Monocytes Relative: 8 %
Neutro Abs: 2.3 K/uL (ref 1.7–7.7)
Neutrophils Relative %: 63 %
Platelets: 141 K/uL — ABNORMAL LOW (ref 150–400)
RBC: 3.46 MIL/uL — ABNORMAL LOW (ref 3.87–5.11)
RDW: 19.6 % — ABNORMAL HIGH (ref 11.5–15.5)
WBC: 3.7 K/uL — ABNORMAL LOW (ref 4.0–10.5)
nRBC: 0 % (ref 0.0–0.2)

## 2024-04-22 MED ORDER — SULFAMETHOXAZOLE-TRIMETHOPRIM 800-160 MG PO TABS: 1.0000 | ORAL_TABLET | Freq: Two times a day (BID) | ORAL | 0 refills | Status: DC

## 2024-04-22 NOTE — Progress Notes (Signed)
 Patient presents today for port flush, labs and follow up with J.Burns NP. Vital signs stable. Elizabeth Norman presented for Portacath access and flush.  Portacath located right chest wall accessed with  H 20 needle.  Good blood return present. Portacath flushed with 20ml normal saline and needle removed intact.  Procedure tolerated well and without incident. Treatment Discharged from clinic ambulatory in stable condition. Alert and oriented x 3. F/U with [facility] as scheduled.

## 2024-04-22 NOTE — Assessment & Plan Note (Addendum)
 Patient is concerned for infection. She is currently on Lasix  20 mg for leg swelling which is not helping.  She was instructed to wear compression stockings and elevate legs when sitting. Right leg is more swollen than left leg on exam.  She is having mild discomfort behind her right calf and knee.  She has several open areas on both legs that do not appear to be infected. We discussed stopping Mektovi  30 mg twice per day x 1 week to see if this helps her symptoms.  I would also like to get an ultrasound of both of her lower extremities given extensive swelling is present. She also has some areas on bilateral upper extremities that are concerning for infection. Would recommend Bactrim twice daily x 5 days and continue topical cream as well. Return to clinic in 1 week for follow-up.

## 2024-04-22 NOTE — Progress Notes (Signed)
 [facility] OFFICE PROGRESS NOTE  Elizabeth Norman LABOR, MD  ASSESSMENT & PLAN:    Assessment & Plan Bilateral lower extremity edema Patient is concerned for infection. She is currently on Lasix  20 mg for leg swelling which is not helping.  She was instructed to wear compression stockings and elevate legs when sitting. Right leg is more swollen than left leg on exam.  She is having mild discomfort behind her right calf and knee.  She has several open areas on both legs that do not appear to be infected. We discussed stopping Mektovi  30 mg twice per day x 1 week to see if this helps her symptoms.  I would also like to get an ultrasound of both of her lower extremities given extensive swelling is present. She also has some areas on bilateral upper extremities that are concerning for infection. Would recommend Bactrim twice daily x 5 days and continue topical cream as well. Return to clinic in 1 week for follow-up.   Orders Placed This Encounter  Procedures   US  Venous Img Lower Bilateral    Standing Status:   Future    Expected Date:   04/29/2024    Expiration Date:   04/22/2025    Reason for Exam (SYMPTOM  OR DIAGNOSIS REQUIRED):   Lower extremity swelling R>L    Preferred imaging location?:   [facility]    INTERVAL HISTORY: Patient returns for an acute visit for lower extremity swelling and pain.   Patient is currently followed by Dr. Jillian for malignant melanoma of right lower leg.  She is currently on fourth line therapy with binimetinib  30 mg twice daily.  She has chronic erythematous papular rash around her mouth and face secondary to medication.  She has some immunotherapy induced hypothyroidism currently on levothyroxine  intermittent pancytopenia.  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  Labs from 04/06/2024 showed a low albumin, elevated AST, elevated alkaline phosphatase, anemia 10.9, neutropenia white count 3.5  and thrombocytopenia with platelets of 139.  Differential was unremarkable.  Chronic stable for her.  Today, reports ever since she has started Mektovi  she has developed severe skin issues starting with her face and now have popped up all over.  Reports her right leg is more swollen than her left leg and she has developed some pain behind her right knee and calf.  Left leg does not have any pain.  She has small sores the size of a dime on both legs that do not appear infected.  Additionally, she has several areas on her bilateral upper extremities right arm worse than left arm and erythema and warmth especially on her right hand.  Symptoms have progressively worsened over the past couple of weeks and she has been using the cream which has not been helping.  We reviewed CBC.  SUMMARY OF HEMATOLOGIC HISTORY: Oncology History  Malignant melanoma of lower leg, right (HCC)  03/27/2021 Initial Diagnosis   Malignant melanoma of lower leg, right (HCC)   03/27/2021 Cancer Staging   Staging form: Melanoma of the Skin, AJCC 8th Edition - Clinical stage from 03/27/2021: Stage IV (cT4a, cN3, cM1) - Signed by Davonna Siad, MD on 02/03/2024 Stage prefix: Initial diagnosis   05/09/2021 - 05/09/2021 Chemotherapy   Patient is on Treatment Plan : MELANOMA - Opdualag  (nivolumab /relatlimab ) q28d     09/03/2021 - 01/07/2022 Chemotherapy   Patient is on Treatment Plan : MELANOMA Pembrolizumab  (200) q21d     09/03/2021 - 01/13/2024 Chemotherapy  Patient is on Treatment Plan : MELANOMA Pembrolizumab  (200) + Yervoy  (1mg /kg) q21d        CBC    Component Value Date/Time   WBC 3.5 (L) 04/06/2024 0905   RBC 3.72 (L) 04/06/2024 0905   HGB 10.9 (L) 04/06/2024 0905   HCT 35.0 (L) 04/06/2024 0905   PLT 139 (L) 04/06/2024 0905   MCV 94.1 04/06/2024 0905   MCH 29.3 04/06/2024 0905   MCHC 31.1 04/06/2024 0905   RDW 18.4 (H) 04/06/2024 0905   LYMPHSABS 0.7 04/06/2024 0905   MONOABS 0.3 04/06/2024 0905   EOSABS  0.1 04/06/2024 0905   BASOSABS 0.0 04/06/2024 0905       Latest Ref Rng & Units 04/06/2024    9:05 AM 03/04/2024    9:49 AM 02/24/2024    9:22 AM  CMP  Glucose 70 - 99 mg/dL 895  893  895   BUN 8 - 23 mg/dL 10  13  16    Creatinine 0.44 - 1.00 mg/dL 9.20  9.18  8.93   Sodium 135 - 145 mmol/L 137  134  138   Potassium 3.5 - 5.1 mmol/L 4.1  4.3  4.9   Chloride 98 - 111 mmol/L 101  98  101   CO2 22 - 32 mmol/L 27  28  29    Calcium  8.9 - 10.3 mg/dL 8.7  8.4  9.0   Total Protein 6.5 - 8.1 g/dL 5.9  6.2  6.1   Total Bilirubin 0.0 - 1.2 mg/dL 0.6  0.4  0.3   Alkaline Phos 38 - 126 U/L 145  173  155   AST 15 - 41 U/L 68  61  53   ALT 0 - 44 U/L 27  25  19       Lab Results  Component Value Date   FERRITIN 68 04/06/2024   VITAMINB12 294 04/06/2024    There were no vitals filed for this visit.  Review of System:  Review of Systems  Cardiovascular:  Positive for leg swelling.  Skin:  Positive for itching and rash.    Physical Exam: Physical Exam Constitutional:      Appearance: Normal appearance.  HENT:     Head: Normocephalic and atraumatic.  Eyes:     Pupils: Pupils are equal, round, and reactive to light.  Cardiovascular:     Rate and Rhythm: Normal rate and regular rhythm.     Heart sounds: Normal heart sounds. No murmur heard.    Comments: See images below. Pulmonary:     Effort: Pulmonary effort is normal.     Breath sounds: Normal breath sounds. No wheezing.  Abdominal:     General: Bowel sounds are normal. There is no distension.     Palpations: Abdomen is soft.     Tenderness: There is no abdominal tenderness.  Musculoskeletal:        General: Normal range of motion.     Cervical back: Normal range of motion.     Right lower leg: Edema present.     Left lower leg: Edema present.  Skin:    General: Skin is warm and dry.     Findings: No rash.     Comments: See images below.  Neurological:     Mental Status: She is alert and oriented to person, place,  and time.     Gait: Gait is intact.  Psychiatric:        Mood and Affect: Mood and affect normal.  Cognition and Memory: Memory normal.        Judgment: Judgment normal.      Media Information  Document Information     Media Information  Document Information     I spent 20 minutes dedicated to the care of this patient (face-to-face and non-face-to-face) on the date of the encounter to include what is described in the assessment and plan.,  Delon Hope, NP 04/22/2024 12:30 PM

## 2024-04-25 ENCOUNTER — Ambulatory Visit (HOSPITAL_COMMUNITY)
Admission: RE | Admit: 2024-04-25 | Discharge: 2024-04-25 | Disposition: A | Source: Ambulatory Visit | Attending: Oncology | Admitting: Oncology

## 2024-04-25 DIAGNOSIS — R600 Localized edema: Secondary | ICD-10-CM | POA: Diagnosis present

## 2024-04-26 ENCOUNTER — Ambulatory Visit: Payer: Self-pay | Admitting: Oncology

## 2024-04-26 NOTE — Progress Notes (Signed)
 Can you let her know that her ultrasound of her lower extremities was negative for a DVT. \ Delon Hope, AGNP-C Department of Hematology/Oncology [facility] at [facility]  Phone: [phone]  04/26/2024 7:27 AM

## 2024-04-26 NOTE — Progress Notes (Signed)
Attempted to contact. No option to leave message

## 2024-04-27 NOTE — Progress Notes (Signed)
 Patient aware and verbalized understanding.

## 2024-05-01 NOTE — Progress Notes (Unsigned)
 " Patient Care Team: [name] LABOR, MD as PCP - General (Internal Medicine) Elmira Newman PARAS, MD as PCP - Cardiology (Cardiology) Celestia Joesph SQUIBB, RN as Oncology Nurse Navigator (Oncology)  Clinic Day:  05/02/2024  Referring physician: [name] LABOR, MD   CHIEF COMPLAINT:  CC: Metastatic malignant melanoma   ASSESSMENT & PLAN:   Assessment & Plan: Elizabeth Norman  is a 83 y.o. female with metastatic malignant melanoma  Malignant melanoma of the lower leg, right  Metastatic malignant melanoma initially diagnosed in 2022.  Extensive oncology history below S/p wide local excision and lymph node biopsy.  Adjuvant therapy with Opdualag  resulting in myocarditis requiring hospital admission and hence was discontinued Received radiation to femoral lesion and inguinal lymph node. NGS consistent with negative BRAF, positive NRAS and positive PTEN Second line: Pembrolizumab  Third line: Ipilimumab  added for progression along with pembrolizumab . Patient was started on binimetinib  30mg  twice daily on 02/03/2024 and PET scan after that showed good response.   - Patient presented 2 weeks ago with diffuse edema of bilateral legs and arms.  Held Binimetinib  at that time with improvement in symptoms. - Patient also had perioral rash that improved with holding binimetinib . -Recommended continuing to hold binimetinib  and reassess in 2 weeks. - Will also obtain echocardiogram of heart to rule out cardiomyopathy - Can restart Binimetinib  at a lower dose after reassessment. - Reemphasized side effects of Binimetinib -gastrointestinal symptoms (nausea, diarrhea, vomiting), fatigue, rash, and muscle-related issues like myalgia or elevated creatine phosphokinase. Serious but less frequent risks include ocular toxicities (e.g. serous retinopathy), liver enzyme elevations, and potential cardiomyopathy or cardiovascular dysfunction. - Continue binimetinib  30 mg twice daily.  Can dose adjust based on her  tolerance and response. - Will repeat PET scan in 3 months  Return to clinic in 2 weeks to discuss results and further management  Right hip pain Likely secondary to the femoral lesion  - Will reach out to Dr. Dannielle to see if this area can be radiated considering previous radiation.  Rash Erythematous papular rash around mouth and face. Likely secondary to binimetinib  Improved with holding binimetinib   - Prescribed triamcinolone  0.1% to be used twice daily as needed.   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  She knows to contact our office if she develops concerns prior to her next appointment.  The total time spent in the appointment was 25 minutes for the encounter with patient, including review of chart and various tests results, discussions about plan of care and coordination of care plan   Elizabeth Dry, MD  [facility] - A DEPT OF [facility] [address] [place] [address] Dept: [phone] Dept Fax: [phone]   Orders Placed This Encounter  Procedures   CBC with Differential/Platelet  Standing Status:   Future    Expected Date:   05/17/2024    Expiration Date:    08/15/2024   Comprehensive metabolic panel with GFR    Standing Status:   Future    Expected Date:   05/17/2024    Expiration Date:   08/15/2024   Ferritin    Standing Status:   Future    Expected Date:   05/17/2024    Expiration Date:   08/15/2024   Folate    Standing Status:   Future    Expected Date:   05/17/2024    Expiration Date:   08/15/2024   Vitamin B12    Standing Status:   Future    Expected Date:   05/17/2024    Expiration Date:   08/15/2024   Iron and TIBC    Standing Status:   Future    Expected Date:   05/17/2024    Expiration Date:   08/15/2024   TSH    Standing Status:   Future    Expected Date:   05/17/2024    Expiration Date:   08/15/2024   T4, free    Standing Status:   Future    Expected Date:   05/17/2024    Expiration Date:   08/15/2024   ECHOCARDIOGRAM COMPLETE    Standing Status:   Future    Expected Date:   05/09/2024    Expiration Date:   05/02/2025    Where should this test be performed:   [name]    Perflutren  DEFINITY  (image enhancing agent) should be administered unless hypersensitivity or allergy exist:   Administer Perflutren     Reason for exam-Echo:   Other-Full Diagnosis List    Full ICD-10/Reason for Exam:   Pedal edema [[id number]]     ONCOLOGY HISTORY:   I have reviewed her chart and materials related to her cancer extensively and collaborated history with the patient. Summary of oncologic history is as follows:   Diagnosis: Malignant melanoma of the right posterior leg (pT4 pN3 M1), BRAF V600 negative   -Presentation: fleshy lesion on right leg -12/2020: Right lower leg lesion: Biopsy: Malignant melanoma, nodular subtype, Breslow thickness 9.52 mm, no satellitosis, no ulceration, no LVI.  Deep margin was positive.  There were 2 mitosis per millimeter squared.  Path was pT4a. ( Per documentation) -02/12/2021: Wide local lesion excision and lymph node biopsy.  Pathology: Residual malignant melanoma, 7.0 mm. Margins free of melanoma. No lymphovascular invasion  present. Three of three positive sentinel lymph nodes for melanoma (3/3). Pathologic staging: pT4a, pN3. MelanA and Sox-10 highlight the melanoma.  -03/22/2021: Initial PET: Three foci of intense metabolic activity within the RIGHT lower extremity consistent with metastatic melanoma. Lesions involve the subcutaneous tissue, intramedullary bone of the RIGHT femur as well as soft tissue activity in the RIGHT condylar notch. Focus of metabolic activity adjacent to the RIGHT iliac wing is indeterminate differential including metastatic melanoma versus trauma. No evidence of visceral metastasis in the chest abdomen pelvis. Focus of intense radiotracer activity at the angle of the RIGHT jaw is favored to localize within the RIGHT parotid gland. Favor primary parotid neoplasm. -04/05/2021: Port insertion -04/10/2021: MRI brain: No intracranial metastatic disease. No acute intracranial abnormality. -04/15/2021: MRI of right hip and right knee: Solitary osseous metastasis posteriorly in the distal right femoral diaphysis. Multiple nodal metastases anteromedially in the proximal to mid right thigh. The hypermetabolic activity in the right knee intercondylar notch on prior PET-CT shows no clear corresponding enhancing lesion  and may relate to a small synovial cyst or inflammation. -04/19/2021:[facility] NGS: BRAF negative, positive for NRAS pathogenic variant exon 3, TERT promoter.  MSI-stable.  MMR-proficient.  NTRK 1/2/3 fusion not detected.  KIT mutation negative.  PD-L1 negative.  -05/09/2021: Nivolumab -Relatlimab  (Opdualag ) every 28 days, discontinued after first dose due to myocarditis -05/2021-07/2021: Off treatment -07/25/2021: PET: Hypermetabolic lymph nodes in the LEFT groin adjacent to lymphadenectomy clips consistent with melanoma recurrence. Hypermetabolic soft tissue nodule in the subcutaneous tissue of the medial RIGHT thigh consistent with melanoma. Hypermetabolic nodule within the medullary space of the  midshaft RIGHT femur consistent skeletal metastasis. No evidence of metastatic melanoma above the RIGHT groin lesions. -09/03/2021-01/13/2023: Pembrolizumab    -02/13/2022: PET:  Interval recurrence of FDG avid lesion within the mid shaft of the right femur. Interval increase in size and degree of FDG uptake associated with soft tissue nodule within the medial right thigh. Similar appearance of tracer avid nodule within the deep right parotid gland. This is favored to represent a primary parotid neoplasm. -03/26/2022-04/09/2022: Radiation therapy to soft tissue nodule within right medial thigh and midshaft right femoral lesion  -12/2022: Guardant360: NRAS Q61R (? Binimetinib ), TP 53 C17 6Y, TERT promoter SNV, TMB 20.8, MSI high not detected.  -01/01/2023: PET: Postsurgical changes in the medial right calf. Right femoral osseous metastasis and metastatic lesion/node in the medial right thigh, grossly unchanged. New right pelvic nodal metastases, reflecting progression of disease. -01/14/2023-current: Pembrolizumab  with low-dose ipilimumab   -05/22/2023: PET: There are 2 soft tissue nodules within the right upper quadrant of the abdomen which are tracer avid, new from the previous PET-CT and new from the CT from 03/30/2023. Indeterminate. Peritoneal metastasis cannot be excluded. Multiple tracer avid right-sided pelvic lymph nodes are identified compatible with nodal metastasis. These are new compared with the previous PET-CT from 01/01/2023. There is a new focal area of increased radiotracer uptake within the anterior inferior aspect of the L3 vertebral body. No corresponding CT abnormality identified. Findings are concerning for osseous metastasis. Stable appearance of tracer avid lesion within the distal diaphysis of the right femur. Stable appearance of soft tissue nodule within the medial right thigh at the site of treated melanoma. -06/09/2023: Guardant360: NRAS Q61R (binimetinib ), PTEN deletion, exon 3-9  (Capivasertib), PTEN deletion exon 1-6 (Capivasertib), TP 53, TERT promoter SNV  -07/23/2023: PET: Somewhat mixed response to therapy with enlarging and increasingly hypermetabolic right inguinal adenopathy and a new L5 metastasis. Stable or decreased hypermetabolism involving right upper quadrant peritoneal nodules, right iliac chain adenopathy, L5 and right femoral metastases and a treated soft tissue lesion in the medial right thigh. Decreasing hypermetabolism in the gallbladder fossa, possibly postoperative in etiology. Hypermetabolic medial right parotid nodule, stable.  -08/31/2023: Radiation therapy to right inguinal lymph node -01/28/2024: PET: Mixed response to therapy. Interval increase in size and metabolic activity of lesion in the proximal RIGHT humerus. Interval increase in size and metabolic activity of lesion in the proximal RIGHT femur. New hypermetabolic lesions in the L1 and T11 vertebral bodies. Stable hypermetabolic lesion in the L3 vertebral body. Stable hypermetabolic lesion in the RIGHT chest wall. Stable hypermetabolic lesion in the RIGHT external iliac and inguinal lymph nodes. Stable hypermetabolic lesion in the RIGHT medial thigh. Decrease in metabolic activity of gallbladder lesion. - 02/03/2024-current: Binimetinib  30 mg twice daily -03/31/2024: PET: Interval response to therapy with decreased metabolic activity associated with previously demonstrated osseous and soft tissue metastases. There is persistent intense hypermetabolic activity within the distal right femoral lesion. No definite new or  enlarging metastases identified. New prominent vascular activity within the left brachial, axillary and subclavian veins with a new nearby focal hypermetabolic activity inferiorly in the left axilla, favored to reflect a venous collateral (rather than a lymph node).  Current Treatment:  Binimetinib  30mg  twice daily  INTERVAL HISTORY:   Discussed the use of AI scribe software for  clinical note transcription with the patient, who gave verbal consent to proceed.  History of Present Illness Elizabeth Norman is an 83 year old female with metastatic malignant melanoma with bone metastases who presents for evaluation of adverse effects from oral chemotherapy.  She is being accompanied by her son today.  She has metastatic melanoma with osseous involvement, including a painful right hip lesion previously treated with palliative radiation. Disease remained stable for nearly three years on pembrolizumab  with minimal toxicity, followed by ipilimumab , and more recently transitioned to an oral chemotherapy agent. She experienced a favorable initial response to the oral agent, but after two months developed significant adverse effects.  Adverse effects included generalized edema, pruritus, and mucosal involvement. Oral swelling was most pronounced, with episodes of oral bleeding during meals requiring frequent wiping. She also experienced generalized swelling, with weight decreasing from 179 to 163 pounds after discontinuing the oral chemotherapy. Oral lesions resolved with topical therapy, and she currently denies oral swelling or bleeding. Pruritus persists, and she describes intermittent right-sided pain, which she attributes to local irritation. The right hip lesion continues to cause intermittent pain.  Bilateral lower extremity ultrasounds were negative for deep vein thrombosis. She denies new or worsening symptoms since discontinuing the oral chemotherapy and overall reports improvement. She continues vitamin B12 and thyroid  supplementation. Cytopenias persist. She is not consistently using compression stockings or leg elevation.    I have reviewed the past medical history, past surgical history, social history and family history with the patient and they are unchanged from previous note.  ALLERGIES:  is allergic to tape.  MEDICATIONS:  Current Outpatient Medications  Medication  Sig Dispense Refill   ALPRAZolam  (XANAX ) 0.5 MG tablet Take 0.5 mg by mouth 3 (three) times daily.  2   binimetinib  (MEKTOVI ) 15 MG tablet Take 2 tablets (30 mg total) by mouth 2 (two) times daily. 120 tablet 2   calcium  carbonate (OSCAL) 1500 (600 Ca) MG TABS tablet Take 600 mg of elemental calcium  by mouth 3 (three) times a week.     cholecalciferol (VITAMIN D) 1000 UNITS tablet Take 1,000 Units by mouth 3 (three) times a week.     Cyanocobalamin  (VITAMIN B-12 PO) Take 1 tablet by mouth once a week.     diphenhydramine -acetaminophen  (TYLENOL  PM) 25-500 MG TABS tablet Take 1 tablet by mouth at bedtime as needed.     furosemide  (LASIX ) 20 MG tablet Take 1 tablet (20 mg total) by mouth daily for 5 days. 30 tablet 0   levothyroxine  (SYNTHROID ) 125 MCG tablet Take 1 tablet (125 mcg total) by mouth daily before breakfast. 30 tablet 3   metoprolol  tartrate (LOPRESSOR ) 25 MG tablet Take 1 tablet (25 mg total) by mouth 2 (two) times daily. Hold until follow-up with your doctor/cardiologist     miconazole (MICOTIN) 2 % cream Apply 1 Application topically 2 (two) times daily.     ondansetron  (ZOFRAN -ODT) 4 MG disintegrating tablet Take 1 tablet (4 mg total) by mouth every 6 (six) hours as needed for nausea. 20 tablet 0   prednisoLONE  acetate (PRED FORTE ) 1 % ophthalmic suspension Place 1 drop into both eyes daily.  prochlorperazine  (COMPAZINE ) 10 MG tablet Take 1 tablet (10 mg total) by mouth every 6 (six) hours as needed for nausea or vomiting. 90 tablet 3   triamcinolone  cream (KENALOG ) 0.1 % Apply 1 Application topically 2 (two) times daily. 60 g 10   No current facility-administered medications for this visit.    REVIEW OF SYSTEMS:   All the systems were reviewed with the patient and are negative except HPI   VITALS:  There were no vitals taken for this visit.  Wt Readings from Last 3 Encounters:  05/02/24 167 lb 15.9 oz (76.2 kg)  04/22/24 173 lb 8 oz (78.7 kg)  04/06/24 173 lb (78.5 kg)     There is no height or weight on file to calculate BMI.  Performance status (ECOG): 2 - Symptomatic, <50% confined to bed  PHYSICAL EXAM:   GENERAL:alert, no distress and comfortable SKIN: Erythematous rash on bilateral cheeks, around mouth and some on lips.  Very sparse on arms. LYMPH: Palpable right inguinal lymphadenopathy. LUNGS: clear to auscultation and percussion with normal breathing effort HEART: regular rate & rhythm and no murmurs and no lower extremity edema ABDOMEN:abdomen soft, non-tender and normal bowel sounds Musculoskeletal: Induration in the right thigh 5 into 3 cm in diameter  NEURO: alert & oriented x 3 with fluent speech  LABORATORY DATA:  I have reviewed the data as listed   Lab Results  Component Value Date   WBC 2.9 (L) 05/02/2024   NEUTROABS 2.0 05/02/2024   HGB 9.8 (L) 05/02/2024   HCT 31.1 (L) 05/02/2024   MCV 97.5 05/02/2024   PLT 137 (L) 05/02/2024      Chemistry      Component Value Date/Time   NA 140 05/02/2024 0755   K 3.9 05/02/2024 0755   CL 104 05/02/2024 0755   CO2 24 05/02/2024 0755   BUN 13 05/02/2024 0755   CREATININE 0.74 05/02/2024 0755      Component Value Date/Time   CALCIUM  8.6 (L) 05/02/2024 0755   ALKPHOS 134 (H) 05/02/2024 0755   AST 27 05/02/2024 0755   ALT 14 05/02/2024 0755   BILITOT 0.4 05/02/2024 0755      Latest Reference Range & Units 04/06/24 09:05  Iron 28 - 170 ug/dL 67  UIBC ug/dL 765  TIBC 749 - 549 ug/dL 698  Saturation Ratios 10.4 - 31.8 % 22  Ferritin 11 - 307 ng/mL 68  Folate >5.9 ng/mL 15.2  Vitamin B12 180 - 914 pg/mL 294    RADIOGRAPHIC STUDIES: I have personally reviewed the radiological images as listed and agreed with the findings in the report.  US  Venous Img Lower Bilateral CLINICAL DATA:  Bilateral lower extremity edema worse on the right than the left  EXAM: BILATERAL LOWER EXTREMITY VENOUS DOPPLER ULTRASOUND  TECHNIQUE: Gray-scale sonography with graded compression, as  well as color Doppler and duplex ultrasound were performed to evaluate the lower extremity deep venous systems from the level of the common femoral vein and including the common femoral, femoral, profunda femoral, popliteal and calf veins including the posterior tibial, peroneal and gastrocnemius veins when visible. The superficial great saphenous vein was also interrogated. Spectral Doppler was utilized to evaluate flow at rest and with distal augmentation maneuvers in the common femoral, femoral and popliteal veins.  COMPARISON:  None Available.  FINDINGS: RIGHT LOWER EXTREMITY  Common Femoral Vein: No evidence of thrombus. Normal compressibility, respiratory phasicity and response to augmentation.  Saphenofemoral Junction: No evidence of thrombus. Normal compressibility and flow on  color Doppler imaging.  Profunda Femoral Vein: No evidence of thrombus. Normal compressibility and flow on color Doppler imaging.  Femoral Vein: No evidence of thrombus. Normal compressibility, respiratory phasicity and response to augmentation.  Popliteal Vein: No evidence of thrombus. Normal compressibility, respiratory phasicity and response to augmentation.  Calf Veins: No evidence of thrombus. Normal compressibility and flow on color Doppler imaging.  Superficial Great Saphenous Vein: No evidence of thrombus. Normal compressibility.  Venous Reflux:  None.  Other Findings:  None.  LEFT LOWER EXTREMITY  Common Femoral Vein: No evidence of thrombus. Normal compressibility, respiratory phasicity and response to augmentation.  Saphenofemoral Junction: No evidence of thrombus. Normal compressibility and flow on color Doppler imaging.  Profunda Femoral Vein: No evidence of thrombus. Normal compressibility and flow on color Doppler imaging.  Femoral Vein: No evidence of thrombus. Normal compressibility, respiratory phasicity and response to augmentation.  Popliteal Vein: No evidence of  thrombus. Normal compressibility, respiratory phasicity and response to augmentation.  Calf Veins: No evidence of thrombus. Normal compressibility and flow on color Doppler imaging.  Superficial Great Saphenous Vein: No evidence of thrombus. Normal compressibility.  Venous Reflux:  None.  Other Findings:  None.  IMPRESSION: No evidence of deep venous thrombosis in either lower extremity.  Electronically Signed   By: Wilkie Lent M.D.   On: 04/25/2024 16:08    "

## 2024-05-02 ENCOUNTER — Inpatient Hospital Stay: Admitting: Oncology

## 2024-05-02 ENCOUNTER — Inpatient Hospital Stay

## 2024-05-02 VITALS — BP 138/66 | HR 92 | Temp 96.4°F | Resp 20 | Wt 168.0 lb

## 2024-05-02 DIAGNOSIS — E538 Deficiency of other specified B group vitamins: Secondary | ICD-10-CM

## 2024-05-02 DIAGNOSIS — I514 Myocarditis, unspecified: Secondary | ICD-10-CM

## 2024-05-02 DIAGNOSIS — C4371 Malignant melanoma of right lower limb, including hip: Secondary | ICD-10-CM

## 2024-05-02 DIAGNOSIS — T50905A Adverse effect of unspecified drugs, medicaments and biological substances, initial encounter: Secondary | ICD-10-CM

## 2024-05-02 DIAGNOSIS — D61818 Other pancytopenia: Secondary | ICD-10-CM

## 2024-05-02 DIAGNOSIS — R600 Localized edema: Secondary | ICD-10-CM

## 2024-05-02 DIAGNOSIS — E032 Hypothyroidism due to medicaments and other exogenous substances: Secondary | ICD-10-CM

## 2024-05-02 DIAGNOSIS — R21 Rash and other nonspecific skin eruption: Secondary | ICD-10-CM | POA: Diagnosis not present

## 2024-05-02 LAB — COMPREHENSIVE METABOLIC PANEL WITH GFR
ALT: 14 U/L (ref 0–44)
AST: 27 U/L (ref 15–41)
Albumin: 3.5 g/dL (ref 3.5–5.0)
Alkaline Phosphatase: 134 U/L — ABNORMAL HIGH (ref 38–126)
Anion gap: 12 (ref 5–15)
BUN: 13 mg/dL (ref 8–23)
CO2: 24 mmol/L (ref 22–32)
Calcium: 8.6 mg/dL — ABNORMAL LOW (ref 8.9–10.3)
Chloride: 104 mmol/L (ref 98–111)
Creatinine, Ser: 0.74 mg/dL (ref 0.44–1.00)
GFR, Estimated: >60 mL/min
Glucose, Bld: 138 mg/dL — ABNORMAL HIGH (ref 70–99)
Potassium: 3.9 mmol/L (ref 3.5–5.1)
Sodium: 140 mmol/L (ref 135–145)
Total Bilirubin: 0.4 mg/dL (ref 0.0–1.2)
Total Protein: 6.4 g/dL — ABNORMAL LOW (ref 6.5–8.1)

## 2024-05-02 LAB — CBC WITH DIFFERENTIAL/PLATELET
Abs Immature Granulocytes: 0.01 K/uL (ref 0.00–0.07)
Basophils Absolute: 0 K/uL (ref 0.0–0.1)
Basophils Relative: 1 %
Eosinophils Absolute: 0.1 K/uL (ref 0.0–0.5)
Eosinophils Relative: 4 %
HCT: 31.1 % — ABNORMAL LOW (ref 36.0–46.0)
Hemoglobin: 9.8 g/dL — ABNORMAL LOW (ref 12.0–15.0)
Immature Granulocytes: 0 %
Lymphocytes Relative: 14 %
Lymphs Abs: 0.4 K/uL — ABNORMAL LOW (ref 0.7–4.0)
MCH: 30.7 pg (ref 26.0–34.0)
MCHC: 31.5 g/dL (ref 30.0–36.0)
MCV: 97.5 fL (ref 80.0–100.0)
Monocytes Absolute: 0.3 K/uL (ref 0.1–1.0)
Monocytes Relative: 11 %
Neutro Abs: 2 K/uL (ref 1.7–7.7)
Neutrophils Relative %: 70 %
Platelets: 137 K/uL — ABNORMAL LOW (ref 150–400)
RBC: 3.19 MIL/uL — ABNORMAL LOW (ref 3.87–5.11)
RDW: 18.7 % — ABNORMAL HIGH (ref 11.5–15.5)
WBC: 2.9 K/uL — ABNORMAL LOW (ref 4.0–10.5)
nRBC: 0 % (ref 0.0–0.2)

## 2024-05-02 LAB — MAGNESIUM: Magnesium: 2.1 mg/dL (ref 1.7–2.4)

## 2024-05-02 NOTE — Progress Notes (Signed)
 Patient is taking Mektovi  as prescribed.  She has not missed any doses and reports no side effects at this time.

## 2024-05-02 NOTE — Patient Instructions (Addendum)
 [facility] at [facility] Discharge Instructions   You were seen and examined today by Dr. Davonna.  She reviewed the results of your lab work which are normal/stable.   Hold Mektovi .  We will proceed see you back in 2 weeks.   Return as scheduled.    Thank you for choosing [facility] at [facility] to provide your oncology and hematology care.  To afford each patient quality time with our provider, please arrive at least 15 minutes before your scheduled appointment time.   If you have a lab appointment with the Cancer Center please come in thru the Main Entrance and check in at the main information desk.  You need to re-schedule your appointment should you arrive 10 or more minutes late.  We strive to give you quality time with our providers, and arriving late affects you and other patients whose appointments are after yours.  Also, if you no show three or more times for appointments you may be dismissed from the clinic at the providers discretion.     Again, thank you for choosing [facility].  Our hope is that these requests will decrease the amount of time that you wait before being seen by our physicians.       _____________________________________________________________  Should you have questions after your visit to [facility], please contact our office at [phone] and follow the prompts.  Our office hours are 8:00 a.m. and 4:30 p.m. Monday - Friday.  Please note that voicemails left after 4:00 p.m. may not be returned until the following business day.  We are closed weekends and major holidays.  You do have access to a nurse 24-7, just call the main number to the clinic [phone] and do not press any options, hold on the line and a nurse will answer the phone.    For prescription refill requests, have your pharmacy contact our office and allow 72 hours.    Due to Covid, you will need to wear a mask  upon entering the hospital. If you do not have a mask, a mask will be given to you at the Main Entrance upon arrival. For doctor visits, patients may have 1 support person age 68 or older with them. For treatment visits, patients can not have anyone with them due to social distancing guidelines and our immunocompromised population.

## 2024-05-09 ENCOUNTER — Inpatient Hospital Stay: Admitting: Oncology

## 2024-05-09 ENCOUNTER — Encounter (HOSPITAL_COMMUNITY): Payer: Self-pay | Admitting: Oncology

## 2024-05-09 ENCOUNTER — Telehealth: Payer: Self-pay | Admitting: *Deleted

## 2024-05-09 ENCOUNTER — Encounter: Payer: Self-pay | Admitting: *Deleted

## 2024-05-09 VITALS — BP 135/66 | HR 105 | Temp 98.2°F | Resp 19 | Wt 167.0 lb

## 2024-05-09 DIAGNOSIS — E538 Deficiency of other specified B group vitamins: Secondary | ICD-10-CM

## 2024-05-09 DIAGNOSIS — I514 Myocarditis, unspecified: Secondary | ICD-10-CM | POA: Diagnosis not present

## 2024-05-09 DIAGNOSIS — D61818 Other pancytopenia: Secondary | ICD-10-CM | POA: Diagnosis not present

## 2024-05-09 DIAGNOSIS — R21 Rash and other nonspecific skin eruption: Secondary | ICD-10-CM | POA: Diagnosis not present

## 2024-05-09 DIAGNOSIS — C4371 Malignant melanoma of right lower limb, including hip: Secondary | ICD-10-CM

## 2024-05-09 DIAGNOSIS — M25551 Pain in right hip: Secondary | ICD-10-CM

## 2024-05-09 DIAGNOSIS — C774 Secondary and unspecified malignant neoplasm of inguinal and lower limb lymph nodes: Secondary | ICD-10-CM | POA: Diagnosis not present

## 2024-05-09 DIAGNOSIS — R600 Localized edema: Secondary | ICD-10-CM

## 2024-05-09 DIAGNOSIS — E032 Hypothyroidism due to medicaments and other exogenous substances: Secondary | ICD-10-CM | POA: Diagnosis not present

## 2024-05-09 MED ORDER — DOXYCYCLINE HYCLATE 100 MG PO TABS: 100.0000 mg | ORAL_TABLET | Freq: Two times a day (BID) | ORAL | 2 refills | Status: AC

## 2024-05-09 MED ORDER — IBUPROFEN 800 MG PO TABS: 800.0000 mg | ORAL_TABLET | Freq: Three times a day (TID) | ORAL | 0 refills | Status: DC | PRN

## 2024-05-09 MED ORDER — PREDNISONE 10 MG PO TABS: 10.0000 mg | ORAL_TABLET | Freq: Every day | ORAL | 0 refills | Status: DC

## 2024-05-09 NOTE — Telephone Encounter (Signed)
 Patient called after hours line to state that she is having severe itching. States that she has held her mektovi  for 3 weeks due to symptoms, however has not resolved.   Will bring in for follow up today per Dr. Davonna.

## 2024-05-12 ENCOUNTER — Encounter (HOSPITAL_COMMUNITY): Payer: Self-pay | Admitting: Oncology

## 2024-05-12 NOTE — Progress Notes (Signed)
 " Patient Care Team: [name] LABOR, MD as PCP - General (Internal Medicine) Elmira Newman PARAS, MD as PCP - Cardiology (Cardiology) Celestia Joesph SQUIBB, RN as Oncology Nurse Navigator (Oncology)  Clinic Day:  05/09/2024  Referring physician: [name] LABOR, MD   CHIEF COMPLAINT:  CC: Metastatic malignant melanoma   ASSESSMENT & PLAN:   Assessment & Plan: Elizabeth Norman  is a 84 y.o. female with metastatic malignant melanoma  Malignant melanoma of the lower leg, right  Metastatic malignant melanoma initially diagnosed in 2022.  Extensive oncology history below S/p wide local excision and lymph node biopsy.  Adjuvant therapy with Opdualag  resulting in myocarditis requiring hospital admission and hence was discontinued Received radiation to femoral lesion and inguinal lymph node. NGS consistent with negative BRAF, positive NRAS and positive PTEN Second line: Pembrolizumab  Third line: Ipilimumab  added for progression along with pembrolizumab . Patient was started on binimetinib  30mg  twice daily on 02/03/2024 and PET scan after that showed good response.   - Patient presented 3 weeks ago with diffuse edema of bilateral legs and arms.  Held Binimetinib  at that time with improvement in symptoms. - Patient also had perioral rash that improved with holding binimetinib . -Recommended continuing to hold binimetinib  and reassess in 2 weeks. - Echocardiogram of heart to rule out cardiomyopathy- pending at this time - Can restart Binimetinib  at a lower dose after reassessment. - Will repeat PET scan in 3 months  Return to clinic in 2 weeks to discuss results and further management  Rash Erythematous papular rash around mouth and face. Likely secondary to binimetinib  Improved with holding binimetinib   - Prescribed triamcinolone  0.1% to be used twice daily as needed. - Prescribed prednisone  10mg  daily -Prescribed doxycycline  100mg  twice daily  Right hip pain Likely secondary to the  femoral lesion  - Will reach out to Dr. Dannielle to see if this area can be radiated considering previous radiation.   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  She knows to contact our office if she develops concerns prior to her next appointment.  The total time spent in the appointment was 19 minutes for the encounter with patient, including review of chart and various tests results, discussions about plan of care and coordination of care plan   Mickiel Dry, MD  [facility] [place] - A DEPT OF [facility] [address] [place] [address] Dept: [phone] Dept Fax: [phone]   No orders of the defined types were placed in this encounter.    ONCOLOGY HISTORY:   I have reviewed her chart and materials related to her cancer extensively and collaborated history with the patient. Summary of oncologic history is as follows:   Diagnosis: Malignant melanoma of the right posterior leg (pT4 pN3 M1), BRAF V600  negative   -Presentation: fleshy lesion on right leg -12/2020: Right lower leg lesion: Biopsy: Malignant melanoma, nodular subtype, Breslow thickness 9.52 mm, no satellitosis, no ulceration, no  LVI.  Deep margin was positive.  There were 2 mitosis per millimeter squared.  Path was pT4a. ( Per documentation) -02/12/2021: Wide local lesion excision and lymph node biopsy.  Pathology: Residual malignant melanoma, 7.0 mm. Margins free of melanoma. No lymphovascular invasion present. Three of three positive sentinel lymph nodes for melanoma (3/3). Pathologic staging: pT4a, pN3. MelanA and Sox-10 highlight the melanoma.  -03/22/2021: Initial PET: Three foci of intense metabolic activity within the RIGHT lower extremity consistent with metastatic melanoma. Lesions involve the subcutaneous tissue, intramedullary bone of the RIGHT femur as well as soft tissue activity in the RIGHT condylar notch. Focus of metabolic activity adjacent to the RIGHT iliac wing is indeterminate differential including metastatic melanoma versus trauma. No evidence of visceral metastasis in the chest abdomen pelvis. Focus of intense radiotracer activity at the angle of the RIGHT jaw is favored to localize within the RIGHT parotid gland. Favor primary parotid neoplasm. -04/05/2021: Port insertion -04/10/2021: MRI brain: No intracranial metastatic disease. No acute intracranial abnormality. -04/15/2021: MRI of right hip and right knee: Solitary osseous metastasis posteriorly in the distal right femoral diaphysis. Multiple nodal metastases anteromedially in the proximal to mid right thigh. The hypermetabolic activity in the right knee intercondylar notch on prior PET-CT shows no clear corresponding enhancing lesion and may relate to a small synovial cyst or inflammation. -04/19/2021:[facility] NGS: BRAF negative, positive for NRAS pathogenic variant exon 3, TERT promoter.  MSI-stable.  MMR-proficient.  NTRK 1/2/3 fusion not detected.  KIT mutation negative.  PD-L1 negative.  -05/09/2021: Nivolumab -Relatlimab  (Opdualag ) every 28 days, discontinued after first dose due to myocarditis -05/2021-07/2021: Off treatment -07/25/2021: PET:  Hypermetabolic lymph nodes in the LEFT groin adjacent to lymphadenectomy clips consistent with melanoma recurrence. Hypermetabolic soft tissue nodule in the subcutaneous tissue of the medial RIGHT thigh consistent with melanoma. Hypermetabolic nodule within the medullary space of the midshaft RIGHT femur consistent skeletal metastasis. No evidence of metastatic melanoma above the RIGHT groin lesions. -09/03/2021-01/13/2023: Pembrolizumab    -02/13/2022: PET:  Interval recurrence of FDG avid lesion within the mid shaft of the right femur. Interval increase in size and degree of FDG uptake associated with soft tissue nodule within the medial right thigh. Similar appearance of tracer avid nodule within the deep right parotid gland. This is favored to represent a primary parotid neoplasm. -03/26/2022-04/09/2022: Radiation therapy to soft tissue nodule within right medial thigh and midshaft right femoral lesion  -12/2022: Guardant360: NRAS Q61R (? Binimetinib ), TP 53 C17 6Y, TERT promoter SNV, TMB 20.8, MSI high not detected.  -01/01/2023: PET: Postsurgical changes in the medial right calf. Right femoral osseous metastasis and metastatic lesion/node in the medial right thigh, grossly unchanged. New right pelvic nodal metastases, reflecting progression of disease. -01/14/2023-current: Pembrolizumab  with low-dose ipilimumab   -05/22/2023: PET: There are 2 soft tissue nodules within the right upper quadrant of the abdomen which are tracer avid, new from the previous PET-CT and new from the CT from 03/30/2023. Indeterminate. Peritoneal metastasis cannot be excluded. Multiple tracer avid right-sided pelvic lymph nodes are identified compatible with nodal metastasis. These are new compared with the previous PET-CT from 01/01/2023. There is a new focal area of increased radiotracer uptake within the anterior inferior aspect of the L3 vertebral body. No corresponding CT abnormality identified. Findings are concerning for  osseous metastasis. Stable appearance of tracer avid lesion within  the distal diaphysis of the right femur. Stable appearance of soft tissue nodule within the medial right thigh at the site of treated melanoma. -06/09/2023: Guardant360: NRAS Q61R (binimetinib ), PTEN deletion, exon 3-9 (Capivasertib), PTEN deletion exon 1-6 (Capivasertib), TP 53, TERT promoter SNV  -07/23/2023: PET: Somewhat mixed response to therapy with enlarging and increasingly hypermetabolic right inguinal adenopathy and a new L5 metastasis. Stable or decreased hypermetabolism involving right upper quadrant peritoneal nodules, right iliac chain adenopathy, L5 and right femoral metastases and a treated soft tissue lesion in the medial right thigh. Decreasing hypermetabolism in the gallbladder fossa, possibly postoperative in etiology. Hypermetabolic medial right parotid nodule, stable.  -08/31/2023: Radiation therapy to right inguinal lymph node -01/28/2024: PET: Mixed response to therapy. Interval increase in size and metabolic activity of lesion in the proximal RIGHT humerus. Interval increase in size and metabolic activity of lesion in the proximal RIGHT femur. New hypermetabolic lesions in the L1 and T11 vertebral bodies. Stable hypermetabolic lesion in the L3 vertebral body. Stable hypermetabolic lesion in the RIGHT chest wall. Stable hypermetabolic lesion in the RIGHT external iliac and inguinal lymph nodes. Stable hypermetabolic lesion in the RIGHT medial thigh. Decrease in metabolic activity of gallbladder lesion. - 02/03/2024-current: Binimetinib  30 mg twice daily -03/31/2024: PET: Interval response to therapy with decreased metabolic activity associated with previously demonstrated osseous and soft tissue metastases. There is persistent intense hypermetabolic activity within the distal right femoral lesion. No definite new or enlarging metastases identified. New prominent vascular activity within the left brachial, axillary and  subclavian veins with a new nearby focal hypermetabolic activity inferiorly in the left axilla, favored to reflect a venous collateral (rather than a lymph node).  Current Treatment:  Binimetinib  30mg  twice daily (Current;y held)  INTERVAL HISTORY:   Discussed the use of AI scribe software for clinical note transcription with the patient, who gave verbal consent to proceed.  History of Present Illness Elizabeth Norman is an 84 year old female who presents for evaluation of persistent dermatitis after recently holding binimetinib . She is accompanied by her son today.   She has a persistent rash with partial improvement in some areas following topical steroid therapy, but ongoing papular lesions remain in other regions. The rash has been associated with significant lip swelling, particularly postprandially, at times resulting in fissuring and bleeding. Currently, the swelling and bleeding have resolved, and she reports that her lips and mouth are better now, though a few cutaneous bumps persist.She also reported extensive pruritus .   She also experiences chronic arthralgia that significantly disrupts sleep and daily functioning. Acetaminophen  has been ineffective for pain control. She is not currently taking a previously prescribed analgesic and requests a lower dose if it is restarted.     I have reviewed the past medical history, past surgical history, social history and family history with the patient and they are unchanged from previous note.  ALLERGIES:  is allergic to tape.  MEDICATIONS:  Current Outpatient Medications  Medication Sig Dispense Refill   ALPRAZolam  (XANAX ) 0.5 MG tablet Take 0.5 mg by mouth 3 (three) times daily.  2   binimetinib  (MEKTOVI ) 15 MG tablet Take 2 tablets (30 mg total) by mouth 2 (two) times daily. 120 tablet 2   calcium  carbonate (OSCAL) 1500 (600 Ca) MG TABS tablet Take 600 mg of elemental calcium  by mouth 3 (three) times a week.     cholecalciferol (VITAMIN  D) 1000 UNITS tablet Take 1,000 Units by mouth 3 (three) times a week.  Cyanocobalamin  (VITAMIN B-12 PO) Take 1 tablet by mouth once a week.     diphenhydramine -acetaminophen  (TYLENOL  PM) 25-500 MG TABS tablet Take 1 tablet by mouth at bedtime as needed.     doxycycline  (VIBRA -TABS) 100 MG tablet Take 1 tablet (100 mg total) by mouth 2 (two) times daily. 180 tablet 2   furosemide  (LASIX ) 20 MG tablet Take 1 tablet (20 mg total) by mouth daily for 5 days. 30 tablet 0   ibuprofen  (ADVIL ) 800 MG tablet Take 1 tablet (800 mg total) by mouth every 8 (eight) hours as needed. 45 tablet 0   levothyroxine  (SYNTHROID ) 125 MCG tablet Take 1 tablet (125 mcg total) by mouth daily before breakfast. 30 tablet 3   ondansetron  (ZOFRAN -ODT) 4 MG disintegrating tablet Take 1 tablet (4 mg total) by mouth every 6 (six) hours as needed for nausea. 20 tablet 0   prednisoLONE  acetate (PRED FORTE ) 1 % ophthalmic suspension Place 1 drop into both eyes daily.     predniSONE  (DELTASONE ) 10 MG tablet Take 1 tablet (10 mg total) by mouth daily with breakfast. 90 tablet 0   prochlorperazine  (COMPAZINE ) 10 MG tablet Take 1 tablet (10 mg total) by mouth every 6 (six) hours as needed for nausea or vomiting. 90 tablet 3   triamcinolone  cream (KENALOG ) 0.1 % Apply 1 Application topically 2 (two) times daily. 60 g 10   metoprolol  tartrate (LOPRESSOR ) 25 MG tablet Take 1 tablet (25 mg total) by mouth 2 (two) times daily. Hold until follow-up with your doctor/cardiologist (Patient not taking: Reported on 05/09/2024)     miconazole (MICOTIN) 2 % cream Apply 1 Application topically 2 (two) times daily. (Patient not taking: Reported on 05/09/2024)     No current facility-administered medications for this visit.    REVIEW OF SYSTEMS:   All the systems were reviewed with the patient and are negative except HPI   VITALS:  Blood pressure 135/66, pulse (!) 105, temperature 98.2 F (36.8 C), temperature source Tympanic, resp. rate 19,  weight 167 lb (75.8 kg), SpO2 98%.  Wt Readings from Last 3 Encounters:  05/09/24 167 lb (75.8 kg)  05/02/24 167 lb 15.9 oz (76.2 kg)  04/22/24 173 lb 8 oz (78.7 kg)    Body mass index is 30.54 kg/m.  Performance status (ECOG): 2 - Symptomatic, <50% confined to bed  PHYSICAL EXAM:   GENERAL:alert, no distress and comfortable SKIN: Erythematous rash on bilateral arms, lower extremitis, around mouth and some on lips.  LYMPH: Palpable right inguinal lymphadenopathy. LUNGS: clear to auscultation and percussion with normal breathing effort HEART: regular rate & rhythm and no murmurs and no lower extremity edema ABDOMEN:abdomen soft, non-tender and normal bowel sounds Musculoskeletal: Induration in the right thigh 5 into 3 cm in diameter  NEURO: alert & oriented x 3 with fluent speech  LABORATORY DATA:  I have reviewed the data as listed   Lab Results  Component Value Date   WBC 2.9 (L) 05/02/2024   NEUTROABS 2.0 05/02/2024   HGB 9.8 (L) 05/02/2024   HCT 31.1 (L) 05/02/2024   MCV 97.5 05/02/2024   PLT 137 (L) 05/02/2024      Chemistry      Component Value Date/Time   NA 140 05/02/2024 0755   K 3.9 05/02/2024 0755   CL 104 05/02/2024 0755   CO2 24 05/02/2024 0755   BUN 13 05/02/2024 0755   CREATININE 0.74 05/02/2024 0755      Component Value Date/Time   CALCIUM  8.6 (L) 05/02/2024  0755   ALKPHOS 134 (H) 05/02/2024 0755   AST 27 05/02/2024 0755   ALT 14 05/02/2024 0755   BILITOT 0.4 05/02/2024 0755      Latest Reference Range & Units 04/06/24 09:05  Iron 28 - 170 ug/dL 67  UIBC ug/dL 765  TIBC 749 - 549 ug/dL 698  Saturation Ratios 10.4 - 31.8 % 22  Ferritin 11 - 307 ng/mL 68  Folate >5.9 ng/mL 15.2  Vitamin B12 180 - 914 pg/mL 294    RADIOGRAPHIC STUDIES: I have personally reviewed the radiological images as listed and agreed with the findings in the report.  "

## 2024-05-18 ENCOUNTER — Inpatient Hospital Stay: Attending: Hematology

## 2024-05-18 DIAGNOSIS — R600 Localized edema: Secondary | ICD-10-CM | POA: Diagnosis not present

## 2024-05-18 DIAGNOSIS — E611 Iron deficiency: Secondary | ICD-10-CM | POA: Insufficient documentation

## 2024-05-18 DIAGNOSIS — E032 Hypothyroidism due to medicaments and other exogenous substances: Secondary | ICD-10-CM | POA: Diagnosis not present

## 2024-05-18 DIAGNOSIS — C4371 Malignant melanoma of right lower limb, including hip: Secondary | ICD-10-CM | POA: Insufficient documentation

## 2024-05-18 DIAGNOSIS — Z923 Personal history of irradiation: Secondary | ICD-10-CM | POA: Diagnosis not present

## 2024-05-18 DIAGNOSIS — I514 Myocarditis, unspecified: Secondary | ICD-10-CM | POA: Insufficient documentation

## 2024-05-18 DIAGNOSIS — M25551 Pain in right hip: Secondary | ICD-10-CM | POA: Diagnosis not present

## 2024-05-18 DIAGNOSIS — E538 Deficiency of other specified B group vitamins: Secondary | ICD-10-CM | POA: Insufficient documentation

## 2024-05-18 DIAGNOSIS — R21 Rash and other nonspecific skin eruption: Secondary | ICD-10-CM | POA: Insufficient documentation

## 2024-05-18 DIAGNOSIS — Z79890 Hormone replacement therapy (postmenopausal): Secondary | ICD-10-CM | POA: Diagnosis not present

## 2024-05-18 DIAGNOSIS — D61818 Other pancytopenia: Secondary | ICD-10-CM | POA: Insufficient documentation

## 2024-05-18 LAB — CBC WITH DIFFERENTIAL/PLATELET
Abs Immature Granulocytes: 0.03 K/uL (ref 0.00–0.07)
Basophils Absolute: 0 K/uL (ref 0.0–0.1)
Basophils Relative: 0 %
Eosinophils Absolute: 0 K/uL (ref 0.0–0.5)
Eosinophils Relative: 1 %
HCT: 33.7 % — ABNORMAL LOW (ref 36.0–46.0)
Hemoglobin: 10.3 g/dL — ABNORMAL LOW (ref 12.0–15.0)
Immature Granulocytes: 1 %
Lymphocytes Relative: 9 %
Lymphs Abs: 0.5 K/uL — ABNORMAL LOW (ref 0.7–4.0)
MCH: 29.6 pg (ref 26.0–34.0)
MCHC: 30.6 g/dL (ref 30.0–36.0)
MCV: 96.8 fL (ref 80.0–100.0)
Monocytes Absolute: 0.3 K/uL (ref 0.1–1.0)
Monocytes Relative: 6 %
Neutro Abs: 4.5 K/uL (ref 1.7–7.7)
Neutrophils Relative %: 83 %
Platelets: 145 K/uL — ABNORMAL LOW (ref 150–400)
RBC: 3.48 MIL/uL — ABNORMAL LOW (ref 3.87–5.11)
RDW: 15.4 % (ref 11.5–15.5)
WBC: 5.4 K/uL (ref 4.0–10.5)
nRBC: 0 % (ref 0.0–0.2)

## 2024-05-18 LAB — T4, FREE: Free T4: 1.68 ng/dL (ref 0.80–2.00)

## 2024-05-18 LAB — COMPREHENSIVE METABOLIC PANEL WITH GFR
ALT: 9 U/L (ref 0–44)
AST: 22 U/L (ref 15–41)
Albumin: 3.8 g/dL (ref 3.5–5.0)
Alkaline Phosphatase: 108 U/L (ref 38–126)
Anion gap: 13 (ref 5–15)
BUN: 16 mg/dL (ref 8–23)
CO2: 25 mmol/L (ref 22–32)
Calcium: 8.6 mg/dL — ABNORMAL LOW (ref 8.9–10.3)
Chloride: 101 mmol/L (ref 98–111)
Creatinine, Ser: 0.68 mg/dL (ref 0.44–1.00)
GFR, Estimated: >60 mL/min
Glucose, Bld: 139 mg/dL — ABNORMAL HIGH (ref 70–99)
Potassium: 3.6 mmol/L (ref 3.5–5.1)
Sodium: 139 mmol/L (ref 135–145)
Total Bilirubin: 0.5 mg/dL (ref 0.0–1.2)
Total Protein: 6.6 g/dL (ref 6.5–8.1)

## 2024-05-18 LAB — IRON AND TIBC
Iron: 29 ug/dL (ref 28–170)
Saturation Ratios: 10 % — ABNORMAL LOW (ref 10.4–31.8)
TIBC: 304 ug/dL (ref 250–450)
UIBC: 275 ug/dL

## 2024-05-18 LAB — VITAMIN B12: Vitamin B-12: 245 pg/mL (ref 180–914)

## 2024-05-18 LAB — TSH: TSH: 2.45 u[IU]/mL (ref 0.350–4.500)

## 2024-05-18 LAB — FERRITIN: Ferritin: 40 ng/mL (ref 11–307)

## 2024-05-18 LAB — FOLATE: Folate: 11.2 ng/mL

## 2024-05-18 NOTE — Progress Notes (Signed)
 Port flushed with good blood return noted. No bruising or swelling at site. Bandaid applied and patient discharged in satisfactory condition. VVS stable with no signs or symptoms of distressed noted.

## 2024-05-18 NOTE — Patient Instructions (Signed)
 [facility] [place] - A DEPT OF [facility]  Discharge Instructions: Thank you for choosing [facility] to provide your oncology and hematology care.  If you have a lab appointment with the Cancer Center - please note that after April 8th, 2024, all labs will be drawn in the cancer center.  You do not have to check in or register with the main entrance as you have in the past but will complete your check-in in the cancer center.  Wear comfortable clothing and clothing appropriate for easy access to any Portacath or PICC line.   We strive to give you quality time with your provider. You may need to reschedule your appointment if you arrive late (15 or more minutes).  Arriving late affects you and other patients whose appointments are after yours.  Also, if you miss three or more appointments without notifying the office, you may be dismissed from the clinic at the provider's discretion.      For prescription refill requests, have your pharmacy contact our office and allow 72 hours for refills to be completed.    Today you received the following Port flush with labs, return as scheduled.   To help prevent nausea and vomiting after your treatment, we encourage you to take your nausea medication as directed.  BELOW ARE SYMPTOMS THAT SHOULD BE REPORTED IMMEDIATELY: *FEVER GREATER THAN 100.4 F (38 C) OR HIGHER *CHILLS OR SWEATING *NAUSEA AND VOMITING THAT IS NOT CONTROLLED WITH YOUR NAUSEA MEDICATION *UNUSUAL SHORTNESS OF BREATH *UNUSUAL BRUISING OR BLEEDING *URINARY PROBLEMS (pain or burning when urinating, or frequent urination) *BOWEL PROBLEMS (unusual diarrhea, constipation, pain near the anus) TENDERNESS IN MOUTH AND THROAT WITH OR WITHOUT PRESENCE OF ULCERS (sore throat, sores in mouth, or a toothache) UNUSUAL RASH, SWELLING OR PAIN  UNUSUAL VAGINAL DISCHARGE OR ITCHING   Items with * indicate a potential emergency and should be followed up as soon as  possible or go to the Emergency Department if any problems should occur.  Please show the CHEMOTHERAPY ALERT CARD or IMMUNOTHERAPY ALERT CARD at check-in to the Emergency Department and triage nurse.  Should you have questions after your visit or need to cancel or reschedule your appointment, please contact [facility] [place] - A DEPT OF [facility] [phone]  and follow the prompts.  Office hours are 8:00 a.m. to 4:30 p.m. Monday - Friday. Please note that voicemails left after 4:00 p.m. may not be returned until the following business day.  We are closed weekends and major holidays. You have access to a nurse at all times for urgent questions. Please call the main number to the clinic [phone] and follow the prompts.  For any non-urgent questions, you may also contact your provider using MyChart. We now offer e-Visits for anyone 88 and older to request care online for non-urgent symptoms. For details visit [email or web address].   Also download the MyChart app! Go to the app store, search MyChart, open the app, select [place], and log in with your MyChart username and password.

## 2024-05-19 ENCOUNTER — Ambulatory Visit

## 2024-05-23 ENCOUNTER — Ambulatory Visit: Attending: Oncology

## 2024-05-23 NOTE — Telephone Encounter (Signed)
 Oral Oncology Patient Advocate Encounter  Pending LIS letter.  Elizabeth Norman (Elizabeth Norman) [name], CPhT  [facility], [name], [facility] - Oral Chemotherapy Patient Advocate Specialist III Phone: [phone]  Fax: [phone]

## 2024-05-24 LAB — ECHOCARDIOGRAM COMPLETE
AR max vel: 2.39 cm2
AV Area VTI: 1.93 cm2
AV Area mean vel: 2.18 cm2
AV Mean grad: 5.9 mmHg
AV Peak grad: 10.4 mmHg
AV Vena cont: 0.6 cm
Ao pk vel: 1.61 m/s
Area-P 1/2: 3.85 cm2
Calc EF: 49 %
P 1/2 time: 384 ms
S' Lateral: 3.7 cm
Single Plane A2C EF: 50 %
Single Plane A4C EF: 42.3 %

## 2024-05-25 NOTE — Progress Notes (Signed)
 " Patient Care Team: [name] LABOR, MD as PCP - General (Internal Medicine) Elmira Newman PARAS, MD as PCP - Cardiology (Cardiology) Celestia Joesph SQUIBB, RN as Oncology Nurse Navigator (Oncology)  Clinic Day:  05/09/2024  Referring physician: [name] LABOR, MD   CHIEF COMPLAINT:  CC: Metastatic malignant melanoma   ASSESSMENT & PLAN:   Assessment & Plan: Elizabeth Norman  is a 84 y.o. female with metastatic malignant melanoma  Malignant melanoma of the lower leg, right  Metastatic malignant melanoma initially diagnosed in 2022.  Extensive oncology history below S/p wide local excision and lymph node biopsy.  Adjuvant therapy with Opdualag  resulting in myocarditis requiring hospital admission and hence was discontinued Received radiation to femoral lesion and inguinal lymph node. NGS consistent with negative BRAF, positive NRAS and positive PTEN Second line: Pembrolizumab  Third line: Ipilimumab  added for progression along with pembrolizumab . Patient was started on binimetinib  30mg  twice daily on 02/03/2024 and PET scan after that showed good response. Complicated by diffuse edema and rash.  - Edema and rash improved with holding the treatment. Will restart Binimetinib  at 15mg  twice a day. Considering patients age and co morbidities and with limited options for metastatic melanoma, I would like to try lower dose prior to changing to other treatments - Echocardiogram with good LVEF. Has follow up with cardiology scheduled. Will need repeat ECHO in 2 months - Will repeat PET scan in 3 months  Return to clinic in 1 month to assess for tolerance  Rash Erythematous papular rash around mouth and face. Likely secondary to binimetinib  Improved with holding binimetinib   - Continue triamcinolone  0.1% to be used twice daily as needed. - Continue doxycycline  100mg  twice daily - Discontinue prednisone  10mg  daily  Right hip pain Likely secondary to the femoral lesion Improved  today  Hypothyroidism due to medication Likely secondary to immunotherapy On levothyroxine  50 mcg daily Recent TSH on labs was normal  - Continue levothyroxine  50 mcg daily  Iron deficiency The most likely cause of her anemia is due to poor nutrition TsA:10, Ferritin: 40   -We discussed some of the risks, benefits, and alternatives of intravenous iron infusions. The patient is symptomatic from anemia and the iron level is critically low. She tolerated oral iron supplement poorly and desires to achieve higher levels of iron faster for adequate hematopoesis. Some of the side-effects to be expected including risks of infusion reactions, phlebitis, headaches, nausea and fatigue.  The patient is willing to proceed. Patient education material was dispensed. Goal is to keep ferritin level greater than 50 and resolution of anemia -Start oral iron every other day.  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  She knows to contact our office if she develops concerns prior to her next appointment.  The total time spent in the appointment was 15 minutes for the encounter with patient, including review of chart and various tests results, discussions about plan of care and coordination of care plan   I,Helena R Teague,acting as a scribe  for Mickiel Dry, MD.,have documented all relevant documentation on the behalf of Mickiel Dry, MD,as directed by  Mickiel Dry, MD while in the presence of Mickiel Dry, MD.  I, Mickiel Dry MD, have reviewed the above documentation for accuracy and completeness, and I agree with the above.     Mickiel Dry, MD  [facility] [place] [facility] [place] - A DEPT OF [facility] [address] [place] [address] Dept: [phone] Dept Fax: [phone]   No orders of the defined types were placed in this encounter.    ONCOLOGY HISTORY:   I have reviewed her chart and materials related to her cancer extensively and collaborated history with the patient. Summary of oncologic history is as follows:   Diagnosis: Malignant melanoma of the right posterior leg (pT4 pN3 M1), BRAF V600 negative   -Presentation: fleshy lesion on right leg -12/2020: Right lower leg lesion: Biopsy: Malignant melanoma, nodular subtype, Breslow thickness 9.52 mm, no satellitosis, no ulceration, no LVI.  Deep margin was positive.  There were 2 mitosis per millimeter squared.  Path was pT4a. ( Per documentation) -02/12/2021: Wide local lesion excision and lymph node biopsy.  Pathology: Residual malignant melanoma, 7.0 mm. Margins free of melanoma. No lymphovascular invasion present. Three of three positive sentinel lymph nodes for melanoma (3/3). Pathologic staging: pT4a, pN3. MelanA and Sox-10 highlight the melanoma.  -03/22/2021: Initial PET: Three foci of intense metabolic activity within the RIGHT lower extremity consistent with metastatic melanoma. Lesions involve the subcutaneous tissue, intramedullary bone of the RIGHT femur as well as soft tissue activity in the RIGHT condylar notch. Focus of metabolic activity adjacent to the RIGHT iliac wing is indeterminate differential including metastatic melanoma versus trauma. No evidence of visceral metastasis in the chest abdomen pelvis. Focus of intense radiotracer activity at the angle of the RIGHT jaw is favored to localize within the RIGHT  parotid gland. Favor primary parotid neoplasm. -04/05/2021: Port insertion -04/10/2021: MRI brain: No intracranial metastatic disease. No acute intracranial abnormality. -04/15/2021: MRI of right hip and right knee: Solitary osseous metastasis posteriorly in the distal right femoral diaphysis. Multiple nodal metastases anteromedially in the proximal to mid right thigh. The hypermetabolic activity in the right knee intercondylar notch on prior PET-CT shows no clear corresponding enhancing lesion and may relate to a small synovial cyst or inflammation. -04/19/2021:[facility] NGS: BRAF negative, positive for NRAS pathogenic variant exon 3, TERT promoter.  MSI-stable.  MMR-proficient.  NTRK 1/2/3 fusion not detected.  KIT mutation negative.  PD-L1 negative.  -05/09/2021: Nivolumab -Relatlimab  (Opdualag ) every 28 days, discontinued after first dose due to myocarditis -05/2021-07/2021: Off treatment -07/25/2021: PET: Hypermetabolic lymph nodes in the LEFT groin adjacent to lymphadenectomy clips consistent with melanoma recurrence. Hypermetabolic soft tissue nodule in the subcutaneous tissue of the medial RIGHT thigh consistent with melanoma. Hypermetabolic nodule within the medullary space of the midshaft RIGHT femur consistent skeletal metastasis. No evidence of metastatic melanoma above the RIGHT groin lesions. -09/03/2021-01/13/2023: Pembrolizumab    -02/13/2022: PET:  Interval recurrence of FDG avid lesion within the mid shaft of the right femur. Interval increase in size and degree of FDG uptake associated with soft tissue nodule within the medial right thigh. Similar appearance of tracer avid nodule within the deep right parotid gland. This is favored to represent a primary parotid neoplasm. -03/26/2022-04/09/2022: Radiation therapy to soft tissue nodule within right medial thigh and midshaft right femoral lesion  -12/2022: Guardant360: NRAS Q61R (? Binimetinib ), TP 53 C17 6Y, TERT  promoter SNV, TMB 20.8, MSI  high not detected.  -01/01/2023: PET: Postsurgical changes in the medial right calf. Right femoral osseous metastasis and metastatic lesion/node in the medial right thigh, grossly unchanged. New right pelvic nodal metastases, reflecting progression of disease. -01/14/2023-current: Pembrolizumab  with low-dose ipilimumab   -05/22/2023: PET: There are 2 soft tissue nodules within the right upper quadrant of the abdomen which are tracer avid, new from the previous PET-CT and new from the CT from 03/30/2023. Indeterminate. Peritoneal metastasis cannot be excluded. Multiple tracer avid right-sided pelvic lymph nodes are identified compatible with nodal metastasis. These are new compared with the previous PET-CT from 01/01/2023. There is a new focal area of increased radiotracer uptake within the anterior inferior aspect of the L3 vertebral body. No corresponding CT abnormality identified. Findings are concerning for osseous metastasis. Stable appearance of tracer avid lesion within the distal diaphysis of the right femur. Stable appearance of soft tissue nodule within the medial right thigh at the site of treated melanoma. -06/09/2023: Guardant360: NRAS Q61R (binimetinib ), PTEN deletion, exon 3-9 (Capivasertib), PTEN deletion exon 1-6 (Capivasertib), TP 53, TERT promoter SNV  -07/23/2023: PET: Somewhat mixed response to therapy with enlarging and increasingly hypermetabolic right inguinal adenopathy and a new L5 metastasis. Stable or decreased hypermetabolism involving right upper quadrant peritoneal nodules, right iliac chain adenopathy, L5 and right femoral metastases and a treated soft tissue lesion in the medial right thigh. Decreasing hypermetabolism in the gallbladder fossa, possibly postoperative in etiology. Hypermetabolic medial right parotid nodule, stable.  -08/31/2023: Radiation therapy to right inguinal lymph node -01/28/2024: PET: Mixed response to therapy. Interval increase in size and metabolic  activity of lesion in the proximal RIGHT humerus. Interval increase in size and metabolic activity of lesion in the proximal RIGHT femur. New hypermetabolic lesions in the L1 and T11 vertebral bodies. Stable hypermetabolic lesion in the L3 vertebral body. Stable hypermetabolic lesion in the RIGHT chest wall. Stable hypermetabolic lesion in the RIGHT external iliac and inguinal lymph nodes. Stable hypermetabolic lesion in the RIGHT medial thigh. Decrease in metabolic activity of gallbladder lesion. - 02/03/2024-current: Binimetinib  30 mg twice daily  -Held briefly for edema and rash  -05/26/2024: Restarted at a low dose- 15mg  twice daily -03/31/2024: PET: Interval response to therapy with decreased metabolic activity associated with previously demonstrated osseous and soft tissue metastases. There is persistent intense hypermetabolic activity within the distal right femoral lesion. No definite new or enlarging metastases identified. New prominent vascular activity within the left brachial, axillary and subclavian veins with a new nearby focal hypermetabolic activity inferiorly in the left axilla, favored to reflect a venous collateral (rather than a lymph node).  Current Treatment:  Binimetinib  15mg  twice daily   INTERVAL HISTORY:   Elizabeth Norman is here today for follow-up of melanoma of the right posterior leg. She is accompanied by her son today.   Her chemotherapy-induced rash has resolved and she no longer requires kenalog  cream. She is agreeable to restart treatment with a lower dosage.   She is taking doxycycline  and prednisone  as prescribed. She reports ibuprofen  is not effective in improving right hip pain. Taegen is taking vitamin B12 supplements and will start taking iron supplements for her normocytic anemia.   I have reviewed the past medical history, past surgical history, social history and family history with the patient and they are unchanged from previous note.  ALLERGIES:  is  allergic to tape.  MEDICATIONS:  Current Outpatient Medications  Medication Sig Dispense Refill   ALPRAZolam  (XANAX ) 0.5  MG tablet Take 0.5 mg by mouth 3 (three) times daily.  2   calcium  carbonate (OSCAL) 1500 (600 Ca) MG TABS tablet Take 600 mg of elemental calcium  by mouth 3 (three) times a week. (Patient taking differently: Take 600 mg of elemental calcium  by mouth 3 (three) times a week. Takes once a month)     cholecalciferol (VITAMIN D) 1000 UNITS tablet Take 1,000 Units by mouth 3 (three) times a week.     Cyanocobalamin  (VITAMIN B-12 PO) Take 1 tablet by mouth once a week.     diphenhydramine -acetaminophen  (TYLENOL  PM) 25-500 MG TABS tablet Take 1 tablet by mouth at bedtime as needed.     doxycycline  (VIBRA -TABS) 100 MG tablet Take 1 tablet (100 mg total) by mouth 2 (two) times daily. 180 tablet 2   ibuprofen  (ADVIL ) 800 MG tablet Take 1 tablet (800 mg total) by mouth every 8 (eight) hours as needed. 45 tablet 0   levothyroxine  (SYNTHROID ) 125 MCG tablet Take 1 tablet (125 mcg total) by mouth daily before breakfast. 30 tablet 3   miconazole (MICOTIN) 2 % cream Apply 1 Application topically 2 (two) times daily.     ondansetron  (ZOFRAN -ODT) 4 MG disintegrating tablet Take 1 tablet (4 mg total) by mouth every 6 (six) hours as needed for nausea. 20 tablet 0   prednisoLONE  acetate (PRED FORTE ) 1 % ophthalmic suspension Place 1 drop into both eyes daily.     predniSONE  (DELTASONE ) 10 MG tablet Take 1 tablet (10 mg total) by mouth daily with breakfast. 90 tablet 0   prochlorperazine  (COMPAZINE ) 10 MG tablet Take 1 tablet (10 mg total) by mouth every 6 (six) hours as needed for nausea or vomiting. 90 tablet 3   triamcinolone  cream (KENALOG ) 0.1 % Apply 1 Application topically 2 (two) times daily. 60 g 10   binimetinib  (MEKTOVI ) 15 MG tablet Take 2 tablets (30 mg total) by mouth 2 (two) times daily. (Patient not taking: Reported on 05/26/2024) 120 tablet 2   furosemide  (LASIX ) 20 MG tablet Take 1  tablet (20 mg total) by mouth daily for 5 days. (Patient not taking: Reported on 05/26/2024) 30 tablet 0   metoprolol  tartrate (LOPRESSOR ) 25 MG tablet Take 1 tablet (25 mg total) by mouth 2 (two) times daily. Hold until follow-up with your doctor/cardiologist (Patient not taking: Reported on 05/26/2024)     No current facility-administered medications for this visit.    REVIEW OF SYSTEMS:   All the systems were reviewed with the patient and are negative except HPI   VITALS:  Blood pressure 128/72, pulse 79, temperature 97.9 F (36.6 C), temperature source Oral, resp. rate 20, weight 160 lb 14.4 oz (73 kg), SpO2 99%.  Wt Readings from Last 3 Encounters:  05/26/24 160 lb 14.4 oz (73 kg)  05/09/24 167 lb (75.8 kg)  05/02/24 167 lb 15.9 oz (76.2 kg)    Body mass index is 29.43 kg/m.  Performance status (ECOG): 2 - Symptomatic, <50% confined to bed  PHYSICAL EXAM:   GENERAL:alert, no distress and comfortable SKIN: Rash significantly improved   LYMPH: Palpable right inguinal lymphadenopathy. LUNGS: clear to auscultation and percussion with normal breathing effort HEART: regular rate & rhythm and no murmurs and no lower extremity edema ABDOMEN:abdomen soft, non-tender and normal bowel sounds Musculoskeletal: Induration in the right thigh 5 into 3 cm in diameter  NEURO: alert & oriented x 3 with fluent speech  LABORATORY DATA:  I have reviewed the data as listed   Lab Results  Component Value Date   WBC 5.4 05/18/2024   NEUTROABS 4.5 05/18/2024   HGB 10.3 (L) 05/18/2024   HCT 33.7 (L) 05/18/2024   MCV 96.8 05/18/2024   PLT 145 (L) 05/18/2024      Chemistry      Component Value Date/Time   NA 139 05/18/2024 0942   K 3.6 05/18/2024 0942   CL 101 05/18/2024 0942   CO2 25 05/18/2024 0942   BUN 16 05/18/2024 0942   CREATININE 0.68 05/18/2024 0942      Component Value Date/Time   CALCIUM  8.6 (L) 05/18/2024 0942   ALKPHOS 108 05/18/2024 0942   AST 22 05/18/2024 0942    ALT 9 05/18/2024 0942   BILITOT 0.5 05/18/2024 0942      Latest Reference Range & Units 05/18/24 09:42  Iron 28 - 170 ug/dL 29  UIBC ug/dL 724  TIBC 749 - 549 ug/dL 695  Saturation Ratios 10.4 - 31.8 % 10 (L)  Ferritin 11 - 307 ng/mL 40  Folate >5.9 ng/mL 11.2  Vitamin B12 180 - 914 pg/mL 245  (L): Data is abnormally low   Latest Reference Range & Units 05/18/24 09:42  TSH 0.350 - 4.500 uIU/mL 2.450  T4,Free(Direct) 0.80 - 2.00 ng/dL 8.31   RADIOGRAPHIC STUDIES: I have personally reviewed the radiological images as listed and agreed with the findings in the report.  "

## 2024-05-25 NOTE — Telephone Encounter (Signed)
 Oral Oncology Patient Advocate Encounter  Pt's daughter is aware of requirement, stated that patient's medication is on hold but will know tomorrow at her appointment if she is staying on medication or continuing to hold.  Hassen Bruun ([name]) [name], CPhT  [facility], [name], [facility] - Oral Chemotherapy Patient Advocate Specialist III Phone: [phone]  Fax: [phone]

## 2024-05-26 ENCOUNTER — Inpatient Hospital Stay: Admitting: Oncology

## 2024-05-26 VITALS — BP 128/72 | HR 79 | Temp 97.9°F | Resp 20 | Wt 160.9 lb

## 2024-05-26 DIAGNOSIS — M25551 Pain in right hip: Secondary | ICD-10-CM

## 2024-05-26 DIAGNOSIS — R21 Rash and other nonspecific skin eruption: Secondary | ICD-10-CM

## 2024-05-26 DIAGNOSIS — I514 Myocarditis, unspecified: Secondary | ICD-10-CM

## 2024-05-26 DIAGNOSIS — E032 Hypothyroidism due to medicaments and other exogenous substances: Secondary | ICD-10-CM | POA: Diagnosis not present

## 2024-05-26 DIAGNOSIS — E538 Deficiency of other specified B group vitamins: Secondary | ICD-10-CM | POA: Diagnosis not present

## 2024-05-26 DIAGNOSIS — E611 Iron deficiency: Secondary | ICD-10-CM | POA: Diagnosis not present

## 2024-05-26 DIAGNOSIS — D61818 Other pancytopenia: Secondary | ICD-10-CM | POA: Diagnosis not present

## 2024-05-26 DIAGNOSIS — C4371 Malignant melanoma of right lower limb, including hip: Secondary | ICD-10-CM | POA: Diagnosis not present

## 2024-05-26 DIAGNOSIS — Z79899 Other long term (current) drug therapy: Secondary | ICD-10-CM

## 2024-05-26 DIAGNOSIS — R600 Localized edema: Secondary | ICD-10-CM

## 2024-05-26 NOTE — Patient Instructions (Addendum)
  Cancer Center at [facility] Discharge Instructions   You were seen and examined today by Dr. Davonna.  She reviewed the results of your lab work which are normal/stable.   She reviewed the results of your echocardiogram which showed sufficient heart function (ejection fraction 60-65%).   Your iron is low. We will arrange for you to have IV iron here in the clinic. You will receive a total of 2 doses. These are given one week apart.   We will have you resume taking the Mektovi  at a lower dose.    Return as scheduled.    Thank you for choosing  Cancer Center at [facility] to provide your oncology and hematology care.  To afford each patient quality time with our provider, please arrive at least 15 minutes before your scheduled appointment time.   If you have a lab appointment with the Cancer Center please come in thru the Main Entrance and check in at the main information desk.  You need to re-schedule your appointment should you arrive 10 or more minutes late.  We strive to give you quality time with our providers, and arriving late affects you and other patients whose appointments are after yours.  Also, if you no show three or more times for appointments you may be dismissed from the clinic at the providers discretion.     Again, thank you for choosing [facility].  Our hope is that these requests will decrease the amount of time that you wait before being seen by our physicians.       _____________________________________________________________  Should you have questions after your visit to [facility], please contact our office at [phone] and follow the prompts.  Our office hours are 8:00 a.m. and 4:30 p.m. Monday - Friday.  Please note that voicemails left after 4:00 p.m. may not be returned until the following business day.  We are closed weekends and major holidays.  You do have access to a nurse 24-7, just call  the main number to the clinic [phone] and do not press any options, hold on the line and a nurse will answer the phone.    For prescription refill requests, have your pharmacy contact our office and allow 72 hours.    Due to Covid, you will need to wear a mask upon entering the hospital. If you do not have a mask, a mask will be given to you at the Main Entrance upon arrival. For doctor visits, patients may have 1 support person age 58 or older with them. For treatment visits, patients can not have anyone with them due to social distancing guidelines and our immunocompromised population.

## 2024-05-27 NOTE — Telephone Encounter (Signed)
 Oral Oncology Patient Advocate Encounter  Patient will continue with medication at a lower dose.  Will continue with PAP.  Still pending LIS denial letter.  Jaskarn Schweer ([name]) [name], CPhT  [facility], [name], [facility] - Oral Chemotherapy Patient Advocate Specialist III Phone: [phone]  Fax: [phone]

## 2024-05-31 NOTE — Telephone Encounter (Signed)
 Oral Oncology Patient Advocate Encounter  Patient's daughter-in-law, Elizabeth Norman, stated that they would like to wait a few months to fully apply since the patient still has medication.  Patient and her daughter-in-law, Elizabeth Norman, know to call me once they are ready to restart on the PAP process.  [name] ([name]) [name], CPhT  [name], [name], [facility] - Oral Chemotherapy Patient Advocate Specialist III Phone: [phone]  Fax: [phone]

## 2024-06-01 ENCOUNTER — Inpatient Hospital Stay

## 2024-06-01 VITALS — BP 127/65 | HR 62 | Temp 97.0°F | Resp 18

## 2024-06-01 DIAGNOSIS — C4371 Malignant melanoma of right lower limb, including hip: Secondary | ICD-10-CM | POA: Diagnosis not present

## 2024-06-01 MED ORDER — SODIUM CHLORIDE 0.9 % IV SOLN: INTRAVENOUS | Status: DC

## 2024-06-01 MED ORDER — CETIRIZINE HCL 10 MG/ML IV SOLN
5.0000 mg | Freq: Once | INTRAVENOUS | Status: AC
  Administered 2024-06-01: 5 mg via INTRAVENOUS
  Filled 2024-06-01: qty 1

## 2024-06-01 MED ORDER — ACETAMINOPHEN 325 MG PO TABS
650.0000 mg | ORAL_TABLET | Freq: Once | ORAL | Status: AC
  Administered 2024-06-01: 650 mg via ORAL
  Filled 2024-06-01: qty 2

## 2024-06-01 MED ORDER — SODIUM CHLORIDE 0.9 % IV SOLN
510.0000 mg | Freq: Once | INTRAVENOUS | Status: AC
  Administered 2024-06-01: 510 mg via INTRAVENOUS
  Filled 2024-06-01: qty 510

## 2024-06-01 NOTE — Progress Notes (Signed)
 Patient presents today for first dose of Feraheme infusion. Vital signs stable. Patient teaching performed. Information discharge sheet printed and given to patient pertaining to side effects that could occur. Patient has no questions or concerns today.   Feraheme given today per MD orders. Tolerated infusion without adverse affects. Vital signs stable. No complaints at this time. Discharged from clinic ambulatory in stable condition. Alert and oriented x 3. F/U with [facility] as scheduled.

## 2024-06-01 NOTE — Patient Instructions (Signed)
 [facility] [place] - A DEPT OF [facility]  Discharge Instructions: Thank you for choosing [facility] to provide your oncology and hematology care.  If you have a lab appointment with the Cancer Center - please note that after April 8th, 2024, all labs will be drawn in the cancer center.  You do not have to check in or register with the main entrance as you have in the past but will complete your check-in in the cancer center.  Wear comfortable clothing and clothing appropriate for easy access to any Portacath or PICC line.   We strive to give you quality time with your provider. You may need to reschedule your appointment if you arrive late (15 or more minutes).  Arriving late affects you and other patients whose appointments are after yours.  Also, if you miss three or more appointments without notifying the office, you may be dismissed from the clinic at the provider's discretion.      For prescription refill requests, have your pharmacy contact our office and allow 72 hours for refills to be completed.    Today you received the following chemotherapy and/or immunotherapy agents Feraheme. Ferumoxytol Injection What is this medication? FERUMOXYTOL (FER ue MOX i tol) treats low levels of iron in your body (iron deficiency anemia). Iron is a mineral that plays an important role in making red blood cells, which carry oxygen from your lungs to the rest of your body. This medicine may be used for other purposes; ask your health care provider or pharmacist if you have questions. COMMON BRAND NAME(S): Feraheme What should I tell my care team before I take this medication? They need to know if you have any of these conditions: Anemia not caused by low iron levels High levels of iron in the blood Magnetic resonance imaging (MRI) test scheduled An unusual or allergic reaction to iron, other medications, foods, dyes, or preservatives Pregnant or trying to get  pregnant Breastfeeding How should I use this medication? This medication is injected into a vein. It is given by your care team in a hospital or clinic setting. Talk to your care team the use of this medication in children. Special care may be needed. Overdosage: If you think you have taken too much of this medicine contact a poison control center or emergency room at once. NOTE: This medicine is only for you. Do not share this medicine with others. What if I miss a dose? It is important not to miss your dose. Call your care team if you are unable to keep an appointment. What may interact with this medication? Other iron products This list may not describe all possible interactions. Give your health care provider a list of all the medicines, herbs, non-prescription drugs, or dietary supplements you use. Also tell them if you smoke, drink alcohol, or use illegal drugs. Some items may interact with your medicine. What should I watch for while using this medication? Visit your care team for regular checks on your progress. Tell your care team if your symptoms do not start to get better or if they get worse. You may need blood work done while you are taking this medication. You may need to eat more foods that contain iron. Talk to your care team. Foods that contain iron include whole grains or cereals, dried fruits, beans, peas, leafy green vegetables, and organ meats (liver, kidney). What side effects may I notice from receiving this medication? Side effects that  you should report to your care team as soon as possible: Allergic reactions--skin rash, itching, hives, swelling of the face, lips, tongue, or throat Low blood pressure--dizziness, feeling faint or lightheaded, blurry vision Shortness of breath Side effects that usually do not require medical attention (report to your care team if they continue or are bothersome): Flushing Headache Joint pain Muscle pain Nausea Pain, redness, or  irritation at injection site This list may not describe all possible side effects. Call your doctor for medical advice about side effects. You may report side effects to FDA at 1-800-FDA-1088. Where should I keep my medication? This medication is given in a hospital or clinic. It will not be stored at home. NOTE: This sheet is a summary. It may not cover all possible information. If you have questions about this medicine, talk to your doctor, pharmacist, or health care provider.  2024 Elsevier/Gold Standard (2022-12-17 00:00:00)      To help prevent nausea and vomiting after your treatment, we encourage you to take your nausea medication as directed.  BELOW ARE SYMPTOMS THAT SHOULD BE REPORTED IMMEDIATELY: *FEVER GREATER THAN 100.4 F (38 C) OR HIGHER *CHILLS OR SWEATING *NAUSEA AND VOMITING THAT IS NOT CONTROLLED WITH YOUR NAUSEA MEDICATION *UNUSUAL SHORTNESS OF BREATH *UNUSUAL BRUISING OR BLEEDING *URINARY PROBLEMS (pain or burning when urinating, or frequent urination) *BOWEL PROBLEMS (unusual diarrhea, constipation, pain near the anus) TENDERNESS IN MOUTH AND THROAT WITH OR WITHOUT PRESENCE OF ULCERS (sore throat, sores in mouth, or a toothache) UNUSUAL RASH, SWELLING OR PAIN  UNUSUAL VAGINAL DISCHARGE OR ITCHING   Items with * indicate a potential emergency and should be followed up as soon as possible or go to the Emergency Department if any problems should occur.  Please show the CHEMOTHERAPY ALERT CARD or IMMUNOTHERAPY ALERT CARD at check-in to the Emergency Department and triage nurse.  Should you have questions after your visit or need to cancel or reschedule your appointment, please contact [facility] [place] - A DEPT OF Eligha Bridegroom [facility] [phone]  and follow the prompts.  Office hours are 8:00 a.m. to 4:30 p.m. Monday - Friday. Please note that voicemails left after 4:00 p.m. may not be returned until the following business day.  We are closed weekends  and major holidays. You have access to a nurse at all times for urgent questions. Please call the main number to the clinic [phone] and follow the prompts.  For any non-urgent questions, you may also contact your provider using MyChart. We now offer e-Visits for anyone 67 and older to request care online for non-urgent symptoms. For details visit [email or web address].   Also download the MyChart app! Go to the app store, search "MyChart", open the app, select [place], and log in with your MyChart username and password.

## 2024-06-08 ENCOUNTER — Inpatient Hospital Stay

## 2024-06-08 VITALS — BP 109/68 | HR 63 | Temp 97.3°F | Resp 16

## 2024-06-08 DIAGNOSIS — C4371 Malignant melanoma of right lower limb, including hip: Secondary | ICD-10-CM | POA: Diagnosis not present

## 2024-06-08 MED ORDER — SODIUM CHLORIDE 0.9 % IV SOLN: INTRAVENOUS | Status: DC

## 2024-06-08 MED ORDER — CETIRIZINE HCL 10 MG/ML IV SOLN
5.0000 mg | Freq: Once | INTRAVENOUS | Status: AC
  Administered 2024-06-08: 5 mg via INTRAVENOUS
  Filled 2024-06-08: qty 1

## 2024-06-08 MED ORDER — SODIUM CHLORIDE 0.9 % IV SOLN
510.0000 mg | Freq: Once | INTRAVENOUS | Status: AC
  Administered 2024-06-08: 510 mg via INTRAVENOUS
  Filled 2024-06-08: qty 510

## 2024-06-08 NOTE — Patient Instructions (Signed)
 [facility] [place] - A DEPT OF [facility]  Discharge Instructions: Thank you for choosing [facility] to provide your oncology and hematology care.  If you have a lab appointment with the Cancer Center - please note that after April 8th, 2024, all labs will be drawn in the cancer center.  You do not have to check in or register with the main entrance as you have in the past but will complete your check-in in the cancer center.  Wear comfortable clothing and clothing appropriate for easy access to any Portacath or PICC line.   We strive to give you quality time with your provider. You may need to reschedule your appointment if you arrive late (15 or more minutes).  Arriving late affects you and other patients whose appointments are after yours.  Also, if you miss three or more appointments without notifying the office, you may be dismissed from the clinic at the provider's discretion.      For prescription refill requests, have your pharmacy contact our office and allow 72 hours for refills to be completed.    Today you received the following chemotherapy and/or immunotherapy agents Ferahme      To help prevent nausea and vomiting after your treatment, we encourage you to take your nausea medication as directed.  BELOW ARE SYMPTOMS THAT SHOULD BE REPORTED IMMEDIATELY: *FEVER GREATER THAN 100.4 F (38 C) OR HIGHER *CHILLS OR SWEATING *NAUSEA AND VOMITING THAT IS NOT CONTROLLED WITH YOUR NAUSEA MEDICATION *UNUSUAL SHORTNESS OF BREATH *UNUSUAL BRUISING OR BLEEDING *URINARY PROBLEMS (pain or burning when urinating, or frequent urination) *BOWEL PROBLEMS (unusual diarrhea, constipation, pain near the anus) TENDERNESS IN MOUTH AND THROAT WITH OR WITHOUT PRESENCE OF ULCERS (sore throat, sores in mouth, or a toothache) UNUSUAL RASH, SWELLING OR PAIN  UNUSUAL VAGINAL DISCHARGE OR ITCHING   Items with * indicate a potential emergency and should be followed up  as soon as possible or go to the Emergency Department if any problems should occur.  Please show the CHEMOTHERAPY ALERT CARD or IMMUNOTHERAPY ALERT CARD at check-in to the Emergency Department and triage nurse.  Should you have questions after your visit or need to cancel or reschedule your appointment, please contact [facility] [place] - A DEPT OF [facility] [phone]  and follow the prompts.  Office hours are 8:00 a.m. to 4:30 p.m. Monday - Friday. Please note that voicemails left after 4:00 p.m. may not be returned until the following business day.  We are closed weekends and major holidays. You have access to a nurse at all times for urgent questions. Please call the main number to the clinic [phone] and follow the prompts.  For any non-urgent questions, you may also contact your provider using MyChart. We now offer e-Visits for anyone 100 and older to request care online for non-urgent symptoms. For details visit [email or web address].   Also download the MyChart app! Go to the app store, search MyChart, open the app, select , and log in with your MyChart username and password.

## 2024-06-08 NOTE — Progress Notes (Signed)
Patient presents today for Feraheme infusion per providers order.  Vital signs WNL.  Patient has no new complaints at this time.  Stable during infusion without adverse affects.  Vital signs stable.  No complaints at this time.  Discharge from clinic ambulatory in stable condition.  Alert and oriented X 3.  Follow up with [facility] as scheduled.  

## 2024-06-11 ENCOUNTER — Encounter: Payer: Self-pay | Admitting: Oncology

## 2024-06-13 ENCOUNTER — Other Ambulatory Visit: Payer: Self-pay

## 2024-06-13 MED ORDER — TRAMADOL HCL 50 MG PO TABS: 50.0000 mg | ORAL_TABLET | Freq: Three times a day (TID) | ORAL | 0 refills | Status: DC | PRN

## 2024-06-17 ENCOUNTER — Inpatient Hospital Stay: Attending: Hematology

## 2024-06-17 ENCOUNTER — Inpatient Hospital Stay: Attending: Hematology | Admitting: Oncology

## 2024-06-17 ENCOUNTER — Other Ambulatory Visit: Payer: Self-pay

## 2024-06-17 DIAGNOSIS — Z79899 Other long term (current) drug therapy: Secondary | ICD-10-CM

## 2024-06-17 DIAGNOSIS — D61818 Other pancytopenia: Secondary | ICD-10-CM

## 2024-06-17 DIAGNOSIS — R21 Rash and other nonspecific skin eruption: Secondary | ICD-10-CM

## 2024-06-17 DIAGNOSIS — E611 Iron deficiency: Secondary | ICD-10-CM

## 2024-06-17 DIAGNOSIS — T50905A Adverse effect of unspecified drugs, medicaments and biological substances, initial encounter: Secondary | ICD-10-CM

## 2024-06-17 DIAGNOSIS — E538 Deficiency of other specified B group vitamins: Secondary | ICD-10-CM

## 2024-06-17 DIAGNOSIS — R600 Localized edema: Secondary | ICD-10-CM

## 2024-06-17 DIAGNOSIS — C4371 Malignant melanoma of right lower limb, including hip: Secondary | ICD-10-CM

## 2024-06-17 DIAGNOSIS — E032 Hypothyroidism due to medicaments and other exogenous substances: Secondary | ICD-10-CM

## 2024-06-17 LAB — COMPREHENSIVE METABOLIC PANEL WITH GFR
ALT: 14 U/L (ref 0–44)
AST: 36 U/L (ref 15–41)
Albumin: 3.5 g/dL (ref 3.5–5.0)
Alkaline Phosphatase: 139 U/L — ABNORMAL HIGH (ref 38–126)
Anion gap: 13 (ref 5–15)
BUN: 13 mg/dL (ref 8–23)
CO2: 24 mmol/L (ref 22–32)
Calcium: 8.8 mg/dL — ABNORMAL LOW (ref 8.9–10.3)
Chloride: 95 mmol/L — ABNORMAL LOW (ref 98–111)
Creatinine, Ser: 0.66 mg/dL (ref 0.44–1.00)
GFR, Estimated: >60 mL/min
Glucose, Bld: 135 mg/dL — ABNORMAL HIGH (ref 70–99)
Potassium: 3.7 mmol/L (ref 3.5–5.1)
Sodium: 132 mmol/L — ABNORMAL LOW (ref 135–145)
Total Bilirubin: 0.6 mg/dL (ref 0.0–1.2)
Total Protein: 6.6 g/dL (ref 6.5–8.1)

## 2024-06-17 LAB — URINALYSIS, ROUTINE W REFLEX MICROSCOPIC
Bilirubin Urine: NEGATIVE
Glucose, UA: NEGATIVE mg/dL
Hgb urine dipstick: NEGATIVE
Ketones, ur: NEGATIVE mg/dL
Nitrite: NEGATIVE
Protein, ur: 30 mg/dL — AB
Specific Gravity, Urine: 1.016 (ref 1.005–1.030)
pH: 5 (ref 5.0–8.0)

## 2024-06-17 LAB — CBC WITH DIFFERENTIAL/PLATELET
Abs Immature Granulocytes: 0.02 10*3/uL (ref 0.00–0.07)
Basophils Absolute: 0 10*3/uL (ref 0.0–0.1)
Basophils Relative: 0 %
Eosinophils Absolute: 0.1 10*3/uL (ref 0.0–0.5)
Eosinophils Relative: 1 %
HCT: 35.9 % — ABNORMAL LOW (ref 36.0–46.0)
Hemoglobin: 11.4 g/dL — ABNORMAL LOW (ref 12.0–15.0)
Immature Granulocytes: 0 %
Lymphocytes Relative: 8 %
Lymphs Abs: 0.4 10*3/uL — ABNORMAL LOW (ref 0.7–4.0)
MCH: 30.6 pg (ref 26.0–34.0)
MCHC: 31.8 g/dL (ref 30.0–36.0)
MCV: 96.2 fL (ref 80.0–100.0)
Monocytes Absolute: 0.6 10*3/uL (ref 0.1–1.0)
Monocytes Relative: 12 %
Neutro Abs: 3.8 10*3/uL (ref 1.7–7.7)
Neutrophils Relative %: 79 %
Platelets: 140 10*3/uL — ABNORMAL LOW (ref 150–400)
RBC: 3.73 MIL/uL — ABNORMAL LOW (ref 3.87–5.11)
RDW: 16.4 % — ABNORMAL HIGH (ref 11.5–15.5)
WBC: 4.8 10*3/uL (ref 4.0–10.5)
nRBC: 0 % (ref 0.0–0.2)

## 2024-06-17 LAB — CK: Total CK: 41 U/L (ref 38–234)

## 2024-06-17 LAB — MAGNESIUM: Magnesium: 1.7 mg/dL (ref 1.7–2.4)

## 2024-06-17 MED ORDER — TRAMADOL HCL 50 MG PO TABS: 50.0000 mg | ORAL_TABLET | Freq: Four times a day (QID) | ORAL | 0 refills | Status: AC | PRN

## 2024-06-17 MED ORDER — PREDNISONE 10 MG PO TABS: 10.0000 mg | ORAL_TABLET | Freq: Every day | ORAL | 0 refills | Status: AC

## 2024-06-17 NOTE — Progress Notes (Signed)
 " Patient Care Team: [name] LABOR, MD as PCP - General (Internal Medicine) Elmira Newman PARAS, MD as PCP - Cardiology (Cardiology) Celestia Joesph SQUIBB, RN as Oncology Nurse Navigator (Oncology)  Clinic Day:  05/09/2024  Referring physician: [name] LABOR, MD   CHIEF COMPLAINT:  CC: Metastatic malignant melanoma   ASSESSMENT & PLAN:   Assessment & Plan: Elizabeth Norman  is a 84 y.o. female with metastatic malignant melanoma  Malignant melanoma of the lower leg, right  Metastatic malignant melanoma initially diagnosed in 2022.  Extensive oncology history below S/p wide local excision and lymph node biopsy.  Adjuvant therapy with Opdualag  resulting in myocarditis requiring hospital admission and hence was discontinued Received radiation to femoral lesion and inguinal lymph node. NGS consistent with negative BRAF, positive NRAS and positive PTEN Second line: Pembrolizumab  Third line: Ipilimumab  added for progression along with pembrolizumab . Patient was started on binimetinib  30mg  twice daily on 02/03/2024 and PET scan after that showed good response. Complicated by diffuse edema and rash.  - Restart Binimetinib  at 15mg  twice a day. Considering patients age and co morbidities and with limited options for metastatic melanoma, I would like to try lower dose prior to changing to other treatments - Echocardiogram with good LVEF. Has follow up with cardiology scheduled. Will need repeat ECHO in 2 months - Will repeat PET scan in 3 months  Return to clinic in 2 weeks to assess for tolerance  Chemotherapy-related pain Severe pain likely due to Binimetinib . Previous ibuprofen  and tramadol  trials were ineffective. Prednisone  was effective.  Approximately 40% of people on Binimetinib  can have musculoskeletal pain CK normal  - Prescribed daily prednisone  10 mg for pain control and management of rash toxicity. - Prescribed tramadol  for pain management, as ibuprofen  was ineffective. -  Ordered urine test to evaluate for possible infection or other causes of pain. - Instructed her to notify clinic if pain does not improve with current regimen. - Scheduled follow-up in two weeks to reassess pain and adjust therapy as needed.  Rash Erythematous papular rash around mouth and face. Likely secondary to binimetinib  Improved with holding binimetinib   - Continue triamcinolone  0.1% to be used twice daily as needed.  Right hip pain Likely secondary to the femoral lesion Improved today  Hypothyroidism due to medication Likely secondary to immunotherapy On levothyroxine  50 mcg daily Recent TSH on labs was normal  - Continue levothyroxine  50 mcg daily  Iron deficiency The most likely cause of her anemia is due to poor nutrition TSAT:10, Ferritin: 40 S/p IV iron  -Continue oral iron every other day.  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  She knows to contact our office if she develops concerns prior to her next appointment.  The total time spent in the appointment was 27 minutes for the encounter with patient, including review of chart and various tests results, discussions about plan of care and coordination of care plan   Mickiel Dry, MD  [facility] [place] - [facility] OF [facility]  [name] [address] [place] [address] Dept: [phone] Dept Fax: [phone]   No orders of the defined types  were placed in this encounter.    ONCOLOGY HISTORY:   I have reviewed her chart and materials related to her cancer extensively and collaborated history with the patient. Summary of oncologic history is as follows:   Diagnosis: Malignant melanoma of the right posterior leg (pT4 pN3 M1), BRAF V600 negative   -Presentation: fleshy lesion on right leg -12/2020: Right lower leg lesion: Biopsy: Malignant melanoma, nodular subtype, Breslow thickness 9.52 mm, no satellitosis, no ulceration, no LVI.  Deep margin was positive.  There were 2 mitosis per millimeter squared.  Path was pT4a. ( Per documentation) -02/12/2021: Wide local lesion excision and lymph node biopsy.  Pathology: Residual malignant melanoma, 7.0 mm. Margins free of melanoma. No lymphovascular invasion present. Three of three positive sentinel lymph nodes for melanoma (3/3). Pathologic staging: pT4a, pN3. MelanA and Sox-10 highlight the melanoma.  -03/22/2021: Initial PET: Three foci of intense metabolic activity within the RIGHT lower extremity consistent with metastatic melanoma. Lesions involve the subcutaneous tissue, intramedullary bone of the RIGHT femur as well as soft tissue activity in the RIGHT condylar notch. Focus of metabolic activity adjacent to the RIGHT iliac wing is indeterminate differential including metastatic melanoma versus trauma. No evidence of visceral metastasis in the chest abdomen pelvis. Focus of intense radiotracer activity at the angle of the RIGHT jaw is favored to localize within the RIGHT parotid gland. Favor primary parotid neoplasm. -04/05/2021: Port insertion -04/10/2021: MRI brain: No intracranial metastatic disease. No acute intracranial abnormality. -04/15/2021: MRI of right hip and right knee: Solitary osseous metastasis posteriorly in the distal right femoral diaphysis. Multiple nodal metastases anteromedially in the proximal to mid right thigh. The hypermetabolic activity in the right knee  intercondylar notch on prior PET-CT shows no clear corresponding enhancing lesion and may relate to a small synovial cyst or inflammation. -04/19/2021:[facility] NGS: BRAF negative, positive for NRAS pathogenic variant exon 3, TERT promoter.  MSI-stable.  MMR-proficient.  NTRK 1/2/3 fusion not detected.  KIT mutation negative.  PD-L1 negative.  -05/09/2021: Nivolumab -Relatlimab  (Opdualag ) every 28 days, discontinued after first dose due to myocarditis -05/2021-07/2021: Off treatment -07/25/2021: PET: Hypermetabolic lymph nodes in the LEFT groin adjacent to lymphadenectomy clips consistent with melanoma recurrence. Hypermetabolic soft tissue nodule in the subcutaneous tissue of the medial RIGHT thigh consistent with melanoma. Hypermetabolic nodule within the medullary space of the midshaft RIGHT femur consistent skeletal metastasis. No evidence of metastatic melanoma above the RIGHT groin lesions. -09/03/2021-01/13/2023: Pembrolizumab    -02/13/2022: PET:  Interval recurrence of FDG avid lesion within the mid shaft of the right femur. Interval increase in size and degree of FDG uptake associated with soft tissue nodule within the medial right thigh. Similar appearance of tracer avid nodule within the deep right parotid gland. This is favored to represent a primary parotid neoplasm. -03/26/2022-04/09/2022: Radiation therapy to soft tissue nodule within right medial thigh and midshaft right femoral lesion  -12/2022: Guardant360: NRAS Q61R (? Binimetinib ), TP 53 C17 6Y, TERT promoter SNV, TMB 20.8, MSI high not detected.  -01/01/2023: PET: Postsurgical changes in the medial right calf. Right femoral osseous metastasis and metastatic lesion/node in the medial right thigh, grossly unchanged. New right pelvic nodal metastases, reflecting progression of disease. -01/14/2023-current: Pembrolizumab  with low-dose ipilimumab   -05/22/2023: PET: There are 2 soft tissue nodules within the right upper quadrant of the abdomen  which are tracer avid, new from the  previous PET-CT and new from the CT from 03/30/2023. Indeterminate. Peritoneal metastasis cannot be excluded. Multiple tracer avid right-sided pelvic lymph nodes are identified compatible with nodal metastasis. These are new compared with the previous PET-CT from 01/01/2023. There is a new focal area of increased radiotracer uptake within the anterior inferior aspect of the L3 vertebral body. No corresponding CT abnormality identified. Findings are concerning for osseous metastasis. Stable appearance of tracer avid lesion within the distal diaphysis of the right femur. Stable appearance of soft tissue nodule within the medial right thigh at the site of treated melanoma. -06/09/2023: Guardant360: NRAS Q61R (binimetinib ), PTEN deletion, exon 3-9 (Capivasertib), PTEN deletion exon 1-6 (Capivasertib), TP 53, TERT promoter SNV  -07/23/2023: PET: Somewhat mixed response to therapy with enlarging and increasingly hypermetabolic right inguinal adenopathy and a new L5 metastasis. Stable or decreased hypermetabolism involving right upper quadrant peritoneal nodules, right iliac chain adenopathy, L5 and right femoral metastases and a treated soft tissue lesion in the medial right thigh. Decreasing hypermetabolism in the gallbladder fossa, possibly postoperative in etiology. Hypermetabolic medial right parotid nodule, stable.  -08/31/2023: Radiation therapy to right inguinal lymph node -01/28/2024: PET: Mixed response to therapy. Interval increase in size and metabolic activity of lesion in the proximal RIGHT humerus. Interval increase in size and metabolic activity of lesion in the proximal RIGHT femur. New hypermetabolic lesions in the L1 and T11 vertebral bodies. Stable hypermetabolic lesion in the L3 vertebral body. Stable hypermetabolic lesion in the RIGHT chest wall. Stable hypermetabolic lesion in the RIGHT external iliac and inguinal lymph nodes. Stable hypermetabolic lesion in  the RIGHT medial thigh. Decrease in metabolic activity of gallbladder lesion. - 02/03/2024-current: Binimetinib  30 mg twice daily  -Held briefly for edema and rash  -05/26/2024: Restarted at a low dose- 15mg  twice daily -03/31/2024: PET: Interval response to therapy with decreased metabolic activity associated with previously demonstrated osseous and soft tissue metastases. There is persistent intense hypermetabolic activity within the distal right femoral lesion. No definite new or enlarging metastases identified. New prominent vascular activity within the left brachial, axillary and subclavian veins with a new nearby focal hypermetabolic activity inferiorly in the left axilla, favored to reflect a venous collateral (rather than a lymph node).  Current Treatment:  Binimetinib  15mg  twice daily   INTERVAL HISTORY:   Discussed the use of AI scribe software for clinical note transcription with the patient, who gave verbal consent to proceed.  History of Present Illness Elizabeth Norman is an 84 year old female with metastatic malignant melanoma who presents with worsening pain.  She is accompanied by her son today.  Over the past two weeks, she has experienced worsening pain described as cramping from her sides to her shoulders, radiating across her back and occasionally down her leg. The pain is persistent, severe, and has resulted in significant difficulty standing, decreased appetite, and overall decline in well-being. There is no associated rash, dysuria, or hematuria.  Ibuprofen  800 mg provided no relief despite scheduled dosing. Tramadol  has offered only partial analgesia and is insufficient for ongoing pain control. She has also used Tylenol  PM at night and arthritis-strength medication for chronic knee pain. The pain intensified following recent iron infusions and after holding binimetinib  as previously instructed due to pain. She has since restarted binimetinib  at a reduced dose. Previous courses  of prednisone  and antibiotics for rash were associated with improvement in pain.  She remains adherent to vitamin B12 supplementation. A urine test was ordered to rule out other etiologies of pain, though  she denies urinary symptoms.    I have reviewed the past medical history, past surgical history, social history and family history with the patient and they are unchanged from previous note.  ALLERGIES:  is allergic to tape.  MEDICATIONS:  Current Outpatient Medications  Medication Sig Dispense Refill   ALPRAZolam  (XANAX ) 0.5 MG tablet Take 0.5 mg by mouth 3 (three) times daily.  2   binimetinib  (MEKTOVI ) 15 MG tablet Take 2 tablets (30 mg total) by mouth 2 (two) times daily. 120 tablet 2   calcium  carbonate (OSCAL) 1500 (600 Ca) MG TABS tablet Take 600 mg of elemental calcium  by mouth 3 (three) times a week. (Patient taking differently: Take 600 mg of elemental calcium  by mouth 3 (three) times a week. Takes once a month)     cholecalciferol (VITAMIN D) 1000 UNITS tablet Take 1,000 Units by mouth 3 (three) times a week.     Cyanocobalamin  (VITAMIN B-12 PO) Take 1 tablet by mouth once a week.     diphenhydramine -acetaminophen  (TYLENOL  PM) 25-500 MG TABS tablet Take 1 tablet by mouth at bedtime as needed.     doxycycline  (VIBRA -TABS) 100 MG tablet Take 1 tablet (100 mg total) by mouth 2 (two) times daily. 180 tablet 2   furosemide  (LASIX ) 20 MG tablet Take 1 tablet (20 mg total) by mouth daily for 5 days. 30 tablet 0   ibuprofen  (ADVIL ) 800 MG tablet Take 1 tablet (800 mg total) by mouth every 8 (eight) hours as needed. 45 tablet 0   levothyroxine  (SYNTHROID ) 125 MCG tablet Take 1 tablet (125 mcg total) by mouth daily before breakfast. 30 tablet 3   metoprolol  tartrate (LOPRESSOR ) 25 MG tablet Take 1 tablet (25 mg total) by mouth 2 (two) times daily. Hold until follow-up with your doctor/cardiologist     miconazole (MICOTIN) 2 % cream Apply 1 Application topically 2 (two) times daily.      ondansetron  (ZOFRAN -ODT) 4 MG disintegrating tablet Take 1 tablet (4 mg total) by mouth every 6 (six) hours as needed for nausea. 20 tablet 0   prednisoLONE  acetate (PRED FORTE ) 1 % ophthalmic suspension Place 1 drop into both eyes daily.     predniSONE  (DELTASONE ) 10 MG tablet Take 1 tablet (10 mg total) by mouth daily with breakfast. 90 tablet 0   prochlorperazine  (COMPAZINE ) 10 MG tablet Take 1 tablet (10 mg total) by mouth every 6 (six) hours as needed for nausea or vomiting. 90 tablet 3   traMADol  (ULTRAM ) 50 MG tablet Take 1 tablet (50 mg total) by mouth every 8 (eight) hours as needed. 30 tablet 0   triamcinolone  cream (KENALOG ) 0.1 % Apply 1 Application topically 2 (two) times daily. 60 g 10   No current facility-administered medications for this visit.    REVIEW OF SYSTEMS:   All the systems were reviewed with the patient and are negative except HPI   VITALS:  There were no vitals taken for this visit.  Wt Readings from Last 3 Encounters:  05/26/24 160 lb 14.4 oz (73 kg)  05/09/24 167 lb (75.8 kg)  05/02/24 167 lb 15.9 oz (76.2 kg)    There is no height or weight on file to calculate BMI.  Performance status (ECOG): 2 - Symptomatic, <50% confined to bed  PHYSICAL EXAM:   GENERAL:alert, no distress and comfortable SKIN: Rash significantly improved   LYMPH: Palpable right inguinal lymphadenopathy. LUNGS: clear to auscultation and percussion with normal breathing effort HEART: regular rate & rhythm and no murmurs and  no lower extremity edema ABDOMEN:abdomen soft, non-tender and normal bowel sounds Musculoskeletal: Induration in the right thigh 5 into 3 cm in diameter  NEURO: alert & oriented x 3 with fluent speech  LABORATORY DATA:  I have reviewed the data as listed   Lab Results  Component Value Date   WBC 5.4 05/18/2024   NEUTROABS 4.5 05/18/2024   HGB 10.3 (L) 05/18/2024   HCT 33.7 (L) 05/18/2024   MCV 96.8 05/18/2024   PLT 145 (L) 05/18/2024       Chemistry      Component Value Date/Time   NA 139 05/18/2024 0942   K 3.6 05/18/2024 0942   CL 101 05/18/2024 0942   CO2 25 05/18/2024 0942   BUN 16 05/18/2024 0942   CREATININE 0.68 05/18/2024 0942      Component Value Date/Time   CALCIUM  8.6 (L) 05/18/2024 0942   ALKPHOS 108 05/18/2024 0942   AST 22 05/18/2024 0942   ALT 9 05/18/2024 0942   BILITOT 0.5 05/18/2024 0942      Latest Reference Range & Units 05/18/24 09:42  Iron 28 - 170 ug/dL 29  UIBC ug/dL 724  TIBC 749 - 549 ug/dL 695  Saturation Ratios 10.4 - 31.8 % 10 (L)  Ferritin 11 - 307 ng/mL 40  Folate >5.9 ng/mL 11.2  Vitamin B12 180 - 914 pg/mL 245  (L): Data is abnormally low   Latest Reference Range & Units 05/18/24 09:42  TSH 0.350 - 4.500 uIU/mL 2.450  T4,Free(Direct) 0.80 - 2.00 ng/dL 8.31   RADIOGRAPHIC STUDIES: I have personally reviewed the radiological images as listed and agreed with the findings in the report.  "

## 2024-06-17 NOTE — Progress Notes (Signed)
 Per Dr. Davonna verbal order. Collect and send urine for UA and C+S. Urine sent down.

## 2024-06-30 ENCOUNTER — Inpatient Hospital Stay: Admitting: Oncology

## 2024-06-30 ENCOUNTER — Inpatient Hospital Stay

## 2024-07-05 ENCOUNTER — Other Ambulatory Visit (HOSPITAL_COMMUNITY)

## 2024-07-07 ENCOUNTER — Other Ambulatory Visit (HOSPITAL_COMMUNITY)

## 2024-07-07 ENCOUNTER — Inpatient Hospital Stay

## 2024-07-14 ENCOUNTER — Inpatient Hospital Stay: Attending: Hematology | Admitting: Oncology
